# Patient Record
Sex: Female | Born: 1965 | Hispanic: Yes | Marital: Married | Smoking: Never smoker
Health system: Southern US, Community
[De-identification: ages and names within clinical notes are randomized; demographics above are authoritative.]

## PROBLEM LIST (undated history)

## (undated) DIAGNOSIS — R6882 Decreased libido: Secondary | ICD-10-CM

## (undated) DIAGNOSIS — D649 Anemia, unspecified: Secondary | ICD-10-CM

## (undated) DIAGNOSIS — Z5189 Encounter for other specified aftercare: Secondary | ICD-10-CM

## (undated) DIAGNOSIS — E785 Hyperlipidemia, unspecified: Secondary | ICD-10-CM

## (undated) DIAGNOSIS — R42 Dizziness and giddiness: Secondary | ICD-10-CM

## (undated) DIAGNOSIS — J309 Allergic rhinitis, unspecified: Secondary | ICD-10-CM

## (undated) DIAGNOSIS — G4700 Insomnia, unspecified: Secondary | ICD-10-CM

## (undated) DIAGNOSIS — E119 Type 2 diabetes mellitus without complications: Secondary | ICD-10-CM

## (undated) DIAGNOSIS — G43009 Migraine without aura, not intractable, without status migrainosus: Secondary | ICD-10-CM

## (undated) DIAGNOSIS — J45909 Unspecified asthma, uncomplicated: Secondary | ICD-10-CM

## (undated) DIAGNOSIS — K219 Gastro-esophageal reflux disease without esophagitis: Secondary | ICD-10-CM

## (undated) HISTORY — PX: CYST EXCISION: SHX5701

## (undated) HISTORY — DX: Dizziness and giddiness: R42

## (undated) HISTORY — DX: Insomnia, unspecified: G47.00

## (undated) HISTORY — DX: Encounter for other specified aftercare: Z51.89

## (undated) HISTORY — DX: Decreased libido: R68.82

## (undated) HISTORY — DX: Anemia, unspecified: D64.9

## (undated) HISTORY — DX: Migraine without aura, not intractable, without status migrainosus: G43.009

## (undated) HISTORY — DX: Allergic rhinitis, unspecified: J30.9

## (undated) HISTORY — DX: Hyperlipidemia, unspecified: E78.5

## (undated) HISTORY — DX: Gastro-esophageal reflux disease without esophagitis: K21.9

## (undated) HISTORY — DX: Unspecified asthma, uncomplicated: J45.909

## (undated) HISTORY — DX: Type 2 diabetes mellitus without complications: E11.9

---

## 2007-01-10 ENCOUNTER — Emergency Department (HOSPITAL_COMMUNITY): Admission: EM | Admit: 2007-01-10 | Discharge: 2007-01-10 | Payer: Self-pay | Admitting: Emergency Medicine

## 2007-03-24 ENCOUNTER — Emergency Department (HOSPITAL_COMMUNITY): Admission: EM | Admit: 2007-03-24 | Discharge: 2007-03-24 | Payer: Self-pay | Admitting: Emergency Medicine

## 2007-05-29 ENCOUNTER — Other Ambulatory Visit: Admission: RE | Admit: 2007-05-29 | Discharge: 2007-05-29 | Payer: Self-pay | Admitting: Gynecology

## 2007-05-31 LAB — CONVERTED CEMR LAB: Pap Smear: NORMAL

## 2007-06-19 ENCOUNTER — Encounter (INDEPENDENT_AMBULATORY_CARE_PROVIDER_SITE_OTHER): Payer: Self-pay | Admitting: *Deleted

## 2007-06-19 ENCOUNTER — Ambulatory Visit (HOSPITAL_COMMUNITY): Admission: RE | Admit: 2007-06-19 | Discharge: 2007-06-19 | Payer: Self-pay | Admitting: *Deleted

## 2007-06-21 ENCOUNTER — Encounter: Admission: RE | Admit: 2007-06-21 | Discharge: 2007-06-21 | Payer: Self-pay | Admitting: Gynecology

## 2007-07-27 ENCOUNTER — Ambulatory Visit: Payer: Self-pay | Admitting: Internal Medicine

## 2007-07-27 LAB — CONVERTED CEMR LAB
ALT: 18 units/L (ref 0–35)
AST: 21 units/L (ref 0–37)
Albumin: 4.1 g/dL (ref 3.5–5.2)
Alkaline Phosphatase: 51 units/L (ref 39–117)
BUN: 10 mg/dL (ref 6–23)
Basophils Absolute: 0 10*3/uL (ref 0.0–0.1)
Basophils Relative: 0 % (ref 0.0–1.0)
Bilirubin Urine: NEGATIVE
Bilirubin, Direct: 0.1 mg/dL (ref 0.0–0.3)
CO2: 29 meq/L (ref 19–32)
Calcium: 9.3 mg/dL (ref 8.4–10.5)
Chloride: 104 meq/L (ref 96–112)
Cholesterol: 208 mg/dL (ref 0–200)
Creatinine, Ser: 0.6 mg/dL (ref 0.4–1.2)
Crystals: NEGATIVE
Direct LDL: 138.6 mg/dL
Eosinophils Absolute: 0.1 10*3/uL (ref 0.0–0.6)
Eosinophils Relative: 1.7 % (ref 0.0–5.0)
GFR calc Af Amer: 140 mL/min
GFR calc non Af Amer: 116 mL/min
Glucose, Bld: 122 mg/dL — ABNORMAL HIGH (ref 70–99)
HCT: 35 % — ABNORMAL LOW (ref 36.0–46.0)
HDL: 45.5 mg/dL (ref >39.0)
Hemoglobin, Urine: NEGATIVE
Hemoglobin: 11.6 g/dL — ABNORMAL LOW (ref 12.0–15.0)
Ketones, ur: NEGATIVE mg/dL
Lymphocytes Relative: 54.3 % — ABNORMAL HIGH (ref 12.0–46.0)
MCHC: 33 g/dL (ref 30.0–36.0)
MCV: 80.7 fL (ref 78.0–100.0)
Monocytes Absolute: 0.7 10*3/uL (ref 0.2–0.7)
Monocytes Relative: 9.6 % (ref 3.0–11.0)
Mucus, UA: NEGATIVE
Neutro Abs: 2.5 10*3/uL (ref 1.4–7.7)
Neutrophils Relative %: 34.4 % — ABNORMAL LOW (ref 43.0–77.0)
Nitrite: NEGATIVE
Platelets: 363 10*3/uL (ref 150–400)
Potassium: 3.8 meq/L (ref 3.5–5.1)
RBC: 4.34 M/uL (ref 3.87–5.11)
RDW: 12.9 % (ref 11.5–14.6)
Sodium: 139 meq/L (ref 135–145)
Specific Gravity, Urine: 1.02 (ref 1.000–1.03)
TSH: 0.82 microintl units/mL (ref 0.35–5.50)
Total Bilirubin: 1 mg/dL (ref 0.3–1.2)
Total CHOL/HDL Ratio: 4.6
Total Protein, Urine: NEGATIVE mg/dL
Total Protein: 7.1 g/dL (ref 6.0–8.3)
Triglycerides: 95 mg/dL (ref 0–149)
Urine Glucose: NEGATIVE mg/dL
Urobilinogen, UA: 0.2 (ref 0.0–1.0)
VLDL: 19 mg/dL (ref 0–40)
WBC: 7.2 10*3/uL (ref 4.5–10.5)
pH: 6 (ref 5.0–8.0)

## 2007-08-02 ENCOUNTER — Ambulatory Visit: Payer: Self-pay | Admitting: Internal Medicine

## 2007-08-02 DIAGNOSIS — N390 Urinary tract infection, site not specified: Secondary | ICD-10-CM | POA: Insufficient documentation

## 2007-08-02 DIAGNOSIS — K219 Gastro-esophageal reflux disease without esophagitis: Secondary | ICD-10-CM

## 2007-08-02 DIAGNOSIS — Z794 Long term (current) use of insulin: Secondary | ICD-10-CM

## 2007-08-02 DIAGNOSIS — J45909 Unspecified asthma, uncomplicated: Secondary | ICD-10-CM

## 2007-08-02 DIAGNOSIS — E785 Hyperlipidemia, unspecified: Secondary | ICD-10-CM

## 2007-08-02 DIAGNOSIS — E119 Type 2 diabetes mellitus without complications: Secondary | ICD-10-CM

## 2007-08-02 DIAGNOSIS — E1165 Type 2 diabetes mellitus with hyperglycemia: Secondary | ICD-10-CM

## 2007-08-02 HISTORY — DX: Unspecified asthma, uncomplicated: J45.909

## 2007-08-02 HISTORY — DX: Type 2 diabetes mellitus with hyperglycemia: E11.65

## 2007-08-02 HISTORY — DX: Hyperlipidemia, unspecified: E78.5

## 2007-08-02 HISTORY — DX: Gastro-esophageal reflux disease without esophagitis: K21.9

## 2007-08-02 HISTORY — DX: Type 2 diabetes mellitus without complications: E11.9

## 2007-08-02 HISTORY — DX: Long term (current) use of insulin: Z79.4

## 2007-08-22 ENCOUNTER — Emergency Department (HOSPITAL_COMMUNITY): Admission: EM | Admit: 2007-08-22 | Discharge: 2007-08-22 | Payer: Self-pay | Admitting: Emergency Medicine

## 2007-09-29 ENCOUNTER — Ambulatory Visit: Payer: Self-pay | Admitting: Internal Medicine

## 2007-09-29 LAB — CONVERTED CEMR LAB
ALT: 20 units/L (ref 0–35)
AST: 20 units/L (ref 0–37)
Cholesterol: 191 mg/dL (ref 0–200)
Creatinine,U: 82.1 mg/dL
HDL: 43.3 mg/dL (ref >39.0)
Hgb A1c MFr Bld: 6.8 % — ABNORMAL HIGH (ref 4.6–6.0)
LDL Cholesterol: 129 mg/dL — ABNORMAL HIGH (ref 0–99)
Microalb Creat Ratio: 9.7 mg/g (ref 0.0–30.0)
Microalb, Ur: 0.8 mg/dL (ref 0.0–1.9)
Total CHOL/HDL Ratio: 4.4
Total CK: 52 units/L (ref 7–177)
Triglycerides: 94 mg/dL (ref 0–149)
VLDL: 19 mg/dL (ref 0–40)

## 2007-10-04 ENCOUNTER — Ambulatory Visit: Payer: Self-pay | Admitting: Internal Medicine

## 2007-10-04 DIAGNOSIS — J309 Allergic rhinitis, unspecified: Secondary | ICD-10-CM | POA: Insufficient documentation

## 2007-10-04 HISTORY — DX: Allergic rhinitis, unspecified: J30.9

## 2007-11-10 ENCOUNTER — Emergency Department (HOSPITAL_COMMUNITY): Admission: EM | Admit: 2007-11-10 | Discharge: 2007-11-10 | Payer: Self-pay | Admitting: Emergency Medicine

## 2008-01-02 ENCOUNTER — Ambulatory Visit: Payer: Self-pay | Admitting: Internal Medicine

## 2008-01-02 LAB — CONVERTED CEMR LAB
ALT: 15 units/L (ref 0–35)
AST: 16 units/L (ref 0–37)
Cholesterol: 144 mg/dL (ref 0–200)
HDL: 41.2 mg/dL (ref >39.0)
LDL Cholesterol: 84 mg/dL (ref 0–99)
Total CHOL/HDL Ratio: 3.5
Triglycerides: 95 mg/dL (ref 0–149)
VLDL: 19 mg/dL (ref 0–40)

## 2008-01-04 ENCOUNTER — Ambulatory Visit: Payer: Self-pay | Admitting: Internal Medicine

## 2008-01-04 DIAGNOSIS — G4700 Insomnia, unspecified: Secondary | ICD-10-CM | POA: Insufficient documentation

## 2008-01-04 HISTORY — DX: Insomnia, unspecified: G47.00

## 2008-01-08 LAB — CONVERTED CEMR LAB
BUN: 10 mg/dL (ref 6–23)
CO2: 30 meq/L (ref 19–32)
Calcium: 9.2 mg/dL (ref 8.4–10.5)
Chloride: 106 meq/L (ref 96–112)
Creatinine, Ser: 0.6 mg/dL (ref 0.4–1.2)
GFR calc Af Amer: 140 mL/min
GFR calc non Af Amer: 116 mL/min
Glucose, Bld: 117 mg/dL — ABNORMAL HIGH (ref 70–99)
Hgb A1c MFr Bld: 7.1 % — ABNORMAL HIGH (ref 4.6–6.0)
Potassium: 3.8 meq/L (ref 3.5–5.1)
Sodium: 140 meq/L (ref 135–145)

## 2008-06-10 ENCOUNTER — Other Ambulatory Visit: Admission: RE | Admit: 2008-06-10 | Discharge: 2008-06-10 | Payer: Self-pay | Admitting: Gynecology

## 2008-06-10 LAB — CONVERTED CEMR LAB: Pap Smear: NORMAL

## 2008-06-21 ENCOUNTER — Emergency Department (HOSPITAL_COMMUNITY): Admission: EM | Admit: 2008-06-21 | Discharge: 2008-06-21 | Payer: Self-pay | Admitting: Emergency Medicine

## 2008-06-21 ENCOUNTER — Encounter: Admission: RE | Admit: 2008-06-21 | Discharge: 2008-06-21 | Payer: Self-pay | Admitting: Internal Medicine

## 2008-07-05 ENCOUNTER — Ambulatory Visit: Payer: Self-pay | Admitting: Internal Medicine

## 2008-07-05 LAB — CONVERTED CEMR LAB
ALT: 15 units/L (ref 0–35)
AST: 17 units/L (ref 0–37)
Albumin: 4 g/dL (ref 3.5–5.2)
Alkaline Phosphatase: 61 units/L (ref 39–117)
BUN: 10 mg/dL (ref 6–23)
Basophils Absolute: 0 10*3/uL (ref 0.0–0.1)
Basophils Relative: 0 % (ref 0.0–3.0)
Bilirubin Urine: NEGATIVE
Bilirubin, Direct: 0.1 mg/dL (ref 0.0–0.3)
CO2: 29 meq/L (ref 19–32)
Calcium: 9.4 mg/dL (ref 8.4–10.5)
Chloride: 105 meq/L (ref 96–112)
Cholesterol: 182 mg/dL (ref 0–200)
Creatinine, Ser: 0.6 mg/dL (ref 0.4–1.2)
Creatinine,U: 109.3 mg/dL
Eosinophils Absolute: 0.1 10*3/uL (ref 0.0–0.7)
Eosinophils Relative: 1.7 % (ref 0.0–5.0)
GFR calc Af Amer: 140 mL/min
GFR calc non Af Amer: 115 mL/min
Glucose, Bld: 129 mg/dL — ABNORMAL HIGH (ref 70–99)
HCT: 35 % — ABNORMAL LOW (ref 36.0–46.0)
HDL: 44.2 mg/dL (ref >39.0)
Hemoglobin, Urine: NEGATIVE
Hemoglobin: 11.6 g/dL — ABNORMAL LOW (ref 12.0–15.0)
Hgb A1c MFr Bld: 7.3 % — ABNORMAL HIGH (ref 4.6–6.0)
Iron: 73 ug/dL (ref 42–145)
Ketones, ur: NEGATIVE mg/dL
LDL Cholesterol: 118 mg/dL — ABNORMAL HIGH (ref 0–99)
Leukocytes, UA: NEGATIVE
Lymphocytes Relative: 53.2 % — ABNORMAL HIGH (ref 12.0–46.0)
MCHC: 33.1 g/dL (ref 30.0–36.0)
MCV: 81.1 fL (ref 78.0–100.0)
Microalb Creat Ratio: 6.4 mg/g (ref 0.0–30.0)
Microalb, Ur: 0.7 mg/dL (ref 0.0–1.9)
Monocytes Absolute: 0.7 10*3/uL (ref 0.1–1.0)
Monocytes Relative: 8.2 % (ref 3.0–12.0)
Neutro Abs: 3 10*3/uL (ref 1.4–7.7)
Neutrophils Relative %: 36.9 % — ABNORMAL LOW (ref 43.0–77.0)
Nitrite: NEGATIVE
Platelets: 358 10*3/uL (ref 150–400)
Potassium: 4 meq/L (ref 3.5–5.1)
RBC: 4.31 M/uL (ref 3.87–5.11)
RDW: 13.3 % (ref 11.5–14.6)
Saturation Ratios: 19.1 % — ABNORMAL LOW (ref 20.0–50.0)
Sodium: 140 meq/L (ref 135–145)
Specific Gravity, Urine: 1.01 (ref 1.000–1.03)
TSH: 1.93 microintl units/mL (ref 0.35–5.50)
Total Bilirubin: 1 mg/dL (ref 0.3–1.2)
Total CHOL/HDL Ratio: 4.1
Total Protein, Urine: NEGATIVE mg/dL
Total Protein: 6.9 g/dL (ref 6.0–8.3)
Transferrin: 273.4 mg/dL (ref >=212.0)
Triglycerides: 98 mg/dL (ref 0–149)
Urine Glucose: NEGATIVE mg/dL
Urobilinogen, UA: 0.2 (ref 0.0–1.0)
VLDL: 20 mg/dL (ref 0–40)
WBC: 8 10*3/uL (ref 4.5–10.5)
pH: 6 (ref 5.0–8.0)

## 2008-07-08 ENCOUNTER — Ambulatory Visit: Payer: Self-pay | Admitting: Internal Medicine

## 2008-07-12 ENCOUNTER — Encounter: Payer: Self-pay | Admitting: Internal Medicine

## 2008-11-27 ENCOUNTER — Emergency Department (HOSPITAL_COMMUNITY): Admission: EM | Admit: 2008-11-27 | Discharge: 2008-11-27 | Payer: Self-pay | Admitting: Family Medicine

## 2009-01-02 ENCOUNTER — Ambulatory Visit: Payer: Self-pay | Admitting: Internal Medicine

## 2009-01-02 LAB — CONVERTED CEMR LAB
BUN: 12 mg/dL (ref 6–23)
CO2: 30 meq/L (ref 19–32)
Calcium: 9 mg/dL (ref 8.4–10.5)
Chloride: 106 meq/L (ref 96–112)
Cholesterol: 240 mg/dL — ABNORMAL HIGH (ref 0–200)
Creatinine, Ser: 0.6 mg/dL (ref 0.4–1.2)
Direct LDL: 175.2 mg/dL
GFR calc non Af Amer: 115.1 mL/min (ref >60)
Glucose, Bld: 132 mg/dL — ABNORMAL HIGH (ref 70–99)
HDL: 41.7 mg/dL (ref >39.00)
Hgb A1c MFr Bld: 7.5 % — ABNORMAL HIGH (ref 4.6–6.5)
Potassium: 3.9 meq/L (ref 3.5–5.1)
Sodium: 141 meq/L (ref 135–145)
Total CHOL/HDL Ratio: 6
Triglycerides: 149 mg/dL (ref 0.0–149.0)
VLDL: 29.8 mg/dL (ref 0.0–40.0)

## 2009-01-06 ENCOUNTER — Ambulatory Visit: Payer: Self-pay | Admitting: Internal Medicine

## 2009-01-06 LAB — CONVERTED CEMR LAB
Bilirubin Urine: NEGATIVE
Hemoglobin, Urine: NEGATIVE
Ketones, ur: NEGATIVE mg/dL
Leukocytes, UA: NEGATIVE
Nitrite: NEGATIVE
Specific Gravity, Urine: 1.025 (ref 1.000–1.030)
Total Protein, Urine: NEGATIVE mg/dL
Urine Glucose: NEGATIVE mg/dL
Urobilinogen, UA: 0.2 (ref 0.0–1.0)
pH: 6 (ref 5.0–8.0)

## 2009-03-04 ENCOUNTER — Emergency Department (HOSPITAL_COMMUNITY): Admission: EM | Admit: 2009-03-04 | Discharge: 2009-03-05 | Payer: Self-pay | Admitting: Emergency Medicine

## 2009-03-10 ENCOUNTER — Telehealth: Payer: Self-pay | Admitting: Internal Medicine

## 2009-05-14 ENCOUNTER — Emergency Department (HOSPITAL_COMMUNITY): Admission: EM | Admit: 2009-05-14 | Discharge: 2009-05-14 | Payer: Self-pay | Admitting: Family Medicine

## 2009-05-15 ENCOUNTER — Telehealth: Payer: Self-pay | Admitting: Internal Medicine

## 2009-05-15 ENCOUNTER — Ambulatory Visit: Payer: Self-pay | Admitting: Internal Medicine

## 2009-05-15 DIAGNOSIS — R42 Dizziness and giddiness: Secondary | ICD-10-CM

## 2009-05-15 DIAGNOSIS — G43009 Migraine without aura, not intractable, without status migrainosus: Secondary | ICD-10-CM

## 2009-05-15 DIAGNOSIS — G43709 Chronic migraine without aura, not intractable, without status migrainosus: Secondary | ICD-10-CM | POA: Insufficient documentation

## 2009-05-15 DIAGNOSIS — H6690 Otitis media, unspecified, unspecified ear: Secondary | ICD-10-CM | POA: Insufficient documentation

## 2009-05-15 HISTORY — DX: Migraine without aura, not intractable, without status migrainosus: G43.009

## 2009-05-15 HISTORY — DX: Dizziness and giddiness: R42

## 2009-06-23 ENCOUNTER — Encounter: Admission: RE | Admit: 2009-06-23 | Discharge: 2009-06-23 | Payer: Self-pay | Admitting: Internal Medicine

## 2009-06-23 LAB — HM MAMMOGRAPHY

## 2009-07-07 ENCOUNTER — Ambulatory Visit: Payer: Self-pay | Admitting: Internal Medicine

## 2009-07-07 LAB — CONVERTED CEMR LAB
Creatinine,U: 137.8 mg/dL
Hgb A1c MFr Bld: 7.7 % — ABNORMAL HIGH (ref 4.6–6.5)
Microalb Creat Ratio: 3.6 mg/g (ref 0.0–30.0)
Microalb, Ur: 0.5 mg/dL (ref 0.0–1.9)

## 2009-07-08 ENCOUNTER — Ambulatory Visit: Payer: Self-pay | Admitting: Internal Medicine

## 2009-07-09 LAB — CONVERTED CEMR LAB
ALT: 28 units/L (ref 0–35)
AST: 53 units/L — ABNORMAL HIGH (ref 0–37)
Albumin: 4.1 g/dL (ref 3.5–5.2)
Alkaline Phosphatase: 65 units/L (ref 39–117)
BUN: 8 mg/dL (ref 6–23)
Basophils Absolute: 0 10*3/uL (ref 0.0–0.1)
Basophils Relative: 0.5 % (ref 0.0–3.0)
Bilirubin Urine: NEGATIVE
Bilirubin, Direct: 0.1 mg/dL (ref 0.0–0.3)
CO2: 29 meq/L (ref 19–32)
Calcium: 9.8 mg/dL (ref 8.4–10.5)
Chloride: 106 meq/L (ref 96–112)
Cholesterol: 168 mg/dL (ref 0–200)
Creatinine, Ser: 0.7 mg/dL (ref 0.4–1.2)
Eosinophils Absolute: 0.1 10*3/uL (ref 0.0–0.7)
Eosinophils Relative: 1.4 % (ref 0.0–5.0)
GFR calc non Af Amer: 96.12 mL/min (ref >60)
Glucose, Bld: 140 mg/dL — ABNORMAL HIGH (ref 70–99)
HCT: 35.7 % — ABNORMAL LOW (ref 36.0–46.0)
HDL: 44.9 mg/dL (ref >39.00)
Hemoglobin, Urine: NEGATIVE
Hemoglobin: 11.4 g/dL — ABNORMAL LOW (ref 12.0–15.0)
Ketones, ur: NEGATIVE mg/dL
LDL Cholesterol: 97 mg/dL (ref 0–99)
Lymphocytes Relative: 52.5 % — ABNORMAL HIGH (ref 12.0–46.0)
Lymphs Abs: 3.9 10*3/uL (ref 0.7–4.0)
MCHC: 32.1 g/dL (ref 30.0–36.0)
MCV: 81.6 fL (ref 78.0–100.0)
Monocytes Absolute: 0.7 10*3/uL (ref 0.1–1.0)
Monocytes Relative: 9.7 % (ref 3.0–12.0)
Neutro Abs: 2.6 10*3/uL (ref 1.4–7.7)
Neutrophils Relative %: 35.9 % — ABNORMAL LOW (ref 43.0–77.0)
Nitrite: NEGATIVE
Platelets: 344 10*3/uL (ref 150.0–400.0)
Potassium: 4 meq/L (ref 3.5–5.1)
RBC: 4.37 M/uL (ref 3.87–5.11)
RDW: 13.5 % (ref 11.5–14.6)
Sodium: 141 meq/L (ref 135–145)
Specific Gravity, Urine: 1.01 (ref 1.000–1.030)
TSH: 1.08 microintl units/mL (ref 0.35–5.50)
Total Bilirubin: 0.9 mg/dL (ref 0.3–1.2)
Total CHOL/HDL Ratio: 4
Total Protein, Urine: NEGATIVE mg/dL
Total Protein: 7.5 g/dL (ref 6.0–8.3)
Triglycerides: 129 mg/dL (ref 0.0–149.0)
Urine Glucose: NEGATIVE mg/dL
Urobilinogen, UA: 0.2 (ref 0.0–1.0)
VLDL: 25.8 mg/dL (ref 0.0–40.0)
WBC: 7.3 10*3/uL (ref 4.5–10.5)
pH: 7 (ref 5.0–8.0)

## 2009-07-10 ENCOUNTER — Telehealth: Payer: Self-pay | Admitting: Internal Medicine

## 2009-07-10 LAB — CONVERTED CEMR LAB
Folate: 6.1 ng/mL
Iron: 75 ug/dL (ref 42–145)
Saturation Ratios: 19.2 % — ABNORMAL LOW (ref 20.0–50.0)
Transferrin: 279.2 mg/dL (ref 212.0–360.0)
Vitamin B-12: 366 pg/mL (ref 211–911)

## 2009-07-23 ENCOUNTER — Inpatient Hospital Stay (HOSPITAL_COMMUNITY): Admission: EM | Admit: 2009-07-23 | Discharge: 2009-07-24 | Payer: Self-pay | Admitting: Emergency Medicine

## 2009-07-23 ENCOUNTER — Telehealth: Payer: Self-pay | Admitting: Internal Medicine

## 2009-07-31 ENCOUNTER — Encounter: Payer: Self-pay | Admitting: Internal Medicine

## 2009-08-21 ENCOUNTER — Emergency Department (HOSPITAL_COMMUNITY): Admission: EM | Admit: 2009-08-21 | Discharge: 2009-08-21 | Payer: Self-pay | Admitting: Family Medicine

## 2009-09-08 ENCOUNTER — Encounter: Admission: RE | Admit: 2009-09-08 | Discharge: 2009-12-07 | Payer: Self-pay | Admitting: Occupational Medicine

## 2009-12-19 ENCOUNTER — Ambulatory Visit (HOSPITAL_COMMUNITY): Admission: RE | Admit: 2009-12-19 | Discharge: 2009-12-19 | Payer: Self-pay | Admitting: Orthopedic Surgery

## 2010-01-02 ENCOUNTER — Ambulatory Visit: Payer: Self-pay | Admitting: Internal Medicine

## 2010-01-02 LAB — CONVERTED CEMR LAB
BUN: 15 mg/dL (ref 6–23)
CO2: 24 meq/L (ref 19–32)
Calcium: 9.6 mg/dL (ref 8.4–10.5)
Chloride: 103 meq/L (ref 96–112)
Cholesterol: 248 mg/dL — ABNORMAL HIGH (ref 0–200)
Creatinine, Ser: 0.6 mg/dL (ref 0.4–1.2)
Direct LDL: 176 mg/dL
GFR calc non Af Amer: 106.36 mL/min (ref >60)
Glucose, Bld: 191 mg/dL — ABNORMAL HIGH (ref 70–99)
HDL: 57.1 mg/dL (ref >39.00)
Hgb A1c MFr Bld: 9.1 % — ABNORMAL HIGH (ref 4.6–6.5)
Potassium: 4.5 meq/L (ref 3.5–5.1)
Sodium: 137 meq/L (ref 135–145)
Total CHOL/HDL Ratio: 4
Triglycerides: 60 mg/dL (ref 0.0–149.0)
VLDL: 12 mg/dL (ref 0.0–40.0)

## 2010-01-09 ENCOUNTER — Ambulatory Visit: Payer: Self-pay | Admitting: Internal Medicine

## 2010-01-09 DIAGNOSIS — R6882 Decreased libido: Secondary | ICD-10-CM | POA: Insufficient documentation

## 2010-01-09 DIAGNOSIS — F329 Major depressive disorder, single episode, unspecified: Secondary | ICD-10-CM

## 2010-01-09 DIAGNOSIS — F32A Depression, unspecified: Secondary | ICD-10-CM

## 2010-01-09 HISTORY — DX: Decreased libido: R68.82

## 2010-01-09 HISTORY — DX: Depression, unspecified: F32.A

## 2010-03-11 ENCOUNTER — Ambulatory Visit: Payer: Self-pay | Admitting: Internal Medicine

## 2010-04-03 ENCOUNTER — Telehealth: Payer: Self-pay | Admitting: Internal Medicine

## 2010-04-21 ENCOUNTER — Emergency Department (HOSPITAL_COMMUNITY): Admission: EM | Admit: 2010-04-21 | Discharge: 2010-04-21 | Payer: Self-pay | Admitting: Family Medicine

## 2010-04-29 ENCOUNTER — Encounter: Payer: Self-pay | Admitting: Internal Medicine

## 2010-04-29 ENCOUNTER — Encounter
Admission: RE | Admit: 2010-04-29 | Discharge: 2010-07-14 | Payer: Self-pay | Source: Home / Self Care | Attending: Internal Medicine | Admitting: Internal Medicine

## 2010-06-24 ENCOUNTER — Encounter
Admission: RE | Admit: 2010-06-24 | Discharge: 2010-06-24 | Payer: Self-pay | Source: Home / Self Care | Attending: Obstetrics and Gynecology | Admitting: Obstetrics and Gynecology

## 2010-07-13 ENCOUNTER — Other Ambulatory Visit: Payer: Self-pay | Admitting: Internal Medicine

## 2010-07-13 ENCOUNTER — Ambulatory Visit
Admission: RE | Admit: 2010-07-13 | Discharge: 2010-07-13 | Payer: Self-pay | Source: Home / Self Care | Attending: Internal Medicine | Admitting: Internal Medicine

## 2010-07-13 LAB — BASIC METABOLIC PANEL
BUN: 10 mg/dL (ref 6–23)
CO2: 27 mEq/L (ref 19–32)
Calcium: 9.1 mg/dL (ref 8.4–10.5)
Chloride: 99 mEq/L (ref 96–112)
Creatinine, Ser: 0.5 mg/dL (ref 0.4–1.2)
GFR: 137.9 mL/min (ref >60.00)
Glucose, Bld: 164 mg/dL — ABNORMAL HIGH (ref 70–99)
Potassium: 4.3 mEq/L (ref 3.5–5.1)
Sodium: 135 mEq/L (ref 135–145)

## 2010-07-13 LAB — HEPATIC FUNCTION PANEL
ALT: 17 U/L (ref 0–35)
AST: 18 U/L (ref 0–37)
Albumin: 4 g/dL (ref 3.5–5.2)
Alkaline Phosphatase: 67 U/L (ref 39–117)
Bilirubin, Direct: 0.1 mg/dL (ref 0.0–0.3)
Total Bilirubin: 0.9 mg/dL (ref 0.3–1.2)
Total Protein: 7.2 g/dL (ref 6.0–8.3)

## 2010-07-13 LAB — CBC WITH DIFFERENTIAL/PLATELET
Basophils Absolute: 0.1 10*3/uL (ref 0.0–0.1)
Basophils Relative: 0.7 % (ref 0.0–3.0)
Eosinophils Absolute: 0.1 10*3/uL (ref 0.0–0.7)
Eosinophils Relative: 1.1 % (ref 0.0–5.0)
HCT: 34 % — ABNORMAL LOW (ref 36.0–46.0)
Hemoglobin: 11.4 g/dL — ABNORMAL LOW (ref 12.0–15.0)
Lymphocytes Relative: 50.5 % — ABNORMAL HIGH (ref 12.0–46.0)
Lymphs Abs: 4.2 10*3/uL — ABNORMAL HIGH (ref 0.7–4.0)
MCHC: 33.5 g/dL (ref 30.0–36.0)
MCV: 78.9 fl (ref 78.0–100.0)
Monocytes Absolute: 0.6 10*3/uL (ref 0.1–1.0)
Monocytes Relative: 7.3 % (ref 3.0–12.0)
Neutro Abs: 3.4 10*3/uL (ref 1.4–7.7)
Neutrophils Relative %: 40.4 % — ABNORMAL LOW (ref 43.0–77.0)
Platelets: 393 10*3/uL (ref 150.0–400.0)
RBC: 4.31 Mil/uL (ref 3.87–5.11)
RDW: 14.3 % (ref 11.5–14.6)
WBC: 8.4 10*3/uL (ref 4.5–10.5)

## 2010-07-13 LAB — TSH: TSH: 1.2 u[IU]/mL (ref 0.35–5.50)

## 2010-07-13 LAB — URINALYSIS
Bilirubin Urine: NEGATIVE
Hemoglobin, Urine: NEGATIVE
Ketones, ur: NEGATIVE
Leukocytes, UA: NEGATIVE
Nitrite: NEGATIVE
Specific Gravity, Urine: 1.015 (ref 1.000–1.030)
Total Protein, Urine: NEGATIVE
Urine Glucose: 100
Urobilinogen, UA: 0.2 (ref 0.0–1.0)
pH: 7.5 (ref 5.0–8.0)

## 2010-07-13 LAB — LIPID PANEL
Cholesterol: 205 mg/dL — ABNORMAL HIGH (ref 0–200)
HDL: 46.4 mg/dL (ref >39.00)
Total CHOL/HDL Ratio: 4
Triglycerides: 156 mg/dL — ABNORMAL HIGH (ref 0.0–149.0)
VLDL: 31.2 mg/dL (ref 0.0–40.0)

## 2010-07-13 LAB — LDL CHOLESTEROL, DIRECT: Direct LDL: 139.8 mg/dL

## 2010-07-13 LAB — HEMOGLOBIN A1C: Hgb A1c MFr Bld: 9.1 % — ABNORMAL HIGH (ref 4.6–6.5)

## 2010-07-13 LAB — MICROALBUMIN / CREATININE URINE RATIO
Creatinine,U: 64.1 mg/dL
Microalb Creat Ratio: 0.5 mg/g (ref 0.0–30.0)
Microalb, Ur: 0.3 mg/dL (ref 0.0–1.9)

## 2010-07-15 NOTE — Letter (Signed)
Summary: [facility]   Imported By: Lester Lemont 05/05/2010 08:29:15  _____________________________________________________________________  External Attachment:    Type:   Image     Comment:   External Document

## 2010-07-15 NOTE — Progress Notes (Signed)
Summary: Wellness Program  Phone Note Outgoing Call   Call placed by: Zella Ball Summary of Call: Patient has voluntarily enrolled in [facility] Program through [facility] for its employees. Patient will meet one on one with Dr. Gerilyn Pilgrim for nutritional counseling, but they need written order to ok this from pts. PCP. Also to remove any financial barriers for these pts. they are supplying them with their testing supplies. They need a prescription for True Result Glucometer, True Test Strips and lancets sent to [facility].  Initial call taken by: Robin Ewing CMA Duncan Dull),  April 03, 2010 11:42 AM  Follow-up for Phone Call        the rx need to be done by hand apparently since "true result" is not in the system to send escript - robin to handle  referral to dr Gerilyn Pilgrim done Follow-up by: Corwin Levins MD,  April 03, 2010 1:17 PM    New/Updated Medications: TRUERESULT BLOOD GLUCOSE W/DEVICE KIT (BLOOD GLUCOSE MONITORING SUPPL) use as directed TRUETEST TEST  STRP (GLUCOSE BLOOD) use as directed Prescriptions: TRUETEST TEST  STRP (GLUCOSE BLOOD) use as directed  #100 x 4   Entered by:   Scharlene Gloss CMA (AAMA)   Authorized by:   Corwin Levins MD   Signed by:   Scharlene Gloss CMA (AAMA) on 04/03/2010   Method used:   Faxed to ...       [facility]* (retail)       [address]       [address]       [place], [place]  [address]       Ph: [phone]       Fax: [phone]   RxID:   [id number] TRUERESULT BLOOD GLUCOSE W/DEVICE KIT (BLOOD GLUCOSE MONITORING SUPPL) use as directed  #1 x 0   Entered by:   Scharlene Gloss CMA (AAMA)   Authorized by:   Corwin Levins MD   Signed by:   Scharlene Gloss CMA (AAMA) on 04/03/2010   Method used:   Faxed to ...       [facility]* (retail)       [address]       [address]mail[address]       [place], [place]  [address]       Ph: [phone]       Fax: [phone]   RxID:    [id number]

## 2010-07-15 NOTE — Progress Notes (Signed)
Summary: Pharmacy?  Phone Note From Pharmacy   Caller: [facility]* Summary of Call: Pharmacy called to clarify Metformin dosage. Pt had previously been on ER by last OV was Rx'd regular. please advise Initial call taken by: Margaret Pyle, CMA,  July 10, 2009 11:19 AM  Follow-up for Phone Call        is there any difference in price for her?  if the plain metformin in cheaper, I would stay with that Follow-up by: Corwin Levins MD,  July 10, 2009 1:09 PM    New/Updated Medications: METFORMIN HCL 500 MG XR24H-TAB (METFORMIN HCL) 3 by mouth qam Prescriptions: METFORMIN HCL 500 MG XR24H-TAB (METFORMIN HCL) 3 by mouth qam  #270 x 3   Entered by:   Margaret Pyle, CMA   Authorized by:   Corwin Levins MD   Signed by:   Margaret Pyle, CMA on 07/10/2009   Method used:   Telephoned to ...       [facility]* (retail)       [address]       [address]       [place], [place]  [address]       Ph: [phone]       Fax: [phone]   RxID:   [id number] METFORMIN HCL 500 MG XR24H-TAB (METFORMIN HCL) 3 by mouth qam  #270 x 3   Entered by:   Margaret Pyle, CMA   Authorized by:   B LIM Triage Nurse   Signed by:   Margaret Pyle, CMA on 07/10/2009   Method used:   Telephoned to ...       [facility]* (retail)       [address]       [address]/mail[address]       [place], [place]  [address]       Ph: [phone]       Fax: [phone]   RxID:   [id number]

## 2010-07-15 NOTE — Assessment & Plan Note (Signed)
Summary: 2 MO ROV/ $50 /NWS   Vital Signs:  Patient Profile:   45 Years Old Female Height:     61 inches Weight:      143.38 pounds BMI:     27.19 Temp:     97.8 degrees F BP sitting:   94 / 72  (left arm)  Vitals Entered By: Glendell Docker (October 04, 2007 9:19 AM)                 Chief Complaint:  FOLLOW UP DISEASE MANAGEMENT and Type 2 diabetes mellitus follow-up.  History of Present Illness:  Type 2 Diabetes Mellitus Follow-Up      This is a 45 year old woman who presents for Type 2 diabetes mellitus follow-up.  The patient denies self managed hypoglycemia and hypoglycemia requiring help.  The patient denies the following symptoms: chest pain.  Since the last visit the patient reports good dietary compliance.  The patient has been measuring capillary blood glucose before breakfast.      Current Allergies (reviewed today): No known allergies   Past Medical History:    Reviewed history from 08/02/2007 and no changes required:       History of Asthma       Diabetes mellitus, type II       GERD       Hyperlipidemia     Review of Systems      See HPI   Physical Exam  General:     alert, well-developed, and well-nourished.   Head:     normocephalic and atraumatic.   Eyes:     vision grossly intact, pupils equal, and pupils round.   Ears:     R ear normal and L ear normal.   Nose:     nasal dischargemucosal pallor and mucosal edema.   Neck:     supple, no thyromegaly, and no carotid bruits.   Lungs:     normal respiratory effort and normal breath sounds.   Heart:     normal rate, regular rhythm, no murmur, and no gallop.   Extremities:     No lower extremity edema     Impression & Recommendations:  Problem # 1:  HYPERLIPIDEMIA (ICD-272.4) Pt tolerating simvastatin.  Increase to 40 mg.  Goal LDL <100. Her updated medication list for this problem includes:    Simvastatin 40 Mg Tabs (Simvastatin) ..... One by mouth qd  Labs Reviewed: Chol: 191  (09/29/2007)   HDL: 43.3 (09/29/2007)   LDL: 129 (09/29/2007)   TG: 94 (09/29/2007) SGOT: 20 (09/29/2007)   SGPT: 20 (09/29/2007)   Problem # 2:  DIABETES MELLITUS, TYPE II (ICD-250.00) A1c is acceptable.  Increase glucophage xr to 750 mg by mouth two times a day.  Her updated medication list for this problem includes:    Glucophage Xr 750 Mg Tb24 (Metformin hcl) ..... One by mouth two times a day  Labs Reviewed: HgBA1c: 6.8 (09/29/2007)   Creat: 0.6 (07/27/2007)      Problem # 3:  ALLERGIC RHINITIS (ICD-477.9) Pt reports poor response to claritin and zyrtec.  Her updated medication list for this problem includes:    Flonase 50 Mcg/act Susp (Fluticasone propionate) .Marland Kitchen... 2 sprays each nostril qd   Complete Medication List: 1)  Nexium 40 Mg Cpdr (Esomeprazole magnesium) .... Take 1 tablet by mouth once a day 2)  Glucophage Xr 750 Mg Tb24 (Metformin hcl) .... One by mouth two times a day 3)  Simvastatin 40 Mg Tabs (Simvastatin) .Marland KitchenMarland KitchenMarland Kitchen  One by mouth qd 4)  Cipro 250 Mg Tabs (Ciprofloxacin hcl) .... One by mouth bid 5)  Onetouch Ultra Test Strp (Glucose blood) .... For two times a day testing 6)  Flonase 50 Mcg/act Susp (Fluticasone propionate) .... 2 sprays each nostril qd   Patient Instructions: 1)  Please schedule a follow-up appointment in 3 months. 2)  Lipid Panel prior to visit, ICD-9:  272.4 3)  AST, ALT prior to visit, ICD-9:  272.4 4)  Please return for lab work one (1) week before your next appointment.  (Fasting)    Prescriptions: FLONASE 50 MCG/ACT  SUSP (FLUTICASONE PROPIONATE) 2 sprays each nostril qd  #1 bottle x 5   Entered and Authorized by:   D. Thomos Lemons DO   Signed by:   D. Thomos Lemons DO on 10/04/2007   Method used:   Electronically sent to ...       [facility]*       [address]       [address] Shipping/mailing       [place], [place]  [address]       Ph: [phone]       Fax: [phone]   RxID:   [id number] ONETOUCH  ULTRA TEST   STRP (GLUCOSE BLOOD) for two times a day testing  #100 x 5   Entered and Authorized by:   D. Thomos Lemons DO   Signed by:   D. Thomos Lemons DO on 10/04/2007   Method used:   Electronically sent to ...       [facility]*       [address]       [address] Shipping/mailing       [place], [place]  [address]       Ph: [phone]       Fax: [phone]   RxID:   [id number] SIMVASTATIN 40 MG  TABS (SIMVASTATIN) one by mouth qd  #30 x 5   Entered and Authorized by:   D. Thomos Lemons DO   Signed by:   D. Thomos Lemons DO on 10/04/2007   Method used:   Electronically sent to ...       [facility]*       [address]       [address] Shipping/mailing       [place], [place]  [address]       Ph: [phone]       Fax: [phone]   RxID:   [id number] GLUCOPHAGE XR 750 MG  TB24 (METFORMIN HCL) one by mouth two times a day  #60 x 5   Entered and Authorized by:   D. Thomos Lemons DO   Signed by:   D. Thomos Lemons DO on 10/04/2007   Method used:   Electronically sent to ...       [facility]*       [address]       [address] Shipping/mailing       [place], [place]  [address]       Ph: [phone]       Fax: [phone]   RxID:   [id number]  ] Current Allergies (reviewed today): No known allergies

## 2010-07-15 NOTE — Assessment & Plan Note (Signed)
Summary: Rachel [name]   Vital Signs:  Patient profile:   45 year old female Height:      61 inches Weight:      153.25 pounds BMI:     29.06 O2 Sat:      98 % on Room air Temp:     99 degrees F oral Pulse rate:   89 / minute BP sitting:   110 / 68  (left arm) Cuff size:   regular  Vitals Entered By: Zella Ball Ewing CMA (AAMA) (January 09, 2010 2:12 PM)  O2 Flow:  Room air CC: followup/RE   CC:  followup/RE.  History of Present Illness: Here to Rachel;  has stopped taking her medications to see if diet , activity and wt control could control her DM and other problems alone.  Pt denies CP, sob, doe, wheezing, orthopnea, pnd, worsening LE edema, palps, dizziness or syncope  Pt denies new neuro symptoms such as headache, facial or extremity weakness   Pt denies polydipsia, polyuria, or low sugar symptoms such as shakiness improved with eating.  Overall good compliance with meds, trying to follow low chol, DM diet, wt stable, little excercise however Admits to marked depressive symptoms in the past few months, without suicidal ideation or panic.  Has c/o low sex drive and does not want meds that might affect it.    Problems Prior to Update: 1)  Libido, Decreased  (ICD-799.81) 2)  Depression, Mild  (ICD-311) 3)  Common Migraine  (ICD-346.10) 4)  Intermittent Vertigo  (ICD-780.4) 5)  Otitis Media, Acute, Left  (ICD-382.9) 6)  Preventive Health Care  (ICD-V70.0) 7)  Insomnia-sleep Disorder-unspec  (ICD-780.52) 8)  Allergic Rhinitis  (ICD-477.9) 9)  Uti  (ICD-599.0) 10)  Hyperlipidemia  (ICD-272.4) 11)  Gerd  (ICD-530.81) 12)  Diabetes Mellitus, Type II  (ICD-250.00) 13)  Asthma  (ICD-493.90) 14)  Routine General Medical Exam@health  Care Facl  (ICD-V70.0)  Medications Prior to Update: 1)  Nexium 40 Mg  Cpdr (Esomeprazole Magnesium) .... Take 1 Tablet By Mouth Once A Day 2)  Simvastatin 40 Mg Tabs (Simvastatin) .Marland Kitchen.. 1 By Mouth Once Daily 3)  Onetouch Ultra Test   Strp (Glucose Blood) .... For  Two Times A Day Testing 4)  Adult Aspirin Ec Low Strength 81 Mg  Tbec (Aspirin) .Marland Kitchen.. 1 By Mouth Once Daily 5)  Symbicort 160-4.5 Mcg/act Aero (Budesonide-Formoterol Fumarate) .... 2 Puffs Two Times A Day 6)  Metformin Hcl 500 Mg Xr24h-Tab (Metformin Hcl) .... 3 By Mouth Qam  Current Medications (verified): 1)  Nexium 40 Mg  Cpdr (Esomeprazole Magnesium) .... Take 1 Tablet By Mouth Once A Day 2)  Simvastatin 40 Mg Tabs (Simvastatin) .Marland Kitchen.. 1 By Mouth Once Daily 3)  Onetouch Ultra Test   Strp (Glucose Blood) .... For Two Times A Day Testing 4)  Adult Aspirin Ec Low Strength 81 Mg  Tbec (Aspirin) .Marland Kitchen.. 1 By Mouth Once Daily 5)  Symbicort 160-4.5 Mcg/act Aero (Budesonide-Formoterol Fumarate) .... 2 Puffs Two Times A Day 6)  Metformin Hcl 500 Mg Xr24h-Tab (Metformin Hcl) .... 3 By Mouth Qam 7)  Bupropion Hcl 150 Mg Xr24h-Tab (Bupropion Hcl) .Marland Kitchen.. 1 By Mouth Once Daily  Allergies (verified): No Known Drug Allergies  Past History:  Past Medical History: Last updated: 07/08/2008 History of Asthma Diabetes mellitus, type II GERD Hyperlipidemia menopausal at [age] yo  Past Surgical History: Last updated: 01/04/2008 c-section x 3 s/p left knee surgury  Social History: Last updated: 01/04/2008 Separated She has 3 daughters  26, 67,  33.  17 y/o lives with her Never Smoked Alcohol use-no Drug use-no work - Psychologist, sport and exercise at [employer]  Risk Factors: Smoking Status: never (08/02/2007)  Review of Systems       all otherwise negative per pt -    Physical Exam  General:  alert and well-developed.   Head:  normocephalic and atraumatic.   Eyes:  vision grossly intact, pupils equal, and pupils round.   Ears:  R ear normal and L ear normal.   Nose:  no external deformity and no nasal discharge.   Mouth:  no gingival abnormalities and pharynx pink and moist.   Neck:  supple and no masses.   Lungs:  normal respiratory effort and normal breath sounds.   Heart:  normal rate and regular rhythm.     Extremities:  no edema, no erythema    Impression & Recommendations:  Problem # 1:  DIABETES MELLITUS, TYPE II (ICD-250.00)  Her updated medication list for this problem includes:    Adult Aspirin Ec Low Strength 81 Mg Tbec (Aspirin) .Marland Kitchen... 1 by mouth once daily    Metformin Hcl 500 Mg Xr24h-tab (Metformin hcl) .Marland KitchenMarland KitchenMarland KitchenMarland Kitchen 3 by mouth qam uncontrolled - to re-start meds, declines diet referral  Labs Reviewed: Creat: 0.6 (01/02/2010)    Reviewed HgBA1c results: 9.1 (01/02/2010)  7.7 (07/07/2009)  Problem # 2:  HYPERLIPIDEMIA (ICD-272.4)  Her updated medication list for this problem includes:    Simvastatin 40 Mg Tabs (Simvastatin) .Marland Kitchen... 1 by mouth once daily  Labs Reviewed: SGOT: 53 (07/08/2009)   SGPT: 28 (07/08/2009)   HDL:57.10 (01/02/2010), 44.90 (07/08/2009)  LDL:97 (07/08/2009), 118 (04/54/0981)  Chol:248 (01/02/2010), 168 (07/08/2009)  Trig:60.0 (01/02/2010), 129.0 (07/08/2009) fiar control - Continue all previous medications as before this visit   Problem # 3:  DEPRESSION, MILD (ICD-311)  Her updated medication list for this problem includes:    Bupropion Hcl 150 Mg Xr24h-tab (Bupropion hcl) .Marland Kitchen... 1 by mouth once daily to start med as above, Rachel next visit or sooner if needed, declines referral to counseling or psychiatry  Problem # 4:  LIBIDO, DECREASED (ICD-799.81) d/w pt - no specific treatment available but hopefully no worse with med above  Complete Medication List: 1)  Nexium 40 Mg Cpdr (Esomeprazole magnesium) .... Take 1 tablet by mouth once a day 2)  Simvastatin 40 Mg Tabs (Simvastatin) .Marland Kitchen.. 1 by mouth once daily 3)  Onetouch Ultra Test Strp (Glucose blood) .... For two times a day testing 4)  Adult Aspirin Ec Low Strength 81 Mg Tbec (Aspirin) .Marland Kitchen.. 1 by mouth once daily 5)  Symbicort 160-4.5 Mcg/act Aero (Budesonide-formoterol fumarate) .... 2 puffs two times a day 6)  Metformin Hcl 500 Mg Xr24h-tab (Metformin hcl) .... 3 by mouth qam 7)  Bupropion Hcl 150 Mg  Xr24h-tab (Bupropion hcl) .Marland Kitchen.. 1 by mouth once daily  Patient Instructions: 1)  Please take all new medications as prescribed 2)  Continue all previous medications as before this visit  3)  Please schedule a follow-up appointment in 6 months with CPX labs and: 4)  HbgA1C prior to visit, ICD-9: 250.02 5)  Urine Microalbumin prior to visit, ICD-9: Prescriptions: BUPROPION HCL 150 MG XR24H-TAB (BUPROPION HCL) 1 by mouth once daily  #90 x 3   Entered and Authorized by:   Corwin Levins MD   Signed by:   Corwin Levins MD on 01/09/2010   Method used:   Print then Give to Patient   RxID:   [id number] METFORMIN HCL 500 MG  XR24H-TAB (METFORMIN HCL) 3 by mouth qam  #270 x 3   Entered and Authorized by:   Corwin Levins MD   Signed by:   Corwin Levins MD on 01/09/2010   Method used:   Print then Give to Patient   RxID:   [id number] SYMBICORT 160-4.5 MCG/ACT AERO (BUDESONIDE-FORMOTEROL FUMARATE) 2 puffs two times a day  #3 x 3   Entered and Authorized by:   Corwin Levins MD   Signed by:   Corwin Levins MD on 01/09/2010   Method used:   Print then Give to Patient   RxID:   [id number] SIMVASTATIN 40 MG TABS (SIMVASTATIN) 1 by mouth once daily  #90 x 3   Entered and Authorized by:   Corwin Levins MD   Signed by:   Corwin Levins MD on 01/09/2010   Method used:   Print then Give to Patient   RxID:   [id number] NEXIUM 40 MG  CPDR (ESOMEPRAZOLE MAGNESIUM) Take 1 tablet by mouth once a day  #90 x 3   Entered and Authorized by:   Corwin Levins MD   Signed by:   Corwin Levins MD on 01/09/2010   Method used:   Print then Give to Patient   RxID:   [id number]

## 2010-07-15 NOTE — Letter (Signed)
Summary: EYE Exam/[facility]  EYE Exam/[facility]   Imported By: Lester Pocola 07/18/2008 09:58:53  _____________________________________________________________________  External Attachment:    Type:   Image     Comment:   External Document

## 2010-07-15 NOTE — Assessment & Plan Note (Signed)
Summary: NEW PT CPX/[place] [employer]/AWARE OF FEE/NML   Vital Signs:  Patient Profile:   45 Years Old Female Height:     61 inches Weight:      144 pounds BMI:     27.31 Temp:     99.2 degrees F oral Pulse rate:   88 / minute BP sitting:   118 / 79  (right arm)  Vitals Entered By: Glendell Docker (August 02, 2007 10:02 AM)                 Chief Complaint:  NEW PATIENT.  History of Present Illness: 45 year old hispanic femaile here to establish primary care.  She reports history of gestational diabetes.  She progressed to overt diabetes.  She recently moved from [place].  She ran out of all of her meds.  She does not check her blood sugar on a regular basis.  She reports she has been able to control her diabetes with diet and exercise.  She also reports history of asthma.  She reports infrequent flares.  She has been evaluated by allergist in the past.  She has multiple environmental sensitivities.  She does not take any maintenance medications.  She has hyperlipidemia.  She was started on lipitor but discontinued due to myalgias.  She was previously on Zetia.    We reviewed her recent blood work.    Current Allergies (reviewed today): No known allergies   Past Medical History:    History of Asthma    Diabetes mellitus, type II    GERD    Hyperlipidemia   Social History:    Separated    She has 3 daughters  43, 74, 23.  87 y/o lives with her    Never Smoked    Alcohol use-no    Drug use-no   Risk Factors:  Tobacco use:  never Drug use:  no Alcohol use:  no  Mammogram History:     Date of Last Mammogram:  07/01/2007    Results:  normal   PAP Smear History:     Date of Last PAP Smear:  05/31/2007    Results:  normal     Physical Exam  General:     alert, well-developed, well-nourished, and well-hydrated.   Head:     normocephalic and atraumatic.   Eyes:     vision grossly intact, pupils equal, pupils round, and pupils reactive to light.   Ears:  R ear normal and L ear normal.   Mouth:     Oral mucosa and oropharynx without lesions or exudates.  Teeth in good repair. Neck:     supple, no thyromegaly, and no carotid bruits.   Lungs:     normal respiratory effort and normal breath sounds.   Heart:     normal rate, regular rhythm, no murmur, and no gallop.   Abdomen:     soft, non-tender, no hepatomegaly, and no splenomegaly.   Pulses:     dorsalis pedis and posterior tibial pulses are full and equal bilaterally Extremities:     No lower extremity edema' Neurologic:     Normal sensation to monofilament, vibration and temperature. Skin:     Intact without suspicious lesions or rashes Psych:     normally interactive, good eye contact, not anxious appearing, and not depressed appearing.      Impression & Recommendations:  Problem # 1:  DIABETES MELLITUS, TYPE II (ICD-250.00) Patient hasn't taken glucophage for several months.  Restart metformin.  Goal  dose at least 1500mg .  Obtain A1c and microalb.  She reports recent normal eye exam by Dr. Nile Riggs.  Her updated medication list for this problem includes:    Glucophage Xr 500 Mg Tb24 (Metformin hcl) ..... One by mouth bid  Labs Reviewed: Creat: 0.6 (07/27/2007)      Problem # 2:  HYPERLIPIDEMIA (ICD-272.4) She had myalgias with lipitor in the past.  Trial of simvastatin.  Arrange f/u labs including CPK.    Her updated medication list for this problem includes:    Simvastatin 20 Mg Tabs (Simvastatin) ..... One by mouth q pm   Problem # 3:  GERD (ICD-530.81) She is having frequent GERD symptoms.  She ran out of Nexium.  Restart PPI.  Her updated medication list for this problem includes:    Nexium 40 Mg Cpdr (Esomeprazole magnesium) .Marland Kitchen... Take 1 tablet by mouth once a day   Problem # 4:  UTI (ICD-599.0) Recent U/A  positive for LE.  She reports dysuria and urinary frequency.    Her updated medication list for this problem includes:    Cipro 250 Mg Tabs  (Ciprofloxacin hcl) ..... One by mouth bid   Complete Medication List: 1)  Nexium 40 Mg Cpdr (Esomeprazole magnesium) .... Take 1 tablet by mouth once a day 2)  Glucophage Xr 500 Mg Tb24 (Metformin hcl) .... One by mouth bid 3)  Simvastatin 20 Mg Tabs (Simvastatin) .... One by mouth q pm 4)  Cipro 250 Mg Tabs (Ciprofloxacin hcl) .... One by mouth bid   Patient Instructions: 1)  Please schedule a follow-up appointment in 2 months. 2)  Lipid Panel prior to visit, ICD-9:  272.4 3)  AST, ALT, CPK:  272.4 4)  HbgA1C prior to visit, ICD-9:  250.00 5)  Urine Microalbumin prior to visit, ICD-9: 250.00 6)  Please return for lab work one (1) week before your next appointment.  (Fasting)    Prescriptions: CIPRO 250 MG  TABS (CIPROFLOXACIN HCL) one by mouth bid  #10 x 0   Entered and Authorized by:   D. Thomos Lemons DO   Signed by:   D. Thomos Lemons DO on 08/02/2007   Method used:   Print then Give to Patient   RxID:   [id number] SIMVASTATIN 20 MG  TABS (SIMVASTATIN) one by mouth q pm  #30 x 5   Entered and Authorized by:   D. Thomos Lemons DO   Signed by:   D. Thomos Lemons DO on 08/02/2007   Method used:   Print then Give to Patient   RxID:   [id number] GLUCOPHAGE XR 500 MG  TB24 (METFORMIN HCL) one by mouth bid  #60 x 5   Entered and Authorized by:   D. Thomos Lemons DO   Signed by:   D. Thomos Lemons DO on 08/02/2007   Method used:   Print then Give to Patient   RxID:   [id number] NEXIUM 40 MG  CPDR (ESOMEPRAZOLE MAGNESIUM) Take 1 tablet by mouth once a day  #30 x 5   Entered and Authorized by:   D. Thomos Lemons DO   Signed by:   D. Thomos Lemons DO on 08/02/2007   Method used:   Print then Give to Patient   RxID:   [id number] CIPRO 250 MG  TABS (CIPROFLOXACIN HCL) one by mouth bid  #10 x 0   Entered and Authorized by:   D. Thomos Lemons DO   Signed by:   D. Thomos Lemons DO on 08/02/2007  Method used:   Print then Give to Patient   RxID:   [id number] SIMVASTATIN 20 MG  TABS  (SIMVASTATIN) one by mouth q pm  #30 x 5   Entered and Authorized by:   D. Thomos Lemons DO   Signed by:   D. Thomos Lemons DO on 08/02/2007   Method used:   Print then Give to Patient   RxID:   [id number] NEXIUM 40 MG  CPDR (ESOMEPRAZOLE MAGNESIUM) Take 1 tablet by mouth once a day  #30 x 5   Entered and Authorized by:   D. Thomos Lemons DO   Signed by:   D. Thomos Lemons DO on 08/02/2007   Method used:   Print then Give to Patient   RxID:   [id number] GLUCOPHAGE XR 500 MG  TB24 (METFORMIN HCL) one by mouth bid  #60 x 5   Entered and Authorized by:   D. Thomos Lemons DO   Signed by:   D. Thomos Lemons DO on 08/02/2007   Method used:   Print then Give to Patient   RxID:   [id number]  ] Current Allergies (reviewed today): No known allergies    Preventive Care Screening  Mammogram:    Date:  07/01/2007    Results:  normal   Pap Smear:    Date:  05/31/2007    Results:  normal   Last Tetanus Booster:    Date:  10/03/2006    Results:  given

## 2010-07-15 NOTE — Progress Notes (Signed)
Summary: Chest pressure/dissiness  Phone Note Call from Patient Call back at Home Phone [phone]   Summary of Call: Patient left message on triage A that she has been dizzy, and c/o chest pressure. I called patient for more information and patient is getting worse. Per Ferdie Ping, patient was instructed to go to the ER.  Initial call taken by: Lucious Groves,  July 23, 2009 1:37 PM  Follow-up for Phone Call        noted Follow-up by: Corwin Levins MD,  July 23, 2009 1:55 PM

## 2010-07-15 NOTE — Letter (Signed)
Summary: Rachel [name]  [facility]   Imported By: Lester Shelby 08/04/2009 10:07:34  _____________________________________________________________________  External Attachment:    Type:   Image     Comment:   External Document

## 2010-07-15 NOTE — Assessment & Plan Note (Signed)
Summary: 6 mos rov/$50/cd   Vital Signs:  Patient profile:   45 year old female Height:      62 inches Weight:      152 pounds BMI:     27.90 O2 Sat:      97 % on Room air Temp:     97.9 degrees F oral Pulse rate:   81 / minute BP sitting:   100 / 70  (left arm) Cuff size:   regular  Vitals Entered ByZella Ball Ewing (July 08, 2009 10:03 AM)  O2 Flow:  Room air  CC: 6 Mo ROV/RE   CC:  6 Mo ROV/RE.  History of Present Illness: overall doing well,  Pt denies CP, sob, doe, wheezing, orthopnea, pnd, worsening LE edema, palps, dizziness or syncope   Pt denies new neuro symptoms such as headache, facial or extremity weakness   Pt denies polydipsia, polyuria, or low sugar symptoms such as shakiness improved with eating.  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 Take 1 Tablet By Mouth Once A Day 2)  Simvastatin 40 Mg Tabs (Simvastatin) .Marland Kitchen.. 1 By Mouth Once Daily 3)  Onetouch Ultra Test   Strp (Glucose Blood) .... For Two Times A Day Testing 4)  Adult Aspirin Ec Low Strength 81 Mg  Tbec (Aspirin) .Marland Kitchen.. 1 By Mouth Once Daily 5)  Symbicort 160-4.5 Mcg/act Aero (Budesonide-Formoterol Fumarate) .... 2 Puffs Two Times A Day 6)  Cephalexin 500 Mg Caps (Cephalexin) .Marland Kitchen.. 1po Three Times A Day  Current Medications (verified): 1)  Nexium 40 Mg  Cpdr (Esomeprazole  Magnesium) .... Take 1 Tablet By Mouth Once A Day 2)  Simvastatin 40 Mg Tabs (Simvastatin) .Marland Kitchen.. 1 By Mouth Once Daily 3)  Onetouch Ultra Test   Strp (Glucose Blood) .... For Two Times A Day Testing 4)  Adult Aspirin Ec Low Strength 81 Mg  Tbec (Aspirin) .Marland Kitchen.. 1 By Mouth Once Daily 5)  Symbicort 160-4.5 Mcg/act Aero (Budesonide-Formoterol Fumarate) .... 2 Puffs Two Times A Day 6)  Metformin Hcl 500 Mg Tabs (Metformin Hcl) .... 2po Qam and 1 By Mouth Qpm  Allergies (verified): No Known Drug Allergies  Past History:  Past Medical History: Last updated: 07/08/2008 History of Asthma Diabetes mellitus, type II GERD Hyperlipidemia menopausal at [age] yo  Past Surgical History: Last updated: 01/04/2008 c-section x 3 s/p left knee surgury  Family History: Last updated: 01/04/2008 mother with HTN father with HTN, asthma, DM 12 siblings - brotherwith brain tumor, others with DM aunt with cervical cancer  Social History: Last updated: 01/04/2008 Separated She has 3 daughters  17, 9, 20.  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         all otherwise negative per pt - except for headaches several times per wk  Physical Exam  General:  alert and well-developed.   Head:  normocephalic and atraumatic.   Eyes:  vision grossly intact, pupils equal, and pupils round.   Ears:  R ear normal and L ear normal.     Nose:  no external deformity and no nasal discharge.   Mouth:  no gingival abnormalities and pharynx pink and moist.   Neck:  supple and no masses.   Lungs:  normal respiratory effort and normal breath sounds.   Heart:  normal rate and regular rhythm.   Abdomen:  soft, non-tender, and normal bowel sounds.   Msk:  no joint tenderness and no joint swelling.   Extremities:  no edema, no erythema  Neurologic:  cranial nerves II-XII intact and strength normal in all extremities.     Impression & Recommendations:  Problem # 1:  Preventive Health Care (ICD-V70.0)  Overall doing well, age appropriate education and counseling updated and referral for appropriate preventive services done unless declined, immunizations up to date or declined, diet counseling done if overweight, urged to quit smoking if smokes , most recent labs reviewed and current ordered if appropriate, ecg reviewed or declined (interpretation per ECG scanned in the EMR if done); information regarding Medicare Prevention requirements given if appropriate  Orders: TLB-BMP (Basic Metabolic Panel-BMET) (80048-METABOL) TLB-CBC Platelet - w/Differential (85025-CBCD) TLB-Hepatic/Liver Function Pnl (80076-HEPATIC) TLB-Lipid Panel (80061-LIPID) TLB-TSH (Thyroid Stimulating Hormone) (84443-TSH) TLB-Udip ONLY (81003-UDIP)  Problem # 2:  DIABETES MELLITUS, TYPE II (ICD-250.00)  Her updated medication list for this problem includes:    Adult Aspirin Ec Low Strength 81 Mg Tbec (Aspirin) .Marland Kitchen... 1 by mouth once daily    Metformin Hcl 500 Mg Tabs (Metformin hcl) .Marland Kitchen... 2po qam and 1 by mouth qpm metformin increased with goal a1c less than 6.5, Pt to cont DM diet, excercise, wt loss efforts;   Labs Reviewed: Creat: 0.6 (01/02/2009)    Reviewed HgBA1c results: 7.7 (07/07/2009)  7.5 (01/02/2009)  Complete Medication List: 1)  Nexium 40 Mg Cpdr (Esomeprazole magnesium) .... Take 1 tablet by mouth once a day 2)  Simvastatin 40 Mg Tabs  (Simvastatin) .Marland Kitchen.. 1 by mouth once daily 3)  Onetouch Ultra Test Strp (Glucose blood) .... For two times a day testing 4)  Adult Aspirin Ec Low Strength 81 Mg Tbec (Aspirin) .Marland Kitchen.. 1 by mouth once daily 5)  Symbicort 160-4.5 Mcg/act Aero (Budesonide-formoterol fumarate) .... 2 puffs two times a day 6)  Metformin Hcl 500 Mg Tabs (Metformin hcl) .... 2po qam and 1 by mouth qpm  Patient Instructions: 1)  please call if you need to be referred to [facility] 2)  Continue all previous medications as before this visit , except increase the metformin to 2 in the AM, 1 in the PM 3)  Please go to the Lab in the basement for your blood and/or urine tests today 4)  Please schedule a follow-up appointment in 6 months with: 5)  BMP prior to visit, ICD-9: 250.02 6)  Lipid Panel prior to visit, ICD-9: 7)  HbgA1C prior to visit, ICD-9: Prescriptions: METFORMIN HCL 500 MG TABS (METFORMIN HCL) 2po qam and 1 by mouth qpm  #270 x  3   Entered and Authorized by:   Corwin Levins MD   Signed by:   Corwin Levins MD on 07/08/2009   Method used:   Electronically to        [facility]* (retail)       [address]       [address] Shipping/mailing       [place], [place]  [address]       Ph: [phone]       Fax: [phone]   RxID:   [id number]

## 2010-07-17 ENCOUNTER — Ambulatory Visit: Admit: 2010-07-17 | Payer: Self-pay | Admitting: Internal Medicine

## 2010-07-17 ENCOUNTER — Ambulatory Visit (INDEPENDENT_AMBULATORY_CARE_PROVIDER_SITE_OTHER): Payer: 59 | Admitting: Internal Medicine

## 2010-07-17 ENCOUNTER — Encounter: Payer: Self-pay | Admitting: Internal Medicine

## 2010-07-17 ENCOUNTER — Telehealth: Payer: Self-pay | Admitting: Internal Medicine

## 2010-07-17 DIAGNOSIS — Z0000 Encounter for general adult medical examination without abnormal findings: Secondary | ICD-10-CM

## 2010-07-22 NOTE — Progress Notes (Signed)
Summary: Clarification  Phone Note From Pharmacy   Caller: [facility]* Summary of Call: Pharmacy requesting clarification on prescription Januvia was written 1 by mouth two times a day , normally is written once daily? They are requesting clarification. Initial call taken by: Robin Ewing CMA Duncan Dull),  July 17, 2010 12:04 PM  Follow-up for Phone Call        sorry - once per day is correct Follow-up by: Corwin Levins MD,  July 17, 2010 12:06 PM  Additional Follow-up for Phone Call Additional follow up Details #1::        Notified pharm spoke with Romeo Apple gave md response Additional Follow-up by: Orlan Leavens RMA,  July 17, 2010 2:32 PM    New/Updated Medications: JANUVIA 100 MG TABS (SITAGLIPTIN PHOSPHATE) take 1 by mouth once daily

## 2010-07-22 NOTE — Assessment & Plan Note (Signed)
Summary: FU#? CD   Vital Signs:  Patient profile:   45 year old female Height:      61 inches Weight:      155.50 pounds BMI:     29.49 O2 Sat:      98 % on Room air Temp:     98.5 degrees F oral Pulse rate:   94 / minute BP sitting:   100 / 66  (left arm) Cuff size:   regular  Vitals Entered By: Zella Ball Ewing CMA (AAMA) (July 17, 2010 11:01 AM)  O2 Flow:  Room air  Preventive Care Screening  Last Flu Shot:    Date:  03/14/2010    Results:  given   CC: 6 month followup/RE   CC:  6 month followup/RE.  History of Present Illness: here for wellness and f/u; overall doing ok;  Pt denies CP, worsening sob, doe, wheezing, orthopnea, pnd, worsening LE edema, palps, dizziness or syncope  Pt denies new neuro symptoms such as headache, facial or extremity weakness  Pt denies polydipsia, polyuria, or low sugar symptoms such as shakiness improved with eating.  Overall good compliance with meds, trying to follow low chol, DM diet, wt stable, little excercise however  CBG's have been somwhat elevated about 200 at times .   Unfortunately gets diarrhea with 3 metformin, so often takes only 2.  Denies worsening depressive symptoms, suicidal ideation, or panic.   Overall good compliance with meds, and good tolerability.  No fever, wt loss, night sweats, loss of appetite or other constitutional symptoms  Pt states good ability with ADL's, low fall risk, home safety reviewed and adequate, no significant change in hearing or vision, trying to follow lower chol diet, and has been excerciseing daily 30 min at the [employer] related gym, and lost 7 lbs total so far.  Also has seen dietician through the [facility] program.  Needs med refills today  Preventive Screening-Counseling & Management      Drug Use:  no.    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 Take 1 Tablet By Mouth Once A Day 2)  Simvastatin 40 Mg Tabs (Simvastatin) .Marland Kitchen.. 1 By Mouth Once Daily 3)  Onetouch Ultra Test   Strp (Glucose Blood) .... For Two Times A Day Testing 4)  Adult Aspirin Ec Low Strength 81 Mg  Tbec (Aspirin) .Marland Kitchen.. 1 By Mouth Once Daily 5)  Symbicort 160-4.5 Mcg/act Aero (Budesonide-Formoterol Fumarate) .... 2 Puffs Two Times A Day 6)  Metformin Hcl 500 Mg Xr24h-Tab (Metformin Hcl) .... 3 By Mouth Qam 7)  Bupropion Hcl 150 Mg Xr24h-Tab (Bupropion Hcl) .Marland Kitchen.. 1 By Mouth Once Daily 8)  Trueresult Blood Glucose W/device Kit (Blood Glucose Monitoring Suppl) .... Use As Directed 9)  Truetest Test  Strp (Glucose Blood) .... Use As Directed  Current Medications (verified): 1)  Nexium 40 Mg  Cpdr (Esomeprazole Magnesium) .... Take 1 Tablet By  Mouth Once A Day 2)  Simvastatin 40 Mg Tabs (Simvastatin) .Marland Kitchen.. 1 By Mouth Once Daily 3)  Onetouch Ultra Test   Strp (Glucose Blood) .... For Two Times A Day Testing 4)  Adult Aspirin Ec Low Strength 81 Mg  Tbec (Aspirin) .Marland Kitchen.. 1 By Mouth Once Daily 5)  Symbicort 160-4.5 Mcg/act Aero (Budesonide-Formoterol Fumarate) .... 2 Puffs Two Times A Day 6)  Trueresult Blood Glucose W/device Kit (Blood Glucose Monitoring Suppl) .... Use As Directed 7)  Truetest Test  Strp (Glucose Blood) .... Use As Directed 8)  Glyburide-Metformin 2.5-500 Mg Tabs (Glyburide-Metformin) .Marland Kitchen.. 1po Two Times A Day 9)  Januvia 100 Mg Tabs (Sitagliptin Phosphate) .Marland Kitchen.. 1 By Mouth Two Times A Day  Allergies (verified): 1)   Lipitor  Past History:  Past Medical History: Last updated: 07/08/2008 History of Asthma Diabetes mellitus, type II GERD Hyperlipidemia menopausal at [age] yo  Past Surgical History: Last updated: 01/04/2008 c-section x 3 s/p left knee surgury  Family History: Last updated: 01/04/2008 mother with HTN father with HTN, asthma, DM 12 siblings - brotherwith brain tumor, others with DM aunt with cervical cancer  Social History: Last updated: 07/17/2010 Separated She has 3 daughters  54, 65, 97.  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         all otherwise negative per pt -    Physical Exam  General:  alert and overweight-appearing - mild for Ht Head:  normocephalic and atraumatic.   Eyes:  vision grossly intact, pupils equal, and pupils round.   Ears:  R ear normal and L ear normal.   Nose:  no external deformity and no nasal discharge.   Mouth:  no gingival abnormalities and pharynx pink and moist.   Neck:  supple and no masses.   Lungs:  normal respiratory effort and normal breath sounds.   Heart:  normal rate and regular rhythm.   Abdomen:  soft, non-tender, and normal bowel sounds.   Msk:  no joint tenderness and no joint swelling.   Extremities:  no edema, no  erythema  Neurologic:  cranial nerves II-XII intact and strength normal in all extremities.   Skin:  color normal and no rashes.   Psych:  not depressed appearing and slightly anxious.     Impression & Recommendations:  Problem # 1:  Preventive Health Care (ICD-V70.0) Overall doing well, age appropriate education and counseling updated, referral for preventive services and immunizations addressed, dietary counseling and smoking status adressed , most recent labs reviewed I have personally reviewed and have noted 1.The patient's medical and social history 2.Their use of alcohol, tobacco or illicit drugs 3.Their current medications and supplements 4. Functional ability including ADL's, fall risk, home safety risk, hearing & visual impairment  5.Diet and physical activities 6.Evidence for depression or mood disorders The patients weight, height, BMI  have been recorded in the chart I have made referrals, counseling and  provided education to the patient based review of the above   Problem # 2:  DIABETES MELLITUS, TYPE II (ICD-250.00)  The following medications were removed from the medication list:    Metformin Hcl 500 Mg Xr24h-tab (Metformin hcl) .Marland KitchenMarland KitchenMarland KitchenMarland Kitchen 3 by mouth qam Her updated medication list for this problem includes:    Adult Aspirin Ec Low Strength 81 Mg Tbec (Aspirin) .Marland Kitchen... 1 by mouth once daily    Glyburide-metformin 2.5-500 Mg Tabs (Glyburide-metformin) .Marland Kitchen... 1po two times a day    Januvia 100 Mg Tabs (Sitagliptin phosphate) .Marland Kitchen... 1 by mouth two times a day meds adjusted for better cointrol and less chance of diarrhea - to f/u 3 mo with labs  Labs Reviewed: Creat: 0.5 (07/13/2010)    Reviewed HgBA1c results: 9.1 (07/13/2010)  9.1 (01/02/2010)  Problem # 3:  HYPERLIPIDEMIA (ICD-272.4)  Her updated medication list for this problem includes:    Simvastatin 40 Mg Tabs (Simvastatin) .Marland Kitchen... 1 by mouth once daily  Labs Reviewed: SGOT: 18 (07/13/2010)   SGPT: 17 (07/13/2010)    HDL:46.40 (07/13/2010), 57.10 (01/02/2010)  LDL:97 (07/08/2009), 118 (16/03/9603)  Chol:205 (07/13/2010), 248 (01/02/2010)  Trig:156.0 (07/13/2010), 60.0 (01/02/2010) uncontrolled, but with other med changes, pt declines change to crestor at this time  Complete Medication List: 1)  Nexium 40 Mg Cpdr (Esomeprazole magnesium) .... Take 1 tablet by mouth once a day 2)  Simvastatin 40 Mg Tabs (Simvastatin) .Marland Kitchen.. 1 by mouth once daily 3)  Onetouch Ultra Test Strp (Glucose blood) .... For two times a day testing 4)  Adult Aspirin Ec Low Strength 81 Mg Tbec (Aspirin) .Marland Kitchen.. 1 by mouth once daily 5)  Symbicort 160-4.5 Mcg/act Aero (Budesonide-formoterol fumarate) .... 2 puffs two times a day 6)  Trueresult Blood Glucose W/device Kit (Blood glucose monitoring suppl) .... Use as directed 7)  Truetest Test Strp (Glucose blood) .... Use as directed 8)  Glyburide-metformin 2.5-500 Mg Tabs (Glyburide-metformin) .Marland Kitchen.. 1po two times a day 9)  Januvia 100 Mg Tabs (Sitagliptin phosphate) .Marland Kitchen.. 1 by mouth two times a day  Patient Instructions: 1)  stop the metformin 2)  start the januvia 100 mg per day 3)  start the glyburide-metformin two times a day 4)  Continue all previous medications as before this visit  5)  Please schedule a follow-up appointment in 3 months. with: 6)  BMP prior to visit, ICD-9: 250.02 7)  Lipid Panel prior to visit, ICD-9: 8)  HbgA1C prior to visit, ICD-9: Prescriptions: SYMBICORT 160-4.5 MCG/ACT AERO (BUDESONIDE-FORMOTEROL FUMARATE) 2 puffs two times a day  #3 x 3   Entered and Authorized by:   Corwin Levins MD   Signed by:   Corwin Levins MD on 07/17/2010   Method used:   Electronically to        [facility]* (retail)       [address]       [address] Shipping/mailing       [place], [place]  [address]       Ph: [phone]       Fax: [phone]   RxID:   [id number] SIMVASTATIN 40 MG TABS (SIMVASTATIN) 1 by mouth once daily  #90 x 3   Entered and  Authorized by:   Corwin Levins MD   Signed by:   Corwin Levins MD on 07/17/2010   Method used:   Electronically to        [facility]* (retail)  [address]       [address] Shipping/mailing       [place], [place]  [address]       Ph: [phone]       Fax: [phone]   RxID:   [id number] NEXIUM 40 MG  CPDR (ESOMEPRAZOLE MAGNESIUM) Take 1 tablet by mouth once a day  #90 x 3   Entered and Authorized by:   Corwin Levins MD   Signed by:   Corwin Levins MD on 07/17/2010   Method used:   Electronically to        [facility]* (retail)       [address]       [address] Shipping/mailing       [place], [place]  [address]       Ph: [phone]       Fax: [phone]   RxID:   [id number] JANUVIA 100 MG TABS (SITAGLIPTIN PHOSPHATE) 1 by mouth two times a day  #90 x 3   Entered and Authorized by:   Corwin Levins MD   Signed by:   Corwin Levins MD on 07/17/2010   Method used:   Electronically to        [facility]* (retail)       [address]       [address] Shipping/mailing       [place], [place]  [address]       Ph: [phone]       Fax: [phone]   RxID:   [id number] GLYBURIDE-METFORMIN 2.5-500 MG TABS (GLYBURIDE-METFORMIN) 1po two times a day  #180 x 3   Entered and Authorized by:   Corwin Levins MD   Signed by:   Corwin Levins MD on 07/17/2010   Method used:   Electronically to        [facility]* (retail)       [address]       [address] Shipping/mailing       [place], [place]  [address]       Ph: [phone]       Fax: [phone]   RxID:   [id number]    Orders Added: 1)  Est. Patient 40-64 years [phone]

## 2010-07-31 ENCOUNTER — Encounter: Payer: Self-pay | Admitting: Internal Medicine

## 2010-08-11 NOTE — Letter (Signed)
Summary: Rachel [name]  [facility]   Imported By: Sherian Rein 08/04/2010 10:17:10  _____________________________________________________________________  External Attachment:    Type:   Image     Comment:   External Document

## 2010-09-02 LAB — COMPREHENSIVE METABOLIC PANEL
ALT: 16 U/L (ref 0–35)
AST: 17 U/L (ref 0–37)
Albumin: 3.8 g/dL (ref 3.5–5.2)
Alkaline Phosphatase: 67 U/L (ref 39–117)
BUN: 10 mg/dL (ref 6–23)
CO2: 26 mEq/L (ref 19–32)
Calcium: 8.8 mg/dL (ref 8.4–10.5)
Chloride: 103 mEq/L (ref 96–112)
Creatinine, Ser: 0.68 mg/dL (ref 0.4–1.2)
GFR calc Af Amer: >60 mL/min (ref >60)
GFR calc non Af Amer: >60 mL/min (ref >60)
Glucose, Bld: 144 mg/dL — ABNORMAL HIGH (ref 70–99)
Potassium: 4.1 mEq/L (ref 3.5–5.1)
Sodium: 136 mEq/L (ref 135–145)
Total Bilirubin: 0.5 mg/dL (ref 0.3–1.2)
Total Protein: 6.8 g/dL (ref 6.0–8.3)

## 2010-09-02 LAB — DIFFERENTIAL
Basophils Absolute: 0.1 10*3/uL (ref 0.0–0.1)
Basophils Relative: 1 % (ref 0–1)
Eosinophils Absolute: 0.1 10*3/uL (ref 0.0–0.7)
Eosinophils Relative: 1 % (ref 0–5)
Lymphocytes Relative: 53 % — ABNORMAL HIGH (ref 12–46)
Lymphs Abs: 4.5 10*3/uL — ABNORMAL HIGH (ref 0.7–4.0)
Monocytes Absolute: 0.8 10*3/uL (ref 0.1–1.0)
Monocytes Relative: 9 % (ref 3–12)
Neutro Abs: 3.1 10*3/uL (ref 1.7–7.7)
Neutrophils Relative %: 37 % — ABNORMAL LOW (ref 43–77)

## 2010-09-02 LAB — CBC
HCT: 33.2 % — ABNORMAL LOW (ref 36.0–46.0)
HCT: 35 % — ABNORMAL LOW (ref 36.0–46.0)
Hemoglobin: 11 g/dL — ABNORMAL LOW (ref 12.0–15.0)
Hemoglobin: 11.9 g/dL — ABNORMAL LOW (ref 12.0–15.0)
MCHC: 33.1 g/dL (ref 30.0–36.0)
MCHC: 33.8 g/dL (ref 30.0–36.0)
MCV: 79.9 fL (ref 78.0–100.0)
MCV: 80.8 fL (ref 78.0–100.0)
Platelets: 345 10*3/uL (ref 150–400)
Platelets: 382 10*3/uL (ref 150–400)
RBC: 4.11 MIL/uL (ref 3.87–5.11)
RBC: 4.39 MIL/uL (ref 3.87–5.11)
RDW: 13.8 % (ref 11.5–15.5)
RDW: 14.1 % (ref 11.5–15.5)
WBC: 10.4 10*3/uL (ref 4.0–10.5)
WBC: 8.6 10*3/uL (ref 4.0–10.5)

## 2010-09-02 LAB — APTT: aPTT: 66 seconds — ABNORMAL HIGH (ref 24–37)

## 2010-09-02 LAB — CK TOTAL AND CKMB (NOT AT ARMC)
CK, MB: 0.7 ng/mL (ref 0.3–4.0)
Relative Index: INVALID (ref 0.0–2.5)
Total CK: 53 U/L (ref 7–177)

## 2010-09-02 LAB — LIPID PANEL
Cholesterol: 210 mg/dL — ABNORMAL HIGH (ref 0–200)
HDL: 40 mg/dL (ref >39)
LDL Cholesterol: 128 mg/dL — ABNORMAL HIGH (ref 0–99)
Total CHOL/HDL Ratio: 5.3 RATIO
Triglycerides: 210 mg/dL — ABNORMAL HIGH (ref <150)
VLDL: 42 mg/dL — ABNORMAL HIGH (ref 0–40)

## 2010-09-02 LAB — BASIC METABOLIC PANEL
BUN: 9 mg/dL (ref 6–23)
CO2: 28 mEq/L (ref 19–32)
Calcium: 9.6 mg/dL (ref 8.4–10.5)
Chloride: 99 mEq/L (ref 96–112)
Creatinine, Ser: 0.72 mg/dL (ref 0.4–1.2)
GFR calc Af Amer: >60 mL/min (ref >60)
GFR calc non Af Amer: >60 mL/min (ref >60)
Glucose, Bld: 111 mg/dL — ABNORMAL HIGH (ref 70–99)
Potassium: 3.8 mEq/L (ref 3.5–5.1)
Sodium: 138 mEq/L (ref 135–145)

## 2010-09-02 LAB — HEMOGLOBIN A1C
Hgb A1c MFr Bld: 8 % — ABNORMAL HIGH (ref 4.6–6.1)
Mean Plasma Glucose: 183 mg/dL

## 2010-09-02 LAB — CARDIAC PANEL(CRET KIN+CKTOT+MB+TROPI)
CK, MB: 0.5 ng/mL (ref 0.3–4.0)
CK, MB: 0.5 ng/mL (ref 0.3–4.0)
Relative Index: INVALID (ref 0.0–2.5)
Relative Index: INVALID (ref 0.0–2.5)
Total CK: 47 U/L (ref 7–177)
Total CK: 53 U/L (ref 7–177)
Troponin I: 0.01 ng/mL (ref 0.00–0.06)
Troponin I: <0.01 ng/mL (ref 0.00–0.06)

## 2010-09-02 LAB — GLUCOSE, CAPILLARY
Glucose-Capillary: 128 mg/dL — ABNORMAL HIGH (ref 70–99)
Glucose-Capillary: 137 mg/dL — ABNORMAL HIGH (ref 70–99)
Glucose-Capillary: 140 mg/dL — ABNORMAL HIGH (ref 70–99)
Glucose-Capillary: 146 mg/dL — ABNORMAL HIGH (ref 70–99)

## 2010-09-02 LAB — POCT CARDIAC MARKERS
CKMB, poc: <1 ng/mL — ABNORMAL LOW (ref 1.0–8.0)
Myoglobin, poc: 34.2 ng/mL (ref 12–200)
Troponin i, poc: <0.05 ng/mL (ref 0.00–0.09)

## 2010-09-02 LAB — TROPONIN I: Troponin I: <0.01 ng/mL (ref 0.00–0.06)

## 2010-09-02 LAB — PROTIME-INR
INR: 1.08 (ref 0.00–1.49)
Prothrombin Time: 13.9 seconds (ref 11.6–15.2)

## 2010-09-15 LAB — POCT I-STAT, CHEM 8
BUN: 11 mg/dL (ref 6–23)
Calcium, Ion: 1.15 mmol/L (ref 1.12–1.32)
Chloride: 100 mEq/L (ref 96–112)
Creatinine, Ser: 0.7 mg/dL (ref 0.4–1.2)
Glucose, Bld: 151 mg/dL — ABNORMAL HIGH (ref 70–99)
HCT: 37 % (ref 36.0–46.0)
Hemoglobin: 12.6 g/dL (ref 12.0–15.0)
Potassium: 3.6 mEq/L (ref 3.5–5.1)
Sodium: 138 mEq/L (ref 135–145)
TCO2: 27 mmol/L (ref 0–100)

## 2010-09-21 LAB — POCT URINALYSIS DIP (DEVICE)
Bilirubin Urine: NEGATIVE
Glucose, UA: NEGATIVE mg/dL
Hgb urine dipstick: NEGATIVE
Ketones, ur: NEGATIVE mg/dL
Nitrite: NEGATIVE
Protein, ur: NEGATIVE mg/dL
Specific Gravity, Urine: 1.015 (ref 1.005–1.030)
Urobilinogen, UA: 0.2 mg/dL (ref 0.0–1.0)
pH: 7.5 (ref 5.0–8.0)

## 2010-09-21 LAB — URINE CULTURE: Colony Count: >=100000

## 2010-09-28 LAB — GLUCOSE, CAPILLARY: Glucose-Capillary: 139 mg/dL — ABNORMAL HIGH (ref 70–99)

## 2010-10-09 ENCOUNTER — Other Ambulatory Visit: Payer: 59

## 2010-10-09 ENCOUNTER — Other Ambulatory Visit: Payer: Self-pay | Admitting: Internal Medicine

## 2010-10-09 DIAGNOSIS — IMO0001 Reserved for inherently not codable concepts without codable children: Secondary | ICD-10-CM

## 2010-10-11 ENCOUNTER — Encounter: Payer: Self-pay | Admitting: Internal Medicine

## 2010-10-11 DIAGNOSIS — Z0000 Encounter for general adult medical examination without abnormal findings: Secondary | ICD-10-CM | POA: Insufficient documentation

## 2010-10-13 ENCOUNTER — Other Ambulatory Visit (INDEPENDENT_AMBULATORY_CARE_PROVIDER_SITE_OTHER): Payer: 59

## 2010-10-13 DIAGNOSIS — IMO0001 Reserved for inherently not codable concepts without codable children: Secondary | ICD-10-CM

## 2010-10-13 DIAGNOSIS — E119 Type 2 diabetes mellitus without complications: Secondary | ICD-10-CM

## 2010-10-13 LAB — HEMOGLOBIN A1C: Hgb A1c MFr Bld: 10 % — ABNORMAL HIGH (ref 4.6–6.5)

## 2010-10-13 LAB — BASIC METABOLIC PANEL
BUN: 10 mg/dL (ref 6–23)
CO2: 29 mEq/L (ref 19–32)
Calcium: 9.4 mg/dL (ref 8.4–10.5)
Chloride: 102 mEq/L (ref 96–112)
Creatinine, Ser: 0.7 mg/dL (ref 0.4–1.2)
GFR: 97.18 mL/min (ref >60.00)
Glucose, Bld: 166 mg/dL — ABNORMAL HIGH (ref 70–99)
Potassium: 4.3 mEq/L (ref 3.5–5.1)
Sodium: 139 mEq/L (ref 135–145)

## 2010-10-13 LAB — LIPID PANEL
Cholesterol: 170 mg/dL (ref 0–200)
HDL: 45.1 mg/dL (ref >39.00)
LDL Cholesterol: 96 mg/dL (ref 0–99)
Total CHOL/HDL Ratio: 4
Triglycerides: 147 mg/dL (ref 0.0–149.0)
VLDL: 29.4 mg/dL (ref 0.0–40.0)

## 2010-10-16 ENCOUNTER — Ambulatory Visit (INDEPENDENT_AMBULATORY_CARE_PROVIDER_SITE_OTHER): Payer: 59 | Admitting: Internal Medicine

## 2010-10-16 ENCOUNTER — Encounter: Payer: Self-pay | Admitting: Internal Medicine

## 2010-10-16 VITALS — BP 102/78 | HR 88 | Temp 98.4°F | Ht 61.0 in | Wt 153.0 lb

## 2010-10-16 DIAGNOSIS — F329 Major depressive disorder, single episode, unspecified: Secondary | ICD-10-CM

## 2010-10-16 DIAGNOSIS — Z0000 Encounter for general adult medical examination without abnormal findings: Secondary | ICD-10-CM

## 2010-10-16 DIAGNOSIS — E785 Hyperlipidemia, unspecified: Secondary | ICD-10-CM

## 2010-10-16 DIAGNOSIS — E119 Type 2 diabetes mellitus without complications: Secondary | ICD-10-CM

## 2010-10-16 DIAGNOSIS — J069 Acute upper respiratory infection, unspecified: Secondary | ICD-10-CM

## 2010-10-16 MED ORDER — PIOGLITAZONE HCL 45 MG PO TABS: 45.0000 mg | ORAL_TABLET | Freq: Every day | ORAL | Status: DC

## 2010-10-16 MED ORDER — METFORMIN HCL 500 MG PO TABS: 1000.0000 mg | ORAL_TABLET | Freq: Two times a day (BID) | ORAL | Status: DC

## 2010-10-16 NOTE — Assessment & Plan Note (Signed)
Uncontrolled, with stead increase in the a1c in the past 2-3 yrs despite increased oral meds;  She does not take the glucovance sometimes when she feels jittery, though sugar is not low when she checks;  Will stop the glucovance;  Will need max metformin dosing, cont the januvia, add actos 45 mg;  If not controlled next visit will need insulin

## 2010-10-16 NOTE — Patient Instructions (Addendum)
Stop the glucovance (glyburide metformin) Start the metformin at the higher dosing (a total of 1000 mg twice per day) Start the new Actos 45 mg per day Continue all other medications as before, including the [place] and the other medications Please return in 3 mo with Lab testing done 3-5 days before

## 2010-10-16 NOTE — Assessment & Plan Note (Signed)
Mild, incidental, prob viral, seems to be improving; for tylenol prn

## 2010-10-17 ENCOUNTER — Encounter: Payer: Self-pay | Admitting: Internal Medicine

## 2010-10-17 NOTE — Progress Notes (Signed)
Subjective:    Patient ID: Rachel [name], female    DOB: [birth year], 45 y.o.   MRN: [id number]  HPI  Here to f/u; overall doing ok,  Pt denies chest pain, increased sob or doe, wheezing, orthopnea, PND, increased LE swelling, palpitations, dizziness or syncope.  Pt denies new neurological symptoms such as new headache, or facial or extremity weakness or numbness   Pt denies polydipsia, polyuria, or low sugar symptoms such as weakness or confusion improved with po intake.  Pt states overall good compliance with meds, trying to follow lower cholesterol, diabetic diet, wt overall stable but little exercise however.  Does have incidentally URI symptoms for the past 3 dayw with slight cough and ST, now better today. Overall good compliance with treatment, and good medicine tolerability.   Pt denies fever, wt loss, night sweats, loss of appetite, or other constitutional symptoms Denies worsening depressive symptoms, suicidal ideation, or panic. Past Medical History  Diagnosis Date  . DIABETES MELLITUS, TYPE II 08/02/2007  . HYPERLIPIDEMIA 08/02/2007  . DEPRESSION, MILD 01/09/2010  . COMMON MIGRAINE 05/15/2009  . ALLERGIC RHINITIS 10/04/2007  . ASTHMA 08/02/2007  . GERD 08/02/2007  . INTERMITTENT VERTIGO 05/15/2009  . INSOMNIA-SLEEP DISORDER-UNSPEC 01/04/2008  . LIBIDO, DECREASED 01/09/2010   Past Surgical History  Procedure Date  . Cesarean section   . S/p left knee surgury     reports that she has never smoked. She does not have any smokeless tobacco history on file. She reports that she does not drink alcohol or use illicit drugs. family history includes Asthma in her father; Cancer in her other; Diabetes in her father; and Hypertension in her father and mother. Allergies  Allergen Reactions  . Atorvastatin     REACTION: myalgias  . Nitroglycerin    Current Outpatient Prescriptions on File Prior to Visit  Medication Sig Dispense Refill  . aspirin 81 MG EC tablet Take 81 mg by mouth daily.        .  Blood Glucose Monitoring Suppl (TRUERESULT BLOOD GLUCOSE) W/DEVICE KIT Use as directed       . budesonide-formoterol (SYMBICORT) 160-4.5 MCG/ACT inhaler Inhale 2 puffs into the lungs 2 (two) times daily.        Marland Kitchen esomeprazole (NEXIUM) 40 MG capsule Take 40 mg by mouth daily.        Marland Kitchen glucose blood (ONE TOUCH ULTRA TEST) test strip 2 (two) times daily. Use as instructed       . simvastatin (ZOCOR) 40 MG tablet Take 40 mg by mouth daily.        . sitaGLIPtan (JANUVIA) 100 MG tablet Take 100 mg by mouth daily.        Marland Kitchen glucose blood (TRUETEST TEST) test strip Use as directed         Review of Systems All otherwise neg per pt     Objective:   Physical Exam BP 102/78  Pulse 88  Temp(Src) 98.4 F (36.9 C) (Oral)  Ht 5\' 1"  (1.549 m)  Wt 153 lb (69.4 kg)  BMI 28.91 kg/m2  SpO2 97% Physical Exam  VS noted Constitutional: Pt appears well-developed and well-nourished.  HENT: Head: Normocephalic.  Right Ear: External ear normal.  Left Ear: External ear normal.  Bilat tm's mild erythema.  Sinus nontender.  Pharynx mild erythema Eyes: Conjunctivae and EOM are normal. Pupils are equal, round, and reactive to light.  Neck: Normal range of motion. Neck supple.  Cardiovascular: Normal rate and regular rhythm.   Pulmonary/Chest: Effort normal  and breath sounds normal.  Abd:  Soft, NT, non-distended, + BS Neurological: Pt is alert. No cranial nerve deficit.  Skin: Skin is warm. No erythema.  Psychiatric: Pt behavior is normal. Thought content normal.  No depressed appearing, 1+ nervous        Assessment & Plan:

## 2010-10-17 NOTE — Assessment & Plan Note (Signed)
stable overall by hx and exam, most recent lab reviewed with pt, and pt to continue medical treatment as before  Lab Results  Component Value Date   LDLCALC 96 10/13/2010

## 2010-10-17 NOTE — Assessment & Plan Note (Addendum)
stable overall by hx and exam, most recent lab reviewed with pt, and pt to continue medical treatment as before; verified non suicidal,  Lab Results  Component Value Date   WBC 8.4 07/13/2010   HGB 11.4* 07/13/2010   HCT 34.0* 07/13/2010   PLT 393.0 07/13/2010   CHOL 170 10/13/2010   TRIG 147.0 10/13/2010   HDL 45.10 10/13/2010   LDLDIRECT 139.8 07/13/2010   ALT 17 07/13/2010   AST 18 07/13/2010   NA 139 10/13/2010   K 4.3 10/13/2010   CL 102 10/13/2010   CREATININE 0.7 10/13/2010   BUN 10 10/13/2010   CO2 29 10/13/2010   TSH 1.20 07/13/2010   INR 1.08 07/23/2009   HGBA1C 10.0* 10/13/2010   MICROALBUR 0.3 07/13/2010

## 2010-10-27 ENCOUNTER — Other Ambulatory Visit: Payer: Self-pay | Admitting: Internal Medicine

## 2010-10-27 MED ORDER — GLIMEPIRIDE 4 MG PO TABS: 4.0000 mg | ORAL_TABLET | Freq: Two times a day (BID) | ORAL | Status: DC

## 2010-10-27 NOTE — Op Note (Signed)
NAME:  Rachel [name], Rachel [name]             ACCOUNT NO.:  [id number]   MEDICAL RECORD NO.:  [id number]          [name] TYPE:  AMB   LOCATION:  SDC                           FACILITY:  [facility]   PHYSICIAN:  Almedia Balls. Fore, M.D.   DATE OF BIRTH:  30-Aug-1965   DATE OF PROCEDURE:  06/19/2007  DATE OF DISCHARGE:                               OPERATIVE REPORT   PREOPERATIVE DIAGNOSIS:  Abnormal uterine bleeding, slight uterine  enlargement.   POSTOPERATIVE DIAGNOSIS:  Abnormal uterine bleeding, slight uterine  enlargement, pending pathology.   OPERATION:  Diagnostic hysteroscopy, fractional D&C.   ANESTHESIA:  MAC with 10 mL 1% lidocaine paracervical block.   OPERATOR:  Almedia Balls. Rachel [name], M.D.   INDICATIONS FOR SURGERY:  The [name] is a 45 year old with the above-  noted problems who has been counseled as to the need for surgery to  treat these problems.  She has been fully counseled as to the nature of  the procedure and the risks involved including risks of anesthesia;  injury to uterus, tubes, ovaries, bowel, bladder, blood vessels,  ureters; postoperative hemorrhage; infection; and recuperation.  She  fully understands all of these considerations and has signed informed  consent to proceed on June 19, 2007.   OPERATIVE FINDINGS:  On bimanual exam, the uterus was mid posterior, top  normal size.  There were no palpable adnexal masses.  On hysteroscopy,  the endocervical canal was clean.  The uterus sounded to approximately 7  cm.  Endocervical canal was quite tortuous with several twists to it,  making it difficult to adequately visualize the endometrial cavity using  the straight hysteroscope.  Because of the difficulty in visualization,  dilation was carried out only minimally.  It was necessary to use a  Pipelle type of endometrial biopsy device to obtain any tissue from the  endometrial cavity.   PROCEDURE:  With the [name] under sedation, prepared and draped in  usual sterile  fashion, speculum was placed in the vagina.  A solution of  1% lidocaine was injected at the 2, 4, 8, 10 and 12 o'clock positions  for a total of 10 mL for paracervical block.  Single-tooth tenaculum was  placed at the 12 o'clock position, and a small sharp curette was used  for curettage of the endocervical canal.  An attempt to sound the uterus  was carried out with findings of very short sounding depth.  Accordingly, the hysteroscope was employed after some dilation of the  cervix to ascertain the direction of the endocervical canal which was as  noted above.  Because of the tortuous nature of the canal, full dilation  was not possible.  The Pipelle type of endometrial biopsy device was  utilized to negotiate the tortuous nature of the endocervical canal, and  a small amount of tissue was obtained from the endometrial cavity.  Hysteroscope was employed several times to ascertain whether there was  any problem with the canal itself.  This did not appear to be the case.  After final use of the hysteroscope to ensure that all hemostasis was  maintained and that sponge and  instrument counts were correct, the  procedure was terminated.  Estimated blood loss less than 25 mL.  The  [name] was taken to the recovery room in good condition.   FOLLOW-UP CARE:  She is to return to the office in 2 weeks for follow-up  and to call if heavy bleeding, pain or unexplained fevers should ensue.  She will take over-the-counter medications for pain.           ______________________________  Almedia Balls. Rachel [name], M.D.     SRF/MEDQ  D:  06/19/2007  T:  06/19/2007  Job:  [id number]   cc:   Leatha Gilding. Mezer, M.D.  Fax: [phone]

## 2011-01-15 ENCOUNTER — Ambulatory Visit: Payer: 59 | Admitting: Internal Medicine

## 2011-01-15 ENCOUNTER — Other Ambulatory Visit (INDEPENDENT_AMBULATORY_CARE_PROVIDER_SITE_OTHER): Payer: 59

## 2011-01-15 DIAGNOSIS — Z0000 Encounter for general adult medical examination without abnormal findings: Secondary | ICD-10-CM

## 2011-01-15 DIAGNOSIS — E119 Type 2 diabetes mellitus without complications: Secondary | ICD-10-CM

## 2011-01-15 DIAGNOSIS — D539 Nutritional anemia, unspecified: Secondary | ICD-10-CM

## 2011-01-15 DIAGNOSIS — Z862 Personal history of diseases of the blood and blood-forming organs and certain disorders involving the immune mechanism: Secondary | ICD-10-CM

## 2011-01-15 DIAGNOSIS — Z8639 Personal history of other endocrine, nutritional and metabolic disease: Secondary | ICD-10-CM

## 2011-01-15 LAB — IBC PANEL
Iron: 61 ug/dL (ref 42–145)
Saturation Ratios: 14.2 % — ABNORMAL LOW (ref 20.0–50.0)
Transferrin: 306.6 mg/dL (ref 212.0–360.0)

## 2011-01-15 LAB — BASIC METABOLIC PANEL
BUN: 10 mg/dL (ref 6–23)
CO2: 28 mEq/L (ref 19–32)
Calcium: 8.9 mg/dL (ref 8.4–10.5)
Chloride: 105 mEq/L (ref 96–112)
Creatinine, Ser: 0.6 mg/dL (ref 0.4–1.2)
GFR: 105.88 mL/min (ref >60.00)
Glucose, Bld: 182 mg/dL — ABNORMAL HIGH (ref 70–99)
Potassium: 4 mEq/L (ref 3.5–5.1)
Sodium: 140 mEq/L (ref 135–145)

## 2011-01-15 LAB — URINALYSIS, ROUTINE W REFLEX MICROSCOPIC
Bilirubin Urine: NEGATIVE
Hgb urine dipstick: NEGATIVE
Ketones, ur: NEGATIVE
Nitrite: NEGATIVE
Specific Gravity, Urine: 1.025 (ref 1.000–1.030)
Total Protein, Urine: NEGATIVE
Urine Glucose: 250
Urobilinogen, UA: 0.2 (ref 0.0–1.0)
pH: 6 (ref 5.0–8.0)

## 2011-01-15 LAB — MICROALBUMIN / CREATININE URINE RATIO
Creatinine,U: 121.3 mg/dL
Microalb Creat Ratio: 0.4 mg/g (ref 0.0–30.0)
Microalb, Ur: 0.5 mg/dL (ref 0.0–1.9)

## 2011-01-15 LAB — HEPATIC FUNCTION PANEL
ALT: 21 U/L (ref 0–35)
AST: 20 U/L (ref 0–37)
Albumin: 4 g/dL (ref 3.5–5.2)
Alkaline Phosphatase: 60 U/L (ref 39–117)
Bilirubin, Direct: 0.1 mg/dL (ref 0.0–0.3)
Total Bilirubin: 0.8 mg/dL (ref 0.3–1.2)
Total Protein: 7.2 g/dL (ref 6.0–8.3)

## 2011-01-15 LAB — CBC WITH DIFFERENTIAL/PLATELET
Basophils Absolute: 0 10*3/uL (ref 0.0–0.1)
Basophils Relative: 0.6 % (ref 0.0–3.0)
Eosinophils Absolute: 0.1 10*3/uL (ref 0.0–0.7)
Eosinophils Relative: 1.7 % (ref 0.0–5.0)
HCT: 33.6 % — ABNORMAL LOW (ref 36.0–46.0)
Hemoglobin: 10.9 g/dL — ABNORMAL LOW (ref 12.0–15.0)
Lymphocytes Relative: 48.7 % — ABNORMAL HIGH (ref 12.0–46.0)
Lymphs Abs: 3.9 10*3/uL (ref 0.7–4.0)
MCHC: 32.3 g/dL (ref 30.0–36.0)
MCV: 77.7 fl — ABNORMAL LOW (ref 78.0–100.0)
Monocytes Absolute: 0.7 10*3/uL (ref 0.1–1.0)
Monocytes Relative: 8.9 % (ref 3.0–12.0)
Neutro Abs: 3.2 10*3/uL (ref 1.4–7.7)
Neutrophils Relative %: 40.1 % — ABNORMAL LOW (ref 43.0–77.0)
Platelets: 388 10*3/uL (ref 150.0–400.0)
RBC: 4.32 Mil/uL (ref 3.87–5.11)
RDW: 14.6 % (ref 11.5–14.6)
WBC: 8 10*3/uL (ref 4.5–10.5)

## 2011-01-15 LAB — LIPID PANEL
Cholesterol: 178 mg/dL (ref 0–200)
HDL: 55 mg/dL (ref >39.00)
LDL Cholesterol: 104 mg/dL — ABNORMAL HIGH (ref 0–99)
Total CHOL/HDL Ratio: 3
Triglycerides: 97 mg/dL (ref 0.0–149.0)
VLDL: 19.4 mg/dL (ref 0.0–40.0)

## 2011-01-15 LAB — B12 AND FOLATE PANEL
Folate: 18.2 ng/mL (ref >5.9)
Vitamin B-12: 531 pg/mL (ref 211–911)

## 2011-01-15 LAB — HEMOGLOBIN A1C: Hgb A1c MFr Bld: 9.8 % — ABNORMAL HIGH (ref 4.6–6.5)

## 2011-01-15 LAB — TSH: TSH: 0.71 u[IU]/mL (ref 0.35–5.50)

## 2011-01-21 ENCOUNTER — Encounter: Payer: Self-pay | Admitting: Internal Medicine

## 2011-01-21 ENCOUNTER — Ambulatory Visit (INDEPENDENT_AMBULATORY_CARE_PROVIDER_SITE_OTHER): Payer: 59 | Admitting: Internal Medicine

## 2011-01-21 VITALS — BP 100/62 | HR 78 | Temp 99.1°F | Ht 61.0 in | Wt 157.2 lb

## 2011-01-21 DIAGNOSIS — R6882 Decreased libido: Secondary | ICD-10-CM

## 2011-01-21 DIAGNOSIS — R5381 Other malaise: Secondary | ICD-10-CM

## 2011-01-21 DIAGNOSIS — E119 Type 2 diabetes mellitus without complications: Secondary | ICD-10-CM

## 2011-01-21 DIAGNOSIS — D509 Iron deficiency anemia, unspecified: Secondary | ICD-10-CM | POA: Insufficient documentation

## 2011-01-21 DIAGNOSIS — R5383 Other fatigue: Secondary | ICD-10-CM

## 2011-01-21 DIAGNOSIS — R635 Abnormal weight gain: Secondary | ICD-10-CM

## 2011-01-21 DIAGNOSIS — M5412 Radiculopathy, cervical region: Secondary | ICD-10-CM | POA: Insufficient documentation

## 2011-01-21 DIAGNOSIS — Z0000 Encounter for general adult medical examination without abnormal findings: Secondary | ICD-10-CM

## 2011-01-21 DIAGNOSIS — D649 Anemia, unspecified: Secondary | ICD-10-CM

## 2011-01-21 HISTORY — DX: Anemia, unspecified: D64.9

## 2011-01-21 HISTORY — DX: Radiculopathy, cervical region: M54.12

## 2011-01-21 MED ORDER — PHENTERMINE HCL 30 MG PO CAPS: 30.0000 mg | ORAL_CAPSULE | ORAL | Status: AC

## 2011-01-21 NOTE — Assessment & Plan Note (Signed)

## 2011-01-21 NOTE — Assessment & Plan Note (Signed)
Mod uncontrolled - actos not covered by insurance, to cont meds, refer endo for difficult/labile sugars

## 2011-01-21 NOTE — Progress Notes (Signed)
Subjective:    Patient ID: Rachel [name], female    DOB: 03-27-1966, 45 y.o.   MRN: [id number]  HPI  Here for wellness and f/u;  Overall doing ok;  Pt denies CP, worsening SOB, DOE, wheezing, orthopnea, PND, worsening LE edema, palpitations, dizziness or syncope.  Pt denies neurological change such as new Headache, facial or extremity weakness.  Pt denies polydipsia, polyuria, or low sugar symptoms. Pt states overall good compliance with treatment and medications, good tolerability, and trying to follow lower cholesterol diet.  Pt denies worsening depressive symptoms, suicidal ideation or panic. No fever, wt loss, night sweats, loss of appetite, or other constitutional symptoms.  Pt states good ability with ADL's, low fall risk, home safety reviewed and adequate, no significant changes in hearing or vision, and occasionally active with exercise only.  Frustrated with recent wt gain, c/o ongoing fatigue, lower mood, decreased libido, and some tearful today.  No overt bleeding or bruising.  Has recently occasional left neck pain with radiation to the left shoulder, upper left chest (recent card cath neg) and more distal arm.  Has declned tx for depression in the past.  Husband is a Warden/ranger, and suggested counsling might be of benefit and she now agrees.  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 Past Medical History  Diagnosis Date  . DIABETES MELLITUS, TYPE II 08/02/2007  . HYPERLIPIDEMIA 08/02/2007  . DEPRESSION, MILD 01/09/2010  . COMMON MIGRAINE 05/15/2009  . ALLERGIC RHINITIS 10/04/2007  . ASTHMA 08/02/2007  . GERD 08/02/2007  . INTERMITTENT VERTIGO 05/15/2009  . INSOMNIA-SLEEP DISORDER-UNSPEC 01/04/2008  . LIBIDO, DECREASED 01/09/2010   Past Surgical History  Procedure Date  . Cesarean section   . S/p left knee surgury     reports that she has never smoked. She  does not have any smokeless tobacco history on file. She reports that she does not drink alcohol or use illicit drugs. family history includes Asthma in her father; Cancer in her other; Diabetes in her father; and Hypertension in her father and mother. Allergies  Allergen Reactions  . Atorvastatin     REACTION: myalgias  . Nitroglycerin    Current Outpatient Prescriptions on File Prior to Visit  Medication Sig Dispense Refill  . aspirin 81 MG EC tablet Take 81 mg by mouth daily.        . Blood Glucose Monitoring Suppl (TRUERESULT BLOOD GLUCOSE) W/DEVICE KIT Use as directed       . esomeprazole (NEXIUM) 40 MG capsule Take 40 mg by mouth daily.        Marland Kitchen glimepiride (AMARYL) 4 MG tablet Take 1 tablet (4 mg total) by mouth 2 (two) times daily.  180 tablet  3  . glucose blood (ONE TOUCH ULTRA TEST) test strip 2 (two) times daily. Use as instructed       . glucose blood (TRUETEST TEST) test strip Use as directed       . metFORMIN (GLUCOPHAGE) 500 MG tablet Take 2 tablets (1,000 mg total) by mouth 2 (two) times daily with a meal.  360 tablet  3  . simvastatin (ZOCOR) 40 MG tablet Take 40 mg by mouth daily.        . sitaGLIPtan (JANUVIA) 100 MG tablet Take 100 mg by mouth daily.        . budesonide-formoterol (SYMBICORT) 160-4.5 MCG/ACT inhaler Inhale  2 puffs into the lungs 2 (two) times daily.         Review of Systems Review of Systems  Constitutional: Negative for diaphoresis, activity change, appetite change and unexpected weight change.  HENT: Negative for hearing loss, ear pain, facial swelling, mouth sores and neck stiffness.   Eyes: Negative for pain, redness and visual disturbance.  Respiratory: Negative for shortness of breath and wheezing.   Cardiovascular: Negative for chest pain and palpitations.  Gastrointestinal: Negative for diarrhea, blood in stool, abdominal distention and rectal pain.  Genitourinary: Negative for hematuria, flank pain and decreased urine volume.    Musculoskeletal: Negative for myalgias and joint swelling.  Skin: Negative for color change and wound.  Neurological: Negative for syncope and numbness.  Hematological: Negative for adenopathy.  Psychiatric/Behavioral: Negative for hallucinations, self-injury, decreased concentration and agitation.      Objective:   Physical Exam BP 100/62  Pulse 78  Temp(Src) 99.1 F (37.3 C) (Oral)  Ht 5\' 1"  (1.549 m)  Wt 157 lb 4 oz (71.328 kg)  BMI 29.71 kg/m2  SpO2 98% Physical Exam  VS noted, gained wt  Constitutional: Pt is oriented to person, place, and time. Appears well-developed and well-nourished.  HENT:  Head: Normocephalic and atraumatic.  Right Ear: External ear normal.  Left Ear: External ear normal.  Nose: Nose normal.  Mouth/Throat: Oropharynx is clear and moist.  Eyes: Conjunctivae and EOM are normal. Pupils are equal, round, and reactive to light.  Neck: Normal range of motion. Neck supple. No JVD present. No tracheal deviation present.  Cardiovascular: Normal rate, regular rhythm, normal heart sounds and intact distal pulses.   Pulmonary/Chest: Effort normal and breath sounds normal.  Abdominal: Soft. Bowel sounds are normal. There is no tenderness.  Musculoskeletal: Normal range of motion. Exhibits no edema. cspine with mild tender midline at lowest c-spine Lymphadenopathy:  Has no cervical adenopathy.  Neurological: Pt is alert and oriented to person, place, and time. Pt has normal reflexes. No cranial nerve deficit.  Skin: Skin is warm and dry. No rash noted.  Psychiatric:  Has depressed mood and affect. Behavior is normal.        Assessment & Plan:

## 2011-01-21 NOTE — Assessment & Plan Note (Signed)
Labs normal, I suspect related to mild depression, with DM out of control will try limited phentermine asd

## 2011-01-21 NOTE — Assessment & Plan Note (Signed)
prob mild worsening, declines specific tx at this time

## 2011-01-21 NOTE — Assessment & Plan Note (Signed)
Mild persistent, cr/b12/iron normal - for heme referral

## 2011-01-21 NOTE — Patient Instructions (Addendum)
Please remember to followup with your GYN for the yearly pap smear and/or mammogram Take all new medications as prescribed - the phentermine for weight loss Continue all other medications as before You will be contacted regarding the referral for: Hematology for anemia, Endocrinology for Diabetes, Counseling,

## 2011-01-21 NOTE — Assessment & Plan Note (Signed)
I suspect related to her mild depression, but again declines ssri or similar tx, will accept counseling

## 2011-01-21 NOTE — Assessment & Plan Note (Signed)
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

## 2011-02-09 ENCOUNTER — Ambulatory Visit: Payer: 59 | Admitting: Endocrinology

## 2011-02-09 DIAGNOSIS — Z029 Encounter for administrative examinations, unspecified: Secondary | ICD-10-CM

## 2011-02-11 ENCOUNTER — Ambulatory Visit: Payer: 59 | Admitting: Psychology

## 2011-02-19 ENCOUNTER — Encounter: Payer: 59 | Admitting: Hematology and Oncology

## 2011-03-04 LAB — BASIC METABOLIC PANEL
BUN: 9
CO2: 28
Calcium: 9.4
Chloride: 103
Creatinine, Ser: 0.66
GFR calc Af Amer: >60
GFR calc non Af Amer: >60
Glucose, Bld: 137 — ABNORMAL HIGH
Potassium: 4
Sodium: 138

## 2011-03-04 LAB — CBC
HCT: 35.6 — ABNORMAL LOW
Hemoglobin: 11.9 — ABNORMAL LOW
MCHC: 33.5
MCV: 81.5
Platelets: 382
RBC: 4.37
RDW: 13.7
WBC: 7.4

## 2011-03-04 LAB — PREGNANCY, URINE: Preg Test, Ur: NEGATIVE

## 2011-03-19 ENCOUNTER — Telehealth: Payer: Self-pay

## 2011-03-19 NOTE — Telephone Encounter (Signed)
Patient called [employer] requesting status of FMLA forms.

## 2011-03-22 DIAGNOSIS — Z0279 Encounter for issue of other medical certificate: Secondary | ICD-10-CM

## 2011-03-25 LAB — POCT CARDIAC MARKERS
CKMB, poc: <1 — ABNORMAL LOW
Myoglobin, poc: 41.8
Operator id: 272551
Troponin i, poc: <0.05

## 2011-03-25 LAB — I-STAT 8, (EC8 V) (CONVERTED LAB)
Acid-Base Excess: 1
BUN: 11
Bicarbonate: 22.9
Chloride: 108
Glucose, Bld: 106 — ABNORMAL HIGH
HCT: 44
Hemoglobin: 15
Operator id: 272551
Potassium: 4.2
Sodium: 137
TCO2: 24
pCO2, Ven: 28 — ABNORMAL LOW
pH, Ven: 7.521 — ABNORMAL HIGH

## 2011-03-25 LAB — POCT I-STAT CREATININE
Creatinine, Ser: 0.8
Operator id: 272551

## 2011-03-29 ENCOUNTER — Encounter (HOSPITAL_BASED_OUTPATIENT_CLINIC_OR_DEPARTMENT_OTHER): Payer: 59 | Admitting: Hematology and Oncology

## 2011-03-29 ENCOUNTER — Other Ambulatory Visit: Payer: Self-pay | Admitting: Oncology

## 2011-03-29 DIAGNOSIS — D649 Anemia, unspecified: Secondary | ICD-10-CM

## 2011-03-29 LAB — COMPREHENSIVE METABOLIC PANEL
ALT: 42 U/L — ABNORMAL HIGH (ref 0–35)
AST: 43 U/L — ABNORMAL HIGH (ref 0–37)
Albumin: 4.6 g/dL (ref 3.5–5.2)
Alkaline Phosphatase: 76 U/L (ref 39–117)
BUN: 10 mg/dL (ref 6–23)
CO2: 23 mEq/L (ref 19–32)
Calcium: 10 mg/dL (ref 8.4–10.5)
Chloride: 97 mEq/L (ref 96–112)
Creatinine, Ser: 0.76 mg/dL (ref 0.50–1.10)
Glucose, Bld: 333 mg/dL — ABNORMAL HIGH (ref 70–99)
Potassium: 4.5 mEq/L (ref 3.5–5.3)
Sodium: 133 mEq/L — ABNORMAL LOW (ref 135–145)
Total Bilirubin: 0.5 mg/dL (ref 0.3–1.2)
Total Protein: 7.6 g/dL (ref 6.0–8.3)

## 2011-03-29 LAB — IRON AND TIBC
%SAT: 15 % — ABNORMAL LOW (ref 20–55)
Iron: 60 ug/dL (ref 42–145)
TIBC: 410 ug/dL (ref 250–470)
UIBC: 350 ug/dL (ref 125–400)

## 2011-03-29 LAB — CBC & DIFF AND RETIC
BASO%: 0.8 % (ref 0.0–2.0)
Basophils Absolute: 0.1 10*3/uL (ref 0.0–0.1)
EOS%: 3 % (ref 0.0–7.0)
Eosinophils Absolute: 0.2 10*3/uL (ref 0.0–0.5)
HCT: 36.6 % (ref 34.8–46.6)
HGB: 11.9 g/dL (ref 11.6–15.9)
Immature Retic Fract: 4.8 % (ref 1.60–10.00)
LYMPH%: 54.4 % — ABNORMAL HIGH (ref 14.0–49.7)
MCH: 24.5 pg — ABNORMAL LOW (ref 25.1–34.0)
MCHC: 32.5 g/dL (ref 31.5–36.0)
MCV: 75.5 fL — ABNORMAL LOW (ref 79.5–101.0)
MONO#: 0.7 10*3/uL (ref 0.1–0.9)
MONO%: 8.7 % (ref 0.0–14.0)
NEUT#: 2.5 10*3/uL (ref 1.5–6.5)
NEUT%: 33.1 % — ABNORMAL LOW (ref 38.4–76.8)
Platelets: 443 10*3/uL — ABNORMAL HIGH (ref 145–400)
RBC: 4.85 10*6/uL (ref 3.70–5.45)
RDW: 14.5 % (ref 11.2–14.5)
Retic %: 0.9 % (ref 0.70–2.10)
Retic Ct Abs: 43.65 10*3/uL (ref 33.70–90.70)
WBC: 7.6 10*3/uL (ref 3.9–10.3)
lymph#: 4.1 10*3/uL — ABNORMAL HIGH (ref 0.9–3.3)

## 2011-03-29 LAB — MORPHOLOGY: PLT EST: INCREASED

## 2011-03-29 LAB — LACTATE DEHYDROGENASE: LDH: 162 U/L (ref 94–250)

## 2011-03-29 LAB — CHCC SMEAR

## 2011-03-29 LAB — FERRITIN: Ferritin: 17 ng/mL (ref 10–291)

## 2011-04-02 ENCOUNTER — Other Ambulatory Visit: Payer: Self-pay | Admitting: Internal Medicine

## 2011-04-26 ENCOUNTER — Ambulatory Visit: Payer: 59 | Admitting: Gastroenterology

## 2011-05-22 ENCOUNTER — Emergency Department (HOSPITAL_COMMUNITY)
Admission: EM | Admit: 2011-05-22 | Discharge: 2011-05-22 | Disposition: A | Discharge status: Home / Self Care | Payer: 59 | Source: Home / Self Care | Attending: Emergency Medicine | Admitting: Emergency Medicine

## 2011-05-22 ENCOUNTER — Emergency Department (INDEPENDENT_AMBULATORY_CARE_PROVIDER_SITE_OTHER): Payer: 59

## 2011-05-22 ENCOUNTER — Encounter (HOSPITAL_COMMUNITY): Payer: Self-pay | Admitting: *Deleted

## 2011-05-22 DIAGNOSIS — S93409A Sprain of unspecified ligament of unspecified ankle, initial encounter: Secondary | ICD-10-CM

## 2011-05-22 MED ORDER — IBUPROFEN 800 MG PO TABS: 800.0000 mg | ORAL_TABLET | Freq: Three times a day (TID) | ORAL | Status: AC | PRN

## 2011-05-22 NOTE — ED Notes (Signed)
Pt injured right foot/ankle yesterday per pt twisted ankle causing her to fall - yesterday pain but able to ambulate this am increased pain

## 2011-05-22 NOTE — ED Provider Notes (Addendum)
History     CSN: [id number] Arrival date & time: 05/22/2011 11:24 AM   First MD Initiated Contact with Patient 05/22/11 1038      Chief Complaint  Patient presents with  . Ankle Pain  . Foot Pain    (Consider location/radiation/quality/duration/timing/severity/associated sxs/prior treatment) Patient is a 45 y.o. female presenting with ankle pain and lower extremity pain. The history is provided by the patient.  Ankle Pain  The incident occurred at home. The injury mechanism was torsion. The pain is present in the right foot. The pain is at a severity of 7/10. The pain has been constant since onset. Associated symptoms include inability to bear weight. Pertinent negatives include no numbness, no loss of motion, no muscle weakness, no loss of sensation and no tingling. The symptoms are aggravated by bearing weight and palpation. She has tried NSAIDs and ice for the symptoms. The treatment provided no relief.  Foot Pain    Past Medical History  Diagnosis Date  . DIABETES MELLITUS, TYPE II 08/02/2007  . HYPERLIPIDEMIA 08/02/2007  . DEPRESSION, MILD 01/09/2010  . COMMON MIGRAINE 05/15/2009  . ALLERGIC RHINITIS 10/04/2007  . ASTHMA 08/02/2007  . GERD 08/02/2007  . INTERMITTENT VERTIGO 05/15/2009  . INSOMNIA-SLEEP DISORDER-UNSPEC 01/04/2008  . LIBIDO, DECREASED 01/09/2010    Past Surgical History  Procedure Date  . Cesarean section   . S/p left knee surgury     Family History  Problem Relation Age of Onset  . Hypertension Mother   . Hypertension Father   . Diabetes Father   . Asthma Father   . Cancer Other     cervical cancer    History  Substance Use Topics  . Smoking status: Never Smoker   . Smokeless tobacco: Not on file  . Alcohol Use: No    OB History    Grav Para Term Preterm Abortions TAB SAB Ect Mult Living                  Review of Systems  Neurological: Negative for tingling and numbness.    Allergies  Atorvastatin and Nitroglycerin  Home Medications     Current Outpatient Rx  Name Route Sig Dispense Refill  . GLYBURIDE MICRONIZED 4.5 MG PO TABS Oral Take 4 mg by mouth 2 (two) times daily before a meal.      . ASPIRIN 81 MG PO TBEC Oral Take 81 mg by mouth daily.      Henreitta Leber BLOOD GLUCOSE W/DEVICE KIT  Use as directed     . BUDESONIDE-FORMOTEROL FUMARATE 160-4.5 MCG/ACT IN AERO Inhalation Inhale 2 puffs into the lungs 2 (two) times daily.      Marland Kitchen ESOMEPRAZOLE MAGNESIUM 40 MG PO CPDR Oral Take 40 mg by mouth daily.      Marland Kitchen GLIMEPIRIDE 4 MG PO TABS Oral Take 4 mg by mouth 2 (two) times daily.      Marland Kitchen GLUCOSE BLOOD VI STRP  2 (two) times daily. Use as instructed     . GLUCOSE BLOOD VI STRP  Use as directed     . IBUPROFEN 800 MG PO TABS Oral Take 1 tablet (800 mg total) by mouth every 8 (eight) hours as needed for pain. 30 tablet 0  . METFORMIN HCL 500 MG PO TABS Oral Take 2 tablets (1,000 mg total) by mouth 2 (two) times daily with a meal. 360 tablet 3  . SIMVASTATIN 40 MG PO TABS  TAKE 1 TABLET BY MOUTH DAILY 90 tablet 1  . SITAGLIPTIN  PHOSPHATE 100 MG PO TABS Oral Take 100 mg by mouth daily.        BP 119/81  Pulse 86  Temp(Src) 98.6 F (37 C) (Oral)  Resp 17  SpO2 99%  Physical Exam  Nursing note and vitals reviewed. Constitutional: She appears well-developed and well-nourished.  Musculoskeletal:       Right ankle: She exhibits decreased range of motion and swelling. She exhibits no ecchymosis and normal pulse. Achilles tendon exhibits pain. Achilles tendon exhibits no defect and normal Thompson's test results.       Feet:  Neurological: She is alert.  Skin: Skin is warm.    ED Course  Procedures (including critical care time)  Labs Reviewed - No data to display Dg Ankle Complete Right  05/22/2011  *RADIOLOGY REPORT*  Clinical Data:  Ankle injury.  Lateral ankle pain and swelling.  RIGHT ANKLE - COMPLETE 3+ VIEW  Comparison:  01/10/2007  Findings:  There is no evidence of fracture, dislocation, or joint effusion.  There  is no evidence of arthropathy or other focal bone abnormality.  Soft tissues are unremarkable.  IMPRESSION: Negative.  Original Report Authenticated By: Danae Orleans, M.D.     1. Ankle sprain and strain       MDM   R ankle eversion       Jimmie Molly, MD 05/22/11 6213  Jimmie Molly, MD 05/22/11 2309

## 2011-05-22 NOTE — ED Notes (Signed)
Applied ice pack to right ankle per RN

## 2011-06-18 ENCOUNTER — Encounter: Payer: Self-pay | Admitting: Internal Medicine

## 2011-06-18 ENCOUNTER — Other Ambulatory Visit (INDEPENDENT_AMBULATORY_CARE_PROVIDER_SITE_OTHER): Payer: 59

## 2011-06-18 ENCOUNTER — Other Ambulatory Visit: Payer: Self-pay | Admitting: Internal Medicine

## 2011-06-18 ENCOUNTER — Ambulatory Visit (INDEPENDENT_AMBULATORY_CARE_PROVIDER_SITE_OTHER): Payer: 59 | Admitting: Internal Medicine

## 2011-06-18 VITALS — BP 102/74 | HR 93 | Temp 99.3°F | Ht 61.0 in | Wt 153.2 lb

## 2011-06-18 DIAGNOSIS — R42 Dizziness and giddiness: Secondary | ICD-10-CM

## 2011-06-18 DIAGNOSIS — D649 Anemia, unspecified: Secondary | ICD-10-CM

## 2011-06-18 DIAGNOSIS — E119 Type 2 diabetes mellitus without complications: Secondary | ICD-10-CM

## 2011-06-18 DIAGNOSIS — R55 Syncope and collapse: Secondary | ICD-10-CM

## 2011-06-18 LAB — BASIC METABOLIC PANEL
BUN: 9 mg/dL (ref 6–23)
CO2: 29 mEq/L (ref 19–32)
Calcium: 8.7 mg/dL (ref 8.4–10.5)
Chloride: 103 mEq/L (ref 96–112)
Creatinine, Ser: 0.6 mg/dL (ref 0.4–1.2)
GFR: 120.8 mL/min (ref >60.00)
Glucose, Bld: 262 mg/dL — ABNORMAL HIGH (ref 70–99)
Potassium: 3.8 mEq/L (ref 3.5–5.1)
Sodium: 139 mEq/L (ref 135–145)

## 2011-06-18 LAB — CBC WITH DIFFERENTIAL/PLATELET
Basophils Absolute: 0 10*3/uL (ref 0.0–0.1)
Basophils Relative: 0.6 % (ref 0.0–3.0)
Eosinophils Absolute: 0.1 10*3/uL (ref 0.0–0.7)
Eosinophils Relative: 1 % (ref 0.0–5.0)
HCT: 32.7 % — ABNORMAL LOW (ref 36.0–46.0)
Hemoglobin: 10.7 g/dL — ABNORMAL LOW (ref 12.0–15.0)
Lymphocytes Relative: 48.5 % — ABNORMAL HIGH (ref 12.0–46.0)
Lymphs Abs: 3.6 10*3/uL (ref 0.7–4.0)
MCHC: 32.8 g/dL (ref 30.0–36.0)
MCV: 77.3 fl — ABNORMAL LOW (ref 78.0–100.0)
Monocytes Absolute: 0.6 10*3/uL (ref 0.1–1.0)
Monocytes Relative: 8.8 % (ref 3.0–12.0)
Neutro Abs: 3 10*3/uL (ref 1.4–7.7)
Neutrophils Relative %: 41.1 % — ABNORMAL LOW (ref 43.0–77.0)
Platelets: 437 10*3/uL — ABNORMAL HIGH (ref 150.0–400.0)
RBC: 4.23 Mil/uL (ref 3.87–5.11)
RDW: 15.5 % — ABNORMAL HIGH (ref 11.5–14.6)
WBC: 7.4 10*3/uL (ref 4.5–10.5)

## 2011-06-18 LAB — LIPID PANEL
Cholesterol: 209 mg/dL — ABNORMAL HIGH (ref 0–200)
HDL: 44.5 mg/dL (ref >39.00)
Total CHOL/HDL Ratio: 5
Triglycerides: 140 mg/dL (ref 0.0–149.0)
VLDL: 28 mg/dL (ref 0.0–40.0)

## 2011-06-18 LAB — HEMOGLOBIN A1C: Hgb A1c MFr Bld: 11 % — ABNORMAL HIGH (ref 4.6–6.5)

## 2011-06-18 NOTE — Patient Instructions (Addendum)
Continue all other medications as before Please go to LAB in the Basement for the blood and/or urine tests to be done today Please call the phone number [phone] (the [employer] System) for results of testing in 2-3 days;  When calling, simply dial the number, and when prompted enter the MRN number above (the Medical Record Number) and the # key, then the message should start. If the sugar remains hard to improve, we may need to refer to Dr Everardo All (endocrinology) You will be contacted regarding the referral for: Dr Morton [facility] Please keep your appointments with your specialists as you have planned - Dr Gaylyn Rong for the anemia Please return in 6 months, or sooner if needed

## 2011-06-18 NOTE — Assessment & Plan Note (Addendum)
High suspicion for sz disorder; normal card cath last yr; for neuro referral, ECG reviewed as per emr,Continue all other medications as before

## 2011-06-19 ENCOUNTER — Encounter: Payer: Self-pay | Admitting: Internal Medicine

## 2011-06-19 NOTE — Assessment & Plan Note (Addendum)
recetnly not well controlled , most recent data reviewed with pt, and pt to continue medical treatment as before, to check labs, consider endo referral  Lab Results  Component Value Date   HGBA1C 9.8* 01/15/2011

## 2011-06-19 NOTE — Assessment & Plan Note (Signed)
stable overall by hx and exam, most recent data reviewed with pt, and pt to continue medical treatment as before, for lab f/u Lab Results  Component Value Date   WBC 7.6 03/29/2011   HGB 11.9 03/29/2011   HCT 36.6 03/29/2011   MCV 75.5* 03/29/2011   PLT 443* 03/29/2011

## 2011-06-19 NOTE — Progress Notes (Signed)
Subjective:    Patient ID: Rachel [name], female    DOB: 02-May-1966, 46 y.o.   MRN: [id number]  HPI  Here to f/u with c/o dizziness, but on history she states she actually has no dizziness;  Instead she describes 3 falls in the past month syncopal in nature, sudden with LOC, one with bruise/contusion to left periorbital area, and since then has felt somewhat off balance with ambulation;  She has had actually numerous episodes of sudden falls usually while ambulating since her 16's TNTC without severe injury but always without prior assoc symptoms such as dizziness, fainteness and no memory of the actual fall. Out for several minutes at a time, no obvious shaking per pt to suggest siezure, or incontinence.  Is concerned now b/c 3 have happened so close together in time recently.  Has not been eval for this in the past, and states she thought she was just clumsy.   Pt denies chest pain, increased sob or doe, wheezing, orthopnea, PND, increased LE swelling, palpitations.  Pt denies new neurological symptoms such as new headache, or facial or extremity weakness or numbness.   Pt denies polydipsia, polyuria.   Pt denies fever, wt loss, night sweats, loss of appetite, or other constitutional symptoms   No recent change in meds.  Not taking the symbicort further.  Past Medical History  Diagnosis Date  . DIABETES MELLITUS, TYPE II 08/02/2007  . HYPERLIPIDEMIA 08/02/2007  . DEPRESSION, MILD 01/09/2010  . COMMON MIGRAINE 05/15/2009  . ALLERGIC RHINITIS 10/04/2007  . ASTHMA 08/02/2007  . GERD 08/02/2007  . INTERMITTENT VERTIGO 05/15/2009  . INSOMNIA-SLEEP DISORDER-UNSPEC 01/04/2008  . LIBIDO, DECREASED 01/09/2010   Past Surgical History  Procedure Date  . Cesarean section   . S/p left knee surgury     reports that she has never smoked. She does not have any smokeless tobacco history on file. She reports that she does not drink alcohol or use illicit drugs. family history includes Asthma in her father; Cancer in  her other; Diabetes in her father; and Hypertension in her father and mother. Allergies  Allergen Reactions  . Atorvastatin     REACTION: myalgias  . Nitroglycerin    Current Outpatient Prescriptions on File Prior to Visit  Medication Sig Dispense Refill  . aspirin 81 MG EC tablet Take 81 mg by mouth daily.        . Blood Glucose Monitoring Suppl (TRUERESULT BLOOD GLUCOSE) W/DEVICE KIT Use as directed       . esomeprazole (NEXIUM) 40 MG capsule Take 40 mg by mouth daily.        Marland Kitchen glucose blood (ONE TOUCH ULTRA TEST) test strip 2 (two) times daily. Use as instructed       . glucose blood (TRUETEST TEST) test strip Use as directed       . glyBURIDE micronized (GLYNASE) 4.5 MG tablet Take 4 mg by mouth 2 (two) times daily before a meal.        . metFORMIN (GLUCOPHAGE) 500 MG tablet Take 2 tablets (1,000 mg total) by mouth 2 (two) times daily with a meal.  360 tablet  3  . simvastatin (ZOCOR) 40 MG tablet TAKE 1 TABLET BY MOUTH DAILY  90 tablet  1  . sitaGLIPtan (JANUVIA) 100 MG tablet Take 100 mg by mouth daily.        . budesonide-formoterol (SYMBICORT) 160-4.5 MCG/ACT inhaler Inhale 2 puffs into the lungs 2 (two) times daily.        Marland Kitchen  glimepiride (AMARYL) 4 MG tablet Take 4 mg by mouth 2 (two) times daily.         Review of Systems Review of Systems  Constitutional: Negative for diaphoresis and unexpected weight change.  HENT: Negative for drooling and tinnitus.   Eyes: Negative for photophobia and visual disturbance.  Respiratory: Negative for choking and stridor.   Gastrointestinal: Negative for vomiting and blood in stool.  Genitourinary: Negative for hematuria and decreased urine volume.    Objective:   Physical Exam BP 102/74  Pulse 93  Temp(Src) 99.3 F (37.4 C) (Oral)  Ht 5\' 1"  (1.549 m)  Wt 153 lb 4 oz (69.514 kg)  BMI 28.96 kg/m2  SpO2 98% Physical Exam  VS noted Constitutional: Pt appears well-developed and well-nourished.  HENT: Head: Normocephalic.  Right Ear:  External ear normal.  Left Ear: External ear normal.  Eyes: Conjunctivae and EOM are normal. Pupils are equal, round, and reactive to light.  Neck: Normal range of motion. Neck supple.  Cardiovascular: Normal rate and regular rhythm.   Pulmonary/Chest: Effort normal and breath sounds normal.  Abd:  Soft, NT, non-distended, + BS Neurological: Pt is alert. No cranial nerve deficit.  Skin: Skin is warm. No erythema.  Psychiatric: Pt behavior is normal. Thought content normal.  Assessment & Plan:

## 2011-06-21 LAB — LDL CHOLESTEROL, DIRECT: Direct LDL: 142.8 mg/dL

## 2011-06-22 ENCOUNTER — Ambulatory Visit: Payer: 59 | Admitting: Internal Medicine

## 2011-06-28 ENCOUNTER — Other Ambulatory Visit: Payer: 59 | Admitting: Lab

## 2011-06-28 ENCOUNTER — Ambulatory Visit: Payer: 59 | Admitting: Neurology

## 2011-06-29 ENCOUNTER — Other Ambulatory Visit: Payer: Self-pay | Admitting: Obstetrics and Gynecology

## 2011-06-29 DIAGNOSIS — Z1231 Encounter for screening mammogram for malignant neoplasm of breast: Secondary | ICD-10-CM

## 2011-07-14 ENCOUNTER — Ambulatory Visit
Admission: RE | Admit: 2011-07-14 | Discharge: 2011-07-14 | Disposition: A | Discharge status: Home / Self Care | Payer: 59 | Source: Ambulatory Visit | Attending: Obstetrics and Gynecology | Admitting: Obstetrics and Gynecology

## 2011-07-14 DIAGNOSIS — Z1231 Encounter for screening mammogram for malignant neoplasm of breast: Secondary | ICD-10-CM

## 2011-07-27 ENCOUNTER — Ambulatory Visit: Payer: 59 | Admitting: Internal Medicine

## 2011-07-30 ENCOUNTER — Encounter: Payer: Self-pay | Admitting: Neurology

## 2011-07-30 ENCOUNTER — Ambulatory Visit (INDEPENDENT_AMBULATORY_CARE_PROVIDER_SITE_OTHER): Payer: 59 | Admitting: Neurology

## 2011-07-30 VITALS — BP 98/70 | HR 76 | Wt 149.7 lb

## 2011-07-30 DIAGNOSIS — R569 Unspecified convulsions: Secondary | ICD-10-CM

## 2011-07-30 NOTE — Progress Notes (Signed)
Dear Dr. Jonny Ruiz,  Thank you for having me see Rachel [name] in consultation today at [facility] for her problem with falls.  As you may recall, she is a 46 y.o. year old female with a history of diabetes, migraine who presents with a recent history of 3 falls, one occuring in November and two in December.  She feels like she is "crumpling to the ground".  She does not admit to LOC.  She is immediately normal after the fall.  She has had these events since she was about 46 years old(but probably before).  They are not provoked, and rarely preceded by any feeling.  With one she had a sensation of vertigo before hand but this is rare.  She has fractured her nose and recently hurt her ankle with these falls.   They are nor provoked by emotion.  She also endorses vertigo, that occurs unprovoked lasting about 1 minute.  It happens at least once per day.  Her migraines occur about 1-2 per month.  EKG and heart cath were normal.  Past Medical History  Diagnosis Date  . DIABETES MELLITUS, TYPE II 08/02/2007  . HYPERLIPIDEMIA 08/02/2007  . DEPRESSION, MILD 01/09/2010  . COMMON MIGRAINE 05/15/2009  . ALLERGIC RHINITIS 10/04/2007  . ASTHMA 08/02/2007  . GERD 08/02/2007  . INTERMITTENT VERTIGO 05/15/2009  . INSOMNIA-SLEEP DISORDER-UNSPEC 01/04/2008  . LIBIDO, DECREASED 01/09/2010    Past Surgical History  Procedure Date  . Cesarean section   . S/p left knee surgury     History   Social History  . Marital Status: Married    Spouse Name: N/A    Number of Children: 3  . Years of Education: N/A   Occupational History  . NURSE [employer]    Social History Main Topics  . Smoking status: Never Smoker   . Smokeless tobacco: Never Used  . Alcohol Use: No  . Drug Use: No  . Sexually Active: None   Other Topics Concern  . None   Social History Narrative   She has 3 daughters, 82, 59, 87 yo lives with her.    Family History  Problem Relation Age of Onset  . Hypertension Mother   . Hypertension  Father   . Diabetes Father   . Asthma Father   . Cancer Other     cervical cancer  - no history of seizures.  Current Outpatient Prescriptions on File Prior to Visit  Medication Sig Dispense Refill  . Blood Glucose Monitoring Suppl (TRUERESULT BLOOD GLUCOSE) W/DEVICE KIT Use as directed       . esomeprazole (NEXIUM) 40 MG capsule Take 40 mg by mouth daily.        Marland Kitchen glimepiride (AMARYL) 4 MG tablet Take 4 mg by mouth 2 (two) times daily.        Marland Kitchen glucose blood (ONE TOUCH ULTRA TEST) test strip 2 (two) times daily. Use as instructed       . glucose blood (TRUETEST TEST) test strip Use as directed       . glyBURIDE micronized (GLYNASE) 4.5 MG tablet Take 4 mg by mouth 2 (two) times daily before a meal.        . metFORMIN (GLUCOPHAGE) 500 MG tablet Take 2 tablets (1,000 mg total) by mouth 2 (two) times daily with a meal.  360 tablet  3  . simvastatin (ZOCOR) 40 MG tablet TAKE 1 TABLET BY MOUTH DAILY  90 tablet  1  . sitaGLIPtan (JANUVIA) 100 MG tablet Take  100 mg by mouth daily.        Marland Kitchen aspirin 81 MG EC tablet Take 81 mg by mouth daily.          Allergies  Allergen Reactions  . Atorvastatin     REACTION: myalgias  . Nitroglycerin       ROS:  13 systems were reviewed and are notable for left ear pain that is new, asthma, headaches, anemia.  All other review of systems are unremarkable.   Examination:  Filed Vitals:   07/30/11 0833  BP: 98/70  Pulse: 76  Weight: 149 lb 11.2 oz (67.903 kg)     In general, well appearing middle aged women.  H&N:  TM appear normal.  Cardiovascular: The patient has a regular rate and rhythm and no carotid bruits.  Fundoscopy:  Disks are flat. Vessel caliber within normal limits.  Mental status:   The patient is oriented to person, place and time. Recent and remote memory are intact. Attention span and concentration are normal. Language including repetition, naming, following commands are intact. Fund of knowledge of current and historical  events, as well as vocabulary are normal.  Cranial Nerves: Pupils are equally round and reactive to light. Visual fields full to confrontation. Extraocular movements are intact without nystagmus. Facial sensation and muscles of mastication are intact. Muscles of facial expression are symmetric. Hearing intact to bilateral finger rub. Tongue protrusion, uvula, palate midline.  Shoulder shrug intact  Motor:  The patient has normal bulk and tone, no pronator drift.  There are no adventitious movements.  5/5 muscle strength bilaterally.  Reflexes:   Biceps  Triceps Brachioradialis Knee Ankle  Right 2+  2+  2+   2+ 2+  Left  2+  2+  2+   2+ 1+  Toes down  Coordination:  Normal finger to nose.  No dysdiadokinesia.  Sensation is intact to vibration symmetrically.  Gait and Station are normal.  Tandem gait is intact.  Romberg is negative  Vest:  - Dix Hallpike; - head thrust  Impression/Recs: Falls that sound like onset of sudden decreased tone.  It is unusual for seizures to cause sudden falls like this, but there are instances where this can occur.  I am going to start with an EEG and MRI brain.  As for her vertigo, I am not sure this is related but it is possible this represents a seizure of some kind as well(temporal lobe seizures can cause vertigo.)     We will see the patient back in 6 weeks.  Thank you for having [place] see Rachel [name] in consultation.  Feel free to contact me with any questions.  Lupita Raider Modesto Charon, MD [facility], [place] [address] [place], [place] [address] Phone: [phone] Fax: [phone].

## 2011-07-30 NOTE — Patient Instructions (Addendum)
Your EEG is scheduled at [facility] on Tues., Feb. 19th at 12 noon. Please arrive at first floor admitting fifteen minutes prior to your appointment. [phone]   Your MRI is scheduled at [facility] located at [address] in [place] on 2-18 at 1:00 pm.  Please arrive 15 minutes prior to your scheduled appointment time.     [phone].

## 2011-08-02 ENCOUNTER — Ambulatory Visit
Admission: RE | Admit: 2011-08-02 | Discharge: 2011-08-02 | Disposition: A | Discharge status: Home / Self Care | Payer: 59 | Source: Ambulatory Visit | Attending: Neurology | Admitting: Neurology

## 2011-08-02 DIAGNOSIS — R569 Unspecified convulsions: Secondary | ICD-10-CM

## 2011-08-03 ENCOUNTER — Ambulatory Visit (HOSPITAL_COMMUNITY)
Admission: RE | Admit: 2011-08-03 | Discharge: 2011-08-03 | Disposition: A | Discharge status: Home / Self Care | Payer: 59 | Source: Ambulatory Visit | Attending: Neurology | Admitting: Neurology

## 2011-08-03 DIAGNOSIS — Z1389 Encounter for screening for other disorder: Secondary | ICD-10-CM | POA: Insufficient documentation

## 2011-08-03 DIAGNOSIS — R569 Unspecified convulsions: Secondary | ICD-10-CM | POA: Insufficient documentation

## 2011-08-05 ENCOUNTER — Other Ambulatory Visit: Payer: Self-pay | Admitting: Neurology

## 2011-08-05 ENCOUNTER — Telehealth: Payer: Self-pay | Admitting: Neurology

## 2011-08-05 DIAGNOSIS — R569 Unspecified convulsions: Secondary | ICD-10-CM

## 2011-08-05 DIAGNOSIS — R9389 Abnormal findings on diagnostic imaging of other specified body structures: Secondary | ICD-10-CM

## 2011-08-05 NOTE — Telephone Encounter (Signed)
Spoke to Rachel [name] about her MRI and the finding of the right parotid lesion on MRI.    Jan - can you setup an referral to ENT(whomever you think best), for ?right parotid lesion on MRI .  Also, if you can let Rachel [name] know her EEG was normal that would be great.  HOwever, I would like to setup a sleep deprived EEG too for her give her normal routine EEG.  Thanks,  [employer]

## 2011-08-05 NOTE — Procedures (Signed)
  This routine EEG was requested in this 46 year old woman who is having spells of falling.  She also has type 2 diabetes.  She is on no anticonvulsant medication.  The EEG was done with the patient awake.  During periods of maximal wakefulness, she had 11 cycle per second posterior dominant rhythm that attenuated with eye opening and was symmetric.  Background activities were characterized by low-amplitude alpha and beta activities that were symmetric.  Photic stimulation produced symmetric driving response. Hyperventilation is performed for 3 minutes with good effort did not markedly change the tracing.  The patient did not sleep.  CLINICAL INTERPRETATION:  This routine EEG done with the patient awake is normal.          ______________________________ Denton Meek, MD    XB:JYNW D:  08/05/2011 11:59:39  T:  08/05/2011 12:12:07  Job #:  [id number]

## 2011-08-05 NOTE — Telephone Encounter (Signed)
Called and spoke with the patient. Aware of her ENT appointment with Dr. Christia Reading at [facility] on Tuesday, February 26th at 3:00 pm to arrive at 2:45 pm. She was given the address and phone number of the office and that she needed to bring her insurance card, co pay and list of medications. All information faxed to their office by me to [phone]. Patient then informed that her routine EEG was normal and that Dr. Modesto Charon ordered a SD EEG for her. That is scheduled on Friday, March 1st at 0800 at [facility]; to arrive 0745 to the first floor admitting area. Instructed to stay up from midnight on until test time and to not consume caffeine. The patient states she understands.

## 2011-08-13 ENCOUNTER — Ambulatory Visit (HOSPITAL_COMMUNITY)
Admission: RE | Admit: 2011-08-13 | Discharge: 2011-08-13 | Disposition: A | Discharge status: Home / Self Care | Payer: 59 | Source: Ambulatory Visit | Attending: Neurology | Admitting: Neurology

## 2011-08-13 ENCOUNTER — Other Ambulatory Visit: Payer: Self-pay | Admitting: Internal Medicine

## 2011-08-13 DIAGNOSIS — R569 Unspecified convulsions: Secondary | ICD-10-CM

## 2011-08-13 DIAGNOSIS — R9389 Abnormal findings on diagnostic imaging of other specified body structures: Secondary | ICD-10-CM | POA: Insufficient documentation

## 2011-08-13 DIAGNOSIS — Z72820 Sleep deprivation: Secondary | ICD-10-CM | POA: Insufficient documentation

## 2011-08-13 DIAGNOSIS — Z9181 History of falling: Secondary | ICD-10-CM | POA: Insufficient documentation

## 2011-08-16 NOTE — Procedures (Signed)
This sleep-deprived EEG was requested in this 46 year old woman who is having spells of falling.  He is on no anticonvulsive medication.  The EEG was done with the patient awake, drowsy, and asleep.  During periods of maximal wakefulness, she had 10 cycle per second posterior dominant rhythm that attenuated with eye opening and was symmetric. Background activities composed of low amplitude beta activities that were symmetric.  Photic stimulation was not performed.  Hyperventilation did not markedly change the tracing.  The patient spent much of the tracing asleep.  This was characterized by attenuation of the alpha rhythm and muscle activity.  There were some bursts of slower delta and theta activities that were symmetric.  Vertex sharp waves and sleep spindles as well as K complexes were seen that were symmetric.  CLINICAL INTERPRETATION:  This sleep-deprived EEG done with the patient awake, drowsy, and asleep is normal.          ______________________________ Denton Meek, MD    HQ:IONG D:  08/15/2011 22:23:09  T:  08/16/2011 03:58:31  Job #:  [id number]

## 2011-09-14 ENCOUNTER — Ambulatory Visit: Payer: 59 | Admitting: Neurology

## 2011-09-24 ENCOUNTER — Encounter: Payer: Self-pay | Admitting: Oncology

## 2011-09-27 ENCOUNTER — Other Ambulatory Visit: Payer: 59 | Admitting: Lab

## 2011-09-27 ENCOUNTER — Ambulatory Visit: Payer: 59 | Admitting: Oncology

## 2011-10-01 ENCOUNTER — Other Ambulatory Visit (HOSPITAL_COMMUNITY): Payer: Self-pay | Admitting: Otolaryngology

## 2011-10-05 ENCOUNTER — Encounter (HOSPITAL_COMMUNITY): Payer: Self-pay | Admitting: Pharmacy Technician

## 2011-10-11 ENCOUNTER — Encounter (HOSPITAL_COMMUNITY): Payer: Self-pay

## 2011-10-11 ENCOUNTER — Encounter (HOSPITAL_COMMUNITY)
Admission: RE | Admit: 2011-10-11 | Discharge: 2011-10-11 | Disposition: A | Discharge status: Home / Self Care | Payer: 59 | Source: Ambulatory Visit | Attending: Anesthesiology | Admitting: Anesthesiology

## 2011-10-11 ENCOUNTER — Encounter (HOSPITAL_COMMUNITY)
Admission: RE | Admit: 2011-10-11 | Discharge: 2011-10-11 | Disposition: A | Discharge status: Home / Self Care | Payer: 59 | Source: Ambulatory Visit | Attending: Otolaryngology | Admitting: Otolaryngology

## 2011-10-11 LAB — CBC
HCT: 34.7 % — ABNORMAL LOW (ref 36.0–46.0)
Hemoglobin: 11.1 g/dL — ABNORMAL LOW (ref 12.0–15.0)
MCH: 25.2 pg — ABNORMAL LOW (ref 26.0–34.0)
MCHC: 32 g/dL (ref 30.0–36.0)
MCV: 78.7 fL (ref 78.0–100.0)
Platelets: 481 10*3/uL — ABNORMAL HIGH (ref 150–400)
RBC: 4.41 MIL/uL (ref 3.87–5.11)
RDW: 14.9 % (ref 11.5–15.5)
WBC: 7.9 10*3/uL (ref 4.0–10.5)

## 2011-10-11 LAB — BASIC METABOLIC PANEL
BUN: 9 mg/dL (ref 6–23)
CO2: 27 mEq/L (ref 19–32)
Calcium: 9.6 mg/dL (ref 8.4–10.5)
Chloride: 98 mEq/L (ref 96–112)
Creatinine, Ser: 0.56 mg/dL (ref 0.50–1.10)
GFR calc Af Amer: >90 mL/min (ref >90)
GFR calc non Af Amer: >90 mL/min (ref >90)
Glucose, Bld: 257 mg/dL — ABNORMAL HIGH (ref 70–99)
Potassium: 4 mEq/L (ref 3.5–5.1)
Sodium: 136 mEq/L (ref 135–145)

## 2011-10-11 LAB — SURGICAL PCR SCREEN
MRSA, PCR: NEGATIVE
Staphylococcus aureus: NEGATIVE

## 2011-10-11 LAB — NO BLOOD PRODUCTS

## 2011-10-12 NOTE — Consult Note (Signed)
Anesthesia Chart Review:  Patient is a 46 year old female scheduled for a left parotidectomy on 10/21/11 for a left parotid lesion.  History includes DM2, HLD, non-smoker, migraines, GERD, insomnia, anemia, vertigo, asthma.    Her PCP is Dr. Oliver Barre.  She was recently referred to Neurologist Dr. Denton Meek due to three recent falls.  According to his note, she did not admit to LOC.  She underwent and EEG (08/03/11) and sleep deprived EEG (08/13/11) which were both normal.  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    EKG on 06/18/11 showed SR, low voltage QRS, nonspecific T wave abnormality (primarily anterior leads).  These T wave changes have been present since at least 07/24/09.  She had a cardiac cath on 07/24/09 (Dr. Sharyn Lull) that showed EF 55-60% and minimal CAD (LAD 5-10%, LCX large and patent, OM2 moderate and patent, RCA patent, PDA, PLV, Diag branches, OM1, OM3, OM4 were all small and patent).  CXR on 10/11/11 showed no active lung disease.  Labs reviewed.  Cr 0.56.  Non-fasting glucose 257.  Her med list includes Amaryl, glyburide, metformin, and Januvia.  She will get a CBG on arrival and treated as appropriate. H/H 11.1/34.7.  PLT 481.  Of note, patient has signed that no blood products be administered to her.    Shonna Chock, PA-C

## 2011-10-21 ENCOUNTER — Encounter (HOSPITAL_COMMUNITY): Payer: Self-pay | Admitting: Vascular Surgery

## 2011-10-21 ENCOUNTER — Telehealth: Payer: Self-pay

## 2011-10-21 ENCOUNTER — Ambulatory Visit (HOSPITAL_COMMUNITY): Payer: 59 | Admitting: Vascular Surgery

## 2011-10-21 ENCOUNTER — Observation Stay (HOSPITAL_COMMUNITY)
Admission: RE | Admit: 2011-10-21 | Discharge: 2011-10-22 | Disposition: A | Discharge status: Home / Self Care | Payer: 59 | Source: Ambulatory Visit | Attending: Otolaryngology | Admitting: Otolaryngology

## 2011-10-21 ENCOUNTER — Encounter (HOSPITAL_COMMUNITY)
Admission: RE | Disposition: A | Discharge status: Home / Self Care | Payer: Self-pay | Source: Ambulatory Visit | Attending: Otolaryngology

## 2011-10-21 ENCOUNTER — Encounter (HOSPITAL_COMMUNITY): Payer: Self-pay

## 2011-10-21 DIAGNOSIS — K219 Gastro-esophageal reflux disease without esophagitis: Secondary | ICD-10-CM | POA: Insufficient documentation

## 2011-10-21 DIAGNOSIS — E119 Type 2 diabetes mellitus without complications: Secondary | ICD-10-CM | POA: Insufficient documentation

## 2011-10-21 DIAGNOSIS — E785 Hyperlipidemia, unspecified: Secondary | ICD-10-CM | POA: Insufficient documentation

## 2011-10-21 DIAGNOSIS — Z01812 Encounter for preprocedural laboratory examination: Secondary | ICD-10-CM | POA: Insufficient documentation

## 2011-10-21 DIAGNOSIS — D119 Benign neoplasm of major salivary gland, unspecified: Principal | ICD-10-CM | POA: Insufficient documentation

## 2011-10-21 DIAGNOSIS — Z01818 Encounter for other preprocedural examination: Secondary | ICD-10-CM | POA: Insufficient documentation

## 2011-10-21 HISTORY — PX: PAROTIDECTOMY: SHX2163

## 2011-10-21 LAB — GLUCOSE, CAPILLARY
Glucose-Capillary: 272 mg/dL — ABNORMAL HIGH (ref 70–99)
Glucose-Capillary: 172 mg/dL — ABNORMAL HIGH (ref 70–99)
Glucose-Capillary: 134 mg/dL — ABNORMAL HIGH (ref 70–99)

## 2011-10-21 SURGERY — EXCISION, PAROTID GLAND
Anesthesia: General | Site: Neck | Laterality: Left | Wound class: Clean

## 2011-10-21 MED ORDER — LINAGLIPTIN 5 MG PO TABS
5.0000 mg | ORAL_TABLET | Freq: Every day | ORAL | Status: DC
  Administered 2011-10-21 – 2011-10-22 (×2): 5 mg via ORAL
  Filled 2011-10-21 (×2): qty 1

## 2011-10-21 MED ORDER — HYDROMORPHONE HCL PF 1 MG/ML IJ SOLN
INTRAMUSCULAR | Status: AC
  Filled 2011-10-21: qty 1

## 2011-10-21 MED ORDER — POTASSIUM CHLORIDE IN NACL 20-0.45 MEQ/L-% IV SOLN
INTRAVENOUS | Status: DC
  Administered 2011-10-21 – 2011-10-22 (×2): via INTRAVENOUS
  Filled 2011-10-21 (×3): qty 1000

## 2011-10-21 MED ORDER — BACITRACIN ZINC 500 UNIT/GM EX OINT
1.0000 "application " | TOPICAL_OINTMENT | Freq: Three times a day (TID) | CUTANEOUS | Status: DC
  Administered 2011-10-21 – 2011-10-22 (×2): 1 via TOPICAL
  Filled 2011-10-21: qty 15

## 2011-10-21 MED ORDER — IPRATROPIUM BROMIDE HFA 17 MCG/ACT IN AERS
INHALATION_SPRAY | RESPIRATORY_TRACT | Status: DC | PRN
  Administered 2011-10-21: 2 via RESPIRATORY_TRACT

## 2011-10-21 MED ORDER — ONDANSETRON HCL 4 MG/2ML IJ SOLN
INTRAMUSCULAR | Status: DC | PRN
  Administered 2011-10-21 (×2): 4 mg via INTRAVENOUS

## 2011-10-21 MED ORDER — SCOPOLAMINE 1 MG/3DAYS TD PT72
MEDICATED_PATCH | TRANSDERMAL | Status: DC | PRN
  Administered 2011-10-21: 1 via TRANSDERMAL

## 2011-10-21 MED ORDER — MORPHINE SULFATE 2 MG/ML IJ SOLN
2.0000 mg | INTRAMUSCULAR | Status: DC | PRN
  Administered 2011-10-21: 2 mg via INTRAVENOUS
  Filled 2011-10-21: qty 1

## 2011-10-21 MED ORDER — PROPOFOL 10 MG/ML IV EMUL
INTRAVENOUS | Status: DC | PRN
  Administered 2011-10-21: 20 mg via INTRAVENOUS
  Administered 2011-10-21: 180 mg via INTRAVENOUS

## 2011-10-21 MED ORDER — CEFAZOLIN SODIUM 1-5 GM-% IV SOLN
INTRAVENOUS | Status: DC | PRN
  Administered 2011-10-21: 1 g via INTRAVENOUS

## 2011-10-21 MED ORDER — PROMETHAZINE HCL 25 MG PO TABS: 12.5000 mg | ORAL_TABLET | Freq: Four times a day (QID) | ORAL | Status: DC | PRN

## 2011-10-21 MED ORDER — HYDROCODONE-ACETAMINOPHEN 5-325 MG PO TABS
1.0000 | ORAL_TABLET | ORAL | Status: DC | PRN
  Administered 2011-10-21 – 2011-10-22 (×4): 2 via ORAL
  Filled 2011-10-21 (×4): qty 2

## 2011-10-21 MED ORDER — DIPHENHYDRAMINE HCL 25 MG PO CAPS
25.0000 mg | ORAL_CAPSULE | Freq: Four times a day (QID) | ORAL | Status: DC | PRN
  Administered 2011-10-21: 25 mg via ORAL
  Filled 2011-10-21: qty 1

## 2011-10-21 MED ORDER — MIDAZOLAM HCL 5 MG/5ML IJ SOLN
INTRAMUSCULAR | Status: DC | PRN
  Administered 2011-10-21: 2 mg via INTRAVENOUS

## 2011-10-21 MED ORDER — SUCCINYLCHOLINE CHLORIDE 20 MG/ML IJ SOLN
INTRAMUSCULAR | Status: DC | PRN
  Administered 2011-10-21: 100 mg via INTRAVENOUS

## 2011-10-21 MED ORDER — LORAZEPAM 2 MG/ML IJ SOLN: 1.0000 mg | Freq: Once | INTRAMUSCULAR | Status: DC | PRN

## 2011-10-21 MED ORDER — PROMETHAZINE HCL 25 MG/ML IJ SOLN
INTRAMUSCULAR | Status: AC
  Filled 2011-10-21: qty 1

## 2011-10-21 MED ORDER — CEFAZOLIN SODIUM 1-5 GM-% IV SOLN
INTRAVENOUS | Status: AC
  Filled 2011-10-21: qty 50

## 2011-10-21 MED ORDER — PROMETHAZINE HCL 25 MG/ML IJ SOLN
12.5000 mg | Freq: Four times a day (QID) | INTRAMUSCULAR | Status: DC | PRN
  Administered 2011-10-21: 12.5 mg via INTRAVENOUS

## 2011-10-21 MED ORDER — GLIMEPIRIDE 4 MG PO TABS
4.0000 mg | ORAL_TABLET | Freq: Two times a day (BID) | ORAL | Status: DC
  Administered 2011-10-22: 4 mg via ORAL
  Filled 2011-10-21 (×3): qty 1

## 2011-10-21 MED ORDER — METFORMIN HCL 500 MG PO TABS
1000.0000 mg | ORAL_TABLET | Freq: Two times a day (BID) | ORAL | Status: DC
  Administered 2011-10-21 – 2011-10-22 (×2): 1000 mg via ORAL
  Filled 2011-10-21 (×4): qty 2

## 2011-10-21 MED ORDER — METOCLOPRAMIDE HCL 5 MG/ML IJ SOLN
INTRAMUSCULAR | Status: DC | PRN
  Administered 2011-10-21: 10 mg via INTRAVENOUS

## 2011-10-21 MED ORDER — SIMVASTATIN 40 MG PO TABS
40.0000 mg | ORAL_TABLET | Freq: Every evening | ORAL | Status: DC
  Administered 2011-10-21: 40 mg via ORAL
  Filled 2011-10-21 (×2): qty 1

## 2011-10-21 MED ORDER — CEFAZOLIN SODIUM 1-5 GM-% IV SOLN
1.0000 g | Freq: Three times a day (TID) | INTRAVENOUS | Status: DC
  Administered 2011-10-21 – 2011-10-22 (×3): 1 g via INTRAVENOUS
  Filled 2011-10-21 (×4): qty 50

## 2011-10-21 MED ORDER — INSULIN ASPART 100 UNIT/ML ~~LOC~~ SOLN
SUBCUTANEOUS | Status: DC | PRN
  Administered 2011-10-21: 6 [IU] via SUBCUTANEOUS

## 2011-10-21 MED ORDER — SCOPOLAMINE 1 MG/3DAYS TD PT72
1.0000 | MEDICATED_PATCH | Freq: Once | TRANSDERMAL | Status: DC
  Filled 2011-10-21: qty 1

## 2011-10-21 MED ORDER — GLYBURIDE MICRONIZED 3 MG PO TABS: 4.0000 mg | ORAL_TABLET | Freq: Two times a day (BID) | ORAL | Status: DC

## 2011-10-21 MED ORDER — PROMETHAZINE HCL 25 MG/ML IJ SOLN
6.2500 mg | INTRAMUSCULAR | Status: DC | PRN
  Administered 2011-10-21: 12.5 mg via INTRAVENOUS

## 2011-10-21 MED ORDER — PHENYLEPHRINE HCL 10 MG/ML IJ SOLN
INTRAMUSCULAR | Status: DC | PRN
  Administered 2011-10-21 (×2): 40 ug via INTRAVENOUS

## 2011-10-21 MED ORDER — 0.9 % SODIUM CHLORIDE (POUR BTL) OPTIME
TOPICAL | Status: DC | PRN
  Administered 2011-10-21: 1000 mL

## 2011-10-21 MED ORDER — ESMOLOL HCL 10 MG/ML IV SOLN
INTRAVENOUS | Status: DC | PRN
  Administered 2011-10-21 (×2): 10 mg via INTRAVENOUS

## 2011-10-21 MED ORDER — FENTANYL CITRATE 0.05 MG/ML IJ SOLN: 50.0000 ug | INTRAMUSCULAR | Status: DC | PRN

## 2011-10-21 MED ORDER — ASPIRIN 81 MG PO TBEC: 81.0000 mg | DELAYED_RELEASE_TABLET | Freq: Every day | ORAL | Status: DC

## 2011-10-21 MED ORDER — MIDAZOLAM HCL 2 MG/2ML IJ SOLN: 1.0000 mg | INTRAMUSCULAR | Status: DC | PRN

## 2011-10-21 MED ORDER — PANTOPRAZOLE SODIUM 40 MG PO TBEC
80.0000 mg | DELAYED_RELEASE_TABLET | Freq: Every day | ORAL | Status: DC
  Administered 2011-10-22: 80 mg via ORAL
  Filled 2011-10-21: qty 2

## 2011-10-21 MED ORDER — HYDROMORPHONE HCL PF 1 MG/ML IJ SOLN
0.2500 mg | INTRAMUSCULAR | Status: DC | PRN
  Administered 2011-10-21 (×4): 0.5 mg via INTRAVENOUS

## 2011-10-21 MED ORDER — PROPOFOL 10 MG/ML IV EMUL
INTRAVENOUS | Status: DC | PRN
  Administered 2011-10-21: 50 ug/kg/min via INTRAVENOUS

## 2011-10-21 MED ORDER — LACTATED RINGERS IV SOLN
INTRAVENOUS | Status: DC | PRN
  Administered 2011-10-21 (×2): via INTRAVENOUS

## 2011-10-21 MED ORDER — LIDOCAINE HCL (CARDIAC) 20 MG/ML IV SOLN
INTRAVENOUS | Status: DC | PRN
  Administered 2011-10-21: 50 mg via INTRAVENOUS

## 2011-10-21 MED ORDER — LIDOCAINE-EPINEPHRINE 1 %-1:100000 IJ SOLN
INTRAMUSCULAR | Status: DC | PRN
  Administered 2011-10-21: 4 mL

## 2011-10-21 MED ORDER — ASPIRIN EC 81 MG PO TBEC
81.0000 mg | DELAYED_RELEASE_TABLET | Freq: Every day | ORAL | Status: DC
  Administered 2011-10-21 – 2011-10-22 (×2): 81 mg via ORAL
  Filled 2011-10-21 (×2): qty 1

## 2011-10-21 MED ORDER — FENTANYL CITRATE 0.05 MG/ML IJ SOLN
INTRAMUSCULAR | Status: DC | PRN
  Administered 2011-10-21: 100 ug via INTRAVENOUS
  Administered 2011-10-21 (×2): 50 ug via INTRAVENOUS
  Administered 2011-10-21: 100 ug via INTRAVENOUS

## 2011-10-21 SURGICAL SUPPLY — 48 items
ATTRACTOMAT 16X20 MAGNETIC DRP (DRAPES) IMPLANT
BENZOIN TINCTURE PRP APPL 2/3 (GAUZE/BANDAGES/DRESSINGS) ×2 IMPLANT
BLADE SURG ROTATE 9660 (MISCELLANEOUS) IMPLANT
CANISTER SUCTION 2500CC (MISCELLANEOUS) ×2 IMPLANT
CLEANER TIP ELECTROSURG 2X2 (MISCELLANEOUS) ×2 IMPLANT
CLOTH BEACON ORANGE TIMEOUT ST (SAFETY) ×2 IMPLANT
CONT SPEC 4OZ CLIKSEAL STRL BL (MISCELLANEOUS) ×2 IMPLANT
CORDS BIPOLAR (ELECTRODE) ×2 IMPLANT
COVER SURGICAL LIGHT HANDLE (MISCELLANEOUS) ×2 IMPLANT
CRADLE DONUT ADULT HEAD (MISCELLANEOUS) IMPLANT
DRAIN JACKSON RD 7FR 3/32 (WOUND CARE) ×2 IMPLANT
DRAPE INCISE 13X13 STRL (DRAPES) ×2 IMPLANT
ELECT COATED BLADE 2.86 ST (ELECTRODE) ×2 IMPLANT
ELECT PAIRED SUBDERMAL (MISCELLANEOUS) ×2
ELECT REM PT RETURN 9FT ADLT (ELECTROSURGICAL) ×2
ELECTRODE PAIRED SUBDERMAL (MISCELLANEOUS) ×1 IMPLANT
ELECTRODE REM PT RTRN 9FT ADLT (ELECTROSURGICAL) ×1 IMPLANT
EVACUATOR SILICONE 100CC (DRAIN) ×2 IMPLANT
GAUZE SPONGE 4X4 16PLY XRAY LF (GAUZE/BANDAGES/DRESSINGS) IMPLANT
GLOVE BIO SURGEON STRL SZ7.5 (GLOVE) ×4 IMPLANT
GLOVE BIOGEL PI IND STRL 7.5 (GLOVE) ×1 IMPLANT
GLOVE BIOGEL PI INDICATOR 7.5 (GLOVE) ×1
GLOVE SURG SS PI 7.0 STRL IVOR (GLOVE) ×2 IMPLANT
GOWN STRL NON-REIN LRG LVL3 (GOWN DISPOSABLE) ×4 IMPLANT
KIT BASIN OR (CUSTOM PROCEDURE TRAY) ×2 IMPLANT
KIT ROOM TURNOVER OR (KITS) ×2 IMPLANT
MARKER SKIN DUAL TIP RULER LAB (MISCELLANEOUS) ×2 IMPLANT
NS IRRIG 1000ML POUR BTL (IV SOLUTION) ×4 IMPLANT
PAD ARMBOARD 7.5X6 YLW CONV (MISCELLANEOUS) ×4 IMPLANT
PENCIL BUTTON HOLSTER BLD 10FT (ELECTRODE) ×2 IMPLANT
PROBE NERVBE PRASS .33 (MISCELLANEOUS) IMPLANT
SPECIMEN JAR MEDIUM (MISCELLANEOUS) ×2 IMPLANT
STAPLER VISISTAT 35W (STAPLE) ×2 IMPLANT
SUT ETHILON 2 0 FS 18 (SUTURE) ×4 IMPLANT
SUT ETHILON 5 0 P 3 18 (SUTURE) ×1
SUT NYLON ETHILON 5-0 P-3 1X18 (SUTURE) ×1 IMPLANT
SUT SILK 2 0 FS (SUTURE) ×2 IMPLANT
SUT SILK 3 0 REEL (SUTURE) ×2 IMPLANT
SUT SILK 4 0 REEL (SUTURE) ×2 IMPLANT
SUT VIC AB 3-0 SH 27 (SUTURE) ×1
SUT VIC AB 3-0 SH 27X BRD (SUTURE) ×1 IMPLANT
TOWEL OR 17X24 6PK STRL BLUE (TOWEL DISPOSABLE) ×2 IMPLANT
TOWEL OR 17X26 10 PK STRL BLUE (TOWEL DISPOSABLE) ×2 IMPLANT
TOWEL OR NON WOVEN STRL DISP B (DISPOSABLE) ×4 IMPLANT
TRAY ENT MC OR (CUSTOM PROCEDURE TRAY) ×2 IMPLANT
TRAY FOLEY CATH 14FRSI W/METER (CATHETERS) IMPLANT
UNDERPAD 30X30 INCONTINENT (UNDERPADS AND DIAPERS) IMPLANT
WATER STERILE IRR 1000ML POUR (IV SOLUTION) ×4 IMPLANT

## 2011-10-21 NOTE — Preoperative (Signed)
Beta Blockers   Reason not to administer Beta Blockers:Not Applicable 

## 2011-10-21 NOTE — Anesthesia Procedure Notes (Signed)
Procedure Name: Intubation Date/Time: 10/21/2011 7:57 AM Performed by: Elon Alas Pre-anesthesia Checklist: Patient identified, Timeout performed, Emergency Drugs available, Suction available and Patient being monitored Patient Re-evaluated:Patient Re-evaluated prior to inductionOxygen Delivery Method: Circle system utilized Preoxygenation: Pre-oxygenation with 100% oxygen Intubation Type: IV induction Ventilation: Mask ventilation without difficulty Grade View: Grade II Tube type: Oral Number of attempts: 1 Airway Equipment and Method: Stylet and Video-laryngoscopy Placement Confirmation: positive ETCO2,  ETT inserted through vocal cords under direct vision and breath sounds checked- equal and bilateral Secured at: 21 cm Tube secured with: Tape Dental Injury: Teeth and Oropharynx as per pre-operative assessment

## 2011-10-21 NOTE — Brief Op Note (Signed)
10/21/2011  9:31 AM  PATIENT:  Rachel [name]  46 y.o. 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]  POST-OPERATIVE DIAGNOSIS:  Parotid Neoplasm  PROCEDURE:  Procedure(s) (LRB): LATERAL PAROTIDECTOMY WITH NERVE DISSECTION (Left)  SURGEON:  Surgeon(s) and Role:    * Christia Reading, MD - Primary  PHYSICIAN ASSISTANT:   ASSISTANTS: Aquilla Hacker, PA   ANESTHESIA:   general  EBL:  Total I/O In: 1300 [I.V.:1300] Out: 25 [Blood:25]  BLOOD ADMINISTERED:none  DRAINS: (7 Fr) Jackson-Pratt drain(s) with closed bulb suction in the left parotid   LOCAL MEDICATIONS USED:  LIDOCAINE   SPECIMEN:  Source of Specimen:  Left lateral parotid gland  DISPOSITION OF SPECIMEN:  PATHOLOGY  COUNTS:  YES  TOURNIQUET:  * No tourniquets in log *  DICTATION: .Other Dictation: Dictation Number [id number]  PLAN OF CARE: Admit for overnight observation  PATIENT DISPOSITION:  PACU - hemodynamically stable.   Delay start of Pharmacological VTE agent (>24hrs) due to surgical blood loss or risk of bleeding: no

## 2011-10-21 NOTE — OR Nursing (Signed)
Contact lens removed per patient, placed in two separate, labeled (right and left)containers and taken to waiting room. Given to patient's husband per pt. Request.

## 2011-10-21 NOTE — Anesthesia Postprocedure Evaluation (Signed)
  Anesthesia Post-op Note  Patient: Rachel [name]  Procedure(s) Performed: Procedure(s) (LRB): PAROTIDECTOMY (Left)  Patient Location: PACU  Anesthesia Type: General  Level of Consciousness: awake  Airway and Oxygen Therapy: Patient Spontanous Breathing  Post-op Pain: mild  Post-op Assessment: Post-op Vital signs reviewed, Patient's Cardiovascular Status Stable, Respiratory Function Stable, Patent Airway, No signs of Nausea or vomiting and Pain level controlled  Post-op Vital Signs: stable  Complications: No apparent anesthesia complications

## 2011-10-21 NOTE — Op Note (Signed)
Rachel [name], Rachel [name] NO.:  [id number]  MEDICAL RECORD NO.:  [id number]  LOCATION:  5158                         FACILITY:  [facility]  PHYSICIAN:  Antony Contras, MD     DATE OF BIRTH:  [birth year]  DATE OF PROCEDURE:  10/21/2011 DATE OF DISCHARGE:                              OPERATIVE REPORT   PREOPERATIVE DIAGNOSIS:  Left parotid neoplasm.  POSTOPERATIVE DIAGNOSIS:  Left parotid neoplasm.  PROCEDURE:  Left lateral parotidectomy with nerve dissection.  SURGEON:  Antony Contras, MD.  ANESTHESIA:  General endotracheal anesthesia.  ASSISTANT:  Aquilla Hacker, [facility]  COMPLICATIONS:  None.  INDICATION:  The patient is a 46 year old female, who had a left parotid neoplasm identified on an MRI incidentally.  After discussing options, she has elected to proceed with excisional biopsy.  FINDINGS:  There was a well-circumscribed firm mass in the lateral parotid gland superiorly.  This was sent for pathology.  DESCRIPTION OF PROCEDURE:  The patient was identified in the holding room and informed consent having been obtained including discussion of risks, benefits, alternatives, the patient was brought to the operative suite and put on the table in supine position.  Anesthesia was induced, and the patient was intubated by anesthesia team without difficulty. This was performed with a GlideScope.  The patient was given intravenous antibiotics during the case and the eyes taped closed.  The nerve integrity monitor was attached to the left face in a standard fashion and turned on during the procedure.  The preauricular incision was marked with a marking pen and injected with 1% lidocaine with 1:100,000 epinephrine.  The left neck was then prepped and draped in sterile fashion including the left face.  The incision was then made using a 15 blade scalpel and extended through subcutaneous layer using Bovie electrocautery.  A preparotid flap was then elevated using  Bovie electrocautery.  The flap and the earlobe were then sutured back for retraction.  Dissection was then performed along the tragal cartilage down between the parotid gland and the cartilage as well as elevating the gland away from the underlying sternocleidomastoid muscle inferiorly.  Further dissection was then performed down to the stylomastoid foramen region, and the main trunk of the nerve was identified.  It was then traced in an antegrade fashion to the division of the nerve.  The superior division was then traced toward the mass dividing the gland along the way using both bipolar electrocautery and scissors.  The inferior division was then further traced once again dividing the gland.  The gland was then elevated in a superior direction up to the superior division region under the tumor.  The superior division was then further traced superiorly dividing the gland and keeping the nerve safe.  Further dissection was then performed under the tumor and also anterior to the tumor keeping nerve branches intact.  The tumor was removed in this fashion.  The tumor specimen was passed to nursing for pathology.  Bleeding was controlled with bipolar electrocautery.  The wound was copiously irrigated with saline.  The nerve stimulator 0.5 milliamps was then used to stimulate each of the branches, and there was stimulation.  At this point, a 7-French drain was placed in the wound and secured to the inferior extent of the incision using a 2-0 nylon suture in a standard running stitch.  The subcutaneous layer was then closed with 3-0 Vicryl suture in a simple running fashion.  The skin was closed with 5-0 nylon in a simple running fashion.  Bacitracin ointment was added to the incision.  The drain was hooked to suction during closure and was then attached to the left shoulder.  The patient returned to anesthesia for wake-up and was extubated, moved to recovery room in stable  condition.     Antony Contras, MD     DDB/MEDQ  D:  10/21/2011  T:  10/21/2011  Job:  [id number]

## 2011-10-21 NOTE — Telephone Encounter (Signed)
I did not see pt today  Please ask pharmacy to refer question to ordering MD - Dr Christia Reading

## 2011-10-21 NOTE — Transfer of Care (Signed)
Immediate Anesthesia Transfer of Care Note  Patient: Rachel [name]  Procedure(s) Performed: Procedure(s) (LRB): PAROTIDECTOMY (Left)  Patient Location: PACU  Anesthesia Type: General  Level of Consciousness: awake, alert  and oriented  Airway & Oxygen Therapy: Patient Spontanous Breathing and Patient connected to nasal cannula oxygen  Post-op Assessment: Report given to PACU RN, Post -op Vital signs reviewed and stable and Patient moving all extremities X 4  Post vital signs: Reviewed and stable  Complications: No apparent anesthesia complications

## 2011-10-21 NOTE — Progress Notes (Signed)
Subjective: Doing fairly well except for some dizziness and nausea.  Complains of incisional pain.  Objective: AFVSS Left parotid incision clean and intact.  No fluid collection.  Drain functioning.  Left CN VII function normal.  Assessment:  S/P left parotidectomy.  Plan:  Doing well.  Supportive care and drain management.  Likely drain removal and discharge tomorrow.

## 2011-10-21 NOTE — Progress Notes (Signed)
MEDICATION RELATED CONSULT NOTE - INITIAL   Patient has orders for Amaryl (glimepiride) 4mg  bid AND Glynase (glyburide) 3.75mg  bid. Both medications are sulfonylureas for DM.  Per [facility] outpatient pharmacy, the last time she had  glyburide/merformin 2.5/500 combo filled was Feb 2012.  She last picked up metformin 500mg  tab (now not taking due to GI Side effects), glimepiride 4mg  tab (4mg  BID), and januvia 100mg  daily on Oct 2012. I called Dr. Fayrene Fearing John's office to clarify, they will call back to clarify what DM meds she's taking. For now will only activate Amaryl and d/c glimepiride until futher clarification from primary care Dr. Raphael Gibney office.   Makael Stein S. Merilynn Finland, PharmD, BCPS Clinical Staff Pharmacist Pager [phone]

## 2011-10-21 NOTE — Anesthesia Preprocedure Evaluation (Addendum)
Anesthesia Evaluation  Patient identified by MRN, date of birth, ID band Patient awake    Reviewed: Allergy & Precautions, H&P , NPO status , Patient's Chart, lab work & pertinent test results  Airway Mallampati: I TM Distance: >3 FB Neck ROM: Full    Dental  (+) Dental Advisory Given and Teeth Intact   Pulmonary asthma ,    Pulmonary exam normal       Cardiovascular     Neuro/Psych  Headaches, Depression    GI/Hepatic GERD-  ,  Endo/Other  Diabetes mellitus-  Renal/GU      Musculoskeletal  (+) Fibromyalgia -  Abdominal   Peds  Hematology  (+) REFUSES BLOOD PRODUCTS,   Anesthesia Other Findings   Reproductive/Obstetrics                         Anesthesia Physical Anesthesia Plan  ASA: III  Anesthesia Plan: General   Post-op Pain Management:    Induction: Intravenous  Airway Management Planned: Oral ETT  Additional Equipment:   Intra-op Plan:   Post-operative Plan: Extubation in OR  Informed Consent: I have reviewed the patients History and Physical, chart, labs and discussed the procedure including the risks, benefits and alternatives for the proposed anesthesia with the patient or authorized representative who has indicated his/her understanding and acceptance.     Plan Discussed with: CRNA and Surgeon  Anesthesia Plan Comments:         Anesthesia Quick Evaluation

## 2011-10-21 NOTE — H&P (Signed)
Rachel [name] is an 46 y.o. female.   Chief Complaint: left parotid mass HPI: 46 year old female underwent an MRI that incidentally identified a mass in the left parotid gland.  Mass is palpable.  After discussing options, she wishes to proceed with excision.  Past Medical History  Diagnosis Date  . DIABETES MELLITUS, TYPE II 08/02/2007  . HYPERLIPIDEMIA 08/02/2007  . COMMON MIGRAINE 05/15/2009  . ALLERGIC RHINITIS 10/04/2007  . GERD 08/02/2007  . INTERMITTENT VERTIGO 05/15/2009  . INSOMNIA-SLEEP DISORDER-UNSPEC 01/04/2008  . LIBIDO, DECREASED 01/09/2010  . Anemia 01/21/2011  . ASTHMA 08/02/2007    INHALERS ONLY IN [place]    Past Surgical History  Procedure Date  . Cesarean section   . S/p left knee surgury     Family History  Problem Relation Age of Onset  . Hypertension Mother   . Hypertension Father   . Diabetes Father   . Asthma Father   . Cancer Other     cervical cancer  . Anesthesia problems Neg Hx    Social History:  reports that she has never smoked. She has never used smokeless tobacco. She reports that she does not drink alcohol or use illicit drugs.  Allergies:  Allergies  Allergen Reactions  . Atorvastatin     REACTION: myalgias  . Nitroglycerin     Medications Prior to Admission  Medication Sig Dispense Refill  . aspirin 81 MG EC tablet Take 81 mg by mouth daily.        Marland Kitchen esomeprazole (NEXIUM) 40 MG capsule Take 40 mg by mouth daily before breakfast.      . glimepiride (AMARYL) 4 MG tablet Take 4 mg by mouth 2 (two) times daily.        Marland Kitchen glyBURIDE micronized (GLYNASE) 4.5 MG tablet Take 4 mg by mouth 2 (two) times daily before a meal.        . metFORMIN (GLUCOPHAGE) 500 MG tablet Take 2 tablets (1,000 mg total) by mouth 2 (two) times daily with a meal.  360 tablet  3  . simvastatin (ZOCOR) 40 MG tablet Take 40 mg by mouth every evening.      . sitaGLIPtan (JANUVIA) 100 MG tablet Take 100 mg by mouth daily.        . Blood Glucose Monitoring Suppl (TRUERESULT  BLOOD GLUCOSE) W/DEVICE KIT Use as directed       . glucose blood (ONE TOUCH ULTRA TEST) test strip 2 (two) times daily. Use as instructed       . glucose blood (TRUETEST TEST) test strip Use as directed         No results found for this or any previous visit (from the past 48 hour(s)). No results found.  Review of Systems  All other systems reviewed and are negative.    Blood pressure 116/82, pulse 88, temperature 98 F (36.7 C), temperature source Oral, resp. rate 20, SpO2 95.00%. Physical Exam  Constitutional: She is oriented to person, place, and time. She appears well-developed and well-nourished.  HENT:  Head: Normocephalic and atraumatic.  Right Ear: External ear normal.  Left Ear: External ear normal.  Nose: Nose normal.  Mouth/Throat: Oropharynx is clear and moist.  Eyes: Conjunctivae and EOM are normal. Pupils are equal, round, and reactive to light.  Neck: Normal range of motion. Neck supple.       2 cm round, firm, mobile mass anterior to the left earlobe.  Cardiovascular: Normal rate.   Respiratory: Effort normal.  GI:  Did not examine.  Genitourinary:       Did not examine.  Musculoskeletal: Normal range of motion.  Neurological: She is alert and oriented to person, place, and time. No cranial nerve deficit.  Skin: Skin is warm and dry.  Psychiatric: She has a normal mood and affect. Her behavior is normal. Judgment and thought content normal.     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  Plan observation overnight with drain in place.  Ulices Maack 10/21/2011, 7:37 AM

## 2011-10-21 NOTE — Telephone Encounter (Signed)
[facility] Pharmacist called and  Stated the patient has some confusion on what diabetes meds. To take.  The pharmacist stated the patient does not take the metformin as causes GI problems.  She has Glyburide 4 mg BID and Glimepiride 4 mg BID.  Please advise on what she is to be taking?

## 2011-10-22 MED ORDER — HYDROCODONE-ACETAMINOPHEN 5-325 MG PO TABS: 1.0000 | ORAL_TABLET | ORAL | Status: AC | PRN

## 2011-10-22 NOTE — Discharge Summary (Signed)
Physician Discharge Summary  Patient ID: Rachel [name] MRN: [id number] DOB/AGE: Apr 23, 1966 46 y.o.  Admit date: 10/21/2011 Discharge date: 10/22/2011  Admission Diagnoses:  Left parotid neoplasm  Discharge Diagnoses: Same Active Problems:  * No active hospital problems. *    Discharged Condition: good  Hospital Course: 46 year old female underwent left parotidectomy yesterday.  Observed overnight with drain in place.  Did well except for some nausea and itching.  Feels good this morning.  Good po intake.  Drain output fairly light.  Drain removed.  Felt stable for discharge.  Consults: None  Significant Diagnostic Studies: None.  Treatments: surgery: Left parotidectomy  Discharge Exam: Blood pressure 94/64, pulse 93, temperature 98.1 F (36.7 C), temperature source Oral, resp. rate 20, height 5\' 1"  (1.549 m), weight 68 kg (149 lb 14.6 oz), SpO2 100.00%. General appearance: alert, cooperative and no distress Neck: Left preauricular incision clean and intact.  No fluid collection.  Drain removed.  Left facial movement normal.  Disposition: 01-Home or Self Care  Discharge Orders    Future Appointments: Provider: Department: Dept Phone: Center:   11/04/2011 9:30 AM Milas Gain, MD [facility] [phone] None     Future Orders Please Complete By Expires   Diet - low sodium heart healthy      Increase activity slowly      Discharge instructions      Comments:   Apply antibiotic ointment to incision twice daily.  OK to let incision get wet starting tomorrow.  Keep head elevated.  Avoid strenuous activity.  Keep diet bland.     Medication List  As of 10/22/2011 10:03 AM   TAKE these medications         aspirin 81 MG EC tablet   Take 81 mg by mouth daily.      esomeprazole 40 MG capsule   Commonly known as: NEXIUM   Take 40 mg by mouth daily before breakfast.      glimepiride 4 MG tablet   Commonly known as: AMARYL   Take 4 mg by mouth 2 (two) times daily.     glyBURIDE micronized 4.5 MG tablet   Commonly known as: GLYNASE   Take 4 mg by mouth 2 (two) times daily before a meal.      HYDROcodone-acetaminophen 5-325 MG per tablet   Commonly known as: NORCO   Take 1-2 tablets by mouth every 4 (four) hours as needed for pain.      metFORMIN 500 MG tablet   Commonly known as: GLUCOPHAGE   Take 2 tablets (1,000 mg total) by mouth 2 (two) times daily with a meal.      ONE TOUCH ULTRA TEST test strip   Generic drug: glucose blood   2 (two) times daily. Use as instructed      TRUETEST TEST test strip   Generic drug: glucose blood   Use as directed      simvastatin 40 MG tablet   Commonly known as: ZOCOR   Take 40 mg by mouth every evening.      sitaGLIPtin 100 MG tablet   Commonly known as: JANUVIA   Take 100 mg by mouth daily.      TRUEresult Blood Glucose W/DEVICE Kit   Use as directed           Follow-up Information    Follow up with Owen Pagnotta, MD. Schedule an appointment as soon as possible for a visit in 1 week.   Contact information:   [facility] [address] [place] [address] [phone]          Signed: Christia Reading 10/22/2011, 10:03 AM

## 2011-10-22 NOTE — Telephone Encounter (Signed)
Spoke with Morrie Sheldon at [facility] to refer questions to Dr Jenne Pane. sue

## 2011-10-22 NOTE — Progress Notes (Signed)
Discharge instructions/Med Rec Sheet reviewed w/ pt. Pt expressed understanding and copies given w/ prescriptions. Pt d/c'd in stable condition via w/c, accompanied by discharge volunteers 

## 2011-10-25 ENCOUNTER — Encounter (HOSPITAL_COMMUNITY): Payer: Self-pay | Admitting: Otolaryngology

## 2011-11-04 ENCOUNTER — Ambulatory Visit (INDEPENDENT_AMBULATORY_CARE_PROVIDER_SITE_OTHER): Payer: 59 | Admitting: Neurology

## 2011-11-04 ENCOUNTER — Encounter: Payer: Self-pay | Admitting: Neurology

## 2011-11-04 VITALS — BP 106/76 | HR 76 | Wt 150.0 lb

## 2011-11-04 DIAGNOSIS — R42 Dizziness and giddiness: Secondary | ICD-10-CM

## 2011-11-04 NOTE — Progress Notes (Signed)
Dear Dr. Jonny Ruiz,  I saw  Rachel [name] back in [facility] for her problem with sudden falls as well as dizziness.  As you may recall, she is a 46 y.o. year old female with a history of diabetes who has had a long history of sudden falls without loss of consciousness.  At her last visit I ordered both a routine and then sleep deprived EEG which were normal.  An MRI of her brian was unremarkable as to a cause of her falls, but she was noted to have a parotid lesion on her left parotid gland.  She had this lesion recently removed.  I could not find the report to see whether it was benign or not.    She has also had a long history of what sounds like vertigo.  At her last visit I was wondering whether this was related to her spells of falling, but she rarely connected the to together.  The vertigo is described as positional, but does not seem to happen lying down.  It lasts less than a minute.  Since her parotidectomy it has gotten worse.  No changes in her hearing.  She has been given valium but it has not helped her.  Medical history, social history, and family history were reviewed and have not changed since the last clinic visit.  Current Outpatient Prescriptions on File Prior to Visit  Medication Sig Dispense Refill  . aspirin 81 MG EC tablet Take 81 mg by mouth daily.        . Blood Glucose Monitoring Suppl (TRUERESULT BLOOD GLUCOSE) W/DEVICE KIT Use as directed       . esomeprazole (NEXIUM) 40 MG capsule Take 40 mg by mouth daily before breakfast.      . glucose blood (ONE TOUCH ULTRA TEST) test strip 2 (two) times daily. Use as instructed       . glucose blood (TRUETEST TEST) test strip Use as directed       . glyBURIDE micronized (GLYNASE) 4.5 MG tablet Take 4 mg by mouth 2 (two) times daily before a meal.        . simvastatin (ZOCOR) 40 MG tablet Take 40 mg by mouth every evening.      . sitaGLIPtan (JANUVIA) 100 MG tablet Take 100 mg by mouth daily.        Marland Kitchen glimepiride (AMARYL) 4  MG tablet Take 4 mg by mouth 2 (two) times daily.        . metFORMIN (GLUCOPHAGE) 500 MG tablet Take 2 tablets (1,000 mg total) by mouth 2 (two) times daily with a meal.  360 tablet  3    Allergies  Allergen Reactions  . Atorvastatin     REACTION: myalgias  . Nitroglycerin     ROS:  13 systems were reviewed and  are unremarkable.  Exam: . Filed Vitals:   11/04/11 0915  BP: 106/76  Pulse: 76  Weight: 150 lb (68.04 kg)    In general, well appearing women.  H&N:  healing scar, left posterior aspect of jaw.  TM's appear normal.  Vest:  could not induce nystagmus with D-H, although made her vertiginous bilaterally.  - head thrust, - Fukuda  Coordination:  Normal finger to nose  Gait:  Normal gait and station.  Romberg negative.  Impression/Recommendations:  1.  Spells of falling - I am more convinced that these may be related to her vertigo and not seizures or syncope.  I am going to try to treat  her vertigo with vestibular rehab to see if her spells of falling improve. 2.  Vertigo - sounds like BPPV, but I would appreciate the opinion and treatment of my colleagues in vestibular rehab.  We will see the patient back in 3 months.  Lupita Raider Modesto Charon, MD [facility], [place]

## 2011-11-04 NOTE — Patient Instructions (Signed)
Physical therapy will call you directly to schedule your appointments.

## 2011-11-12 ENCOUNTER — Encounter: Payer: Self-pay | Admitting: Internal Medicine

## 2011-11-12 ENCOUNTER — Ambulatory Visit (INDEPENDENT_AMBULATORY_CARE_PROVIDER_SITE_OTHER): Payer: 59 | Admitting: Internal Medicine

## 2011-11-12 ENCOUNTER — Other Ambulatory Visit: Payer: Self-pay

## 2011-11-12 VITALS — BP 100/72 | HR 91 | Temp 97.5°F | Ht 61.0 in | Wt 151.4 lb

## 2011-11-12 DIAGNOSIS — E119 Type 2 diabetes mellitus without complications: Secondary | ICD-10-CM

## 2011-11-12 DIAGNOSIS — E785 Hyperlipidemia, unspecified: Secondary | ICD-10-CM

## 2011-11-12 DIAGNOSIS — R42 Dizziness and giddiness: Secondary | ICD-10-CM

## 2011-11-12 LAB — GLUCOSE, POCT (MANUAL RESULT ENTRY): POC Glucose: 386 mg/dl — AB (ref 70–99)

## 2011-11-12 MED ORDER — METFORMIN HCL 500 MG PO TABS: 1000.0000 mg | ORAL_TABLET | Freq: Two times a day (BID) | ORAL | Status: DC

## 2011-11-12 MED ORDER — LANCETS MISC: 1.0000 "application " | Freq: Every day | Status: DC

## 2011-11-12 MED ORDER — GLUCOSE BLOOD VI STRP: ORAL_STRIP | Status: DC

## 2011-11-12 MED ORDER — INSULIN GLARGINE 100 UNIT/ML ~~LOC~~ SOLN: 15.0000 [IU] | Freq: Every day | SUBCUTANEOUS | Status: DC

## 2011-11-12 MED ORDER — INSULIN PEN NEEDLE 32G X 4 MM MISC: 1.0000 "application " | Freq: Every day | Status: DC

## 2011-11-12 NOTE — Patient Instructions (Addendum)
Your blood sugar today is 386 in the office OK to stop the [place] as you have You are given 10 units lantus today Please start the Lantus at 15 units per day (either in the AM or the PM, just so it is about the same time each day) You are given the prescription for the lantus and needles, and strips and lancets today Please check your sugars three times per day before each meal when starting the lantus, and call with your results please on Tues or Wed next wk (or call sooner if your sugars for some reason are too low such less than 100) If you sugar overall are more than 175 or so, we can safely increase the lantus at that time Continue all other medications as before, including the glipizide and metformin for now Please keep your appointments with your specialists as you have planned - Physical Therapy Take all new medications as prescribed  - the meclizine to try for vertigo Please keep your appointments with your specialists as you have planned  - Dr Modesto Charon and Dr Westley Hummer Please return in 3 mo with Lab testing done 3-5 days before

## 2011-11-12 NOTE — Progress Notes (Signed)
Subjective:    Patient ID: Rachel [name], female    DOB: [birth year], 46 y.o.   MRN: [id number]  HPI  Here now s/p ENT surgury per [facility], with f/u later today there as well.  Had some high sugars up to 300 on occasion about that time.  Pt denies chest pain, increased sob or doe, wheezing, orthopnea, PND, increased LE swelling, palpitations.  Pt denies new neurological symptoms such as new headache, or facial or extremity weakness or numbness   Pt denies polydipsia, polyuria, or low sugar symptoms such as weakness or confusion improved with po intake.  Pt states overall good compliance with meds, trying to follow lower cholesterol, diabetic diet, wt overall down several lbs around the surgury - now 151, down from 158 about jan 2013.  Stopped the [place] 2 wks ago after seeing something on TV about risk of cancer.   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  Has not yet started PTdue to the recent surgury.  Recent diazepam made her fatigued and sleepy, not really sure if helped with the dizziness.  CBG's in the 200's often for her, and I wonder about relative volume depletion due to this as well.  Did see optho recently with neg exam for BDR per pt Past Medical History  Diagnosis Date  . DIABETES MELLITUS, TYPE II 08/02/2007  . HYPERLIPIDEMIA 08/02/2007  . COMMON MIGRAINE 05/15/2009  . ALLERGIC RHINITIS 10/04/2007  . GERD 08/02/2007  . INTERMITTENT VERTIGO 05/15/2009  . INSOMNIA-SLEEP DISORDER-UNSPEC 01/04/2008  . LIBIDO, DECREASED 01/09/2010  . Anemia 01/21/2011  . ASTHMA 08/02/2007    INHALERS ONLY IN [place]   Past Surgical History  Procedure Date  . Cesarean section   . S/p left knee surgury   . Parotidectomy 10/21/2011  . Parotidectomy 10/21/2011    Procedure: PAROTIDECTOMY;  Surgeon: Christia Reading, MD;  Location: [facility] OR;  Service: ENT;  Laterality: Left;    reports that she has never smoked. She has never  used smokeless tobacco. She reports that she does not drink alcohol or use illicit drugs. family history includes Asthma in her father; Cancer in her other; Diabetes in her father; and Hypertension in her father and mother.  There is no history of Anesthesia problems. Allergies  Allergen Reactions  . Atorvastatin     REACTION: myalgias  . Nitroglycerin    Current Outpatient Prescriptions on File Prior to Visit  Medication Sig Dispense Refill  . aspirin 81 MG EC tablet Take 81 mg by mouth daily.        . Blood Glucose Monitoring Suppl (TRUERESULT BLOOD GLUCOSE) W/DEVICE KIT Use as directed       . esomeprazole (NEXIUM) 40 MG capsule Take 40 mg by mouth daily before breakfast.      . glyBURIDE micronized (GLYNASE) 4.5 MG tablet Take 4 mg by mouth 2 (two) times daily before a meal.        . simvastatin (ZOCOR) 40 MG tablet Take 40 mg by mouth every evening.      Marland Kitchen glimepiride (AMARYL) 4 MG tablet Take 4 mg by mouth 2 (two) times daily.        . insulin glargine (LANTUS SOLOSTAR) 100 UNIT/ML injection Inject 15 Units into the skin daily.  15 mL  12  . metFORMIN (GLUCOPHAGE) 500 MG tablet Take 2 tablets (1,000 mg total) by mouth 2 (two) times daily with a meal.  360 tablet  3   Review of Systems Review of Systems  Constitutional: Negative for diaphoresis and unexpected weight change.  HENT: Negative for  tinnitus.   Eyes: Negative for photophobia and visual disturbance.  Respiratory: Negative for choking and stridor.   Gastrointestinal: Negative for vomiting and blood in stool.  Genitourinary: Negative for hematuria and decreased urine volume.  Musculoskeletal: Negative for gait problem.  Skin: Negative for color change and wound.  Neurological: Negative for tremors and numbness.  Psychiatric/Behavioral: Negative for decreased concentration. The patient is not hyperactive.      Objective:   Physical Exam BP 100/72  Pulse 91  Temp(Src) 97.5 F (36.4 C) (Oral)  Ht 5\' 1"  (1.549 m)  Wt  151 lb 6 oz (68.663 kg)  BMI 28.60 kg/m2  SpO2 98% Physical Exam  VS noted Constitutional: Pt appears well-developed and well-nourished.  HENT: Head: Normocephalic.  Right Ear: External ear normal.  Left Ear: External ear normal.  Eyes: Conjunctivae and EOM are normal. Pupils are equal, round, and reactive to light.  Neck: Normal range of motion. Neck supple.  Cardiovascular: Normal rate and regular rhythm.   Pulmonary/Chest: Effort normal and breath sounds normal.  Neurological: Pt is alert. No cranial nerve deficit.  Skin: Skin is warm. No erythema.  Psychiatric: Pt behavior is normal. Thought content normal.     Assessment & Plan:

## 2011-11-13 ENCOUNTER — Encounter: Payer: Self-pay | Admitting: Internal Medicine

## 2011-11-13 NOTE — Assessment & Plan Note (Signed)
Uncontrolled, cbg in the office 386 today, stopped the [place] due to concern about side effect, given lantus 10 units today, to start lantus 15 units daily and call with cbg's in 5 days, likely will need to titrate up, Continue all other medications as before, rov 3 mo

## 2011-11-13 NOTE — Assessment & Plan Note (Addendum)
stable overall by hx and exam, most recent data reviewed with pt, and pt to continue medical treatment as before, goal ldl < 70, to re-assess when sugar better controlled Lab Results  Component Value Date   LDLCALC 104* 01/15/2011

## 2011-11-13 NOTE — Assessment & Plan Note (Signed)
For meclizine prn, to f/u any worsening symptoms or concerns, I think also an element of dizziness could be relative dehydration to chronically persistent elev BS?

## 2011-11-16 ENCOUNTER — Other Ambulatory Visit: Payer: Self-pay | Admitting: *Deleted

## 2011-11-16 DIAGNOSIS — E119 Type 2 diabetes mellitus without complications: Secondary | ICD-10-CM

## 2011-11-16 MED ORDER — METFORMIN HCL 500 MG PO TABS: 1000.0000 mg | ORAL_TABLET | Freq: Two times a day (BID) | ORAL | Status: DC

## 2011-11-16 MED ORDER — GLIMEPIRIDE 4 MG PO TABS: 4.0000 mg | ORAL_TABLET | Freq: Two times a day (BID) | ORAL | Status: DC

## 2012-01-25 ENCOUNTER — Other Ambulatory Visit (INDEPENDENT_AMBULATORY_CARE_PROVIDER_SITE_OTHER): Payer: 59

## 2012-01-25 DIAGNOSIS — E119 Type 2 diabetes mellitus without complications: Secondary | ICD-10-CM

## 2012-01-25 DIAGNOSIS — E785 Hyperlipidemia, unspecified: Secondary | ICD-10-CM

## 2012-01-25 LAB — BASIC METABOLIC PANEL
BUN: 9 mg/dL (ref 6–23)
CO2: 24 mEq/L (ref 19–32)
Calcium: 9.1 mg/dL (ref 8.4–10.5)
Chloride: 100 mEq/L (ref 96–112)
Creatinine, Ser: 0.6 mg/dL (ref 0.4–1.2)
GFR: 109.34 mL/min (ref >60.00)
Glucose, Bld: 154 mg/dL — ABNORMAL HIGH (ref 70–99)
Potassium: 3.9 mEq/L (ref 3.5–5.1)
Sodium: 133 mEq/L — ABNORMAL LOW (ref 135–145)

## 2012-01-25 LAB — LIPID PANEL
Cholesterol: 248 mg/dL — ABNORMAL HIGH (ref 0–200)
HDL: 49.7 mg/dL (ref >39.00)
Total CHOL/HDL Ratio: 5
Triglycerides: 168 mg/dL — ABNORMAL HIGH (ref 0.0–149.0)
VLDL: 33.6 mg/dL (ref 0.0–40.0)

## 2012-01-25 LAB — HEMOGLOBIN A1C: Hgb A1c MFr Bld: 9.9 % — ABNORMAL HIGH (ref 4.6–6.5)

## 2012-01-26 LAB — LDL CHOLESTEROL, DIRECT: Direct LDL: 171.8 mg/dL

## 2012-02-04 ENCOUNTER — Ambulatory Visit: Payer: 59 | Admitting: Neurology

## 2012-02-11 ENCOUNTER — Ambulatory Visit: Payer: 59 | Admitting: Internal Medicine

## 2012-02-20 NOTE — Progress Notes (Signed)
No show.  Follow up prn.  

## 2012-02-25 ENCOUNTER — Ambulatory Visit: Payer: 59 | Admitting: Internal Medicine

## 2012-03-06 ENCOUNTER — Ambulatory Visit (INDEPENDENT_AMBULATORY_CARE_PROVIDER_SITE_OTHER): Payer: 59 | Admitting: Internal Medicine

## 2012-03-06 ENCOUNTER — Encounter: Payer: Self-pay | Admitting: Internal Medicine

## 2012-03-06 ENCOUNTER — Other Ambulatory Visit: Payer: 59

## 2012-03-06 VITALS — BP 112/72 | HR 100 | Temp 99.4°F | Ht 61.0 in | Wt 151.0 lb

## 2012-03-06 DIAGNOSIS — M545 Low back pain, unspecified: Secondary | ICD-10-CM

## 2012-03-06 DIAGNOSIS — E785 Hyperlipidemia, unspecified: Secondary | ICD-10-CM

## 2012-03-06 DIAGNOSIS — Z0000 Encounter for general adult medical examination without abnormal findings: Secondary | ICD-10-CM

## 2012-03-06 DIAGNOSIS — N390 Urinary tract infection, site not specified: Secondary | ICD-10-CM

## 2012-03-06 DIAGNOSIS — M549 Dorsalgia, unspecified: Secondary | ICD-10-CM | POA: Insufficient documentation

## 2012-03-06 DIAGNOSIS — E119 Type 2 diabetes mellitus without complications: Secondary | ICD-10-CM

## 2012-03-06 LAB — POCT URINALYSIS DIPSTICK
Bilirubin, UA: NEGATIVE
Glucose, UA: 2000
Ketones, UA: NEGATIVE
Nitrite, UA: NEGATIVE
Protein, UA: NEGATIVE
Spec Grav, UA: 1.015
Urobilinogen, UA: NEGATIVE
pH, UA: 5

## 2012-03-06 MED ORDER — INSULIN GLARGINE 100 UNIT/ML ~~LOC~~ SOLN: 30.0000 [IU] | Freq: Every day | SUBCUTANEOUS | Status: DC

## 2012-03-06 MED ORDER — CEPHALEXIN 500 MG PO CAPS: 500.0000 mg | ORAL_CAPSULE | Freq: Four times a day (QID) | ORAL | Status: DC

## 2012-03-06 MED ORDER — SIMVASTATIN 40 MG PO TABS: 40.0000 mg | ORAL_TABLET | Freq: Every evening | ORAL | Status: DC

## 2012-03-06 NOTE — Patient Instructions (Addendum)
Take all new medications as prescribed Continue all other medications as before OK to increase the lantus to 30 units per day Please take all of your medications every day Please return in 3 mo with Lab testing done 3-5 days before

## 2012-03-06 NOTE — Assessment & Plan Note (Signed)
Mild to mod, for antibx course,  to f/u any worsening symptoms or concerns, for urine cx 

## 2012-03-06 NOTE — Assessment & Plan Note (Signed)
Uncontrolled, keeps forgetting med, encouraged compliacne, for refill, cont diet, f/u next visit

## 2012-03-06 NOTE — Progress Notes (Signed)
Subjective:    Patient ID: Rachel [name], female    DOB: 10/20/1965, 46 y.o.   MRN: [id number]  HPI  Here with 3-4 days onset dysuria, frequency, with different odor, but no urgency,or hematuria, flank pain, n/v, f/c.  Pt denies chest pain, increased sob or doe, wheezing, orthopnea, PND, increased LE swelling, palpitations, dizziness or syncope.  Pt denies new neurological symptoms such as new headache, or facial or extremity weakness or numbness   Pt denies polydipsia, polyuria, or low sugar symptoms such as weakness or confusion improved with po intake.  Pt states overall good compliance with meds, trying to follow lower cholesterol, diabetic diet, wt overall stable but little exercise however.  CBG's in 200's.  Pain does radiate somewhat to the back, but may have lower lumbar spasm as well. Past Medical History  Diagnosis Date  . DIABETES MELLITUS, TYPE II 08/02/2007  . HYPERLIPIDEMIA 08/02/2007  . COMMON MIGRAINE 05/15/2009  . ALLERGIC RHINITIS 10/04/2007  . GERD 08/02/2007  . INTERMITTENT VERTIGO 05/15/2009  . INSOMNIA-SLEEP DISORDER-UNSPEC 01/04/2008  . LIBIDO, DECREASED 01/09/2010  . Anemia 01/21/2011  . ASTHMA 08/02/2007    INHALERS ONLY IN [place]   Past Surgical History  Procedure Date  . Cesarean section   . S/p left knee surgury   . Parotidectomy 10/21/2011  . Parotidectomy 10/21/2011    Procedure: PAROTIDECTOMY;  Surgeon: Christia Reading, MD;  Location: [facility] OR;  Service: ENT;  Laterality: Left;    reports that she has never smoked. She has never used smokeless tobacco. She reports that she does not drink alcohol or use illicit drugs. family history includes Asthma in her father; Cancer in her other; Diabetes in her father; and Hypertension in her father and mother.  There is no history of Anesthesia problems. Allergies  Allergen Reactions  . Atorvastatin     REACTION: myalgias  . Nitroglycerin    Current Outpatient Prescriptions on File Prior to Visit  Medication Sig Dispense Refill  .  aspirin 81 MG EC tablet Take 81 mg by mouth daily.        . Blood Glucose Monitoring Suppl (TRUERESULT BLOOD GLUCOSE) W/DEVICE KIT Use as directed       . esomeprazole (NEXIUM) 40 MG capsule Take 40 mg by mouth daily before breakfast.      . glimepiride (AMARYL) 4 MG tablet Take 1 tablet (4 mg total) by mouth 2 (two) times daily.  180 tablet  1  . glucose blood (TRUETEST TEST) test strip Use as directed  100 each  12  . glyBURIDE micronized (GLYNASE) 4.5 MG tablet Take 4 mg by mouth 2 (two) times daily before a meal.        . Insulin Pen Needle (BD PEN NEEDLE NANO U/F) 32G X 4 MM MISC 1 application by Does not apply route daily.  100 each  12  . Lancets MISC 1 application by Does not apply route daily.  100 each  12  . metFORMIN (GLUCOPHAGE) 500 MG tablet Take 2 tablets (1,000 mg total) by mouth 2 (two) times daily with a meal.  360 tablet  1  . DISCONTD: insulin glargine (LANTUS SOLOSTAR) 100 UNIT/ML injection Inject 15 Units into the skin daily.  15 mL  12  . DISCONTD: simvastatin (ZOCOR) 40 MG tablet Take 40 mg by mouth every evening.       Review of Systems  Constitutional: Negative for diaphoresis and unexpected weight change.  HENT: Negative for tinnitus.   Eyes: Negative for photophobia and  visual disturbance.  Respiratory: Negative for choking and stridor.   Gastrointestinal: Negative for vomiting and blood in stool.  Genitourinary: Negative for hematuria and decreased urine volume.  Musculoskeletal: Negative for gait problem.  Skin: Negative for color change and wound.  Neurological: Negative for tremors and numbness.  Psychiatric/Behavioral: Negative for decreased concentration. The patient is not hyperactive.       Objective:   Physical Exam BP 112/72  Pulse 100  Temp 99.4 F (37.4 C) (Oral)  Ht 5\' 1"  (1.549 m)  Wt 151 lb (68.493 kg)  BMI 28.53 kg/m2  SpO2 98% Physical Exam  VS noted, mild ill Constitutional: Pt appears well-developed and well-nourished.  HENT: Head:  Normocephalic.  Right Ear: External ear normal.  Left Ear: External ear normal.  Eyes: Conjunctivae and EOM are normal. Pupils are equal, round, and reactive to light.  Neck: Normal range of motion. Neck supple.  Cardiovascular: Normal rate and regular rhythm.   Pulmonary/Chest: Effort normal and breath sounds normal.  No flank tender Abd:  Soft, NT, non-distended, + BS except for mild low mid abd tender, without guarding or rebound Neurological: Pt is alert. Not confused  Skin: Skin is warm. No erythema.  Psychiatric: Pt behavior is normal. Thought content normal.     Assessment & Plan:

## 2012-03-06 NOTE — Assessment & Plan Note (Signed)
Ok to increase the lantus to 30 units per day, Continue all other medications as before, cont diet, encourage med compliance Lab Results  Component Value Date   HGBA1C 9.9* 01/25/2012

## 2012-03-06 NOTE — Assessment & Plan Note (Signed)
C/w msk or referred from bladder i suspect, mild, tylenol prn,  to f/u any worsening symptoms or concerns j

## 2012-03-07 ENCOUNTER — Other Ambulatory Visit: Payer: Self-pay | Admitting: Internal Medicine

## 2012-03-07 DIAGNOSIS — N390 Urinary tract infection, site not specified: Secondary | ICD-10-CM

## 2012-03-08 LAB — URINE CULTURE: Colony Count: >=100000

## 2012-03-09 ENCOUNTER — Encounter: Payer: Self-pay | Admitting: Internal Medicine

## 2012-03-09 DIAGNOSIS — Z0279 Encounter for issue of other medical certificate: Secondary | ICD-10-CM

## 2012-03-16 ENCOUNTER — Other Ambulatory Visit (HOSPITAL_COMMUNITY)
Admission: RE | Admit: 2012-03-16 | Discharge: 2012-03-16 | Disposition: A | Discharge status: Home / Self Care | Payer: 59 | Source: Ambulatory Visit | Attending: Obstetrics and Gynecology | Admitting: Obstetrics and Gynecology

## 2012-03-16 ENCOUNTER — Other Ambulatory Visit: Payer: Self-pay | Admitting: Nurse Practitioner

## 2012-03-16 DIAGNOSIS — Z113 Encounter for screening for infections with a predominantly sexual mode of transmission: Secondary | ICD-10-CM | POA: Insufficient documentation

## 2012-03-16 DIAGNOSIS — Z01419 Encounter for gynecological examination (general) (routine) without abnormal findings: Secondary | ICD-10-CM | POA: Insufficient documentation

## 2012-03-16 DIAGNOSIS — N760 Acute vaginitis: Secondary | ICD-10-CM | POA: Insufficient documentation

## 2012-03-16 DIAGNOSIS — Z1151 Encounter for screening for human papillomavirus (HPV): Secondary | ICD-10-CM | POA: Insufficient documentation

## 2012-06-05 ENCOUNTER — Ambulatory Visit: Payer: 59 | Admitting: Internal Medicine

## 2012-06-13 ENCOUNTER — Other Ambulatory Visit (INDEPENDENT_AMBULATORY_CARE_PROVIDER_SITE_OTHER): Payer: 59

## 2012-06-13 DIAGNOSIS — Z0000 Encounter for general adult medical examination without abnormal findings: Secondary | ICD-10-CM

## 2012-06-13 DIAGNOSIS — E119 Type 2 diabetes mellitus without complications: Secondary | ICD-10-CM

## 2012-06-13 LAB — LIPID PANEL
Cholesterol: 241 mg/dL — ABNORMAL HIGH (ref 0–200)
HDL: 44.3 mg/dL (ref >39.00)
Total CHOL/HDL Ratio: 5
Triglycerides: 168 mg/dL — ABNORMAL HIGH (ref 0.0–149.0)
VLDL: 33.6 mg/dL (ref 0.0–40.0)

## 2012-06-13 LAB — BASIC METABOLIC PANEL
BUN: 9 mg/dL (ref 6–23)
CO2: 28 mEq/L (ref 19–32)
Calcium: 9.2 mg/dL (ref 8.4–10.5)
Chloride: 102 mEq/L (ref 96–112)
Creatinine, Ser: 0.6 mg/dL (ref 0.4–1.2)
GFR: 107.17 mL/min (ref >60.00)
Glucose, Bld: 139 mg/dL — ABNORMAL HIGH (ref 70–99)
Potassium: 4.2 mEq/L (ref 3.5–5.1)
Sodium: 137 mEq/L (ref 135–145)

## 2012-06-13 LAB — CBC WITH DIFFERENTIAL/PLATELET
Basophils Absolute: 0.1 10*3/uL (ref 0.0–0.1)
Basophils Relative: 0.7 % (ref 0.0–3.0)
Eosinophils Absolute: 0.1 10*3/uL (ref 0.0–0.7)
Eosinophils Relative: 1 % (ref 0.0–5.0)
HCT: 34.7 % — ABNORMAL LOW (ref 36.0–46.0)
Hemoglobin: 11.1 g/dL — ABNORMAL LOW (ref 12.0–15.0)
Lymphocytes Relative: 52.7 % — ABNORMAL HIGH (ref 12.0–46.0)
Lymphs Abs: 4.3 10*3/uL — ABNORMAL HIGH (ref 0.7–4.0)
MCHC: 32.1 g/dL (ref 30.0–36.0)
MCV: 77.1 fl — ABNORMAL LOW (ref 78.0–100.0)
Monocytes Absolute: 0.7 10*3/uL (ref 0.1–1.0)
Monocytes Relative: 8.1 % (ref 3.0–12.0)
Neutro Abs: 3.1 10*3/uL (ref 1.4–7.7)
Neutrophils Relative %: 37.5 % — ABNORMAL LOW (ref 43.0–77.0)
Platelets: 418 10*3/uL — ABNORMAL HIGH (ref 150.0–400.0)
RBC: 4.5 Mil/uL (ref 3.87–5.11)
RDW: 14.8 % — ABNORMAL HIGH (ref 11.5–14.6)
WBC: 8.2 10*3/uL (ref 4.5–10.5)

## 2012-06-13 LAB — URINALYSIS, ROUTINE W REFLEX MICROSCOPIC
Bilirubin Urine: NEGATIVE
Hgb urine dipstick: NEGATIVE
Ketones, ur: NEGATIVE
Nitrite: NEGATIVE
Specific Gravity, Urine: 1.01 (ref 1.000–1.030)
Total Protein, Urine: NEGATIVE
Urine Glucose: NEGATIVE
Urobilinogen, UA: 0.2 (ref 0.0–1.0)
pH: 8 (ref 5.0–8.0)

## 2012-06-13 LAB — LDL CHOLESTEROL, DIRECT: Direct LDL: 178.8 mg/dL

## 2012-06-13 LAB — TSH: TSH: 0.9 u[IU]/mL (ref 0.35–5.50)

## 2012-06-13 LAB — HEPATIC FUNCTION PANEL
ALT: 15 U/L (ref 0–35)
AST: 16 U/L (ref 0–37)
Albumin: 4 g/dL (ref 3.5–5.2)
Alkaline Phosphatase: 63 U/L (ref 39–117)
Bilirubin, Direct: 0 mg/dL (ref 0.0–0.3)
Total Bilirubin: 0.8 mg/dL (ref 0.3–1.2)
Total Protein: 7.6 g/dL (ref 6.0–8.3)

## 2012-06-13 LAB — HEMOGLOBIN A1C: Hgb A1c MFr Bld: 10.1 % — ABNORMAL HIGH (ref 4.6–6.5)

## 2012-06-13 LAB — MICROALBUMIN / CREATININE URINE RATIO
Creatinine,U: 74.3 mg/dL
Microalb Creat Ratio: 0.3 mg/g (ref 0.0–30.0)
Microalb, Ur: 0.2 mg/dL (ref 0.0–1.9)

## 2012-06-16 ENCOUNTER — Encounter: Payer: Self-pay | Admitting: Internal Medicine

## 2012-06-16 ENCOUNTER — Telehealth: Payer: Self-pay | Admitting: Internal Medicine

## 2012-06-16 ENCOUNTER — Ambulatory Visit (INDEPENDENT_AMBULATORY_CARE_PROVIDER_SITE_OTHER): Payer: 59 | Admitting: Internal Medicine

## 2012-06-16 ENCOUNTER — Ambulatory Visit (INDEPENDENT_AMBULATORY_CARE_PROVIDER_SITE_OTHER)
Admission: RE | Admit: 2012-06-16 | Discharge: 2012-06-16 | Disposition: A | Discharge status: Home / Self Care | Payer: 59 | Source: Ambulatory Visit | Attending: Internal Medicine | Admitting: Internal Medicine

## 2012-06-16 VITALS — BP 110/80 | HR 79 | Temp 98.4°F | Ht 61.0 in | Wt 160.1 lb

## 2012-06-16 DIAGNOSIS — Z0000 Encounter for general adult medical examination without abnormal findings: Secondary | ICD-10-CM

## 2012-06-16 DIAGNOSIS — R079 Chest pain, unspecified: Secondary | ICD-10-CM

## 2012-06-16 DIAGNOSIS — E785 Hyperlipidemia, unspecified: Secondary | ICD-10-CM

## 2012-06-16 DIAGNOSIS — E119 Type 2 diabetes mellitus without complications: Secondary | ICD-10-CM

## 2012-06-16 DIAGNOSIS — D509 Iron deficiency anemia, unspecified: Secondary | ICD-10-CM

## 2012-06-16 DIAGNOSIS — R296 Repeated falls: Secondary | ICD-10-CM

## 2012-06-16 DIAGNOSIS — Z9181 History of falling: Secondary | ICD-10-CM

## 2012-06-16 MED ORDER — GLIPIZIDE ER 5 MG PO TB24: 5.0000 mg | ORAL_TABLET | Freq: Every day | ORAL | Status: DC

## 2012-06-16 MED ORDER — PANTOPRAZOLE SODIUM 40 MG PO TBEC: 40.0000 mg | DELAYED_RELEASE_TABLET | Freq: Every day | ORAL | Status: DC

## 2012-06-16 MED ORDER — METFORMIN HCL 500 MG PO TABS: 1000.0000 mg | ORAL_TABLET | Freq: Two times a day (BID) | ORAL | Status: DC

## 2012-06-16 MED ORDER — GLUCOSE BLOOD VI STRP: ORAL_STRIP | Status: DC

## 2012-06-16 MED ORDER — LANCETS MISC: 1.0000 "application " | Freq: Every day | Status: DC

## 2012-06-16 MED ORDER — INSULIN GLARGINE 100 UNIT/ML ~~LOC~~ SOLN: 30.0000 [IU] | Freq: Every day | SUBCUTANEOUS | Status: DC

## 2012-06-16 MED ORDER — GLYBURIDE MICRONIZED 4.5 MG PO TABS: 4.0000 mg | ORAL_TABLET | Freq: Two times a day (BID) | ORAL | Status: DC

## 2012-06-16 MED ORDER — SIMVASTATIN 40 MG PO TABS: 40.0000 mg | ORAL_TABLET | Freq: Every evening | ORAL | Status: DC

## 2012-06-16 MED ORDER — INSULIN PEN NEEDLE 32G X 4 MM MISC: 1.0000 "application " | Freq: Every day | Status: DC

## 2012-06-16 NOTE — Telephone Encounter (Signed)
Med changed to glipizide 5 mg ER - done erx  Fluctuating blood sugars (250.02)

## 2012-06-16 NOTE — Assessment & Plan Note (Addendum)
Mild chronic persistent stable for several yrs, Mult iron levels normal in past 3 yrs, has no menses; ? thallasemia or hemoglobinopathy  - for hgb electrophoresis with next draw

## 2012-06-16 NOTE — Telephone Encounter (Signed)
Ben pharmacist @ [facility] out patient pharmacy is calling with questions about the glyburide and also how many times a day the patient checks his blood sugar

## 2012-06-16 NOTE — Progress Notes (Signed)
Subjective:    Patient ID: Rachel [name], female    DOB: 04/01/1966, 47 y.o.   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   Pt denies neurological change such as new Headache, facial or extremity weakness.  Pt denies polydipsia, polyuria, or low sugar symptoms. Pt states overall good compliance with treatment and medications, good tolerability, and trying to follow lower cholesterol diet.  Pt denies worsening depressive symptoms, suicidal ideation or panic. No fever, wt loss, night sweats, loss of appetite, or other constitutional symptoms.  Pt states good ability with ADL's, low fall risk, home safety reviewed and adequate, no significant changes in hearing or vision, and occasionally active with exercise.  Larey Seat 3 times in dec 2013 with unexplained weakness recurring;   Due for mamogram next mo. Past Medical History  Diagnosis Date  . DIABETES MELLITUS, TYPE II 08/02/2007  . HYPERLIPIDEMIA 08/02/2007  . COMMON MIGRAINE 05/15/2009  . ALLERGIC RHINITIS 10/04/2007  . GERD 08/02/2007  . INTERMITTENT VERTIGO 05/15/2009  . INSOMNIA-SLEEP DISORDER-UNSPEC 01/04/2008  . LIBIDO, DECREASED 01/09/2010  . Anemia 01/21/2011  . ASTHMA 08/02/2007    INHALERS ONLY IN [place]   Past Surgical History  Procedure Date  . Cesarean section   . S/p left knee surgury   . Parotidectomy 10/21/2011  . Parotidectomy 10/21/2011    Procedure: PAROTIDECTOMY;  Surgeon: Christia Reading, MD;  Location: [facility] OR;  Service: ENT;  Laterality: Left;     reports that she has never smoked. She has never used smokeless tobacco. She reports that she does not drink alcohol or use illicit drugs. family history includes Asthma in her father; Cancer in her other; Diabetes in her father; and Hypertension in her father and mother.  There is no history of Anesthesia problems. Allergies  Allergen Reactions  . Atorvastatin     REACTION: myalgias  . Nitroglycerin    Current Outpatient Prescriptions on File Prior to Visit  Medication Sig Dispense Refill  . aspirin 81 MG EC tablet Take 81 mg by mouth daily.        . insulin glargine (LANTUS SOLOSTAR) 100 UNIT/ML injection Inject 30 Units into the skin daily.  15 mL  12  . metFORMIN (GLUCOPHAGE) 500 MG tablet Take 2 tablets (1,000 mg total) by mouth 2 (two) times daily with a meal.  360 tablet  1  . simvastatin (ZOCOR) 40 MG tablet Take 1 tablet (40 mg total) by mouth every evening.  90 tablet  3  . glipiZIDE (GLUCOTROL XL) 5 MG 24 hr tablet Take 1 tablet (5 mg total) by mouth daily.  90 tablet  3  . pantoprazole (PROTONIX) 40 MG tablet Take 1 tablet (40 mg total) by mouth daily.  90 tablet  3   Review of Systems Review of Systems  Constitutional: Negative for diaphoresis, activity change, appetite change and unexpected weight change.  HENT: Negative for hearing loss, ear pain, facial swelling, mouth sores and neck stiffness.   Eyes: Negative for pain, redness and visual disturbance.  Respiratory: Negative  for shortness of breath and wheezing.   Cardiovascular: Negative for chest pain and palpitations.  Gastrointestinal: Negative for diarrhea, blood in stool, abdominal distention and rectal pain.  Genitourinary: Negative for hematuria, flank pain and decreased urine volume.  Musculoskeletal: Negative for myalgias and joint swelling.  Skin: Negative for color change and wound.  Neurological: Negative for syncope and numbness.  Hematological: Negative for adenopathy.  Psychiatric/Behavioral:  Negative for hallucinations, self-injury, decreased concentration and agitation.      Objective:   Physical Exam BP 110/80  Pulse 79  Temp 98.4 F (36.9 C) (Oral)  Ht 5\' 1"  (1.549 m)  Wt 160 lb 2 oz (72.632 kg)  BMI 30.26 kg/m2  SpO2 98% Physical Exam  VS noted Constitutional: Pt is oriented to person, place, and time. Appears well-developed and well-nourished.  HENT:  Head: Normocephalic and atraumatic.  Right Ear: External ear normal.  Left Ear: External ear normal.  Nose: Nose normal.  Mouth/Throat: Oropharynx is clear and moist.  Eyes: Conjunctivae and EOM are normal. Pupils are equal, round, and reactive to light.  Neck: Normal range of motion. Neck supple. No JVD present. No tracheal deviation present.  Cardiovascular: Normal rate, regular rhythm, normal heart sounds and intact distal pulses.   Pulmonary/Chest: Effort normal and breath sounds normal.  Abdominal: Soft. Bowel sounds are normal. There is no tenderness.  Musculoskeletal: Normal range of motion. Exhibits no edema.  Lymphadenopathy:  Has no cervical adenopathy.  Neurological: Pt is alert and oriented to person, place, and time. Pt has normal reflexes. No cranial nerve deficit. Motor intact Skin: Skin is warm and dry. No rash noted.  Psychiatric:  Has  normal mood and affect. Behavior is normal.     Assessment & Plan:

## 2012-06-16 NOTE — Patient Instructions (Addendum)
Your EKG was OK today You will be contacted regarding the referral for: stress test Please go to XRAY in the Basement for the x-ray test You will be contacted regarding the referral for: endocrinology for the sugar, and neurology for the falls Please re-start all of your medications, your nexium was changed to protonix due to cost Your refills were all sent to the pharmacy today Please continue your efforts at being more active, low cholesterol diet, and weight control. You are otherwise up to date with prevention Thank you for enrolling in MyChart. Please follow the instructions below to securely access your online medical record. MyChart allows you to send messages to your doctor, view your test results, renew your prescriptions, schedule appointments, and more. Please return in 6 months, or sooner if needed

## 2012-06-16 NOTE — Assessment & Plan Note (Signed)

## 2012-06-16 NOTE — Telephone Encounter (Signed)
Called the pharmacy at [facility] to clarify question.  The glyburide does not come in 4.5 and they pills cannot be cut to equal out to 4.5, please advise.  ALSO they need for insurance reasons frequency of testing for the strips.  Please advise on both.

## 2012-06-17 ENCOUNTER — Encounter: Payer: Self-pay | Admitting: Internal Medicine

## 2012-06-17 NOTE — Assessment & Plan Note (Signed)
Severe chronic persistent uncontrolled, ? Compliance issue vs other- for endo referral

## 2012-06-17 NOTE — Assessment & Plan Note (Signed)
Pt states noncompliant with statin recently, to re-start zocor Lab Results  Component Value Date   LDLCALC 104* 01/15/2011

## 2012-06-17 NOTE — Assessment & Plan Note (Addendum)
Recurrent, unexplained, has seen Dr Catalina Lunger in past, I wonder if ? Recurrent orthostasis possibly related to lower volume with chronic elev BS? Though not much evidence, for referral to Dr Unice Bailey - ? Other neuromusc d/o

## 2012-06-17 NOTE — Assessment & Plan Note (Addendum)
Atypical, has mult CRF's including the uncontrolled DM, for cxr, ECG reviewed as per emr, for stress test given hx and mult CRF's

## 2012-06-21 ENCOUNTER — Ambulatory Visit: Payer: 59 | Admitting: Internal Medicine

## 2012-06-27 ENCOUNTER — Ambulatory Visit (HOSPITAL_COMMUNITY): Payer: 59 | Attending: Cardiology | Admitting: Radiology

## 2012-06-27 ENCOUNTER — Other Ambulatory Visit: Payer: Self-pay

## 2012-06-27 VITALS — BP 94/73 | Ht 60.0 in | Wt 157.0 lb

## 2012-06-27 DIAGNOSIS — R002 Palpitations: Secondary | ICD-10-CM | POA: Insufficient documentation

## 2012-06-27 DIAGNOSIS — E119 Type 2 diabetes mellitus without complications: Secondary | ICD-10-CM | POA: Insufficient documentation

## 2012-06-27 DIAGNOSIS — R0989 Other specified symptoms and signs involving the circulatory and respiratory systems: Secondary | ICD-10-CM | POA: Insufficient documentation

## 2012-06-27 DIAGNOSIS — J45909 Unspecified asthma, uncomplicated: Secondary | ICD-10-CM | POA: Insufficient documentation

## 2012-06-27 DIAGNOSIS — R0602 Shortness of breath: Secondary | ICD-10-CM

## 2012-06-27 DIAGNOSIS — R079 Chest pain, unspecified: Secondary | ICD-10-CM | POA: Insufficient documentation

## 2012-06-27 DIAGNOSIS — R0609 Other forms of dyspnea: Secondary | ICD-10-CM | POA: Insufficient documentation

## 2012-06-27 MED ORDER — TECHNETIUM TC 99M SESTAMIBI GENERIC - CARDIOLITE
33.0000 | Freq: Once | INTRAVENOUS | Status: AC | PRN
  Administered 2012-06-27: 33 via INTRAVENOUS

## 2012-06-27 MED ORDER — TECHNETIUM TC 99M SESTAMIBI GENERIC - CARDIOLITE
11.0000 | Freq: Once | INTRAVENOUS | Status: AC | PRN
  Administered 2012-06-27: 11 via INTRAVENOUS

## 2012-06-27 NOTE — Telephone Encounter (Signed)
A user error has taken place: encounter opened in error, closed for administrative reasons.

## 2012-06-27 NOTE — Progress Notes (Signed)
[facility] SITE 3 NUCLEAR MED [address] [place], [place] [address] [phone]    Cardiology Nuclear Med Study  Rachel [name] Maryelizabeth Kaufmann is a 47 y.o. female     MRN : [id number]     DOB: [birth year]  Procedure Date: 06/27/2012  Nuclear Med Background Indication for Stress Test:  Evaluation for Ischemia History:  Asthma;70yrs ago Heart Catheterization "ok per pt" Cardiac Risk Factors: IDDM Type 2 and Lipids  Symptoms:  Chest Pain >left arm/shoulder @ present 5/10 , DOE and palpitations   Nuclear Pre-Procedure Caffeine/Decaff Intake:  None > 12 hrs NPO After: 8:00pm   Lungs:  clear O2 Sat: 98% on room air. IV 0.9% NS with Angio Cath:  20g  IV Site: L Antecubital x 1, tolerated well IV Started by:  Irean Hong, RN  Chest Size (in):  34 Cup Size: C  Height: 5' (1.524 m)  Weight:  157 lb (71.215 kg)  BMI:  Body mass index is 30.66 kg/(m^2). Tech Comments:  No insulin last night or this am; no glipizide, or metformin this am.    Nuclear Med Study 1 or 2 day study: 1 day  Stress Test Type:  Stress  Reading MD: Olga Millers, MD  Order Authorizing Provider:  Oliver Barre, MD  Resting Radionuclide: Technetium 46m Sestamibi  Resting Radionuclide Dose: 10.9 mCi   Stress Radionuclide:  Technetium 89m Sestamibi  Stress Radionuclide Dose: 33.0 mCi           Stress Protocol Rest HR: 66 Stress HR: 150  Rest BP: 98/69 Stress BP: 150/72  Exercise Time (min): 6:01 METS: 7.00   Predicted Max HR: 172 bpm % Max HR: 87.21 bpm Rate Pressure Product: 46962    Dose of Adenosine (mg):  n/a Dose of Lexiscan: n/a mg  Dose of Atropine (mg): n/a Dose of Dobutamine: n/a mcg/kg/min (at max HR)  Stress Test Technologist: Frederick Peers, EMT-P  Nuclear Technologist:  Domenic Polite, CNMT     Rest Procedure:  Myocardial perfusion imaging was performed at rest 45 minutes following the intravenous administration of Technetium 67m Sestamibi. Rest ECG: NSR, Nonspecific Twave changes.  Stress  Procedure:  The patient exercised on the treadmill utilizing the Bruce Protocol for 6:01 minutes. The patient stopped due to SOB and complained of chest pain throughout .  Technetium 73m Sestamibi was injected at peak exercise and myocardial perfusion imaging was performed after a brief delay. Stress ECG: Insignificant upsloping ST segment depression.  QPS Raw Data Images:  Acquisition technically good; normal left ventricular size. Stress Images:  There is decreased uptake in the apex. Rest Images:  There is decreased uptake in the apex. Subtraction (SDS):  No evidence of ischemia. Transient Ischemic Dilatation (Normal <1.22):  1.03 Lung/Heart Ratio (Normal <0.45):  0.33  Quantitative Gated Spect Images QGS EDV:  60 ml QGS ESV:  18 ml  Impression Exercise Capacity:  Fair exercise capacity. BP Response:  Normal blood pressure response. Clinical Symptoms:  Chest pain prior to, during and after exercise. ECG Impression:  Insignificant upsloping ST segment depression. Comparison with Prior Nuclear Study: No images to compare  Overall Impression:  Normal stress nuclear study with a small, mild, fixed apical defect consistent with thinning; no ischemia.  LV Ejection Fraction: 70%.  LV Wall Motion:  NL LV Function; NL Wall Motion  Olga Millers

## 2012-08-08 ENCOUNTER — Telehealth: Payer: Self-pay | Admitting: Neurology

## 2012-08-10 ENCOUNTER — Ambulatory Visit: Payer: 59 | Admitting: Internal Medicine

## 2012-08-11 ENCOUNTER — Other Ambulatory Visit: Payer: Self-pay | Admitting: Internal Medicine

## 2012-08-24 ENCOUNTER — Ambulatory Visit (INDEPENDENT_AMBULATORY_CARE_PROVIDER_SITE_OTHER): Payer: 59 | Admitting: Internal Medicine

## 2012-08-24 ENCOUNTER — Encounter: Payer: Self-pay | Admitting: Internal Medicine

## 2012-08-24 VITALS — BP 110/64 | HR 118 | Temp 98.6°F | Ht 61.0 in | Wt 160.6 lb

## 2012-08-24 DIAGNOSIS — E119 Type 2 diabetes mellitus without complications: Secondary | ICD-10-CM

## 2012-08-24 MED ORDER — METFORMIN HCL ER 500 MG PO TB24: 1000.0000 mg | ORAL_TABLET | Freq: Two times a day (BID) | ORAL | Status: DC

## 2012-08-24 MED ORDER — EXENATIDE ER 2 MG ~~LOC~~ SUSR: 2.0000 mg | SUBCUTANEOUS | Status: DC

## 2012-08-24 NOTE — Patient Instructions (Addendum)
Please return in 1 month with your sugar log. Please start Metformin extended release: initially replace the dinnertime regular Metfomin with 1000 mg Metformin XR, then, if you can tolerate this, replace the breakfast regular Metformin with Metformin XR 1000 mg, and continue only on this medication (1000 mg twice a day). Start Bydureon 2 mg under skin injection once a week.   Patient instructions for Diabetes mellitus type 2:  DIET AND EXERCISE Diet and exercise is an important part of diabetic treatment.  We recommended aerobic exercise in the form of brisk walking (working between 40-60% of maximal aerobic capacity, similar to brisk walking) for 150 minutes per week (such as 30 minutes five days per week) along with 3 times per week performing 'resistance' training (using various gauge rubber tubes with handles) 5-10 exercises involving the major muscle groups (upper body, lower body and core) performing 10-15 repetitions (or near fatigue) each exercise. Start at half the above goal but build slowly to reach the above goals. If limited by weight, joint pain, or disability, we recommend daily walking in a swimming pool with water up to waist to reduce pressure from joints while allow for adequate exercise.    BLOOD GLUCOSES Monitoring your blood glucoses is important for continued management of your diabetes. Please check your blood glucoses 2-4 times a day: fasting, before meals and at bedtime (you can rotate these measurements - e.g. one day check before the 3 meals, the next day check before 2 of the meals and before bedtime, etc.   HYPOGLYCEMIA (low blood sugar) Hypoglycemia is usually a reaction to not eating, exercising, or taking too much insulin/ other diabetes drugs.  Symptoms include tremors, sweating, hunger, confusion, headache, etc. Treat IMMEDIATELY with 15 grams of Carbs:   4 glucose tablets    cup regular juice/soda   2 tablespoons raisins   4 teaspoons sugar   1 tablespoon  honey Recheck blood glucose in 15 mins and repeat above if still symptomatic/blood glucose <100. Please contact our office at [phone] if you have questions about how to next handle your insulin.  RECOMMENDATIONS TO REDUCE YOUR RISK OF DIABETIC COMPLICATIONS: * Take your prescribed MEDICATION(S). * Follow a DIABETIC diet: Complex carbs, fiber rich foods, heart healthy fish twice weekly, (monounsaturated and polyunsaturated) fats * AVOID saturated/trans fats, high fat foods, >2,300 mg salt per day. * EXERCISE at least 5 times a week for 30 minutes or preferably daily.  * DO NOT SMOKE OR DRINK more than 1 drink a day. * Check your FEET every day. Do not wear tightfitting shoes. Contact us if you develop an ulcer * See your EYE doctor once a year or more if needed * Get a FLU shot once a year * Get a PNEUMONIA vaccine every 5 years and once after age 22 years  GOALS:  * Your Hemoglobin A1c of 7%  * Your Systolic BP should be 130 or lower  * Your Diastolic BP should be 80 or lower  * Your HDL (Good Cholesterol) should be 40 or higher  * Your LDL (Bad Cholesterol) should be 100 or lower  * Your Triglycerides should be 150 or lower  * Your Urine microalbumin (kidney function) of <30 * Your Body Mass Index should be 25 or lower  We will be glad to help you achieve these goals. Our telephone number is: [phone].

## 2012-08-24 NOTE — Progress Notes (Signed)
Subjective:     Patient ID: Rachel [name], female   DOB: Feb 05, 1966, 47 y.o.   MRN: [id number]  HPI Rachel [name] is a pleasant 47 year old woman, referred by her PCP, Dr. Jonny Ruiz, for management of DM2, uncontrolled, insulin-dependent, with probable complications (? peripheral neuropathy).  She was dx with DM2 in 1987, when she was pregnant with her daughter. Lantus insulin was added last year, initially at 16 units, and then increase to 30 units at night. She gained 50 lbs in 1 year. She did have a history of low blood sugars.  She had a HbA1C in 05/2012: Lab Results  Component Value Date   HGBA1C 10.1* 06/13/2012  Before this, in 03/2012: 15.2%, after which insulin was added.  She is on: - Lantus: 30 units in HS - Metformin 500 mg bid (could not tolerate the high dose >> diarrhea) - Januvia (not prescribed recently, but she was having some more at home, of which is about to run out) - was supposed to take Amaryl, but not taking it is because she read about the side effects  She checks her sugars ~ once a day - sugars in the am are dependent on her dinner - e.g. 2 days ago: 3 slices of pizza the night before and sugar next am 229. This was the highest CBG that she saw. Her regular range 123-260. She had more lows before she stopped glimepiride. She has hypoglycemia awareness in the 60s. Lowest sugar 58 at night - woken up sweating, hot. She keeps her Lantus insulin in the fridge, even if in use.  She does not exercise regularly as she works 7-7 shift 3 times a week. She is a diet composed of: - Breakfast: Grits, eggs - Lunch: Rice, meat, beans - Dinner: Leftovers - Snacks: popcorn, etc, She mentions that she fairly frequently skips meals due to her busy schedule. She has seen nutrition on 07/10/2012, and she was advised about including proteins vegetables and sometimes carbs in her diet and avoiding pork, starches, sweets. She was also advised to use 30-45 g of carbs with each meal and 15 g of  carbs per snack.   Last eye appt: last year >> reportedly no DR. No CKD. Has neuropathy R leg.   She has FH of DM in father and aunt.  Review of Systems Constitutional: + weight gain, + fatigue, + hot flashes, poor sleep, nocturia more than once a night Eyes: plus blurry vision, no xerophthalmia ENT: no sore throat, no nodules palpated in throat, no dysphagia/odynophagia, no hoarseness Cardiovascular: no CP/SOB/palpitations/leg swelling Respiratory: no cough/SOB Gastrointestinal: no N/V/D/C, last heartburn Musculoskeletal: no muscle/joint aches Skin: no rashes Neurological: no tremors/numbness/tingling/dizziness Psychiatric: no depression/anxiety Low libido Past Medical History  Diagnosis Date  . DIABETES MELLITUS, TYPE II 08/02/2007  . HYPERLIPIDEMIA 08/02/2007  . COMMON MIGRAINE 05/15/2009  . ALLERGIC RHINITIS 10/04/2007  . GERD 08/02/2007  . INTERMITTENT VERTIGO 05/15/2009  . INSOMNIA-SLEEP DISORDER-UNSPEC 01/04/2008  . LIBIDO, DECREASED 01/09/2010  . Anemia 01/21/2011  . ASTHMA 08/02/2007    INHALERS ONLY IN [place]    Past Surgical History  Procedure Laterality Date  . Cesarean section    . S/p left knee surgury    . Parotidectomy  10/21/2011  . Parotidectomy  10/21/2011    Procedure: PAROTIDECTOMY;  Surgeon: Christia Reading, MD;  Location: [facility] OR;  Service: ENT;  Laterality: Left;   History   Social History  . Marital Status: Married    Spouse Name: N/A    Number  of Children: 3  . Years of Education: N/A   Occupational History  . NURSE [employer]    Social History Main Topics  . Smoking status: Never Smoker   . Smokeless tobacco: Never Used  . Alcohol Use: No  . Drug Use: No  . Sexually Active: Yes   Other Topics Concern  . Not on file   Social History Narrative   She has 3 daughters, 52, 33, 72 yo lives with her.   Current Outpatient Prescriptions on File Prior to Visit  Medication Sig Dispense Refill  . aspirin 81 MG EC tablet Take 81 mg by mouth daily.        Marland Kitchen  glucose blood (TRUETEST TEST) test strip Use as directed once daily.  Diagnosis code 250.02  100 each  12  . insulin glargine (LANTUS SOLOSTAR) 100 UNIT/ML injection Inject 30 Units into the skin daily.  15 mL  12  . Insulin Pen Needle (BD PEN NEEDLE NANO U/F) 32G X 4 MM MISC 1 application by Does not apply route daily.  100 each  12  . Lancets MISC 1 application by Does not apply route daily.  100 each  12  . pantoprazole (PROTONIX) 40 MG tablet Take 1 tablet (40 mg total) by mouth daily.  90 tablet  3  . simvastatin (ZOCOR) 40 MG tablet Take 1 tablet (40 mg total) by mouth every evening.  90 tablet  3   No current facility-administered medications on file prior to visit.   Allergies  Allergen Reactions  . Atorvastatin     REACTION: myalgias  . Nitroglycerin    Family History  Problem Relation Age of Onset  . Hypertension Mother   . Hypertension Father   . Diabetes Father   . Asthma Father   . Cancer Other     cervical cancer  . Anesthesia problems Neg Hx    Objective:   Physical Exam BP 110/64  Pulse 118  Temp(Src) 98.6 F (37 C) (Oral)  Ht 5\' 1"  (1.549 m)  Wt 160 lb 9 oz (72.831 kg)  BMI 30.35 kg/m2  SpO2 98% Wt Readings from Last 3 Encounters:  08/24/12 160 lb 9 oz (72.831 kg)  06/27/12 157 lb (71.215 kg)  06/16/12 160 lb 2 oz (72.632 kg)  Constitutional: overweight, in NAD Eyes: PERRLA, EOMI, no exophthalmos ENT: moist mucous membranes, no thyromegaly, no cervical lymphadenopathy Cardiovascular: RRR, No MRG Respiratory: CTA B Gastrointestinal: abdomen soft, NT, ND, BS+ Musculoskeletal: no deformities, strength intact in all 4 Skin: moist, warm, no rashes, no nodules or bruises at the insulin injection sites Neurological: no tremor with outstretched hands, DTR normal in all 4  Assessment:     1. DM2, uncontrolled, insulin-dependent, with probable complications - ? peripheral neuropathy  Plan:     Patient with uncontrolled diabetes, however improved after  addition of insulin at the end of last year. She did have episodes of hypoglycemia, most likely due to the presence of Amaryl at 4 mg daily and her not being able to eat all the meals.  - She is not at goal on her dose of metformin, due to the presence of diarrhea whenever she tries to increase the dose to higher than 500 mg twice a day; and even this dose she initially could not tolerate. I suggested to her that we switch to metformin extended release 1000 mg twice a day, and if she can tolerate this we can group it to 2000 mg daily. She agrees to try. -  she did the right thing stopping Amaryl, we will not continue it - she asks me about a diabetes medication that you can take once a month that she heard about, I believe she is referring to [facility], which is however taken once a week. She would be very interested in this, especially since she heard that it can help with weight loss. I agree with her, and will send to her pharmacy, hopefully it will be covered. - I advised her to keep the insulin in use out of the fridge - I advised her to check her sugars at least twice a day before meals and at bedtime, rotating check times - given sugar log and advised how to fill it and to bring it at next appt - given foot care handout and explained the principles - given instructions for hypoglycemia management "15-15 rule" - I will see her back in a month with her sugar log

## 2012-09-06 ENCOUNTER — Ambulatory Visit (INDEPENDENT_AMBULATORY_CARE_PROVIDER_SITE_OTHER): Payer: Self-pay | Admitting: Family Medicine

## 2012-09-06 VITALS — BP 108/80 | Wt 157.0 lb

## 2012-09-06 DIAGNOSIS — E119 Type 2 diabetes mellitus without complications: Secondary | ICD-10-CM

## 2012-09-06 NOTE — Progress Notes (Signed)
Patient presents today for yearly pharmacy consult as part of the employer-sponsored [facility] program. This patient is normally managed by Cranford Mon, RN care manager. After this visit, she will resume visits with Marylu Lund. Current DM regimen includes Bydureon, Lantus, Glipizide, and Metformin. Patient also continues on daily ASA and statin. Diabetes provider is Dr. Lafe Garin, endocrinologist. She will follow-up in April.   Diabetes Assessment: Type of Diabetes: Type 2; MD managing Diabetes Carlus Pavlov, endo; checks feet daily; checks blood glucose once daily; hypoglycemia frequency Rare; Lowest CBG 90; Highest CBG 140; takes an aspirin a day; A1c 10.1. Other Diabetes History: Current DM regimen includes Bydureon 2 mg weekly, Lantus 30 units daily, glipizide ER 5 mg daily, and Metformin ER 500 mg - 2 tablets twice daily. Bydureon was added 2 weeks ago. Patient is tolerating this well, but does report bruising at the injection site. The bruising is mild but noticeable and remains after two weeks. Patient is somewhat bothered by this but is willing to continue therapy if it will help her gain control of DM. She generally uses a nano sized pen needle for lantus injection, so moving to the lower gauge bydureon needle has been a bit uncomfortable.  Pt did not bring meter today, but reports testing once daily on most day, fasting. Per patient report, most readings are 90-120 on average. No hypoglycemia in previous month. Patient demonstrates an understanding of how to properly correct hypoglycemia. She denies numbness, tingling, burning or signs of infection in feet or toes. 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 Patient is due for an eye exam and is looking for a new ophthalmologist.   Lifestyle Factors: Exercise - No routine exercise at this time. 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 Patient states her main  barrier to exercise is lack of time. However, she has at least one day off each week and reports she will attemt to use this day for exercise. She has set a goal to walk 15 minutes one day per week.   Diet - Patient reports making significant changes in the past, but feels her diet still has room for improvement. Patients culture includes a lot of rice, beans, and corn, all of which she enjoys. However, she states that she has limited these a great deal. In place of starches, patient has increased vegetable intake through cabbage, beets, salads, etc. Despite these positive changes patient feels she has gained weight. She is somewhat discouraged by this. At this time she is not ready to set any dietary goals, but would rather focus on exercise and maintaining her current dietary changes. Also, patient participates in fasting with her church and will have fewer calories on certain days. I have asked her to use caution on these days and to be aware of the symptoms of hypoglycemia. She is very cognizant of this.  Assessment: This is a pleasant patient with somewhat poor control of her diabetes. A1c remains elevated at 10.1. Patient seems to be well educated but may need continued coaching in diet and exercise. She has set some small goals for lifestyle changes and will work on these over the coming months. Patient will follow-up with Marylu Lund, RN for continued [facility] care managment. .  Plan: 1) Continue making healthy dietary choices 2) Attempt to walk for 15 minutes at least one day per week 3) Continue testing regularly and  being compliant with medication 4) Follow-up with Marylu Lund - call to make an appt.

## 2012-09-12 ENCOUNTER — Other Ambulatory Visit: Payer: Self-pay | Admitting: Occupational Medicine

## 2012-09-12 ENCOUNTER — Ambulatory Visit: Payer: Self-pay

## 2012-09-12 DIAGNOSIS — R7612 Nonspecific reaction to cell mediated immunity measurement of gamma interferon antigen response without active tuberculosis: Secondary | ICD-10-CM

## 2012-09-27 NOTE — Progress Notes (Signed)
Patient ID: Rachel [name], female   DOB: Mar 13, 1966, 48 y.o.   MRN: [id number] ATTENDING PHYSICIAN NOTE: I have reviewed the chart and agree with the plan as detailed above. Denny Levy MD Pager [phone]

## 2012-10-02 ENCOUNTER — Encounter: Payer: Self-pay | Admitting: Internal Medicine

## 2012-10-02 ENCOUNTER — Ambulatory Visit: Payer: 59 | Admitting: Neurology

## 2012-10-02 ENCOUNTER — Ambulatory Visit (INDEPENDENT_AMBULATORY_CARE_PROVIDER_SITE_OTHER): Payer: 59 | Admitting: Internal Medicine

## 2012-10-02 VITALS — BP 102/62 | HR 88 | Temp 99.2°F | Resp 10 | Wt 154.0 lb

## 2012-10-02 DIAGNOSIS — E119 Type 2 diabetes mellitus without complications: Secondary | ICD-10-CM

## 2012-10-02 LAB — HEMOGLOBIN A1C: Hgb A1c MFr Bld: 8.6 % — ABNORMAL HIGH (ref 4.6–6.5)

## 2012-10-02 MED ORDER — EXENATIDE ER 2 MG ~~LOC~~ SUSR: 2.0000 mg | SUBCUTANEOUS | Status: DC

## 2012-10-02 NOTE — Patient Instructions (Signed)
Please continue the current management. Please check sugars at least 2 x a day before meals and at bedtime. Please return in 3 months.

## 2012-10-02 NOTE — Progress Notes (Signed)
Subjective:     Patient ID: Rachel [name], female   DOB: 1965-11-11, 47 y.o.   MRN: [id number]  HPI Rachel [name] is a pleasant 47 year old woman, referred by her PCP, Dr. Jonny Ruiz, for management of DM2, dx 1987, uncontrolled, insulin-dependent, with probable complications (? peripheral neuropathy). Last visit was on 08/24/2012.  Last HbA1C in 05/2012: Lab Results  Component Value Date   HGBA1C 10.1* 06/13/2012  Before this, in 03/2012: 15.2%, after which insulin was added.  She is on: - Lantus: 30 units in HS - Metformin XR 500 mg bid - not since last week since she had oral surgery b/c not eating well and 70s - felt this, had some juice - Bydureon 2 milligrams weekly  She checks her sugars ~ once a day. Her CBG range last time was 123-260, now she reports the following sugars: - am: 90's - sugars after meals in the 100's, e.g. 2h after pizza: 136 She has hypoglycemia awareness in the 60s.  She does not exercise regularly as she works 7-7 shift 3 times a week.   She mentions that she fairly frequently skips meals due to her busy schedule. She has seen nutrition on 07/10/2012, and she was advised about including proteins vegetables and sometimes carbs in her diet and avoiding pork, starches, sweets. She was also advised to use 30-45 g of carbs with each meal and 15 g of carbs per snack.   Last eye appt: last year >> reportedly no DR. No CKD. Has neuropathy R leg.   Review of Systems Constitutional: + weight loss and decreased appetite especially with her recent oral surgery, + fatigue, + hot flashes Eyes: + blurry vision, no xerophthalmia ENT: no sore throat, no nodules palpated in throat, no dysphagia/odynophagia, no hoarseness Cardiovascular: no CP/SOB/palpitations/leg swelling Respiratory: no cough/SOB Gastrointestinal: no N/V/D/C, last heartburn Musculoskeletal: no muscle/joint aches Skin: no rashes Neurological: no tremors/numbness/tingling/dizziness Psychiatric: no  depression/anxiety  I reviewed pt's medications, allergies, PMH, social hx, family hx and no changes required.  Objective:   Physical Exam BP 102/62  Pulse 88  Temp(Src) 99.2 F (37.3 C) (Oral)  Resp 10  Wt 154 lb (69.854 kg)  BMI 29.11 kg/m2  SpO2 97% Wt Readings from Last 3 Encounters:  10/02/12 154 lb (69.854 kg)  09/06/12 157 lb (71.215 kg)  08/24/12 160 lb 9 oz (72.831 kg)  Constitutional: overweight, in NAD Eyes: PERRLA, EOMI, no exophthalmos ENT: moist mucous membranes, no thyromegaly, no cervical lymphadenopathy Cardiovascular: RRR, No MRG Respiratory: CTA B Gastrointestinal: abdomen soft, NT, ND, BS+ Musculoskeletal: no deformities, strength intact in all 4 Skin: moist, warm, no rashes, no nodules or bruises at the insulin injection sites Neurological: no tremor with outstretched hands, DTR normal in all 4  Assessment:     1. DM2, uncontrolled, insulin-dependent, with probable complications - ? peripheral neuropathy  Plan:     Patient with previously uncontrolled diabetes, however improved after addition of insulin at the end of last year. She did have episodes of hypoglycemia, most likely due to the presence of Amaryl at 4 mg daily and her not being able to eat all the meals. We stopped Amaryl at last visit, and started Bydureon once a week, which he tolerates fine. Will continue Bydureon. - we could not advance metformin to goal dose because of diarrhea, but we switched to extended-release metformin which he can tolerate fine, but up to a dose of 500 mg twice a day. She did not take this in the last 3 days  because she was not able to eat well due to oral surgery. She will resume metformin XR 500 mg twice a day when she can eat better. - also continue Lantus 30 units daily - I gave her new sugar logs and I advised her to start writing her sugars down, and she does not do this now; I advised her to check her sugars at least twice a day before meals and at bedtime,  rotating check times - Will check a hemoglobin A1c today - I will see her back in 3 months with her sugar log  Office Visit on 10/02/2012  Component Date Value Range Status  . Hemoglobin A1C 10/02/2012 8.6* 4.6 - 6.5 % Final   Glycemic Control Guidelines for People with Diabetes:Non Diabetic:  <6%Goal of Therapy: <7%Additional Action Suggested:  >8%    HbA1C decreased from 10.1%. Sent message through [employer].

## 2012-10-03 ENCOUNTER — Telehealth: Payer: Self-pay | Admitting: *Deleted

## 2012-10-03 NOTE — Telephone Encounter (Signed)
Carollee Herter, does she have any Q's? I did not call her, but sent her results through MyChart yesterday.Marland KitchenMarland Kitchen

## 2012-10-03 NOTE — Telephone Encounter (Signed)
Called pt and let her know that Dr Elvera Lennox did not call her. Pt understood.

## 2012-10-03 NOTE — Telephone Encounter (Signed)
Pt returning call to Dr Elvera Lennox. [phone]. Please advise.

## 2012-10-03 NOTE — Telephone Encounter (Signed)
No, I did not call.

## 2012-10-03 NOTE — Telephone Encounter (Signed)
She stated that someone from our office called her and she assumed it was you and was returning the call. Please advise.

## 2012-11-07 LAB — HM DIABETES EYE EXAM

## 2012-11-20 ENCOUNTER — Ambulatory Visit (INDEPENDENT_AMBULATORY_CARE_PROVIDER_SITE_OTHER): Payer: 59 | Admitting: Internal Medicine

## 2012-11-20 ENCOUNTER — Encounter: Payer: Self-pay | Admitting: Internal Medicine

## 2012-11-20 VITALS — BP 110/62 | HR 117 | Temp 98.8°F | Resp 10 | Wt 155.0 lb

## 2012-11-20 DIAGNOSIS — E119 Type 2 diabetes mellitus without complications: Secondary | ICD-10-CM

## 2012-11-20 NOTE — Patient Instructions (Addendum)
Please decrease Lantus to 15 units. Restart Metformin XR at 500 mg 2x a day with meals. Continue Bydureon. If suars in am <120 in a week, can stop Lantus.  Please let me know your sugars through MYChart. Return in another 1.5 months.

## 2012-11-20 NOTE — Progress Notes (Signed)
Subjective:     Patient ID: Rachel [name], female   DOB: 08/15/1965, 47 y.o.   MRN: [id number]  HPI Rachel [name] is a pleasant 47 year old woman, returning for f/u for DM2, dx 1987, uncontrolled, insulin-dependent, with probable complications (? peripheral neuropathy). Last visit was 1.5 mo ago.  Last HbA1C: Lab Results  Component Value Date   HGBA1C 8.6* 10/02/2012  Prev. 10.1%.  She is on: - Lantus: 30 units in HS - Bydureon 2 mg weekly Not taking Metformin XR 500 mg bid since she got lows while on it - stopped it 2 weeks ago.   She checks her sugars ~ 1-2 a day. We reviewed her log together. Her CBGs are almost all at target: - am: 70-120 - later: 100-130 - in the 120s since stopping Metformin She has hypoglycemia awareness in the 60s. Lowest sugar - last month: 69. Not since stopping Metformin.  She does not exercise regularly as she works 7-7 shift 3 times a week.  She was mentioning that she fairly frequently skips meals due to her busy schedule. She has seen nutrition on 07/10/2012, and she was advised about including proteins vegetables and sometimes carbs in her diet and avoiding pork, starches, sweets. She was also advised to use 30-45 g of carbs with each meal and 15 g of carbs per snack.   Last eye appt: 2 weeks ago >> reportedly no DR. No CKD. Has neuropathy R leg.   Review of Systems Constitutional: + decreased appetite, no weight changes, no fatigue, no hot flashes Eyes: + blurry vision, no xerophthalmia ENT: no sore throat, no nodules palpated in throat, no dysphagia/odynophagia, no hoarseness Cardiovascular: no CP/SOB/palpitations/leg swelling Respiratory: + cough/no SOB Gastrointestinal: + N - once a week/no V/D/C, no heartburn Musculoskeletal: no muscle/joint aches Skin: no rashes Neurological: no tremors/numbness/tingling/dizziness Psychiatric: no depression/anxiety Low libido  I reviewed pt's medications, allergies, PMH, social hx, family hx and no changes  required.  Objective:   Physical Exam BP 110/62  Pulse 117  Temp(Src) 98.8 F (37.1 C) (Oral)  Resp 10  Wt 155 lb (70.308 kg)  BMI 29.3 kg/m2  SpO2 98% Wt Readings from Last 3 Encounters:  11/20/12 155 lb (70.308 kg)  10/02/12 154 lb (69.854 kg)  09/06/12 157 lb (71.215 kg)  Constitutional: overweight, in NAD Eyes: PERRLA, EOMI, no exophthalmos, colored contacts ENT: moist mucous membranes, no thyromegaly, no cervical lymphadenopathy Cardiovascular: tachycardia, RR, No MRG Respiratory: CTA B Gastrointestinal: abdomen soft, NT, ND, BS+ Musculoskeletal: no deformities, strength intact in all 4 Skin: moist, warm, no rashes Neurological: no tremor with outstretched hands, DTR normal in all 4  Assessment:     1. DM2, uncontrolled, insulin-dependent, with probable complications - ? peripheral neuropathy  Plan:     Patient with previously uncontrolled diabetes, however improved after addition of insulin at the end of last year. She did have episodes of hypoglycemia with Amaryl in the past. She tells me she is not taking the metformin XR since she had some low sugars in the 40s and 50s while taking the medicine. I explained that these were more slightly do to her Lantus 30 units, and I encouraged her to cut down on the Lantus to 15 units, and restart metformin XR at 500 mg twice a day. My plan is to stop Lantus in the near future, if possible. If her sugars start to go up or she cannot tolerate the metformin XR (she had diarrhea with regular metformin), then will continue with the current  regimen. - Will continue Bydureon. She has some nausea, once a week, unclear if this is from [place]. - decrease Lantus to 15 units daily - continue to check sugars 1-2x a day - I will see her back in 1.5 months with her sugar log

## 2012-12-18 ENCOUNTER — Encounter: Payer: Self-pay | Admitting: Internal Medicine

## 2012-12-18 ENCOUNTER — Ambulatory Visit (INDEPENDENT_AMBULATORY_CARE_PROVIDER_SITE_OTHER): Payer: 59 | Admitting: Internal Medicine

## 2012-12-18 VITALS — BP 112/68 | HR 113 | Temp 99.0°F | Wt 158.0 lb

## 2012-12-18 DIAGNOSIS — Z0000 Encounter for general adult medical examination without abnormal findings: Secondary | ICD-10-CM

## 2012-12-18 DIAGNOSIS — F3289 Other specified depressive episodes: Secondary | ICD-10-CM

## 2012-12-18 DIAGNOSIS — E785 Hyperlipidemia, unspecified: Secondary | ICD-10-CM

## 2012-12-18 DIAGNOSIS — R519 Headache, unspecified: Secondary | ICD-10-CM | POA: Insufficient documentation

## 2012-12-18 DIAGNOSIS — R51 Headache: Secondary | ICD-10-CM

## 2012-12-18 DIAGNOSIS — F329 Major depressive disorder, single episode, unspecified: Secondary | ICD-10-CM

## 2012-12-18 MED ORDER — TOPIRAMATE 50 MG PO TABS: 50.0000 mg | ORAL_TABLET | Freq: Two times a day (BID) | ORAL | Status: DC

## 2012-12-18 MED ORDER — ESOMEPRAZOLE MAGNESIUM 40 MG PO CPDR: 40.0000 mg | DELAYED_RELEASE_CAPSULE | Freq: Every day | ORAL | Status: DC

## 2012-12-18 NOTE — Assessment & Plan Note (Signed)
stable overall by history and exam, recent data reviewed with pt, and pt to continue medical treatment as before,  to f/u any worsening symptoms or concerns Lab Results  Component Value Date   WBC 8.2 06/13/2012   HGB 11.1* 06/13/2012   HCT 34.7* 06/13/2012   PLT 418.0* 06/13/2012   GLUCOSE 139* 06/13/2012   CHOL 241* 06/13/2012   TRIG 168.0* 06/13/2012   HDL 44.30 06/13/2012   LDLDIRECT 178.8 06/13/2012   LDLCALC 104* 01/15/2011   ALT 15 06/13/2012   AST 16 06/13/2012   NA 137 06/13/2012   K 4.2 06/13/2012   CL 102 06/13/2012   CREATININE 0.6 06/13/2012   BUN 9 06/13/2012   CO2 28 06/13/2012   TSH 0.90 06/13/2012   INR 1.08 07/23/2009   HGBA1C 8.6* 10/02/2012   MICROALBUR 0.2 06/13/2012

## 2012-12-18 NOTE — Assessment & Plan Note (Signed)
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

## 2012-12-18 NOTE — Progress Notes (Signed)
Subjective:    Patient ID: Rachel [name], female    DOB: 1965/07/16, 47 y.o.   MRN: [id number]  HPI  Here with 2 mo constant HA, present go to sleep, as well as in the AM, primarily occipital with traveling to bilat head to temples, occas severe, throbbing, nausea occasional, + photophobia, not really worse with movement or exercise.  No fever except 2 wks ago and today has low grade temp, just dosnt feel good.  Allergy/asthma not really worse last few months. Stress of work not really making worse, currently working 3 days per wk, still has HA on off days. Coincidently protonix even twide per day not working, asking to go back to nexium. 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 DIABETES MELLITUS, TYPE II 08/02/2007  . HYPERLIPIDEMIA 08/02/2007  . COMMON MIGRAINE 05/15/2009  . ALLERGIC RHINITIS 10/04/2007  . GERD 08/02/2007  . INTERMITTENT VERTIGO 05/15/2009  . INSOMNIA-SLEEP DISORDER-UNSPEC 01/04/2008  . LIBIDO, DECREASED 01/09/2010  . Anemia 01/21/2011  . ASTHMA 08/02/2007    INHALERS ONLY IN [place]   Past Surgical History  Procedure Laterality Date  . Cesarean section    . S/p left knee surgury    . Parotidectomy  10/21/2011  . Parotidectomy  10/21/2011    Procedure: PAROTIDECTOMY;  Surgeon: Christia Reading, MD;  Location: [facility] OR;  Service: ENT;  Laterality: Left;    reports that she has never smoked. She has never used smokeless tobacco. She reports that she does not drink alcohol or use illicit drugs. family history includes Asthma in her father; Cancer in her other; Diabetes in her father; and Hypertension in her father and mother.  There is no history of Anesthesia problems. Allergies  Allergen Reactions  . Atorvastatin     REACTION: myalgias  . Nitroglycerin    Current Outpatient Prescriptions on File Prior to Visit  Medication Sig Dispense Refill  . aspirin 81 MG EC  tablet Take 81 mg by mouth daily.        . Exenatide (BYDUREON) 2 MG SUSR Inject 2 mg into the skin once a week.  12 each  3  . glucose blood (TRUETEST TEST) test strip Use as directed once daily.  Diagnosis code 250.02  100 each  12  . insulin glargine (LANTUS SOLOSTAR) 100 UNIT/ML injection Inject 30 Units into the skin daily.  15 mL  12  . Insulin Pen Needle (BD PEN NEEDLE NANO U/F) 32G X 4 MM MISC 1 application by Does not apply route daily.  100 each  12  . Lancets MISC 1 application by Does not apply route daily.  100 each  12  . metFORMIN (GLUCOPHAGE XR) 500 MG 24 hr tablet Take 2 tablets (1,000 mg total) by mouth 2 (two) times daily with a meal.  120 tablet  3  . pantoprazole (PROTONIX) 40 MG tablet Take 1 tablet (40 mg total) by mouth daily.  90 tablet  3  . simvastatin (ZOCOR) 40 MG tablet Take 1 tablet (40 mg total) by mouth every evening.  90 tablet  3   No current facility-administered medications on file prior to visit.   Review of Systems  Constitutional: Negative for unexpected weight change, or unusual diaphoresis  HENT: Negative for tinnitus.   Eyes: Negative for photophobia and visual disturbance.  Respiratory: Negative for choking and stridor.   Gastrointestinal: Negative for vomiting and blood  in stool.  Genitourinary: Negative for hematuria and decreased urine volume.  Musculoskeletal: Negative for acute joint swelling Skin: Negative for color change and wound.  Neurological: Negative for tremors and numbness other than noted  Psychiatric/Behavioral: Negative for decreased concentration or  hyperactivity.       Objective:   Physical Exam BP 112/68  Pulse 113  Temp(Src) 99 F (37.2 C) (Oral)  Wt 158 lb (71.668 kg)  BMI 29.87 kg/m2  SpO2 97% VS noted,  Constitutional: Pt appears well-developed and well-nourished.  HENT: Head: NCAT.  Right Ear: External ear normal.  Left Ear: External ear normal.  Eyes: Conjunctivae and EOM are normal. Pupils are equal,  round, and reactive to light.  Neck: Normal range of motion. Neck supple.  Cardiovascular: Normal rate and regular rhythm.   Pulmonary/Chest: Effort normal and breath sounds normal.  Abd:  Soft, NT, non-distended, + BS Neurological: Pt is alert. Not confused , motor 5/5 Skin: Skin is warm. No erythema.  Psychiatric: Pt behavior is normal. Thought content normal. Not depressed affect, mild nervous only     Assessment & Plan:

## 2012-12-18 NOTE — Assessment & Plan Note (Signed)
Lab Results  Component Value Date   LDLCALC 104* 01/15/2011   D/w pt - stable overall by history and exam, recent data reviewed with pt, and pt to continue medical treatment as before,  to f/u any worsening symptoms or concerns For cont'd diet, better DM control, fu next visit

## 2012-12-18 NOTE — Patient Instructions (Addendum)
Please take all new medication as prescribed - but the topamax should be started HALF pill per night for 3 nights, then HALF pill twice per day for 3 days, then HALF pill in the AM/Whole pill at night for 3 days, then whole pill twice per day after that  OK to stop the protonix  Please take all new medication as prescribed- the nexium 40 mg per day  You will be contacted regarding the referral for: [facility]  Please continue all other medications as before, and refills have been done if requested.  Please keep your appointments with your specialists as you have planned - for sugar  Please return in 6 months, or sooner if needed, with Lab testing done 3-5 days before

## 2012-12-19 ENCOUNTER — Ambulatory Visit: Payer: 59 | Admitting: Internal Medicine

## 2012-12-20 ENCOUNTER — Telehealth: Payer: Self-pay | Admitting: Internal Medicine

## 2012-12-20 NOTE — Telephone Encounter (Signed)
Per [facility] pt declined  appt at this time  due to cost

## 2012-12-28 ENCOUNTER — Telehealth: Payer: Self-pay

## 2012-12-28 DIAGNOSIS — G43709 Chronic migraine without aura, not intractable, without status migrainosus: Secondary | ICD-10-CM

## 2012-12-28 DIAGNOSIS — IMO0002 Reserved for concepts with insufficient information to code with codable children: Secondary | ICD-10-CM

## 2012-12-28 MED ORDER — BUTALBITAL-APAP-CAFFEINE 50-325-40 MG PO TABS: 1.0000 | ORAL_TABLET | Freq: Four times a day (QID) | ORAL | Status: DC | PRN

## 2012-12-28 NOTE — Telephone Encounter (Signed)
Informed of referral.  The patient would like an alternative medication for her headache

## 2012-12-28 NOTE — Telephone Encounter (Signed)
Done hardcopy to robin  

## 2012-12-28 NOTE — Telephone Encounter (Signed)
The patient was unable to go to [facility] clinic due to cost for her being around $300.  She is still having headaches as described at her last OV.  Topamax is not working.  Please advise what to do.  She works for the [employer] and would like to know if there is someone in the system she could see that would be less expensive?

## 2012-12-28 NOTE — Telephone Encounter (Signed)
Ok to see [facility] - done per emr

## 2012-12-29 NOTE — Telephone Encounter (Signed)
Pt call back, inform pt of the massage below.

## 2012-12-29 NOTE — Telephone Encounter (Signed)
Faxed hardcopy to pharmacy. 

## 2013-01-04 ENCOUNTER — Ambulatory Visit: Payer: Self-pay | Admitting: Internal Medicine

## 2013-01-11 ENCOUNTER — Ambulatory Visit (INDEPENDENT_AMBULATORY_CARE_PROVIDER_SITE_OTHER): Payer: 59 | Admitting: Internal Medicine

## 2013-01-11 ENCOUNTER — Encounter: Payer: Self-pay | Admitting: Internal Medicine

## 2013-01-11 VITALS — BP 102/58 | HR 94 | Temp 98.7°F | Resp 12 | Wt 153.0 lb

## 2013-01-11 DIAGNOSIS — E119 Type 2 diabetes mellitus without complications: Secondary | ICD-10-CM

## 2013-01-11 LAB — HEMOGLOBIN A1C: Hgb A1c MFr Bld: 8.4 % — ABNORMAL HIGH (ref 4.6–6.5)

## 2013-01-11 MED ORDER — SITAGLIPTIN PHOSPHATE 100 MG PO TABS: 100.0000 mg | ORAL_TABLET | Freq: Every day | ORAL | Status: DC

## 2013-01-11 NOTE — Progress Notes (Signed)
Subjective:     Patient ID: Rachel [name], female   DOB: September 12, 1965, 47 y.o.   MRN: [id number]  HPI [name] is a pleasant 47 year old woman, returning for f/u for DM2, dx 1987, uncontrolled, insulin-dependent, with probable complications (? peripheral neuropathy). Last visit was 1.5 mo ago.  Last HbA1C: Lab Results  Component Value Date   HGBA1C 8.6* 10/02/2012  Prev. 10.1%.  She is on: - Lantus: 15 units, decreased from 30 units in HS  - she stopped Bydureon 2 mg weekly b/c nausea - started Metformin XR 500 mg bid >> some diarrhea, but tolerable She had episodes of hypoglycemia with Amaryl in the past.  She checks her sugars ~ 1-2 a day. Does not bring log. Her CBGs increased after stopping Bydureon. - am: 70-120 >> now 140-150s - later: 100-130 >> not checking She has hypoglycemia awareness in the 60s. Lowest sugar - last month: 69. Not since stopping Metformin.  Pt has had HAs (starting in shoulders) and has seen dr Rachel [name] and was referred to the HA clinic, but she could not afford the fee. The HAs started about 2 months before. She has an appt with neurology on 01/25/2013.   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  She was mentioning that she fairly frequently skips meals due to her busy schedule, but not anymore.  - She has seen nutrition on 07/10/2012, and she was advised about including proteins vegetables and sometimes carbs in her diet and avoiding pork, starches, sweets. She was also advised to use 30-45 g of carbs with each meal and 15 g of carbs per snack.  - Last eye appt: 2 mo ago >> reportedly no DR - No CKD. Last ACR 0.3 in 05/2012. - Has neuropathy R leg.   This month, she had a positive PPD and started INH >> had to stop to see if HAs improve.   Review of Systems Constitutional: + decreased appetite, no weight changes, no fatigue, no hot flashes Eyes: + blurry vision, no xerophthalmia ENT: no sore throat, no  nodules palpated in throat, no dysphagia/odynophagia, no hoarseness Cardiovascular: no CP/SOB/palpitations/leg swelling Respiratory: + cough/no SOB Gastrointestinal: + N - once a week/no V/D/C, no heartburn Musculoskeletal: no muscle/joint aches Skin: no rashes Neurological: no tremors/numbness/tingling/dizziness Psychiatric: no depression/anxiety Low libido  I reviewed pt's medications, allergies, PMH, social hx, family hx and no changes required except as above, + addition of nightly Topamax and prn Fioricet.   Objective:   Physical Exam BP 102/58  Pulse 94  Temp(Src) 98.7 F (37.1 C) (Oral)  Resp 12  Wt 153 lb (69.4 kg)  BMI 28.92 kg/m2  SpO2 98% Wt Readings from Last 3 Encounters:  01/11/13 153 lb (69.4 kg)  12/18/12 158 lb (71.668 kg)  11/20/12 155 lb (70.308 kg)  Constitutional: overweight, in NAD Eyes: PERRLA, EOMI, no exophthalmos, colored contacts ENT: moist mucous membranes, no thyromegaly, no cervical lymphadenopathy Cardiovascular: tachycardia, RR, No MRG Respiratory: CTA B Gastrointestinal: abdomen soft, NT, ND, BS+ Musculoskeletal: no deformities, strength intact in all 4 Skin: moist, warm, no rashes  Assessment:     1. DM2, uncontrolled, insulin-dependent, with probable complications - ? peripheral neuropathy  Plan:     Patient with previously uncontrolled diabetes, however improved after addition of insulin at the end of last year.   - since she stopped Bydureon, her sugars have increased in a.m., and most likely also in the  afternoon, although she does not check sugars later in the day - She had nausea with the med, and I suspect that her headaches were also exacerbated by this, so I advised her to stay off it - She might be able to come off her headache medicines in the near future. If headaches resolve, she might be able to get back to take the INH needed for her positive PPD - for now, I advised her to increase Lantus to 18 units - continue metformin  XR at 500 mg twice a day.  - restart Januvia for postprandial coverage at 100 mg in a.m. - sent her prescription to her pharmacy for Januvia - continue to check sugars 1-2x a day - rotate checks, I advised her to try to check sugars later in the day too - would check a hemoglobin A1c today - I will see her back in 3 months with her sugar log  Office Visit on 01/11/2013  Component Date Value Range Status  . Hemoglobin A1C 01/11/2013 8.4* 4.6 - 6.5 % Final   Glycemic Control Guidelines for People with Diabetes:Non Diabetic:  <6%Goal of Therapy: <7%Additional Action Suggested:  >8%

## 2013-01-11 NOTE — Patient Instructions (Addendum)
Please stay off the Bydureon. See if Headaches improve on it. Increase Lantus at 18 units. Start back on Januvia 100 mg daily. Please stop at the lab for a hbA1c.

## 2013-01-25 ENCOUNTER — Encounter: Payer: 59 | Admitting: Neurology

## 2013-01-25 NOTE — Progress Notes (Deleted)
[facility]  Provider:  Dr Hosie Poisson Referring Provider: Corwin Levins, MD Primary Care Physician:  Oliver Barre, MD  CC:  headache  HPI:  Rachel [name] is a 47 y.o. female here as a referral from Dr. Jonny Ruiz for evaluation of headache First Headache:  Current Headache:  Aura/Prodrome:  Triggers:  Current frequency:  Current meds for Headache:  Meds in past for Headache:  Mood:  Sleep:  Family Hx HA:  No. days last 3 months miss work or school due to HA:  No. days last 3 months productivity at work/school reduced by half due to HA:  No. days last 3 months you did not do household work because of HA:  No. days last 3 months productivity in household work reduced by half due to HA:  No. days in last 3 months missed social activities because of HA:  Review of Systems: Out of a complete 14 system review, the patient complains of only the following symptoms, and all other reviewed systems are negative. ***  History   Social History  . Marital Status: Married    Spouse Name: N/A    Number of Children: 3  . Years of Education: N/A   Occupational History  . NURSE [employer]    Social History Main Topics  . Smoking status: Never Smoker   . Smokeless tobacco: Never Used  . Alcohol Use: No  . Drug Use: No  . Sexual Activity: Yes   Other Topics Concern  . Not on file   Social History Narrative   She has 3 daughters, 16, 70, 57 yo lives with her.    Family History  Problem Relation Age of Onset  . Hypertension Mother   . Hypertension Father   . Diabetes Father   . Asthma Father   . Cancer Other     cervical cancer  . Anesthesia problems Neg Hx     Past Medical History  Diagnosis Date  . DIABETES MELLITUS, TYPE II 08/02/2007  . HYPERLIPIDEMIA 08/02/2007  . COMMON MIGRAINE 05/15/2009  . ALLERGIC RHINITIS 10/04/2007  . GERD 08/02/2007  . INTERMITTENT VERTIGO 05/15/2009  . INSOMNIA-SLEEP DISORDER-UNSPEC 01/04/2008  . LIBIDO, DECREASED 01/09/2010  .  Anemia 01/21/2011  . ASTHMA 08/02/2007    INHALERS ONLY IN [place]    Past Surgical History  Procedure Laterality Date  . Cesarean section    . S/p left knee surgury    . Parotidectomy  10/21/2011  . Parotidectomy  10/21/2011    Procedure: PAROTIDECTOMY;  Surgeon: Christia Reading, MD;  Location: [facility] OR;  Service: ENT;  Laterality: Left;    Current Outpatient Prescriptions  Medication Sig Dispense Refill  . aspirin 81 MG EC tablet Take 81 mg by mouth daily.        . butalbital-acetaminophen-caffeine (FIORICET) 50-325-40 MG per tablet Take 1 tablet by mouth every 6 (six) hours as needed for headache. With limit 1- 2 per day  30 tablet  0  . esomeprazole (NEXIUM) 40 MG capsule Take 1 capsule (40 mg total) by mouth daily.  90 capsule  3  . glucose blood (TRUETEST TEST) test strip Use as directed once daily.  Diagnosis code 250.02  100 each  12  . insulin glargine (LANTUS SOLOSTAR) 100 UNIT/ML injection Inject 30 Units into the skin daily.  15 mL  12  . Insulin Pen Needle (BD PEN NEEDLE NANO U/F) 32G X 4 MM MISC 1 application by Does not apply route daily.  100 each  12  .  Lancets MISC 1 application by Does not apply route daily.  100 each  12  . metFORMIN (GLUCOPHAGE XR) 500 MG 24 hr tablet Take 2 tablets (1,000 mg total) by mouth 2 (two) times daily with a meal.  120 tablet  3  . simvastatin (ZOCOR) 40 MG tablet Take 1 tablet (40 mg total) by mouth every evening.  90 tablet  3  . sitaGLIPtin (JANUVIA) 100 MG tablet Take 1 tablet (100 mg total) by mouth daily.  90 tablet  3  . topiramate (TOPAMAX) 50 MG tablet Take 1 tablet (50 mg total) by mouth 2 (two) times daily.  60 tablet  5   No current facility-administered medications for this visit.    Allergies as of 01/25/2013 - Review Complete 01/11/2013  Allergen Reaction Noted  . Atorvastatin  07/17/2010  . Nitroglycerin  10/16/2010    Vitals: There were no vitals taken for this visit. Last Weight:  Wt Readings from Last 1 Encounters:   01/11/13 153 lb (69.4 kg)   Last Height:   Ht Readings from Last 1 Encounters:  08/24/12 5\' 1"  (1.549 m)     Physical exam: Exam: Gen: NAD, conversant Eyes: anicteric sclerae, moist conjunctivae HENT: [place]raumati Neck: Trachea midline; supple,  Lungs: CTA, no wheezing, rales, rhonic                          CV: RRR, no MRG Abdomen: Soft, non-tender;  Extremities: No peripheral edema  Skin: Normal temperature, no rash,  Psych: Appropriate affect, pleasant  Neuro: MS: AA&Ox3, appropriately interactive, normal affect   Attention: WORLD backwards  Speech: fluent w/o paraphasic error  Memory: good recent and remote recall  CN: PERRL, EOMI no nystagmus, no ptosis, sensation intact to LT V1-V3 bilat, face symmetric, no weakness, hearing grossly intact, palate elevates symmetrically, shoulder shrug 5/5 bilat,  tongue protrudes midline, no fasiculations noted.  Motor: normal bulk and tone Strength: 5/5  In all extremities  Coord: rapid alternating and point-to-point (FNF, HTS) movements intact.  Reflexes: symmetrical, bilat downgoing toes  Sens: LT intact in all extremities  Gait: posture, stance, stride and arm-swing normal. Tandem gait intact. Able to walk on heels and toes. Romberg absent.   Assessment:  After physical and neurologic examination, review of laboratory studies, imaging, neurophysiology testing and pre-existing records, assessment will be reviewed on the problem list.  Plan:  Treatment plan and additional workup will be reviewed under Problem List.

## 2013-01-26 NOTE — Progress Notes (Signed)
This encounter was created in error - please disregard.

## 2013-02-13 ENCOUNTER — Ambulatory Visit (INDEPENDENT_AMBULATORY_CARE_PROVIDER_SITE_OTHER): Payer: 59 | Admitting: Neurology

## 2013-02-13 ENCOUNTER — Encounter: Payer: Self-pay | Admitting: Neurology

## 2013-02-13 ENCOUNTER — Telehealth: Payer: Self-pay | Admitting: Neurology

## 2013-02-13 VITALS — BP 108/75 | HR 99 | Ht 62.0 in | Wt 149.0 lb

## 2013-02-13 DIAGNOSIS — R51 Headache: Secondary | ICD-10-CM

## 2013-02-13 MED ORDER — DICLOFENAC POTASSIUM(MIGRAINE) 50 MG PO PACK: 50.0000 mg | PACK | Freq: Once | ORAL | Status: DC | PRN

## 2013-02-13 NOTE — Telephone Encounter (Signed)
Rx updated and re-sent.

## 2013-02-13 NOTE — Telephone Encounter (Signed)
Lets just change it to the 9 packets for now since it is to be used prn. Thanks.

## 2013-02-13 NOTE — Progress Notes (Signed)
[facility]  Provider:  Dr Hosie Poisson Referring Provider: Corwin Levins, MD Primary Care Physician:  Oliver Barre, MD  CC:  headache  HPI:  Rachel [name] is a 47 y.o. female here as a referral from Dr. Jonny Ruiz for headache  Initially developed headaches years ago, now increasing in frequency. Reports for the past few months she has had a chronic daily headache, with around 1-2 exacerbations of severe headache. There chronic daily headache is described as an occipital radiating anterior squeezing L. pain. Denies any nausea or vomiting, no photo phonophobia associated with these headaches. These occur on a near daily basis. Also has around one to month exacerbations of severe headache. These again are noted to the occipital radiating anterior, but described as pulsating pounding pain, can be up to 8/10. With these note some nausea, photo and phonophobia. Also get dizzy and lightheaded. Blurry vision noted with the headache. No visual aura, notes worsening stress pain in her shoulder region prior to migraine onset. Feels they're triggered by fatigue. He wakes up frequently over night, snoring in her sleep, unclear if any apnea events. She wakes up feeling fatigue and fatigue throughout the day. Denies any anxiety or depression. Daughter has migraines, otherwise no family history of migraines.   Current meds for Headache: fioricet and topamax. Started fioricet last month, takes 3 to 4 tablets per week, notes some benefit with this medication. Taking topamax for 3 months, currently only taking 1 tablet (50mg  at bedtime). Was instructed to take this medication twice a day, has not tried this for unclear reasons. Denies any adverse effects and Topamax tolerating well. Tried OTC medications in the past but had medication overuse headache so stopped.   Review of Systems: Out of a complete 14 system review, the patient complains of only the following symptoms, and all other reviewed systems are  negative. + for Blurred vision anemia easy bruising lymph nodes headache dizziness feeling hot snoring none of sleep decreased energy  History   Social History  . Marital Status: Married    Spouse Name: N/A    Number of Children: 3  . Years of Education: N/A   Occupational History  . NURSE [employer]    Social History Main Topics  . Smoking status: Never Smoker   . Smokeless tobacco: Never Used  . Alcohol Use: No  . Drug Use: No  . Sexual Activity: Yes   Other Topics Concern  . Not on file   Social History Narrative   She has 3 daughters, 86, 28, 61 yo lives with her.    Family History  Problem Relation Age of Onset  . Hypertension Mother   . Hypertension Father   . Diabetes Father   . Asthma Father   . Cancer Other     cervical cancer  . Anesthesia problems Neg Hx     Past Medical History  Diagnosis Date  . DIABETES MELLITUS, TYPE II 08/02/2007  . HYPERLIPIDEMIA 08/02/2007  . COMMON MIGRAINE 05/15/2009  . ALLERGIC RHINITIS 10/04/2007  . GERD 08/02/2007  . INTERMITTENT VERTIGO 05/15/2009  . INSOMNIA-SLEEP DISORDER-UNSPEC 01/04/2008  . LIBIDO, DECREASED 01/09/2010  . Anemia 01/21/2011  . ASTHMA 08/02/2007    INHALERS ONLY IN [place]    Past Surgical History  Procedure Laterality Date  . Cesarean section    . S/p left knee surgury    . Parotidectomy  10/21/2011  . Parotidectomy  10/21/2011    Procedure: PAROTIDECTOMY;  Surgeon: Christia Reading, MD;  Location: [facility] OR;  Service: ENT;  Laterality: Left;    Current Outpatient Prescriptions  Medication Sig Dispense Refill  . aspirin 81 MG EC tablet Take 81 mg by mouth daily.        . butalbital-acetaminophen-caffeine (FIORICET) 50-325-40 MG per tablet Take 1 tablet by mouth every 6 (six) hours as needed for headache. With limit 1- 2 per day  30 tablet  0  . esomeprazole (NEXIUM) 40 MG capsule Take 1 capsule (40 mg total) by mouth daily.  90 capsule  3  . glucose blood (TRUETEST TEST) test strip Use as directed once daily.   Diagnosis code 250.02  100 each  12  . insulin glargine (LANTUS SOLOSTAR) 100 UNIT/ML injection Inject 30 Units into the skin daily.  15 mL  12  . Insulin Pen Needle (BD PEN NEEDLE NANO U/F) 32G X 4 MM MISC 1 application by Does not apply route daily.  100 each  12  . Lancets MISC 1 application by Does not apply route daily.  100 each  12  . metFORMIN (GLUCOPHAGE XR) 500 MG 24 hr tablet Take 2 tablets (1,000 mg total) by mouth 2 (two) times daily with a meal.  120 tablet  3  . simvastatin (ZOCOR) 40 MG tablet Take 1 tablet (40 mg total) by mouth every evening.  90 tablet  3  . sitaGLIPtin (JANUVIA) 100 MG tablet Take 1 tablet (100 mg total) by mouth daily.  90 tablet  3  . topiramate (TOPAMAX) 50 MG tablet Take 1 tablet (50 mg total) by mouth 2 (two) times daily.  60 tablet  5   No current facility-administered medications for this visit.    Allergies as of 02/13/2013 - Review Complete 01/11/2013  Allergen Reaction Noted  . Atorvastatin  07/17/2010  . Nitroglycerin  10/16/2010    Vitals: There were no vitals taken for this visit. Last Weight:  Wt Readings from Last 1 Encounters:  01/11/13 153 lb (69.4 kg)   Last Height:   Ht Readings from Last 1 Encounters:  08/24/12 5\' 1"  (1.549 m)     Physical exam: Exam: Gen: NAD, conversant Eyes: anicteric sclerae, moist conjunctivae HENT: Atraumatic Neck: Trachea midline; supple,  Lungs: CTA, no wheezing, rales, rhonic                          CV: RRR, no MRG Abdomen: Soft, non-tender;  Extremities: No peripheral edema  Skin: Normal temperature, no rash,  Psych: Appropriate affect, pleasant  Neuro: MS: AA&Ox3, appropriately interactive, normal affect   Speech: fluent w/o paraphasic error  Memory: good recent and remote recall  CN: PERRL, EOMI no nystagmus, visual fields full to finger count bilaterally, funduscopic exam unremarkable, no ptosis, sensation intact to LT V1-V3 bilat, face symmetric, no weakness, hearing grossly  intact, palate elevates symmetrically, shoulder shrug 5/5 bilat,  tongue protrudes midline, no fasiculations noted.  Motor: normal bulk and tone Strength: 5/5  In all extremities  Coord: rapid alternating and point-to-point (FNF, HTS) movements intact.  Reflexes: symmetrical, bilat downgoing toes  Sens: LT intact in all extremities  Gait: posture, stance, stride and arm-swing normal. Tandem gait intact. Able to walk on heels and toes. Romberg absent.   Assessment:  After physical and neurologic examination, review of laboratory studies, imaging, neurophysiology testing and pre-existing records, assessment will be reviewed on the problem list.  Plan:  Treatment plan and additional workup will be reviewed under Problem List.  Ms Rachel [name] is a pleasant 47 year old  woman who presents for initial evaluation of headache. Recently she has noted a near daily chronic headache with multiple exacerbations per month of migrainous type headache. Physical exam is unremarkable. She is currently taking Fioricet as needed and Topamax 50 mg daily for her headaches. Based on clinical history she appears to have a chronic daily headache, which appears to be tension type in nature, and a intermittent migraine exacerbation. Consultation on different therapeutic options for her headache management. Discussed importance of increasing the dose of Topamax as tolerated to get full there appeared benefit. Will increase Topamax to 50 mg twice a day as was instructed. Consultation that continued use of Fioricet connected trigger worsening headache, will discontinue Fioricet and switch to [facility] when necessary. Consultation on importance of using abortive medications on an infrequent basis only when having severe exacerbations. In the future if no improvement with headache would consider possible obstructive sleep apnea as triggering factor of headaches, as patient notes snoring and daytime somnolence.  1)headache: appears to  have chronic daily tension type headache and migraine exacerbations 2)Fatigue: consider OSA  -increase topamax to 50mg  bid. Would titrate up to therapeutic dose before consider it a medication failure -discontinue fioricet -start Cambia prn -consider sleep study in the future if no headache improvement to r/o OSA -follow up in 3 to 4 months

## 2013-02-13 NOTE — Telephone Encounter (Signed)
Insurance does not allow Cambia Rx for daily use (30 packs for 30 days).  The max allowed is #9 per month, if they approve out prior auth request.  Otherwise, they cover Diclofenac tabs for daily use.  Dr Hosie Poisson, would you like to change the Rx to #9, (I can send the ins a PA request to try and get this amount approved) or change Rx to tablets instead?  Please advise.  Thank you.

## 2013-02-13 NOTE — Patient Instructions (Addendum)
Overall you are doing fairly well but I do want to suggest a few things today:   Remember to drink plenty of fluid, eat healthy meals and do not skip any meals. Try to eat protein with a every meal and eat a healthy snack such as fruit or nuts in between meals. Try to keep a regular sleep-wake schedule and try to exercise daily, particularly in the form of walking, 20-30 minutes a day, if you can.   As far as your medications are concerned, I would like to suggest 3 changes: 1)Please increase the topamax to 50mg  twice a day 2)Discontinue the Fioricet 3)Start Cambia, one packet as needed for severe headaches. Only use this for bad breakthrough headaches  I would like to see you back in 4 months, sooner if we need to. Please call us with any interim questions, concerns, problems, updates or refill requests.   Please also call us for any test results so we can go over those with you on the phone.  My clinical assistant and will answer any of your questions and relay your messages to me and also relay most of my messages to you.   Our phone number is [phone]. We also have an after hours call service for urgent matters and there is a physician on-call for urgent questions. For any emergencies you know to call 911 or go to the nearest emergency room

## 2013-03-29 ENCOUNTER — Other Ambulatory Visit: Payer: Self-pay | Admitting: Internal Medicine

## 2013-04-03 ENCOUNTER — Other Ambulatory Visit: Payer: Self-pay

## 2013-04-03 DIAGNOSIS — Z1231 Encounter for screening mammogram for malignant neoplasm of breast: Secondary | ICD-10-CM

## 2013-04-09 ENCOUNTER — Ambulatory Visit: Payer: PRIVATE HEALTH INSURANCE | Attending: Family Medicine | Admitting: Physical Therapy

## 2013-04-09 DIAGNOSIS — M545 Low back pain, unspecified: Secondary | ICD-10-CM | POA: Insufficient documentation

## 2013-04-09 DIAGNOSIS — IMO0001 Reserved for inherently not codable concepts without codable children: Secondary | ICD-10-CM | POA: Insufficient documentation

## 2013-04-09 DIAGNOSIS — R293 Abnormal posture: Secondary | ICD-10-CM | POA: Insufficient documentation

## 2013-04-09 DIAGNOSIS — M542 Cervicalgia: Secondary | ICD-10-CM | POA: Insufficient documentation

## 2013-04-11 ENCOUNTER — Ambulatory Visit: Payer: PRIVATE HEALTH INSURANCE | Admitting: Physical Therapy

## 2013-04-17 ENCOUNTER — Ambulatory Visit: Payer: Self-pay | Admitting: Internal Medicine

## 2013-04-17 DIAGNOSIS — Z0289 Encounter for other administrative examinations: Secondary | ICD-10-CM

## 2013-04-19 ENCOUNTER — Other Ambulatory Visit: Payer: Self-pay

## 2013-04-19 ENCOUNTER — Ambulatory Visit: Payer: PRIVATE HEALTH INSURANCE | Attending: Internal Medicine | Admitting: Physical Therapy

## 2013-04-19 DIAGNOSIS — M545 Low back pain, unspecified: Secondary | ICD-10-CM | POA: Insufficient documentation

## 2013-04-19 DIAGNOSIS — R293 Abnormal posture: Secondary | ICD-10-CM | POA: Insufficient documentation

## 2013-04-19 DIAGNOSIS — M542 Cervicalgia: Secondary | ICD-10-CM | POA: Insufficient documentation

## 2013-04-19 DIAGNOSIS — IMO0001 Reserved for inherently not codable concepts without codable children: Secondary | ICD-10-CM | POA: Insufficient documentation

## 2013-04-26 ENCOUNTER — Ambulatory Visit: Payer: 59 | Attending: Internal Medicine | Admitting: Rehabilitation

## 2013-04-26 DIAGNOSIS — M545 Low back pain, unspecified: Secondary | ICD-10-CM | POA: Insufficient documentation

## 2013-04-26 DIAGNOSIS — IMO0001 Reserved for inherently not codable concepts without codable children: Secondary | ICD-10-CM | POA: Insufficient documentation

## 2013-04-26 DIAGNOSIS — M542 Cervicalgia: Secondary | ICD-10-CM | POA: Insufficient documentation

## 2013-04-27 ENCOUNTER — Ambulatory Visit: Payer: 59 | Admitting: Physical Therapy

## 2013-04-30 ENCOUNTER — Ambulatory Visit: Payer: 59 | Admitting: Rehabilitation

## 2013-04-30 ENCOUNTER — Ambulatory Visit
Admission: RE | Admit: 2013-04-30 | Discharge: 2013-04-30 | Disposition: A | Discharge status: Home / Self Care | Payer: 59 | Source: Ambulatory Visit

## 2013-04-30 DIAGNOSIS — Z1231 Encounter for screening mammogram for malignant neoplasm of breast: Secondary | ICD-10-CM

## 2013-05-01 ENCOUNTER — Ambulatory Visit: Payer: 59 | Admitting: Physical Therapy

## 2013-05-02 ENCOUNTER — Other Ambulatory Visit: Payer: Self-pay | Admitting: Internal Medicine

## 2013-05-02 DIAGNOSIS — N63 Unspecified lump in unspecified breast: Secondary | ICD-10-CM

## 2013-05-03 DIAGNOSIS — Z0279 Encounter for issue of other medical certificate: Secondary | ICD-10-CM

## 2013-05-07 ENCOUNTER — Ambulatory Visit: Payer: 59 | Admitting: Rehabilitation

## 2013-05-09 ENCOUNTER — Ambulatory Visit: Payer: 59 | Admitting: Physical Therapy

## 2013-05-14 ENCOUNTER — Ambulatory Visit: Payer: PRIVATE HEALTH INSURANCE | Attending: Internal Medicine | Admitting: Rehabilitation

## 2013-05-14 DIAGNOSIS — M545 Low back pain, unspecified: Secondary | ICD-10-CM | POA: Insufficient documentation

## 2013-05-14 DIAGNOSIS — M542 Cervicalgia: Secondary | ICD-10-CM | POA: Insufficient documentation

## 2013-05-14 DIAGNOSIS — IMO0001 Reserved for inherently not codable concepts without codable children: Secondary | ICD-10-CM | POA: Diagnosis present

## 2013-05-15 ENCOUNTER — Encounter: Payer: Self-pay | Admitting: Physical Therapy

## 2013-05-21 ENCOUNTER — Encounter: Payer: Self-pay | Admitting: Physical Therapy

## 2013-05-22 ENCOUNTER — Encounter: Payer: Self-pay | Admitting: Physical Therapy

## 2013-05-23 ENCOUNTER — Ambulatory Visit
Admission: RE | Admit: 2013-05-23 | Discharge: 2013-05-23 | Disposition: A | Discharge status: Home / Self Care | Payer: 59 | Source: Ambulatory Visit | Attending: Internal Medicine | Admitting: Internal Medicine

## 2013-05-23 ENCOUNTER — Other Ambulatory Visit: Payer: Self-pay

## 2013-05-23 DIAGNOSIS — N63 Unspecified lump in unspecified breast: Secondary | ICD-10-CM

## 2013-05-23 LAB — HM MAMMOGRAPHY

## 2013-06-19 ENCOUNTER — Ambulatory Visit: Payer: 59 | Admitting: Neurology

## 2013-06-21 ENCOUNTER — Ambulatory Visit: Payer: 59 | Admitting: Internal Medicine

## 2013-06-21 DIAGNOSIS — Z0289 Encounter for other administrative examinations: Secondary | ICD-10-CM

## 2013-06-25 ENCOUNTER — Other Ambulatory Visit: Payer: Self-pay | Admitting: Internal Medicine

## 2013-06-25 NOTE — Telephone Encounter (Signed)
Refill done.  

## 2013-06-26 NOTE — Telephone Encounter (Signed)
Faxed hardcopy of Fioricet to [facility]

## 2013-09-24 ENCOUNTER — Other Ambulatory Visit: Payer: Self-pay | Admitting: Internal Medicine

## 2013-09-26 ENCOUNTER — Ambulatory Visit: Payer: Self-pay | Admitting: Internal Medicine

## 2013-10-04 ENCOUNTER — Other Ambulatory Visit (INDEPENDENT_AMBULATORY_CARE_PROVIDER_SITE_OTHER): Payer: 59

## 2013-10-04 ENCOUNTER — Encounter: Payer: Self-pay | Admitting: Internal Medicine

## 2013-10-04 ENCOUNTER — Ambulatory Visit (INDEPENDENT_AMBULATORY_CARE_PROVIDER_SITE_OTHER): Payer: 59 | Admitting: Internal Medicine

## 2013-10-04 VITALS — BP 112/80 | HR 97 | Temp 98.4°F | Wt 139.2 lb

## 2013-10-04 DIAGNOSIS — E119 Type 2 diabetes mellitus without complications: Secondary | ICD-10-CM

## 2013-10-04 DIAGNOSIS — R51 Headache: Secondary | ICD-10-CM

## 2013-10-04 DIAGNOSIS — Z0000 Encounter for general adult medical examination without abnormal findings: Secondary | ICD-10-CM

## 2013-10-04 LAB — HEPATIC FUNCTION PANEL
ALT: 21 U/L (ref 0–35)
AST: 16 U/L (ref 0–37)
Albumin: 4.4 g/dL (ref 3.5–5.2)
Alkaline Phosphatase: 71 U/L (ref 39–117)
Bilirubin, Direct: 0.1 mg/dL (ref 0.0–0.3)
Total Bilirubin: 1.1 mg/dL (ref 0.3–1.2)
Total Protein: 7.4 g/dL (ref 6.0–8.3)

## 2013-10-04 LAB — CBC WITH DIFFERENTIAL/PLATELET
Basophils Absolute: 0.2 10*3/uL — ABNORMAL HIGH (ref 0.0–0.1)
Basophils Relative: 2.1 % (ref 0.0–3.0)
Eosinophils Absolute: 0.1 10*3/uL (ref 0.0–0.7)
Eosinophils Relative: 1.4 % (ref 0.0–5.0)
HCT: 40.2 % (ref 36.0–46.0)
Hemoglobin: 13.1 g/dL (ref 12.0–15.0)
Lymphocytes Relative: 41 % (ref 12.0–46.0)
Lymphs Abs: 4.2 10*3/uL — ABNORMAL HIGH (ref 0.7–4.0)
MCHC: 32.5 g/dL (ref 30.0–36.0)
MCV: 78.7 fl (ref 78.0–100.0)
Monocytes Absolute: 0.5 10*3/uL (ref 0.1–1.0)
Monocytes Relative: 4.4 % (ref 3.0–12.0)
Neutro Abs: 5.2 10*3/uL (ref 1.4–7.7)
Neutrophils Relative %: 51.1 % (ref 43.0–77.0)
Platelets: 363 10*3/uL (ref 150.0–400.0)
RBC: 5.11 Mil/uL (ref 3.87–5.11)
RDW: 14.8 % — ABNORMAL HIGH (ref 11.5–14.6)
WBC: 10.3 10*3/uL (ref 4.5–10.5)

## 2013-10-04 LAB — BASIC METABOLIC PANEL
BUN: 16 mg/dL (ref 6–23)
CO2: 26 mEq/L (ref 19–32)
Calcium: 9.7 mg/dL (ref 8.4–10.5)
Chloride: 96 mEq/L (ref 96–112)
Creatinine, Ser: 0.7 mg/dL (ref 0.4–1.2)
GFR: 97.59 mL/min (ref >60.00)
Glucose, Bld: 488 mg/dL — ABNORMAL HIGH (ref 70–99)
Potassium: 4 mEq/L (ref 3.5–5.1)
Sodium: 131 mEq/L — ABNORMAL LOW (ref 135–145)

## 2013-10-04 LAB — URINALYSIS, ROUTINE W REFLEX MICROSCOPIC
Bilirubin Urine: NEGATIVE
Hgb urine dipstick: NEGATIVE
Leukocytes, UA: NEGATIVE
Nitrite: NEGATIVE
Specific Gravity, Urine: <=1.005 — AB (ref 1.000–1.030)
Total Protein, Urine: NEGATIVE
Urine Glucose: >=1000 — AB
Urobilinogen, UA: 0.2 (ref 0.0–1.0)
pH: 6 (ref 5.0–8.0)

## 2013-10-04 LAB — LIPID PANEL
Cholesterol: 285 mg/dL — ABNORMAL HIGH (ref 0–200)
HDL: 47.7 mg/dL (ref >39.00)
LDL Cholesterol: 183 mg/dL — ABNORMAL HIGH (ref 0–99)
Total CHOL/HDL Ratio: 6
Triglycerides: 270 mg/dL — ABNORMAL HIGH (ref 0.0–149.0)
VLDL: 54 mg/dL — ABNORMAL HIGH (ref 0.0–40.0)

## 2013-10-04 LAB — TSH: TSH: 0.52 u[IU]/mL (ref 0.35–5.50)

## 2013-10-04 LAB — MICROALBUMIN / CREATININE URINE RATIO
Creatinine,U: 22.7 mg/dL
Microalb Creat Ratio: 4.9 mg/g (ref 0.0–30.0)
Microalb, Ur: 1.1 mg/dL (ref 0.0–1.9)

## 2013-10-04 LAB — HEMOGLOBIN A1C: Hgb A1c MFr Bld: 15.1 % — ABNORMAL HIGH (ref 4.6–6.5)

## 2013-10-04 MED ORDER — PANTOPRAZOLE SODIUM 40 MG PO TBEC: DELAYED_RELEASE_TABLET | ORAL | Status: DC

## 2013-10-04 MED ORDER — TOPIRAMATE 50 MG PO TABS: 50.0000 mg | ORAL_TABLET | Freq: Two times a day (BID) | ORAL | Status: DC

## 2013-10-04 MED ORDER — DICLOFENAC POTASSIUM(MIGRAINE) 50 MG PO PACK: 50.0000 mg | PACK | Freq: Once | ORAL | Status: DC | PRN

## 2013-10-04 MED ORDER — ESOMEPRAZOLE MAGNESIUM 40 MG PO CPDR: 40.0000 mg | DELAYED_RELEASE_CAPSULE | Freq: Every day | ORAL | Status: DC

## 2013-10-04 MED ORDER — INSULIN GLARGINE 100 UNIT/ML ~~LOC~~ SOLN: 30.0000 [IU] | Freq: Every day | SUBCUTANEOUS | Status: DC

## 2013-10-04 MED ORDER — SITAGLIPTIN PHOSPHATE 100 MG PO TABS: 100.0000 mg | ORAL_TABLET | Freq: Every day | ORAL | Status: DC

## 2013-10-04 MED ORDER — METFORMIN HCL ER 500 MG PO TB24: 1000.0000 mg | ORAL_TABLET | Freq: Two times a day (BID) | ORAL | Status: DC

## 2013-10-04 MED ORDER — SIMVASTATIN 40 MG PO TABS: ORAL_TABLET | ORAL | Status: DC

## 2013-10-04 MED ORDER — BUTALBITAL-APAP-CAFFEINE 50-325-40 MG PO TABS: ORAL_TABLET | ORAL | Status: DC

## 2013-10-04 NOTE — Progress Notes (Signed)
Pre visit review using our clinic review tool, if applicable. No additional management support is needed unless otherwise documented below in the visit note. 

## 2013-10-04 NOTE — Assessment & Plan Note (Signed)
With wt loss and dry mouth, suspect uncontrolled DM, for a1c

## 2013-10-04 NOTE — Progress Notes (Signed)
Subjective:    Patient ID: Rachel [name], female    DOB: 03-Dec-1965, 48 y.o.   MRN: [id number]  HPI  Here for wellness and f/u;  Overall doing ok;  Pt denies CP, worsening SOB, DOE, wheezing, orthopnea, PND, worsening LE edema, palpitations, dizziness or syncope.  Pt denies neurological change such as new headache, facial or extremity weakness.  Pt denies polydipsia, polyuria, or low sugar symptoms. Pt states overall good compliance with treatment and medications, good tolerability, and has been trying to follow lower cholesterol diet.  Pt denies worsening depressive symptoms, suicidal ideation or panic. No fever, night sweats, wt loss, loss of appetite, or other constitutional symptoms.  Pt states good ability with ADL's, has low fall risk, home safety reviewed and adequate, no other significant changes in hearing or vision, and only occasionally active with exercise.  Has lost wt i? ntentionally from 157 to current 139 - lost 27 lbs since last visit, she states intentionally, though also with marked dry mouth Past Medical History  Diagnosis Date  . DIABETES MELLITUS, TYPE II 08/02/2007  . HYPERLIPIDEMIA 08/02/2007  . COMMON MIGRAINE 05/15/2009  . ALLERGIC RHINITIS 10/04/2007  . GERD 08/02/2007  . INTERMITTENT VERTIGO 05/15/2009  . INSOMNIA-SLEEP DISORDER-UNSPEC 01/04/2008  . LIBIDO, DECREASED 01/09/2010  . Anemia 01/21/2011  . ASTHMA 08/02/2007    INHALERS ONLY IN [place]   Past Surgical History  Procedure Laterality Date  . Cesarean section    . S/p left knee surgury    . Parotidectomy  10/21/2011  . Parotidectomy  10/21/2011    Procedure: PAROTIDECTOMY;  Surgeon: Melida Quitter, MD;  Location: [place];  Service: ENT;  Laterality: Left;    reports that she has never smoked. She has never used smokeless tobacco. She reports that she does not drink alcohol or use illicit drugs. family history includes Asthma in her father; Cancer in her other; Diabetes in her father; Hypertension in her father and mother.  There is no history of Anesthesia problems. Allergies  Allergen Reactions  . Atorvastatin     REACTION: myalgias  . Nitroglycerin    Current Outpatient Prescriptions on File Prior to Visit  Medication Sig Dispense Refill  . aspirin 81 MG EC tablet Take 81 mg by mouth daily.        Marland Kitchen glucose blood (TRUETEST TEST) test strip Use as directed once daily.  Diagnosis code 250.02  100 each  12  . Insulin Pen Needle (BD PEN NEEDLE NANO U/F) 32G X 4 MM MISC 1 application by Does not apply route daily.  100 each  12  . SM LANCETS 33G MISC USE AS DIRECTED  100 each  3   No current facility-administered medications on file prior to visit.    Review of Systems Constitutional: Negative for increased diaphoresis, other activity, appetite or other siginficant weight change  HENT: Negative for worsening hearing loss, ear pain, facial swelling, mouth sores and neck stiffness.   Eyes: Negative for other worsening pain, redness or visual disturbance.  Respiratory: Negative for shortness of breath and wheezing.   Cardiovascular: Negative for chest pain and palpitations.  Gastrointestinal: Negative for diarrhea, blood in stool, abdominal distention or other pain Genitourinary: Negative for hematuria, flank pain or change in urine volume.  Musculoskeletal: Negative for myalgias or other joint complaints.  Skin: Negative for color change and wound.  Neurological: Negative for syncope and numbness. other than noted Hematological: Negative for adenopathy. or other swelling Psychiatric/Behavioral: Negative for hallucinations, self-injury, decreased concentration or  other worsening agitation.      Objective:   Physical Exam BP 112/80  Pulse 97  Temp(Src) 98.4 F (36.9 C) (Oral)  Wt 139 lb 4 oz (63.163 kg)  SpO2 98% VS noted,  Constitutional: Pt is oriented to person, place, and time. Appears well-developed and well-nourished.  Head: Normocephalic and atraumatic.  Right Ear: External ear normal.  Left  Ear: External ear normal.  Nose: Nose normal.  Mouth/Throat: Oropharynx is clear and moist.  Eyes: Conjunctivae and EOM are normal. Pupils are equal, round, and reactive to light.  Neck: Normal range of motion. Neck supple. No JVD present. No tracheal deviation present.  Cardiovascular: Normal rate, regular rhythm, normal heart sounds and intact distal pulses.   Pulmonary/Chest: Effort normal and breath sounds without rales or wheezing  Abdominal: Soft. Bowel sounds are normal. NT. No HSM  Musculoskeletal: Normal range of motion. Exhibits no edema.  Lymphadenopathy:  Has no cervical adenopathy.  Neurological: Pt is alert and oriented to person, place, and time. Pt has normal reflexes. No cranial nerve deficit. Motor grossly intact Skin: Skin is warm and dry. No rash noted.  Psychiatric:  Has normal mood and affect. Behavior is normal.     Assessment & Plan:

## 2013-10-04 NOTE — Assessment & Plan Note (Signed)

## 2013-10-04 NOTE — Patient Instructions (Signed)
Please continue all other medications as before, and refills have been done if requested. Please have the pharmacy call with any other refills you may need.  Please continue your efforts at being more active, low cholesterol diet, and weight control.  You are otherwise up to date with prevention measures today.  Please go to the LAB in the Basement (turn left off the elevator) for the tests to be done today You will be contacted by phone if any changes need to be made immediately.  Otherwise, you will receive a letter about your results with an explanation, but please check with MyChart first.  Please remember to sign up for MyChart if you have not done so, as this will be important to you in the future with finding out test results, communicating by private email, and scheduling acute appointments online when needed.  Please return in 6 months, or sooner if needed, with Lab testing done 3-5 days before  

## 2013-10-09 ENCOUNTER — Other Ambulatory Visit: Payer: Self-pay

## 2013-10-09 MED ORDER — INSULIN PEN NEEDLE 32G X 4 MM MISC: Status: DC

## 2013-10-10 ENCOUNTER — Encounter: Payer: Self-pay | Admitting: Internal Medicine

## 2013-10-12 ENCOUNTER — Telehealth: Payer: Self-pay

## 2013-10-12 NOTE — Telephone Encounter (Signed)
Relevant patient education assigned to patient using Emmi. ° °

## 2013-12-07 ENCOUNTER — Telehealth: Payer: Self-pay | Admitting: Internal Medicine

## 2013-12-07 NOTE — Telephone Encounter (Signed)
Called the home number and has been disconnected.  Left message on cell number to call our office.

## 2013-12-07 NOTE — Telephone Encounter (Signed)
[facility] nurse reporting Rachel [name] POC A1C was 12.4 on 11-29-2013.  She is scheduled for major dental surgery.  Nurse states that there are cultural issues and that Darshay will only take Metformin and Lantis.  She did not like Dr. Cruzita Lederer.  Nurse states that this information was sent to Dr. Jenny Reichmann via a faxed note.  She just wanted to follow-up with a phone call.

## 2013-12-07 NOTE — Telephone Encounter (Signed)
I can try to refer to endo outside of [place] if she likes

## 2013-12-17 ENCOUNTER — Telehealth: Payer: Self-pay | Admitting: *Deleted

## 2013-12-17 NOTE — Telephone Encounter (Signed)
Message sent to mychart. Patient should schedule an appointment to follow up diabetes.

## 2013-12-20 ENCOUNTER — Telehealth: Payer: Self-pay

## 2013-12-20 MED ORDER — BUTALBITAL-APAP-CAFFEINE 50-325-40 MG PO TABS: ORAL_TABLET | ORAL | Status: DC

## 2013-12-20 NOTE — Telephone Encounter (Signed)
Done hardcopy to robin  

## 2013-12-21 MED ORDER — BUTALBITAL-APAP-CAFFEINE 50-325-40 MG PO TABS: ORAL_TABLET | ORAL | Status: DC

## 2013-12-21 NOTE — Telephone Encounter (Signed)
Faxed hardcopy to [facility]

## 2013-12-21 NOTE — Telephone Encounter (Signed)
Did not received hardcopy 

## 2014-01-01 ENCOUNTER — Other Ambulatory Visit: Payer: Self-pay | Admitting: Internal Medicine

## 2014-02-15 ENCOUNTER — Other Ambulatory Visit: Payer: Self-pay | Admitting: Internal Medicine

## 2014-02-15 NOTE — Telephone Encounter (Signed)
Faxed hardcopy of Fioricet to [facility]

## 2014-02-28 DIAGNOSIS — Z0279 Encounter for issue of other medical certificate: Secondary | ICD-10-CM

## 2014-03-05 ENCOUNTER — Telehealth: Payer: Self-pay | Admitting: Internal Medicine

## 2014-03-05 NOTE — Telephone Encounter (Signed)
Patient is calling in on status of FMLA.  States she needs today.

## 2014-04-02 ENCOUNTER — Ambulatory Visit: Payer: 59 | Admitting: Internal Medicine

## 2014-04-24 ENCOUNTER — Telehealth: Payer: Self-pay | Admitting: Internal Medicine

## 2014-04-24 NOTE — Telephone Encounter (Signed)
Patient would like to know if you received her FLMA papers? Please advise

## 2014-04-24 NOTE — Telephone Encounter (Signed)
I personally am not aware of papers, though I have received and forwarded to staff for help to fill out.

## 2014-04-25 NOTE — Telephone Encounter (Signed)
Did try to locate the forms but was not successful in doing so. Did try to call the patient to inform but unable to leave a message and no answer.

## 2014-04-26 NOTE — Telephone Encounter (Signed)
Called the patient unable to contact and unable to locate forms as well.

## 2014-04-30 DIAGNOSIS — Z0279 Encounter for issue of other medical certificate: Secondary | ICD-10-CM

## 2014-05-01 NOTE — Telephone Encounter (Signed)
Received FMLA from [employer].  PCP completed and faxed form to [phone]. As printed on form.  Called the patient informed that form has been received and faxed.  Also copied for patient as well to pickup at her convenience to have her own copy.  Put original to scan. Also documented in [place].

## 2014-05-01 NOTE — Telephone Encounter (Signed)
Patients most recent FMLA PCP gave her only 4 hours per month for migraines and previous ones have given her 2 to 3 days per month for migraines.  The patient will fax over another FMLA to complete.

## 2014-05-01 NOTE — Telephone Encounter (Signed)
Patient returned your phone call. [facility] CB# is [phone]

## 2014-05-16 ENCOUNTER — Other Ambulatory Visit: Payer: Self-pay

## 2014-05-16 DIAGNOSIS — Z1231 Encounter for screening mammogram for malignant neoplasm of breast: Secondary | ICD-10-CM

## 2014-05-31 ENCOUNTER — Ambulatory Visit
Admission: RE | Admit: 2014-05-31 | Discharge: 2014-05-31 | Disposition: A | Discharge status: Home / Self Care | Payer: 59 | Source: Ambulatory Visit

## 2014-05-31 DIAGNOSIS — Z1231 Encounter for screening mammogram for malignant neoplasm of breast: Secondary | ICD-10-CM

## 2014-06-12 ENCOUNTER — Encounter: Payer: Self-pay | Admitting: Internal Medicine

## 2014-06-12 ENCOUNTER — Ambulatory Visit (INDEPENDENT_AMBULATORY_CARE_PROVIDER_SITE_OTHER): Payer: 59 | Admitting: Internal Medicine

## 2014-06-12 VITALS — BP 122/82 | HR 95 | Temp 98.3°F | Ht 61.0 in | Wt 145.1 lb

## 2014-06-12 DIAGNOSIS — E119 Type 2 diabetes mellitus without complications: Secondary | ICD-10-CM

## 2014-06-12 DIAGNOSIS — R1314 Dysphagia, pharyngoesophageal phase: Secondary | ICD-10-CM

## 2014-06-12 DIAGNOSIS — Z0189 Encounter for other specified special examinations: Secondary | ICD-10-CM

## 2014-06-12 DIAGNOSIS — R634 Abnormal weight loss: Secondary | ICD-10-CM

## 2014-06-12 DIAGNOSIS — Z0000 Encounter for general adult medical examination without abnormal findings: Secondary | ICD-10-CM

## 2014-06-12 HISTORY — DX: Dysphagia, pharyngoesophageal phase: R13.14

## 2014-06-12 MED ORDER — METFORMIN HCL ER 500 MG PO TB24: 1000.0000 mg | ORAL_TABLET | Freq: Two times a day (BID) | ORAL | Status: DC

## 2014-06-12 MED ORDER — SIMVASTATIN 40 MG PO TABS: ORAL_TABLET | ORAL | Status: DC

## 2014-06-12 MED ORDER — BUTALBITAL-APAP-CAFFEINE 50-325-40 MG PO TABS: ORAL_TABLET | ORAL | Status: DC

## 2014-06-12 MED ORDER — SUMATRIPTAN SUCCINATE 100 MG PO TABS: 100.0000 mg | ORAL_TABLET | ORAL | Status: DC | PRN

## 2014-06-12 MED ORDER — TOPIRAMATE 50 MG PO TABS: 50.0000 mg | ORAL_TABLET | Freq: Two times a day (BID) | ORAL | Status: DC

## 2014-06-12 NOTE — Patient Instructions (Addendum)
Please take all new medication as prescribed - the imitrex for HA if needed  Please continue all other medications as before, and refills have been done if requested.  Please have the pharmacy call with any other refills you may need.  Please continue your efforts at being more active, low cholesterol diet, and weight control.  Please keep your appointments with your specialists as you may have planned  You will be contacted regarding the referral for: Gastroenterology for the swallowing issue  Please go to the LAB in the Basement (turn left off the elevator) for the tests to be done at your convenience  You will be contacted by phone if any changes need to be made immediately.  Otherwise, you will receive a letter about your results with an explanation, but please check with MyChart first.  Please remember to sign up for MyChart if you have not done so, as this will be important to you in the future with finding out test results, communicating by private email, and scheduling acute appointments online when needed.  Please return in 6 months, or sooner if needed, with Lab testing done 3-5 days before

## 2014-06-12 NOTE — Progress Notes (Signed)
Pre visit review using our clinic review tool, if applicable. No additional management support is needed unless otherwise documented below in the visit note. 

## 2014-06-12 NOTE — Progress Notes (Signed)
Subjective:    Patient ID: Rachel [name], female    DOB: 1966-02-25, 48 y.o.   MRN: [id number]  HPI  Here to f/u, has had significant wt loss,  Pt denies polydipsia, polyuria, or low sugar symptoms such as weakness or confusion improved with po intake.  Pt states overall good compliance with meds, trying to follow lower cholesterol, diabetic diet.  Also with worsening dysphagia to solids recent, but no vomit, abd pain and Denies worsening reflux, n/v, bowel change or blood.  Has recurrent migraine overall stable freq and severity.  Denies worsening depressive symptoms, suicidal ideation, or panic Past Medical History  Diagnosis Date  . DIABETES MELLITUS, TYPE II 08/02/2007  . HYPERLIPIDEMIA 08/02/2007  . COMMON MIGRAINE 05/15/2009  . ALLERGIC RHINITIS 10/04/2007  . GERD 08/02/2007  . INTERMITTENT VERTIGO 05/15/2009  . INSOMNIA-SLEEP DISORDER-UNSPEC 01/04/2008  . LIBIDO, DECREASED 01/09/2010  . Anemia 01/21/2011  . ASTHMA 08/02/2007    INHALERS ONLY IN [place]   Past Surgical History  Procedure Laterality Date  . Cesarean section    . S/p left knee surgury    . Parotidectomy  10/21/2011  . Parotidectomy  10/21/2011    Procedure: PAROTIDECTOMY;  Surgeon: Melida Quitter, MD;  Location: [place];  Service: ENT;  Laterality: Left;    reports that she has never smoked. She has never used smokeless tobacco. She reports that she does not drink alcohol or use illicit drugs. family history includes Asthma in her father; Cancer in her other; Diabetes in her father; Hypertension in her father and mother. There is no history of Anesthesia problems. Allergies  Allergen Reactions  . Atorvastatin     REACTION: myalgias  . Nitroglycerin    Current Outpatient Prescriptions on File Prior to Visit  Medication Sig Dispense Refill  . aspirin 81 MG EC tablet Take 81 mg by mouth daily.      . Diclofenac Potassium 50 MG PACK Take 50 mg by mouth once as needed. Take once daily as needed with headache onset. Please take  with food 9 each 3  . esomeprazole (NEXIUM) 40 MG capsule Take 1 capsule (40 mg total) by mouth daily. 90 capsule 3  . insulin glargine (LANTUS) 100 UNIT/ML injection Inject 0.3 mLs (30 Units total) into the skin daily. 15 mL 12  . Insulin Pen Needle (BD PEN NEEDLE NANO U/F) 32G X 4 MM MISC To be used with lantus 100 each 12  . pantoprazole (PROTONIX) 40 MG tablet TAKE 1 TABLET BY MOUTH DAILY. 90 tablet 3  . SM LANCETS 33G MISC USE AS DIRECTED 100 each 3  . TRUETEST TEST test strip USE AS DIRECTED ONCE DAILY. DIAGNOSIS CODE 250.02 100 each 12   No current facility-administered medications on file prior to visit.   Review of Systems  Constitutional: Negative for unusual diaphoresis or other sweats  HENT: Negative for ringing in ear Eyes: Negative for double vision or worsening visual disturbance.  Respiratory: Negative for choking and stridor.   Gastrointestinal: Negative for vomiting or other signifcant bowel change Genitourinary: Negative for hematuria or decreased urine volume.  Musculoskeletal: Negative for other MSK pain or swelling Skin: Negative for color change and worsening wound.  Neurological: Negative for tremors and numbness other than noted  Psychiatric/Behavioral: Negative for decreased concentration or agitation other than above       Objective:   Physical Exam BP 122/82 mmHg  Pulse 95  Temp(Src) 98.3 F (36.8 C) (Oral)  Ht 5\' 1"  (1.549 m)  Wt  145 lb 2 oz (65.828 kg)  BMI 27.44 kg/m2  SpO2 97% VS noted,  Constitutional: Pt appears normal wt, but has lost recent HENT: Head: NCAT.  Right Ear: External ear normal.  Left Ear: External ear normal.  Eyes: . Pupils are equal, round, and reactive to light. Conjunctivae and EOM are normal Neck: Normal range of motion. Neck supple.  Cardiovascular: Normal rate and regular rhythm.   Pulmonary/Chest: Effort normal and breath sounds without rales or wheezing.  Abd:  Soft, NT, ND, + BS Neurological: Pt is alert. Not  confused , motor grossly intact Skin: Skin is warm. No rash Psychiatric: Pt behavior is normal. No agitation.     Assessment & Plan:

## 2014-06-21 NOTE — Assessment & Plan Note (Signed)
Will need GI referral, ? Need endoscopy,  to f/u any worsening symptoms or concerns

## 2014-06-21 NOTE — Assessment & Plan Note (Signed)
?   Control, cont same tx, check a1c,  to f/u any worsening symptoms or concerns

## 2014-06-21 NOTE — Assessment & Plan Note (Signed)
Etiology unclear - ? DM uncontrolled seems likely given hx, cant r/o GI related as well, cont to monior

## 2014-06-24 ENCOUNTER — Other Ambulatory Visit (INDEPENDENT_AMBULATORY_CARE_PROVIDER_SITE_OTHER): Payer: 59

## 2014-06-24 ENCOUNTER — Encounter: Payer: Self-pay | Admitting: Nurse Practitioner

## 2014-06-24 ENCOUNTER — Ambulatory Visit (INDEPENDENT_AMBULATORY_CARE_PROVIDER_SITE_OTHER): Payer: 59 | Admitting: Nurse Practitioner

## 2014-06-24 VITALS — BP 98/70 | HR 95 | Temp 98.2°F | Ht 61.0 in | Wt 146.4 lb

## 2014-06-24 DIAGNOSIS — G44209 Tension-type headache, unspecified, not intractable: Secondary | ICD-10-CM

## 2014-06-24 DIAGNOSIS — Z0000 Encounter for general adult medical examination without abnormal findings: Secondary | ICD-10-CM

## 2014-06-24 DIAGNOSIS — Z0189 Encounter for other specified special examinations: Secondary | ICD-10-CM

## 2014-06-24 DIAGNOSIS — E119 Type 2 diabetes mellitus without complications: Secondary | ICD-10-CM

## 2014-06-24 LAB — BASIC METABOLIC PANEL
BUN: 11 mg/dL (ref 6–23)
CO2: 26 mEq/L (ref 19–32)
Calcium: 9.4 mg/dL (ref 8.4–10.5)
Chloride: 103 mEq/L (ref 96–112)
Creatinine, Ser: 0.5 mg/dL (ref 0.4–1.2)
GFR: 126.96 mL/min (ref >60.00)
Glucose, Bld: 106 mg/dL — ABNORMAL HIGH (ref 70–99)
Potassium: 4.3 mEq/L (ref 3.5–5.1)
Sodium: 138 mEq/L (ref 135–145)

## 2014-06-24 LAB — LIPID PANEL
Cholesterol: 232 mg/dL — ABNORMAL HIGH (ref 0–200)
HDL: 53.6 mg/dL (ref >39.00)
LDL Cholesterol: 147 mg/dL — ABNORMAL HIGH (ref 0–99)
NonHDL: 178.4
Total CHOL/HDL Ratio: 4
Triglycerides: 156 mg/dL — ABNORMAL HIGH (ref 0.0–149.0)
VLDL: 31.2 mg/dL (ref 0.0–40.0)

## 2014-06-24 LAB — URINALYSIS, ROUTINE W REFLEX MICROSCOPIC
Bilirubin Urine: NEGATIVE
Hgb urine dipstick: NEGATIVE
Ketones, ur: NEGATIVE
Leukocytes, UA: NEGATIVE
Nitrite: NEGATIVE
RBC / HPF: NONE SEEN (ref 0–?)
Specific Gravity, Urine: 1.01 (ref 1.000–1.030)
Total Protein, Urine: NEGATIVE
Urine Glucose: NEGATIVE
Urobilinogen, UA: 0.2 (ref 0.0–1.0)
pH: 8 (ref 5.0–8.0)

## 2014-06-24 LAB — CBC WITH DIFFERENTIAL/PLATELET
Basophils Absolute: 0.1 10*3/uL (ref 0.0–0.1)
Basophils Relative: 0.5 % (ref 0.0–3.0)
Eosinophils Absolute: 0.1 10*3/uL (ref 0.0–0.7)
Eosinophils Relative: 0.6 % (ref 0.0–5.0)
HCT: 41.5 % (ref 36.0–46.0)
Hemoglobin: 13.3 g/dL (ref 12.0–15.0)
Lymphocytes Relative: 52 % — ABNORMAL HIGH (ref 12.0–46.0)
Lymphs Abs: 4.9 10*3/uL — ABNORMAL HIGH (ref 0.7–4.0)
MCHC: 32.1 g/dL (ref 30.0–36.0)
MCV: 79.8 fl (ref 78.0–100.0)
Monocytes Absolute: 0.7 10*3/uL (ref 0.1–1.0)
Monocytes Relative: 7.7 % (ref 3.0–12.0)
Neutro Abs: 3.7 10*3/uL (ref 1.4–7.7)
Neutrophils Relative %: 39.2 % — ABNORMAL LOW (ref 43.0–77.0)
Platelets: 384 10*3/uL (ref 150.0–400.0)
RBC: 5.2 Mil/uL — ABNORMAL HIGH (ref 3.87–5.11)
RDW: 14 % (ref 11.5–15.5)
WBC: 9.4 10*3/uL (ref 4.0–10.5)

## 2014-06-24 LAB — HEPATIC FUNCTION PANEL
ALT: 15 U/L (ref 0–35)
AST: 19 U/L (ref 0–37)
Albumin: 4.4 g/dL (ref 3.5–5.2)
Alkaline Phosphatase: 69 U/L (ref 39–117)
Bilirubin, Direct: 0.1 mg/dL (ref 0.0–0.3)
Total Bilirubin: 0.7 mg/dL (ref 0.2–1.2)
Total Protein: 7.8 g/dL (ref 6.0–8.3)

## 2014-06-24 LAB — TSH: TSH: 1.45 u[IU]/mL (ref 0.35–4.50)

## 2014-06-24 LAB — HEMOGLOBIN A1C: Hgb A1c MFr Bld: 12.7 % — ABNORMAL HIGH (ref 4.6–6.5)

## 2014-06-24 MED ORDER — ONDANSETRON 8 MG PO TBDP: 8.0000 mg | ORAL_TABLET | Freq: Two times a day (BID) | ORAL | Status: DC | PRN

## 2014-06-24 MED ORDER — METHOCARBAMOL 500 MG PO TABS: 500.0000 mg | ORAL_TABLET | Freq: Two times a day (BID) | ORAL | Status: DC | PRN

## 2014-06-24 NOTE — Progress Notes (Signed)
Pre visit review using our clinic review tool, if applicable. No additional management support is needed unless otherwise documented below in the visit note. 

## 2014-06-24 NOTE — Progress Notes (Signed)
Subjective:     Rachel [name] is a 49 y.o. female who presents for evaluation of dizziness. The symptoms started 3 days ago and are ongoing. She feels dizzy constantly. Positions that worsen symptoms: rolling in bed to the left and standing up. Previous workup/treatments: none. Associated ear symptoms: none. Associated CNS symptoms: headaches-she has had MHA for 3 days. Pain is relieved w/imitrex, but comes back within a few hours. Pain is located at back of neck & head. No change in vision. She has light & noise sensitivity & pounding sensation in head w/walking. She states this HA is no different from past HA. Dizziness is new. Recent infections: none. Head trauma: denied. Drug ingestion: none. Noise exposure: no occupational exposure.   The following portions of the patient's history were reviewed and updated as appropriate: allergies, current medications, past medical history, past social history, past surgical history and problem list.  Review of Systems Constitutional: negative for fatigue and fevers Ears, nose, mouth, throat, and face: negative for earaches, hoarseness and nasal congestion Respiratory: negative for cough Cardiovascular: negative for lower extremity edema and palpitations Gastrointestinal: positive for nausea, negative for change in bowel habits and diarrhea Genitourinary:negative for dysuria Musculoskeletal:positive for muscle tightness across shoulders & posterior neck Neurological: negative for gait problems, paresthesia and weakness    Objective:    BP 98/70 mmHg  Pulse 95  Temp(Src) 98.2 F (36.8 C) (Oral)  Ht 5\' 1"  (1.549 m)  Wt 146 lb 6.4 oz (66.407 kg)  BMI 27.68 kg/m2  SpO2 96% General appearance: alert, cooperative, appears stated age, mild distress and increased nausea during exam when sat from supine position, squinting due to photosensitivity Head: Normocephalic, without obvious abnormality, atraumatic Eyes: negative findings: lids and lashes normal,  conjunctivae and sclerae normal and pupils equal, round, reactive to light and accomodation, positive findings: R eye nystagmus w/Dix-Halpike Maneuver. Wearing blue contacts-pt has brown eyes. Ears: normal TM's and external ear canals both ears Throat: abnormal findings: R tonsil larger than L, no exudate Neck: no adenopathy, no carotid bruit, supple, symmetrical, trachea midline and thyroid not enlarged, symmetric, no tenderness/mass/nodules Lungs: clear to auscultation bilaterally Heart: regular rate and rhythm, S1, S2 normal, no murmur, click, rub or gallop Extremities: extremities normal, atraumatic, no cyanosis or edema Neurologic: Alert and oriented X 3, normal strength and tone. Normal symmetric reflexes. Normal coordination and gait      Assessment:Plan    1. Tension headache Use heat, muscle relaxer, stretches. OK to use imitrex 2 doses in 24 hrs. zofran PRN - ondansetron (ZOFRAN-ODT) 8 MG disintegrating tablet; Take 1 tablet (8 mg total) by mouth every 12 (twelve) hours as needed for nausea or vomiting.  Dispense: 20 tablet; Refill: 0 - methocarbamol (ROBAXIN) 500 MG tablet; Take 1 tablet (500 mg total) by mouth 2 (two) times daily as needed for muscle spasms.  Dispense: 30 tablet; Refill: 0  2 BPPV Nystagmus on dix-halpike maneuver Epleys maneuver, PRN  Complete labs that Dr Jenny Reichmann ordered 2 weeks ago. F/u PRN no improvement

## 2014-06-24 NOTE — Patient Instructions (Addendum)
Use moist heat several times daily on neck & shoulders (sock w/rice). Perform neck & shoulder exercises several times daily.  Visit you tube for these exercise regimens: "physio Neck Exercises: Stretch & Relieve routine" by Mauri Brooklyn; "Pilates Seated Stretch" by [employer]; "Physio med Neck & Upper body stretch Exercises-Occupational Physiotherapy". Perform epley's maneuver. Repeat as needed. Use muscle relaxer at night.  Use zofran for nausea. You ma repeat imitrex 2 hours after 1st dose if no relief or headache returns (only 2 doses in 24 hours). Please complete labs Dr Jenny Reichmann ordered. Please see Dr Jenny Reichmann if no relief.   Shoulder Pain The shoulder is the joint that connects your arms to your body. The bones that form the shoulder joint include the upper arm bone (humerus), the shoulder blade (scapula), and the collarbone (clavicle). The top of the humerus is shaped like a ball and fits into a rather flat socket on the scapula (glenoid cavity). A combination of muscles and strong, fibrous tissues that connect muscles to bones (tendons) support your shoulder joint and hold the ball in the socket. Small, fluid-filled sacs (bursae) are located in different areas of the joint. They act as cushions between the bones and the overlying soft tissues and help reduce friction between the gliding tendons and the bone as you move your arm. Your shoulder joint allows a wide range of motion in your arm. This range of motion allows you to do things like scratch your back or throw a ball. However, this range of motion also makes your shoulder more prone to pain from overuse and injury. Causes of shoulder pain can originate from both injury and overuse and usually can be grouped in the following four categories:  Redness, swelling, and pain (inflammation) of the tendon (tendinitis) or the bursae (bursitis).  Instability, such as a dislocation of the joint.  Inflammation of the joint (arthritis).  Broken  bone (fracture). HOME CARE INSTRUCTIONS   Apply ice to the sore area.  Put ice in a plastic bag.  Place a towel between your skin and the bag.  Leave the ice on for 15-20 minutes, 03-04 times per day for the first 2 days.  Stop using cold packs if they do not help with the pain.  If you have a shoulder sling or immobilizer, wear it as long as your caregiver instructs. Only remove it to shower or bathe. Move your arm as little as possible, but keep your hand moving to prevent swelling.  Squeeze a soft ball or foam pad as much as possible to help prevent swelling.  Only take over-the-counter or prescription medicines for pain, discomfort, or fever as directed by your caregiver. SEEK MEDICAL CARE IF:   Your shoulder pain increases, or new pain develops in your arm, hand, or fingers.  Your hand or fingers become cold and numb.  Your pain is not relieved with medicines. SEEK IMMEDIATE MEDICAL CARE IF:   Your arm, hand, or fingers are numb or tingling.  Your arm, hand, or fingers are significantly swollen or turn white or blue. MAKE SURE YOU:   Understand these instructions.  Will watch your condition.  Will get help right away if you are not doing well or get worse. Document Released: 03/10/2005 Document Revised: 02/23/2012 Document Reviewed: 05/15/2011 [facility] Patient Information 2014 [place]. Maniobra de Engineer, petroleum, cuidado personal Immunologist Self-Care) QU ES LA MANIOBRA DE EPLEY? La Yahoo de Epley es un ejercicio que puede Optometrist para Public house manager los sntomas del vrtigo posicional paroxstico  benigno (VPPB). Esta afeccin a menudo se conoce como vrtigo. El movimiento de unos pequeos cristales (canalculos) dentro del odo interno ocasiona el VPPB. La acumulacin y el movimiento de los canalculos en el odo interno ocasionan una repentina sensacin de aceleracin (vrtigo) cuando se mueve la cabeza en ciertas posiciones. El vrtigo por lo general dura unos  30das. El VPPB normalmente ocurre solo en un odo. Si siente vrtigo cuando se recuesta sobre el lado izquierdo, probablemente tenga VPPB en el odo izquierdo. El mdico le puede decir qu odo est involucrado.  Una lesin en la cabeza puede ocasionar el VPPB. Muchas personas de ms de 50aos tienen VPPB por motivos desconocidos. Si se le diagnostic VPPB, el mdico puede ensearle a Runner, broadcasting/film/video. El VPPB no es potencialmente mortal (benigno) y normalmente se pasa con el tiempo.  [place]? Puede realizar [employer] en su casa cuando tenga los sntomas de vrtigo. Puede realizar Neurosurgeon de Epley hasta 3veces en un da hasta que los sntomas de vrtigo desaparezcan. [place] DE EPLEY?  Sintese en el borde de una cama o una mesa con la espalda recta. Las piernas deben estar extendidas o colgando sobre el borde de la cama o la mesa.  Gire la cabeza a medias hacia el lado del odo afectado.  Recustese hacia atrs con la cabeza girada hasta que se encuentre recostado sobre la espalda. Quizs [place] colocar una almohada debajo de los hombros.  Mantenga esta posicin durante 30segundos. Es posible que experimente un ataque de vrtigo. Esto es normal. Mantenga esta posicin hasta que el vrtigo desaparezca.  Luego gire la cabeza en direccin opuesta hasta que el odo no afectado est orientado al suelo.  Mantenga esta posicin durante 30segundos. Es posible que experimente un ataque de vrtigo. Esto es normal. Mantenga esta posicin hasta que el vrtigo desaparezca.  Ahora gire todo el cuerpo hacia el mismo lado que la cabeza. Mantenga esta posicin durante otros 30segundos.  Luego, vuelva a sentarse. ESTA MANIOBRA PRESENTA RIESGOS? En algunos casos, puede tener otros sntomas (como cambios en la visin, debilidad o entumecimiento). Si tiene estos sntomas, deje de Statistician y llame al mdico. Memory Dance si  realizar esta maniobra lo [place] del vrtigo, es posible que sienta mareos. El mareo es la sensacin de desvanecimiento pero sin la sensacin de [place]. Aunque la Yahoo de Engineer, petroleum lo [place] del vrtigo, es posible que los sntomas vuelvan durante los siguientes 5aos. QU DEBO HACER DESPUS DE ESTA [place]? Puede retomar sus actividades normales despus de Optometrist la [place] de Engineer, petroleum. Pregntele al mdico si debe hacer algo en su casa para prevenir el vrtigo. Esta puede incluir:  Dormir con dos o ms almohadas para Theatre manager la cabeza elevada.  No dormir sobre el lado del odo afectado.  Levantarse lentamente de la cama.  Evitar los movimientos repentinos Agricultural consultant.  Evitar los movimientos de cabeza intensos, como mirar hacia arriba o Office manager.  Utilizar un collar cervical para evitar los movimientos de cabeza repentinos. [place] SI LOS SNTOMAS EMPEORAN? Llame al mdico si el vrtigo empeora. Llame al mdico inmediatamente si tiene otros sntomas, incluidos:   Nuseas.  Vmitos.  Dolor de [place].  Debilidad.  Entumecimiento.  Cambios en la visin. Document Released: 06/05/2013 [facility] Patient Information 2015 [place]. This information is not intended to replace advice given to you by your health care provider. Make sure you discuss any questions you have with your health  care provider.   Vrtigo postural benigno (Benign Positional Vertigo)  Vrtigo es la sensacin de que el entorno se mueve estando quieto. Es la forma ms frecuente de vrtigo. Benigno significa que la causa del trastorno no es grave. Es ms frecuente en adultos mayores. CAUSAS  Es el resultado de un trastorno en el sistema laberntico. Es una zona en el odo medio que ayuda a controlar el equilibrio. La causa puede ser una infeccin viral, una lesin en la cabeza o un movimiento repetitivo. Sin embargo, a menudo no se Research scientist (life sciences).  SNTOMAS  Los sntomas de vrtigo posicional  benigno se producen al mover la cabeza o los ojos en diferentes direcciones. Algunos de los sntomas pueden ser:   Prdida de equilibrio y cadas.  Vmitos.  Visin borrosa.  Mareos.  Nuseas.  Movimientos oculares involuntarios (nistagmus). DIAGNSTICO  El vrtigo postural benigno se diagnostica mediante un examen fsico. Si la causa especfica de su vrtigo posicional benigno es desconocido, su mdico puede indicar diagnsticos por imgenes, como la Health visitor (RM) o la tomografa computada (TC).  TRATAMIENTO  El [employer] podr recomendar movimientos o procedimientos para corregir el vrtigo posicional benigno. Para tratar los sntomas pueden indicarse medicamentos como meclizina, benzodiazepinas y medicamentos para las nuseas. En casos raros, si los sntomas son causados   por ciertos trastornos que afectan el odo interno, es posible que necesite [place].  [place] indicaciones del mdico.  Muvase lentamente. No haga movimientos bruscos con la cabeza ni el cuerpo.  Evite conducir vehculos.  Evite operar maquinarias pesadas.  Evite realizar tareas que seran peligrosas para usted u otras personas durante un episodio de vrtigo.  Debe ingerir gran cantidad de lquido para mantener la orina de tono claro o color amarillo plido. SOLICITE ATENCIN [place] DE INMEDIATO SI:   Tiene dificultad para hablar, caminar, siente debilidad o tiene problemas para usar los brazos, las [place] piernas.  Tiene dificultad para respirar.  Sufre un dolor de cabeza intenso.  Las nuseas o los vmitos persisten o [place].  Tiene cambios en la visin.  Sus familiares o amigos notan cambios en su conducta.  El dolor [place].  Tiene fiebre.  Comienza a sentir rigidez en el cuello o sensibilidad a la luz. ASEGRESE DE QUE:   Comprende estas instrucciones.  Controlar su enfermedad.  Solicitar ayuda de inmediato si no mejora  o si empeora. Document Released: 09/16/2008 Document Revised: 08/23/2011 [facility] Patient Information 2015 [place]. This information is not intended to replace advice given to you by your health care provider. Make sure you discuss any questions you have with your health care provider.

## 2014-06-25 ENCOUNTER — Other Ambulatory Visit: Payer: Self-pay | Admitting: Internal Medicine

## 2014-06-25 DIAGNOSIS — E1140 Type 2 diabetes mellitus with diabetic neuropathy, unspecified: Secondary | ICD-10-CM

## 2014-06-25 LAB — MICROALBUMIN / CREATININE URINE RATIO
Creatinine,U: 44.1 mg/dL
Microalb Creat Ratio: 3.4 mg/g (ref 0.0–30.0)
Microalb, Ur: 1.5 mg/dL (ref 0.0–1.9)

## 2014-06-25 MED ORDER — ROSUVASTATIN CALCIUM 20 MG PO TABS: 20.0000 mg | ORAL_TABLET | Freq: Every day | ORAL | Status: DC

## 2014-07-01 ENCOUNTER — Observation Stay (HOSPITAL_COMMUNITY): Payer: 59

## 2014-07-01 ENCOUNTER — Emergency Department (HOSPITAL_COMMUNITY): Payer: 59

## 2014-07-01 ENCOUNTER — Observation Stay (HOSPITAL_COMMUNITY)
Admission: EM | Admit: 2014-07-01 | Discharge: 2014-07-01 | Disposition: A | Discharge status: Home / Self Care | Payer: 59 | Attending: Internal Medicine | Admitting: Internal Medicine

## 2014-07-01 ENCOUNTER — Encounter (HOSPITAL_COMMUNITY): Payer: Self-pay | Admitting: Emergency Medicine

## 2014-07-01 DIAGNOSIS — Z7982 Long term (current) use of aspirin: Secondary | ICD-10-CM | POA: Diagnosis not present

## 2014-07-01 DIAGNOSIS — R079 Chest pain, unspecified: Principal | ICD-10-CM

## 2014-07-01 DIAGNOSIS — K219 Gastro-esophageal reflux disease without esophagitis: Secondary | ICD-10-CM | POA: Insufficient documentation

## 2014-07-01 DIAGNOSIS — E119 Type 2 diabetes mellitus without complications: Secondary | ICD-10-CM | POA: Diagnosis not present

## 2014-07-01 DIAGNOSIS — J45909 Unspecified asthma, uncomplicated: Secondary | ICD-10-CM | POA: Insufficient documentation

## 2014-07-01 DIAGNOSIS — Z79899 Other long term (current) drug therapy: Secondary | ICD-10-CM | POA: Diagnosis not present

## 2014-07-01 DIAGNOSIS — R072 Precordial pain: Secondary | ICD-10-CM

## 2014-07-01 DIAGNOSIS — E785 Hyperlipidemia, unspecified: Secondary | ICD-10-CM | POA: Diagnosis not present

## 2014-07-01 DIAGNOSIS — Z794 Long term (current) use of insulin: Secondary | ICD-10-CM | POA: Insufficient documentation

## 2014-07-01 LAB — CBC WITH DIFFERENTIAL/PLATELET
Basophils Absolute: 0 10*3/uL (ref 0.0–0.1)
Basophils Relative: 0 % (ref 0–1)
Eosinophils Absolute: 0.1 10*3/uL (ref 0.0–0.7)
Eosinophils Relative: 1 % (ref 0–5)
HCT: 35.9 % — ABNORMAL LOW (ref 36.0–46.0)
Hemoglobin: 11.7 g/dL — ABNORMAL LOW (ref 12.0–15.0)
Lymphocytes Relative: 46 % (ref 12–46)
Lymphs Abs: 3.9 10*3/uL (ref 0.7–4.0)
MCH: 25.8 pg — ABNORMAL LOW (ref 26.0–34.0)
MCHC: 32.6 g/dL (ref 30.0–36.0)
MCV: 79.2 fL (ref 78.0–100.0)
Monocytes Absolute: 0.6 10*3/uL (ref 0.1–1.0)
Monocytes Relative: 8 % (ref 3–12)
Neutro Abs: 3.7 10*3/uL (ref 1.7–7.7)
Neutrophils Relative %: 45 % (ref 43–77)
Platelets: 381 10*3/uL (ref 150–400)
RBC: 4.53 MIL/uL (ref 3.87–5.11)
RDW: 13.6 % (ref 11.5–15.5)
WBC: 8.4 10*3/uL (ref 4.0–10.5)

## 2014-07-01 LAB — BASIC METABOLIC PANEL
Anion gap: 7 (ref 5–15)
BUN: 6 mg/dL (ref 6–23)
CO2: 26 mmol/L (ref 19–32)
Calcium: 9.2 mg/dL (ref 8.4–10.5)
Chloride: 101 mEq/L (ref 96–112)
Creatinine, Ser: 0.64 mg/dL (ref 0.50–1.10)
GFR calc Af Amer: >90 mL/min (ref >90)
GFR calc non Af Amer: >90 mL/min (ref >90)
Glucose, Bld: 197 mg/dL — ABNORMAL HIGH (ref 70–99)
Potassium: 3.7 mmol/L (ref 3.5–5.1)
Sodium: 134 mmol/L — ABNORMAL LOW (ref 135–145)

## 2014-07-01 LAB — TROPONIN I
Troponin I: <0.03 ng/mL (ref <0.031)
Troponin I: <0.03 ng/mL (ref <0.031)

## 2014-07-01 MED ORDER — METOPROLOL TARTRATE 1 MG/ML IV SOLN
5.0000 mg | Freq: Once | INTRAVENOUS | Status: AC
  Filled 2014-07-01: qty 5

## 2014-07-01 MED ORDER — METOPROLOL TARTRATE 25 MG PO TABS
25.0000 mg | ORAL_TABLET | Freq: Once | ORAL | Status: AC
  Administered 2014-07-01: 25 mg via ORAL
  Filled 2014-07-01: qty 1

## 2014-07-01 MED ORDER — IOHEXOL 350 MG/ML SOLN
80.0000 mL | Freq: Once | INTRAVENOUS | Status: AC | PRN
  Administered 2014-07-01: 80 mL via INTRAVENOUS

## 2014-07-01 MED ORDER — SODIUM CHLORIDE 0.9 % IV BOLUS (SEPSIS)
250.0000 mL | Freq: Once | INTRAVENOUS | Status: AC
  Administered 2014-07-01: 250 mL via INTRAVENOUS

## 2014-07-01 MED ORDER — ASPIRIN EC 325 MG PO TBEC
325.0000 mg | DELAYED_RELEASE_TABLET | Freq: Once | ORAL | Status: AC
  Administered 2014-07-01: 325 mg via ORAL
  Filled 2014-07-01: qty 1

## 2014-07-01 MED ORDER — MORPHINE SULFATE 4 MG/ML IJ SOLN
4.0000 mg | Freq: Once | INTRAMUSCULAR | Status: AC
Start: 2014-07-01 — End: 2014-07-01
  Administered 2014-07-01: 4 mg via INTRAVENOUS
  Filled 2014-07-01: qty 1

## 2014-07-01 NOTE — ED Notes (Signed)
Dr Nishan at bedside.  

## 2014-07-01 NOTE — Discharge Instructions (Signed)

## 2014-07-01 NOTE — ED Notes (Addendum)
Pt states last night she started having left sided chest pain and felt nauseous. This morning pt went work and left arm started feeling "heavy" and came to ED. Last night pt took 4 81 mg aspirin and pain subsided until this am when it returned.

## 2014-07-01 NOTE — Consult Note (Signed)
CARDIOLOGY CONSULT NOTE       Patient ID: Rachel [name] MRN: [id number] DOB/AGE: Oct 05, 1965 49 y.o.  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  Starting having SSCP last night  Intermitant sharp pains with radiation to left arm  Some rest pain and some exertional No pleuritic or positional component  Her husband indicates some recent stress at home as two of his children have started living at house with them  No fever cough dyspnea.  No palpitations or syncope. No recent URI or trauma.  CXR with no active disease and troponin negative   ROS All other systems reviewed and negative except as noted above  Past Medical History  Diagnosis Date  . DIABETES MELLITUS, TYPE II 08/02/2007  . HYPERLIPIDEMIA 08/02/2007  . COMMON MIGRAINE 05/15/2009  . ALLERGIC RHINITIS 10/04/2007  . GERD 08/02/2007  . INTERMITTENT VERTIGO 05/15/2009  . INSOMNIA-SLEEP DISORDER-UNSPEC 01/04/2008  . LIBIDO, DECREASED 01/09/2010  . Anemia 01/21/2011  . ASTHMA 08/02/2007    INHALERS ONLY IN [place]    Family History  Problem Relation Age of Onset  . Hypertension Mother   . Hypertension Father   . Diabetes Father   . Asthma Father   . Cancer Other     cervical cancer  . Anesthesia problems Neg Hx     History   Social History  . Marital Status: Married    Spouse Name: [name]    Number of Children: 3  . Years of Education: Degree   Occupational History  . NURSE TECH   . [employer]   Social History Main Topics  . Smoking status: Never Smoker   . Smokeless tobacco: Never Used  . Alcohol Use: No  . Drug Use: No  . Sexual Activity: Yes   Other Topics Concern  . Not on file   Social History Narrative   She has 3 daughters.   Patient has a 4 year degree.    Patient working  at [employer].    Patient is married to [place].    Past Surgical History  Procedure Laterality Date  . Cesarean section    . S/p left knee surgury    . Parotidectomy  10/21/2011  . Parotidectomy  10/21/2011    Procedure: PAROTIDECTOMY;  Surgeon: Melida Quitter, MD;  Location: [facility];  Service: ENT;  Laterality: Left;     . metoprolol  5 mg Intravenous Once  . metoprolol tartrate  25 mg Oral Once      Physical Exam: Blood pressure 114/78, pulse 80, temperature 98.1 F (36.7 C), temperature source Oral, resp. rate 20, height 5\' 3"  (1.6 m), weight 66.225 kg (146 lb), SpO2 100 %.    Affect appropriate Healthy:  appears stated age 49: normal Neck supple with no adenopathy JVP normal no bruits no thyromegaly Lungs clear with no wheezing and good diaphragmatic motion Heart:  S1/S2 no murmur, no rub, gallop or click PMI normal Abdomen: benighn, BS positve, no tenderness, no AAA no bruit.  No HSM or HJR Distal pulses intact with no bruits No edema Neuro non-focal Skin warm and dry No muscular weakness   Labs:   Lab Results  Component Value Date   WBC 8.4 07/01/2014   HGB 11.7* 07/01/2014   HCT  35.9* 07/01/2014   MCV 79.2 07/01/2014   PLT 381 07/01/2014    Recent Labs Lab 07/01/14 0814  NA 134*  K 3.7  CL 101  CO2 26  BUN 6  CREATININE 0.64  CALCIUM 9.2  GLUCOSE 197*   Lab Results  Component Value Date   CKTOTAL 53 07/24/2009   CKMB 0.5 07/24/2009   TROPONINI <0.03 07/01/2014    Lab Results  Component Value Date   CHOL 232* 06/24/2014   CHOL 285* 10/04/2013   CHOL 241* 06/13/2012   Lab Results  Component Value Date   HDL 53.60 06/24/2014   HDL 47.70 10/04/2013   HDL 44.30 06/13/2012   Lab Results  Component Value Date   LDLCALC 147* 06/24/2014   LDLCALC 183* 10/04/2013   LDLCALC 104* 01/15/2011   Lab Results  Component Value Date   TRIG 156.0* 06/24/2014   TRIG 270.0* 10/04/2013   TRIG 168.0* 06/13/2012   Lab Results  Component Value Date     CHOLHDL 4 06/24/2014   CHOLHDL 6 10/04/2013   CHOLHDL 5 06/13/2012   Lab Results  Component Value Date   LDLDIRECT 178.8 06/13/2012   LDLDIRECT 171.8 01/25/2012   LDLDIRECT 142.8 06/18/2011      Radiology: Dg Chest 2 View  07/01/2014   CLINICAL DATA:  Left-sided chest pain  EXAM: CHEST  2 VIEW  COMPARISON:  September 12, 2012  FINDINGS: There is no edema or consolidation. The heart size and pulmonary vascularity are normal. No adenopathy. No bone lesions. No pneumothorax.  IMPRESSION: No edema or consolidation.   Electronically Signed   By: Lowella Grip M.D.   On: 07/01/2014 08:26    EKG:  SR low voltage nonspecific ST/T wave changes    ASSESSMENT AND PLAN:  Chest Pain: Atypical features but multiple CRF;s  Previous cath negative 2011 Appears to have poorly controlled DM  Favor cardiac CT rather than functional study Will try to arrange this afternoon DM:  Lantus and SS insulin A1c pending needs dietary consult Chol:  Continue crestor 20 mg   Signed: Jenkins Rouge 07/01/2014, 12:49 PM

## 2014-07-01 NOTE — ED Provider Notes (Signed)
CSN: [id number]     Arrival date & time 07/01/14  0750 History   First MD Initiated Contact with Patient 07/01/14 [phone]     Chief Complaint  Patient presents with  . Chest Pain     (Consider location/radiation/quality/duration/timing/severity/associated sxs/prior Treatment) Patient is a 49 y.o. female presenting with chest pain. The history is provided by the patient. No language interpreter was used.  Chest Pain Pain location:  L chest Pain quality: pressure   Pain radiates to:  L arm Pain radiates to the back: no   Pain severity:  Severe Onset quality:  Gradual Duration:  1 day Timing:  Intermittent Progression:  Waxing and waning Chronicity:  New Context: not breathing, not lifting and no movement   Relieved by:  Aspirin Worsened by:  Exertion Ineffective treatments:  Rest Associated symptoms: no abdominal pain, no anxiety, no back pain, no cough, no dizziness, no fatigue, no nausea, no numbness, no shortness of breath, not vomiting and no weakness   Risk factors: diabetes mellitus and high cholesterol   Risk factors: no aortic disease, no birth control, no hypertension, not female, not obese and no smoking     Past Medical History  Diagnosis Date  . DIABETES MELLITUS, TYPE II 08/02/2007  . HYPERLIPIDEMIA 08/02/2007  . COMMON MIGRAINE 05/15/2009  . ALLERGIC RHINITIS 10/04/2007  . GERD 08/02/2007  . INTERMITTENT VERTIGO 05/15/2009  . INSOMNIA-SLEEP DISORDER-UNSPEC 01/04/2008  . LIBIDO, DECREASED 01/09/2010  . Anemia 01/21/2011  . ASTHMA 08/02/2007    INHALERS ONLY IN [place]   Past Surgical History  Procedure Laterality Date  . Cesarean section    . S/p left knee surgury    . Parotidectomy  10/21/2011  . Parotidectomy  10/21/2011    Procedure: PAROTIDECTOMY;  Surgeon: Melida Quitter, MD;  Location: [facility] OR;  Service: ENT;  Laterality: Left;   Family History  Problem Relation Age of Onset  . Hypertension Mother   . Hypertension Father   . Diabetes Father   . Asthma Father   .  Cancer Other     cervical cancer  . Anesthesia problems Neg Hx    History  Substance Use Topics  . Smoking status: Never Smoker   . Smokeless tobacco: Never Used  . Alcohol Use: No   OB History    No data available     Review of Systems  Constitutional: Negative for chills and fatigue.  Respiratory: Negative for cough and shortness of breath.   Cardiovascular: Positive for chest pain.  Gastrointestinal: Negative for nausea, vomiting and abdominal pain.  Genitourinary: Negative for dysuria, urgency and frequency.  Musculoskeletal: Negative for back pain.  Neurological: Negative for dizziness, weakness and numbness.  All other systems reviewed and are negative.     Allergies  Atorvastatin and Nitroglycerin  Home Medications   Prior to Admission medications   Medication Sig Start Date End Date Taking? Authorizing Provider  aspirin 81 MG EC tablet Take 81 mg by mouth daily.     Yes Historical Provider, MD  butalbital-acetaminophen-caffeine (FIORICET, ESGIC) 50-325-40 MG per tablet TAKE 1 TABLET BY MOUTH EVERY 6 HOURS AS NEEDED FOR HEADACHE (MAX OF 1-2 TABLETS PER DAY) 06/12/14  Yes Biagio Borg, MD  Diclofenac Potassium 50 MG PACK Take 50 mg by mouth once as needed. Take once daily as needed with headache onset. Please take with food 10/04/13  Yes Biagio Borg, MD  insulin glargine (LANTUS) 100 UNIT/ML injection Inject 0.3 mLs (30 Units total) into the skin daily. Patient  taking differently: Inject 30 Units into the skin at bedtime.  10/04/13 10/04/14 Yes Biagio Borg, MD  metFORMIN (GLUCOPHAGE XR) 500 MG 24 hr tablet Take 2 tablets (1,000 mg total) by mouth 2 (two) times daily with a meal. 06/12/14  Yes Biagio Borg, MD  methocarbamol (ROBAXIN) 500 MG tablet Take 1 tablet (500 mg total) by mouth 2 (two) times daily as needed for muscle spasms. 06/24/14  Yes Irene Pap, NP  ondansetron (ZOFRAN-ODT) 8 MG disintegrating tablet Take 1 tablet (8 mg total) by mouth every 12 (twelve)  hours as needed for nausea or vomiting. 06/24/14  Yes Irene Pap, NP  pantoprazole (PROTONIX) 40 MG tablet TAKE 1 TABLET BY MOUTH DAILY. 10/04/13  Yes Biagio Borg, MD  simvastatin (ZOCOR) 40 MG tablet TAKE 1 TABLET BY MOUTH EVERY EVENING. 06/12/14  Yes Biagio Borg, MD  SUMAtriptan (IMITREX) 100 MG tablet Take 1 tablet (100 mg total) by mouth every 2 (two) hours as needed for migraine or headache. May repeat in 2 hours if headache persists or recurs. 06/12/14  Yes Biagio Borg, MD  topiramate (TOPAMAX) 50 MG tablet Take 1 tablet (50 mg total) by mouth 2 (two) times daily. Patient taking differently: Take 50 mg by mouth at bedtime.  06/12/14  Yes Biagio Borg, MD  esomeprazole (NEXIUM) 40 MG capsule Take 1 capsule (40 mg total) by mouth daily. Patient not taking: Reported on 07/01/2014 10/04/13   Biagio Borg, MD  Insulin Pen Needle (BD PEN NEEDLE NANO U/F) 32G X 4 MM MISC To be used with lantus 10/09/13   Biagio Borg, MD  rosuvastatin (CRESTOR) 20 MG tablet Take 1 tablet (20 mg total) by mouth daily. Patient not taking: Reported on 07/01/2014 06/25/14   Biagio Borg, MD  SM LANCETS 33G MISC USE AS DIRECTED 03/29/13   Biagio Borg, MD  TRUETEST TEST test strip USE AS DIRECTED ONCE DAILY. DIAGNOSIS CODE 250.02 01/01/14   Biagio Borg, MD   BP 91/72 mmHg  Pulse 68  Temp(Src) 98.5 F (36.9 C) (Oral)  Resp 18  Ht 5\' 3"  (1.6 m)  Wt 146 lb (66.225 kg)  BMI 25.87 kg/m2  SpO2 98% Physical Exam  Constitutional: She is oriented to person, place, and time. She appears well-developed and well-nourished. No distress.  HENT:  Head: Normocephalic and atraumatic.  Eyes: Pupils are equal, round, and reactive to light.  Neck: Normal range of motion.  Cardiovascular: Normal rate, regular rhythm, normal heart sounds and intact distal pulses.   Pulses:      Radial pulses are 2+ on the right side, and 2+ on the left side.  Pulmonary/Chest: Effort normal. No respiratory distress. She has no wheezes. She  exhibits no tenderness.  Abdominal: Soft. Bowel sounds are normal. She exhibits no distension. There is no tenderness. There is no rebound and no guarding.  Neurological: She is alert and oriented to person, place, and time. She has normal strength. No cranial nerve deficit or sensory deficit. She exhibits normal muscle tone. Coordination and gait normal.  Skin: Skin is warm and dry.  Nursing note and vitals reviewed.   ED Course  Procedures (including critical care time) Labs Review Labs Reviewed  CBC WITH DIFFERENTIAL - Abnormal; Notable for the following:    Hemoglobin 11.7 (*)    HCT 35.9 (*)    MCH 25.8 (*)    All other components within normal limits  BASIC METABOLIC PANEL - Abnormal; Notable  for the following:    Sodium 134 (*)    Glucose, Bld 197 (*)    All other components within normal limits  TROPONIN I  TROPONIN I    Imaging Review Dg Chest 2 View  07/01/2014   CLINICAL DATA:  Left-sided chest pain  EXAM: CHEST  2 VIEW  COMPARISON:  September 12, 2012  FINDINGS: There is no edema or consolidation. The heart size and pulmonary vascularity are normal. No adenopathy. No bone lesions. No pneumothorax.  IMPRESSION: No edema or consolidation.   Electronically Signed   By: Lowella Grip M.D.   On: 07/01/2014 08:26   Ct Coronary Morp W/cta Cor W/score W/ca W/cm &/or Wo/cm  07/01/2014   ADDENDUM REPORT: 07/01/2014 15:23  CLINICAL DATA:  Chest pain  EXAM: Cardiac CTA  MEDICATIONS: Sub lingual nitro. 4mg  and lopressor 2.5mg  25 mg oral lopressor  TECHNIQUE: The patient was scanned on a Philips 973 slice scanner. Gantry rotation speed was 270 msecs. Collimation was .34mm. A 100 kV prospective scan was triggered in the descending thoracic aorta at 111 HU's with 5% padding centered around 78% of the R-R interval. Average HR during the scan was 64 bpm. The 3D data set was interpreted on a dedicated work station using MPR, MIP and VRT modes. A total of 80cc of contrast was used.  FINDINGS:  Non-cardiac: See separate report from [facility]. No significant findings on limited lung and soft tissue windows.  Calcium Score: 10 small foci of calcium in proximal and mid LAD Aortic root normal 3.0 cm  Coronary Arteries: Right dominant with no anomalies  LM: Normal  LAD:  Less than 30% calcific disease in proximal and mid vessel  D1: Normal  D2: Normal  Circumflex: Normal  OM1: Normal  OM2: Normal  OM3:  Low lying normal  RCA:  Less than 30% soft plaque in proximal vessel Dominant Normal  PDA: Normal  PLA:  Normal  IMPRESSION: 1) Coronary calcium score 10 small foci in proximal and mid LAD 88th percentile for age and sex matched controls  2) No obstructive CAD. Mild calcific plaque in proximal and mid LAD and mild soft plaque in proximal RCA Right dominant coronary arteries  3) Normal ascending aortic root 3.0 cm  4) No significant non cardiac findings  Jenkins Rouge   Electronically Signed   By: Jenkins Rouge M.D.   On: 07/01/2014 15:23   07/01/2014   EXAM: OVER-READ INTERPRETATION  CT CHEST  The following report is an over-read performed by radiologist Dr. Alvino Blood [facility] on 07/01/2014. This over-read does not include interpretation of cardiac or coronary anatomy or pathology. The coronary coronary CTA interpretation by the cardiologist is attached.  COMPARISON:  Radiograph 07/01/2014  FINDINGS: Limited view of the lung fields are unremarkable. Limited view of the chest wall and skeleton unremarkable. Limited view of the upper abdomen is unremarkable.  IMPRESSION: No significant extra cardiac findings.  Electronically Signed: By: Suzy Bouchard M.D. On: 07/01/2014 14:44     EKG Interpretation   Date/Time:  Monday July 01 2014 07:57:05 EST Ventricular Rate:  85 PR Interval:  148 QRS Duration: 80 QT Interval:  354 QTC Calculation: 421 R Axis:   32 Text Interpretation:  Sinus rhythm Low voltage, extremity leads Similar to  previous Confirmed by ZAVITZ  MD,  JOSHUA ([id number]) on 07/01/2014 8:17:45 AM      MDM   Final diagnoses:  Chest pain  Type 2 diabetes mellitus without complication  Patient is a 49 year old Hispanic female with a pertinent past medical history of diabetes and hyperlipidemia comes to the emergency department today with left-sided chest pressure radiating to her left arm worse with exertion for the past 2 days. Physical exam as above. Initial differential is concerning for MI, ACS, pneumonia, pneumothorax, Boerhaave's, and a PE. Initial workup included a CBC, BMP, troponin, chest x-ray, and an EKG. Patient does not have a history of PE, no lower extremity edema, and no chest pain with deep breathing as result I feel a PE is unlikely and that does not have to be evaluated for further at this time. CBC was unremarkable. BMP was unremarkable. Initial troponin was negative. EKG is detailed above. EKG was unchanged however there are some findings concerning for ACS on patient's old EKGs as well as today's. Chest x-ray demonstrated no consolidations, doubt pneumonia. There was no evidence of pneumothorax. Patient refused nitroglycerin as she states that it causes her to develop hypotension.  She was treated with 325 mg aspirin upon arrival. Patient's presentation and history are concerning for ACS. She has a HEAR score of 4 as result I feel she requires admission to the hospital for an ACS rule out. Patient's care was discussed with the hospitalist who agreed with this plan. The requested that the cardiology be consulted in the emergency department. Cardiology evaluated the patient in the ED.  They recommended that the patient receive a cardiac CT.  Cardiac CT demonstrated no concerning findings per cardiology.  They did not recommend further cardiac evaluation at this time.  As a result I feel the patient is stable for discharge.  She was instructed to follow-up with her PCP in a week to ensure that she improves and to return to the ED with worsening  pain, weakness, numbness, shortness of breath, or any other concerns.  The patient expressed understanding.  Care was discussed with my attending Dr. Reather Converse.   Katheren Shams, MD 07/01/14 1606  Mariea Clonts, MD 07/02/14 [phone]

## 2014-07-04 ENCOUNTER — Encounter: Payer: Self-pay | Admitting: Internal Medicine

## 2014-07-04 ENCOUNTER — Ambulatory Visit (INDEPENDENT_AMBULATORY_CARE_PROVIDER_SITE_OTHER): Payer: 59 | Admitting: Internal Medicine

## 2014-07-04 VITALS — BP 98/68 | HR 89 | Temp 98.1°F | Ht 61.0 in | Wt 148.1 lb

## 2014-07-04 DIAGNOSIS — R072 Precordial pain: Secondary | ICD-10-CM

## 2014-07-04 DIAGNOSIS — R42 Dizziness and giddiness: Secondary | ICD-10-CM

## 2014-07-04 DIAGNOSIS — E119 Type 2 diabetes mellitus without complications: Secondary | ICD-10-CM

## 2014-07-04 DIAGNOSIS — J4520 Mild intermittent asthma, uncomplicated: Secondary | ICD-10-CM

## 2014-07-04 MED ORDER — MECLIZINE HCL 12.5 MG PO TABS: 12.5000 mg | ORAL_TABLET | Freq: Three times a day (TID) | ORAL | Status: DC | PRN

## 2014-07-04 NOTE — Progress Notes (Signed)
Subjective:    Patient ID: Rachel [name], female    DOB: 09-28-1965, 49 y.o.   MRN: [id number]  HPI  Here to f/u CP and dizziness. Seen in Er recently, cardiac CT neg for obstructive CAD, Has dull CP intermittent ongoing, cxr neg jan 18.  BP mild lower today but dizziness is not lightheaded, is more positional vertiginous, with nausea occas.  Zofran helps only somewhat. Has not tried meclizine.  No HA, fever, sinus congestion, ST or cough.  CBG better in the past wk, CBG was low at 60 last PM, has good compliance with meds in the past wk, where compliance has been a significant issue. So now that she is taking her meds reguarly recently, cut back to 16 units lantus to avoid further lower sugars. Has not yet f/u with endo.  Pt denies other chest pain, increased sob or doe, wheezing, orthopnea, PND, increased LE swelling, palpitations, dizziness or syncope. Past Medical History  Diagnosis Date  . DIABETES MELLITUS, TYPE II 08/02/2007  . HYPERLIPIDEMIA 08/02/2007  . COMMON MIGRAINE 05/15/2009  . ALLERGIC RHINITIS 10/04/2007  . GERD 08/02/2007  . INTERMITTENT VERTIGO 05/15/2009  . INSOMNIA-SLEEP DISORDER-UNSPEC 01/04/2008  . LIBIDO, DECREASED 01/09/2010  . Anemia 01/21/2011  . ASTHMA 08/02/2007    INHALERS ONLY IN [place]   Past Surgical History  Procedure Laterality Date  . Cesarean section    . S/p left knee surgury    . Parotidectomy  10/21/2011  . Parotidectomy  10/21/2011    Procedure: PAROTIDECTOMY;  Surgeon: Melida Quitter, MD;  Location: [place];  Service: ENT;  Laterality: Left;    reports that she has never smoked. She has never used smokeless tobacco. She reports that she does not drink alcohol or use illicit drugs. family history includes Asthma in her father; Cancer in her other; Diabetes in her father; Hypertension in her father and mother. There is no history of Anesthesia problems. Allergies  Allergen Reactions  . Atorvastatin     REACTION: myalgias  . Nitroglycerin    Current Outpatient  Prescriptions on File Prior to Visit  Medication Sig Dispense Refill  . aspirin 81 MG EC tablet Take 81 mg by mouth daily.      . butalbital-acetaminophen-caffeine (FIORICET, ESGIC) 50-325-40 MG per tablet TAKE 1 TABLET BY MOUTH EVERY 6 HOURS AS NEEDED FOR HEADACHE (MAX OF 1-2 TABLETS PER DAY) 30 tablet 1  . Diclofenac Potassium 50 MG PACK Take 50 mg by mouth once as needed. Take once daily as needed with headache onset. Please take with food 9 each 3  . esomeprazole (NEXIUM) 40 MG capsule Take 1 capsule (40 mg total) by mouth daily. 90 capsule 3  . insulin glargine (LANTUS) 100 UNIT/ML injection Inject 0.3 mLs (30 Units total) into the skin daily. (Patient taking differently: Inject 30 Units into the skin at bedtime. ) 15 mL 12  . Insulin Pen Needle (BD PEN NEEDLE NANO U/F) 32G X 4 MM MISC To be used with lantus 100 each 12  . metFORMIN (GLUCOPHAGE XR) 500 MG 24 hr tablet Take 2 tablets (1,000 mg total) by mouth 2 (two) times daily with a meal. 120 tablet 11  . methocarbamol (ROBAXIN) 500 MG tablet Take 1 tablet (500 mg total) by mouth 2 (two) times daily as needed for muscle spasms. 30 tablet 0  . ondansetron (ZOFRAN-ODT) 8 MG disintegrating tablet Take 1 tablet (8 mg total) by mouth every 12 (twelve) hours as needed for nausea or vomiting. 20 tablet 0  .  pantoprazole (PROTONIX) 40 MG tablet TAKE 1 TABLET BY MOUTH DAILY. 90 tablet 3  . rosuvastatin (CRESTOR) 20 MG tablet Take 1 tablet (20 mg total) by mouth daily. 90 tablet 3  . simvastatin (ZOCOR) 40 MG tablet TAKE 1 TABLET BY MOUTH EVERY EVENING. 90 tablet 3  . SM LANCETS 33G MISC USE AS DIRECTED 100 each 3  . SUMAtriptan (IMITREX) 100 MG tablet Take 1 tablet (100 mg total) by mouth every 2 (two) hours as needed for migraine or headache. May repeat in 2 hours if headache persists or recurs. 10 tablet 5  . topiramate (TOPAMAX) 50 MG tablet Take 1 tablet (50 mg total) by mouth 2 (two) times daily. (Patient taking differently: Take 50 mg by mouth at  bedtime. ) 60 tablet 5  . TRUETEST TEST test strip USE AS DIRECTED ONCE DAILY. DIAGNOSIS CODE 250.02 100 each 12   No current facility-administered medications on file prior to visit.    Review of Systems  Constitutional: Negative for unusual diaphoresis or other sweats  HENT: Negative for ringing in ear Eyes: Negative for double vision or worsening visual disturbance.  Respiratory: Negative for choking and stridor.   Gastrointestinal: Negative for vomiting or other signifcant bowel change Genitourinary: Negative for hematuria or decreased urine volume.  Musculoskeletal: Negative for other MSK pain or swelling Skin: Negative for color change and worsening wound.  Neurological: Negative for tremors and numbness other than noted  Psychiatric/Behavioral: Negative for decreased concentration or agitation other than above       Objective:   Physical Exam BP 98/68 mmHg  Pulse 89  Temp(Src) 98.1 F (36.7 C) (Oral)  Ht 5\' 1"  (1.549 m)  Wt 148 lb 2 oz (67.189 kg)  BMI 28.00 kg/m2  SpO2 97% VS noted,  Constitutional: Pt appears well-developed, well-nourished.  HENT: Head: NCAT.  Right Ear: External ear normal.  Left Ear: External ear normal.  Bilat tm's with trace erythema.  Max sinus areas mild tender.  Pharynx with trace erythema, no exudate Eyes: . Pupils are equal, round, and reactive to light. Conjunctivae and EOM are normal Neck: Normal range of motion. Neck supple.  Cardiovascular: Normal rate and regular rhythm.   Pulmonary/Chest: Effort normal and breath sounds without rales or wheezing.  Abd:  Soft, NT, ND, + BS Neurological: Pt is alert. Not confused , motor grossly intact Skin: Skin is warm. No rash Psychiatric: Pt behavior is normal. No agitation.         Assessment & Plan:

## 2014-07-04 NOTE — Progress Notes (Signed)
Pre visit review using our clinic review tool, if applicable. No additional management support is needed unless otherwise documented below in the visit note. 

## 2014-07-04 NOTE — Assessment & Plan Note (Signed)
Ok for meclizine prn,  to f/u any worsening symptoms or concerns 

## 2014-07-04 NOTE — Assessment & Plan Note (Signed)
Improved by hx with better med compliance, encouraged to continue, f/u with endo as referred

## 2014-07-04 NOTE — Assessment & Plan Note (Signed)
Recent atypical CP eval c/w non cardiac, suspect MSK, for tylenol prn, educated, reassured,  to f/u any worsening symptoms or concerns

## 2014-07-04 NOTE — Patient Instructions (Signed)
Please take all new medication as prescribed - the meclizine as needed for dizziness  Please continue all other medications as before  Please have the pharmacy call with any other refills you may need.  Please keep your appointments with your specialists as you may have planned  You have been referred to Endocrinology for further follow up of the Diabetes  Please return in 6 months, or sooner if needed

## 2014-07-04 NOTE — Assessment & Plan Note (Signed)
stable overall by history and exam, recent data reviewed with pt, and pt to continue medical treatment as before,  to f/u any worsening symptoms or concerns SpO2 Readings from Last 3 Encounters:  07/04/14 97%  07/01/14 98%  06/24/14 96%

## 2014-07-10 ENCOUNTER — Telehealth: Payer: Self-pay | Admitting: Internal Medicine

## 2014-07-10 DIAGNOSIS — N644 Mastodynia: Secondary | ICD-10-CM

## 2014-07-10 NOTE — Telephone Encounter (Signed)
Done per emr 

## 2014-07-10 NOTE — Telephone Encounter (Signed)
Pt called in said that she is still having pain in her breast and would like Dr Jenny Reichmann to put referral in to see a surgeon.

## 2014-07-23 ENCOUNTER — Other Ambulatory Visit: Payer: Self-pay | Admitting: Internal Medicine

## 2014-07-23 DIAGNOSIS — N644 Mastodynia: Secondary | ICD-10-CM

## 2014-07-25 ENCOUNTER — Encounter: Payer: Self-pay | Admitting: Gastroenterology

## 2014-07-26 ENCOUNTER — Ambulatory Visit: Payer: Self-pay | Admitting: Gastroenterology

## 2014-07-30 ENCOUNTER — Other Ambulatory Visit: Payer: Self-pay | Admitting: Internal Medicine

## 2014-07-30 DIAGNOSIS — N644 Mastodynia: Secondary | ICD-10-CM

## 2014-08-05 ENCOUNTER — Ambulatory Visit
Admission: RE | Admit: 2014-08-05 | Discharge: 2014-08-05 | Disposition: A | Discharge status: Home / Self Care | Payer: 59 | Source: Ambulatory Visit | Attending: Internal Medicine | Admitting: Internal Medicine

## 2014-08-05 ENCOUNTER — Other Ambulatory Visit: Payer: Self-pay | Admitting: Internal Medicine

## 2014-08-05 DIAGNOSIS — N644 Mastodynia: Secondary | ICD-10-CM

## 2014-08-07 ENCOUNTER — Encounter: Payer: Self-pay | Admitting: Internal Medicine

## 2014-08-07 ENCOUNTER — Ambulatory Visit (INDEPENDENT_AMBULATORY_CARE_PROVIDER_SITE_OTHER): Payer: 59 | Admitting: Internal Medicine

## 2014-08-07 VITALS — BP 105/72 | HR 94 | Temp 98.4°F | Wt 152.2 lb

## 2014-08-07 DIAGNOSIS — E119 Type 2 diabetes mellitus without complications: Secondary | ICD-10-CM

## 2014-08-07 DIAGNOSIS — E11649 Type 2 diabetes mellitus with hypoglycemia without coma: Secondary | ICD-10-CM

## 2014-08-07 MED ORDER — INSULIN GLARGINE 100 UNIT/ML ~~LOC~~ SOLN: 20.0000 [IU] | Freq: Every day | SUBCUTANEOUS | Status: DC

## 2014-08-07 NOTE — Progress Notes (Signed)
Subjective:     Patient ID: Rachel [name], female   DOB: Nov 01, 1965, 49 y.o.   MRN: [id number]  HPI Rachel [name] is a pleasant 49 y.o. woman, returning for f/u for DM2, dx 1987, uncontrolled, insulin-dependent, with probable complications (? peripheral neuropathy).   Last visit was 1.5 years ago!  Last HbA1C: Lab Results  Component Value Date   HGBA1C 12.7* 06/24/2014   HGBA1C 15.1* 10/04/2013   HGBA1C 8.4* 01/11/2013  Prev. 10.1%.  She describes coming off all DM medicines for 6 mo before the HbA1c in 09/2013! She had increased thirst and urination and lost weight during this period: from a size 12 to 8!  She restarted her meds in 06/2014.  She is on: - Lantus 30 units in HS (occasionally lower b/c low sugars) - Januvia 100 mg in am - Glipizide XL 5 mg in am - Metformin XR 500 mg bid >> some diarrhea, but tolerable She had episodes of hypoglycemia with Amaryl in the past. She was on Bydureon 2 mg weekly (had nausea).  She checks her sugars ~1-2 a day. Does not bring a log.  - am: 70-120 >> 140-150s >> 50-125 - before lunch (checks if does not feel good): 80-90s - bedtime: 100-130 >> n/c >> 100-130 She has hypoglycemia awareness in the 90s now.   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   - She has seen nutrition  - Last eye appt: 06/2014 >> reportedly no DR - last lipids: Lab Results  Component Value Date   CHOL 232* 06/24/2014   HDL 53.60 06/24/2014   LDLCALC 147* 06/24/2014   LDLDIRECT 178.8 06/13/2012   TRIG 156.0* 06/24/2014   CHOLHDL 4 06/24/2014  On Simvastatin. - No CKD: Lab Results  Component Value Date   BUN 6 07/01/2014   Lab Results  Component Value Date   CREATININE 0.64 07/01/2014   - Has neuropathy R leg.   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  Review of Systems Constitutional: + decreased appetite, + weight changes, + fatigue, + hot flashes, + excessive urination Eyes: + blurry  vision, no xerophthalmia ENT: no sore throat, no nodules palpated in throat, + dysphagia/no odynophagia, no hoarseness Cardiovascular: + CP/no SOB/palpitations/leg swelling Respiratory: no cough/no SOB Gastrointestinal: no N/V/D/C, + heartburn Musculoskeletal: no muscle/joint aches Skin: no rashes Neurological: no tremors/numbness/tingling/dizziness, + HA + low libido  I reviewed pt's medications, allergies, PMH, social hx, family hx, and changes were documented in the history of present illness. Otherwise, unchanged from my initial visit note.  Objective:   Physical Exam BP 105/72 mmHg  Pulse 94  Temp(Src) 98.4 F (36.9 C) (Oral)  Wt 152 lb 3.2 oz (69.037 kg) Body mass index is 28.77 kg/(m^2). Wt Readings from Last 3 Encounters:  08/07/14 152 lb 3.2 oz (69.037 kg)  07/04/14 148 lb 2 oz (67.189 kg)  07/01/14 146 lb (66.225 kg)  Constitutional: overweight, in NAD Eyes: PERRLA, EOMI, no exophthalmos, colored contacts ENT: moist mucous membranes, no thyromegaly, no cervical lymphadenopathy Cardiovascular: tachycardia, RR, No MRG Respiratory: CTA B Gastrointestinal: abdomen soft, NT, ND, BS+ Musculoskeletal: no deformities, strength intact in all 4 Skin: moist, warm, no rashes  Assessment:     1. DM2, uncontrolled, insulin-dependent, with probable complications - ? peripheral neuropathy  Plan:     Patient with uncontrolled diabetes, after stopping all her DM meds after our last visit in 12/2012! Now  back on track with her meds, but with hypoglycemia.  - Will decrease Lantus and stop Glipizide XL - I advised her to:  Patient Instructions  Please continue: - Januvia 100 mg in am - Metformin XR 500 mg 2x a day  Please stop: - Glipizide XL   Please decrease: - Lantus to 20 units at bedtime.  Please return in 1.5 month with your sugar log.   - continue to check sugars 2x a day - rotate checks - UTD with eye exams - I will see her back in 1.5 month with her sugar  log

## 2014-08-07 NOTE — Patient Instructions (Signed)
Please continue: - Januvia 100 mg in am - Metformin XR 500 mg 2x a day  Please stop: - Glipizide XL   Please decrease: - Lantus to 20 units at bedtime.  Please return in 1.5 month with your sugar log.

## 2014-09-06 ENCOUNTER — Ambulatory Visit: Payer: 59 | Admitting: Internal Medicine

## 2014-09-16 ENCOUNTER — Ambulatory Visit: Payer: Self-pay | Admitting: Gastroenterology

## 2014-10-02 ENCOUNTER — Other Ambulatory Visit: Payer: Self-pay | Admitting: Internal Medicine

## 2014-10-02 DIAGNOSIS — N63 Unspecified lump in unspecified breast: Secondary | ICD-10-CM

## 2014-10-30 ENCOUNTER — Other Ambulatory Visit: Payer: Self-pay | Admitting: Internal Medicine

## 2014-11-04 ENCOUNTER — Inpatient Hospital Stay: Admission: RE | Admit: 2014-11-04 | Payer: 59 | Source: Ambulatory Visit

## 2014-11-27 ENCOUNTER — Encounter: Payer: Self-pay | Admitting: Internal Medicine

## 2014-11-27 ENCOUNTER — Ambulatory Visit (INDEPENDENT_AMBULATORY_CARE_PROVIDER_SITE_OTHER)
Admission: RE | Admit: 2014-11-27 | Discharge: 2014-11-27 | Disposition: A | Discharge status: Home / Self Care | Payer: 59 | Source: Ambulatory Visit | Attending: Internal Medicine | Admitting: Internal Medicine

## 2014-11-27 ENCOUNTER — Ambulatory Visit (INDEPENDENT_AMBULATORY_CARE_PROVIDER_SITE_OTHER): Payer: 59 | Admitting: Internal Medicine

## 2014-11-27 VITALS — BP 102/68 | HR 104 | Temp 98.7°F | Ht 60.0 in | Wt 146.0 lb

## 2014-11-27 DIAGNOSIS — J019 Acute sinusitis, unspecified: Secondary | ICD-10-CM | POA: Diagnosis not present

## 2014-11-27 DIAGNOSIS — E119 Type 2 diabetes mellitus without complications: Secondary | ICD-10-CM

## 2014-11-27 DIAGNOSIS — M25521 Pain in right elbow: Secondary | ICD-10-CM

## 2014-11-27 MED ORDER — LEVOFLOXACIN 500 MG PO TABS: 500.0000 mg | ORAL_TABLET | Freq: Every day | ORAL | Status: DC

## 2014-11-27 NOTE — Assessment & Plan Note (Signed)
Mild to mod, for antibx course,  to f/u any worsening symptoms or concerns 

## 2014-11-27 NOTE — Patient Instructions (Signed)
Please take all new medication as prescribed  - the antibiotic  Please continue all other medications as before, and refills have been done if requested.  Please have the pharmacy call with any other refills you may need.  Please keep your appointments with your specialists as you may have planned  Please go to the XRAY Department in the Basement (go straight as you get off the elevator) for the x-ray testing  You will be contacted by phone if any changes need to be made immediately.  Otherwise, you will receive a letter about your results with an explanation, but please check with MyChart first.  Please remember to sign up for MyChart if you have not done so, as this will be important to you in the future with finding out test results, communicating by private email, and scheduling acute appointments online when needed.    

## 2014-11-27 NOTE — Progress Notes (Signed)
Subjective:    Patient ID: Rachel [name], female    DOB: 1965/11/16, 49 y.o.   MRN: [id number]  HPI    Here with 2-3 days acute onset fever, facial pain, pressure, headache, general weakness and malaise, and greenish d/c, with mild ST and cough, but pt denies chest pain, wheezing, increased sob or doe, orthopnea, PND, increased LE swelling, palpitations, dizziness or syncope.  Pt denies new neurological symptoms such as new headache, or facial or extremity weakness or numbness  Also with pain and swelling x 2 days to right elbow after heavy object fell primarily to the lateral epicondylar area, with swelling today   Pt denies polydipsia, polyuri  Pt states overall good compliance with meds, trying to follow lower cholesterol, diabetic diet, wt overall stable   Cont;s to see heme regarding anemia, and endo for DM  Past Medical History  Diagnosis Date  . DIABETES MELLITUS, TYPE II 08/02/2007  . HYPERLIPIDEMIA 08/02/2007  . COMMON MIGRAINE 05/15/2009  . ALLERGIC RHINITIS 10/04/2007  . GERD 08/02/2007  . INTERMITTENT VERTIGO 05/15/2009  . INSOMNIA-SLEEP DISORDER-UNSPEC 01/04/2008  . LIBIDO, DECREASED 01/09/2010  . Anemia 01/21/2011  . ASTHMA 08/02/2007    INHALERS ONLY IN [place]   Past Surgical History  Procedure Laterality Date  . Cesarean section    . S/p left knee surgury    . Parotidectomy  10/21/2011  . Parotidectomy  10/21/2011    Procedure: PAROTIDECTOMY;  Surgeon: Melida Quitter, MD;  Location: [facility];  Service: ENT;  Laterality: Left;    reports that she has never smoked. She has never used smokeless tobacco. She reports that she does not drink alcohol or use illicit drugs. family history includes Asthma in her father; Cancer in her other; Diabetes in her father; Hypertension in her father and mother. There is no history of Anesthesia problems. Allergies  Allergen Reactions  . Atorvastatin     REACTION: myalgias  . Nitroglycerin    Current Outpatient Prescriptions on File Prior to Visit    Medication Sig Dispense Refill  . aspirin 81 MG EC tablet Take 81 mg by mouth daily.      . butalbital-acetaminophen-caffeine (FIORICET, ESGIC) 50-325-40 MG per tablet TAKE 1 TABLET BY MOUTH EVERY 6 HOURS AS NEEDED FOR HEADACHE (MAX OF 1-2 TABLETS PER DAY) 30 tablet 1  . Diclofenac Potassium 50 MG PACK Take 50 mg by mouth once as needed. Take once daily as needed with headache onset. Please take with food 9 each 3  . esomeprazole (NEXIUM) 40 MG capsule TAKE 1 CAPSULE BY MOUTH DAILY. 90 capsule 3  . insulin glargine (LANTUS) 100 UNIT/ML injection Inject 0.2 mLs (20 Units total) into the skin daily. 15 mL 1  . Insulin Pen Needle (BD PEN NEEDLE NANO U/F) 32G X 4 MM MISC To be used with lantus 100 each 12  . JANUVIA 100 MG tablet TAKE 1 TABLET BY MOUTH DAILY. 90 tablet 3  . meclizine (ANTIVERT) 12.5 MG tablet Take 1 tablet (12.5 mg total) by mouth 3 (three) times daily as needed for dizziness. 30 tablet 1  . metFORMIN (GLUCOPHAGE XR) 500 MG 24 hr tablet Take 2 tablets (1,000 mg total) by mouth 2 (two) times daily with a meal. 120 tablet 11  . methocarbamol (ROBAXIN) 500 MG tablet Take 1 tablet (500 mg total) by mouth 2 (two) times daily as needed for muscle spasms. 30 tablet 0  . ondansetron (ZOFRAN-ODT) 8 MG disintegrating tablet Take 1 tablet (8 mg total) by mouth  every 12 (twelve) hours as needed for nausea or vomiting. 20 tablet 0  . pantoprazole (PROTONIX) 40 MG tablet TAKE 1 TABLET BY MOUTH DAILY. 90 tablet 3  . pantoprazole (PROTONIX) 40 MG tablet TAKE 1 TABLET BY MOUTH DAILY. 90 tablet 3  . simvastatin (ZOCOR) 40 MG tablet TAKE 1 TABLET BY MOUTH EVERY EVENING. 90 tablet 3  . SM LANCETS 33G MISC USE AS DIRECTED 100 each 3  . SUMAtriptan (IMITREX) 100 MG tablet Take 1 tablet (100 mg total) by mouth every 2 (two) hours as needed for migraine or headache. May repeat in 2 hours if headache persists or recurs. 10 tablet 5  . topiramate (TOPAMAX) 50 MG tablet Take 1 tablet (50 mg total) by mouth 2  (two) times daily. (Patient taking differently: Take 50 mg by mouth at bedtime. ) 60 tablet 5  . TRUETEST TEST test strip USE AS DIRECTED ONCE DAILY. DIAGNOSIS CODE 250.02 100 each 12  . esomeprazole (NEXIUM) 40 MG capsule Take 1 capsule (40 mg total) by mouth daily. (Patient not taking: Reported on 11/27/2014) 90 capsule 3  . rosuvastatin (CRESTOR) 20 MG tablet Take 1 tablet (20 mg total) by mouth daily. (Patient not taking: Reported on 08/07/2014) 90 tablet 3   No current facility-administered medications on file prior to visit.      Review of Systems  Constitutional: Negative for unusual diaphoresis or night sweats HENT: Negative for ringing in ear or discharge Eyes: Negative for double vision or worsening visual disturbance.  Respiratory: Negative for choking and stridor.   Gastrointestinal: Negative for vomiting or other signifcant bowel change Genitourinary: Negative for hematuria or change in urine volume.  Musculoskeletal: Negative for other MSK pain or swelling Skin: Negative for color change and worsening wound.  Neurological: Negative for tremors and numbness other than noted  Psychiatric/Behavioral: Negative for decreased concentration or agitation other than above       Objective:   Physical Exam BP 102/68 mmHg  Pulse 104  Temp(Src) 98.7 F (37.1 C) (Oral)  Ht 5' (1.524 m)  Wt 146 lb (66.225 kg)  BMI 28.51 kg/m2  SpO2 97% VS noted,  Constitutional: Pt appears in no significant distress HENT: Head: NCAT.  Right Ear: External ear normal.  Left Ear: External ear normal.  Eyes: . Pupils are equal, round, and reactive to light. Conjunctivae and EOM are normal Neck: Normal range of motion. Neck supple.  Cardiovascular: Normal rate and regular rhythm.   Pulmonary/Chest: Effort normal and breath sounds without rales or wheezing.  Abd:  Soft, NT, ND, + BS Neurological: Pt is alert. Not confused , motor grossly intact Skin: Skin is warm. No rash, no LE edema Psychiatric:  Pt behavior is normal. No agitation.  Right elbow with full flexion/extension, but with tender, bruising, swelling to right lateral latera; epicondylar area    Assessment & Plan:

## 2014-11-27 NOTE — Assessment & Plan Note (Signed)
stable overall by history and exam, recent data reviewed with pt, and pt to continue medical treatment as before,  to f/u any worsening symptoms or concerns  

## 2014-11-27 NOTE — Progress Notes (Signed)
Pre visit review using our clinic review tool, if applicable. No additional management support is needed unless otherwise documented below in the visit note. 

## 2014-12-03 ENCOUNTER — Telehealth: Payer: Self-pay | Admitting: *Deleted

## 2014-12-03 NOTE — Telephone Encounter (Signed)
Unable to reach patient to schedule a1c

## 2014-12-06 ENCOUNTER — Other Ambulatory Visit: Payer: Self-pay | Admitting: *Deleted

## 2014-12-06 NOTE — Patient Outreach (Signed)
Left message on Kamyia's cell phone requesting she call [facility], care management assistant, at [phone] and schedule a Link To Wellness appointment as her last appointment was on 07/22/14 so she is overdue for quarterly follow up. Barrington Ellison RN,CCM,CDE [place] Management Coordinator Link To Wellness Office Phone [phone] Office Fax [phone]

## 2014-12-25 ENCOUNTER — Other Ambulatory Visit: Payer: Self-pay | Admitting: Internal Medicine

## 2014-12-31 ENCOUNTER — Telehealth: Payer: Self-pay | Admitting: Internal Medicine

## 2014-12-31 NOTE — Telephone Encounter (Signed)
[facility] did not receive butalbital acetaminophen.  Please resend.

## 2014-12-31 NOTE — Telephone Encounter (Signed)
Rx called into the pharmacy. 

## 2015-01-09 ENCOUNTER — Other Ambulatory Visit (INDEPENDENT_AMBULATORY_CARE_PROVIDER_SITE_OTHER): Payer: 59

## 2015-01-09 ENCOUNTER — Other Ambulatory Visit: Payer: Self-pay | Admitting: Internal Medicine

## 2015-01-09 ENCOUNTER — Encounter: Payer: Self-pay | Admitting: Internal Medicine

## 2015-01-09 ENCOUNTER — Telehealth: Payer: Self-pay | Admitting: Internal Medicine

## 2015-01-09 ENCOUNTER — Ambulatory Visit (INDEPENDENT_AMBULATORY_CARE_PROVIDER_SITE_OTHER): Payer: 59 | Admitting: Internal Medicine

## 2015-01-09 VITALS — BP 108/66 | HR 68 | Temp 98.7°F | Ht 60.0 in | Wt 145.0 lb

## 2015-01-09 DIAGNOSIS — Z0189 Encounter for other specified special examinations: Secondary | ICD-10-CM

## 2015-01-09 DIAGNOSIS — Z0000 Encounter for general adult medical examination without abnormal findings: Secondary | ICD-10-CM | POA: Diagnosis not present

## 2015-01-09 DIAGNOSIS — G43009 Migraine without aura, not intractable, without status migrainosus: Secondary | ICD-10-CM

## 2015-01-09 DIAGNOSIS — E119 Type 2 diabetes mellitus without complications: Secondary | ICD-10-CM

## 2015-01-09 LAB — CBC WITH DIFFERENTIAL/PLATELET
Basophils Absolute: 0 10*3/uL (ref 0.0–0.1)
Basophils Relative: 0.5 % (ref 0.0–3.0)
Eosinophils Absolute: 0 10*3/uL (ref 0.0–0.7)
Eosinophils Relative: 0.7 % (ref 0.0–5.0)
HCT: 38.6 % (ref 36.0–46.0)
Hemoglobin: 12.8 g/dL (ref 12.0–15.0)
Lymphocytes Relative: 52.9 % — ABNORMAL HIGH (ref 12.0–46.0)
Lymphs Abs: 3.8 10*3/uL (ref 0.7–4.0)
MCHC: 33.1 g/dL (ref 30.0–36.0)
MCV: 78.2 fl (ref 78.0–100.0)
Monocytes Absolute: 0.7 10*3/uL (ref 0.1–1.0)
Monocytes Relative: 10 % (ref 3.0–12.0)
Neutro Abs: 2.5 10*3/uL (ref 1.4–7.7)
Neutrophils Relative %: 35.9 % — ABNORMAL LOW (ref 43.0–77.0)
Platelets: 414 10*3/uL — ABNORMAL HIGH (ref 150.0–400.0)
RBC: 4.93 Mil/uL (ref 3.87–5.11)
RDW: 14.9 % (ref 11.5–15.5)
WBC: 7.1 10*3/uL (ref 4.0–10.5)

## 2015-01-09 LAB — LIPID PANEL
Cholesterol: 290 mg/dL — ABNORMAL HIGH (ref 0–200)
HDL: 60.6 mg/dL
LDL Cholesterol: 198 mg/dL — ABNORMAL HIGH (ref 0–99)
NonHDL: 229.62
Total CHOL/HDL Ratio: 5
Triglycerides: 159 mg/dL — ABNORMAL HIGH (ref 0.0–149.0)
VLDL: 31.8 mg/dL (ref 0.0–40.0)

## 2015-01-09 LAB — URINALYSIS, ROUTINE W REFLEX MICROSCOPIC
Bilirubin Urine: NEGATIVE
Hgb urine dipstick: NEGATIVE
Ketones, ur: NEGATIVE
Leukocytes, UA: NEGATIVE
Nitrite: NEGATIVE
RBC / HPF: NONE SEEN
Specific Gravity, Urine: 1.015
Total Protein, Urine: NEGATIVE
Urine Glucose: >=1000 — AB
Urobilinogen, UA: 0.2
pH: 7 (ref 5.0–8.0)

## 2015-01-09 LAB — MICROALBUMIN / CREATININE URINE RATIO
Creatinine,U: 109.2 mg/dL
Microalb Creat Ratio: 0.6 mg/g (ref 0.0–30.0)
Microalb, Ur: <0.7 mg/dL (ref 0.0–1.9)

## 2015-01-09 LAB — HEPATIC FUNCTION PANEL
ALT: 11 U/L (ref 0–35)
AST: 12 U/L (ref 0–37)
Albumin: 4.3 g/dL (ref 3.5–5.2)
Alkaline Phosphatase: 74 U/L (ref 39–117)
Bilirubin, Direct: 0.1 mg/dL (ref 0.0–0.3)
Total Bilirubin: 0.4 mg/dL (ref 0.2–1.2)
Total Protein: 7.6 g/dL (ref 6.0–8.3)

## 2015-01-09 LAB — TSH: TSH: 0.66 u[IU]/mL (ref 0.35–4.50)

## 2015-01-09 LAB — BASIC METABOLIC PANEL WITH GFR
BUN: 13 mg/dL (ref 6–23)
CO2: 30 meq/L (ref 19–32)
Calcium: 9.6 mg/dL (ref 8.4–10.5)
Chloride: 99 meq/L (ref 96–112)
Creatinine, Ser: 0.62 mg/dL (ref 0.40–1.20)
GFR: 108.02 mL/min
Glucose, Bld: 215 mg/dL — ABNORMAL HIGH (ref 70–99)
Potassium: 4 meq/L (ref 3.5–5.1)
Sodium: 135 meq/L (ref 135–145)

## 2015-01-09 LAB — HEMOGLOBIN A1C: Hgb A1c MFr Bld: 10.8 % — ABNORMAL HIGH (ref 4.6–6.5)

## 2015-01-09 MED ORDER — PANTOPRAZOLE SODIUM 40 MG PO TBEC: 40.0000 mg | DELAYED_RELEASE_TABLET | Freq: Every day | ORAL | Status: DC

## 2015-01-09 MED ORDER — SITAGLIPTIN PHOSPHATE 100 MG PO TABS: 100.0000 mg | ORAL_TABLET | Freq: Every day | ORAL | Status: DC

## 2015-01-09 MED ORDER — ESOMEPRAZOLE MAGNESIUM 40 MG PO CPDR: 40.0000 mg | DELAYED_RELEASE_CAPSULE | Freq: Every day | ORAL | Status: DC

## 2015-01-09 MED ORDER — TOPIRAMATE 50 MG PO TABS: 50.0000 mg | ORAL_TABLET | Freq: Two times a day (BID) | ORAL | Status: DC

## 2015-01-09 MED ORDER — SIMVASTATIN 40 MG PO TABS: ORAL_TABLET | ORAL | Status: DC

## 2015-01-09 MED ORDER — ROSUVASTATIN CALCIUM 20 MG PO TABS: 20.0000 mg | ORAL_TABLET | Freq: Every day | ORAL | Status: DC

## 2015-01-09 MED ORDER — METFORMIN HCL ER 500 MG PO TB24: 1000.0000 mg | ORAL_TABLET | Freq: Two times a day (BID) | ORAL | Status: DC

## 2015-01-09 NOTE — Progress Notes (Signed)
Subjective:    Patient ID: Rachel [name], female    DOB: 21-Mar-1966, 49 y.o.   MRN: [id number]  HPI   Here for wellness and f/u;  Overall doing ok;  Pt denies Chest pain, worsening SOB, DOE, wheezing, orthopnea, PND, worsening LE edema, palpitations, dizziness or syncope.  Pt denies neurological change such as new headache, facial or extremity weakness  Still with now worsening 2 wks daily migraine, better somewhat with fioricet.  Pt denies polydipsia, polyuria, or low sugar symptoms. Pt states overall good compliance with treatment and medications, good tolerability, and has been trying to follow appropriate diet.  Pt denies worsening depressive symptoms, suicidal ideation or panic. No fever, night sweats, wt loss, loss of appetite, or other constitutional symptoms.  Pt states good ability with ADL's, has low fall risk, home safety reviewed and adequate, no other significant changes in hearing or vision, and only occasionally active with exercise.  Past Medical History  Diagnosis Date  . DIABETES MELLITUS, TYPE II 08/02/2007  . HYPERLIPIDEMIA 08/02/2007  . COMMON MIGRAINE 05/15/2009  . ALLERGIC RHINITIS 10/04/2007  . GERD 08/02/2007  . INTERMITTENT VERTIGO 05/15/2009  . INSOMNIA-SLEEP DISORDER-UNSPEC 01/04/2008  . LIBIDO, DECREASED 01/09/2010  . Anemia 01/21/2011  . ASTHMA 08/02/2007    INHALERS ONLY IN [place]   Past Surgical History  Procedure Laterality Date  . Cesarean section    . S/p left knee surgury    . Parotidectomy  10/21/2011  . Parotidectomy  10/21/2011    Procedure: PAROTIDECTOMY;  Surgeon: Melida Quitter, MD;  Location: [facility];  Service: ENT;  Laterality: Left;    reports that she has never smoked. She has never used smokeless tobacco. She reports that she does not drink alcohol or use illicit drugs. family history includes Asthma in her father; Cancer in her other; Diabetes in her father; Hypertension in her father and mother. There is no history of Anesthesia problems. Allergies    Allergen Reactions  . Atorvastatin     REACTION: myalgias  . Nitroglycerin    Current Outpatient Prescriptions on File Prior to Visit  Medication Sig Dispense Refill  . aspirin 81 MG EC tablet Take 81 mg by mouth daily.      . butalbital-acetaminophen-caffeine (FIORICET, ESGIC) 50-325-40 MG per tablet TAKE 1 TABLET BY MOUTH EVERY 6 HOURS AS NEEDED FOR HEADACHE (MAX 1-2 TABS PER DAY) 30 tablet 0  . Diclofenac Potassium 50 MG PACK Take 50 mg by mouth once as needed. Take once daily as needed with headache onset. Please take with food 9 each 3  . insulin glargine (LANTUS) 100 UNIT/ML injection Inject 0.2 mLs (20 Units total) into the skin daily. 15 mL 1  . Insulin Pen Needle (BD PEN NEEDLE NANO U/F) 32G X 4 MM MISC To be used with lantus 100 each 12  . meclizine (ANTIVERT) 12.5 MG tablet Take 1 tablet (12.5 mg total) by mouth 3 (three) times daily as needed for dizziness. 30 tablet 1  . methocarbamol (ROBAXIN) 500 MG tablet Take 1 tablet (500 mg total) by mouth 2 (two) times daily as needed for muscle spasms. 30 tablet 0  . ondansetron (ZOFRAN-ODT) 8 MG disintegrating tablet Take 1 tablet (8 mg total) by mouth every 12 (twelve) hours as needed for nausea or vomiting. 20 tablet 0  . SM LANCETS 33G MISC USE AS DIRECTED 100 each 3  . SUMAtriptan (IMITREX) 100 MG tablet Take 1 tablet (100 mg total) by mouth every 2 (two) hours as needed for  migraine or headache. May repeat in 2 hours if headache persists or recurs. 10 tablet 5  . TRUETEST TEST test strip USE AS DIRECTED ONCE DAILY. DIAGNOSIS CODE 250.02 100 each 12  . levofloxacin (LEVAQUIN) 500 MG tablet Take 1 tablet (500 mg total) by mouth daily. (Patient not taking: Reported on 01/09/2015) 10 tablet 0  . rosuvastatin (CRESTOR) 20 MG tablet Take 1 tablet (20 mg total) by mouth daily. (Patient not taking: Reported on 01/09/2015) 90 tablet 3   No current facility-administered medications on file prior to visit.   Review of Systems Constitutional:  Negative for increased diaphoresis, other activity, appetite or siginficant weight change other than noted HENT: Negative for worsening hearing loss, ear pain, facial swelling, mouth sores and neck stiffness.   Eyes: Negative for other worsening pain, redness or visual disturbance.  Respiratory: Negative for shortness of breath and wheezing  Cardiovascular: Negative for chest pain and palpitations.  Gastrointestinal: Negative for diarrhea, blood in stool, abdominal distention or other pain Genitourinary: Negative for hematuria, flank pain or change in urine volume.  Musculoskeletal: Negative for myalgias or other joint complaints.  Skin: Negative for color change and wound or drainage.  Neurological: Negative for syncope and numbness. other than noted Hematological: Negative for adenopathy. or other swelling Psychiatric/Behavioral: Negative for hallucinations, SI, self-injury, decreased concentration or other worsening agitation.      Objective:   Physical Exam BP 108/66 mmHg  Pulse 68  Temp(Src) 98.7 F (37.1 C) (Oral)  Ht 5' (1.524 m)  Wt 145 lb (65.772 kg)  BMI 28.32 kg/m2  SpO2 97% VS noted,  Constitutional: Pt is oriented to person, place, and time. Appears well-developed and well-nourished, in no significant distress Head: Normocephalic and atraumatic.  Right Ear: External ear normal.  Left Ear: External ear normal.  Nose: Nose normal.  Mouth/Throat: Oropharynx is clear and moist.  Eyes: Conjunctivae and EOM are normal. Pupils are equal, round, and reactive to light.  Neck: Normal range of motion. Neck supple. No JVD present. No tracheal deviation present or significant neck LA or mass Cardiovascular: Normal rate, regular rhythm, normal heart sounds and intact distal pulses.   Pulmonary/Chest: Effort normal and breath sounds without rales or wheezing  Abdominal: Soft. Bowel sounds are normal. NT. No HSM  Musculoskeletal: Normal range of motion. Exhibits no edema.    Lymphadenopathy:  Has no cervical adenopathy.  Neurological: Pt is alert and oriented to person, place, and time. Pt has normal reflexes. No cranial nerve deficit. Motor grossly intact Skin: Skin is warm and dry. No rash noted.  Psychiatric:  Has normal mood and affect. Behavior is normal.     Assessment & Plan:

## 2015-01-09 NOTE — Assessment & Plan Note (Signed)
Hx of poor control, cont same meds, f/u a1c and endo f/u - pt states will call

## 2015-01-09 NOTE — Progress Notes (Signed)
Pre visit review using our clinic review tool, if applicable. No additional management support is needed unless otherwise documented below in the visit note. 

## 2015-01-09 NOTE — Assessment & Plan Note (Signed)
For med restart, now daily migraine - for referral [facility]

## 2015-01-09 NOTE — Patient Instructions (Addendum)
Please continue all other medications as before, and refills have been done if requested.  Please have the pharmacy call with any other refills you may need.  Please continue your efforts at being more active, low cholesterol diet, and weight control.  You are otherwise up to date with prevention measures today.  Please keep your appointments with your specialists as you may have planned  You will be contacted regarding the referral for: colonoscopy  Please go to the LAB in the Basement (turn left off the elevator) for the tests to be done today  You will be contacted by phone if any changes need to be made immediately.  Otherwise, you will receive a letter about your results with an explanation, but please check with MyChart first.  Please remember to sign up for MyChart if you have not done so, as this will be important to you in the future with finding out test results, communicating by private email, and scheduling acute appointments online when needed.  Please return in 1 year for your yearly visit, or sooner if needed 

## 2015-01-09 NOTE — Telephone Encounter (Signed)
Wrong patient.  Encounter was opened in error

## 2015-01-09 NOTE — Assessment & Plan Note (Signed)

## 2015-01-10 ENCOUNTER — Other Ambulatory Visit: Payer: Self-pay | Admitting: Internal Medicine

## 2015-01-10 NOTE — Telephone Encounter (Signed)
Needs appt for further refills.

## 2015-01-29 ENCOUNTER — Other Ambulatory Visit: Payer: Self-pay | Admitting: *Deleted

## 2015-01-29 NOTE — Patient Outreach (Signed)
Another message left on [facility]' mobile number requesting she make a Link To Wellness follow up appointment as she is overdue. She saw Dr. Jenny Reichmann on 7/28 for her annual wellness appointment and her A1C was 10.8% and she was encouraged to see Dr. Cruzita Lederer for DM management assistance,  also Crestor 20 mg was added as her lipid panel showed elevated total cholesterol, triglycerides and LDL. Barrington Ellison RN,CCM,CDE [place] [facility] Management Coordinator Link To Wellness Office Phone [phone] Office Fax [phone]

## 2015-02-18 ENCOUNTER — Telehealth: Payer: Self-pay | Admitting: *Deleted

## 2015-02-18 ENCOUNTER — Other Ambulatory Visit: Payer: Self-pay | Admitting: Internal Medicine

## 2015-02-18 NOTE — Telephone Encounter (Signed)
Left msg on triage requesting refill on her headache medicine Fioricet...Johny Chess

## 2015-02-19 NOTE — Telephone Encounter (Signed)
Pls advise on msg below.../lmb 

## 2015-02-19 NOTE — Telephone Encounter (Signed)
Notified pt md ok rx verified which pharmacy. Faxed script to [facility]...Rachel [name]

## 2015-02-19 NOTE — Telephone Encounter (Signed)
Already Done hardcopy to [facility]

## 2015-02-19 NOTE — Telephone Encounter (Signed)
Done Done hardcopy to Dahlia  

## 2015-03-03 ENCOUNTER — Encounter: Payer: Self-pay | Admitting: *Deleted

## 2015-03-03 ENCOUNTER — Other Ambulatory Visit: Payer: Self-pay | Admitting: *Deleted

## 2015-03-04 NOTE — Patient Outreach (Signed)
[facility]) Care Management   03/02/14  CLETUS MEHLHOFF Sep 17, 1965 [id number]  LASHEENA FRIEZE is an 49 y.o. female who presents to the [facility] office for routine Link To Wellness follow up for self management assistance with Type II DM and hyperlipidemia. Subjective:  Berneda says she continues to be bothered with chronic migraines and misses a significant amount of work as the headaches cause symptoms of nausea, photophobia, in addition to severe pain that starts in her shoulder then moves to her neck and head. She says the headaches feel like there is a tight band on her head. She says she will see a another neurologist on 9/23 and is asking help in where he is located and the time of her appointment. She says she has tried to make a follow up appointment with Dr Cruzita Lederer but has not been able to work out a time. She says she saw Dr. Jenny Reichmann on July 28th and her A1C was high (10.8%) and he encouraged her to make an appointment with Dr Cruzita Lederer ASAP.  She says her cholesterol panel was also abnormal so she was changed to Crestor. She says she is adherent with her medications most of the time. She says she gains weight with the insulin.  She identifies her hypoglycemic threshold at 90.  Her husband's two sons, ages 75 an 13 years, recently moved in with them after moving from the [place] and it is working out well.  Objective:   Review of Systems  Constitutional: Negative.     Physical Exam  Constitutional: She is oriented to person, place, and time. She appears well-developed and well-nourished.  Neurological: She is alert and oriented to person, place, and time.  Skin: Skin is warm and dry.     Psychiatric: She has a normal mood and affect. Her behavior is normal. Judgment and thought content normal.   Filed Weights   03/03/15 1041  Weight: 147 lb 14.4 oz (67.087 kg)   Filed Vitals:   03/03/15 1041  BP: 102/72   Current Medications:   Current Outpatient  Prescriptions  Medication Sig Dispense Refill  . aspirin 81 MG EC tablet Take 81 mg by mouth daily.      . Diclofenac Potassium 50 MG PACK Take 50 mg by mouth once as needed. Take once daily as needed with headache onset. Please take with food 9 each 3  . esomeprazole (NEXIUM) 40 MG capsule Take 1 capsule (40 mg total) by mouth daily. 90 capsule 1  . Insulin Glargine (LANTUS SOLOSTAR) 100 UNIT/ML Solostar Pen INJECT 26 UNITS INTO THE SKIN DAILY. 15 mL 1  . metFORMIN (GLUCOPHAGE XR) 500 MG 24 hr tablet Take 2 tablets (1,000 mg total) by mouth 2 (two) times daily with a meal. 120 tablet 5  . pantoprazole (PROTONIX) 40 MG tablet Take 1 tablet (40 mg total) by mouth daily. 90 tablet 1  . rosuvastatin (CRESTOR) 20 MG tablet Take 1 tablet (20 mg total) by mouth daily. 90 tablet 3  . sitaGLIPtin (JANUVIA) 100 MG tablet Take 1 tablet (100 mg total) by mouth daily. 90 tablet 1  . SUMAtriptan (IMITREX) 100 MG tablet Take 1 tablet (100 mg total) by mouth every 2 (two) hours as needed for migraine or headache. May repeat in 2 hours if headache persists or recurs. 10 tablet 5  . topiramate (TOPAMAX) 50 MG tablet Take 1 tablet (50 mg total) by mouth 2 (two) times daily. 60 tablet 5  . TRUEPLUS  LANCETS 30G MISC USE AS DIRECTED 100 each PRN  . TRUETEST TEST strip USE AS DIRECTED ONCE DAILY. 100 each PRN  . UNIFINE PENTIPS 32G X 4 MM MISC USE WITH LANTUS 100 each PRN  . butalbital-acetaminophen-caffeine (FIORICET, ESGIC) 50-325-40 MG per tablet TAKE 1 TABLET BY MOUTH EVERY 6 HOURS AS NEEDED FOR HEADACHE *MAX 1-2 TABLETS PER DAY* 30 tablet 0  . meclizine (ANTIVERT) 12.5 MG tablet Take 1 tablet (12.5 mg total) by mouth 3 (three) times daily as needed for dizziness. (Patient not taking: Reported on 03/03/2015) 30 tablet 1  . methocarbamol (ROBAXIN) 500 MG tablet Take 1 tablet (500 mg total) by mouth 2 (two) times daily as needed for muscle spasms. (Patient not taking: Reported on 03/03/2015) 30 tablet 0  . ondansetron  (ZOFRAN-ODT) 8 MG disintegrating tablet Take 1 tablet (8 mg total) by mouth every 12 (twelve) hours as needed for nausea or vomiting. (Patient not taking: Reported on 03/03/2015) 20 tablet 0   No current facility-administered medications for this visit.    Functional Status:   In your present state of health, do you have any difficulty performing the following activities: 03/03/2015  Hearing? N  Vision? N  Difficulty concentrating or making decisions? N  Walking or climbing stairs? N  Dressing or bathing? N  Doing errands, shopping? N    Fall/Depression Screening:    PHQ 2/9 Scores 03/03/2015 01/09/2015 10/04/2013  PHQ - 2 Score 0 0 0    Assessment:   [employer] employee and Link To Wellness member with Type II DM with chronically elevated blood sugar, most recent  A1C= 10.8% on 01/09/15  Plan:   [facility] CM Care Plan Problem One        Most Recent Value   Care Plan Problem One  Type II DM with chronically elevated blood sugar and A1C with most recent A1C= 10.8% on 01/09/15   Role Documenting the Problem One  Care Management [place] for Problem One  Active   [facility] Long Term Goal (31-90 days)  Improved glycemic control as evidenced by improved A1C at next assessment   [facility] Long Term Goal Start Date  03/03/15   Interventions for Problem One Long Term Goal  reviewed basic pathophysiology of Type II DM and the need for a combination of DM drugs to correct the core deficits and to prevent or slow beta cell failure, identified the location of Dr. Georgie Chard office and since  he is in the same office as Dr. Cruzita Lederer, encouraged Ashlan to make an appointment with Dr Cruzita Lederer while she is there, discussed strategies to treat migraine pain,reviewed A1C results of 7/28, the correlation of A1C to average blood sugar,  reviewed the risks and complications of prolonged elevated glucose levels  on body systems, reviewed upcoming appointments with Dr, Tomi Likens on 9/23, colonoscopy prep pick up on 9/27, and  colonoscopy on 10/11, will arrange for Link To Wellness follow up once [place] has seen Dr. Cruzita Lederer     RNCM to fax today's office visit note to Dr. Jenny Reichmann and Dr. Cruzita Lederer. RNCM will meet quarterly and as needed with patient to assist with Type II DM self-management and assess patient's progress toward mutually set goals.  Barrington Ellison RN,CCM,CDE [place] Management Coordinator Link To Wellness Office Phone [phone] Office Fax [phone]

## 2015-03-07 ENCOUNTER — Ambulatory Visit: Payer: 59 | Admitting: Neurology

## 2015-03-11 ENCOUNTER — Ambulatory Visit (AMBULATORY_SURGERY_CENTER): Payer: Self-pay | Admitting: *Deleted

## 2015-03-11 VITALS — Ht 61.0 in | Wt 150.0 lb

## 2015-03-11 DIAGNOSIS — Z1211 Encounter for screening for malignant neoplasm of colon: Secondary | ICD-10-CM

## 2015-03-11 NOTE — Progress Notes (Signed)
No egg or soy allergy. No anesthesia problems.  No home O2.  No diet meds.  

## 2015-03-24 ENCOUNTER — Telehealth: Payer: Self-pay | Admitting: Internal Medicine

## 2015-03-24 NOTE — Telephone Encounter (Signed)
Returned patient phone call. Patient cancelled procedure for tomorrow due to severe migraine. Stated the only way for her migraine to go away was to eat food. Spoke with Dr. Carlean Purl and he stated that it was okay for her to cancelled.

## 2015-03-25 ENCOUNTER — Encounter: Payer: 59 | Admitting: Internal Medicine

## 2015-03-28 ENCOUNTER — Other Ambulatory Visit: Payer: Self-pay | Admitting: Internal Medicine

## 2015-03-28 NOTE — Telephone Encounter (Signed)
Done hardcopy to Dahlia  

## 2015-04-16 ENCOUNTER — Ambulatory Visit: Payer: 59 | Admitting: Neurology

## 2015-04-29 ENCOUNTER — Ambulatory Visit (INDEPENDENT_AMBULATORY_CARE_PROVIDER_SITE_OTHER): Payer: 59 | Admitting: Internal Medicine

## 2015-04-29 ENCOUNTER — Other Ambulatory Visit (INDEPENDENT_AMBULATORY_CARE_PROVIDER_SITE_OTHER): Payer: 59 | Admitting: *Deleted

## 2015-04-29 ENCOUNTER — Encounter: Payer: Self-pay | Admitting: Internal Medicine

## 2015-04-29 VITALS — BP 106/62 | HR 97 | Temp 97.9°F | Resp 12 | Wt 151.6 lb

## 2015-04-29 DIAGNOSIS — E119 Type 2 diabetes mellitus without complications: Secondary | ICD-10-CM | POA: Diagnosis not present

## 2015-04-29 LAB — POCT GLYCOSYLATED HEMOGLOBIN (HGB A1C): Hemoglobin A1C: 10.9

## 2015-04-29 MED ORDER — GLIPIZIDE 5 MG PO TABS: 5.0000 mg | ORAL_TABLET | Freq: Two times a day (BID) | ORAL | Status: DC

## 2015-04-29 MED ORDER — INSULIN GLARGINE 100 UNIT/ML SOLOSTAR PEN: PEN_INJECTOR | SUBCUTANEOUS | Status: DC

## 2015-04-29 NOTE — Patient Instructions (Addendum)
Please move Metformin ER to 1000 mg with b'fast and 1000 mg with dinner.  Move Lantus in am and continue 36 units every morning.  Continue Januvia 100 mg in am, before b'fast.  Start Glipizide 5 mg 2x a day before b'fast and before dinner.  Check sugars 3x a day, rotating check times.  Please return in 1 month with your sugar log.   Please consider the following ways to cut down carbs and fat and increase fiber and micronutrients in your diet:  - substitute whole grain for white bread or pasta - substitute brown rice for white rice - substitute 90-calorie flatbread pieces for slices of bread when possible - substitute sweet potatoes or yams for white potatoes - substitute humus for margarine - substitute tofu for cheese when possible - substitute almond or rice milk for regular milk - substitute dark chocolate for other sweets when possible - substitute water - can add lemon/orange/lime/kiwi slices for taste - for diet sodas (artificial sweeteners will trick your body that you can eat sweets without getting calories and will lead you to overeating and weight gain in the long run) - do not skip breakfast or other meals (this will slow down the metabolism and will result in more weight gain over time)  - can try smoothies made from fruit and almond/rice milk in am instead of regular breakfast - can also try old-fashioned (not instant) oatmeal made with almond/rice milk in am - order the dressing on the side when eating salad at a restaurant (pour less than half of the dressing on the salad) - eat as little meat as possible  - can try juicing, but should not forget that juicing will get rid of the fiber, so would alternate with eating raw veg./fruits or drinking smoothies - use as little oil as possible, even when using olive oil - can dress a salad with a mix of balsamic vinegar and lemon juice, for e.g. - use agave nectar, stevia sugar, or regular sugar rather than artificial  sweateners - steam or broil/roast veggies  - snack on veggies/fruit/nuts (unsalted, preferably) when possible, rather than processed foods - reduce or eliminate aspartame in diet (it is in diet sodas, chewing gum, etc) Read the labels!  Try to read Dr. Janene Harvey book: "Program for Reversing Diabetes" for the vegan concept and other ideas for healthy eating.

## 2015-04-29 NOTE — Progress Notes (Signed)
Subjective:     Patient ID: Rachel [name], female   DOB: 10/30/1965, 49 y.o.   MRN: [id number]  HPI [name] is a pleasant 49 y.o. woman, returning for f/u for DM2, dx 1987, uncontrolled, insulin-dependent, with probable complications (? peripheral neuropathy). Last visit 9 mo ago.   She is not compliant with appts.   Last HbA1C: Lab Results  Component Value Date   HGBA1C 10.8* 01/09/2015   HGBA1C 12.7* 06/24/2014   HGBA1C 15.1* 10/04/2013  Prev. 10.1%.  She describes coming off all DM medicines for 6 mo before the HbA1c in 09/2013! She had increased thirst and urination and lost weight during this period: from a size 12 to 8! She restarted her meds in 06/2014.  She is on: - Lantus 30 >> 20 units >> 36 in HS (increased this by herself)  - only takes 16 units if she has a salad for dinner.  - Januvia 100 mg in am - Metformin XR 1000 mg 2x a day with b'fast and lunch We stopped Glipizide XL 5 mg in 07/2014. She had episodes of hypoglycemia with Amaryl in the past. She was on Bydureon 2 mg weekly (had nausea).  She checks her sugars ~1-2 a day. Does not bring a log/meter.  - am: 70-120 >> 140-150s >> 50-125 >> 179-259 - before lunch (checks if does not feel good): 80-90s >> 300s (only checks when thirsty) - bedtime: 100-130 >> n/c >> 100-130 >> n/c >> 300-500 She has hypoglycemia awareness in the 90s now. She can have lows at night >> 50. Highest 600.  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   - She has seen nutrition  - Last eye appt: 06/2014 >> reportedly no DR - last lipids: Lab Results  Component Value Date   CHOL 290* 01/09/2015   HDL 60.60 01/09/2015   LDLCALC 198* 01/09/2015   LDLDIRECT 178.8 06/13/2012   TRIG 159.0* 01/09/2015   CHOLHDL 5 01/09/2015  On Simvastatin. - No CKD: Lab Results  Component Value Date   BUN 13 01/09/2015   Lab Results  Component Value Date   CREATININE 0.62 01/09/2015   - Has  neuropathy R leg.   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  Review of Systems Constitutional: + weight gain, + fatigue, no heat or cold intolerance, no excessive urination Eyes: no blurry vision, no xerophthalmia ENT: no sore throat, no nodules palpated in throat, no dysphagia/no odynophagia, no hoarseness Cardiovascular: no CP/no SOB/palpitations/leg swelling Respiratory: no cough/no SOB Gastrointestinal: no N/V/D/C, + heartburn Musculoskeletal: no muscle/joint aches Skin: no rashes Neurological: no tremors/numbness/tingling/dizziness, + HA  I reviewed pt's medications, allergies, PMH, social hx, family hx, and changes were documented in the history of present illness. Otherwise, unchanged from my initial visit note.  Objective:   Physical Exam BP 106/62 mmHg  Pulse 97  Temp(Src) 97.9 F (36.6 C) (Oral)  Resp 12  Wt 151 lb 9.6 oz (68.765 kg)  SpO2 98% Body mass index is 28.66 kg/(m^2). Wt Readings from Last 3 Encounters:  04/29/15 151 lb 9.6 oz (68.765 kg)  03/11/15 150 lb (68.04 kg)  03/03/15 147 lb 14.4 oz (67.087 kg)  Constitutional: overweight, in NAD Eyes: PERRLA, EOMI, no exophthalmos, colored contacts ENT: moist mucous membranes, no thyromegaly, no cervical lymphadenopathy Cardiovascular: tachycardia, RR, No MRG Respiratory: CTA B Gastrointestinal: abdomen soft, NT, ND, BS+ Musculoskeletal: no deformities, strength intact in  all 4 Skin: moist, warm, no rashes  Assessment:     1. DM2, uncontrolled, insulin-dependent, with probable complications - ? peripheral neuropathy  Plan:     Patient with uncontrolled diabetes, noncompliant with sugar checks and visits and I suspect also with medications. Sugars are abysmal. HbA1c today >> 10.9% (still high). We had a long discussion about the absolute need to get in control of her DM, to start coming to the appts and check her sugars. We also reviewed her cholesterol levels >> terrible >> we discussed about improving her  diet and I suggested a plant-based diet and given her references. She refuses a new referral to nutrition, but she does see the DM educator with [facility].  - Will move Lantus in am as she has low CBGs at night, will add Glipizide and also move Metformin at bedtime, from lunchtime, to help with am sugars.  - I advised her to: Patient Instructions  Please move Metformin ER to 1000 mg with b'fast and 1000 mg with dinner.  Move Lantus in am and continue 36 units every morning.  Continue Januvia 100 mg in am, before b'fast.  Start Glipizide 5 mg 2x a day before b'fast and before dinner.  Check sugars 3x a day, rotating check times.  Please return in 1 month with your sugar log.   - continue to check sugars 3x a day - rotate checks - UTD with eye exams - I will see her back in 1 month with her sugar log

## 2015-05-01 ENCOUNTER — Other Ambulatory Visit: Payer: Self-pay | Admitting: Internal Medicine

## 2015-05-02 ENCOUNTER — Other Ambulatory Visit: Payer: Self-pay | Admitting: Internal Medicine

## 2015-05-02 DIAGNOSIS — N6320 Unspecified lump in the left breast, unspecified quadrant: Secondary | ICD-10-CM

## 2015-05-02 NOTE — Telephone Encounter (Signed)
Done hardcopy to Dahlia  

## 2015-05-02 NOTE — Telephone Encounter (Signed)
Rx faxed to pharmacy  

## 2015-05-07 ENCOUNTER — Ambulatory Visit: Payer: Self-pay

## 2015-05-07 ENCOUNTER — Other Ambulatory Visit: Payer: Self-pay | Admitting: Occupational Medicine

## 2015-05-07 DIAGNOSIS — R52 Pain, unspecified: Secondary | ICD-10-CM

## 2015-05-29 ENCOUNTER — Ambulatory Visit (INDEPENDENT_AMBULATORY_CARE_PROVIDER_SITE_OTHER): Payer: 59 | Admitting: Internal Medicine

## 2015-05-29 ENCOUNTER — Encounter: Payer: Self-pay | Admitting: Internal Medicine

## 2015-05-29 VITALS — BP 110/62 | HR 90 | Temp 98.2°F | Resp 12 | Wt 151.0 lb

## 2015-05-29 DIAGNOSIS — E119 Type 2 diabetes mellitus without complications: Secondary | ICD-10-CM | POA: Diagnosis not present

## 2015-05-29 NOTE — Patient Instructions (Signed)
Please move Metformin ER to 1000 mg with b'fast and 1000 mg with dinner. Continue:  - Lantus 36 units every night - Januvia 100 mg in am, before b'fast. - Glipizide 5 mg 2x a day before b'fast and before dinner.  Check sugars 3x a day, rotating check times.  Please return in 2 months with your sugar log.

## 2015-05-29 NOTE — Progress Notes (Signed)
Subjective:     Patient ID: Rachel [name], female   DOB: Mar 29, 1966, 49 y.o.   MRN: [id number]  HPI [name] is a pleasant 49 y.o. woman, returning for f/u for DM2, dx 1987, uncontrolled, insulin-dependent, with probable complications (? peripheral neuropathy). Last visit 1 mo ago.  She was not compliant with appts. In the past.  Last HbA1C: Lab Results  Component Value Date   HGBA1C 10.9 04/29/2015   HGBA1C 10.8* 01/09/2015   HGBA1C 12.7* 06/24/2014  Prev. 10.1%.  She describes coming off all DM medicines for 6 mo before the HbA1c in 09/2013! She had increased thirst and urination and lost weight during this period: from a size 12 to 8! She restarted her meds in 06/2014.  She is on: - Lantus 30 >> 20 units >> 36 at bedtime - Januvia 100 mg in am - Metformin XR 1000 mg 2x a day with b'fast and lunch - Glipizide 5 mg bid - added 04/2015 We stopped Glipizide XL 5 mg in 07/2014. She had episodes of hypoglycemia with Amaryl in the past. She was on Bydureon 2 mg weekly (had nausea).  She checks her sugars ~1-2 a day. She brings her meter: - am: 70-120 >> 140-150s >> 50-125 >> 179-259 >> 49x1, 122-130s, 166 - before lunch (checks if does not feel good): 80-90s >> 300s (only checks when thirsty) >> n/c - bedtime: 100-130 >> n/c >> 100-130 >> n/c >> 300-500 >> 123-150s, 190 (rice) She has hypoglycemia awareness in the 90s now. She can have lows at night >> 50. Highest 600 >> 190.  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   - She has seen nutrition  - Last eye appt: 06/2014 >> reportedly no DR - last lipids: Lab Results  Component Value Date   CHOL 290* 01/09/2015   HDL 60.60 01/09/2015   LDLCALC 198* 01/09/2015   LDLDIRECT 178.8 06/13/2012   TRIG 159.0* 01/09/2015   CHOLHDL 5 01/09/2015  On Simvastatin. - No CKD: Lab Results  Component Value Date   BUN 13 01/09/2015   Lab Results  Component Value Date   CREATININE 0.62  01/09/2015   - Has neuropathy R leg.   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  Review of Systems Constitutional: no weight gain, no fatigue, no heat or cold intolerance, no excessive urination Eyes: no blurry vision, no xerophthalmia ENT: no sore throat, no nodules palpated in throat, no dysphagia/no odynophagia, no hoarseness Cardiovascular: no CP/no SOB/palpitations/leg swelling Respiratory: no cough/no SOB Gastrointestinal: no N/V/D/C/heartburn Musculoskeletal: no muscle/joint aches Skin: no rashes Neurological: no tremors/numbness/tingling/dizziness, + HA  I reviewed pt's medications, allergies, PMH, social hx, family hx, and changes were documented in the history of present illness. Otherwise, unchanged from my initial visit note.  Objective:   Physical Exam BP 110/62 mmHg  Pulse 90  Temp(Src) 98.2 F (36.8 C) (Oral)  Resp 12  Wt 151 lb (68.493 kg)  SpO2 99% Body mass index is 28.55 kg/(m^2). Wt Readings from Last 3 Encounters:  05/29/15 151 lb (68.493 kg)  04/29/15 151 lb 9.6 oz (68.765 kg)  03/11/15 150 lb (68.04 kg)  Constitutional: overweight, in NAD Eyes: PERRLA, EOMI, no exophthalmos, colored contacts ENT: moist mucous membranes, no thyromegaly, no cervical lymphadenopathy Cardiovascular: tachycardia, RR, No MRG Respiratory: CTA B Gastrointestinal: abdomen soft, NT, ND, BS+ Musculoskeletal: no deformities, strength intact in all 4 Skin: moist, warm,  no rashes  Assessment:     1. DM2, uncontrolled, insulin-dependent, with probable complications - ? peripheral neuropathy  Plan:     Patient with uncontrolled diabetes, noncompliant with sugar checks and visits and I suspect also with medications. HbA1c at last visit was very high, 10.9% and we had a long discussion about the absolute need to get in control of her DM, to start coming to the appts and check her sugars. At this visit, sugars are radically better! Sugars in am are a little higher than target >>  will move lunchtime Metformin at dinner as she did not do this after last visit. - I advised her to: Patient Instructions  Please move Metformin ER to 1000 mg with b'fast and 1000 mg with dinner. Continue:  - Lantus 36 units every night - Januvia 100 mg in am, before b'fast. - Glipizide 5 mg 2x a day before b'fast and before dinner.  Check sugars 3x a day, rotating check times.  Please return in 2 months with your sugar log.   - continue to check sugars 3x a day - rotate checks - UTD with eye exams - I will see her back in 2 months with her sugar log

## 2015-06-03 ENCOUNTER — Ambulatory Visit
Admission: RE | Admit: 2015-06-03 | Discharge: 2015-06-03 | Disposition: A | Discharge status: Home / Self Care | Payer: 59 | Source: Ambulatory Visit | Attending: Internal Medicine | Admitting: Internal Medicine

## 2015-06-03 DIAGNOSIS — N63 Unspecified lump in unspecified breast: Secondary | ICD-10-CM

## 2015-06-03 DIAGNOSIS — N6320 Unspecified lump in the left breast, unspecified quadrant: Secondary | ICD-10-CM

## 2015-06-10 ENCOUNTER — Ambulatory Visit: Payer: Self-pay

## 2015-06-10 ENCOUNTER — Other Ambulatory Visit: Payer: Self-pay | Admitting: Occupational Medicine

## 2015-06-10 DIAGNOSIS — M549 Dorsalgia, unspecified: Secondary | ICD-10-CM

## 2015-06-11 ENCOUNTER — Other Ambulatory Visit: Payer: Self-pay | Admitting: Internal Medicine

## 2015-06-11 NOTE — Telephone Encounter (Signed)
.  one Done hardcopy to Dahlia  

## 2015-06-11 NOTE — Telephone Encounter (Signed)
Rx faxed to pharmacy  

## 2015-06-19 ENCOUNTER — Other Ambulatory Visit: Payer: Self-pay | Admitting: Internal Medicine

## 2015-07-01 ENCOUNTER — Other Ambulatory Visit: Payer: Self-pay | Admitting: Nurse Practitioner

## 2015-07-01 ENCOUNTER — Other Ambulatory Visit: Payer: Self-pay | Admitting: Internal Medicine

## 2015-07-01 DIAGNOSIS — Z7689 Persons encountering health services in other specified circumstances: Secondary | ICD-10-CM

## 2015-07-01 DIAGNOSIS — G44209 Tension-type headache, unspecified, not intractable: Secondary | ICD-10-CM

## 2015-07-01 MED ORDER — BUTALBITAL-APAP-CAFFEINE 50-325-40 MG PO TABS: 1.0000 | ORAL_TABLET | Freq: Four times a day (QID) | ORAL | Status: DC | PRN

## 2015-07-01 NOTE — Addendum Note (Signed)
Addended by: Biagio Borg on: 07/01/2015 06:50 PM   Modules accepted: Orders

## 2015-07-01 NOTE — Telephone Encounter (Addendum)
Done hardcopy to [employer]

## 2015-07-01 NOTE — Telephone Encounter (Signed)
Done hardcopy to [employer]

## 2015-07-02 NOTE — Telephone Encounter (Signed)
Rx faxed to pharmacy  

## 2015-07-09 ENCOUNTER — Other Ambulatory Visit (INDEPENDENT_AMBULATORY_CARE_PROVIDER_SITE_OTHER): Payer: 59

## 2015-07-09 ENCOUNTER — Telehealth: Payer: Self-pay

## 2015-07-09 ENCOUNTER — Ambulatory Visit (INDEPENDENT_AMBULATORY_CARE_PROVIDER_SITE_OTHER): Payer: 59 | Admitting: Internal Medicine

## 2015-07-09 ENCOUNTER — Encounter: Payer: Self-pay | Admitting: Internal Medicine

## 2015-07-09 VITALS — BP 112/76 | HR 104 | Temp 98.3°F | Resp 20 | Ht 61.0 in | Wt 153.2 lb

## 2015-07-09 DIAGNOSIS — R1314 Dysphagia, pharyngoesophageal phase: Secondary | ICD-10-CM | POA: Diagnosis not present

## 2015-07-09 DIAGNOSIS — R7989 Other specified abnormal findings of blood chemistry: Secondary | ICD-10-CM

## 2015-07-09 DIAGNOSIS — M545 Low back pain, unspecified: Secondary | ICD-10-CM

## 2015-07-09 DIAGNOSIS — G43009 Migraine without aura, not intractable, without status migrainosus: Secondary | ICD-10-CM | POA: Diagnosis not present

## 2015-07-09 DIAGNOSIS — Z0000 Encounter for general adult medical examination without abnormal findings: Secondary | ICD-10-CM

## 2015-07-09 DIAGNOSIS — Z23 Encounter for immunization: Secondary | ICD-10-CM | POA: Diagnosis not present

## 2015-07-09 LAB — HEPATIC FUNCTION PANEL
ALT: 13 U/L (ref 0–35)
AST: 13 U/L (ref 0–37)
Albumin: 4.3 g/dL (ref 3.5–5.2)
Alkaline Phosphatase: 71 U/L (ref 39–117)
Bilirubin, Direct: 0 mg/dL (ref 0.0–0.3)
Total Bilirubin: 0.4 mg/dL (ref 0.2–1.2)
Total Protein: 7.3 g/dL (ref 6.0–8.3)

## 2015-07-09 LAB — CBC WITH DIFFERENTIAL/PLATELET
Basophils Absolute: 0 10*3/uL (ref 0.0–0.1)
Basophils Relative: 0.6 % (ref 0.0–3.0)
Eosinophils Absolute: 0 10*3/uL (ref 0.0–0.7)
Eosinophils Relative: 0.4 % (ref 0.0–5.0)
HCT: 36.6 % (ref 36.0–46.0)
Hemoglobin: 12 g/dL (ref 12.0–15.0)
Lymphocytes Relative: 45.8 % (ref 12.0–46.0)
Lymphs Abs: 4 10*3/uL (ref 0.7–4.0)
MCHC: 32.8 g/dL (ref 30.0–36.0)
MCV: 80.4 fl (ref 78.0–100.0)
Monocytes Absolute: 0.7 10*3/uL (ref 0.1–1.0)
Monocytes Relative: 8 % (ref 3.0–12.0)
Neutro Abs: 4 10*3/uL (ref 1.4–7.7)
Neutrophils Relative %: 45.2 % (ref 43.0–77.0)
Platelets: 406 10*3/uL — ABNORMAL HIGH (ref 150.0–400.0)
RBC: 4.55 Mil/uL (ref 3.87–5.11)
RDW: 13.7 % (ref 11.5–15.5)
WBC: 8.8 10*3/uL (ref 4.0–10.5)

## 2015-07-09 LAB — TSH: TSH: 0.76 u[IU]/mL (ref 0.35–4.50)

## 2015-07-09 LAB — LIPID PANEL
Cholesterol: 236 mg/dL — ABNORMAL HIGH (ref 0–200)
HDL: 55 mg/dL (ref >39.00)
NonHDL: 180.99
Total CHOL/HDL Ratio: 4
Triglycerides: 332 mg/dL — ABNORMAL HIGH (ref 0.0–149.0)
VLDL: 66.4 mg/dL — ABNORMAL HIGH (ref 0.0–40.0)

## 2015-07-09 LAB — BASIC METABOLIC PANEL
BUN: 11 mg/dL (ref 6–23)
CO2: 26 mEq/L (ref 19–32)
Calcium: 9.4 mg/dL (ref 8.4–10.5)
Chloride: 99 mEq/L (ref 96–112)
Creatinine, Ser: 0.75 mg/dL (ref 0.40–1.20)
GFR: 86.54 mL/min (ref >60.00)
Glucose, Bld: 302 mg/dL — ABNORMAL HIGH (ref 70–99)
Potassium: 3.9 mEq/L (ref 3.5–5.1)
Sodium: 135 mEq/L (ref 135–145)

## 2015-07-09 LAB — URINALYSIS, ROUTINE W REFLEX MICROSCOPIC
Bilirubin Urine: NEGATIVE
Hgb urine dipstick: NEGATIVE
Ketones, ur: NEGATIVE
Nitrite: NEGATIVE
RBC / HPF: NONE SEEN (ref 0–?)
Specific Gravity, Urine: 1.01 (ref 1.000–1.030)
Total Protein, Urine: NEGATIVE
Urine Glucose: >=1000 — AB
Urobilinogen, UA: 0.2 (ref 0.0–1.0)
pH: 6.5 (ref 5.0–8.0)

## 2015-07-09 LAB — LDL CHOLESTEROL, DIRECT: Direct LDL: 149 mg/dL

## 2015-07-09 MED ORDER — TOPIRAMATE 50 MG PO TABS: 50.0000 mg | ORAL_TABLET | Freq: Two times a day (BID) | ORAL | Status: DC

## 2015-07-09 MED ORDER — MELOXICAM 15 MG PO TABS: 15.0000 mg | ORAL_TABLET | Freq: Every day | ORAL | Status: DC | PRN

## 2015-07-09 NOTE — Progress Notes (Signed)
Pre visit review using our clinic review tool, if applicable. No additional management support is needed unless otherwise documented below in the visit note. 

## 2015-07-09 NOTE — Progress Notes (Signed)
Subjective:    Patient ID: Rachel [name], female    DOB: [birth year], 50 y.o.   MRN: [id number]  HPI    Here for wellness and f/u;  Overall doing ok;  Pt denies Chest pain, worsening SOB, DOE, wheezing, orthopnea, PND, worsening LE edema, palpitations, dizziness or syncope.  Pt denies neurological change such as new headache, facial or extremity weakness.  Pt denies polydipsia, polyuria, or low sugar symptoms. Pt states overall good compliance with treatment and medications, good tolerability, and has been trying to follow appropriate diet.  Pt denies worsening depressive symptoms, suicidal ideation or panic. No fever, night sweats, wt loss, loss of appetite, or other constitutional symptoms.  Pt states good ability with ADL's, has low fall risk, home safety reviewed and adequate, no other significant changes in hearing or vision, and only occasionally active with exercise.  Doing better with sugars it seems per endo with next a1c due feb 2017.  [place] backwards at work, but returned to work, still having pain but not considered workmans comp as she was able to go back to work. Muscle relaxer and heating pad not helpind   Missed Gi appt, still with dysphagia as before., needs repeat referral.  Stil with fairly freq migraines requiring meds tx Past Medical History  Diagnosis Date  . DIABETES MELLITUS, TYPE II 08/02/2007  . HYPERLIPIDEMIA 08/02/2007  . COMMON MIGRAINE 05/15/2009  . ALLERGIC RHINITIS 10/04/2007  . GERD 08/02/2007  . INTERMITTENT VERTIGO 05/15/2009  . INSOMNIA-SLEEP DISORDER-UNSPEC 01/04/2008  . LIBIDO, DECREASED 01/09/2010  . Anemia 01/21/2011  . ASTHMA 08/02/2007    INHALERS ONLY IN [place]  . Blood transfusion without reported diagnosis     with first child    Past Surgical History  Procedure Laterality Date  . Cesarean section      x2  . S/p left knee surgury    . Parotidectomy  10/21/2011    tumor removed behind hear drum  . Parotidectomy  10/21/2011    Procedure: PAROTIDECTOMY;   Surgeon: Melida Quitter, MD;  Location: [place];  Service: ENT;  Laterality: Left;    reports that she has never smoked. She has never used smokeless tobacco. She reports that she does not drink alcohol or use illicit drugs. family history includes Asthma in her father; Cancer in her other; Diabetes in her father; Hypertension in her father and mother. There is no history of Anesthesia problems or Colon cancer. Allergies  Allergen Reactions  . Atorvastatin     REACTION: myalgias  . Nitroglycerin    Current Outpatient Prescriptions on File Prior to Visit  Medication Sig Dispense Refill  . aspirin 81 MG EC tablet Take 81 mg by mouth daily.      . butalbital-acetaminophen-caffeine (FIORICET, ESGIC) 50-325-40 MG tablet Take 1 tablet by mouth every 6 (six) hours as needed for headache. 30 tablet 0  . Diclofenac Potassium 50 MG PACK Take 50 mg by mouth once as needed. Take once daily as needed with headache onset. Please take with food 9 each 3  . esomeprazole (NEXIUM) 40 MG capsule Take 1 capsule (40 mg total) by mouth daily. 90 capsule 1  . glipiZIDE (GLUCOTROL) 5 MG tablet Take 1 tablet (5 mg total) by mouth 2 (two) times daily before a meal. 60 tablet 3  . Insulin Glargine (LANTUS SOLOSTAR) 100 UNIT/ML Solostar Pen Inject 36 Units into the skin daily at 10 pm. 15 mL 2  . metFORMIN (GLUCOPHAGE XR) 500 MG 24 hr tablet Take 2  tablets (1,000 mg total) by mouth 2 (two) times daily with a meal. 120 tablet 5  . methocarbamol (ROBAXIN) 500 MG tablet Take 1 tablet (500 mg total) by mouth 2 (two) times daily as needed for muscle spasms. 30 tablet 0  . ondansetron (ZOFRAN-ODT) 8 MG disintegrating tablet Take 1 tablet (8 mg total) by mouth every 12 (twelve) hours as needed for nausea or vomiting. 20 tablet 0  . pantoprazole (PROTONIX) 40 MG tablet Take 40 mg by mouth daily.    . rosuvastatin (CRESTOR) 20 MG tablet Take 1 tablet (20 mg total) by mouth daily. 90 tablet 3  . sitaGLIPtin (JANUVIA) 100 MG tablet  Take 1 tablet (100 mg total) by mouth daily. 90 tablet 1  . SUMAtriptan (IMITREX) 100 MG tablet Take 1 tablet (100 mg total) by mouth every 2 (two) hours as needed for migraine or headache. May repeat in 2 hours if headache persists or recurs. 10 tablet 5  . TRUEPLUS LANCETS 30G MISC USE AS DIRECTED 100 each PRN  . TRUETEST TEST test strip USE AS DIRECTED ONCE DAILY. 100 each PRN  . UNIFINE PENTIPS 32G X 4 MM MISC USE WITH LANTUS 100 each PRN   No current facility-administered medications on file prior to visit.     Review of Systems Constitutional: Negative for increased diaphoresis, other activity, appetite or siginficant weight change other than noted HENT: Negative for worsening hearing loss, ear pain, facial swelling, mouth sores and neck stiffness.   Eyes: Negative for other worsening pain, redness or visual disturbance.  Respiratory: Negative for shortness of breath and wheezing  Cardiovascular: Negative for chest pain and palpitations.  Gastrointestinal: Negative for diarrhea, blood in stool, abdominal distention or other pain Genitourinary: Negative for hematuria, flank pain or change in urine volume.  Musculoskeletal: Negative for myalgias or other joint complaints.  Skin: Negative for color change and wound or drainage.  Neurological: Negative for syncope and numbness. other than noted Hematological: Negative for adenopathy. or other swelling Psychiatric/Behavioral: Negative for hallucinations, SI, self-injury, decreased concentration or other worsening agitation.      Objective:   Physical Exam BP 112/76 mmHg  Pulse 104  Temp(Src) 98.3 F (36.8 C) (Oral)  Resp 20  Ht 5\' 1"  (1.549 m)  Wt 153 lb 4 oz (69.514 kg)  BMI 28.97 kg/m2  SpO2 98% VS noted,  Constitutional: Pt is oriented to person, place, and time. Appears well-developed and well-nourished, in no significant distress Head: Normocephalic and atraumatic.  Right Ear: External ear normal.  Left Ear: External ear  normal.  Nose: Nose normal.  Mouth/Throat: Oropharynx is clear and moist.  Eyes: Conjunctivae and EOM are normal. Pupils are equal, round, and reactive to light.  Neck: Normal range of motion. Neck supple. No JVD present. No tracheal deviation present or significant neck LA or mass Cardiovascular: Normal rate, regular rhythm, normal heart sounds and intact distal pulses.   Pulmonary/Chest: Effort normal and breath sounds without rales or wheezing  Abdominal: Soft. Bowel sounds are normal. NT. No HSM  Musculoskeletal: Normal range of motion. Exhibits no edema.  Lymphadenopathy:  Has no cervical adenopathy.  Neurological: Pt is alert and oriented to person, place, and time. Pt has normal reflexes. No cranial nerve deficit. Motor grossly intact Skin: Skin is warm and dry. No rash noted.  Psychiatric:  Has normal mood and affect. Behavior is normal.  Spine diffuse tender with bilat tender muscular areas bilat paravertebral    Assessment & Plan:

## 2015-07-09 NOTE — Assessment & Plan Note (Signed)
Pt reqeusts referral to neurology for better control, cont same tx for now

## 2015-07-09 NOTE — Telephone Encounter (Signed)
Patients pharmacy sent over fax stating that she requested a refill of pantoprazole 40mg . She is also taking Nexium. Do you want her on both of these. Please advise.

## 2015-07-09 NOTE — Telephone Encounter (Addendum)
Protonix was discontinued per GI 03/11/2015, pharmacy advised of same

## 2015-07-09 NOTE — Patient Instructions (Addendum)
You had the Prevnar pneumonia shot today  Please take all new medication as prescribed - the anti-inflammatory for pain  Please continue all other medications as before, and refills have been done if requested.  Please have the pharmacy call with any other refills you may need.  Please continue your efforts at being more active, low cholesterol diet, and weight control.  You are otherwise up to date with prevention measures today.  Please keep your appointments with your specialists as you may have planned  You will be contacted regarding the referral for: Gastroenterology, neurology and Sports medicine  Please go to the LAB in the Basement (turn left off the elevator) for the tests to be done today  You will be contacted by phone if any changes need to be made immediately.  Otherwise, you will receive a letter about your results with an explanation, but please check with MyChart first.  Please remember to sign up for MyChart if you have not done so, as this will be important to you in the future with finding out test results, communicating by private email, and scheduling acute appointments online when needed.  Please return in 6 months, or sooner if needed

## 2015-07-09 NOTE — Assessment & Plan Note (Signed)
With persistent symptoms, ok for referral GI,  to f/u any worsening symptoms or concerns

## 2015-07-09 NOTE — Assessment & Plan Note (Signed)

## 2015-07-09 NOTE — Assessment & Plan Note (Signed)
Diffuse, ? FMS, pt reqests sport med referral, ok for nsaid prn,  to f/u any worsening symptoms or concerns

## 2015-07-09 NOTE — Addendum Note (Signed)
Addended by: Della Goo C on: 07/09/2015 02:25 PM   Modules accepted: Orders

## 2015-07-10 ENCOUNTER — Other Ambulatory Visit: Payer: Self-pay | Admitting: Internal Medicine

## 2015-07-10 MED ORDER — ROSUVASTATIN CALCIUM 40 MG PO TABS: 40.0000 mg | ORAL_TABLET | Freq: Every day | ORAL | Status: DC

## 2015-07-16 ENCOUNTER — Other Ambulatory Visit: Payer: Self-pay | Admitting: *Deleted

## 2015-07-18 NOTE — Patient Outreach (Addendum)
[facility]) Care Management   07/16/15  Rachel [name] March 20, 1966 [id number]  Rachel [name] is an 50 y.o. female who presents to the [place] [facility] office for routine Link To Wellness follow up for self management assistance with Type II DM  and hyperlipidemia.  Subjective:  Rachel [name] says she is happy that she has lost weight and has been successful in keeping it off. She also says she has been dealing with chronic back pain (reports pain starts at nape of neck and extends to lower back) since she fell at work on 05/07/15 and injured her back. Says she was denied workman's compensation because she came back to work too early. She said she had to return to work because she used all of her paid annual leave (PAL) time. She says she will see a sports medicine doctor this month to address her back pain.She says he will see a gastroenterologist for new symptoms of difficulty swallowing. She also has an appointment with Dr. Cruzita Lederer this month.  She was not able to have her colonoscopy in October because she had a migraine on the day it was scheduled. She says her daughter, who lives locally,  is expecting a baby on 11/14/15.  Objective:   Review of Systems  Constitutional: Negative.     Physical Exam  Constitutional: She is oriented to person, place, and time. She appears well-developed and well-nourished.  Respiratory: Effort normal.  Neurological: She is alert and oriented to person, place, and time.  Skin: Skin is warm and dry.  Psychiatric: She has a normal mood and affect. Her behavior is normal. Judgment and thought content normal.   Filed Vitals:   07/16/15 1000  BP: 102/72   Filed Weights   07/16/15 1000  Weight: 152 lb 6.4 oz (69.128 kg)    Current Medications:   Current Outpatient Prescriptions  Medication Sig Dispense Refill  . aspirin 81 MG EC tablet Take 81 mg by mouth daily.      Marland Kitchen esomeprazole (NEXIUM) 40 MG capsule  Take 1 capsule (40 mg total) by mouth daily. 90 capsule 1  . glipiZIDE (GLUCOTROL) 5 MG tablet Take 1 tablet (5 mg total) by mouth 2 (two) times daily before a meal. 60 tablet 3  . Insulin Glargine (LANTUS SOLOSTAR) 100 UNIT/ML Solostar Pen Inject 36 Units into the skin daily at 10 pm. 15 mL 2  . metFORMIN (GLUCOPHAGE XR) 500 MG 24 hr tablet Take 2 tablets (1,000 mg total) by mouth 2 (two) times daily with a meal. 120 tablet 5  . methocarbamol (ROBAXIN) 500 MG tablet Take 1 tablet (500 mg total) by mouth 2 (two) times daily as needed for muscle spasms. 30 tablet 0  . ondansetron (ZOFRAN-ODT) 8 MG disintegrating tablet Take 1 tablet (8 mg total) by mouth every 12 (twelve) hours as needed for nausea or vomiting. 20 tablet 0  . rosuvastatin (CRESTOR) 40 MG tablet Take 1 tablet (40 mg total) by mouth daily. 90 tablet 3  . sitaGLIPtin (JANUVIA) 100 MG tablet Take 1 tablet (100 mg total) by mouth daily. 90 tablet 1  . SUMAtriptan (IMITREX) 100 MG tablet Take 1 tablet (100 mg total) by mouth every 2 (two) hours as needed for migraine or headache. May repeat in 2 hours if headache persists or recurs. 10 tablet 5  . topiramate (TOPAMAX) 50 MG tablet Take 1 tablet (50 mg total) by mouth 2 (two) times daily. 60 tablet 5  . TRUEPLUS  LANCETS 30G MISC USE AS DIRECTED 100 each PRN  . TRUETEST TEST test strip USE AS DIRECTED ONCE DAILY. 100 each PRN  . UNIFINE PENTIPS 32G X 4 MM MISC USE WITH LANTUS 100 each PRN  . butalbital-acetaminophen-caffeine (FIORICET, ESGIC) 50-325-40 MG tablet Take 1 tablet by mouth every 6 (six) hours as needed for headache. (Patient not taking: Reported on 07/16/2015) 30 tablet 0  . Diclofenac Potassium 50 MG PACK Take 50 mg by mouth once as needed. Take once daily as needed with headache onset. Please take with food (Patient not taking: Reported on 07/16/2015) 9 each 3  . meloxicam (MOBIC) 15 MG tablet Take 1 tablet (15 mg total) by mouth daily as needed for pain. (Patient not taking:  Reported on 07/16/2015) 90 tablet 1  . pantoprazole (PROTONIX) 40 MG tablet Take 40 mg by mouth daily. Reported on 07/16/2015     No current facility-administered medications for this visit.    Functional Status:   In your present state of health, do you have any difficulty performing the following activities: 07/16/2015 03/03/2015  Hearing? N N  Vision? N N  Difficulty concentrating or making decisions? N N  Walking or climbing stairs? N N  Dressing or bathing? N N  Doing errands, shopping? N N    Fall/Depression Screening:    PHQ 2/9 Scores 07/16/2015 03/03/2015 01/09/2015 10/04/2013  PHQ - 2 Score 0 0 0 0    Assessment:   [employer] employee and Link To Wellness member with Type II DM and hyperlipidemia not meeting treatment targets as evidenced by Hgb A1C= 10.9% on 04/29/15 and elevated total cholesterol and triglycerides on 07/09/15.  Plan:  [facility] CM Care Plan Problem One        Most Recent Value   Care Plan Problem One  Type II DM and hyperlipidemia with chronically elevated blood sugar and Hgb A1C with most recent Hgb A1C= 10.9% (Hgb A1C was 10.8% on 01/09/15), lipid profile done 07/09/15 showed elevated triglycerides and total cholesterol    Role Documenting the Problem One  Care Management Coordinator   Care Plan for Problem One  Active   [facility] Long Term Goal (31-90 days)  Improved glycemic control as evidenced by improved Hgb A1C , at least <10%, and improved lipid profile at next assessment   [facility] Long Term Goal Start Date  07/16/15   Interventions for Problem One Long Term Goal  updated health history to reflect acute back injury that occurred at work on 05/07/15, discussed trial of over the counter TENS unit to treat back pain since she does not want to take pain medicine, reviewed current medications and adherence, reviewed lab results of 07/09/15 including lipid profile, CBC, liver functions and changes to her medication regimen as a result of the labs including the increase in dose of  statin, also reviewed strategies to reduce triglycerides and total cholesterol,  reviewed Hgb A1C results of 04/29/15 and reviewed the risks and complications of prolonged elevated glucose levels on body systems, reviewed upcoming appointments with Dr, [name] (sports medicine for treatment of ongoing back pain related to back injury), Dr.Gherghe (endocrinologist), Dr. Tomi Likens for ongoing migraine issues, and Dr. Darden Dates for dysphagia issues, will arrange for Link To Wellness follow up in 3-4 months        RNCM to fax today's office visit note to Dr. Jenny Reichmann. RNCM will meet quarterly and as needed with patient per Link To Wellness program guidelines to assist with Type II DM  and hyperlipidemia  self-management and assess patient's progress toward mutually set goals.  Barrington Ellison RN,CCM,CDE [place] Management Coordinator Link To Wellness Office Phone [phone] Office Fax [phone]

## 2015-07-29 ENCOUNTER — Encounter: Payer: Self-pay | Admitting: Family Medicine

## 2015-07-29 ENCOUNTER — Ambulatory Visit (INDEPENDENT_AMBULATORY_CARE_PROVIDER_SITE_OTHER): Payer: 59 | Admitting: Family Medicine

## 2015-07-29 VITALS — BP 114/76 | HR 91 | Wt 149.0 lb

## 2015-07-29 DIAGNOSIS — M9903 Segmental and somatic dysfunction of lumbar region: Secondary | ICD-10-CM

## 2015-07-29 DIAGNOSIS — M545 Low back pain, unspecified: Secondary | ICD-10-CM

## 2015-07-29 DIAGNOSIS — M549 Dorsalgia, unspecified: Secondary | ICD-10-CM | POA: Diagnosis not present

## 2015-07-29 DIAGNOSIS — M9904 Segmental and somatic dysfunction of sacral region: Secondary | ICD-10-CM | POA: Diagnosis not present

## 2015-07-29 DIAGNOSIS — M5412 Radiculopathy, cervical region: Secondary | ICD-10-CM

## 2015-07-29 DIAGNOSIS — M9902 Segmental and somatic dysfunction of thoracic region: Secondary | ICD-10-CM

## 2015-07-29 DIAGNOSIS — M999 Biomechanical lesion, unspecified: Secondary | ICD-10-CM | POA: Insufficient documentation

## 2015-07-29 HISTORY — DX: Biomechanical lesion, unspecified: M99.9

## 2015-07-29 MED ORDER — KETOROLAC TROMETHAMINE 60 MG/2ML IM SOLN
60.0000 mg | Freq: Once | INTRAMUSCULAR | Status: AC
  Administered 2015-07-29: 60 mg via INTRAMUSCULAR

## 2015-07-29 MED ORDER — METHYLPREDNISOLONE ACETATE 80 MG/ML IJ SUSP
80.0000 mg | Freq: Once | INTRAMUSCULAR | Status: AC
  Administered 2015-07-29: 80 mg via INTRAMUSCULAR

## 2015-07-29 NOTE — Patient Instructions (Signed)
Good to see you.  2 injections today  Ice 20 minutes 2 times daily. Usually after activity and before bed. Duexis 3 times daiy for next 6 days.  Stop the meloxicam for now.  On wall with heels, butt shoulder and head touching for a goal of 5 minutes daily  2 tennis  Ball in tube sock and place them under the head where it meets the neck and lay on them for 5 minutes a night Exercises 3 times a week.  Vitamin D 2000 IU daily Turmeric 500mg  twice daily  See me again in 3 weeks

## 2015-07-29 NOTE — Progress Notes (Addendum)
[name] [address], [place] [id number] Phone: [phone] Subjective:    I'm seeing this patient by the request  of:  Cathlean Cower, MD   CC: Neck and lower back pain  [id number] Rachel [name] is a 50 y.o. female coming in with complaint of neck and lower back pain. Patient states back on November 23 she did fall at work. Had significant amount of pain where she hit her head against the wall. Did not lose any consciousness. Was seen initially. Patient did have x-rays at that time that were independently visualized by me showing only some mild degenerative disc disease at C5-C6. Patient states since then she's had increasing stiffness of her neck as well as back. Finds it difficult to get through a full day. Has tried such things as anti-inflammatories as well as muscle relaxers with minimal improvement. States that even at night it can be uncomfortable. No radiation to the legs or arms. No weakness. Rates the severity pain 8 out of 10 and possibly worsening overall. Patient states that this can sometimes give her headaches. Another Provider Recently.Patient did get x-rays that were independently visualized by me. X-rays taken 06/10/2015 show no significant bony abnormality.     Past Medical History  Diagnosis Date  . DIABETES MELLITUS, TYPE II 08/02/2007  . HYPERLIPIDEMIA 08/02/2007  . COMMON MIGRAINE 05/15/2009  . ALLERGIC RHINITIS 10/04/2007  . GERD 08/02/2007  . INTERMITTENT VERTIGO 05/15/2009  . INSOMNIA-SLEEP DISORDER-UNSPEC 01/04/2008  . LIBIDO, DECREASED 01/09/2010  . Anemia 01/21/2011  . ASTHMA 08/02/2007    INHALERS ONLY IN [place]  . Blood transfusion without reported diagnosis     with first child    Past Surgical History  Procedure Laterality Date  . Cesarean section      x2  . S/p left knee surgury    . Parotidectomy  10/21/2011    tumor removed behind hear drum  . Parotidectomy  10/21/2011    Procedure: PAROTIDECTOMY;  Surgeon: Melida Quitter, MD;  Location: [facility] OR;  Service: ENT;  Laterality: Left;   Social History   Social History  . Marital Status: Married    Spouse Name: [name]  . Number of Children: 3  . Years of Education: Degree   Occupational History  . NURSE TECH   . [employer]   Social History Main Topics  . Smoking status: Never Smoker   . Smokeless tobacco: Never Used  . Alcohol Use: No  . Drug Use: No  . Sexual Activity: Yes   Other Topics Concern  . None   Social History Narrative   She has 3 daughters.   Patient has a 4 year degree.    Patient working at [employer].    Patient is married to [place].   Allergies  Allergen Reactions  . Atorvastatin     REACTION: myalgias  . Nitroglycerin    Family History  Problem Relation Age of Onset  . Hypertension Mother   . Hypertension Father   . Diabetes Father   . Asthma Father   . Cancer Other     cervical cancer  . Anesthesia problems Neg Hx   . Colon cancer Neg Hx     Past medical history, social, surgical and family history all reviewed in electronic medical record.  No pertanent information unless stated regarding to the chief complaint.   Review of Systems: No headache, visual changes, nausea, vomiting, diarrhea, constipation, dizziness, abdominal pain, skin rash,  fevers, chills, night sweats, weight loss, swollen lymph nodes, body aches, joint swelling, muscle aches, chest pain, shortness of breath, mood changes.   Objective Blood pressure 114/76, pulse 91, weight 149 lb (67.586 kg), SpO2 99 %.  General: No apparent distress alert and oriented x3 mood and affect normal, dressed appropriately.  HEENT: Pupils equal, extraocular movements intact  Respiratory: Patient's speak in full sentences and does not appear short of breath  Cardiovascular: No lower extremity edema, non tender, no erythema  Skin: Warm dry intact with no signs of infection or rash on extremities or on axial skeleton.  Abdomen: Soft nontender  Neuro:  Cranial nerves II through XII are intact, neurovascularly intact in all extremities with 2+ DTRs and 2+ pulses.  Lymph: No lymphadenopathy of posterior or anterior cervical chain or axillae bilaterally.  Gait normal with good balance and coordination.  MSK:  Non tender with full range of motion and good stability and symmetric strength and tone of shoulders, elbows, wrist, hip, knee and ankles bilaterally.  Neck: Inspection unremarkable. No palpable stepoffs. Negative Spurling's maneuver. Full neck range of motion Grip strength and sensation normal in bilateral hands Strength good C4 to T1 distribution No sensory change to C4 to T1 Negative Hoffman sign bilaterally Reflexes normal  Back Exam:  Inspection: Unremarkable  Motion: Flexion 25 deg, Extension 15 deg, Side Bending to 25 deg bilaterally,  Rotation to 25 deg bilaterally significant stiffness overall SLR laying: Negative  XSLR laying: Negative  Palpable tenderness:  Diffuse tenderness of the paraspinal musculature of the upper thoracic [place] to the lower back. Severe pain over the right SI joint FABER: Right sided positive Sensory change: Gross sensation intact to all lumbar and sacral dermatomes.  Reflexes: 2+ at both patellar tendons, 2+ at achilles tendons, Babinski's downgoing.  Strength at foot  Plantar-flexion: 5/5 Dorsi-flexion: 5/5 Eversion: 5/5 Inversion: 5/5  Leg strength  Quad: 5/5 Hamstring: 5/5 Hip flexor: 5/5 Hip abductors: 5/5  Gait unremarkable.   Osteopathic findings T1 extended rotated and side bent right T3 extended rotated and side bent left T7 extended rotated and side bent right L3 flexed rotated and side bent right L5 flexed rotated and side bent left Sacrum left on left  Procedure note [id number]; 15 minutes spent for Therapeutic exercises as stated in above notes.  This included exercises focusing on stretching, strengthening, with significant focus on eccentric aspects. Pelvic tilt/bracing  instruction to focus on control of the pelvic girdle and lower abdominal muscles  Glute strengthening exercises, focusing on proper firing of the glutes without engaging the low back muscles Proper stretching techniques for maximum relief for the hamstrings, hip flexors, low back and some rotation where tolerated  Proper technique shown and discussed handout in great detail with ATC.  All questions were discussed and answered.      Impression and Recommendations:     This case required medical decision making of moderate complexity.      Note: This dictation was prepared with Dragon dictation along with smaller phrase technology. Any transcriptional errors that result from this process are unintentional.

## 2015-07-29 NOTE — Assessment & Plan Note (Signed)
Patient has had back pain since her fall back on November 23. States that it goes from the base of first call the way down to her lower back. States that it is more of a tightness than anything else. Muscle relaxer has not helped. Hasn't been trying anti-inflammatories. Have even tried some icing protocol with very minimal benefit. Patient was found to have significant muscle tightness but did respond fairly well to osteopathic manipulation. Do not feel that further imaging is necessary at this time. Given 2 injections today to see if this will help with some of the discomfort and inflammation that can be contribute in. Discussed over-the-counter medications a could be helpful. Do not think that formal physical therapy is necessary at this time the patient was given home exercises and went over them with athletic trainer. Patient and will come back and see me again in 3 weeks.

## 2015-07-29 NOTE — Assessment & Plan Note (Signed)
Decision today to treat with OMT was based on Physical Exam  After verbal consent patient was treated with HVLA, ME, FPR techniques in  thoracic, lumbar and sacral areas  Patient tolerated the procedure well with improvement in symptoms  Patient given exercises, stretches and lifestyle modifications  See medications in patient instructions if given  Patient will follow up in 3-4 weeks 
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
## 2015-07-30 ENCOUNTER — Ambulatory Visit: Payer: Self-pay | Admitting: Internal Medicine

## 2015-08-08 ENCOUNTER — Ambulatory Visit (INDEPENDENT_AMBULATORY_CARE_PROVIDER_SITE_OTHER): Payer: 59 | Admitting: Neurology

## 2015-08-08 ENCOUNTER — Encounter: Payer: Self-pay | Admitting: Neurology

## 2015-08-08 VITALS — BP 114/62 | HR 96 | Ht 61.0 in | Wt 150.0 lb

## 2015-08-08 DIAGNOSIS — M542 Cervicalgia: Secondary | ICD-10-CM

## 2015-08-08 DIAGNOSIS — G43709 Chronic migraine without aura, not intractable, without status migrainosus: Secondary | ICD-10-CM | POA: Diagnosis not present

## 2015-08-08 MED ORDER — RIZATRIPTAN BENZOATE 10 MG PO TBDP: ORAL_TABLET | ORAL | Status: DC

## 2015-08-08 NOTE — Patient Instructions (Signed)
Migraine Recommendations: 1.  Increase topiramate to two tablets at bedtime.  Call in 4 weeks with update and we can adjust dose or change medication 2.  Take rizatriptan (Maxalt) 10mg  at earliest onset of headache.  May repeat dose once in 2 hours if needed.  Do not exceed two tablets in 24 hours. 3.  STOP BUTALBITAL.  Limit use of pain relievers to no more than 2 days out of the week.  This will help reduce risk of rebound headaches. 4.  Be aware of common food triggers such as processed sweets, processed foods with nitrites (such as deli meat, hot dogs, sausages), foods with MSG, alcohol (such as wine), chocolate, certain cheeses, certain fruits (dried fruits, some citrus fruit), vinegar, diet soda. 4.  Avoid caffeine 5.  Routine exercise 6.  Proper sleep hygiene 7.  Stay adequately hydrated with water 8.  Keep a headache diary. 9.  Maintain proper stress management. 10.  Do not skip meals. 11.  Consider supplements:  Magnesium oxide 400mg  to 600mg  daily, riboflavin 400mg , Coenzyme Q 10 100mg  three times daily 12.  Follow up

## 2015-08-08 NOTE — Progress Notes (Signed)
NEUROLOGY CONSULTATION NOTE  Rachel [name] MRN: [id number] DOB: 1966/01/01  Referring provider: Dr. Jenny Reichmann Primary care provider: Dr. Jenny Reichmann  Reason for consult:  migraine  HISTORY OF PRESENT ILLNESS: Rachel [name] is a 50 year old right-handed female with type 2 diabetes, hyperlipidemia, GERD, asthma, intermittent vertigo, and migraine who presents for migraine.  History obtained by patient, her husband, prior neurologist's note (Dr. Jacelyn Grip), and PCP note.  Images of brain MRI from 2013 and labs reviewed.  Onset:  50.  She does have remote history of headaches when she was younger. Location:  Back of head and radiates to the front Quality:  Shooting up from back of head, pounding on top and front of head Intensity:  8/10 Aura:  no Prodrome:  no Associated symptoms:  Nausea, photophobia, phonophobia, blurred vision (she gets annual eye exams due to diabetes) Duration:  All day Frequency:  Almost daily (cannot function 6 days per month) Triggers/exacerbating factors:  unknown Relieving factors:  No Activity:  Cannot work about 6 days per month (she works as an Advertising account planner)  Past abortive medication:  Sumatriptan 100mg  (ineffective), Cambia (made headache worse), Excedrin, naproxen, Tylenol Past preventative medication:  none Other past therapy:  none  Current abortive medication:  Fioricet (daily) Current antiemetic:  Zofran ODT 8mg  Antihypertensive medications:  none Antidepressant medications:  none Anticonvulsant medications:  topiramate 50mg  at bedtime Vitamins/Herbal/Supplements:  no Other medication:  Robaxin (for neck pain)  She has longstanding history of dizziness with sudden falls, without loss of consciousness.  She was evaluated by neurology.  MRI of the brain with seizure protocol was performed on 08/02/11, which was unremarkable.  Sleep deprived EEG was normal.  Spells were believed to be related to positional vertigo and she was send for vestibular rehab.      Caffeine:  Coffee daily Alcohol:  rarely Smoker:  no Diet:  Good.  Keeps hydrated Exercise:  no Depression/stress:  no Sleep hygiene:  poor Family history of headache:  no  PAST MEDICAL HISTORY: Past Medical History  Diagnosis Date  . DIABETES MELLITUS, TYPE II 08/02/2007  . HYPERLIPIDEMIA 08/02/2007  . COMMON MIGRAINE 05/15/2009  . ALLERGIC RHINITIS 10/04/2007  . GERD 08/02/2007  . INTERMITTENT VERTIGO 05/15/2009  . INSOMNIA-SLEEP DISORDER-UNSPEC 01/04/2008  . LIBIDO, DECREASED 01/09/2010  . Anemia 01/21/2011  . ASTHMA 08/02/2007    INHALERS ONLY IN [place]  . Blood transfusion without reported diagnosis     with first child     PAST SURGICAL HISTORY: Past Surgical History  Procedure Laterality Date  . Cesarean section      x2  . S/p left knee surgury    . Parotidectomy  10/21/2011    tumor removed behind hear drum  . Parotidectomy  10/21/2011    Procedure: PAROTIDECTOMY;  Surgeon: Melida Quitter, MD;  Location: [facility] OR;  Service: ENT;  Laterality: Left;    MEDICATIONS: Current Outpatient Prescriptions on File Prior to Visit  Medication Sig Dispense Refill  . aspirin 81 MG EC tablet Take 81 mg by mouth daily.      . butalbital-acetaminophen-caffeine (FIORICET, ESGIC) 50-325-40 MG tablet Take 1 tablet by mouth every 6 (six) hours as needed for headache. 30 tablet 0  . Diclofenac Potassium 50 MG PACK Take 50 mg by mouth once as needed. Take once daily as needed with headache onset. Please take with food 9 each 3  . esomeprazole (NEXIUM) 40 MG capsule Take 1 capsule (40 mg total) by mouth daily. 90 capsule 1  .  glipiZIDE (GLUCOTROL) 5 MG tablet Take 1 tablet (5 mg total) by mouth 2 (two) times daily before a meal. 60 tablet 3  . Insulin Glargine (LANTUS SOLOSTAR) 100 UNIT/ML Solostar Pen Inject 36 Units into the skin daily at 10 pm. 15 mL 2  . meloxicam (MOBIC) 15 MG tablet Take 1 tablet (15 mg total) by mouth daily as needed for pain. 90 tablet 1  . metFORMIN (GLUCOPHAGE XR) 500 MG  24 hr tablet Take 2 tablets (1,000 mg total) by mouth 2 (two) times daily with a meal. 120 tablet 5  . methocarbamol (ROBAXIN) 500 MG tablet Take 1 tablet (500 mg total) by mouth 2 (two) times daily as needed for muscle spasms. 30 tablet 0  . ondansetron (ZOFRAN-ODT) 8 MG disintegrating tablet Take 1 tablet (8 mg total) by mouth every 12 (twelve) hours as needed for nausea or vomiting. 20 tablet 0  . pantoprazole (PROTONIX) 40 MG tablet Take 40 mg by mouth daily. Reported on 07/16/2015    . rosuvastatin (CRESTOR) 40 MG tablet Take 1 tablet (40 mg total) by mouth daily. 90 tablet 3  . sitaGLIPtin (JANUVIA) 100 MG tablet Take 1 tablet (100 mg total) by mouth daily. 90 tablet 1  . topiramate (TOPAMAX) 50 MG tablet Take 1 tablet (50 mg total) by mouth 2 (two) times daily. 60 tablet 5  . TRUEPLUS LANCETS 30G MISC USE AS DIRECTED 100 each PRN  . TRUETEST TEST test strip USE AS DIRECTED ONCE DAILY. 100 each PRN  . UNIFINE PENTIPS 32G X 4 MM MISC USE WITH LANTUS 100 each PRN   No current facility-administered medications on file prior to visit.    ALLERGIES: Allergies  Allergen Reactions  . Atorvastatin     REACTION: myalgias  . Nitroglycerin     FAMILY HISTORY: Family History  Problem Relation Age of Onset  . Hypertension Mother   . Hypertension Father   . Diabetes Father   . Asthma Father   . Cancer Other     cervical cancer  . Anesthesia problems Neg Hx   . Colon cancer Neg Hx     SOCIAL HISTORY: Social History   Social History  . Marital Status: Married    Spouse Name: [name]  . Number of Children: 3  . Years of Education: Degree   Occupational History  . NURSE TECH   . [employer]   Social History Main Topics  . Smoking status: Never Smoker   . Smokeless tobacco: Never Used  . Alcohol Use: No  . Drug Use: No  . Sexual Activity: Yes   Other Topics Concern  . Not on file   Social History Narrative   She has 3 daughters.   Patient has a 4 year degree.     Patient working at [employer].    Patient is married to [place].    REVIEW OF SYSTEMS: Constitutional: No fevers, chills, or sweats, no generalized fatigue, change in appetite Eyes: No visual changes, double vision, eye pain Ear, nose and throat: No hearing loss, ear pain, nasal congestion, sore throat Cardiovascular: No chest pain, palpitations Respiratory:  No shortness of breath at rest or with exertion, wheezes GastrointestinaI: No nausea, vomiting, diarrhea, abdominal pain, fecal incontinence Genitourinary:  No dysuria, urinary retention or frequency Musculoskeletal:  No neck pain, back pain Integumentary: No rash, pruritus, skin lesions Neurological: as above Psychiatric: No depression, insomnia, anxiety Endocrine: No palpitations, fatigue, diaphoresis, mood swings, change in appetite, change in weight, increased thirst  Hematologic/Lymphatic:  No anemia, purpura, petechiae. Allergic/Immunologic: no itchy/runny eyes, nasal congestion, recent allergic reactions, rashes  PHYSICAL EXAM: Filed Vitals:   08/08/15 1002  BP: 114/62  Pulse: 96   General: No acute distress.  Patient appears well-groomed.  Head:  Normocephalic/atraumatic Eyes:  fundi unremarkable, without vessel changes, exudates, hemorrhages or papilledema. Neck: supple, no paraspinal tenderness, full range of motion Back: No paraspinal tenderness Heart: regular rate and rhythm Lungs: Clear to auscultation bilaterally. Vascular: No carotid bruits. Neurological Exam: Mental status: alert and oriented to person, place, and time, recent and remote memory intact, fund of knowledge intact, attention and concentration intact, speech fluent and not dysarthric, language intact. Cranial nerves: CN I: not tested CN II: pupils equal, round and reactive to light, visual fields intact, fundi unremarkable, without vessel changes, exudates, hemorrhages or papilledema. CN III, IV, VI:  full range of motion, no nystagmus, no  ptosis CN V: facial sensation intact CN VII: upper and lower face symmetric CN VIII: hearing intact CN IX, X: gag intact, uvula midline CN XI: sternocleidomastoid and trapezius muscles intact CN XII: tongue midline Bulk & Tone: normal, no fasciculations. Motor:  5/5 throughout  Sensation:  Pinprick and vibration sensation intact. Deep Tendon Reflexes:  2+ throughout, toes downgoing.  Finger to nose testing:  Without dysmetria.  Heel to shin:  Without dysmetria.  Gait:  Normal station and stride.  Able to turn and tandem walk. Romberg negative.  IMPRESSION: Chronic migraine  PLAN: 1.  Increase topiramate to 100mg  at bedtime.  She will call in 4 weeks.  If headaches not improved, will likely change preventative to an antidepressant such as venlafaxine ER. 2.  Maxalt 10mg  for abortive therapy 3.  Caffeine cessation 4.  Routine exercise 5.  Sleep hygiene 6.  Headache diary 7.  Follow up in 3 months.  Thank you for allowing me to take part in the care of this patient.  Metta Clines, DO  CC:  Cathlean Cower, MD

## 2015-08-14 DIAGNOSIS — D239 Other benign neoplasm of skin, unspecified: Secondary | ICD-10-CM | POA: Diagnosis not present

## 2015-08-14 DIAGNOSIS — L732 Hidradenitis suppurativa: Secondary | ICD-10-CM | POA: Diagnosis not present

## 2015-08-14 DIAGNOSIS — L709 Acne, unspecified: Secondary | ICD-10-CM | POA: Diagnosis not present

## 2015-08-19 ENCOUNTER — Ambulatory Visit: Payer: 59 | Admitting: Family Medicine

## 2015-08-28 ENCOUNTER — Encounter: Payer: Self-pay | Admitting: Gastroenterology

## 2015-08-28 ENCOUNTER — Ambulatory Visit (INDEPENDENT_AMBULATORY_CARE_PROVIDER_SITE_OTHER): Payer: 59 | Admitting: Gastroenterology

## 2015-08-28 VITALS — BP 96/70 | HR 80 | Ht 60.75 in | Wt 149.2 lb

## 2015-08-28 DIAGNOSIS — R198 Other specified symptoms and signs involving the digestive system and abdomen: Secondary | ICD-10-CM

## 2015-08-28 DIAGNOSIS — R1314 Dysphagia, pharyngoesophageal phase: Secondary | ICD-10-CM

## 2015-08-28 DIAGNOSIS — F458 Other somatoform disorders: Secondary | ICD-10-CM

## 2015-08-28 DIAGNOSIS — R0989 Other specified symptoms and signs involving the circulatory and respiratory systems: Secondary | ICD-10-CM

## 2015-08-28 DIAGNOSIS — K219 Gastro-esophageal reflux disease without esophagitis: Secondary | ICD-10-CM | POA: Diagnosis not present

## 2015-08-28 NOTE — Progress Notes (Signed)
Rachel [name] Gastroenterology Consult Note:  History: Rachel [name] 08/28/2015  Referring physician: Cathlean Cower, MD  Reason for consult/chief complaint: Dysphagia and Cough   Subjective HPI:  This is the initial office consult for a 50 year old woman referred for chronic GERD as well as more recent onset dysphagia and throat discomfort. She reports about 15 years of frequent GERD symptoms with pyrosis and regurgitation. This has required daily Nexium use. For about the last year, she has a fairly constant sensation of fullness and something in the throat.  It seems worse when eating, and she has episodes of coughing and choking when eating solid food or liquids. She wakes at night with coughing or choking as well. She denies early satiety, nausea, vomiting, or weight loss. She is concerned because she thinks her tonsils may be too big. Also, several family members have needed tonsils removed for recurrent infection. She denies sinus congestion or drainage or postnasal drip Rachel [name] was to have a screening colonoscopy with Dr. Carlean Purl a few months ago, but says that the liquid diet the day before and the bowel prep gave her a migraine, and she had to cancel. Bowel habits are regular, she denies rectal bleeding, but she tends toward abdominal bloating if she sits too long.   ROS:  Review of Systems  Constitutional: Negative for appetite change and unexpected weight change.  HENT: Negative for mouth sores and voice change.   Eyes: Negative for pain and redness.  Respiratory: Negative for cough and shortness of breath.   Cardiovascular: Negative for chest pain and palpitations.  Genitourinary: Negative for dysuria and hematuria.  Musculoskeletal: Negative for myalgias and arthralgias.  Skin: Negative for pallor and rash.  Neurological: Negative for weakness and headaches.  Hematological: Negative for adenopathy.     Past Medical History: Past Medical History  Diagnosis Date  . DIABETES  MELLITUS, TYPE II 08/02/2007  . HYPERLIPIDEMIA 08/02/2007  . COMMON MIGRAINE 05/15/2009  . ALLERGIC RHINITIS 10/04/2007  . GERD 08/02/2007  . INTERMITTENT VERTIGO 05/15/2009  . INSOMNIA-SLEEP DISORDER-UNSPEC 01/04/2008  . LIBIDO, DECREASED 01/09/2010  . Anemia 01/21/2011  . ASTHMA 08/02/2007    INHALERS ONLY IN [place]  . Blood transfusion without reported diagnosis     with first child    She is concerned b/c multiple family members have needed tonsils out for infections.   Past Surgical History: Past Surgical History  Procedure Laterality Date  . Cesarean section      x 3  . Cyst excision      left knee  . Parotidectomy  10/21/2011    Procedure: PAROTIDECTOMY;  Surgeon: Melida Quitter, MD;  Location: [facility] OR;  Service: ENT;  Laterality: Left;     Family History: Family History  Problem Relation Age of Onset  . Hypertension Father   . Diabetes Father   . Asthma Father   . Cervical cancer Maternal Aunt   . Anesthesia problems Neg Hx   . Colon cancer Neg Hx   . Heart disease Maternal Aunt     Social History: Social History   Social History  . Marital Status: Married    Spouse Name: Rachel [name]  . Number of Children: 3  . Years of Education: Degree   Occupational History  . NURSE TECH   . [employer]   Social History Main Topics  . Smoking status: Never Smoker   . Smokeless tobacco: Never Used  . Alcohol Use: No  . Drug Use: No  . Sexual Activity: Yes  Other Topics Concern  . None   Social History Narrative   She has 3 daughters.   Patient has a 4 year degree.    Patient working at [employer].    Patient is married to Rachel [name].    Allergies: Allergies  Allergen Reactions  . Lipitor [Atorvastatin]     REACTION: myalgias  . Nitroglycerin     Outpatient Meds: Current Outpatient Prescriptions  Medication Sig Dispense Refill  . aspirin 81 MG EC tablet Take 81 mg by mouth daily.      . butalbital-acetaminophen-caffeine (FIORICET, ESGIC) 50-325-40 MG tablet  Take 1 tablet by mouth every 6 (six) hours as needed for headache. 30 tablet 0  . Diclofenac Potassium 50 MG PACK Take 50 mg by mouth once as needed. Take once daily as needed with headache onset. Please take with food 9 each 3  . esomeprazole (NEXIUM) 40 MG capsule Take 1 capsule (40 mg total) by mouth daily. 90 capsule 1  . glipiZIDE (GLUCOTROL) 5 MG tablet Take 1 tablet (5 mg total) by mouth 2 (two) times daily before a meal. 60 tablet 3  . Insulin Glargine (LANTUS SOLOSTAR) 100 UNIT/ML Solostar Pen Inject 36 Units into the skin daily at 10 pm. 15 mL 2  . meloxicam (MOBIC) 15 MG tablet Take 1 tablet (15 mg total) by mouth daily as needed for pain. 90 tablet 1  . metFORMIN (GLUCOPHAGE XR) 500 MG 24 hr tablet Take 2 tablets (1,000 mg total) by mouth 2 (two) times daily with a meal. 120 tablet 5  . methocarbamol (ROBAXIN) 500 MG tablet Take 1 tablet (500 mg total) by mouth 2 (two) times daily as needed for muscle spasms. 30 tablet 0  . ondansetron (ZOFRAN-ODT) 8 MG disintegrating tablet Take 1 tablet (8 mg total) by mouth every 12 (twelve) hours as needed for nausea or vomiting. 20 tablet 0  . rizatriptan (MAXALT-MLT) 10 MG disintegrating tablet Take 1tablet at earliest onset of headache.  May repeat once in 2 hours if needed.  Do note exceed 2 tablets in 24 hrs. 9 tablet 11  . rosuvastatin (CRESTOR) 40 MG tablet Take 1 tablet (40 mg total) by mouth daily. 90 tablet 3  . sitaGLIPtin (JANUVIA) 100 MG tablet Take 1 tablet (100 mg total) by mouth daily. 90 tablet 1  . topiramate (TOPAMAX) 50 MG tablet Take 1 tablet (50 mg total) by mouth 2 (two) times daily. 60 tablet 5  . TRUEPLUS LANCETS 30G MISC USE AS DIRECTED 100 each PRN  . TRUETEST TEST test strip USE AS DIRECTED ONCE DAILY. 100 each PRN  . UNIFINE PENTIPS 32G X 4 MM MISC USE WITH LANTUS 100 each PRN   No current facility-administered medications for this visit.       ___________________________________________________________________ Objective  Exam:  BP 96/70 mmHg  Pulse 80  Ht 5' 0.75" (1.543 m)  Wt 149 lb 4 oz (67.699 kg)  BMI 28.43 kg/m2   General: this is a well-appearing middle-aged [place] woman with good muscle mass   Eyes: sclera anicteric, no redness  ENT: oral mucosa moist without lesions, no cervical or supraclavicular lymphadenopathy, good dentition. No adenopathy fullness or tenderness in the neck. Her tonsils are large but did not look inflamed.  CV: RRR without murmur, S1/S2, no JVD, no peripheral edema  Resp: clear to auscultation bilaterally, normal RR and effort noted  GI: soft, no tenderness, with active bowel sounds. No guarding or palpable organomegaly noted.  Skin; warm and dry, no rash or  jaundice noted  Neuro: awake, alert and oriented x 3. Normal gross motor function and fluent speech  No lab or radiologic testing for review   Assessment: Encounter Diagnoses  Name Primary?  Marland Kitchen Dysphagia, pharyngoesophageal phase Yes  . Globus sensation   . Gastroesophageal reflux disease, esophagitis presence not specified     Difficult to tell if the globus sensation is reflux related. Seems doubtful that her enlarged tonsils are the source of these symptoms.  Plan:  EGD tomorrow.  The benefits and risks of the planned procedure were described in detail with the patient or (when appropriate) their health care proxy.  Risks were outlined as including, but not limited to, bleeding, infection, perforation, adverse medication reaction leading to cardiac or pulmonary decompensation, or pancreatitis (if ERCP).  The limitation of incomplete mucosal visualization was also discussed.  No guarantees or warranties were given.   If unremarkable, consider referral to ENT  She declines another attempt at preparation for screening colonoscopy.  Thank you for the courtesy of this consult.  Please call me with any questions  or concerns.  Nelida Meuse III

## 2015-08-28 NOTE — Patient Instructions (Signed)

## 2015-08-29 ENCOUNTER — Encounter: Payer: Self-pay | Admitting: Gastroenterology

## 2015-08-29 ENCOUNTER — Ambulatory Visit (AMBULATORY_SURGERY_CENTER): Payer: 59 | Admitting: Gastroenterology

## 2015-08-29 VITALS — BP 112/79 | HR 72 | Temp 99.3°F | Resp 14 | Ht 60.0 in | Wt 149.0 lb

## 2015-08-29 DIAGNOSIS — R1314 Dysphagia, pharyngoesophageal phase: Secondary | ICD-10-CM | POA: Diagnosis not present

## 2015-08-29 DIAGNOSIS — E119 Type 2 diabetes mellitus without complications: Secondary | ICD-10-CM | POA: Diagnosis not present

## 2015-08-29 DIAGNOSIS — K219 Gastro-esophageal reflux disease without esophagitis: Secondary | ICD-10-CM | POA: Diagnosis not present

## 2015-08-29 DIAGNOSIS — R1310 Dysphagia, unspecified: Secondary | ICD-10-CM

## 2015-08-29 DIAGNOSIS — K2990 Gastroduodenitis, unspecified, without bleeding: Secondary | ICD-10-CM | POA: Diagnosis not present

## 2015-08-29 DIAGNOSIS — E669 Obesity, unspecified: Secondary | ICD-10-CM | POA: Diagnosis not present

## 2015-08-29 DIAGNOSIS — K2970 Gastritis, unspecified, without bleeding: Secondary | ICD-10-CM | POA: Diagnosis not present

## 2015-08-29 DIAGNOSIS — R1319 Other dysphagia: Secondary | ICD-10-CM

## 2015-08-29 LAB — GLUCOSE, CAPILLARY
Glucose-Capillary: 103 mg/dL — ABNORMAL HIGH (ref 65–99)
Glucose-Capillary: 123 mg/dL — ABNORMAL HIGH (ref 65–99)

## 2015-08-29 MED ORDER — SODIUM CHLORIDE 0.9 % IV SOLN: 500.0000 mL | INTRAVENOUS | Status: DC

## 2015-08-29 NOTE — Patient Instructions (Addendum)
YOU HAD AN ENDOSCOPIC PROCEDURE TODAY AT [facility]:   Refer to the procedure report that was given to you for any specific questions about what was found during the examination.  If the procedure report does not answer your questions, please call your gastroenterologist to clarify.  If you requested that your care partner not be given the details of your procedure findings, then the procedure report has been included in a sealed envelope for you to review at your convenience later.  YOU SHOULD EXPECT: Some feelings of bloating in the abdomen. Passage of more gas than usual.  Walking can help get rid of the air that was put into your GI tract during the procedure and reduce the bloating. If you had a lower endoscopy (such as a colonoscopy or flexible sigmoidoscopy) you may notice spotting of blood in your stool or on the toilet paper. If you underwent a bowel prep for your procedure, you may not have a normal bowel movement for a few days.  Please Note:  You might notice some irritation and congestion in your nose or some drainage.  This is from the oxygen used during your procedure.  There is no need for concern and it should clear up in a day or so.  SYMPTOMS TO REPORT IMMEDIATELY:    Following upper endoscopy (EGD)  Vomiting of blood or coffee ground material  New chest pain or pain under the shoulder blades  Painful or persistently difficult swallowing  New shortness of breath  Fever of 100F or higher  Black, tarry-looking stools  For urgent or emergent issues, a gastroenterologist can be reached at any hour by calling [phone].   DIET: Your first meal following the procedure should be a small meal and then it is ok to progress to your normal diet. Heavy or fried foods are harder to digest and may make you feel nauseous or bloated.  Likewise, meals heavy in dairy and vegetables can increase bloating.  Drink plenty of fluids but you should avoid alcoholic beverages  for 24 hours.  ACTIVITY:  You should plan to take it easy for the rest of today and you should NOT DRIVE or use heavy machinery until tomorrow (because of the sedation medicines used during the test).    FOLLOW UP: Our staff will call the number listed on your records the next business day following your procedure to check on you and address any questions or concerns that you may have regarding the information given to you following your procedure. If we do not reach you, we will leave a message.  However, if you are feeling well and you are not experiencing any problems, there is no need to return our call.  We will assume that you have returned to your regular daily activities without incident.  If any biopsies were taken you will be contacted by phone or by letter within the next 1-3 weeks.  Please call us at [phone] if you have not heard about the biopsies in 3 weeks.    SIGNATURES/CONFIDENTIALITY: You and/or your care partner have signed paperwork which will be entered into your electronic medical record.  These signatures attest to the fact that that the information above on your After Visit Summary has been reviewed and is understood.  Full responsibility of the confidentiality of this discharge information lies with you and/or your care-partner.    Handouts were given to your care partner on GERD and a hiatal hernia. Continue taking nexium 40  mg once daily at breakfast, but add omeprazole 20 mg OTC once daily at supper. You may resume your other current medications today. Dr. Corena Pilgrim office will call you with a follow up appointment in the office for 4-5 weeks to reassess your symptoms and consider pH/manomerty testing. Await biopsy results. Your blood sugar was 103 in the recovery room. Please call if any questions or concerns.

## 2015-08-29 NOTE — Progress Notes (Signed)
Called to room to assist during endoscopic procedure.  Patient ID and intended procedure confirmed with present staff. Received instructions for my participation in the procedure from the performing physician.  

## 2015-08-29 NOTE — Progress Notes (Signed)
No problems noted in the recovery room. maw 

## 2015-08-29 NOTE — Op Note (Signed)
[facility] Patient Name: Rachel [name] Procedure Date: 08/29/2015 3:01 PM MRN: [id number] Endoscopist: Mallie Mussel L. Loletha Carrow , MD Age: 50 Referring MD:  Date of Birth: 28-Oct-1965 Gender: Female Procedure:                Upper GI endoscopy Indications:              Dysphagia, Esophageal reflux symptoms that persist                            despite appropriate therapy, Globus sensation Medicines:                Monitored Anesthesia Care Procedure:                Pre-Anesthesia Assessment:                           - Prior to the procedure, a History and Physical                            was performed, and patient medications and                            allergies were reviewed. The patient's tolerance of                            previous anesthesia was also reviewed. The risks                            and benefits of the procedure and the sedation                            options and risks were discussed with the patient.                            All questions were answered, and informed consent                            was obtained. Prior Anticoagulants: The patient has                            taken no previous anticoagulant or antiplatelet                            agents. ASA Grade Assessment: II - A patient with                            mild systemic disease. After reviewing the risks                            and benefits, the patient was deemed in                            satisfactory condition to undergo the procedure.  After obtaining informed consent, the endoscope was                            passed under direct vision. Throughout the                            procedure, the patient's blood pressure, pulse, and                            oxygen saturations were monitored continuously. The                            Model GIF-HQ190 [id number]) scope was introduced                            through the mouth, and advanced  to the second part                            of duodenum. The upper GI endoscopy was                            accomplished without difficulty. The patient                            tolerated the procedure well. Scope In: Scope Out: Findings:      A small hiatal hernia was present (GEJ at 33cm from teeth, hiatus at       36cm).      A few erosions were found in the gastric antrum. Biopsies were taken       with a cold forceps for histology.      The examined duodenum was normal.      The cardia and gastric fundus were normal on retroflexion.      The vocal cords and visualized supraglottic area were normal. Complications:            No immediate complications. Estimated Blood Loss:     Estimated blood loss: none. Impression:               - Small hiatal hernia.                           - Erosive gastropathy. Biopsied.                           - Normal examined duodenum. Recommendation:           - Patient has a contact number available for                            emergencies. The signs and symptoms of potential                            delayed complications were discussed with the                            patient. Return to normal activities tomorrow.  Written discharge instructions were provided to the                            patient.                           - Resume previous diet.                           - Continue present medications.                           - Await pathology results.                           - No repeat upper endoscopy.                           - Continue to take nexium 40 mg once daily at                            breakfast, but add omeprazole 20 mg (OTC) once                            daily at supper.                           GERD diet and lifestyle recommendations given.                           Follow up at clinic in 4-5 weeks to reassess                            symptoms and consider pH/manometry  testing. Procedure Code(s):        --- Professional ---                           [phone], Esophagogastroduodenoscopy, flexible,                            transoral; with biopsy, single or multiple CPT copyright 2016 American Medical Association. All rights reserved. Rayne Cowdrey L. Loletha Carrow, MD Estill Cotta Loletha Carrow, MD 08/29/2015 3:33:07 PM Number of Addenda: 0

## 2015-08-29 NOTE — Progress Notes (Signed)
Report to PACU, RN, vss, BBS= Clear.  

## 2015-09-01 ENCOUNTER — Telehealth: Payer: Self-pay

## 2015-09-01 NOTE — Telephone Encounter (Signed)
  Follow up Call-  Call back number 08/29/2015  Post procedure Call Back phone  # [phone]  Permission to leave phone message Yes     Patient questions:  Do you have a fever, pain , or abdominal swelling? No. Pain Score  0 *  Have you tolerated food without any problems? Yes.    Have you been able to return to your normal activities? Yes.    Do you have any questions about your discharge instructions: Diet   No. Medications  No. Follow up visit  No.  Do you have questions or concerns about your Care? No.  Actions: * If pain score is 4 or above: No action needed, pain <4.  No problems per the pt. maw

## 2015-09-06 ENCOUNTER — Encounter: Payer: Self-pay | Admitting: Gastroenterology

## 2015-09-17 ENCOUNTER — Ambulatory Visit: Payer: Self-pay | Admitting: Family Medicine

## 2015-09-17 DIAGNOSIS — Z0289 Encounter for other administrative examinations: Secondary | ICD-10-CM

## 2015-09-24 ENCOUNTER — Other Ambulatory Visit: Payer: Self-pay | Admitting: Internal Medicine

## 2015-09-29 ENCOUNTER — Telehealth: Payer: Self-pay | Admitting: Internal Medicine

## 2015-09-29 NOTE — Telephone Encounter (Signed)
Pt called in about her refill on her headache meds.  Has this been sent to pharmacy?

## 2015-09-29 NOTE — Telephone Encounter (Signed)
Called to speak to patient, medication has already been sent to pharmacy

## 2015-10-02 NOTE — Telephone Encounter (Signed)
Received call from pt she states pharmacy has not received rx for Firocet for her headaches. Inform pt per chart MD approved on 4/12, and was faxed to [facility]. Will contact them to give them verbal. Called pharmacy had to leave msg on pharmacist vm left md approval for generic Fioricet...Johny Chess

## 2015-10-13 ENCOUNTER — Other Ambulatory Visit: Payer: Self-pay | Admitting: *Deleted

## 2015-10-13 NOTE — Patient Outreach (Signed)
Larra is due for Link To Wellness follow up in May. Called her mobile number and left message requesting she call and schedule appointment. Barrington Ellison RN,CCM,CDE [place] Management Coordinator Link To Wellness Office Phone [phone] Office Fax [phone]

## 2015-10-16 ENCOUNTER — Other Ambulatory Visit: Payer: Self-pay | Admitting: *Deleted

## 2015-10-16 NOTE — Patient Outreach (Signed)
[facility]) Care Management   10/16/2015  Rachel [name] [birth year] [id number]  Rachel [name] is an 50 y.o. female who presents to the [facility] office with her 20 year old granddaughter Rachel [name] and her husband Rachel [name] for routine Link To Wellness follow up for self management assistance with Type II DM and hyperlipidemia.  Subjective:  Rachel [name] is voicing concern that she may not be approved for a loan to buy her house because she has a large amount of debt due to medical bills. She says she cannot afford to keep going to specialist as it is a $40 copay each time.  Rachel [name] says she is checking her blood sugar 2-3 times weekly and it "is always high".  She says she has a very erratic eating pattern and often eats very little and she is fearful of night time low blood sugars. She says she had an endo on 3/17 as she was having trouble swallowing and it showed a small hiatal hernia and erosions in the gastric antrum. Omeprazole was added to her medication regimen in addition to her Nexium for treatment of reflux.  Objective:   Review of Systems  Constitutional: Negative.     Physical Exam  Constitutional: She is oriented to person, place, and time. She appears well-developed and well-nourished.  Cardiovascular: Normal rate and regular rhythm.   Respiratory: Effort normal.  Neurological: She is alert and oriented to person, place, and time.  Skin: Skin is warm and dry.  Psychiatric: She has a normal mood and affect. Her behavior is normal. Judgment and thought content normal.   Filed Weights   10/16/15 1347  Weight: 141 lb 6.4 oz (64.139 kg)   Filed Vitals:   10/16/15 1347  BP: 128/88  Pulse: 87    Encounter Medications:   Outpatient Encounter Prescriptions as of 10/16/2015  Medication Sig  . aspirin 81 MG EC tablet Take 81 mg by mouth daily.    . butalbital-acetaminophen-caffeine (FIORICET, ESGIC) 50-325-40 MG tablet TAKE 1 TABLET  BY MOUTH EVERY 6 HOURS AS NEEDED FOR HEADACHE  . Diclofenac Potassium 50 MG PACK Take 50 mg by mouth once as needed. Take once daily as needed with headache onset. Please take with food  . esomeprazole (NEXIUM) 40 MG capsule Take 1 capsule (40 mg total) by mouth daily.  Marland Kitchen glipiZIDE (GLUCOTROL) 5 MG tablet Take 1 tablet (5 mg total) by mouth 2 (two) times daily before a meal.  . Insulin Glargine (LANTUS SOLOSTAR) 100 UNIT/ML Solostar Pen Inject 36 Units into the skin daily at 10 pm.  . metFORMIN (GLUCOPHAGE XR) 500 MG 24 hr tablet Take 2 tablets (1,000 mg total) by mouth 2 (two) times daily with a meal.  . ondansetron (ZOFRAN-ODT) 8 MG disintegrating tablet Take 1 tablet (8 mg total) by mouth every 12 (twelve) hours as needed for nausea or vomiting.  . rizatriptan (MAXALT-MLT) 10 MG disintegrating tablet Take 1tablet at earliest onset of headache.  May repeat once in 2 hours if needed.  Do note exceed 2 tablets in 24 hrs.  . rosuvastatin (CRESTOR) 40 MG tablet Take 1 tablet (40 mg total) by mouth daily.  . sitaGLIPtin (JANUVIA) 100 MG tablet Take 1 tablet (100 mg total) by mouth daily.  Marland Kitchen topiramate (TOPAMAX) 50 MG tablet Take 1 tablet (50 mg total) by mouth 2 (two) times daily.  . TRUEPLUS LANCETS 30G MISC USE AS DIRECTED  . TRUETEST TEST test strip USE AS DIRECTED ONCE  DAILY.  . UNIFINE PENTIPS 32G X 4 MM MISC USE WITH LANTUS  . meloxicam (MOBIC) 15 MG tablet Take 1 tablet (15 mg total) by mouth daily as needed for pain. (Patient not taking: Reported on 08/29/2015)  . methocarbamol (ROBAXIN) 500 MG tablet Take 1 tablet (500 mg total) by mouth 2 (two) times daily as needed for muscle spasms. (Patient not taking: Reported on 10/16/2015)   No facility-administered encounter medications on file as of 10/16/2015.    Functional Status:   In your present state of health, do you have any difficulty performing the following activities: 10/16/2015 07/16/2015  Hearing? N N  Vision? N N  Difficulty concentrating  or making decisions? N N  Walking or climbing stairs? N N  Dressing or bathing? N N  Doing errands, shopping? N N    Fall/Depression Screening:    PHQ 2/9 Scores 07/16/2015 03/03/2015 01/09/2015 10/04/2013  PHQ - 2 Score 0 0 0 0    Assessment:   THN CM Care Plan Problem One        Most Recent Value   Care Plan Problem One  Type II DM and hyperlipidemia with chronically elevated blood sugar and Hgb A1C with most recent Hgb A1C= 10.9% (Hgb A1C was 10.8% on 01/09/15), lipid profile done 07/09/15 showed elevated triglycerides and total cholesterol    Role Documenting the Problem One  Care Management Coordinator   Care Plan for Problem One  Active   THN Long Term Goal (31-90 days)  Improved glycemic control as evidenced by improved Hgb A1C , at least <10%, and improved lipid profile at next assessment   THN Long Term Goal Start Date  10/16/15   Interventions for Problem One Long Term Goal  updated health history to reflect upper endoscopy done 08/29/15 and  assisted with notifying [facility] that she was directed to take both Nexium and omeprazole to treat erosions in gastric antrum, reviewed current medications and adherence, reviewed onset , peak and duration of Lantus, and reviewed mechanism of action of Glipizide and the importance of eating within 30 minutes of taking the medication to prevent low blood sugar, reviewed 24 hour dietary intake to assess for excessive CHO consumption, discussed barriers to improved DM management including financial strain due to outstanding hospital bills, inability to pay specialty copay to see endocrinologist and fear of nocturnal hypoglycemia due to irregular eating patterns, discussed possibility of receiving financial assistance from [place]- advised her to call [facility] for application, discussed medication option of adding SGLT2 inhibitor since her GFR is normal and she has no history of genital yeast infections or  bladder cancer and stopping Glipizide to decrease incidences of hypoglycemia- encouraged her to discuss with Dr. Jenny Reichmann since she cannot afford to see Dr. Cruzita Lederer at this time,  reviewed upcoming appointments with Dr. Tomi Likens  (neuro) for treatment of chronic migraines, and Dr Jenny Reichmann, will arrange for Link To Wellness follow up in 3-4 months       Plan:  RNCM to fax today's office visit note to Dr. Jenny Reichmann. RNCM will meet quarterly and as needed with patient per Link To Wellness program guidelines to assist with Type II DM and hyperlipidemia self-management and assess patient's progress toward mutually set goals.  Barrington Ellison RN,CCM,CDE [place] Management Coordinator Link To Wellness Office Phone [phone] Office Fax [phone]

## 2015-12-02 ENCOUNTER — Other Ambulatory Visit: Payer: Self-pay | Admitting: Nurse Practitioner

## 2015-12-02 DIAGNOSIS — G44209 Tension-type headache, unspecified, not intractable: Secondary | ICD-10-CM

## 2015-12-03 MED ORDER — ONDANSETRON 8 MG PO TBDP: 8.0000 mg | ORAL_TABLET | Freq: Two times a day (BID) | ORAL | Status: DC | PRN

## 2015-12-03 MED ORDER — METHOCARBAMOL 500 MG PO TABS: 500.0000 mg | ORAL_TABLET | Freq: Two times a day (BID) | ORAL | Status: DC | PRN

## 2015-12-03 NOTE — Addendum Note (Signed)
Addended by: Biagio Borg on: 12/03/2015 12:10 PM   Modules accepted: Orders

## 2015-12-04 ENCOUNTER — Other Ambulatory Visit: Payer: Self-pay | Admitting: Internal Medicine

## 2015-12-04 ENCOUNTER — Ambulatory Visit: Payer: Self-pay | Admitting: Neurology

## 2015-12-04 NOTE — Telephone Encounter (Signed)
Please advise 

## 2015-12-04 NOTE — Telephone Encounter (Signed)
Done hardcopy to Corinne  

## 2015-12-04 NOTE — Telephone Encounter (Signed)
Medication faxed to pharmacy 

## 2015-12-09 ENCOUNTER — Encounter (HOSPITAL_COMMUNITY): Payer: Self-pay | Admitting: Emergency Medicine

## 2015-12-09 ENCOUNTER — Ambulatory Visit (HOSPITAL_COMMUNITY): Payer: PRIVATE HEALTH INSURANCE

## 2015-12-09 ENCOUNTER — Ambulatory Visit (HOSPITAL_COMMUNITY)
Admission: EM | Admit: 2015-12-09 | Discharge: 2015-12-09 | Disposition: A | Discharge status: Home / Self Care | Payer: PRIVATE HEALTH INSURANCE | Attending: Family Medicine | Admitting: Family Medicine

## 2015-12-09 DIAGNOSIS — Z9889 Other specified postprocedural states: Secondary | ICD-10-CM | POA: Diagnosis not present

## 2015-12-09 DIAGNOSIS — Z7982 Long term (current) use of aspirin: Secondary | ICD-10-CM | POA: Insufficient documentation

## 2015-12-09 DIAGNOSIS — Z888 Allergy status to other drugs, medicaments and biological substances status: Secondary | ICD-10-CM | POA: Diagnosis not present

## 2015-12-09 DIAGNOSIS — K219 Gastro-esophageal reflux disease without esophagitis: Secondary | ICD-10-CM | POA: Insufficient documentation

## 2015-12-09 DIAGNOSIS — Y990 Civilian activity done for income or pay: Secondary | ICD-10-CM | POA: Diagnosis not present

## 2015-12-09 DIAGNOSIS — Z794 Long term (current) use of insulin: Secondary | ICD-10-CM | POA: Insufficient documentation

## 2015-12-09 DIAGNOSIS — J45909 Unspecified asthma, uncomplicated: Secondary | ICD-10-CM | POA: Insufficient documentation

## 2015-12-09 DIAGNOSIS — S8992XA Unspecified injury of left lower leg, initial encounter: Secondary | ICD-10-CM | POA: Insufficient documentation

## 2015-12-09 DIAGNOSIS — S4992XA Unspecified injury of left shoulder and upper arm, initial encounter: Secondary | ICD-10-CM | POA: Diagnosis not present

## 2015-12-09 DIAGNOSIS — E785 Hyperlipidemia, unspecified: Secondary | ICD-10-CM | POA: Insufficient documentation

## 2015-12-09 DIAGNOSIS — Z79899 Other long term (current) drug therapy: Secondary | ICD-10-CM | POA: Diagnosis not present

## 2015-12-09 DIAGNOSIS — E119 Type 2 diabetes mellitus without complications: Secondary | ICD-10-CM | POA: Diagnosis not present

## 2015-12-09 DIAGNOSIS — W19XXXA Unspecified fall, initial encounter: Secondary | ICD-10-CM | POA: Insufficient documentation

## 2015-12-09 DIAGNOSIS — S025XXA Fracture of tooth (traumatic), initial encounter for closed fracture: Secondary | ICD-10-CM | POA: Diagnosis not present

## 2015-12-09 NOTE — ED Provider Notes (Signed)
CSN: [id number]     Arrival date & time 12/09/15  1926 History   First MD Initiated Contact with Patient 12/09/15 1943     Chief Complaint  Patient presents with  . Fall   (Consider location/radiation/quality/duration/timing/severity/associated sxs/prior Treatment) HPI History obtained from patient: Location:  Left shoulder, knee Context/Duration:  Fell at work injury to left knee, shoulder and mouth Severity: 6  Quality:ache, sore Timing:    constant        Home Treatment: none, came straight from work Associated symptoms:  Broken tooth Family History:    Past Medical History  Diagnosis Date  . DIABETES MELLITUS, TYPE II 08/02/2007  . HYPERLIPIDEMIA 08/02/2007  . COMMON MIGRAINE 05/15/2009  . ALLERGIC RHINITIS 10/04/2007  . GERD 08/02/2007  . INTERMITTENT VERTIGO 05/15/2009  . INSOMNIA-SLEEP DISORDER-UNSPEC 01/04/2008  . LIBIDO, DECREASED 01/09/2010  . Anemia 01/21/2011  . ASTHMA 08/02/2007    INHALERS ONLY IN [place]  . Blood transfusion without reported diagnosis     with first child    Past Surgical History  Procedure Laterality Date  . Cesarean section      x 3  . Cyst excision      left knee  . Parotidectomy  10/21/2011    Procedure: PAROTIDECTOMY;  Surgeon: Melida Quitter, MD;  Location: [facility] OR;  Service: ENT;  Laterality: Left;   Family History  Problem Relation Age of Onset  . Hypertension Father   . Diabetes Father   . Asthma Father   . Cervical cancer Maternal Aunt   . Anesthesia problems Neg Hx   . Colon cancer Neg Hx   . Heart disease Maternal Aunt    Social History  Substance Use Topics  . Smoking status: Never Smoker   . Smokeless tobacco: Never Used  . Alcohol Use: No   OB History    No data available     Review of Systems  Denies: HEADACHE, NAUSEA, ABDOMINAL PAIN, CHEST PAIN, CONGESTION, DYSURIA, SHORTNESS OF BREATH  Allergies  Lipitor and Nitroglycerin  Home Medications   Prior to Admission medications   Medication Sig Start Date End Date  Taking? Authorizing Provider  aspirin 81 MG EC tablet Take 81 mg by mouth daily.      Historical Provider, MD  butalbital-acetaminophen-caffeine (FIORICET, ESGIC) 50-325-40 MG tablet TAKE 1 TABLET BY MOUTH EVERY 6 HOURS AS NEEDED FOR HEADACHE (MAX 1-2 TABLETS PER DAY) 12/04/15   Biagio Borg, MD  Diclofenac Potassium 50 MG PACK Take 50 mg by mouth once as needed. Take once daily as needed with headache onset. Please take with food 10/04/13   Biagio Borg, MD  esomeprazole (NEXIUM) 40 MG capsule Take 1 capsule (40 mg total) by mouth daily. 01/09/15   Biagio Borg, MD  glipiZIDE (GLUCOTROL) 5 MG tablet Take 1 tablet (5 mg total) by mouth 2 (two) times daily before a meal. 04/29/15   Philemon Kingdom, MD  Insulin Glargine (LANTUS SOLOSTAR) 100 UNIT/ML Solostar Pen Inject 36 Units into the skin daily at 10 pm. 06/19/15   Philemon Kingdom, MD  meloxicam (MOBIC) 15 MG tablet Take 1 tablet (15 mg total) by mouth daily as needed for pain. Patient not taking: Reported on 08/29/2015 07/09/15   Biagio Borg, MD  metFORMIN (GLUCOPHAGE XR) 500 MG 24 hr tablet Take 2 tablets (1,000 mg total) by mouth 2 (two) times daily with a meal. 01/09/15   Biagio Borg, MD  methocarbamol (ROBAXIN) 500 MG tablet Take 1 tablet (500 mg total)  by mouth 2 (two) times daily as needed for muscle spasms. 12/03/15   Biagio Borg, MD  ondansetron (ZOFRAN-ODT) 8 MG disintegrating tablet Take 1 tablet (8 mg total) by mouth every 12 (twelve) hours as needed for nausea or vomiting. 12/03/15   Biagio Borg, MD  rizatriptan (MAXALT-MLT) 10 MG disintegrating tablet Take 1tablet at earliest onset of headache.  May repeat once in 2 hours if needed.  Do note exceed 2 tablets in 24 hrs. 08/08/15   Pieter Partridge, DO  rosuvastatin (CRESTOR) 40 MG tablet Take 1 tablet (40 mg total) by mouth daily. 07/10/15   Biagio Borg, MD  sitaGLIPtin (JANUVIA) 100 MG tablet Take 1 tablet (100 mg total) by mouth daily. 01/09/15   Biagio Borg, MD  topiramate (TOPAMAX) 50 MG  tablet Take 1 tablet (50 mg total) by mouth 2 (two) times daily. 07/09/15   Biagio Borg, MD  TRUEPLUS LANCETS 30G MISC USE AS DIRECTED 01/10/15   Biagio Borg, MD  TRUETEST TEST test strip USE AS DIRECTED ONCE DAILY. 01/10/15   Biagio Borg, MD  UNIFINE PENTIPS 32G X 4 MM MISC USE WITH LANTUS 01/10/15   Biagio Borg, MD   Meds Ordered and Administered this Visit  Medications - No data to display  BP 126/75 mmHg  Pulse 89  Temp(Src) 98.4 F (36.9 C) (Oral)  Resp 16  SpO2 99% No data found.   Physical Exam NURSES NOTES AND VITAL SIGNS REVIEWED. CONSTITUTIONAL: Well developed, well nourished, no acute distress HEENT: normocephalic, atraumatic, broken left central incisor EYES: Conjunctiva normal NECK:normal ROM, supple, no adenopathy PULMONARY:No respiratory distress, normal effort ABDOMINAL: Soft, ND, NT BS+, No CVAT MUSCULOSKELETAL: Normal ROM of all extremities, left knee: no visible deformity, diffusely tender, joint is stable without effusion. Left shoulder: tender to palpation, anterior region.  SKIN: warm and dry without rash PSYCHIATRIC: Mood and affect, behavior are normal  ED Course  Procedures (including critical care time)  Labs Review Labs Reviewed - No data to display  Imaging Review Dg Shoulder Left  12/09/2015  CLINICAL DATA:  Recent fall at work with left shoulder pain, initial encounter EXAM: LEFT SHOULDER - 2+ VIEW COMPARISON:  None. FINDINGS: No acute fracture or dislocation is noted. No gross soft tissue abnormality is seen. The underlying bony thorax is within normal limits. IMPRESSION: No acute abnormality noted. Electronically Signed   By: Inez Catalina M.D.   On: 12/09/2015 20:50   Dg Knee Complete 4 Views Left  12/09/2015  CLINICAL DATA:  Fall at work with left knee pain, initial encounter EXAM: LEFT KNEE - COMPLETE 4+ VIEW COMPARISON:  None. FINDINGS: No evidence of fracture, dislocation, or joint effusion. No evidence of arthropathy or other focal bone  abnormality. Soft tissues are unremarkable. IMPRESSION: No acute abnormality noted. Electronically Signed   By: Inez Catalina M.D.   On: 12/09/2015 20:50    Review of films and report do not reveal fractures. Used as part of MDM. Visual Acuity Review  Right Eye Distance:   Left Eye Distance:   Bilateral Distance:    Right Eye Near:   Left Eye Near:    Bilateral Near:       Return to work note>   MDM   1. Shoulder injury, left, initial encounter   2. Left knee injury, initial encounter   3. Broken tooth, closed, initial encounter     Patient is reassured that there are no issues that require transfer to  higher level of care at this time or additional tests. Patient is advised to continue home symptomatic treatment. Patient is advised that if there are new or worsening symptoms to attend the emergency department, contact primary care provider, or return to UC. Instructions of care provided discharged home in stable condition.    THIS NOTE WAS GENERATED USING A VOICE RECOGNITION SOFTWARE PROGRAM. ALL REASONABLE EFFORTS  WERE MADE TO PROOFREAD THIS DOCUMENT FOR ACCURACY.  I have verbally reviewed the discharge instructions with the patient. A printed AVS was given to the patient.  All questions were answered prior to discharge.      Konrad Felix, PA 12/09/15 2106

## 2015-12-09 NOTE — ED Notes (Signed)
Patient fell while walking.  Reports walking out of room and fell. Left knee, left shoulder, left hip and head hurts.  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

## 2015-12-09 NOTE — Discharge Instructions (Signed)
Shoulder Pain The shoulder is the joint that connects your arm to your body. Muscles and band-like tissues that connect bones to muscles (tendons) hold the joint together. Shoulder pain is felt if an injury or medical problem affects one or more parts of the shoulder. HOME CARE   Put ice on the sore area.  Put ice in a plastic bag.  Place a towel between your skin and the bag.  Leave the ice on for 15-20 minutes, 03-04 times a day for the first 2 days.  Stop using cold packs if they do not help with the pain.  If you were given something to keep your shoulder from moving (sling; shoulder immobilizer), wear it as told. Only take it off to shower or bathe.  Move your arm as little as possible, but keep your hand moving to prevent puffiness (swelling).  Squeeze a soft ball or foam pad as much as possible to help prevent swelling.  Take medicine as told by your doctor. GET HELP IF:  You have progressing new pain in your arm, hand, or fingers.  Your hand or fingers get cold.  Your medicine does not help lessen your pain. GET HELP RIGHT AWAY IF:   Your arm, hand, or fingers are numb or tingling.  Your arm, hand, or fingers are puffy (swollen), painful, or turn white or blue. MAKE SURE YOU:   Understand these instructions.  Will watch your condition.  Will get help right away if you are not doing well or get worse.   This information is not intended to replace advice given to you by your health care provider. Make sure you discuss any questions you have with your health care provider.   Document Released: 11/17/2007 Document Revised: 06/21/2014 Document Reviewed: 09/23/2014 Elsevier Interactive Patient Education 2016 Elsevier Inc. Knee Pain Knee pain is a common problem. It can have many causes. The pain often goes away by following your doctor's home care instructions. Treatment for ongoing pain will depend on the cause of your pain. If your knee pain continues, more tests  may be needed to diagnose your condition. Tests may include X-rays or other imaging studies of your knee. HOME CARE  Take medicines only as told by your doctor.  Rest your knee and keep it raised (elevated) while you are resting.  Do not do things that cause pain or make your pain worse.  Avoid activities where both feet leave the ground at the same time, such as running, jumping rope, or doing jumping jacks.  Apply ice to the knee area:  Put ice in a plastic bag.  Place a towel between your skin and the bag.  Leave the ice on for 20 minutes, 2-3 times a day.  Ask your doctor if you should wear an elastic knee support.  Sleep with a pillow under your knee.  Lose weight if you are overweight. Being overweight can make your knee hurt more.  Do not use any tobacco products, including cigarettes, chewing tobacco, or electronic cigarettes. If you need help quitting, ask your doctor. Smoking may slow the healing of any bone and joint problems that you may have. GET HELP IF:  Your knee pain does not stop, it changes, or it gets worse.  You have a fever along with knee pain.  Your knee gives out or locks up.  Your knee becomes more swollen. GET HELP RIGHT AWAY IF:   Your knee feels hot to the touch.  You have chest pain or trouble  breathing.   This information is not intended to replace advice given to you by your health care provider. Make sure you discuss any questions you have with your health care provider.   Document Released: 08/27/2008 Document Revised: 06/21/2014 Document Reviewed: 08/01/2013 Elsevier Interactive Patient Education 2016 [place] A contusion is a deep bruise. Contusions are the result of a blunt injury to tissues and muscle fibers under the skin. The injury causes bleeding under the skin. The skin overlying the contusion may turn blue, purple, or yellow. Minor injuries will give you a painless contusion, but more severe contusions may stay  painful and swollen for a few weeks.  CAUSES  This condition is usually caused by a blow, trauma, or direct force to an area of the body. SYMPTOMS  Symptoms of this condition include:  Swelling of the injured area.  Pain and tenderness in the injured area.  Discoloration. The area may have redness and then turn blue, purple, or yellow. DIAGNOSIS  This condition is diagnosed based on a physical exam and medical history. An X-ray, CT scan, or MRI may be needed to determine if there are any associated injuries, such as broken bones (fractures). TREATMENT  Specific treatment for this condition depends on what area of the body was injured. In general, the best treatment for a contusion is resting, icing, applying pressure to (compression), and elevating the injured area. This is often called the RICE strategy. Over-the-counter anti-inflammatory medicines may also be recommended for pain control.  HOME CARE INSTRUCTIONS   Rest the injured area.  If directed, apply ice to the injured area:  Put ice in a plastic bag.  Place a towel between your skin and the bag.  Leave the ice on for 20 minutes, 2-3 times per day.  If directed, apply light compression to the injured area using an elastic bandage. Make sure the bandage is not wrapped too tightly. Remove and reapply the bandage as directed by your health care provider.  If possible, raise (elevate) the injured area above the level of your heart while you are sitting or lying down.  Take over-the-counter and prescription medicines only as told by your health care provider. SEEK MEDICAL CARE IF:  Your symptoms do not improve after several days of treatment.  Your symptoms get worse.  You have difficulty moving the injured area. SEEK IMMEDIATE MEDICAL CARE IF:   You have severe pain.  You have numbness in a hand or foot.  Your hand or foot turns pale or cold.   This information is not intended to replace advice given to you by your  health care provider. Make sure you discuss any questions you have with your health care provider.   Document Released: 03/10/2005 Document Revised: 02/19/2015 Document Reviewed: 10/16/2014 Elsevier Interactive Patient Education [employer].

## 2015-12-19 ENCOUNTER — Ambulatory Visit: Payer: Self-pay | Admitting: Neurology

## 2015-12-19 DIAGNOSIS — Z029 Encounter for administrative examinations, unspecified: Secondary | ICD-10-CM

## 2015-12-23 ENCOUNTER — Ambulatory Visit (INDEPENDENT_AMBULATORY_CARE_PROVIDER_SITE_OTHER): Payer: 59 | Admitting: Internal Medicine

## 2015-12-23 ENCOUNTER — Encounter: Payer: Self-pay | Admitting: Internal Medicine

## 2015-12-23 VITALS — BP 118/62 | HR 101 | Resp 20 | Wt 139.0 lb

## 2015-12-23 DIAGNOSIS — M25512 Pain in left shoulder: Secondary | ICD-10-CM

## 2015-12-23 DIAGNOSIS — M545 Low back pain, unspecified: Secondary | ICD-10-CM

## 2015-12-23 DIAGNOSIS — F32A Depression, unspecified: Secondary | ICD-10-CM

## 2015-12-23 DIAGNOSIS — F329 Major depressive disorder, single episode, unspecified: Secondary | ICD-10-CM | POA: Diagnosis not present

## 2015-12-23 MED ORDER — TRAMADOL HCL 50 MG PO TABS: 50.0000 mg | ORAL_TABLET | Freq: Three times a day (TID) | ORAL | Status: DC | PRN

## 2015-12-23 NOTE — Patient Instructions (Signed)
Please take all new medication as prescribed - the pain medication  Please continue all other medications as before, and refills have been done if requested.  Please have the pharmacy call with any other refills you may need.  Please keep your appointments with your specialists as you may have planned  OK for time off work June 29 to return to work July 17, after which I cannot authorize more time.    You will be contacted regarding the referral for: Dr Smith/sports medicine

## 2015-12-23 NOTE — Progress Notes (Signed)
Pre visit review using our clinic review tool, if applicable. No additional management support is needed unless otherwise documented below in the visit note. 

## 2015-12-23 NOTE — Progress Notes (Signed)
Subjective:    Patient ID: Rachel [name], female    DOB: 19-Feb-1966, 50 y.o.   MRN: [id number]  HPI  Here with c/o fall x 2 recently, difficult hx due to persistent tearfulness and crying after starting to discuss, c/o severe pain to upper back, lower back and left shoulder for > than 1 wk, sharp at times and dull, constant, worse to any movement including moving about with sitting, standing up, walking, use of LUE, not better with current meds.  Nothing else makes better or worse. Did have last fall involve striking of left knee, and still has some achinesss mild but at least that has improved.  Pt denies bowel or bladder change, fever, wt loss, worsening UE or LE pain/numbness/weakness, or further falls.  Has been seen at [facility], [facility], and employee health. c/o persistent diffuse back pain (primarily upper and lower backs), tearful regarding her back pain, "it's horrible" "all my back is broken all over", states has been denied workmans comp, and presents a document from [employer] stating ok to return to work with limitation of LUE use.  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 She herseft does not think able to go back to work, just cannot face this due to pain level, also has history of fibromyalgia.Marland Kitchen "I need form filled out for ST disability so I can get paid",  Pt has not seen ortho or sport medicine.  Recent knee and shoulder films June 27 without acute.  Lumbar spine films dec 2016 within normal limits and no acute. C-spine films Nov 2016 with mild degenerative change at C5-6 Past Medical History  Diagnosis Date  . DIABETES MELLITUS, TYPE II 08/02/2007  . HYPERLIPIDEMIA 08/02/2007  . COMMON MIGRAINE 05/15/2009  . ALLERGIC RHINITIS 10/04/2007  . GERD 08/02/2007  . INTERMITTENT VERTIGO 05/15/2009  . INSOMNIA-SLEEP DISORDER-UNSPEC  01/04/2008  . LIBIDO, DECREASED 01/09/2010  . Anemia 01/21/2011  . ASTHMA 08/02/2007    INHALERS ONLY IN [place]  . Blood transfusion without reported diagnosis     with first child    Past Surgical History  Procedure Laterality Date  . Cesarean section      x 3  . Cyst excision      left knee  . Parotidectomy  10/21/2011    Procedure: PAROTIDECTOMY;  Surgeon: Melida Quitter, MD;  Location: [place];  Service: ENT;  Laterality: Left;    reports that she has never smoked. She has never used smokeless tobacco. She reports that she does not drink alcohol or use illicit drugs. family history includes Asthma in her father; Cervical cancer in her maternal aunt; Diabetes in her father; Heart disease in her maternal aunt; Hypertension in her father. There is no history of Anesthesia problems or Colon cancer. Allergies  Allergen Reactions  . Lipitor [Atorvastatin]     REACTION: myalgias  . Nitroglycerin    Current Outpatient Prescriptions on File Prior to Visit  Medication Sig Dispense Refill  . aspirin 81 MG EC tablet Take 81 mg by mouth daily.      . butalbital-acetaminophen-caffeine (FIORICET, ESGIC) 50-325-40 MG tablet TAKE 1 TABLET BY MOUTH EVERY 6 HOURS AS NEEDED FOR HEADACHE (MAX 1-2 TABLETS PER DAY) 30 tablet 1  . Diclofenac Potassium 50 MG PACK Take 50 mg by mouth once as needed. Take once daily  as needed with headache onset. Please take with food 9 each 3  . esomeprazole (NEXIUM) 40 MG capsule Take 1 capsule (40 mg total) by mouth daily. 90 capsule 1  . glipiZIDE (GLUCOTROL) 5 MG tablet Take 1 tablet (5 mg total) by mouth 2 (two) times daily before a meal. 60 tablet 3  . Insulin Glargine (LANTUS SOLOSTAR) 100 UNIT/ML Solostar Pen Inject 36 Units into the skin daily at 10 pm. 15 mL 2  . meloxicam (MOBIC) 15 MG tablet Take 1 tablet (15 mg total) by mouth daily as needed for pain. (Patient not taking: Reported on 08/29/2015) 90 tablet 1  . metFORMIN (GLUCOPHAGE XR) 500 MG 24 hr tablet Take 2  tablets (1,000 mg total) by mouth 2 (two) times daily with a meal. 120 tablet 5  . methocarbamol (ROBAXIN) 500 MG tablet Take 1 tablet (500 mg total) by mouth 2 (two) times daily as needed for muscle spasms. 30 tablet 2  . ondansetron (ZOFRAN-ODT) 8 MG disintegrating tablet Take 1 tablet (8 mg total) by mouth every 12 (twelve) hours as needed for nausea or vomiting. 20 tablet 5  . rizatriptan (MAXALT-MLT) 10 MG disintegrating tablet Take 1tablet at earliest onset of headache.  May repeat once in 2 hours if needed.  Do note exceed 2 tablets in 24 hrs. 9 tablet 11  . rosuvastatin (CRESTOR) 40 MG tablet Take 1 tablet (40 mg total) by mouth daily. 90 tablet 3  . sitaGLIPtin (JANUVIA) 100 MG tablet Take 1 tablet (100 mg total) by mouth daily. 90 tablet 1  . topiramate (TOPAMAX) 50 MG tablet Take 1 tablet (50 mg total) by mouth 2 (two) times daily. 60 tablet 5  . TRUEPLUS LANCETS 30G MISC USE AS DIRECTED 100 each PRN  . TRUETEST TEST test strip USE AS DIRECTED ONCE DAILY. 100 each PRN  . UNIFINE PENTIPS 32G X 4 MM MISC USE WITH LANTUS 100 each PRN   No current facility-administered medications on file prior to visit.   Review of Systems  Constitutional: Negative for unusual diaphoresis or night sweats HENT: Negative for ear swelling or discharge Eyes: Negative for worsening visual haziness  Respiratory: Negative for choking and stridor.   Gastrointestinal: Negative for distension or worsening eructation Genitourinary: Negative for retention or change in urine volume.  Musculoskeletal: Negative joint swelling Skin: Negative for color change and worsening wound Neurological: Negative for tremors and numbness other than noted  Psychiatric/Behavioral: Negative for decreased concentration or agitation other than above       Objective:   Physical Exam  BP 118/62 mmHg  Pulse 101  Resp 20  Wt 139 lb (63.05 kg)  SpO2 98% VS noted, tearful, weepy, occasionally grimaces and cries out during direct  exam only of back and LUE Constitutional: Pt appears in otherwise no apparent distress HENT: Head: NCAT. EOMI, sclera clear Right Ear: External ear normal. Bilat TM's clear Left Ear: External ear normal.  Eyes: . Pupils are equal, round, and reactive to light. Conjunctivae and EOM are normal Neck: Normal range of motion. Neck supple.  Cardiovascular: Normal rate and regular rhythm.   Pulmonary/Chest: Effort normal and breath sounds without rales or wheezing.  Abd:  Soft, NT, ND, + BS Neurological: Pt is alert. Not confused , cn 2-12 intact, motor 5/5 intact, sens intact to LT, dtr symmetric throughout, gait intact  Skin: Skin is warm. No rash, no LE edema Psychiatric: Pt behavior is normal. No agitation. 2-3+ nervous, somewhat tremulous Spine: nontender throughout in the midline  except lowest lumbar  Post neck with apparent mild to mod diffuse bilateral paravertebral tenderness, worse at the lower cervical levels, without swelling, skin change or rash; With extension/flexion/horizontal movement seems to "catch" several times with pain Has bilat thoracic paravertebral tender mild noted without swelling, skin change or rash Lumbar spine with bilateral mild tender paravertebral musculature as well , extension and flexion full but seems to "catch" several times with pain Left shoulder with mild reduced ROM to complete abduction and forward elevation each to approx 100 degrees, again with "catch"" during exam and grimacing, Has diffuse apparent mild tenderness, but no swelling  DG Knee Complete 4 Views Left   Status: Final result       PACS Images     Show images for DG Knee Complete 4 Views Left     Study Result     CLINICAL DATA: Fall at work with left knee pain, initial encounter  EXAM: LEFT KNEE - COMPLETE 4+ VIEW  COMPARISON: None.  FINDINGS: No evidence of fracture, dislocation, or joint effusion. No evidence of arthropathy or other focal bone abnormality. Soft  tissues are unremarkable.  IMPRESSION: No acute abnormality noted.   Electronically Signed  By: Inez Catalina M.D.  On: 12/09/2015 20:50      Assessment & Plan:

## 2016-01-01 NOTE — Assessment & Plan Note (Signed)
Exam c/w diffuse pain mostly cervical and lumbar, seems mild by exam but subjectively mod to severe with tearfulness and crying out, not controlled by current meds, exam otherwise negative for acute, suspect some element of fibromyalgia which she vehemently denies even the previous diagnosis, wonders how that got on her record; Pt does have more significant pain on exam to lumbar spine and left shoulder - for sports med referral for f/u but may not need other specific tx such as cortisone injections or PT.  Cont other current nsaid tx.

## 2016-01-01 NOTE — Assessment & Plan Note (Signed)
Suspect may be an element of co morbidity today, but she denies this, does not want further tx , counseling or referral; primarily seems to need the ST disability form filled out.

## 2016-01-01 NOTE — Assessment & Plan Note (Signed)
Nonspecific by my exam today, for nsaid tx, sport med referral; am unable to verify her statement today that she is not able to return to work with any restriction, will fill out ST disability form with a return to work date of July 17 without restriction as should be much improved soft tissue injury by then.  To f/u with me or sport med if this is not the case

## 2016-01-02 ENCOUNTER — Ambulatory Visit: Payer: 59 | Admitting: Family Medicine

## 2016-01-02 ENCOUNTER — Telehealth: Payer: Self-pay | Admitting: Internal Medicine

## 2016-01-02 NOTE — Telephone Encounter (Signed)
Patient called to follow up on the ST disability paperwork. I advised that i see the notes from the 12/23/2015 visit where is is going to give her ST disability to 12/29/2015, but she needed to see sports med first. She no-showed her appointment today. I did reschedule it, but she states that her ST disability insurance has not gotten the paoperwork. Please call patient and advise on status.

## 2016-01-02 NOTE — Telephone Encounter (Signed)
Spoke to patient, she advised that she would be in next week for her appointment to discuss paperwork

## 2016-01-06 ENCOUNTER — Ambulatory Visit: Payer: 59 | Admitting: Internal Medicine

## 2016-01-06 ENCOUNTER — Ambulatory Visit: Payer: Self-pay | Admitting: Neurology

## 2016-01-07 NOTE — Progress Notes (Signed)
[name] [address] [place], [place] [address] Phone: [phone] Subjective:    I'm seeing this patient by the request  of:  Cathlean Cower, MD   CC: Neck and lower back pain follow-up  [id number]  Rachel [name] is a 50 y.o. female coming in with complaint of neck and lower back pain. Patient has had this pain intermittently but did not follow up with me initially. Patient states recently started having worsening symptoms. Patient was to see me and no showed. Patient reschedule and is here. States that she is having severe pain in the upper and lower back as well as having some pain in the left shoulder. Patient states the pain has been so bad that she has not been able to work. Had been out of work and did have short-term disability. It appears the patient had more of a soft tissue injury of her shoulder and was to return to work. Patient states she continues to have pain. Seems to be in her legs, buttocks, low back, upper back, even her head and sometimes her left shoulder. Patient states when she wakes in the morning seems to be worse. When she gets moving she seems to do relatively better. Patient is taking meloxicam, tramadol, as well as a pain medication but she cannot remember the name that she had from the surgery. Patient states that it is really way that she is able to function throughout the day. States that she feels she needs different pain medications.  I have seen patient previously in November for a different fall. We attempted osteopathic manipulation which patient states did not make significant improvement.   Imaging:all imaging that were independently visualized by me. 12/09/2015 patient did have x-rays of the left shoulder that were unremarkable. Left X-rays taken on the same date no specific findings. Patient had x-rays on 06/10/2015 of the lumbar spine were unremarkable for any bony abnormalities including arthritis. Patient also had  cervical neck x-rays taken 05/07/2015 and only very mild degenerative disc disease at C5-6  Past Medical History:  Diagnosis Date  . ALLERGIC RHINITIS 10/04/2007  . Anemia 01/21/2011  . ASTHMA 08/02/2007   INHALERS ONLY IN [place]  . Blood transfusion without reported diagnosis    with first child   . COMMON MIGRAINE 05/15/2009  . DIABETES MELLITUS, TYPE II 08/02/2007  . GERD 08/02/2007  . HYPERLIPIDEMIA 08/02/2007  . INSOMNIA-SLEEP DISORDER-UNSPEC 01/04/2008  . INTERMITTENT VERTIGO 05/15/2009  . LIBIDO, DECREASED 01/09/2010   Past Surgical History:  Procedure Laterality Date  . CESAREAN SECTION     x 3  . CYST EXCISION     left knee  . PAROTIDECTOMY  10/21/2011   Procedure: PAROTIDECTOMY;  Surgeon: Melida Quitter, MD;  Location: [facility] OR;  Service: ENT;  Laterality: Left;   Social History   Social History  . Marital status: Married    Spouse name: [name]  . Number of children: 3  . Years of education: Degree   Occupational History  . [employer]  . [employer]   Social History Main Topics  . Smoking status: Never Smoker  . Smokeless tobacco: Never Used  . Alcohol use No  . Drug use: No  . Sexual activity: Yes   Other Topics Concern  . None   Social History Narrative   She has 3 daughters.   Patient has a 4 year degree.    Patient working at [employer].  Patient is married to [place].   Allergies  Allergen Reactions  . Lipitor [Atorvastatin]     REACTION: myalgias  . Nitroglycerin    Family History  Problem Relation Age of Onset  . Hypertension Father   . Diabetes Father   . Asthma Father   . Cervical cancer Maternal Aunt   . Anesthesia problems Neg Hx   . Colon cancer Neg Hx   . Heart disease Maternal Aunt     Past medical history, social, surgical and family history all reviewed in electronic medical record.  No pertanent information unless stated regarding to the chief complaint.   Review of Systems: No headache, visual  changes, nausea, vomiting, diarrhea, constipation, dizziness, abdominal pain, skin rash, fevers, chills, night sweats, weight loss, swollen lymph nodes, body aches, joint swelling, muscle aches, chest pain, shortness of breath, mood changes.   Objective  Blood pressure 100/70, pulse 85, height 5' (1.524 m), weight 142 lb (64.4 kg), SpO2 98 %.  General: No apparent distress alert and oriented x3 mood and affect normal, dressed appropriately.  HEENT: Pupils equal, extraocular movements intact  Respiratory: Patient's speak in full sentences and does not appear short of breath  Cardiovascular: No lower extremity edema, non tender, no erythema  Skin: Warm dry intact with no signs of infection or rash on extremities or on axial skeleton.  Abdomen: Soft nontender  Neuro: Cranial nerves II through XII are intact, neurovascularly intact in all extremities with 2+ DTRs and 2+ pulses.  Lymph: No lymphadenopathy of posterior or anterior cervical chain or axillae bilaterally.  Gait normal with good balance and coordination.  MSK:  Non tender with full range of motion and good stability and symmetric strength and tone of shoulders, elbows, wrist, hip, knee and ankles bilaterally.  Neck: Inspection unremarkable. No palpable stepoffs. Negative Spurling's maneuver. Full neck range of motion Grip strength and sensation normal in bilateral hands Strength good C4 to T1 distribution No sensory change to C4 to T1 Negative Hoffman sign bilaterally Reflexes normal Patient significantly tender even to light palpation that seems to be out of proportion in the paraspinal musculature  Back Exam:  Inspection: Unremarkable  Motion: Flexion 35 deg, Extension 25 deg, Side Bending to 25 deg bilaterally,  Rotation to 35 deg bilaterally  SLR laying: Negative  XSLR laying: Negative  Palpable tenderness:  Tenderness that is diffuse over the lumbar and sacral areas bilaterally. Seems to be over the muscles and not as much  over the joints. 18 of 20 trigger points that are consistent with fibromyalgia and noted positive today FABER:  Positive bilateral Sensory change: Gross sensation intact to all lumbar and sacral dermatomes.  Reflexes: 2+ at both patellar tendons, 2+ at achilles tendons, Babinski's downgoing.  Strength at foot  Plantar-flexion: 5/5 Dorsi-flexion: 5/5 Eversion: 5/5 Inversion: 5/5  Leg strength  Quad: 5/5 Hamstring: 5/5 Hip flexor: 5/5 Hip abductors: 5/5  Gait unremarkable.      Impression and Recommendations:     This case required medical decision making of moderate complexity.      Note: This dictation was prepared with Dragon dictation along with smaller phrase technology. Any transcriptional errors that result from this process are unintentional.

## 2016-01-08 ENCOUNTER — Other Ambulatory Visit (INDEPENDENT_AMBULATORY_CARE_PROVIDER_SITE_OTHER): Payer: 59

## 2016-01-08 ENCOUNTER — Encounter: Payer: Self-pay | Admitting: Family Medicine

## 2016-01-08 ENCOUNTER — Ambulatory Visit (INDEPENDENT_AMBULATORY_CARE_PROVIDER_SITE_OTHER): Payer: 59 | Admitting: Family Medicine

## 2016-01-08 ENCOUNTER — Telehealth: Payer: Self-pay

## 2016-01-08 VITALS — BP 100/70 | HR 85 | Ht 60.0 in | Wt 142.0 lb

## 2016-01-08 DIAGNOSIS — M791 Myalgia: Secondary | ICD-10-CM

## 2016-01-08 DIAGNOSIS — M609 Myositis, unspecified: Secondary | ICD-10-CM

## 2016-01-08 DIAGNOSIS — IMO0001 Reserved for inherently not codable concepts without codable children: Secondary | ICD-10-CM | POA: Insufficient documentation

## 2016-01-08 LAB — IBC PANEL
Iron: 76 ug/dL (ref 42–145)
Saturation Ratios: 18.6 % — ABNORMAL LOW (ref 20.0–50.0)
Transferrin: 292 mg/dL (ref 212.0–360.0)

## 2016-01-08 LAB — C-REACTIVE PROTEIN: CRP: 0.2 mg/dL — ABNORMAL LOW (ref 0.5–20.0)

## 2016-01-08 LAB — VITAMIN D 25 HYDROXY (VIT D DEFICIENCY, FRACTURES): VITD: 28.48 ng/mL — ABNORMAL LOW (ref 30.00–100.00)

## 2016-01-08 LAB — TSH: TSH: 0.44 u[IU]/mL (ref 0.35–4.50)

## 2016-01-08 LAB — RHEUMATOID FACTOR: Rhuematoid fact SerPl-aCnc: 27 IU/mL — ABNORMAL HIGH (ref <=14)

## 2016-01-08 LAB — SEDIMENTATION RATE: Sed Rate: 21 mm/hr (ref 0–30)

## 2016-01-08 MED ORDER — VITAMIN D (ERGOCALCIFEROL) 1.25 MG (50000 UNIT) PO CAPS: 50000.0000 [IU] | ORAL_CAPSULE | ORAL | 0 refills | Status: DC

## 2016-01-08 MED ORDER — NAPROXEN 500 MG PO TABS: 500.0000 mg | ORAL_TABLET | Freq: Two times a day (BID) | ORAL | 3 refills | Status: DC

## 2016-01-08 MED ORDER — GABAPENTIN 100 MG PO CAPS: 200.0000 mg | ORAL_CAPSULE | Freq: Every day | ORAL | 3 refills | Status: DC

## 2016-01-08 NOTE — Assessment & Plan Note (Signed)
Patient does have what appears to be more of a myalgia. Patient did have a fall but I do not see any type of bony abnormality and patient's joints all have full range of motion. Patient is able to jump out of a chair very regularly and does have no trouble with any range of motion including her back. Patient's tenderness is only to direct palpation. Patient states that she does have the chronic pain at all times. We discussed with patient at this time we do not have any true diagnosis other then this muscle pain. We will get labs to rule out such things as a myositis, as well as autoimmune diseases or vitamin deficiencies. We discussed with patient to continue to stay active would be better than not. Patient will continue to work with no restrictions. We discussed proper shoes. Patient will come back and see me again otherwise in 3-4 weeks. We did change medications where she will do gabapentin at night as well as discontinue her meloxicam and start naproxen. Return to clinic again in 4 weeks.

## 2016-01-08 NOTE — Patient Instructions (Addendum)
Nice to see you  So far I do not have an answer for why you hurt.  It seems to be more your muscles Ice 20 minutes 2 times daily. Usually after activity and before bed. Gabapentin 200mg  at night Stop the meloxicam  Start naproxen 2 times daily  Turmeric 500mg  twice daily  Go downstairs and we will get labs and see what is going on.  Good shoes at work can help with some of the pain .  See me again in 4 weeks but I will contact you in my chart if we need to change anything.  If not a lot better we may need to consider other medicines or MRI.

## 2016-01-08 NOTE — Telephone Encounter (Signed)
[employer] Dis[employer] paperwork received completed and signed.  Forms faxed, original placed in cabinet for pt pick up.  Pt advised of same via personal VM

## 2016-01-08 NOTE — Progress Notes (Signed)
Pre visit review using our clinic review tool, if applicable. No additional management support is needed unless otherwise documented below in the visit note. 

## 2016-01-09 LAB — PAN-ANCA
ANCA Screen: NEGATIVE
Myeloperoxidase Abs: <1
Serine Protease 3: <1

## 2016-01-09 LAB — CYCLIC CITRUL PEPTIDE ANTIBODY, IGG: Cyclic Citrullin Peptide Ab: <16 Units

## 2016-01-09 LAB — ANA: Anti Nuclear Antibody(ANA): NEGATIVE

## 2016-01-09 LAB — ANTI-DNA ANTIBODY, DOUBLE-STRANDED: ds DNA Ab: <1 IU/mL

## 2016-01-15 ENCOUNTER — Other Ambulatory Visit: Payer: Self-pay

## 2016-01-15 ENCOUNTER — Telehealth: Payer: Self-pay | Admitting: Internal Medicine

## 2016-01-15 MED ORDER — INSULIN GLARGINE 100 UNIT/ML SOLOSTAR PEN: 36.0000 [IU] | PEN_INJECTOR | Freq: Every day | SUBCUTANEOUS | 1 refills | Status: DC

## 2016-01-15 MED ORDER — ESOMEPRAZOLE MAGNESIUM 40 MG PO CPDR: 40.0000 mg | DELAYED_RELEASE_CAPSULE | Freq: Every day | ORAL | 1 refills | Status: DC

## 2016-01-15 NOTE — Telephone Encounter (Signed)
Patient is requesting lantus and nexium to be sent to [facility].

## 2016-01-15 NOTE — Telephone Encounter (Signed)
Sent to pharm ....

## 2016-01-20 ENCOUNTER — Telehealth: Payer: 59 | Admitting: Nurse Practitioner

## 2016-01-20 DIAGNOSIS — R112 Nausea with vomiting, unspecified: Secondary | ICD-10-CM | POA: Diagnosis not present

## 2016-01-20 MED ORDER — ONDANSETRON HCL 4 MG PO TABS: 4.0000 mg | ORAL_TABLET | Freq: Three times a day (TID) | ORAL | 0 refills | Status: DC | PRN

## 2016-01-20 NOTE — Progress Notes (Signed)
We are sorry that you are not feeling well. Here is how we plan to help!  Based on what you have shared with me it looks like you have a Virus that is irritating your GI tract.  Vomiting is the forceful emptying of a portion of the stomach's content through the mouth.  Although nausea and vomiting can make you feel miserable, it's important to remember that these are not diseases, but rather symptoms of an underlying illness.  When we treat short term symptoms, we always caution that any symptoms that persist should be fully evaluated in a medical office.  I have prescribed a medication that will help alleviate your symptoms and allow you to stay hydrated:  Zofran 4 mg 1 tablet every 8 hours as needed for nausea and vomiting  HOME CARE:  Drink clear liquids.  This is very important! Dehydration (the lack of fluid) can lead to a serious complication.  Start off with 1 tablespoon every 5 minutes for 8 hours.  You may begin eating bland foods after 8 hours without vomiting.  Start with saltine crackers, white bread, rice, mashed potatoes, applesauce.  After 48 hours on a bland diet, you may resume a normal diet.  Try to go to sleep.  Sleep often empties the stomach and relieves the need to vomit.  GET HELP RIGHT AWAY IF:   Your symptoms do not improve or worsen within 2 days after treatment.  You have a fever for over 3 days.  You cannot keep down fluids after trying the medication.  MAKE SURE YOU:   Understand these instructions.  Will watch your condition.  Will get help right away if you are not doing well or get worse.   Thank you for choosing an e-visit. Your e-visit answers were reviewed by a board certified advanced clinical practitioner to complete your personal care plan. Depending upon the condition, your plan could have included both over the counter or prescription medications. Please review your pharmacy choice. Be sure that the pharmacy you have chosen is open so  that you can pick up your prescription now.  If there is a problem you may message your provider in MyChart to have the prescription routed to another pharmacy. Your safety is important to us. If you have drug allergies check your prescription carefully.  For the next 24 hours, you can use MyChart to ask questions about today's visit, request a non-urgent call back, or ask for a work or school excuse from your e-visit provider. You will get an e-mail in the next two days asking about your experience. I hope that your e-visit has been valuable and will speed your recovery.    

## 2016-01-30 ENCOUNTER — Encounter: Payer: Self-pay | Admitting: Neurology

## 2016-01-30 ENCOUNTER — Ambulatory Visit (INDEPENDENT_AMBULATORY_CARE_PROVIDER_SITE_OTHER): Payer: 59 | Admitting: Neurology

## 2016-01-30 VITALS — BP 118/64 | HR 91 | Wt 141.0 lb

## 2016-01-30 DIAGNOSIS — R296 Repeated falls: Secondary | ICD-10-CM | POA: Diagnosis not present

## 2016-01-30 DIAGNOSIS — G43009 Migraine without aura, not intractable, without status migrainosus: Secondary | ICD-10-CM

## 2016-01-30 MED ORDER — TOPIRAMATE 50 MG PO TABS: 150.0000 mg | ORAL_TABLET | Freq: Every day | ORAL | 2 refills | Status: DC

## 2016-01-30 NOTE — Patient Instructions (Addendum)
1.  Increase topiramate to 3 tablets (150mg  total) at bedtime.  Contact me in 4 weeks with update and we can increase dose if needed. 2.  Use the sumatriptan 6mg  injection.  Inject once at earliest onset of headache and may repeat once in one hour if needed (do not exceed two injections in 24 hours.  Please provide sample 3.  Will get MRI of brain with and without contrast to assess for falls. 4.  Follow up in 3 months.  Further recommendations pending results of MRI.

## 2016-01-30 NOTE — Progress Notes (Addendum)
NEUROLOGY FOLLOW UP OFFICE NOTE  Rachel [name] [id number]  HISTORY OF PRESENT ILLNESS: Rachel [name] is a 50 year old right-handed female with type 2 diabetes, hyperlipidemia, GERD, asthma, intermittent vertigo, and migraine who follows up for migraine as well as a new issue, frequent falls.  UPDATE: Intensity:  8/10 Duration:  All day Frequency:  6-7 days a month Current NSAIDS:  Naproxen (for back pain) Current analgesics:  Tramadol (for back pain) Current triptans:  Maxalt 10mg  (dizziness, ineffective) Current anti-emetic:  Zofran ODT 8mg  Current muscle relaxants: Robaxin   Current anti-anxiolytic:  no Current sleep aide:  no Current Antihypertensive medications:  no Current Antidepressant medications:  no Current Anticonvulsant medications:  Topamax 100mg , gabapentin 200mg  Current Vitamins/Herbal/Supplements:  no Current Antihistamines/Decongestants:  no Other therapy:  No  For 2 or 3 years, she has had falls, but they have become more frequent over the past year.  Previously, she was diagnosed with vertigo.  However, she reports that she doesn't note spinning or lightheadedness.  In November, she fell when she sat down and missed the seat, hitting the back of her head.  She fell again while walking in December.  She does not know why.  She says she "just falls".  In June, she fell three more times and last fell a few weeks ago.  She denies numbness in feet or weakness in legs.  On one occasion, she was pushing the seat forward while sitting and just fell over.  Otherwise, it always occurs while walking or on her feet.  She denies double vision or focal numbness or weakness.  She has had PT, which was ineffective.  She does report that she sometimes trips while walking but doesn't fall.  For her back pain, she is taking tramadol, gabapentin and naproxen.   Also, she takes turmeric.  She previously took meloxicam.   Cervical plain films from 05/07/15 were personally reviewed and showed  mild degenerative disc disease at C5-6, but otherwise unremarkable.  Lumbar films from 06/10/15 were personally reviewed and were negative.  HISTORY: Onset:  2011.  She does have remote history of headaches when she was younger. Location:  Back of head and radiates to the front Quality:  Shooting up from back of head, pounding on top and front of head Intensity:  8/10 Aura:  no Prodrome:  no Associated symptoms:  Nausea, photophobia, phonophobia, blurred vision (she gets annual eye exams due to diabetes) Duration:  All day Frequency:  Almost daily (cannot function 6 days per month) Triggers/exacerbating factors:  unknown Relieving factors:  No Activity:  Cannot work about 6 days per month (she works as an Advertising account planner)  Past abortive medication:  Sumatriptan 100mg  (ineffective), Cambia (made headache worse), Excedrin, naproxen, Tylenol, Fioricet, Cambia Past preventative medication:  none Other past therapy:  none  She has longstanding history of dizziness with sudden falls, without loss of consciousness.  She was evaluated by neurology.  MRI of the brain with seizure protocol was performed on 08/02/11, which was unremarkable.  Sleep deprived EEG was normal.  Spells were believed to be related to positional vertigo and she was send for vestibular rehab.    PAST MEDICAL HISTORY: Past Medical History:  Diagnosis Date  . ALLERGIC RHINITIS 10/04/2007  . Anemia 01/21/2011  . ASTHMA 08/02/2007   INHALERS ONLY IN [place]  . Blood transfusion without reported diagnosis    with first child   . COMMON MIGRAINE 05/15/2009  . DIABETES MELLITUS, TYPE II 08/02/2007  .  GERD 08/02/2007  . HYPERLIPIDEMIA 08/02/2007  . INSOMNIA-SLEEP DISORDER-UNSPEC 01/04/2008  . INTERMITTENT VERTIGO 05/15/2009  . LIBIDO, DECREASED 01/09/2010    MEDICATIONS: Current Outpatient Prescriptions on File Prior to Visit  Medication Sig Dispense Refill  . aspirin 81 MG EC tablet Take 81 mg by mouth daily.      .  butalbital-acetaminophen-caffeine (FIORICET, ESGIC) 50-325-40 MG tablet TAKE 1 TABLET BY MOUTH EVERY 6 HOURS AS NEEDED FOR HEADACHE (MAX 1-2 TABLETS PER DAY) 30 tablet 1  . Diclofenac Potassium 50 MG PACK Take 50 mg by mouth once as needed. Take once daily as needed with headache onset. Please take with food 9 each 3  . esomeprazole (NEXIUM) 40 MG capsule Take 1 capsule (40 mg total) by mouth daily. 90 capsule 1  . gabapentin (NEURONTIN) 100 MG capsule Take 2 capsules (200 mg total) by mouth at bedtime. 60 capsule 3  . glipiZIDE (GLUCOTROL) 5 MG tablet Take 1 tablet (5 mg total) by mouth 2 (two) times daily before a meal. 60 tablet 3  . Insulin Glargine (LANTUS SOLOSTAR) 100 UNIT/ML Solostar Pen Inject 36 Units into the skin daily at 10 pm. 15 mL 1  . metFORMIN (GLUCOPHAGE XR) 500 MG 24 hr tablet Take 2 tablets (1,000 mg total) by mouth 2 (two) times daily with a meal. 120 tablet 5  . methocarbamol (ROBAXIN) 500 MG tablet Take 1 tablet (500 mg total) by mouth 2 (two) times daily as needed for muscle spasms. 30 tablet 2  . naproxen (NAPROSYN) 500 MG tablet Take 1 tablet (500 mg total) by mouth 2 (two) times daily with a meal. 60 tablet 3  . ondansetron (ZOFRAN) 4 MG tablet Take 1 tablet (4 mg total) by mouth every 8 (eight) hours as needed for nausea or vomiting. 20 tablet 0  . ondansetron (ZOFRAN-ODT) 8 MG disintegrating tablet Take 1 tablet (8 mg total) by mouth every 12 (twelve) hours as needed for nausea or vomiting. 20 tablet 5  . rizatriptan (MAXALT-MLT) 10 MG disintegrating tablet Take 1tablet at earliest onset of headache.  May repeat once in 2 hours if needed.  Do note exceed 2 tablets in 24 hrs. 9 tablet 11  . rosuvastatin (CRESTOR) 40 MG tablet Take 1 tablet (40 mg total) by mouth daily. 90 tablet 3  . sitaGLIPtin (JANUVIA) 100 MG tablet Take 1 tablet (100 mg total) by mouth daily. 90 tablet 1  . traMADol (ULTRAM) 50 MG tablet Take 1 tablet (50 mg total) by mouth every 8 (eight) hours as  needed. 60 tablet 1  . TRUEPLUS LANCETS 30G MISC USE AS DIRECTED 100 each PRN  . TRUETEST TEST test strip USE AS DIRECTED ONCE DAILY. 100 each PRN  . UNIFINE PENTIPS 32G X 4 MM MISC USE WITH LANTUS 100 each PRN   No current facility-administered medications on file prior to visit.     ALLERGIES: Allergies  Allergen Reactions  . Lipitor [Atorvastatin]     REACTION: myalgias  . Nitroglycerin     FAMILY HISTORY: Family History  Problem Relation Age of Onset  . Hypertension Father   . Diabetes Father   . Asthma Father   . Cervical cancer Maternal Aunt   . Heart disease Maternal Aunt   . Anesthesia problems Neg Hx   . Colon cancer Neg Hx     SOCIAL HISTORY: Social History   Social History  . Marital status: Married    Spouse name: [name]  . Number of children: 3  . Years of  education: Degree   Occupational History  . [place]  . [place]   Social History Main Topics  . Smoking status: Never Smoker  . Smokeless tobacco: Never Used  . Alcohol use No  . Drug use: No  . Sexual activity: Yes   Other Topics Concern  . Not on file   Social History Narrative   She has 3 daughters.   Patient has a 4 year degree.    Patient working at [employer].    Patient is married to [place].    REVIEW OF SYSTEMS: Constitutional: No fevers, chills, or sweats, no generalized fatigue, change in appetite Eyes: No visual changes, double vision, eye pain Ear, nose and throat: No hearing loss, ear pain, nasal congestion, sore throat Cardiovascular: No chest pain, palpitations Respiratory:  No shortness of breath at rest or with exertion, wheezes GastrointestinaI: No nausea, vomiting, diarrhea, abdominal pain, fecal incontinence Genitourinary:  No dysuria, urinary retention or frequency Musculoskeletal:  Neck pain, back pain, no myalgias to extremities Integumentary: No rash, pruritus, skin lesions Neurological: as above Psychiatric: No  depression, insomnia, anxiety Endocrine: No palpitations, fatigue, diaphoresis, mood swings, change in appetite, change in weight, increased thirst Hematologic/Lymphatic:  No purpura, petechiae. Allergic/Immunologic: no itchy/runny eyes, nasal congestion, recent allergic reactions, rashes  PHYSICAL EXAM: Vitals:   01/30/16 0739  BP: 118/64  Pulse: 91   General: No acute distress.  Patient appears well-groomed.  normal body habitus. Head:  Normocephalic/atraumatic Eyes:  Fundi examined but not visualized Neck: supple, bilateral paraspinal tenderness, full range of motion Heart:  Regular rate and rhythm Lungs:  Clear to auscultation bilaterally Back:bilateral paraspinal tenderness No tenderness to extremities Neurological Exam: alert and oriented to person, place, and time. Attention span and concentration intact, recent and remote memory intact, fund of knowledge intact.  Speech fluent and not dysarthric, language intact.  CN II-XII intact. Bulk and tone normal, muscle strength 5/5 throughout.  Sensation to light touch, temperature and vibration intact.  Deep tendon reflexes 2+ throughout, toes downgoing.  Finger to nose and heel to shin testing intact.  Gait normal, Romberg negative.  IMPRESSION: Migraine without aura, improved frequency  Frequent falls.  She has previous history of falls attributed to vertigo.  However, she does not report dizziness or vestibular symptoms.  She does not exhibit focal abnormalities or signs of myelopathy.  I question if it could be side effect of topamax, however her symptoms are not related to ataxia or fogginess.  She has high-arched feet but does not exhibit signs or symptoms of peripheral neuropathy.  PLAN: 1.  Will increase topiramate to 150mg  at bedtime to see if we can reduce frequency a little more. 2.  Sumatriptan 6mg  [place] for abortive therapy 3.  Will get MRI of brain with and without contrast to evaluate falls.  If unremarkable, would consider  discontinuing topamax and starting an antidepressant such as nortriptyline or venlafaxine instead. 4.  Follow up in 3 months.  28 minutes spent face to face with patient, over 50% spent counseling.  Metta Clines, DO  CC:  Cathlean Cower, MD

## 2016-02-04 ENCOUNTER — Other Ambulatory Visit: Payer: Self-pay | Admitting: Nurse Practitioner

## 2016-02-05 MED ORDER — ONDANSETRON HCL 4 MG PO TABS: 4.0000 mg | ORAL_TABLET | Freq: Three times a day (TID) | ORAL | 0 refills | Status: DC | PRN

## 2016-02-09 NOTE — Progress Notes (Signed)
[name] Rachel [name], [place] [address] Phone: [phone] Subjective:    I'm seeing this patient by the request  of:  Cathlean Cower, MD   CC: Neck and lower back pain follow-up  [id number]  Rachel [name] is a 50 y.o. female coming in with complaint of neck and lower back pain. Patient has had this pain intermittently but did not follow up with me initially. Patient states recently started having worsening symptoms. Patient was seen and was having more of a polyarthralgia. Does not seem eccentric symptoms are consistent with patient's fall. Patient was sent for laboratory workup. Was found to have low iron, vitamin D as well as a positive rheumatoid factor. Patient continues to complain of significant pain. Seems worse with activity. Concern that she would not be able to do her job on a regular basis.    Imaging:all imaging that were independently visualized by me. 12/09/2015 patient did have x-rays of the left shoulder that were unremarkable. Left X-rays taken on the same date no specific findings. Patient had x-rays on 06/10/2015 of the lumbar spine were unremarkable for any bony abnormalities including arthritis. Patient also had cervical neck x-rays taken 05/07/2015 and only very mild degenerative disc disease at C5-6 Laboratory workup shows the patient does have a positive rheumatoid factor  Past Medical History:  Diagnosis Date  . ALLERGIC RHINITIS 10/04/2007  . Anemia 01/21/2011  . ASTHMA 08/02/2007   INHALERS ONLY IN [place]  . Blood transfusion without reported diagnosis    with first child   . COMMON MIGRAINE 05/15/2009  . DIABETES MELLITUS, TYPE II 08/02/2007  . GERD 08/02/2007  . HYPERLIPIDEMIA 08/02/2007  . INSOMNIA-SLEEP DISORDER-UNSPEC 01/04/2008  . INTERMITTENT VERTIGO 05/15/2009  . LIBIDO, DECREASED 01/09/2010   Past Surgical History:  Procedure Laterality Date  . CESAREAN SECTION     x 3  . CYST EXCISION     left knee  .  PAROTIDECTOMY  10/21/2011   Procedure: PAROTIDECTOMY;  Surgeon: Melida Quitter, MD;  [facility];  Service: ENT;  Laterality: Left;   Social History   Social History  . Marital status: Married    Spouse name: [name]  . Number of children: 3  . Years of education: Degree   Occupational History  . [place]  . [place]   Social History Main Topics  . Smoking status: Never Smoker  . Smokeless tobacco: Never Used  . Alcohol use No  . Drug use: No  . Sexual activity: Yes   Other Topics Concern  . None   Social History Narrative   She has 3 daughters.   Patient has a 4 year degree.    Patient working at [employer].    Patient is married to [place].   Allergies  Allergen Reactions  . Lipitor [Atorvastatin]     REACTION: myalgias  . Nitroglycerin    Family History  Problem Relation Age of Onset  . Hypertension Father   . Diabetes Father   . Asthma Father   . Cervical cancer Maternal Aunt   . Heart disease Maternal Aunt   . Anesthesia problems Neg Hx   . Colon cancer Neg Hx     Past medical history, social, surgical and family history all reviewed in electronic medical record.  No pertanent information unless stated regarding to the chief complaint.   Review of Systems: No headache, visual changes, nausea, vomiting, diarrhea, constipation, dizziness, abdominal pain, skin  rash, fevers, chills, night sweats, weight loss, swollen lymph nodes, body aches, joint swelling, muscle aches, chest pain, shortness of breath, mood changes.   Objective  Blood pressure 104/76, pulse 83, weight 140 lb (63.5 kg), SpO2 98 %.  General: No apparent distress alert and oriented x3 mood and affect normal, dressed appropriately.  HEENT: Pupils equal, extraocular movements intact  Respiratory: Patient's speak in full sentences and does not appear short of breath  Cardiovascular: No lower extremity edema, non tender, no erythema  Skin: Warm dry intact with  no signs of infection or rash on extremities or on axial skeleton.  Abdomen: Soft nontender  Neuro: Cranial nerves II through XII are intact, neurovascularly intact in all extremities with 2+ DTRs and 2+ pulses.  Lymph: No lymphadenopathy of posterior or anterior cervical chain or axillae bilaterally.  Gait normal with good balance and coordination.  MSK:  Non tender with full range of motion and good stability and symmetric strength and tone of shoulders, elbows, wrist, hip, knee and ankles bilaterally.  Neck: Inspection unremarkable. No palpable stepoffs. Negative Spurling's maneuver. Full neck range of motion Grip strength and sensation normal in bilateral hands Strength good C4 to T1 distribution No sensory change to C4 to T1 Negative Hoffman sign bilaterally Reflexes normal Continued tenderness to palpation even to light sensation.  Back Exam:  Inspection: Unremarkable  Motion: Flexion 35 deg, Extension 25 deg, Side Bending to 25 deg bilaterally,  Rotation to 35 deg bilaterally no change in range of motion. SLR laying: Negative  XSLR laying: Negative  Palpable tenderness: Continued tenderness to palpation in the paraspinal musculature.. 18 of 20 trigger points that are consistent with fibromyalgia and noted positive today FABER: Positive bilateral Sensory change: Gross sensation intact to all lumbar and sacral dermatomes.  Reflexes: 2+ at both patellar tendons, 2+ at achilles tendons, Babinski's downgoing.  Strength at foot  Plantar-flexion: 5/5 Dorsi-flexion: 5/5 Eversion: 5/5 Inversion: 5/5  Leg strength  Quad: 5/5 Hamstring: 5/5 Hip flexor: 5/5 Hip abductors: 5/5  Gait unremarkable.      Impression and Recommendations:     This case required medical decision making of moderate complexity.      Note: This dictation was prepared with Dragon dictation along with smaller phrase technology. Any transcriptional errors that result from this process are unintentional.

## 2016-02-10 ENCOUNTER — Encounter: Payer: Self-pay | Admitting: Family Medicine

## 2016-02-10 ENCOUNTER — Ambulatory Visit (INDEPENDENT_AMBULATORY_CARE_PROVIDER_SITE_OTHER): Payer: 59 | Admitting: Family Medicine

## 2016-02-10 ENCOUNTER — Ambulatory Visit
Admission: RE | Admit: 2016-02-10 | Discharge: 2016-02-10 | Disposition: A | Discharge status: Home / Self Care | Payer: 59 | Source: Ambulatory Visit | Attending: Neurology | Admitting: Neurology

## 2016-02-10 ENCOUNTER — Encounter: Payer: Self-pay | Admitting: *Deleted

## 2016-02-10 DIAGNOSIS — F329 Major depressive disorder, single episode, unspecified: Secondary | ICD-10-CM

## 2016-02-10 DIAGNOSIS — R296 Repeated falls: Secondary | ICD-10-CM | POA: Diagnosis not present

## 2016-02-10 DIAGNOSIS — F32A Depression, unspecified: Secondary | ICD-10-CM

## 2016-02-10 DIAGNOSIS — R768 Other specified abnormal immunological findings in serum: Secondary | ICD-10-CM | POA: Diagnosis not present

## 2016-02-10 DIAGNOSIS — D509 Iron deficiency anemia, unspecified: Secondary | ICD-10-CM | POA: Diagnosis not present

## 2016-02-10 HISTORY — DX: Other specified abnormal immunological findings in serum: R76.8

## 2016-02-10 MED ORDER — MELOXICAM 15 MG PO TABS: 15.0000 mg | ORAL_TABLET | Freq: Every day | ORAL | 0 refills | Status: DC

## 2016-02-10 NOTE — Assessment & Plan Note (Signed)
Patient will discuss with primary care provider. Does have some underlying depression and is on Topamax. Possible change in medications could be beneficial.

## 2016-02-10 NOTE — Assessment & Plan Note (Signed)
Encourage iron supplementation.

## 2016-02-10 NOTE — Patient Instructions (Addendum)
God to see you  Start vitamin D 2000 IU daily over the counter Ice is your friend.  Meloxicam daily for 10 days then if you like it can continue daily  Rheumatology will be calling you  Stay active.  See me again in 6 weeks.

## 2016-02-10 NOTE — Assessment & Plan Note (Signed)
Chronic polymyalgia with no significant signs that are consistent with this time I do think that further evaluation by rheumatologist will be beneficial in patient was referred today. We discussed icing regimen and home exercises. We discussed which activities to do a which was to avoid. Patient will continue to try to stay active. Patient is seen other providers for other comorbidities but I do not think any of these seems to be associated. Differential includes fibromyalgia, appreciate rheumatology input. Discussed with patient that we can see her otherwise but I do not think that I have anything else. For her.

## 2016-02-17 ENCOUNTER — Telehealth: Payer: Self-pay

## 2016-02-17 NOTE — Telephone Encounter (Signed)
-----   Message from Pieter Partridge, DO sent at 02/16/2016 11:03 AM EDT ----- Corey Skains, the MRI is unremarkable.  To see if topamax is causing the falls, we can switch it to another headache medication.    If she wishes to make this change, then I would start venlafaxine ER 37.5mg  daily for 1 week, then increase to 75mg  daily.   At the same time, we will decrease topamax back to 100mg  daily for 3 days, then 50mg  daily for 4 days, then stop.

## 2016-02-17 NOTE — Telephone Encounter (Signed)
Message sent via my chart

## 2016-02-18 ENCOUNTER — Ambulatory Visit: Payer: Self-pay | Admitting: Neurology

## 2016-02-21 ENCOUNTER — Other Ambulatory Visit: Payer: Self-pay | Admitting: Family Medicine

## 2016-02-21 ENCOUNTER — Other Ambulatory Visit: Payer: Self-pay | Admitting: Nurse Practitioner

## 2016-02-23 MED ORDER — VENLAFAXINE HCL ER 37.5 MG PO CP24: ORAL_CAPSULE | ORAL | 1 refills | Status: DC

## 2016-02-23 MED ORDER — MELOXICAM 15 MG PO TABS: 15.0000 mg | ORAL_TABLET | Freq: Every day | ORAL | 0 refills | Status: DC

## 2016-02-23 NOTE — Telephone Encounter (Signed)
RX sent to pharmacy. Discontinued Topamax on med list. Message was sent back to pt making her aware. And reiterating how to come off Topamax.

## 2016-03-10 ENCOUNTER — Telehealth: Payer: Self-pay

## 2016-03-10 NOTE — Telephone Encounter (Signed)
FMLA completed, signed and faxed. Original placed in cabinet, copy sent to scan. Pt advised of same

## 2016-03-25 ENCOUNTER — Other Ambulatory Visit: Payer: Self-pay | Admitting: Nurse Practitioner

## 2016-03-25 ENCOUNTER — Other Ambulatory Visit: Payer: Self-pay | Admitting: Family Medicine

## 2016-03-25 MED ORDER — MELOXICAM 15 MG PO TABS: 15.0000 mg | ORAL_TABLET | Freq: Every day | ORAL | 0 refills | Status: DC

## 2016-03-25 NOTE — Telephone Encounter (Signed)
Refill done.  

## 2016-05-07 ENCOUNTER — Other Ambulatory Visit: Payer: Self-pay | Admitting: Internal Medicine

## 2016-05-07 DIAGNOSIS — E119 Type 2 diabetes mellitus without complications: Secondary | ICD-10-CM

## 2016-05-11 ENCOUNTER — Other Ambulatory Visit: Payer: Self-pay | Admitting: Internal Medicine

## 2016-05-11 NOTE — Telephone Encounter (Signed)
Done hardcopy to Corinne  

## 2016-05-12 ENCOUNTER — Ambulatory Visit (INDEPENDENT_AMBULATORY_CARE_PROVIDER_SITE_OTHER): Payer: 59 | Admitting: Neurology

## 2016-05-12 ENCOUNTER — Encounter: Payer: Self-pay | Admitting: Neurology

## 2016-05-12 VITALS — BP 112/62 | HR 68 | Ht 60.0 in | Wt 146.0 lb

## 2016-05-12 DIAGNOSIS — G43709 Chronic migraine without aura, not intractable, without status migrainosus: Secondary | ICD-10-CM | POA: Diagnosis not present

## 2016-05-12 DIAGNOSIS — R296 Repeated falls: Secondary | ICD-10-CM | POA: Diagnosis not present

## 2016-05-12 MED ORDER — VENLAFAXINE HCL ER 75 MG PO CP24: ORAL_CAPSULE | ORAL | 0 refills | Status: DC

## 2016-05-12 MED ORDER — SUMATRIPTAN SUCCINATE 3 MG/0.5ML ~~LOC~~ SOAJ: 1.0000 | Freq: Once | SUBCUTANEOUS | 0 refills | Status: DC

## 2016-05-12 NOTE — Patient Instructions (Addendum)
1.  We will restart venlafaxine.  Take 75mg  daily for 1 week and then increase to 150mg  daily. 2.  We will set you up for Botox.   3.  Stop butalbital-acetaminophen.   4.  At earliest onset of migraine headache, inject the [facility] SymTouch.  May repeat dose once after 1 hour if headache persists or returns (do not exceed two injections in 24 hours). Sample given. RX sent to the pharmacy.  5.  Try to limit pain medication to no more than 2 days out of the week 6.  Drink plenty of water.  Do not drink soda. 7.  We will get nerve studies of the feet to assess for neuropathy (or nerve damage), which may be a cause of falls. We will call you with this appointment.

## 2016-05-12 NOTE — Progress Notes (Signed)
NEUROLOGY FOLLOW UP OFFICE NOTE  Rachel [name] OY:[id number]  HISTORY OF PRESENT ILLNESS: Rachel [name] is a 50 year old right-handed female with type 2 diabetes, hyperlipidemia, GERD, asthma, intermittent vertigo, and migraine who follows up for migraine and frequent falls.   UPDATE:  To further evaluate more frequent falls, she underwent MRI of brain with and without contrast on 02/10/16, which was personally reviewed and was stable compared to prior MRI from 2013.  She took venlafaxine 75mg  daily for 2 months and then stopped because it was ineffective.  She has been having daly headaches.  She takes Fioricet daily.  She also takes naproxen and tramadol for back pain.  She is being seen by Dr. [name] for neck pain.  She has a positive RF and is supposed to see rheumatology.  Intensity:  8/10 Duration:  All day Frequency:  6-7 days a month Current NSAIDS:  Naproxen (for back pain) Current analgesics:  Tramadol (for back pain), Fioricet Current triptans: no Current anti-emetic:  Zofran ODT 8mg  Current muscle relaxants: Robaxin   Current anti-anxiolytic:  no Current sleep aide:  no Current Antihypertensive medications:  no Current Antidepressant medications:  no Current Anticonvulsant medications:  gabapentin 200mg  Current Vitamins/Herbal/Supplements:  turmeric Current Antihistamines/Decongestants:  no Other therapy:  No  HISTORY: Migraines: Onset:  2011.  She does have remote history of headaches when she was younger. Location:  Back of head and radiates to the front Quality:  Shooting up from back of head, pounding on top and front of head Intensity:  8/10 Aura:  no Prodrome:  no Associated symptoms:  Nausea, photophobia, phonophobia, blurred vision (she gets annual eye exams due to diabetes) Duration:  All day Frequency:  Almost daily (cannot function 6 days per month) Triggers/exacerbating factors:  unknown Relieving factors:  No Activity:  Cannot work about 6 days per month  (she works as an Advertising account planner)   Past abortive medication:  Sumatriptan 100mg  (ineffective), Maxalt (dizziness, ineffective), Cambia (made headache worse), Excedrin, naproxen, Tylenol, Fioricet, Cambia Past preventative medication:  topiramate Other past therapy:  none   Frequent Falls: For 2 or 3 years, she has had falls, but they have become more frequent over the past year.  Previously, she was diagnosed with vertigo.  However, she reports that she doesn't note spinning or lightheadedness.  She says she "just falls".  On one occasion, she was pushing the seat forward while sitting and just fell over.  Otherwise, it always occurs while walking or on her feet.  She denies double vision or focal numbness or weakness.  MRI of the brain with seizure protocol was performed on 08/02/11, which was unremarkable.  Sleep deprived EEG was normal.  She has had PT, which was ineffective.  She does report that she sometimes trips while walking but doesn't fall.   Cervical plain films from 05/07/15 showed mild degenerative disc disease at C5-6, but otherwise unremarkable.  Lumbar films from 06/10/15 were personally reviewed and were negative.  PAST MEDICAL HISTORY: Past Medical History:  Diagnosis Date  . ALLERGIC RHINITIS 10/04/2007  . Anemia 01/21/2011  . ASTHMA 08/02/2007   INHALERS ONLY IN [place]  . Blood transfusion without reported diagnosis    with first child   . COMMON MIGRAINE 05/15/2009  . DIABETES MELLITUS, TYPE II 08/02/2007  . GERD 08/02/2007  . HYPERLIPIDEMIA 08/02/2007  . INSOMNIA-SLEEP DISORDER-UNSPEC 01/04/2008  . INTERMITTENT VERTIGO 05/15/2009  . LIBIDO, DECREASED 01/09/2010    MEDICATIONS: Current Outpatient Prescriptions on File Prior to  Visit  Medication Sig Dispense Refill  . aspirin 81 MG EC tablet Take 81 mg by mouth daily.      . butalbital-acetaminophen-caffeine (FIORICET, ESGIC) 50-325-40 MG tablet TAKE 1 TABLET BY MOUTH EVERY 6 HOURS AS NEEDED FOR HEADACHE **MAX 1 OR 2  TABLETS PER DAY 30 tablet 1  . esomeprazole (NEXIUM) 40 MG capsule Take 1 capsule (40 mg total) by mouth daily. 90 capsule 1  . gabapentin (NEURONTIN) 100 MG capsule Take 2 capsules (200 mg total) by mouth at bedtime. 60 capsule 3  . glipiZIDE (GLUCOTROL) 5 MG tablet Take 1 tablet (5 mg total) by mouth 2 (two) times daily before a meal. 60 tablet 3  . Insulin Glargine (LANTUS SOLOSTAR) 100 UNIT/ML Solostar Pen Inject 36 Units into the skin daily at 10 pm. 15 mL 1  . meloxicam (MOBIC) 15 MG tablet Take 1 tablet (15 mg total) by mouth daily. 30 tablet 0  . metFORMIN (GLUCOPHAGE-XR) 500 MG 24 hr tablet TAKE 2 TABLETS BY MOUTH 2 TIMES DAILY WITH A MEAL. 120 tablet 5  . methocarbamol (ROBAXIN) 500 MG tablet Take 1 tablet (500 mg total) by mouth 2 (two) times daily as needed for muscle spasms. 30 tablet 2  . ondansetron (ZOFRAN) 4 MG tablet Take 1 tablet (4 mg total) by mouth every 8 (eight) hours as needed for nausea or vomiting. 20 tablet 0  . rizatriptan (MAXALT-MLT) 10 MG disintegrating tablet Take 1tablet at earliest onset of headache.  May repeat once in 2 hours if needed.  Do note exceed 2 tablets in 24 hrs. 9 tablet 11  . rosuvastatin (CRESTOR) 40 MG tablet Take 1 tablet (40 mg total) by mouth daily. 90 tablet 3  . sitaGLIPtin (JANUVIA) 100 MG tablet Take 1 tablet (100 mg total) by mouth daily. 90 tablet 1  . traMADol (ULTRAM) 50 MG tablet Take 1 tablet (50 mg total) by mouth every 8 (eight) hours as needed. 60 tablet 1  . TRUE METRIX BLOOD GLUCOSE TEST test strip USE AS DIRECTED ONCE DAILY 100 each PRN  . TRUEPLUS LANCETS 30G MISC USE AS DIRECTED 100 each PRN  . UNIFINE PENTIPS 32G X 4 MM MISC USE WITH LANTUS 100 each PRN   No current facility-administered medications on file prior to visit.     ALLERGIES: Allergies  Allergen Reactions  . Lipitor [Atorvastatin]     REACTION: myalgias  . Nitroglycerin     FAMILY HISTORY: Family History  Problem Relation Age of Onset  . Hypertension  Father   . Diabetes Father   . Asthma Father   . Cervical cancer Maternal Aunt   . Heart disease Maternal Aunt   . Anesthesia problems Neg Hx   . Colon cancer Neg Hx     SOCIAL HISTORY: Social History   Social History  . Marital status: Married    Spouse name: [name]  . Number of children: 3  . Years of education: Degree   Occupational History  . [place]  . [place]   Social History Main Topics  . Smoking status: Never Smoker  . Smokeless tobacco: Never Used  . Alcohol use No  . Drug use: No  . Sexual activity: Yes   Other Topics Concern  . Not on file   Social History Narrative   She has 3 daughters.   Patient has a 4 year degree.    Patient working at [employer].    Patient is married to  Juan.    REVIEW OF SYSTEMS: Constitutional: No fevers, chills, or sweats, no generalized fatigue, change in appetite Eyes: No visual changes, double vision, eye pain Ear, nose and throat: No hearing loss, ear pain, nasal congestion, sore throat Cardiovascular: No chest pain, palpitations Respiratory:  No shortness of breath at rest or with exertion, wheezes GastrointestinaI: No nausea, vomiting, diarrhea, abdominal pain, fecal incontinence Genitourinary:  No dysuria, urinary retention or frequency Musculoskeletal:  Neck pain, back pain Integumentary: No rash, pruritus, skin lesions Neurological: as above Psychiatric: No depression, insomnia, anxiety Endocrine: No palpitations, fatigue, diaphoresis, mood swings, change in appetite, change in weight, increased thirst Hematologic/Lymphatic:  No purpura, petechiae. Allergic/Immunologic: no itchy/runny eyes, nasal congestion, recent allergic reactions, rashes  PHYSICAL EXAM: Vitals:   05/12/16 1100  BP: 112/62  Pulse: 68   General: No acute distress.  Patient appears well-groomed.  normal body habitus. Head:  Normocephalic/atraumatic Eyes:  Fundi examined but not visualized Neck:  supple,  paraspinal tenderness, full range of motion Heart:  Regular rate and rhythm Lungs:  Clear to auscultation bilaterally Back: paraspinal tenderness Neurological Exam: alert and oriented to person, place, and time. Attention span and concentration intact, recent and remote memory intact, fund of knowledge intact.  Speech fluent and not dysarthric, language intact.  CN II-XII intact. Bulk and tone normal, muscle strength 5/5 throughout.  Sensation to pinprick reduced in toes and bottom of feet, vibration intact.  Deep tendon reflexes 2+ throughout, toes downgoing.  Finger to nose and heel to shin testing intact.  Gait normal, Romberg negative.  IMPRESSION: Migraine without aura, improved frequency   Frequent falls.  She has previous history of falls attributed to vertigo.  However, she does not report dizziness or vestibular symptoms.  She does not exhibit focal abnormalities or signs of myelopathy.  I question if it could be side effect of topamax, however her symptoms are not related to ataxia or fogginess.  She has high-arched feet but does not exhibit signs or symptoms of peripheral neuropathy.  PLAN: 1.  Informed her that she didn't optimize therapy with venlafaxine and that there was room to increase dose.  Will restart, titrating to 150mg  daily. 2.  She is a candidate for Botox.  She has had over 3 months of almost daily headaches and has failed several preventatives (topiramate, gabapentin, venlafaxine). 3.  Stop Fioricet due to rebound effect.  She will try sumatriptan injection (Zembrace SymTouch) for abortive therapy. 4.  I have no explanation for falls.  MRI of brain is unremarkable and cervical plain films are not suggestive of significant stenosis that would be causing myelopathy (she doesn't exhibit myelopathic signs or symptoms anyway).  She has high-arched feet.  Although I don't appreciate any proprioception deficits, I think it warrants NCV-EMG to evaluate for any underlying  neuropathy that may be a cause for falls   25 minutes spent face to face with patient, over 50% spent counseling.  Metta Clines, DO  CC:  Cathlean Cower, MD

## 2016-05-12 NOTE — Telephone Encounter (Signed)
Faxed script back to [facility] pharmacy..../lmb  

## 2016-05-13 ENCOUNTER — Ambulatory Visit (INDEPENDENT_AMBULATORY_CARE_PROVIDER_SITE_OTHER): Payer: 59 | Admitting: Neurology

## 2016-05-13 ENCOUNTER — Other Ambulatory Visit: Payer: Self-pay | Admitting: Nurse Practitioner

## 2016-05-13 DIAGNOSIS — R296 Repeated falls: Secondary | ICD-10-CM | POA: Diagnosis not present

## 2016-05-13 DIAGNOSIS — R2681 Unsteadiness on feet: Secondary | ICD-10-CM

## 2016-05-13 NOTE — Progress Notes (Signed)
Ok to contact pt - nerve test to the legs was normal; no further treatment needed

## 2016-05-13 NOTE — Procedures (Signed)
[facility]  [address], [address]  [place], [place] [address] Tel: [phone] Fax:  [phone] Test Date:  05/13/2016  Patient: Rachel [name] DOB: 09/26/1965 Physician: Narda Amber, DO  Sex: Female Height: 5\' 0"  Ref Phys: Metta Clines, D.O.  ID#: [id number] Temp: 32.2C Technician: Jerilynn Mages. Dean   Patient Complaints: This is a 50 year old female referred for evaluation of gait instability and falls.  NCV & EMG Findings: Extensive electrodiagnostic testing of the right lower extremity and additional studies of the left shows:  1. Bilateral sural and superficial peroneal sensory responses are within normal limits.  2. Bilateral peroneal and tibial motor responses are within normal limits.  3. Bilateral tibial H reflex studies are within normal limits.  4. There is no evidence of active or chronic motor axon loss changes affecting any of the tested muscles. Motor unit configuration and recruitment pattern is within normal limits.   Impression: This is a normal study. In particular, there is no evidence of a large fiber sensorimotor polyneuropathy or lumbosacral radiculopathy.   ___________________________ Narda Amber, DO    Nerve Conduction Studies Anti Sensory Summary Table   Stim Site NR Peak (ms) Norm Peak (ms) P-T Amp (V) Norm P-T Amp  Left Sup Peroneal Anti Sensory (Ant Lat Mall)  32.2C  12 cm    3.1 <4.6 22.3 >4  Right Sup Peroneal Anti Sensory (Ant Lat Mall)  32.2C  12 cm    3.0 <4.6 13.5 >4  Left Sural Anti Sensory (Lat Mall)  32.2C  Calf    3.1 <4.6 37.6 >4  Right Sural Anti Sensory (Lat Mall)  32.2C  Calf    2.9 <4.6 38.9 >4   Motor Summary Table   Stim Site NR Onset (ms) Norm Onset (ms) O-P Amp (mV) Norm O-P Amp Site1 Site2 Delta-0 (ms) Dist (cm) Vel (m/s) Norm Vel (m/s)  Left Peroneal Motor (Ext Dig Brev)  32.2C  Ankle    3.3 <6.0 5.5 >2.5 B Fib Ankle 6.2 30.0 48 >40  B Fib    9.5  5.2  Poplt B Fib 1.8 10.0 56 >40  Poplt    11.3  4.9           Right Peroneal Motor (Ext Dig Brev)  32.2C  Ankle    3.4 <6.0 3.9 >2.5 B Fib Ankle 6.4 29.0 45 >40  B Fib    9.8  3.5  Poplt B Fib 1.7 10.0 59 >40  Poplt    11.5  3.3         Left Tibial Motor (Abd Hall Brev)  32.2C  Ankle    3.3 <6.0 6.9 >4 Knee Ankle 7.3 37.0 51 >40  Knee    10.6  5.4         Right Tibial Motor (Abd Hall Brev)  32.2C  Ankle    3.0 <6.0 9.8 >4 Knee Ankle 7.6 37.0 49 >40  Knee    10.6  6.9          H Reflex Studies   NR H-Lat (ms) Lat Norm (ms) L-R H-Lat (ms) M-Lat (ms) HLat-MLat (ms)  Left Tibial (Gastroc)  32.2C     31.56 <35 0.68 4.49 27.07  Right Tibial (Gastroc)  32.2C     30.88 <35 0.68 4.49 26.39   EMG   Side Muscle Ins Act Fibs Psw Fasc Number Recrt Dur Dur. Amp Amp. Poly Poly. Comment  Right AntTibialis Nml Nml Nml Nml Nml Nml Nml Nml Nml Nml Nml  Nml N/A  Right Gastroc Nml Nml Nml Nml Nml Nml Nml Nml Nml Nml Nml Nml N/A  Right Flex Dig Long Nml Nml Nml Nml Nml Nml Nml Nml Nml Nml Nml Nml N/A  Right RectFemoris Nml Nml Nml Nml Nml Nml Nml Nml Nml Nml Nml Nml N/A  Right BicepsFemS Nml Nml Nml Nml Nml Nml Nml Nml Nml Nml Nml Nml N/A      Waveforms:

## 2016-05-14 ENCOUNTER — Telehealth: Payer: Self-pay

## 2016-05-14 NOTE — Progress Notes (Signed)
Patient aware.

## 2016-05-14 NOTE — Telephone Encounter (Signed)
Released via mychart

## 2016-05-14 NOTE — Telephone Encounter (Signed)
-----   Message from Pieter Partridge, DO sent at 05/14/2016 11:01 AM EST ----- Nerve test is normal.  No explanation for falls.

## 2016-05-20 ENCOUNTER — Telehealth: Payer: Self-pay | Admitting: Neurology

## 2016-05-20 NOTE — Telephone Encounter (Signed)
Rachel [name] 10/15/1965. The Zembrace injection has caused her to be sick throwing up and blood pressure dropped and heart rate went up while taking Zembrace injections.  Her # [phone] [id number]. Thank you.

## 2016-05-21 MED ORDER — ELETRIPTAN HYDROBROMIDE 40 MG PO TABS: 40.0000 mg | ORAL_TABLET | ORAL | 2 refills | Status: DC | PRN

## 2016-05-21 NOTE — Telephone Encounter (Signed)
Please see message from pt. Per last OV pt has also tried, "Sumatriptan 100mg  (ineffective), Maxalt (dizziness, ineffective), Cambia (made headache worse), Excedrin, naproxen, Tylenol, Fioricet, Cambia" Please advise.

## 2016-05-21 NOTE — Telephone Encounter (Signed)
Instead, she can try Relpax 40mg  (may repeat dose once after 2 hours if needed, not to exceed 2 tablets in 24 hours)

## 2016-05-21 NOTE — Telephone Encounter (Signed)
Message relayed to patient. Verbalized understanding and denied questions.   

## 2016-05-27 ENCOUNTER — Other Ambulatory Visit: Payer: Self-pay | Admitting: Internal Medicine

## 2016-05-27 DIAGNOSIS — Z1231 Encounter for screening mammogram for malignant neoplasm of breast: Secondary | ICD-10-CM

## 2016-05-31 ENCOUNTER — Other Ambulatory Visit: Payer: Self-pay | Admitting: Internal Medicine

## 2016-05-31 NOTE — Telephone Encounter (Signed)
Routing to PCP, please advise

## 2016-06-01 NOTE — Telephone Encounter (Signed)
Done hardcopy to Corinne  

## 2016-06-01 NOTE — Telephone Encounter (Signed)
This has been faxed.

## 2016-06-02 ENCOUNTER — Other Ambulatory Visit: Payer: Self-pay | Admitting: *Deleted

## 2016-06-02 NOTE — Patient Outreach (Signed)
Returned call to [facility] after she left a voice message today at 1:25 pm on this RNCM's business phone.  Requested she return call tomorrow between 10 am and 1:30 pm as this will be between patient appointments. Barrington Ellison RN,CCM,CDE [place] Management Coordinator [facility] To [facility] Office Phone [phone] Office Fax [phone]

## 2016-06-10 ENCOUNTER — Other Ambulatory Visit: Payer: Self-pay | Admitting: *Deleted

## 2016-06-10 NOTE — Patient Outreach (Signed)
Spoke with [name] via phone to advise her that she will transition to the [employer] from the [employer] program for ongoing diabetes disease self management. Securely e-mailed information needed to build her personal care plan in [place] to her personal e-mail address and asked her to return the information as soon as possible. Barrington Ellison RN,CCM,CDE [place] Management Coordinator [facility] To Well[facility] Office Phone [phone] Office Fax [phone]

## 2016-06-16 ENCOUNTER — Other Ambulatory Visit: Payer: Self-pay | Admitting: *Deleted

## 2016-06-16 MED ORDER — SITAGLIPTIN PHOSPHATE 100 MG PO TABS: 100.0000 mg | ORAL_TABLET | Freq: Every day | ORAL | 1 refills | Status: DC

## 2016-06-17 DIAGNOSIS — H5213 Myopia, bilateral: Secondary | ICD-10-CM | POA: Diagnosis not present

## 2016-06-17 DIAGNOSIS — H524 Presbyopia: Secondary | ICD-10-CM | POA: Diagnosis not present

## 2016-06-21 ENCOUNTER — Other Ambulatory Visit: Payer: Self-pay | Admitting: Internal Medicine

## 2016-06-22 ENCOUNTER — Other Ambulatory Visit: Payer: Self-pay | Admitting: *Deleted

## 2016-06-22 MED ORDER — ONDANSETRON HCL 4 MG PO TABS: 4.0000 mg | ORAL_TABLET | Freq: Three times a day (TID) | ORAL | 0 refills | Status: DC | PRN

## 2016-06-22 MED ORDER — INSULIN GLARGINE 100 UNIT/ML SOLOSTAR PEN: 36.0000 [IU] | PEN_INJECTOR | Freq: Every day | SUBCUTANEOUS | 0 refills | Status: DC

## 2016-06-22 NOTE — Telephone Encounter (Signed)
Case manager left smg on triage stating that the pt call her this morning very anxious. She states her BS has been running high. She has been trying to get refills on her Lantus. She is requesting refill on lantus, and to give pt call back @ [phone]. Called pt verified msg from case manager. Pt states she had requested a refill on the lantus but pharmacy never received back. She has not had any insulin in 2 weeks. She has been taking the metformin & januvia. Inform pt she need to f/u w/MD concerning her diabetes, but will go ahead and send refill on the lantus. Made appt for this Friday @ 2:30pm.../lmb

## 2016-06-22 NOTE — Patient Outreach (Signed)
Notified Lynzy via phone that this RNCM received an in basket message from Dr. Jenny Reichmann that Lantus refills have been called to [facility] pharmacy. Barrington Ellison RN,CCM,CDE [place] Management Coordinator [facility] To Wellness Office Phone [phone] Office Fax [phone]

## 2016-06-22 NOTE — Patient Outreach (Signed)
Rachel [name] called to say she is out of insulin and has requested refills from both Dr. Jenny Reichmann and Dr. Cruzita Lederer without results and is asking this RNCM for assistance in obtaining insulin for her. Called and left message for Dr. Jenny Reichmann on the triage line at his office and also sent message via Dr. Gwynn Burly EPIC in basket requesting refill. Barrington Ellison RN,CCM,CDE [place] Management Coordinator [facility] To Well[facility] Office Phone [phone] Office Fax [phone]

## 2016-06-22 NOTE — Addendum Note (Signed)
Addended by: Earnstine Regal on: 06/22/2016 11:23 AM   Modules accepted: Orders

## 2016-06-25 ENCOUNTER — Ambulatory Visit (INDEPENDENT_AMBULATORY_CARE_PROVIDER_SITE_OTHER): Payer: 59 | Admitting: Internal Medicine

## 2016-06-25 ENCOUNTER — Other Ambulatory Visit (INDEPENDENT_AMBULATORY_CARE_PROVIDER_SITE_OTHER): Payer: 59

## 2016-06-25 ENCOUNTER — Telehealth: Payer: Self-pay

## 2016-06-25 ENCOUNTER — Encounter: Payer: Self-pay | Admitting: Internal Medicine

## 2016-06-25 VITALS — BP 128/72 | HR 96 | Resp 20 | Wt 146.0 lb

## 2016-06-25 DIAGNOSIS — Z0001 Encounter for general adult medical examination with abnormal findings: Secondary | ICD-10-CM

## 2016-06-25 DIAGNOSIS — R7989 Other specified abnormal findings of blood chemistry: Secondary | ICD-10-CM

## 2016-06-25 DIAGNOSIS — K219 Gastro-esophageal reflux disease without esophagitis: Secondary | ICD-10-CM

## 2016-06-25 DIAGNOSIS — Z23 Encounter for immunization: Secondary | ICD-10-CM | POA: Diagnosis not present

## 2016-06-25 DIAGNOSIS — Z0000 Encounter for general adult medical examination without abnormal findings: Secondary | ICD-10-CM | POA: Diagnosis not present

## 2016-06-25 DIAGNOSIS — E119 Type 2 diabetes mellitus without complications: Secondary | ICD-10-CM

## 2016-06-25 LAB — CBC WITH DIFFERENTIAL/PLATELET
Basophils Absolute: 0.1 10*3/uL (ref 0.0–0.1)
Basophils Relative: 0.6 % (ref 0.0–3.0)
Eosinophils Absolute: 0 10*3/uL (ref 0.0–0.7)
Eosinophils Relative: 0.4 % (ref 0.0–5.0)
HCT: 36.6 % (ref 36.0–46.0)
Hemoglobin: 12.3 g/dL (ref 12.0–15.0)
Lymphocytes Relative: 46.8 % — ABNORMAL HIGH (ref 12.0–46.0)
Lymphs Abs: 4.5 10*3/uL — ABNORMAL HIGH (ref 0.7–4.0)
MCHC: 33.6 g/dL (ref 30.0–36.0)
MCV: 80.5 fl (ref 78.0–100.0)
Monocytes Absolute: 0.6 10*3/uL (ref 0.1–1.0)
Monocytes Relative: 6.3 % (ref 3.0–12.0)
Neutro Abs: 4.4 10*3/uL (ref 1.4–7.7)
Neutrophils Relative %: 45.9 % (ref 43.0–77.0)
Platelets: 364 10*3/uL (ref 150.0–400.0)
RBC: 4.55 Mil/uL (ref 3.87–5.11)
RDW: 13.5 % (ref 11.5–15.5)
WBC: 9.5 10*3/uL (ref 4.0–10.5)

## 2016-06-25 LAB — BASIC METABOLIC PANEL
BUN: 10 mg/dL (ref 6–23)
CO2: 27 mEq/L (ref 19–32)
Calcium: 9.5 mg/dL (ref 8.4–10.5)
Chloride: 101 mEq/L (ref 96–112)
Creatinine, Ser: 0.68 mg/dL (ref 0.40–1.20)
GFR: 96.54 mL/min (ref >60.00)
Glucose, Bld: 260 mg/dL — ABNORMAL HIGH (ref 70–99)
Potassium: 3.9 mEq/L (ref 3.5–5.1)
Sodium: 135 mEq/L (ref 135–145)

## 2016-06-25 LAB — LIPID PANEL
Cholesterol: 215 mg/dL — ABNORMAL HIGH (ref 0–200)
HDL: 58.5 mg/dL (ref >39.00)
NonHDL: 156.06
Total CHOL/HDL Ratio: 4
Triglycerides: 296 mg/dL — ABNORMAL HIGH (ref 0.0–149.0)
VLDL: 59.2 mg/dL — ABNORMAL HIGH (ref 0.0–40.0)

## 2016-06-25 LAB — URINALYSIS, ROUTINE W REFLEX MICROSCOPIC
Bilirubin Urine: NEGATIVE
Hgb urine dipstick: NEGATIVE
Ketones, ur: NEGATIVE
Leukocytes, UA: NEGATIVE
Nitrite: NEGATIVE
RBC / HPF: NONE SEEN (ref 0–?)
Specific Gravity, Urine: 1.01 (ref 1.000–1.030)
Total Protein, Urine: NEGATIVE
Urine Glucose: >=1000 — AB
Urobilinogen, UA: 0.2 (ref 0.0–1.0)
WBC, UA: NONE SEEN (ref 0–?)
pH: 6 (ref 5.0–8.0)

## 2016-06-25 LAB — HEPATIC FUNCTION PANEL
ALT: 14 U/L (ref 0–35)
AST: 13 U/L (ref 0–37)
Albumin: 4.3 g/dL (ref 3.5–5.2)
Alkaline Phosphatase: 68 U/L (ref 39–117)
Bilirubin, Direct: 0.1 mg/dL (ref 0.0–0.3)
Total Bilirubin: 0.3 mg/dL (ref 0.2–1.2)
Total Protein: 7.2 g/dL (ref 6.0–8.3)

## 2016-06-25 LAB — MICROALBUMIN / CREATININE URINE RATIO
Creatinine,U: 50.8 mg/dL
Microalb Creat Ratio: 1.4 mg/g (ref 0.0–30.0)
Microalb, Ur: <0.7 mg/dL (ref 0.0–1.9)

## 2016-06-25 LAB — LDL CHOLESTEROL, DIRECT: Direct LDL: 122 mg/dL

## 2016-06-25 LAB — TSH: TSH: 0.9 u[IU]/mL (ref 0.35–4.50)

## 2016-06-25 LAB — HEMOGLOBIN A1C: Hgb A1c MFr Bld: 11.5 % — ABNORMAL HIGH (ref 4.6–6.5)

## 2016-06-25 MED ORDER — ESOMEPRAZOLE MAGNESIUM 40 MG PO CPDR: 40.0000 mg | DELAYED_RELEASE_CAPSULE | Freq: Two times a day (BID) | ORAL | 3 refills | Status: DC

## 2016-06-25 MED ORDER — MELOXICAM 15 MG PO TABS: 15.0000 mg | ORAL_TABLET | Freq: Every day | ORAL | 0 refills | Status: DC

## 2016-06-25 MED ORDER — SITAGLIPTIN PHOSPHATE 100 MG PO TABS: 100.0000 mg | ORAL_TABLET | Freq: Every day | ORAL | 1 refills | Status: DC

## 2016-06-25 MED ORDER — ROSUVASTATIN CALCIUM 40 MG PO TABS: 40.0000 mg | ORAL_TABLET | Freq: Every day | ORAL | 3 refills | Status: DC

## 2016-06-25 MED ORDER — ONDANSETRON HCL 4 MG PO TABS: 4.0000 mg | ORAL_TABLET | Freq: Three times a day (TID) | ORAL | 0 refills | Status: DC | PRN

## 2016-06-25 MED ORDER — GLIPIZIDE 5 MG PO TABS
5.0000 mg | ORAL_TABLET | Freq: Two times a day (BID) | ORAL | 3 refills | Status: DC
Start: 2016-06-25 — End: 2016-07-08

## 2016-06-25 MED ORDER — INSULIN GLARGINE 100 UNIT/ML SOLOSTAR PEN: 36.0000 [IU] | PEN_INJECTOR | Freq: Every day | SUBCUTANEOUS | 11 refills | Status: DC

## 2016-06-25 MED ORDER — GABAPENTIN 100 MG PO CAPS: 200.0000 mg | ORAL_CAPSULE | Freq: Every day | ORAL | 3 refills | Status: DC

## 2016-06-25 MED ORDER — METFORMIN HCL ER 500 MG PO TB24: ORAL_TABLET | ORAL | 5 refills | Status: DC

## 2016-06-25 NOTE — Assessment & Plan Note (Signed)
Mild to mod, for bid nexium,  to f/u any worsening symptoms or concerns

## 2016-06-25 NOTE — Telephone Encounter (Signed)
Refill sent to the pharmacy 

## 2016-06-25 NOTE — Assessment & Plan Note (Signed)

## 2016-06-25 NOTE — Patient Instructions (Addendum)
You had the Tetanus (Tdap) and Pneumovax pneumonia shot today  Ok to increase the nexium to twice per day  Please continue all other medications as before, and refills have been done if requested.  Please have the pharmacy call with any other refills you may need.  Please continue your efforts at being more active, low cholesterol diet, and weight control.  You are otherwise up to date with prevention measures today.  Please keep your appointments with your specialists as you may have planned  You will be contacted regarding the referral for: Dr Cruzita Lederer  Please go to the LAB in the Basement (turn left off the elevator) for the tests to be done today  You will be contacted by phone if any changes need to be made immediately.  Otherwise, you will receive a letter about your results with an explanation, but please check with MyChart first.  Please remember to sign up for MyChart if you have not done so, as this will be important to you in the future with finding out test results, communicating by private email, and scheduling acute appointments online when needed.  Please return in 6 months, or sooner if needed, with Lab testing done 3-5 days before

## 2016-06-25 NOTE — Assessment & Plan Note (Signed)
Uncontrolled chronically with recent non compliance with tx, for f/u lab today and f/u endo as planned

## 2016-06-25 NOTE — Progress Notes (Signed)
Pre visit review using our clinic review tool, if applicable. No additional management support is needed unless otherwise documented below in the visit note. 

## 2016-06-25 NOTE — Progress Notes (Signed)
Subjective:    Patient ID: Rachel [name], female    DOB: 06/25/1965, 51 y.o.   MRN: [id number]  HPI  Here for wellness and f/u;  Overall doing ok;  Pt denies Chest pain, worsening SOB, DOE, wheezing, orthopnea, PND, worsening LE edema, palpitations, dizziness or syncope.  Pt denies neurological change such as new headache, facial or extremity weakness.  Pt denies polydipsia, polyuria, or low sugar symptoms. Pt states overall good compliance with treatment and medications, good tolerability, and has been trying to follow appropriate diet.  Pt denies worsening depressive symptoms, suicidal ideation or panic. No fever, night sweats, wt loss, loss of appetite, or other constitutional symptoms.  Pt states good ability with ADL's, has low fall risk, home safety reviewed and adequate, no other significant changes in hearing or vision, and only occasionally active with exercise. Has appt next wk for mammogram.  Has chronically high sugars, dfificult to control; yesterday 296 but occas high readings, pt admits has been out of insulin for 3 wks recently. Has not followed up with endo, states unable to make appt easily.  Asks for bid nexium due to uncontrolled symptoms but Denies other abd pain, dysphagia, n/v, bowel change or blood Past Medical History:  Diagnosis Date  . ALLERGIC RHINITIS 10/04/2007  . Anemia 01/21/2011  . ASTHMA 08/02/2007   INHALERS ONLY IN [place]  . Blood transfusion without reported diagnosis    with first child   . COMMON MIGRAINE 05/15/2009  . DIABETES MELLITUS, TYPE II 08/02/2007  . GERD 08/02/2007  . HYPERLIPIDEMIA 08/02/2007  . INSOMNIA-SLEEP DISORDER-UNSPEC 01/04/2008  . INTERMITTENT VERTIGO 05/15/2009  . LIBIDO, DECREASED 01/09/2010   Past Surgical History:  Procedure Laterality Date  . CESAREAN SECTION     x 3  . CYST EXCISION     left knee  . PAROTIDECTOMY  10/21/2011   Procedure: PAROTIDECTOMY;  Surgeon: Melida Quitter, MD;  Location: [facility];  Service: ENT;  Laterality: Left;    reports that she has never smoked. She has never used smokeless tobacco. She reports that she does not drink alcohol or use drugs. family history includes Asthma in her father; Cervical cancer in her maternal aunt; Diabetes in her father; Heart disease in her maternal aunt; Hypertension in her father. Allergies  Allergen Reactions  . Lipitor [Atorvastatin]     REACTION: myalgias  . Nitroglycerin    Current Outpatient Prescriptions on File Prior to Visit  Medication Sig Dispense Refill  . aspirin 81 MG EC tablet Take 81 mg by mouth daily.      . butalbital-acetaminophen-caffeine (FIORICET, ESGIC) 50-325-40 MG tablet TAKE 1 TABLET BY MOUTH EVERY 6 HOURS AS NEEDED FOR HEADACHE **MAX 1 OR 2 TABLETS PER DAY 30 tablet 1  . eletriptan (RELPAX) 40 MG tablet Take 1 tablet (40 mg total) by mouth as needed for migraine or headache. May repeat in 2 hours if headache persists or recurs. 10 tablet 2  . methocarbamol (ROBAXIN) 500 MG tablet Take 1 tablet (500 mg total) by mouth 2 (two) times daily as needed for muscle spasms. 30 tablet 2  . rizatriptan (MAXALT-MLT) 10 MG disintegrating tablet Take 1tablet at earliest onset of headache.  May repeat once in 2 hours if needed.  Do note exceed 2 tablets in 24 hrs. 9 tablet 11  . traMADol (ULTRAM) 50 MG tablet TAKE 1 TABLET BY MOUTH EVERY 8 HOURS AS NEEDED 60 tablet 2  . TRUE METRIX BLOOD GLUCOSE TEST test strip USE AS DIRECTED ONCE DAILY 100  each PRN  . TRUEPLUS LANCETS 30G MISC USE AS DIRECTED 100 each PRN  . UNIFINE PENTIPS 32G X 4 MM MISC USE WITH LANTUS 100 each PRN  . venlafaxine XR (EFFEXOR-XR) 75 MG 24 hr capsule Take 1 pill daily with breakfast for 7 days, then increase to 2 pills daily with breakfast.  Contact us for refill. 60 capsule 0  . SUMAtriptan Succinate (ZEMBRACE SYMTOUCH) 3 MG/0.5ML SOAJ Inject 1 Device into the skin once. 1 pen 0  . SUMAtriptan Succinate (ZEMBRACE SYMTOUCH) 3 MG/0.5ML SOAJ Inject 1 Device into the skin once. May repeat dose  once after 1 hour if headache persists or returns (do not exceed two injections in 24 hours) 9 pen 0   No current facility-administered medications on file prior to visit.    Review of Systems Constitutional: Negative for increased diaphoresis, or other activity, appetite or siginficant weight change other than noted HENT: Negative for worsening hearing loss, ear pain, facial swelling, mouth sores and neck stiffness.   Eyes: Negative for other worsening pain, redness or visual disturbance.  Respiratory: Negative for choking or stridor Cardiovascular: Negative for other chest pain and palpitations.  Gastrointestinal: Negative for worsening diarrhea, blood in stool, or abdominal distention Genitourinary: Negative for hematuria, flank pain or change in urine volume.  Musculoskeletal: Negative for myalgias or other joint complaints.  Skin: Negative for other color change and wound or drainage.  Neurological: Negative for syncope and numbness. other than noted Hematological: Negative for adenopathy. or other swelling Psychiatric/Behavioral: Negative for hallucinations, SI, self-injury, decreased concentration or other worsening agitation.  All other system neg per pt    Objective:   Physical Exam BP 128/72   Pulse 96   Resp 20   Wt 146 lb (66.2 kg)   SpO2 98%   BMI 28.51 kg/m  VS noted,  Constitutional: Pt is oriented to person, place, and time. Appears well-developed and well-nourished, in no significant distress Head: Normocephalic and atraumatic  Eyes: Conjunctivae and EOM are normal. Pupils are equal, round, and reactive to light Right Ear: External ear normal.  Left Ear: External ear normal Nose: Nose normal.  Mouth/Throat: Oropharynx is clear and moist  Neck: Normal range of motion. Neck supple. No JVD present. No tracheal deviation present or significant neck LA or mass Cardiovascular: Normal rate, regular rhythm, normal heart sounds and intact distal pulses.     Pulmonary/Chest: Effort normal and breath sounds without rales or wheezing  Abdominal: Soft. Bowel sounds are normal. NT. No HSM  Musculoskeletal: Normal range of motion. Exhibits no edema Lymphadenopathy: Has no cervical adenopathy.  Neurological: Pt is alert and oriented to person, place, and time. Pt has normal reflexes. No cranial nerve deficit. Motor grossly intact Skin: Skin is warm and dry. No rash noted or new ulcers Psychiatric:  Has normal mood and affect. Behavior is normal.  No other new exam findings    Assessment & Plan:

## 2016-06-28 ENCOUNTER — Telehealth: Payer: Self-pay | Admitting: Neurology

## 2016-06-28 NOTE — Telephone Encounter (Signed)
Patient needs to check the status of the approval of the botox [phone]

## 2016-06-30 ENCOUNTER — Ambulatory Visit: Payer: 59

## 2016-07-08 ENCOUNTER — Ambulatory Visit
Admission: RE | Admit: 2016-07-08 | Discharge: 2016-07-08 | Disposition: A | Discharge status: Home / Self Care | Payer: 59 | Source: Ambulatory Visit | Attending: Internal Medicine | Admitting: Internal Medicine

## 2016-07-08 ENCOUNTER — Encounter: Payer: Self-pay | Admitting: Internal Medicine

## 2016-07-08 ENCOUNTER — Ambulatory Visit (INDEPENDENT_AMBULATORY_CARE_PROVIDER_SITE_OTHER): Payer: 59 | Admitting: Internal Medicine

## 2016-07-08 VITALS — BP 108/72 | HR 77 | Wt 148.0 lb

## 2016-07-08 DIAGNOSIS — E1165 Type 2 diabetes mellitus with hyperglycemia: Secondary | ICD-10-CM | POA: Diagnosis not present

## 2016-07-08 DIAGNOSIS — Z1231 Encounter for screening mammogram for malignant neoplasm of breast: Secondary | ICD-10-CM | POA: Diagnosis not present

## 2016-07-08 DIAGNOSIS — Z794 Long term (current) use of insulin: Secondary | ICD-10-CM

## 2016-07-08 NOTE — Telephone Encounter (Signed)
Spoke to patient.  States has not heard anything about Botox recommend in November. Apologized to patient. Advised filling in for CMA no longer here.  Patient verbalized understanding. Advised would resubmit Botox info. Patient agreed.

## 2016-07-08 NOTE — Progress Notes (Signed)
Subjective:     Patient ID: Rachel [name], female   DOB: 09-30-1965, 51 y.o.   MRN: [id number]  HPI [name] is a pleasant 51 y.o. woman, returning for f/u for DM2, dx 1987, uncontrolled, insulin-dependent, with probable complications (? peripheral neuropathy). Last visit 1 year ago! She was not compliant with appts. In the past.  Last HbA1C: Lab Results  Component Value Date   HGBA1C 11.5 (H) 06/25/2016   HGBA1C 10.9 04/29/2015   HGBA1C 10.8 (H) 01/09/2015  Prev. 10.1%.  She describes coming off all DM medicines for 6 mo before the HbA1c in 09/2013! She had increased thirst and urination and lost weight during this period: from a size 12 to 8! She restarted her meds in 06/2014.  She again ran out of all meds in 04/2016. She was w/o meds for 3 weeks. She restarted 2 weeks ago. Her sugars started to improve.  She is now back on: - Lantus 36 units at bedtime - Januvia 100 mg in am - Metformin XR 1000 mg 2x a day with b'fast and dinner She was on Glipizide 5 mg bid - added 04/2015 >> was not taking it b/c low CBGs. We stopped Glipizide XL 5 mg in 07/2014. She had episodes of hypoglycemia with Amaryl in the past. She was on Bydureon 2 mg weekly (had nausea).  She checks her sugars ~1-2 a day. She brings her meter: - am: 70-120 >> 140-150s >> 50-125 >> 179-259 >> 49x1, 122-130s, 166  >> 126 - after coffee >> 170 - before lunch (checks if does not feel good): 80-90s >> 300s (only checks when thirsty) >> n/c - before dinner: 276 - bedtime: 100-130 >> n/c >> 100-130 >> n/c >> 300-500 >> 123-150s, 190 (rice) >> n/c She has hypoglycemia awareness in the 90s now. She can have lows at night >> 50. Highest 600 >> 190 >> HI but 300s recently.  - She has seen nutrition   - last lipids: Lab Results  Component Value Date   CHOL 215 (H) 06/25/2016   HDL 58.50 06/25/2016   LDLCALC 198 (H) 01/09/2015   LDLDIRECT 122.0 06/25/2016   TRIG 296.0 (H) 06/25/2016   CHOLHDL 4 06/25/2016  On  Simvastatin. - No CKD: Lab Results  Component Value Date   BUN 10 06/25/2016   Lab Results  Component Value Date   CREATININE 0.68 06/25/2016  - Has neuropathy R leg.  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  Review of Systems Constitutional: + weight loss, + fatigue, no heat or cold intolerance, no excessive urination Eyes: no blurry vision, no xerophthalmia ENT: no sore throat, no nodules palpated in throat, no dysphagia/no odynophagia, no hoarseness Cardiovascular: no CP/no SOB/palpitations/leg swelling Respiratory: no cough/SOB/+ wheezing Gastrointestinal: no N/V/D/C/+ heartburn Musculoskeletal: no muscle/joint aches Skin: no rashes Neurological: no tremors/numbness/tingling/dizziness, + HA + Low libido I reviewed pt's medications, allergies, PMH, social hx, family hx, and changes were documented in the history of present illness. Otherwise, unchanged from my initial visit note.  Objective:   Physical Exam BP 108/72 (BP Location: Left Arm, Patient Position: Sitting)   Pulse 77   Wt 148 lb (67.1 kg)   SpO2 98%   BMI 28.90 kg/m  Body mass index is 28.9 kg/m. Wt Readings from Last 3 Encounters:  07/08/16 148 lb (67.1 kg)  06/25/16 146 lb (66.2 kg)  05/12/16 146 lb (66.2 kg)  Constitutional: overweight, in NAD Eyes: PERRLA,  EOMI, no exophthalmos, colored contacts ENT: moist mucous membranes, no thyromegaly, no cervical lymphadenopathy Cardiovascular: RRR, No MRG Respiratory: CTA B Gastrointestinal: abdomen soft, NT, ND, BS+ Musculoskeletal: no deformities, strength intact in all 4 Skin: moist, warm, no rashes  Assessment:     1. DM2, uncontrolled, insulin-dependent, with probable complications - ? peripheral neuropathy  Plan:     Patient with uncontrolled diabetes, noncompliant with sugar checks, visits and medications. She again returns after a long period of time, after a period off her medicines. She has good  insurance, and we had a long discussion about the need to get her diabetes under control and not run out of medicines anymore. She also needs to keep the appointments, since I cannot refill her medicines without seeing her for long periods of time. - We reviewed together her last HbA1c, which is abysmal, at 11.5% earlier this month - She does not bring a log or a meter but notices that her sugars are better. Therefore, will continue the current regimen and see her back in a month and a half with her sugar log. - I advised her to: Patient Instructions  Please continue: - Lantus 36 units every night - Metformin ER 1000 mg with b'fast and 1000 mg with dinner - Januvia 100 mg in am, before b'fast.  Check sugars 3x a day, rotating check times.  Please return in 1.5 months with your sugar log.   - continue to check sugars 2-3x a day - rotate checks - UTD with eye exams - I will see her back in 1.5 months with her sugar log  Philemon Kingdom, MD PhD [facility] Endocrinology

## 2016-07-08 NOTE — Patient Instructions (Addendum)
Please continue: - Lantus 36 units every night - Metformin ER 1000 mg with b'fast and 1000 mg with dinner - Januvia 100 mg in am, before b'fast.  Check sugars 3x a day, rotating check times.  Please return in 1.5 months with your sugar log.

## 2016-07-22 ENCOUNTER — Telehealth: Payer: Self-pay | Admitting: Neurology

## 2016-07-22 NOTE — Telephone Encounter (Signed)
Rachel [name] 01/01/1966. Her #[phone]. She stopped by to check on the Botox. She also said that what she was given has not helped her. She didn't know if there was anything else until the Botox is approved? Thank you

## 2016-07-23 NOTE — Telephone Encounter (Signed)
Left message on machine for patient to call back.

## 2016-07-23 NOTE — Telephone Encounter (Signed)
Patient did not return my call. Please follow up Monday.

## 2016-07-23 NOTE — Telephone Encounter (Signed)
If the venlafaxine hasn't helped at all, then we can titrate off of it.  Take 75mg  daily for 1 week, then 37.5mg  daily for 1 week, then stop.  Instead, we will start Depakote ER 500mg  at bedtime.  It may affect platelet count and liver enzymes, so we will need to monitor it.  The labs from last month look good.  We can repeat CBC and hepatic panel in 3 months.

## 2016-07-26 MED ORDER — DIVALPROEX SODIUM ER 500 MG PO TB24: 500.0000 mg | ORAL_TABLET | Freq: Every day | ORAL | 0 refills | Status: DC

## 2016-07-26 NOTE — Telephone Encounter (Signed)
Spoke to patient. Gave med instructions. Patient verbalized understanding.  

## 2016-08-02 ENCOUNTER — Encounter: Payer: Self-pay | Admitting: Internal Medicine

## 2016-08-03 ENCOUNTER — Other Ambulatory Visit: Payer: Self-pay

## 2016-08-03 MED ORDER — INSULIN GLARGINE 100 UNIT/ML SOLOSTAR PEN: 36.0000 [IU] | PEN_INJECTOR | Freq: Every day | SUBCUTANEOUS | 11 refills | Status: DC

## 2016-08-04 ENCOUNTER — Other Ambulatory Visit: Payer: Self-pay | Admitting: Internal Medicine

## 2016-08-04 ENCOUNTER — Other Ambulatory Visit: Payer: Self-pay

## 2016-08-04 MED ORDER — TRUEPLUS LANCETS 30G MISC: 11 refills | Status: DC

## 2016-08-04 MED ORDER — GLUCOSE BLOOD VI STRP: ORAL_STRIP | 11 refills | Status: DC

## 2016-08-11 ENCOUNTER — Telehealth: Payer: Self-pay | Admitting: Neurology

## 2016-08-11 ENCOUNTER — Telehealth: Payer: Self-pay | Admitting: *Deleted

## 2016-08-11 MED ORDER — GLUCOSE BLOOD VI STRP: ORAL_STRIP | 3 refills | Status: DC

## 2016-08-11 MED ORDER — ACCU-CHEK FASTCLIX LANCETS MISC: 3 refills | Status: DC

## 2016-08-11 MED ORDER — ACCU-CHEK GUIDE W/DEVICE KIT: 1.0000 | PACK | Freq: Two times a day (BID) | 0 refills | Status: DC

## 2016-08-11 NOTE — Telephone Encounter (Signed)
Rec'd fax stating insurance no longer covers Trueplus lancets. Pt is need rx sent for Accu-chek Fastclick. Updated chart sent supplies...Johny Chess

## 2016-08-11 NOTE — Telephone Encounter (Signed)
I verified patient's insurance and will have the PA request resubmitted.   Patient also said that the depakote is giving her a stomach ache.  Is there anything else to try?

## 2016-08-11 NOTE — Telephone Encounter (Signed)
Propranolol ER 60mg  daily.  It's a blood pressure medication, so she should monitor for lightheadedness.

## 2016-08-11 NOTE — Telephone Encounter (Signed)
Rachel [name] May 25, 2066 [phone] She would like you to please call her. She has been waiting to get approval on her Botox since around January. She is a patient of Dr. Georgie Chard.  She still has not heard anything regarding it. She also has been on a new medication and she sad it is making her sick to her stomach. Thank you

## 2016-08-12 NOTE — Telephone Encounter (Signed)
Patient said that she does not want to try the propranolol because she already has low BP.

## 2016-08-13 ENCOUNTER — Other Ambulatory Visit: Payer: Self-pay | Admitting: *Deleted

## 2016-08-13 MED ORDER — NORTRIPTYLINE HCL 10 MG PO CAPS: 10.0000 mg | ORAL_CAPSULE | Freq: Every day | ORAL | 1 refills | Status: DC

## 2016-08-13 NOTE — Telephone Encounter (Signed)
Left message informing her that we will try nortriptyline.  Rx sent to pharmacy.

## 2016-08-13 NOTE — Progress Notes (Unsigned)
nor

## 2016-08-13 NOTE — Telephone Encounter (Signed)
She can try nortriptyline 10mg  at bedtime.  It is an antidepressant used to treat headaches.  Dizziness and sleepiness are possible side effects, but that is why it is usually taken at night.

## 2016-08-20 ENCOUNTER — Ambulatory Visit (INDEPENDENT_AMBULATORY_CARE_PROVIDER_SITE_OTHER): Payer: 59 | Admitting: Neurology

## 2016-08-20 DIAGNOSIS — G43709 Chronic migraine without aura, not intractable, without status migrainosus: Secondary | ICD-10-CM

## 2016-08-20 MED ORDER — ONABOTULINUMTOXINA 100 UNITS IJ SOLR
155.0000 [IU] | Freq: Once | INTRAMUSCULAR | Status: AC
  Administered 2016-08-20: 155 [IU] via INTRAMUSCULAR

## 2016-08-20 NOTE — Progress Notes (Signed)
[facility]   Procedure Note Botox  Attending: Dr. Jaffe  Preoperative Diagnosis(es): Chronic migraine  Consent obtained from: The patient Benefits discussed included, but were not limited to decreased muscle tightness, increased joint range of motion, and decreased pain.  Risk discussed included, but were not limited pain and discomfort, bleeding, bruising, excessive weakness, venous thrombosis, muscle atrophy and dysphagia.  Anticipated outcomes of the procedure as well as he risks and benefits of the alternatives to the procedure, and the roles and tasks of the personnel to be involved, were discussed with the patient, and the patient consents to the procedure and agrees to proceed. A copy of the patient medication guide was given to the patient which explains the blackbox warning.  Patients identity and treatment sites confirmed Yes.  .  Details of Procedure: Skin was cleaned with alcohol. Prior to injection, the needle plunger was aspirated to make sure the needle was not within a blood vessel.  There was no blood retrieved on aspiration.    Following is a summary of the muscles injected  And the amount of Botulinum toxin used:  Dilution 200 units of Botox was reconstituted with 4 ml of preservative free normal saline. Time of reconstitution: At the time of the office visit (<30 minutes prior to injection)   Injections  155 total units of Botox was injected with a 30 gauge needle.  Injection Sites: L occipitalis: 15 units- 3 sites  R occiptalis: 15 units- 3 sites  L upper trapezius: 15 units- 3 sites R upper trapezius: 15 units- 3 sits          L paraspinal: 10 units- 2 sites R paraspinal: 10 units- 2 sites  Face L frontalis(2 injection sites):10 units   R frontalis(2 injection sites):10 units         L corrugator: 5 units   R corrugator: 5 units           Procerus: 5 units   L temporalis: 20 units R temporalis: 20 units   Agent:  200 units of botulinum Type A  (Onobotulinum Toxin type A) was reconstituted with 4 ml of preservative free normal saline.  Time of reconstitution: At the time of the office visit (<30 minutes prior to injection)     Total injected (Units): 155  Total wasted (Units): none wasted  Patient tolerated procedure well without complications.   Reinjection is anticipated in 3 months.  Adam R. Jaffe, DO   

## 2016-09-01 ENCOUNTER — Encounter: Payer: Self-pay | Admitting: Internal Medicine

## 2016-09-01 ENCOUNTER — Telehealth: Payer: Self-pay | Admitting: *Deleted

## 2016-09-01 ENCOUNTER — Ambulatory Visit (INDEPENDENT_AMBULATORY_CARE_PROVIDER_SITE_OTHER): Payer: 59 | Admitting: Internal Medicine

## 2016-09-01 VITALS — BP 112/80 | HR 86 | Wt 150.0 lb

## 2016-09-01 DIAGNOSIS — E1165 Type 2 diabetes mellitus with hyperglycemia: Secondary | ICD-10-CM | POA: Diagnosis not present

## 2016-09-01 DIAGNOSIS — Z794 Long term (current) use of insulin: Secondary | ICD-10-CM

## 2016-09-01 MED ORDER — BUTALBITAL-APAP-CAFFEINE 50-325-40 MG PO TABS: ORAL_TABLET | ORAL | 1 refills | Status: DC

## 2016-09-01 MED ORDER — INSULIN PEN NEEDLE 32G X 4 MM MISC
99 refills | Status: DC
Start: 2016-09-01 — End: 2017-12-20

## 2016-09-01 MED ORDER — EMPAGLIFLOZIN 25 MG PO TABS: 25.0000 mg | ORAL_TABLET | Freq: Every day | ORAL | 4 refills | Status: DC

## 2016-09-01 NOTE — Telephone Encounter (Signed)
Done hardcopy to [name]  

## 2016-09-01 NOTE — Patient Instructions (Addendum)
Please continue: - Lantus 36 units every night - Metformin ER 1000 mg with b'fast and 1000 mg with dinner - Januvia 100 mg in am, before b'fast.  Please start Jardiance 25 mg in am, before b'fast.  Check sugars 2x a day, rotating check times.  Please return in 1.5 months with your sugar log.

## 2016-09-01 NOTE — Telephone Encounter (Signed)
Rec'd fax pt requesting refill on Butalbital/APAP/Caffeine tabs # 30. Last filled 07/20/16...Rachel [name]

## 2016-09-01 NOTE — Progress Notes (Signed)
Subjective:     Patient ID: Rachel [name], female   DOB: [birth year], 51 y.o.   MRN: [id number]  HPI Rachel [name] is a pleasant 51 y.o. woman, returning for f/u for DM2, dx 1987, uncontrolled, insulin-dependent, with probable complications (? peripheral neuropathy). Last visit 2 months ago. She was not compliant with appts. In the past.  Last HbA1C: Lab Results  Component Value Date   HGBA1C 11.5 (H) 06/25/2016   HGBA1C 10.9 04/29/2015   HGBA1C 10.8 (H) 01/09/2015  Prev. 10.1%.  She came off all DM medicines for 6 mo before the HbA1c in 09/2013! She had increased thirst and urination and lost weight during this period: from a size 12 to 8! She restarted her meds in 06/2014. She again ran out of all meds in 04/2016. She was w/o meds for 3 weeks. She restarted 2 weeks Before last visit.   She is on: - Lantus 36 units at bedtime - Januvia 100 mg in am - Metformin XR 1000 mg 2x a day with b'fast and dinner She was on Glipizide 5 mg bid - added 04/2015 >> was not taking it b/c low CBGs. We stopped Glipizide XL 5 mg in 07/2014. She had episodes of hypoglycemia with Amaryl in the past. She was on Bydureon 2 mg weekly (had nausea).  She checks her sugars ~1-2 a day. She brings her log: - am:  50-125 >> 179-259 >> 49x1, 122-130s, 166  >> 126 >> 72-178, most 106-145 - after coffee >> 170 >> after b'fast: 158-251 - before lunch: 80-90s >> 300s (only checks when thirsty) >> n/c >> 137 - before dinner: 276 >> 191, 308 - after dinner: 166-245 - bedtime: 100-130 >> n/c >> 300-500 >> 123-150s, 190 (rice) >> n/c >> 130, 145-245, 282 She has hypoglycemia awareness in the 90s now. She can have lows at night >> 50 >> 81.  Highest 600 >> 190 >> HI but 300s recently >> 308.  - She has seen nutrition   - last lipids: Lab Results  Component Value Date   CHOL 215 (H) 06/25/2016   HDL 58.50 06/25/2016   LDLCALC 198 (H) 01/09/2015   LDLDIRECT 122.0 06/25/2016   TRIG 296.0 (H) 06/25/2016   CHOLHDL 4  06/25/2016  On Simvastatin. - No CKD: Lab Results  Component Value Date   BUN 10 06/25/2016   Lab Results  Component Value Date   CREATININE 0.68 06/25/2016  - Has neuropathy R leg.  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  Review of Systems Constitutional: no weight loss/gain, no fatigue, + heat intolerance, no excessive urination Eyes: no blurry vision, no xerophthalmia ENT: no sore throat, no nodules palpated in throat, no dysphagia/no odynophagia, no hoarseness Cardiovascular: no CP/no SOB/palpitations/leg swelling Respiratory: no cough/SOB/wheezing Gastrointestinal: no N/+ V/no D/C/heartburn Musculoskeletal: no muscle/joint aches Skin: no rashes Neurological: no tremors/numbness/tingling/dizziness, + HA  I reviewed pt's medications, allergies, PMH, social hx, family hx, and changes were documented in the history of present illness. Otherwise, unchanged from my initial visit note.  Objective:   Physical Exam BP 112/80 (BP Location: Left Arm, Patient Position: Sitting)   Pulse 86   Wt 150 lb (68 kg)   SpO2 98%   BMI 29.29 kg/m  Body mass index is 29.29 kg/m. Wt Readings from Last 3 Encounters:  09/01/16 150 lb (68 kg)  07/08/16 148 lb (67.1 kg)  06/25/16 146 lb (66.2 kg)  Constitutional: overweight, in  NAD Eyes: PERRLA, EOMI, no exophthalmos, colored contacts ENT: moist mucous membranes, no thyromegaly, no cervical lymphadenopathy Cardiovascular: RRR, No MRG Respiratory: CTA B Gastrointestinal: abdomen soft, NT, ND, BS+ Musculoskeletal: no deformities, strength intact in all 4 Skin: moist, warm, no rashes  Assessment:     1. DM2, uncontrolled, insulin-dependent, with probable complications - ? peripheral neuropathy  Plan:     Patient with uncontrolled diabetes, prev. noncompliant with sugar checks, visits and medications. She did a better job with these since last visit, taking her medicines as advised and  bringing a good sugar log. Her sugars are fluctuating, with frequent spikes in blood sugars especially after meals. I suggested to add Jardiance to help with this spikes. We cannot use a GLP-1 receptor agonist, since she has nausea and occasional vomiting more recently and we cannot use glipizide since this caused her hypoglycemia in the past. - We reviewed together her last HbA1c, which was high, at 11.5% at last visit. Will repeat at next visit. - I advised her to: Patient Instructions  Please continue: - Lantus 36 units every night - Metformin ER 1000 mg with b'fast and 1000 mg with dinner - Januvia 100 mg in am, before b'fast.  Please start Jardiance 25 mg in am, before b'fast.  Check sugars 2x a day, rotating check times.  Please return in 1.5 months with your sugar log.   - continue to check sugars 2-3x a day - rotate checks - UTD with eye exams - I will see her back in 1.5 months with her sugar log. We'll need to check a BMP and her HbA1c at next visit  Philemon Kingdom, MD PhD [facility] Endocrinology

## 2016-09-02 NOTE — Telephone Encounter (Signed)
[name] faxed back to pharmacy yesterday...Johny Chess

## 2016-09-09 ENCOUNTER — Other Ambulatory Visit: Payer: Self-pay | Admitting: Nurse Practitioner

## 2016-09-09 ENCOUNTER — Other Ambulatory Visit (HOSPITAL_COMMUNITY)
Admission: RE | Admit: 2016-09-09 | Discharge: 2016-09-09 | Disposition: A | Discharge status: Home / Self Care | Payer: 59 | Source: Ambulatory Visit | Attending: Nurse Practitioner | Admitting: Nurse Practitioner

## 2016-09-09 DIAGNOSIS — Z1151 Encounter for screening for human papillomavirus (HPV): Secondary | ICD-10-CM | POA: Insufficient documentation

## 2016-09-09 DIAGNOSIS — Z124 Encounter for screening for malignant neoplasm of cervix: Secondary | ICD-10-CM | POA: Insufficient documentation

## 2016-09-09 DIAGNOSIS — Z01419 Encounter for gynecological examination (general) (routine) without abnormal findings: Secondary | ICD-10-CM | POA: Diagnosis not present

## 2016-09-09 DIAGNOSIS — N952 Postmenopausal atrophic vaginitis: Secondary | ICD-10-CM | POA: Diagnosis not present

## 2016-09-10 LAB — CYTOLOGY - PAP
Diagnosis: NEGATIVE
HPV: NOT DETECTED

## 2016-09-13 ENCOUNTER — Telehealth: Payer: Self-pay | Admitting: Internal Medicine

## 2016-09-13 NOTE — Telephone Encounter (Signed)
Notified patient.  Please follow up with patient once sent back to [employer].

## 2016-09-13 NOTE — Telephone Encounter (Signed)
Please be on look out for FMLA forms faxed from employer.

## 2016-09-13 NOTE — Telephone Encounter (Signed)
I just got this off the fax. I will bring it to you.

## 2016-09-13 NOTE — Telephone Encounter (Signed)
Forms have been filled out and are waiting for signature from PCP.

## 2016-09-13 NOTE — Telephone Encounter (Signed)
Will do!

## 2016-09-15 DIAGNOSIS — Z0289 Encounter for other administrative examinations: Secondary | ICD-10-CM

## 2016-09-15 NOTE — Telephone Encounter (Signed)
It has been faxed to [employer].

## 2016-09-15 NOTE — Telephone Encounter (Signed)
Notified patient.

## 2016-10-11 DIAGNOSIS — Z13820 Encounter for screening for osteoporosis: Secondary | ICD-10-CM | POA: Diagnosis not present

## 2016-10-11 DIAGNOSIS — Z780 Asymptomatic menopausal state: Secondary | ICD-10-CM | POA: Diagnosis not present

## 2016-10-20 ENCOUNTER — Other Ambulatory Visit: Payer: Self-pay | Admitting: Internal Medicine

## 2016-11-09 ENCOUNTER — Telehealth: Payer: 59 | Admitting: Family

## 2016-11-09 DIAGNOSIS — A499 Bacterial infection, unspecified: Secondary | ICD-10-CM

## 2016-11-09 DIAGNOSIS — N390 Urinary tract infection, site not specified: Secondary | ICD-10-CM | POA: Diagnosis not present

## 2016-11-09 MED ORDER — NITROFURANTOIN MONOHYD MACRO 100 MG PO CAPS: 100.0000 mg | ORAL_CAPSULE | Freq: Two times a day (BID) | ORAL | 0 refills | Status: DC

## 2016-11-09 NOTE — Progress Notes (Signed)

## 2016-11-11 ENCOUNTER — Telehealth: Payer: Self-pay | Admitting: Endocrinology

## 2016-11-11 ENCOUNTER — Ambulatory Visit (INDEPENDENT_AMBULATORY_CARE_PROVIDER_SITE_OTHER): Payer: 59 | Admitting: Internal Medicine

## 2016-11-11 ENCOUNTER — Encounter: Payer: Self-pay | Admitting: Internal Medicine

## 2016-11-11 ENCOUNTER — Telehealth: Payer: Self-pay | Admitting: Internal Medicine

## 2016-11-11 ENCOUNTER — Telehealth: Payer: Self-pay

## 2016-11-11 VITALS — BP 120/74 | HR 92 | Ht 61.5 in | Wt 147.0 lb

## 2016-11-11 DIAGNOSIS — E119 Type 2 diabetes mellitus without complications: Secondary | ICD-10-CM

## 2016-11-11 LAB — POCT GLYCOSYLATED HEMOGLOBIN (HGB A1C): Hemoglobin A1C: 9.2

## 2016-11-11 MED ORDER — DULAGLUTIDE 0.75 MG/0.5ML ~~LOC~~ SOAJ: SUBCUTANEOUS | 1 refills | Status: DC

## 2016-11-11 MED ORDER — DULAGLUTIDE 1.5 MG/0.5ML ~~LOC~~ SOAJ: SUBCUTANEOUS | 11 refills | Status: DC

## 2016-11-11 NOTE — Telephone Encounter (Signed)
Made in error

## 2016-11-11 NOTE — Telephone Encounter (Signed)
Patient is calling b/c she needs verification of medication changes/reasons so that  She can get her scripts.  She said her insurance is telling her to try other meds, but she has tried them already.  She needs documentation of that. Please call to discuss.  Thank you,  -LL

## 2016-11-11 NOTE — Patient Instructions (Addendum)
Please continue: - Lantus 36 units every night - Metformin ER 1000 mg with b'fast and 1000 mg with dinner - Januvia 100 mg in am, before b'fast.  Please start Trulicity 9.76 mg weekly. After 1 month, try the higher dose (1.5 mg).  Check sugars 2x a day, rotating check times.  Please return in 3 months with your sugar log.

## 2016-11-11 NOTE — Telephone Encounter (Signed)
Called patient and advised if the pharmacy and insurance were needing a PA request, that they normally submit a fax to [place] so we can decide how to proceed further. Advised patient to call back if any questions. Marland Kitchen

## 2016-11-11 NOTE — Progress Notes (Signed)
Subjective:     Patient ID: Rachel [name], female   DOB: 05-Jul-1965, 51 y.o.   MRN: [id number]  HPI [name] is a pleasant 51 y.o. woman, returning for f/u for DM2, dx 1987, uncontrolled, insulin-dependent, with probable complications (? peripheral neuropathy). Last visit 2 mo ago.  Last HbA1C: Lab Results  Component Value Date   HGBA1C 11.5 (H) 06/25/2016   HGBA1C 10.9 04/29/2015   HGBA1C 10.8 (H) 01/09/2015  Prev. 10.1%.  Reviewed hx: She came off all DM medicines for 6 mo before the HbA1c in 09/2013. She again ran out of all meds in 04/2016.  She is now  on: - Lantus 36 units at bedtime - Januvia 100 mg in am - Metformin XR 1000 mg 2x a day with b'fast and dinner We started Jardiance 25 mg daily >> started 08/2016 >> but stopped recently 2/2 recurrent UTIs She was on Glipizide 5 mg bid - added 04/2015 >> was not taking it b/c low CBGs. We stopped Glipizide XL 5 mg in 07/2014. She had episodes of hypoglycemia with Amaryl in the past. She was on Bydureon 2 mg weekly (had nausea).  She checks her sugars 1-2x a day >> better except in last few days after stopping the Jardiance: - am:  49x1, 122-130s, 166  >> 126 >> 72-178, most 106-145 >> 102-110, 260 this am - after coffee >> 170 >> after b'fast: 158-251 >> n/c - before lunch: 80-90s >> 300s (only checks when thirsty) >> n/c >> 137 >> n/c - before dinner: 276 >> 191, 308 >> n/c - after dinner: 166-245 >> n/c - bedtime: 300-500 >> 123-150s, 190 (rice) >> n/c >> 130, 145-245, 282 >> 140-150 She has hypoglycemia awareness in the 90s.  Highest 600 >> 190 >> HI but 300s recently >> 308 >> 260s.  - She has seen nutrition   - + HL. Last lipids: Lab Results  Component Value Date   CHOL 215 (H) 06/25/2016   HDL 58.50 06/25/2016   LDLCALC 198 (H) 01/09/2015   LDLDIRECT 122.0 06/25/2016   TRIG 296.0 (H) 06/25/2016   CHOLHDL 4 06/25/2016  On Simvastatin. 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  Review of Systems Constitutional: + weight gain, no fatigue, + hot flushes, + burning with urination Eyes: no blurry vision, no xerophthalmia ENT: no sore throat, no nodules palpated in throat, no dysphagia, no odynophagia, no hoarseness Cardiovascular: no CP/no SOB/no palpitations/no leg swelling Respiratory: no cough/no SOB/no wheezing Gastrointestinal: no N/no V/+ D/no C/+ acid reflux Musculoskeletal: no muscle aches/no joint aches Skin: no rashes, no hair loss Neurological: no tremors/no numbness/no tingling/no dizziness  I reviewed pt's medications, allergies, PMH, social hx, family hx, and changes were documented in the history of present illness. Otherwise, unchanged from my initial visit note.  Objective:   Physical Exam BP 120/74 (BP Location: Left Arm, Patient Position: Sitting)   Pulse 92   Ht 5' 1.5" (1.562 m)   Wt 147 lb (66.7 kg)   SpO2 98%   BMI 27.33 kg/m  Body mass index is 27.33 kg/m. Wt Readings from Last 3 Encounters:  11/11/16 147 lb (66.7 kg)  09/01/16 150 lb (68 kg)  07/08/16 148 lb (67.1 kg)  Constitutional:  overweight, in NAD Eyes: PERRLA, EOMI, no exophthalmos ENT: moist mucous membranes, no thyromegaly, no cervical lymphadenopathy Cardiovascular: RRR, No MRG Respiratory: CTA B Gastrointestinal: abdomen soft, NT, ND, BS+ Musculoskeletal: no deformities, strength intact in all 4 Skin: moist, warm, no rashes Neurological: no tremor with outstretched hands, DTR normal in all 4  Assessment:     1. DM2, uncontrolled, insulin-dependent, with probable complications - ? peripheral neuropathy  Plan:     Patient with uncontrolled diabetes, Previously noncompliant with sugar checks, visits, and medications. She is now doing better, coming for her appointments consistently. At last  visit, we added Jardiance to her medication regimen, as her HbA1c was very high, at 11.5%, and her sugars were also very high. She did well with the medication, however, she developed recurrent UTIs, and had to stop few days ago. - She inquires about a GLP-1 receptor agonist, and we can try this, since her nausea is better. We will start with a low dose and increase as tolerated. She does not have a history of pancreatitis or family history of medullary thyroid cancer.  - We reviewed together her last HbA1c, which was high, at 11.5% at last visit. Will repeat at next visit. - I advised her to: Patient Instructions  Please continue: - Lantus 36 units every night - Metformin ER 1000 mg with b'fast and 1000 mg with dinner - Januvia 100 mg in am, before b'fast.  Please start Trulicity 4.33 mg weekly. After 1 month, try the higher dose (1.5 mg).  Check sugars 2x a day, rotating check times.  Please return in 3 months with your sugar log.   - today, HbA1c is 9.2% (improved!) - continue checking sugars at different times of the day - check 1-2x a day, rotating checks - advised for yearly eye exams >> she is UTD - Return to clinic in 3 mo with sugar log   Philemon Kingdom, MD PhD [facility]

## 2016-11-12 ENCOUNTER — Other Ambulatory Visit: Payer: Self-pay | Admitting: Internal Medicine

## 2016-11-12 NOTE — Telephone Encounter (Signed)
Done hardcopy to [name]  

## 2016-11-12 NOTE — Telephone Encounter (Signed)
Faxed

## 2016-11-16 NOTE — Telephone Encounter (Signed)
Received paperwork from Pharmacy will work on [place] .

## 2016-11-16 NOTE — Telephone Encounter (Signed)
Patient is asking for Dulaglutide (TRULICITY) 5.79 UX/8.3FX SOPN however, the insurance is asking for prior authorization before approving the medication . Please advise.   [place], [place] - [address]. [phone] (Phone) [phone] (Fax)    Please contact patient once this has been done.

## 2016-11-17 ENCOUNTER — Telehealth: Payer: Self-pay

## 2016-11-17 NOTE — Telephone Encounter (Signed)
Called to notify patient that PA for Trulicity was submitted today to insurance. Patient had understanding, and no questions.

## 2016-11-18 ENCOUNTER — Other Ambulatory Visit: Payer: Self-pay

## 2016-11-18 DIAGNOSIS — M8588 Other specified disorders of bone density and structure, other site: Secondary | ICD-10-CM | POA: Diagnosis not present

## 2016-11-18 MED ORDER — DULAGLUTIDE 0.75 MG/0.5ML ~~LOC~~ SOAJ: SUBCUTANEOUS | 1 refills | Status: DC

## 2016-11-25 DIAGNOSIS — E559 Vitamin D deficiency, unspecified: Secondary | ICD-10-CM | POA: Diagnosis not present

## 2016-11-25 DIAGNOSIS — N760 Acute vaginitis: Secondary | ICD-10-CM | POA: Diagnosis not present

## 2016-11-25 DIAGNOSIS — M8580 Other specified disorders of bone density and structure, unspecified site: Secondary | ICD-10-CM | POA: Diagnosis not present

## 2016-11-25 DIAGNOSIS — R8290 Unspecified abnormal findings in urine: Secondary | ICD-10-CM | POA: Diagnosis not present

## 2016-11-26 ENCOUNTER — Ambulatory Visit (INDEPENDENT_AMBULATORY_CARE_PROVIDER_SITE_OTHER): Payer: 59 | Admitting: Neurology

## 2016-11-26 DIAGNOSIS — G43709 Chronic migraine without aura, not intractable, without status migrainosus: Secondary | ICD-10-CM

## 2016-11-26 MED ORDER — ONABOTULINUMTOXINA 100 UNITS IJ SOLR
155.0000 [IU] | Freq: Once | INTRAMUSCULAR | Status: AC
  Administered 2016-11-26: 155 [IU] via INTRAMUSCULAR

## 2016-11-26 NOTE — Progress Notes (Signed)
[facility]   Procedure Note Botox  Attending: Dr. Tomi Likens  Preoperative Diagnosis(es): Chronic migraine  Consent obtained from: The patient Benefits discussed included, but were not limited to decreased muscle tightness, increased joint range of motion, and decreased pain.  Risk discussed included, but were not limited pain and discomfort, bleeding, bruising, excessive weakness, venous thrombosis, muscle atrophy and dysphagia.  Anticipated outcomes of the procedure as well as he risks and benefits of the alternatives to the procedure, and the roles and tasks of the personnel to be involved, were discussed with the patient, and the patient consents to the procedure and agrees to proceed. A copy of the patient medication guide was given to the patient which explains the blackbox warning.  Patients identity and treatment sites confirmed Yes.  .  Details of Procedure: Skin was cleaned with alcohol. Prior to injection, the needle plunger was aspirated to make sure the needle was not within a blood vessel.  There was no blood retrieved on aspiration.    Following is a summary of the muscles injected  And the amount of Botulinum toxin used:  Dilution 200 units of Botox was reconstituted with 4 ml of preservative free normal saline. Time of reconstitution: At the time of the office visit (<30 minutes prior to injection)   Injections  155 total units of Botox was injected with a 30 gauge needle.  Injection Sites: L occipitalis: 15 units- 3 sites  R occiptalis: 15 units- 3 sites  L upper trapezius: 15 units- 3 sites R upper trapezius: 15 units- 3 sits          L paraspinal: 10 units- 2 sites R paraspinal: 10 units- 2 sites  Face L frontalis(2 injection sites):10 units   R frontalis(2 injection sites):10 units         L corrugator: 5 units   R corrugator: 5 units           Procerus: 5 units   L temporalis: 20 units R temporalis: 20 units   Agent:  200 units of botulinum Type A  (Onobotulinum Toxin type A) was reconstituted with 4 ml of preservative free normal saline.  Time of reconstitution: At the time of the office visit (<30 minutes prior to injection)     Total injected (Units): 155  Total wasted (Units): 0  Patient tolerated procedure well without complications.   Reinjection is anticipated in 3 months. Return to clinic next month.  Metta Clines, DO

## 2016-11-30 ENCOUNTER — Telehealth: Payer: 59 | Admitting: Nurse Practitioner

## 2016-11-30 DIAGNOSIS — J0100 Acute maxillary sinusitis, unspecified: Secondary | ICD-10-CM

## 2016-11-30 MED ORDER — AMOXICILLIN-POT CLAVULANATE 875-125 MG PO TABS: 1.0000 | ORAL_TABLET | Freq: Two times a day (BID) | ORAL | 0 refills | Status: DC

## 2016-11-30 NOTE — Progress Notes (Signed)

## 2016-12-28 ENCOUNTER — Ambulatory Visit (INDEPENDENT_AMBULATORY_CARE_PROVIDER_SITE_OTHER): Payer: 59 | Admitting: Neurology

## 2016-12-28 ENCOUNTER — Encounter: Payer: Self-pay | Admitting: Neurology

## 2016-12-28 ENCOUNTER — Other Ambulatory Visit: Payer: Self-pay | Admitting: Internal Medicine

## 2016-12-28 VITALS — BP 100/78 | HR 102 | Ht 61.5 in | Wt 148.6 lb

## 2016-12-28 DIAGNOSIS — G43009 Migraine without aura, not intractable, without status migrainosus: Secondary | ICD-10-CM | POA: Diagnosis not present

## 2016-12-28 DIAGNOSIS — M542 Cervicalgia: Secondary | ICD-10-CM

## 2016-12-28 MED ORDER — ZOLMITRIPTAN 5 MG NA SOLN: 1.0000 | NASAL | 0 refills | Status: DC | PRN

## 2016-12-28 NOTE — Progress Notes (Signed)
NEUROLOGY FOLLOW UP OFFICE NOTE  Rachel [name] [id number]  HISTORY OF PRESENT ILLNESS: Rachel [name] is a 51 year old right-handed female with type 2 diabetes, hyperlipidemia, GERD, asthma, intermittent vertigo, and migraine who follows up for migraine.   UPDATE: She still reports neck pain but never went to PT. Intensity:  6/10 (improved from 8/10) Duration:  All day (does not take anything because "nothing helps") Frequency:  8-10 headache days per month (over 50% reduction in frequency since prior to Botox) Current NSAIDS:  Mobic (for back pain, ineffective for headache) Current analgesics:  Tramadol (for back pain, ineffective for headache) Current triptans: no Current anti-emetic:  Zofran ODT 68m Current muscle relaxants: Robaxin   Current anti-anxiolytic:  no Current sleep aide:  no Current Antihypertensive medications:  no Current Antidepressant medications:  No.  She was prescribed nortriptyline but never started it due to potential side effects of an antidepressant. Current Anticonvulsant medications:  gabapentin 2032mCurrent Vitamins/Herbal/Supplements:  turmeric Current Antihistamines/Decongestants:  no Other therapy:  Botox (Status post 3 rounds)   HISTORY: Migraines: Onset:  2011.  She does have remote history of headaches when she was younger. Location:  Back of head and radiates to the front Quality:  Shooting up from back of head, pounding on top and front of head Intensity:  8/10 Aura:  no Prodrome:  no Associated symptoms:  Nausea, photophobia, phonophobia, blurred vision (she gets annual eye exams due to diabetes) Duration:  All day Frequency:  Almost daily (cannot function 6 days per month) Triggers/exacerbating factors:  unknown Relieving factors:  No Activity:  Cannot work about 6 days per month (she works as an on[employer]vertising account planner  Past abortive medication:  Sumatriptan 10020mineffective), Maxalt (dizziness, ineffective), Cambia (made headache  worse), Excedrin, naproxen, Tylenol, Fioricet, Cambia Past preventative medication:  topiramate, Depakote (stomach ache), venlafaxine XR 150m72mabapentin Other past therapy:  none   Frequent Falls: For the past few years, she has had falls, but they have become more frequent over the past year.  Previously, she was diagnosed with vertigo.  However, she reports that she doesn't note spinning or lightheadedness.  She says she "just falls".  On one occasion, she was pushing the seat forward while sitting and just fell over.  Otherwise, it always occurs while walking or on her feet.  She denies double vision or focal numbness or weakness.  MRI of the brain with seizure protocol was performed on 08/02/11, which was unremarkable.  Sleep deprived EEG was normal.  She has had PT, which was ineffective.  She does report that she sometimes trips while walking but doesn't fall.   Cervical plain films from 05/07/15 showed mild degenerative disc disease at C5-6, but otherwise unremarkable.  Lumbar films from 06/10/15 were personally reviewed and were negative.  She underwent anothner MRI of brain with and without contrast on 02/10/16, and was stable compared to prior MRI from 2013.  NCV-EMG from 05/13/16 was negative for neuropathy or lumbosacral radiculopathy.  PAST MEDICAL HISTORY: Past Medical History:  Diagnosis Date  . ALLERGIC RHINITIS 10/04/2007  . Anemia 01/21/2011  . ASTHMA 08/02/2007   INHALERS ONLY IN SPRISan FernandoBlood transfusion without reported diagnosis    with first child   . COMMON MIGRAINE 05/15/2009  . DIABETES MELLITUS, TYPE II 08/02/2007  . GERD 08/02/2007  . HYPERLIPIDEMIA 08/02/2007  . INSOMNIA-SLEEP DISORDER-UNSPEC 01/04/2008  . INTERMITTENT VERTIGO 05/15/2009  . LIBIDO, DECREASED 01/09/2010    MEDICATIONS: Current Outpatient Prescriptions on File Prior  to Visit  Medication Sig Dispense Refill  . ACCU-CHEK FASTCLIX LANCETS MISC Use to check blood sugars twice times a day. Dx E11.9 102 each 3   . aspirin 81 MG EC tablet Take 81 mg by mouth daily.      . Blood Glucose Monitoring Suppl (ACCU-CHEK GUIDE) w/Device KIT 1 Device by Does not apply route 2 (two) times daily. Use to check blood sugars twice a day Dx. E11.9 1 kit 0  . DENTA 5000 PLUS 1.1 % CREA dental cream   3  . divalproex (DEPAKOTE ER) 500 MG 24 hr tablet Take 1 tablet (500 mg total) by mouth at bedtime. 30 tablet 0  . Dulaglutide (TRULICITY) 7.67 HA/1.9FX SOPN Inject 0.75 mg under skin weekly in am 4 pen 1  . Dulaglutide (TRULICITY) 1.5 TK/2.4OX SOPN Inject 1.5 mg under skin weekly in am 4 pen 11  . eletriptan (RELPAX) 40 MG tablet Take 1 tablet (40 mg total) by mouth as needed for migraine or headache. May repeat in 2 hours if headache persists or recurs. 10 tablet 2  . esomeprazole (NEXIUM) 40 MG capsule Take 1 capsule (40 mg total) by mouth 2 (two) times daily before a meal. 180 capsule 3  . glucose blood (ACCU-CHEK GUIDE) test strip Use to check blood sugars twice a day Dx E11.9 100 each 3  . Insulin Glargine (LANTUS SOLOSTAR) 100 UNIT/ML Solostar Pen Inject 36 Units into the skin daily at 10 pm. 20 pen 11  . Insulin Pen Needle (UNIFINE PENTIPS) 32G X 4 MM MISC USE 1x a day 100 each PRN  . meloxicam (MOBIC) 15 MG tablet Take 1 tablet (15 mg total) by mouth daily. 30 tablet 0  . metFORMIN (GLUCOPHAGE-XR) 500 MG 24 hr tablet TAKE 2 TABLETS BY MOUTH 2 TIMES DAILY WITH A MEAL. 120 tablet 5  . methocarbamol (ROBAXIN) 500 MG tablet Take 1 tablet (500 mg total) by mouth 2 (two) times daily as needed for muscle spasms. 30 tablet 2  . nitrofurantoin, macrocrystal-monohydrate, (MACROBID) 100 MG capsule Take 1 capsule (100 mg total) by mouth 2 (two) times daily. 10 capsule 0  . nortriptyline (PAMELOR) 10 MG capsule Take 1 capsule (10 mg total) by mouth at bedtime. 30 capsule 1  . ondansetron (ZOFRAN) 4 MG tablet Take 1 tablet (4 mg total) by mouth every 8 (eight) hours as needed for nausea or vomiting. 20 tablet 0  . rosuvastatin  (CRESTOR) 40 MG tablet Take 1 tablet (40 mg total) by mouth daily. 90 tablet 3  . sitaGLIPtin (JANUVIA) 100 MG tablet Take 1 tablet (100 mg total) by mouth daily. 90 tablet 1  . traMADol (ULTRAM) 50 MG tablet TAKE 1 TABLET BY MOUTH EVERY 8 HOURS AS NEEDED 60 tablet 2  . butalbital-acetaminophen-caffeine (FIORICET, ESGIC) 50-325-40 MG tablet TAKE 1 TABLET BY MOUTH EVERY 6 HOURS AS NEEDED FOR HEADACHE **MAX 1 OR 2 TABLETS PER DAY (Patient not taking: Reported on 12/28/2016) 30 tablet 1  . rizatriptan (MAXALT-MLT) 10 MG disintegrating tablet Take 1tablet at earliest onset of headache.  May repeat once in 2 hours if needed.  Do note exceed 2 tablets in 24 hrs. (Patient not taking: Reported on 12/28/2016) 9 tablet 11  . SUMAtriptan Succinate (ZEMBRACE SYMTOUCH) 3 MG/0.5ML SOAJ Inject 1 Device into the skin once. 1 pen 0  . SUMAtriptan Succinate (ZEMBRACE SYMTOUCH) 3 MG/0.5ML SOAJ Inject 1 Device into the skin once. May repeat dose once after 1 hour if headache persists or returns (do not exceed  two injections in 24 hours) 9 pen 0   No current facility-administered medications on file prior to visit.     ALLERGIES: Allergies  Allergen Reactions  . Lipitor [Atorvastatin]     REACTION: myalgias  . Nitroglycerin     FAMILY HISTORY: Family History  Problem Relation Age of Onset  . Hypertension Father   . Diabetes Father   . Asthma Father   . Cervical cancer Maternal Aunt   . Heart disease Maternal Aunt   . Anesthesia problems Neg Hx   . Colon cancer Neg Hx     SOCIAL HISTORY: Social History   Social History  . Marital status: Married    Spouse name: [name]  . Number of children: 3  . Years of education: Degree   Occupational History  . [employer]  . [employer]   Social History Main Topics  . Smoking status: Never Smoker  . Smokeless tobacco: Never Used  . Alcohol use No  . Drug use: No  . Sexual activity: Yes   Other Topics Concern  . Not on  file   Social History Narrative   She has 3 daughters.   Patient has a 4 year degree.    Patient working at [employer].    Patient is married to [place].    REVIEW OF SYSTEMS: Constitutional: No fevers, chills, or sweats, no generalized fatigue, change in appetite Eyes: No visual changes, double vision, eye pain Ear, nose and throat: No hearing loss, ear pain, nasal congestion, sore throat Cardiovascular: No chest pain, palpitations Respiratory:  No shortness of breath at rest or with exertion, wheezes GastrointestinaI: No nausea, vomiting, diarrhea, abdominal pain, fecal incontinence Genitourinary:  No dysuria, urinary retention or frequency Musculoskeletal:  No neck pain, back pain Integumentary: No rash, pruritus, skin lesions Neurological: as above Psychiatric: No depression, insomnia, anxiety Endocrine: No palpitations, fatigue, diaphoresis, mood swings, change in appetite, change in weight, increased thirst Hematologic/Lymphatic:  No purpura, petechiae. Allergic/Immunologic: no itchy/runny eyes, nasal congestion, recent allergic reactions, rashes  PHYSICAL EXAM: Vitals:   12/28/16 0831  BP: 100/78  Pulse: (!) 102   General: No acute distress.  Patient appears well-groomed.  normal body habitus. Head:  Normocephalic/atraumatic Eyes:  Fundi examined but not visualized Neck: supple, no paraspinal tenderness, full range of motion Heart:  Regular rate and rhythm Lungs:  Clear to auscultation bilaterally Back: No paraspinal tenderness Neurological Exam: alert and oriented to person, place, and time. Attention span and concentration intact, recent and remote memory intact, fund of knowledge intact.  Speech fluent and not dysarthric, language intact.  CN II-XII intact. Bulk and tone normal, muscle strength 5/5 throughout.  Sensation to light touch, temperature and vibration intact.  Deep tendon reflexes 2+ throughout, toes downgoing.  Finger to nose and heel to shin testing intact.   Gait normal, Romberg negative.  IMPRESSION: Migraine without aura, improved with over 50% reduction in headache frequency and reduced headache intensity since starting Botox Neck pain, also likely contributing to headache.  PLAN: 1.  I asked her to contact Dr. Thompson Caul office (whom she previously saw for back pain) to schedule appointment to treat the neck pain. 2.  She will try Zomig 73m NS for abortive therapy 3.  Follow up for Botox in September.    AMetta Clines DO  CC:  JCathlean Cower MD

## 2016-12-28 NOTE — Telephone Encounter (Signed)
Done hardcopy to [name]  

## 2016-12-28 NOTE — Telephone Encounter (Signed)
Faxed

## 2016-12-28 NOTE — Patient Instructions (Signed)
1.  Contact Dr. Thompson Caul office to schedule appointment to treat the neck pain. 2.  I will provide you samples of Zomig 5mg  nasal spray.  Spray once in nostril at earliest onset of migraine.  You may repeat once after two hours if needed.  If this is effective, contact me and I will send in a prescription and give you a copay card 3.  Follow up for Botox in September.

## 2017-02-17 ENCOUNTER — Telehealth: Payer: Self-pay | Admitting: Neurology

## 2017-02-17 ENCOUNTER — Ambulatory Visit (INDEPENDENT_AMBULATORY_CARE_PROVIDER_SITE_OTHER): Payer: 59 | Admitting: Internal Medicine

## 2017-02-17 ENCOUNTER — Encounter: Payer: Self-pay | Admitting: Internal Medicine

## 2017-02-17 VITALS — BP 110/72 | HR 79 | Ht 61.5 in | Wt 149.0 lb

## 2017-02-17 DIAGNOSIS — E119 Type 2 diabetes mellitus without complications: Secondary | ICD-10-CM

## 2017-02-17 DIAGNOSIS — E784 Other hyperlipidemia: Secondary | ICD-10-CM | POA: Diagnosis not present

## 2017-02-17 DIAGNOSIS — Z794 Long term (current) use of insulin: Secondary | ICD-10-CM

## 2017-02-17 DIAGNOSIS — E1165 Type 2 diabetes mellitus with hyperglycemia: Secondary | ICD-10-CM | POA: Diagnosis not present

## 2017-02-17 DIAGNOSIS — E7849 Other hyperlipidemia: Secondary | ICD-10-CM

## 2017-02-17 LAB — POCT GLYCOSYLATED HEMOGLOBIN (HGB A1C): Hemoglobin A1C: 9.2

## 2017-02-17 MED ORDER — REPAGLINIDE 1 MG PO TABS: 1.0000 mg | ORAL_TABLET | Freq: Three times a day (TID) | ORAL | 5 refills | Status: DC

## 2017-02-17 MED ORDER — INSULIN GLARGINE 300 UNIT/ML ~~LOC~~ SOPN: 70.0000 [IU] | PEN_INJECTOR | Freq: Every day | SUBCUTANEOUS | 5 refills | Status: DC

## 2017-02-17 NOTE — Progress Notes (Signed)
Subjective:     Patient ID: Rachel [name], female   DOB: [birth year], 51 y.o.   MRN: [id number]  HPI   Review of Systems   Objective:   Physical Exam   Assessment:       Plan:

## 2017-02-17 NOTE — Patient Instructions (Addendum)
Please continue: - Lantus 36 units in am - Metformin XR 1000 mg 2x a day with b'fast and dinner - Januvia 100 mg in am   Please stop at the lab.  Depending on the new HbA1c, we may need to start Prandin 3x a day before meals.  Please return in 3 months with your sugar log.

## 2017-02-17 NOTE — Progress Notes (Addendum)
Subjective:     Patient ID: Rachel [name], female   DOB: Apr 30, 1966, 51 y.o.   MRN: [id number]  HPI Rachel [name] is a pleasant 51 y.o. woman, returning for f/u for DM2, dx 1987, uncontrolled, insulin-dependent, with probable complications (? peripheral neuropathy). Last visit 3 mo ago.  Last HbA1C: Lab Results  Component Value Date   HGBA1C 9.2 11/11/2016   HGBA1C 11.5 (H) 06/25/2016   HGBA1C 10.9 04/29/2015  Prev. 10.1%.  Reviewed hx: She came off all DM medicines for 6 mo before the HbA1c in 09/2013. She again ran out of all meds in 04/2016.  She is now  on: - Lantus 36 units at bedtime >> moved to am as she was forgetting it at night - Metformin XR 1000 mg 2x a day with b'fast and dinner - Januvia 100 mg in am  We triedTrulicity 1.5 mg weekly >> worked great but had to stop b/c GERD >> stopped 2 weeks ago. We tried Jardiance 25 mg daily >> started 08/2016 >> but stopped recently 2/2 recurrent UTIs She was on Glipizide 5 mg bid - added 04/2015 >> was not taking it b/c low CBGs. We stopped Glipizide XL 5 mg in 07/2014. She had episodes of hypoglycemia with Amaryl in the past. She was on Bydureon 2 mg weekly (had nausea).  She checks her sugars 1-2x a day: - am:   72-178, most 106-145 >> 102-110, 260 this am >> 130s - after coffee >> 170 >> after b'fast: 158-251 >> n/c - before lunch: 300s (only checks when thirsty) >> n/c >> 137 >> n/c - after lunch: n/c >> checks seldom, only if feeling poorly >> up to 200s - before dinner: 276 >> 191, 308 >> n/c - after dinner: 166-245 >> n/c - bedtime:n/c >> 130, 145-245, 282 >> 140-150 >> 140-150s She has hypoglycemia awareness in the 90s. Lowest: 38 (while on Trulicity). Highest 600 >> 190 >> HI but 300s recently >> 308 >> 260s.  - She has seen nutrition   - she has HL. Last lipids: Lab Results  Component Value Date   CHOL 215 (H) 06/25/2016   HDL 58.50 06/25/2016   LDLCALC 198 (H) 01/09/2015   LDLDIRECT 122.0 06/25/2016   TRIG 296.0  (H) 06/25/2016   CHOLHDL 4 06/25/2016  On Crestor 40 now. 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  Review of Systems Constitutional: no weight gain/no weight loss, no fatigue, + Hot flashes, no subjective hypothermia Eyes: no blurry vision, no xerophthalmia ENT: no sore throat, no nodules palpated in throat, no dysphagia, no odynophagia, no hoarseness Cardiovascular: no CP/no SOB/no palpitations/no leg swelling Respiratory: no cough/no SOB/no wheezing Gastrointestinal: no N/no V/no D/no C/+ acid reflux Musculoskeletal: no muscle aches/no joint aches Skin: no rashes, no hair loss Neurological: no tremors/no numbness/no tingling/no dizziness  I reviewed pt's medications, allergies, PMH, social hx, family hx, and changes were documented in the history of present illness. Otherwise, unchanged from my initial visit note.   Objective:   Physical Exam BP 110/72   Pulse 79   Ht 5' 1.5" (1.562 m)   Wt 149 lb (67.6 kg)   SpO2 98%   BMI 27.70 kg/m  Body mass index is 27.7 kg/m.  Wt Readings from Last 3 Encounters:  02/17/17 149 lb (67.6 kg)  12/28/16 148 lb 9 oz (67.4 kg)  11/11/16 147 lb (66.7 kg)   Constitutional: overweight, in NAD Eyes: PERRLA, EOMI, no exophthalmos ENT: moist mucous membranes, no thyromegaly, no cervical lymphadenopathy Cardiovascular: RRR, No MRG Respiratory: CTA B Gastrointestinal: abdomen soft, NT, ND, BS+ Musculoskeletal: no deformities, strength intact in all 4 Skin: moist, warm, no rashes Neurological: no tremor with outstretched hands, DTR normal in all 4  Assessment:     1. DM2, uncontrolled, insulin-dependent, with probable complications - ? peripheral neuropathy  2. HL  Plan:     1. Patient with uncontrolled diabetes, With stable  control since last visit, despite coming off Trulicity due to severe GERD. She stopped the medication 2 weeks ago and she did not seem a significant increase in blood sugars since. She is currently on Lantus, which she moved to a.m. as she was forgetting to take the dose at night, and also Januvia and metformin ER. She has occasional sugars in the 200s during the day, which are symptomatic. We would need another medicine to cover her meals, and we discussed about different options. Since she could not tolerate GLP-1 receptor agonist, a sulfonylurea, and an SGLT2 inhibitor, we will try to use Prandin. - However, since her sugars checked at home are not as high as indicated by her HbA1c today, I would like to check a fructosamine first and only add Prandin if the HbA1c calculated from the fructosamine is still high. - I advised her to: Patient Instructions  Please continue: - Lantus 36 units in am - Metformin XR 1000 mg 2x a day with b'fast and dinner - Januvia 100 mg in am   Please stop at the lab.  Depending on the new HbA1c, we may need to start Prandin 3x a day before meals.  Please return in 3 months with your sugar log.   - today, HbA1c is 9.2% (stable) - continue checking sugars at different times of the day - check 1x a day, rotating checks - advised for yearly eye exams >> she is UTD - Return to clinic in 3 mo with sugar log   2. HL - Reviewed together her previous lipid panels which showed a high LDL, improved at last check  - Her statin was changed to Crestor 40 mg daily  - She may be a candidate for PCSK9 inhibitors if the next lipid panel is not at goal   Office Visit on 02/17/2017  Component Date Value Ref Range Status  . Hemoglobin A1C 02/17/2017 9.2   Final  . Fructosamine 02/17/2017 396* 190 - 270 umol/L Final   HbA1c calculated from fructosamine is better, at 8.34%, but still high. Will try to add Prandin.  Philemon Kingdom, MD PhD [facility]

## 2017-02-17 NOTE — Telephone Encounter (Signed)
Patient is due to come in to have Botox on 03/04/17 and she will be out of town. She will be leaving on 9/19 and coming back a few days later. Is there another day that she can come in for her Botox. Please Advise. Thanks

## 2017-02-17 NOTE — Addendum Note (Signed)
Addended by: Kaylyn Lim I on: 02/17/2017 11:29 AM   Modules accepted: Orders

## 2017-02-17 NOTE — Telephone Encounter (Signed)
Patient needs to reschedule botox Appt. She will be out of town. Please Call. Thanks

## 2017-02-18 ENCOUNTER — Telehealth: Payer: Self-pay | Admitting: Internal Medicine

## 2017-02-18 NOTE — Telephone Encounter (Signed)
Routing to you °

## 2017-02-18 NOTE — Telephone Encounter (Signed)
Patient doe snot want to take the [facility] but she wants the Lantus Solostar so that there is no copay. Call pharmacy to advise as soon as possible.

## 2017-02-18 NOTE — Telephone Encounter (Signed)
Just put her in a follow up slot.

## 2017-02-18 NOTE — Telephone Encounter (Signed)
Dr Tomi Likens, is there another date prior to your next Botox date you would want to see her?

## 2017-02-18 NOTE — Telephone Encounter (Signed)
Patient does not want to switch to Tujeo. It's not covered by her insurance and she can not afford 85 a month for it.  Please advise,  Ty,  -LL

## 2017-02-21 ENCOUNTER — Other Ambulatory Visit: Payer: Self-pay

## 2017-02-21 LAB — FRUCTOSAMINE: Fructosamine: 396 umol/L — ABNORMAL HIGH (ref 190–270)

## 2017-02-21 MED ORDER — INSULIN GLARGINE 100 UNIT/ML SOLOSTAR PEN: PEN_INJECTOR | SUBCUTANEOUS | 1 refills | Status: DC

## 2017-02-21 NOTE — Telephone Encounter (Signed)
Submitted Lantus to pharmacy.

## 2017-02-21 NOTE — Telephone Encounter (Signed)
OK, no pb! 

## 2017-02-21 NOTE — Telephone Encounter (Signed)
Please advise. Patient is wanting Lantus. Thanks!

## 2017-02-22 ENCOUNTER — Ambulatory Visit (INDEPENDENT_AMBULATORY_CARE_PROVIDER_SITE_OTHER): Payer: 59 | Admitting: Neurology

## 2017-02-22 DIAGNOSIS — G43709 Chronic migraine without aura, not intractable, without status migrainosus: Secondary | ICD-10-CM | POA: Diagnosis not present

## 2017-02-22 MED ORDER — REPAGLINIDE 1 MG PO TABS: 1.0000 mg | ORAL_TABLET | Freq: Three times a day (TID) | ORAL | 5 refills | Status: DC

## 2017-02-22 MED ORDER — ONABOTULINUMTOXINA 100 UNITS IJ SOLR
200.0000 [IU] | Freq: Once | INTRAMUSCULAR | Status: AC
  Administered 2017-02-22: 200 [IU] via INTRAMUSCULAR

## 2017-02-22 MED ORDER — ONABOTULINUMTOXINA 100 UNITS IJ SOLR
200.0000 [IU] | Freq: Once | INTRAMUSCULAR | Status: AC
Start: 2017-02-22 — End: 2017-02-22
  Administered 2017-02-22: 200 [IU] via INTRAMUSCULAR

## 2017-02-22 NOTE — Addendum Note (Signed)
Addended by: Philemon Kingdom on: 02/22/2017 12:39 PM   Modules accepted: Orders

## 2017-02-22 NOTE — Progress Notes (Signed)
[facility]   Procedure Note Botox  Attending: Dr. Tomi Likens  Preoperative Diagnosis(es): Chronic migraine  Consent obtained from: The patient Benefits discussed included, but were not limited to decreased muscle tightness, increased joint range of motion, and decreased pain.  Risk discussed included, but were not limited pain and discomfort, bleeding, bruising, excessive weakness, venous thrombosis, muscle atrophy and dysphagia.  Anticipated outcomes of the procedure as well as he risks and benefits of the alternatives to the procedure, and the roles and tasks of the personnel to be involved, were discussed with the patient, and the patient consents to the procedure and agrees to proceed. A copy of the patient medication guide was given to the patient which explains the blackbox warning.  Patients identity and treatment sites confirmed Yes.  .  Details of Procedure: Skin was cleaned with alcohol. Prior to injection, the needle plunger was aspirated to make sure the needle was not within a blood vessel.  There was no blood retrieved on aspiration.    Following is a summary of the muscles injected  And the amount of Botulinum toxin used:  Dilution 200 units of Botox was reconstituted with 4 ml of preservative free normal saline. Time of reconstitution: At the time of the office visit (<30 minutes prior to injection)   Injections  155 total units of Botox was injected with a 30 gauge needle.  Injection Sites: L occipitalis: 15 units- 3 sites  R occiptalis: 15 units- 3 sites  L upper trapezius: 15 units- 3 sites R upper trapezius: 15 units- 3 sits          L paraspinal: 10 units- 2 sites R paraspinal: 10 units- 2 sites  Face L frontalis(2 injection sites):10 units   R frontalis(2 injection sites):10 units         L corrugator: 5 units   R corrugator: 5 units           Procerus: 5 units   L temporalis: 20 units R temporalis: 20 units   Agent:  200 units of botulinum Type A  (Onobotulinum Toxin type A) was reconstituted with 4 ml of preservative free normal saline.  Time of reconstitution: At the time of the office visit (<30 minutes prior to injection)     Total injected (Units): 155  Total wasted (Units): 45  Patient tolerated procedure well without complications.   Reinjection is anticipated in 3 months. Return to clinic in 4.5 months.  Rachel Partridge, DO

## 2017-03-04 ENCOUNTER — Ambulatory Visit: Payer: 59 | Admitting: Neurology

## 2017-03-07 NOTE — Progress Notes (Signed)
Appt cancelled This encounter was created in error - please disregard. 

## 2017-03-08 DIAGNOSIS — Z0279 Encounter for issue of other medical certificate: Secondary | ICD-10-CM

## 2017-03-17 NOTE — Addendum Note (Signed)
Addended by: Tomi Likens, Selestino Nila R on: 03/17/2017 08:54 AM   Modules accepted: Level of Service

## 2017-03-22 NOTE — Progress Notes (Signed)
This encounter was created in error - please disregard.

## 2017-03-22 NOTE — Addendum Note (Signed)
Addended byTomi Likens, Sybil Shrader R on: 03/22/2017 03:17 PM   Modules accepted: Level of Service, SmartSet

## 2017-03-31 ENCOUNTER — Other Ambulatory Visit: Payer: Self-pay | Admitting: Internal Medicine

## 2017-03-31 MED ORDER — ONDANSETRON HCL 4 MG PO TABS: 4.0000 mg | ORAL_TABLET | Freq: Three times a day (TID) | ORAL | 1 refills | Status: DC | PRN

## 2017-03-31 NOTE — Telephone Encounter (Signed)
Faxed

## 2017-03-31 NOTE — Telephone Encounter (Signed)
Done hardcopy to [name]  

## 2017-04-27 ENCOUNTER — Encounter: Payer: Self-pay | Admitting: Family Medicine

## 2017-04-27 ENCOUNTER — Encounter: Payer: Self-pay | Admitting: Internal Medicine

## 2017-04-27 ENCOUNTER — Ambulatory Visit (INDEPENDENT_AMBULATORY_CARE_PROVIDER_SITE_OTHER): Payer: 59 | Admitting: Family Medicine

## 2017-04-27 VITALS — BP 102/78 | HR 95 | Ht 61.0 in | Wt 147.0 lb

## 2017-04-27 DIAGNOSIS — R51 Headache: Secondary | ICD-10-CM

## 2017-04-27 DIAGNOSIS — M999 Biomechanical lesion, unspecified: Secondary | ICD-10-CM | POA: Insufficient documentation

## 2017-04-27 DIAGNOSIS — G4486 Cervicogenic headache: Secondary | ICD-10-CM | POA: Insufficient documentation

## 2017-04-27 HISTORY — DX: Cervicogenic headache: G44.86

## 2017-04-27 NOTE — Progress Notes (Signed)
[name] [name], [name] [address] Phone: [phone] Subjective:    I'm seeing this patient by the request  of:  Jaffe DO  CC: Headaches  TIR:[id number]  Rachel [name] is a 51 y.o. female coming in for cervical spine pain. She wakes up with headaches for months. She said that the pain starts in in both upper traps and transitions to her head. She has tried to different pillows. She has tried NSAIDs and they have not touched the pain. She has tried botox injections as well but her pain continues to come back.  Patient is even undergoing Botox injections at this time to try to help with the daily headaches that she is having.  Seems to originate from the neck.  No radiation down the arm but seems to go down the middle of her back.  Sometimes can be incapacitating.  States sometimes this can be associated with some mild dizziness.  Patient has had fairly poor control of her blood sugars recently with last hemoglobin A1c at 9.2.         Past Medical History:  Diagnosis Date  . ALLERGIC RHINITIS 10/04/2007  . Anemia 01/21/2011  . ASTHMA 08/02/2007   INHALERS ONLY IN [place]  . Blood transfusion without reported diagnosis    with first child   . COMMON MIGRAINE 05/15/2009  . DIABETES MELLITUS, TYPE II 08/02/2007  . GERD 08/02/2007  . HYPERLIPIDEMIA 08/02/2007  . INSOMNIA-SLEEP DISORDER-UNSPEC 01/04/2008  . INTERMITTENT VERTIGO 05/15/2009  . LIBIDO, DECREASED 01/09/2010   Past Surgical History:  Procedure Laterality Date  . CESAREAN SECTION     x 3  . CYST EXCISION     left knee   Social History   Socioeconomic History  . Marital status: Married    Spouse name: [name]  . Number of children: 3  . Years of education: Degree  . Highest education level: None  Social Needs  . Financial resource strain: None  . Food insecurity - worry: None  . Food insecurity - inability: None  . Transportation needs - medical: None  . Transportation needs -  non-medical: None  Occupational History  . Occupation: Research scientist (life sciences): [employer]  . Occupation: Chartered certified accountant    Employer: [employer]  Tobacco Use  . Smoking status: Never Smoker  . Smokeless tobacco: Never Used  Substance and Sexual Activity  . Alcohol use: No    Alcohol/week: 0.0 oz  . Drug use: No  . Sexual activity: Yes  Other Topics Concern  . None  Social History Narrative   She has 3 daughters.   Patient has a 4 year degree.    Patient working at [employer].    Patient is married to Howe.   Allergies  Allergen Reactions  . Lipitor [Atorvastatin]     REACTION: myalgias  . Nitroglycerin    Family History  Problem Relation Age of Onset  . Hypertension Father   . Diabetes Father   . Asthma Father   . Cervical cancer Maternal Aunt   . Heart disease Maternal Aunt   . Anesthesia problems Neg Hx   . Colon cancer Neg Hx      Past medical history, social, surgical and family history all reviewed in electronic medical record.  No pertanent information unless stated regarding to the chief complaint.   Review of Systems:Review of systems updated and as accurate as of 04/27/17  No headache, visual changes,  nausea, vomiting, diarrhea, constipation, dizziness, abdominal pain, skin rash, fevers, chills, night sweats, weight loss, swollen lymph nodes, body aches, joint swelling, muscle aches, chest pain, shortness of breath, mood changes.   Objective  Blood pressure 102/78, pulse 95, height 5\' 1"  (1.549 m), weight 147 lb (66.7 kg), SpO2 95 %. Systems examined below as of 04/27/17   General: No apparent distress alert and oriented x3 mood and affect normal, dressed appropriately.  HEENT: Pupils equal, extraocular movements intact  Respiratory: Patient's speak in full sentences and does not appear short of breath  Cardiovascular: No lower extremity edema, non tender, no erythema  Skin: Warm dry intact with no signs of infection or rash on extremities or  on axial skeleton.  Abdomen: Soft nontender  Neuro: Cranial nerves II through XII are intact, neurovascularly intact in all extremities with 2+ DTRs and 2+ pulses.  Lymph: No lymphadenopathy of posterior or anterior cervical chain or axillae bilaterally.  Gait normal with good balance and coordination.  MSK:  Non tender with full range of motion and good stability and symmetric strength and tone of shoulders, elbows, wrist, hip, knee and ankles bilaterally.   Neck: Inspection loss of lordosis. No palpable stepoffs. Negative Spurling's maneuver. Mild limitation with side bending bilaterally. Grip strength and sensation normal in bilateral hands Strength good C4 to T1 distribution No sensory change to C4 to T1 Negative Hoffman sign bilaterally Reflexes normal  Osteopathic findings C2 flexed rotated and side bent right C4 flexed rotated and side bent left C7 flexed rotated and side bent left T3 extended rotated and side bent right inhaled third rib T6 extended rotated and side bent left L3 flexed rotated and side bent right Sacrum right on right  Procedure 97110; 15 additional minutes spent for Therapeutic exercises as stated in above notes.  This included exercises focusing on stretching, strengthening, with significant focus on eccentric aspects.   Long term goals include an improvement in range of motion, strength, endurance as well as avoiding reinjury. Patient's frequency would include in 1-2 times a day, 3-5 times a week for a duration of 6-12 weeks.  Exercises that included:  Basic scapular stabilization to include adduction and depression of scapula Scaption, focusing on proper movement and good control Internal and External rotation utilizing a theraband, with elbow tucked at side entire time Rows with theraband  Proper technique shown and discussed handout in great detail with ATC.  All questions were discussed and answered.     Impression and Recommendations:     This  case required medical decision making of moderate complexity.      Note: This dictation was prepared with Dragon dictation along with smaller phrase technology. Any transcriptional errors that result from this process are unintentional.

## 2017-04-27 NOTE — Patient Instructions (Addendum)
Good to see you  Keep monitor at eye level  Tennis ball between shoulder blades with sitting Exercises 3 times a week.  2 tennis ball in a tube sock and lay on them where head meets neck nightly for 5 minutes Start iron 65mg  daily with 500mg  of vitamin C See me again in 3-4 weeks

## 2017-04-27 NOTE — Assessment & Plan Note (Signed)
Decision today to treat with OMT was based on Physical Exam  After verbal consent patient was treated with HVLA, ME, FPR techniques in cervical, thoracic, lumbar and sacral areas  Patient tolerated the procedure well with improvement in symptoms  Patient given exercises, stretches and lifestyle modifications  See medications in patient instructions if given  Patient will follow up in 3-4 weeks  

## 2017-04-27 NOTE — Assessment & Plan Note (Signed)
Cervicogenic headache.  Patient has been having headache for quite some time.  Patient is going to be doing home exercise, icing regimen, working on the scapula.  Patient did respond fairly well to osteopathic manipulation.  Hopefully this will continue to help.  We discussed over-the-counter medications.  Do not feel that we need to make any other significant changes at this time.  Follow-up again in 3-4 weeks.

## 2017-04-30 ENCOUNTER — Ambulatory Visit: Payer: 59 | Admitting: Family Medicine

## 2017-05-11 ENCOUNTER — Ambulatory Visit (INDEPENDENT_AMBULATORY_CARE_PROVIDER_SITE_OTHER): Payer: 59 | Admitting: Internal Medicine

## 2017-05-11 ENCOUNTER — Encounter: Payer: Self-pay | Admitting: Internal Medicine

## 2017-05-11 VITALS — HR 90 | Temp 98.6°F | Ht 61.0 in | Wt 148.0 lb

## 2017-05-11 DIAGNOSIS — G43009 Migraine without aura, not intractable, without status migrainosus: Secondary | ICD-10-CM | POA: Diagnosis not present

## 2017-05-11 DIAGNOSIS — Z794 Long term (current) use of insulin: Secondary | ICD-10-CM

## 2017-05-11 DIAGNOSIS — J309 Allergic rhinitis, unspecified: Secondary | ICD-10-CM

## 2017-05-11 DIAGNOSIS — I951 Orthostatic hypotension: Secondary | ICD-10-CM | POA: Insufficient documentation

## 2017-05-11 DIAGNOSIS — J019 Acute sinusitis, unspecified: Secondary | ICD-10-CM | POA: Diagnosis not present

## 2017-05-11 DIAGNOSIS — E1165 Type 2 diabetes mellitus with hyperglycemia: Secondary | ICD-10-CM | POA: Diagnosis not present

## 2017-05-11 MED ORDER — BUTALBITAL-APAP-CAFFEINE 50-325-40 MG PO TABS: ORAL_TABLET | ORAL | 0 refills | Status: DC

## 2017-05-11 MED ORDER — LEVOFLOXACIN 500 MG PO TABS: 500.0000 mg | ORAL_TABLET | Freq: Every day | ORAL | 0 refills | Status: AC

## 2017-05-11 NOTE — Patient Instructions (Addendum)
Please take all new medication as prescribed- the antibiotic  You should also take OTC Allegra and Nasacort for better allergy control  Please drink more fluids in the next few days14  Please check your sugar occasionally during the day (before lunch, dinner and bedtime) as this can show how your sugar is doing during the day when you are eating  Please continue all other medications as before (including the metformin as prescribed), and refills have been done if requested - the migraine medication  Please have the pharmacy call with any other refills you may need.  Please continue your efforts at being more active, low cholesterol diet, and weight control.  Please keep your appointments with your specialists as you may have planned - Endo in December  Please return in 3 months, or sooner if needed

## 2017-05-11 NOTE — Progress Notes (Signed)
Subjective:    Patient ID: Rachel [name], female    DOB: 07/09/1965, 51 y.o.   MRN: [id number]  HPI    Here with dizziness mild on standing for several days,  Also with 2-3 days acute onset fever, facial pain, pressure, headache, general weakness and malaise, and greenish d/c, with mild ST and cough and reduced po intake, but pt denies chest pain, wheezing, increased sob or doe, orthopnea, PND, increased LE swelling, palpitations, dizziness or syncope.  Mouth dry, ST and less saliva than usual.  CBG 117 this am, so does not believe related to DM, has not taken metformin in the past wk on her own.  Does have several wks ongoing nasal allergy symptoms with clearish congestion, itch and sneezing.   Past Medical History:  Diagnosis Date  . ALLERGIC RHINITIS 10/04/2007  . Anemia 01/21/2011  . ASTHMA 08/02/2007   INHALERS ONLY IN [place]  . Blood transfusion without reported diagnosis    with first child   . COMMON MIGRAINE 05/15/2009  . DIABETES MELLITUS, TYPE II 08/02/2007  . GERD 08/02/2007  . HYPERLIPIDEMIA 08/02/2007  . INSOMNIA-SLEEP DISORDER-UNSPEC 01/04/2008  . INTERMITTENT VERTIGO 05/15/2009  . LIBIDO, DECREASED 01/09/2010   Past Surgical History:  Procedure Laterality Date  . CESAREAN SECTION     x 3  . CYST EXCISION     left knee  . PAROTIDECTOMY  10/21/2011   Procedure: PAROTIDECTOMY;  Surgeon: Melida Quitter, MD;  Location: [facility];  Service: ENT;  Laterality: Left;    reports that  has never smoked. she has never used smokeless tobacco. She reports that she does not drink alcohol or use drugs. family history includes Asthma in her father; Cervical cancer in her maternal aunt; Diabetes in her father; Heart disease in her maternal aunt; Hypertension in her father. Allergies  Allergen Reactions  . Lipitor [Atorvastatin]     REACTION: myalgias  . Nitroglycerin    Current Outpatient Medications on File Prior to Visit  Medication Sig Dispense Refill  . ACCU-CHEK FASTCLIX LANCETS MISC Use to  check blood sugars twice times a day. Dx E11.9 102 each 3  . aspirin 81 MG EC tablet Take 81 mg by mouth daily.      . Blood Glucose Monitoring Suppl (ACCU-CHEK GUIDE) w/Device KIT 1 Device by Does not apply route 2 (two) times daily. Use to check blood sugars twice a day Dx. E11.9 1 kit 0  . DENTA 5000 PLUS 1.1 % CREA dental cream   3  . divalproex (DEPAKOTE ER) 500 MG 24 hr tablet Take 1 tablet (500 mg total) by mouth at bedtime. 30 tablet 0  . esomeprazole (NEXIUM) 40 MG capsule Take 1 capsule (40 mg total) by mouth 2 (two) times daily before a meal. 180 capsule 3  . glucose blood (ACCU-CHEK GUIDE) test strip Use to check blood sugars twice a day Dx E11.9 100 each 3  . Insulin Glargine (LANTUS SOLOSTAR) 100 UNIT/ML Solostar Pen Inject 36 units in the morning. 15 pen 1  . Insulin Pen Needle (UNIFINE PENTIPS) 32G X 4 MM MISC USE 1x a day 100 each PRN  . meloxicam (MOBIC) 15 MG tablet Take 1 tablet (15 mg total) by mouth daily. 30 tablet 0  . metFORMIN (GLUCOPHAGE-XR) 500 MG 24 hr tablet TAKE 2 TABLETS BY MOUTH 2 TIMES DAILY WITH A MEAL. 120 tablet 5  . methocarbamol (ROBAXIN) 500 MG tablet Take 1 tablet (500 mg total) by mouth 2 (two) times daily as needed for  muscle spasms. 30 tablet 2  . OnabotulinumtoxinA (BOTOX IJ) Inject 1 each as directed every 3 (three) months.    . ondansetron (ZOFRAN) 4 MG tablet Take 1 tablet (4 mg total) by mouth every 8 (eight) hours as needed for nausea or vomiting. 20 tablet 1  . repaglinide (PRANDIN) 1 MG tablet Take 1 tablet (1 mg total) by mouth 3 (three) times daily before meals. 90 tablet 5  . rosuvastatin (CRESTOR) 40 MG tablet Take 1 tablet (40 mg total) by mouth daily. 90 tablet 3  . traMADol (ULTRAM) 50 MG tablet TAKE 1 TABLET BY MOUTH EVERY 8 HOURS AS NEEDED 60 tablet 2  . SUMAtriptan Succinate (ZEMBRACE SYMTOUCH) 3 MG/0.5ML SOAJ Inject 1 Device into the skin once. 1 pen 0  . SUMAtriptan Succinate (ZEMBRACE SYMTOUCH) 3 MG/0.5ML SOAJ Inject 1 Device into the  skin once. May repeat dose once after 1 hour if headache persists or returns (do not exceed two injections in 24 hours) 9 pen 0   No current facility-administered medications on file prior to visit.    Review of Systems  Constitutional: Negative for other unusual diaphoresis or sweats HENT: Negative for ear discharge or swelling Eyes: Negative for other worsening visual disturbances Respiratory: Negative for stridor or other swelling  Gastrointestinal: Negative for worsening distension or other blood Genitourinary: Negative for retention or other urinary change Musculoskeletal: Negative for other MSK pain or swelling Skin: Negative for color change or other new lesions Neurological: Negative for worsening tremors and other numbness  Psychiatric/Behavioral: Negative for worsening agitation or other fatigue' \\All'$  other system neg per pt    Objective:   Physical Exam  Pulse 90   Temp 98.6 F (37 C) (Oral)   Ht '5\' 1"'$  (1.549 m)   Wt 148 lb (67.1 kg)   SpO2 99%   BMI 27.96 kg/m  Orthostatics  - laying 122/78, sitting 116/80, stand 110/76 VS noted, mild ill Constitutional: Pt appears in NAD HENT: Head: NCAT.  Right Ear: External ear normal.  Left Ear: External ear normal.  Eyes: . Pupils are equal, round, and reactive to light. Conjunctivae and EOM are normal Nose: without d/c or deformity Bilat tm's with mild erythema.  Max sinus areas mild tender.  Pharynx with mild erythema, no exudate Neck: Neck supple. Gross normal ROM Cardiovascular: Normal rate and regular rhythm.   Pulmonary/Chest: Effort normal and breath sounds without rales or wheezing.  Neurological: Pt is alert. At baseline orientation, motor grossly intact Skin: Skin is warm. No rashes, other new lesions, no LE edema Psychiatric: Pt behavior is normal without agitation  No other exam findings  Lab Results  Component Value Date   WBC 9.5 06/25/2016   HGB 12.3 06/25/2016   HCT 36.6 06/25/2016   PLT 364.0  06/25/2016   GLUCOSE 260 (H) 06/25/2016   CHOL 215 (H) 06/25/2016   TRIG 296.0 (H) 06/25/2016   HDL 58.50 06/25/2016   LDLDIRECT 122.0 06/25/2016   LDLCALC 198 (H) 01/09/2015   ALT 14 06/25/2016   AST 13 06/25/2016   NA 135 06/25/2016   K 3.9 06/25/2016   CL 101 06/25/2016   CREATININE 0.68 06/25/2016   BUN 10 06/25/2016   CO2 27 06/25/2016   TSH 0.90 06/25/2016   INR 1.08 07/23/2009   HGBA1C 9.2 02/17/2017   MICROALBUR <0.7 06/25/2016   MRI HEAD WITHOUT AND WITH CONTRAST 02-10-16 -  IMPRESSION only -  Unchanged appearance of the brain.  No acute or significant finding.  Cardiac CTA -  07-01-14 IMPRESSION: 1) Coronary calcium score 10 small foci in proximal and mid LAD 88th percentile for age and sex matched controls  2) No obstructive CAD. Mild calcific plaque in proximal and mid LAD and mild soft plaque in proximal RCA Right dominant coronary arteries  3) Normal ascending aortic root 3.0 cm  4) No significant non cardiac findings    Assessment & Plan:

## 2017-05-11 NOTE — Assessment & Plan Note (Signed)
Improved for 1 wk after tx with Dr [name], urged to f/u

## 2017-05-15 NOTE — Assessment & Plan Note (Signed)
Mild to mod, for antibx course,  to f/u any worsening symptoms or concerns 

## 2017-05-15 NOTE — Assessment & Plan Note (Signed)
Mild to mod, for allegra/nasacort asd,  to f/u any worsening symptoms or concerns

## 2017-05-15 NOTE — Assessment & Plan Note (Signed)
stable overall by history and exam, recent data reviewed with pt, and pt to continue medical treatment as before,  to f/u any worsening symptoms or concerns, urged compliance with meds as rx, and f/u endo as planned

## 2017-05-15 NOTE — Assessment & Plan Note (Signed)
Stable, for med refill 

## 2017-05-15 NOTE — Assessment & Plan Note (Signed)
Likely due to reduced po intake, encouraged oral fluids

## 2017-05-19 ENCOUNTER — Other Ambulatory Visit: Payer: Self-pay | Admitting: *Deleted

## 2017-05-19 NOTE — Patient Outreach (Signed)
[facility] transitioned from the [employer] [facility] program to the [employer] on 06/25/16 for Type II diabetes self-management assistance so will close case to the diabetes [facility] program due to delegation of disease management services to [employer] from [employer] for [place] members in 2019. Barrington Ellison RN,CCM,CDE [facility] Management Coordinator [facility] and [employer] [phone] Office Fax [phone]

## 2017-05-24 ENCOUNTER — Ambulatory Visit: Payer: 59 | Admitting: Family Medicine

## 2017-05-25 ENCOUNTER — Ambulatory Visit: Payer: 59 | Admitting: Internal Medicine

## 2017-05-26 ENCOUNTER — Encounter: Payer: Self-pay | Admitting: Family Medicine

## 2017-05-26 ENCOUNTER — Ambulatory Visit (INDEPENDENT_AMBULATORY_CARE_PROVIDER_SITE_OTHER): Payer: 59 | Admitting: Family Medicine

## 2017-05-26 VITALS — BP 114/70 | HR 96 | Ht 60.0 in | Wt 149.0 lb

## 2017-05-26 DIAGNOSIS — R51 Headache: Secondary | ICD-10-CM

## 2017-05-26 DIAGNOSIS — M999 Biomechanical lesion, unspecified: Secondary | ICD-10-CM | POA: Diagnosis not present

## 2017-05-26 DIAGNOSIS — G4486 Cervicogenic headache: Secondary | ICD-10-CM

## 2017-05-26 NOTE — Patient Instructions (Signed)
Good to see you  Alvera Singh is your friend.  Continue with the exercises I am impressed See me again in 4 weeks

## 2017-05-26 NOTE — Progress Notes (Signed)
[name] [address] [place], [place] [address] Phone: [phone] Subjective:      CC: Neck and back pain  UMP:[id number]  Rachel [name] is a 51 y.o. female coming in with complaint of neck and back pain.  Patient does have some neck and back pain.  Also has had a positive rheumatoid factor and has been referred to rheumatology.  Patient has responded fairly well to osteopathic manipulation.  Doing fairly well with the new job as well.  Still having discomfort and pain.  Felt like she had no headaches whatsoever for 2 weeks after the manipulation.  Patient was happy with the results but now pain is coming back again.      Past Medical History:  Diagnosis Date  . ALLERGIC RHINITIS 10/04/2007  . Anemia 01/21/2011  . ASTHMA 08/02/2007   INHALERS ONLY IN [place]  . Blood transfusion without reported diagnosis    with first child   . COMMON MIGRAINE 05/15/2009  . DIABETES MELLITUS, TYPE II 08/02/2007  . GERD 08/02/2007  . HYPERLIPIDEMIA 08/02/2007  . INSOMNIA-SLEEP DISORDER-UNSPEC 01/04/2008  . INTERMITTENT VERTIGO 05/15/2009  . LIBIDO, DECREASED 01/09/2010   Past Surgical History:  Procedure Laterality Date  . CESAREAN SECTION     x 3  . CYST EXCISION     left knee  . PAROTIDECTOMY  10/21/2011   Procedure: PAROTIDECTOMY;  Surgeon: Melida Quitter, MD;  [facility];  Service: ENT;  Laterality: Left;   Social History   Socioeconomic History  . Marital status: Married    Spouse name: [name]  . Number of children: 3  . Years of education: Degree  . Highest education level: Not on file  Social Needs  . Financial resource strain: Not on file  . Food insecurity - worry: Not on file  . Food insecurity - inability: Not on file  . Transportation needs - medical: Not on file  . Transportation needs - non-medical: Not on file  Occupational History  . Occupation: Research scientist (life sciences): [employer]  . Occupation: Chartered certified accountant    Employer: [employer]  Tobacco Use  . Smoking status: Never Smoker  . Smokeless tobacco: Never Used  Substance and Sexual Activity  . Alcohol use: No    Alcohol/week: 0.0 oz  . Drug use: No  . Sexual activity: Yes  Other Topics Concern  . Not on file  Social History Narrative   She has 3 daughters.   Patient has a 4 year degree.    Patient working at [employer].    Patient is married to Pajaros.   Allergies  Allergen Reactions  . Lipitor [Atorvastatin]     REACTION: myalgias  . Nitroglycerin    Family History  Problem Relation Age of Onset  . Hypertension Father   . Diabetes Father   . Asthma Father   . Cervical cancer Maternal Aunt   . Heart disease Maternal Aunt   . Anesthesia problems Neg Hx   . Colon cancer Neg Hx      Past medical history, social, surgical and family history all reviewed in electronic medical record.  No pertanent information unless stated regarding to the chief complaint.   Review of Systems:Review of systems updated and as accurate as of 05/26/17  No , visual changes, nausea, vomiting, diarrhea, constipation, dizziness, abdominal pain, skin rash, fevers, chills, night sweats, weight loss, swollen lymph node, joint swelling,  chest pain, shortness of breath, mood  changes.  Positive headaches, muscle aches, body aches  Objective  .Marland KitchenBlood pressure 114/70, pulse 96, height 5' (1.524 m), weight 149 lb (67.6 kg), SpO2 98 %.  Systems examined below as of 05/26/17   General: No apparent distress alert and oriented x3 mood and affect normal, dressed appropriately.  HEENT: Pupils equal, extraocular movements intact  Respiratory: Patient's speak in full sentences and does not appear short of breath  Cardiovascular: No lower extremity edema, non tender, no erythema  Skin: Warm dry intact with no signs of infection or rash on extremities or on axial skeleton.  Abdomen: Soft nontender  Neuro: Cranial nerves II through XII are intact, neurovascularly intact in all  extremities with 2+ DTRs and 2+ pulses.  Lymph: No lymphadenopathy of posterior or anterior cervical chain or axillae bilaterally.  Gait normal with good balance and coordination.  MSK:  Non tender with full range of motion and good stability and symmetric strength and tone of shoulders, elbows, wrist, hip, knee and ankles bilaterally.  Neck: Inspection loss of lordosis. No palpable stepoffs. Negative Spurling's maneuver. Lacks 10 degrees of sidebending bilaterally. Grip strength and sensation normal in bilateral hands Strength good C4 to T1 distribution No sensory change to C4 to T1 Negative Hoffman sign bilaterally Reflexes normal  Back exam shows more pain in the thoracolumbar junction.  Still has some mild scapular dyskinesis.  Some tightness of the right trapezius.  Negative Faber and negative straight leg test of the lower back.  Osteopathic findings C2 flexed rotated and side bent right C5 flexed rotated and side bent right  T3 extended rotated and side bent right inhaled third rib T10 extended rotated and side bent left L2 flexed rotated and side bent right Sacrum right on right     Impression and Recommendations:     This case required medical decision making of moderate complexity.      Note: This dictation was prepared with Dragon dictation along with smaller phrase technology. Any transcriptional errors that result from this process are unintentional.

## 2017-05-27 ENCOUNTER — Other Ambulatory Visit: Payer: Self-pay | Admitting: Internal Medicine

## 2017-05-27 NOTE — Telephone Encounter (Signed)
Done erx 

## 2017-05-27 NOTE — Assessment & Plan Note (Signed)
Doing much better at this time.  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  Hopefully patient will have more improvement in this last time.  Patient encouraged to do the home exercises on a more regular basis.  Given more postural exercises that I think will be beneficial.  Follow-up again 6 weeks.

## 2017-05-27 NOTE — Assessment & Plan Note (Signed)
Decision today to treat with OMT was based on Physical Exam  After verbal consent patient was treated with HVLA, ME, FPR techniques in cervical, thoracic, lumbar and sacral areas  Patient tolerated the procedure well with improvement in symptoms  Patient given exercises, stretches and lifestyle modifications  See medications in patient instructions if given  Patient will follow up in 6 weeks 

## 2017-05-30 ENCOUNTER — Other Ambulatory Visit: Payer: Self-pay

## 2017-05-30 MED ORDER — INSULIN GLARGINE 100 UNIT/ML SOLOSTAR PEN: PEN_INJECTOR | SUBCUTANEOUS | 1 refills | Status: DC

## 2017-05-31 DIAGNOSIS — I83813 Varicose veins of bilateral lower extremities with pain: Secondary | ICD-10-CM | POA: Diagnosis not present

## 2017-05-31 DIAGNOSIS — I87393 Chronic venous hypertension (idiopathic) with other complications of bilateral lower extremity: Secondary | ICD-10-CM | POA: Diagnosis not present

## 2017-06-01 ENCOUNTER — Telehealth: Payer: Self-pay

## 2017-06-01 NOTE — Telephone Encounter (Signed)
Called [facility], spoke with K.C no prior auth rqrd, buy and bill

## 2017-06-03 ENCOUNTER — Ambulatory Visit (INDEPENDENT_AMBULATORY_CARE_PROVIDER_SITE_OTHER): Payer: 59 | Admitting: Neurology

## 2017-06-03 DIAGNOSIS — G43709 Chronic migraine without aura, not intractable, without status migrainosus: Secondary | ICD-10-CM | POA: Diagnosis not present

## 2017-06-03 MED ORDER — ONABOTULINUMTOXINA 100 UNITS IJ SOLR
155.0000 [IU] | Freq: Once | INTRAMUSCULAR | Status: AC
  Administered 2017-06-03: 155 [IU] via INTRAMUSCULAR

## 2017-06-03 NOTE — Progress Notes (Signed)
[facility]   Procedure Note Botox  Attending: Dr. Metta Clines  Preoperative Diagnosis(es): Chronic migraine  Consent obtained from: The patient Benefits discussed included, but were not limited to decreased muscle tightness, increased joint range of motion, and decreased pain.  Risk discussed included, but were not limited pain and discomfort, bleeding, bruising, excessive weakness, venous thrombosis, muscle atrophy and dysphagia.  Anticipated outcomes of the procedure as well as he risks and benefits of the alternatives to the procedure, and the roles and tasks of the personnel to be involved, were discussed with the patient, and the patient consents to the procedure and agrees to proceed. A copy of the patient medication guide was given to the patient which explains the blackbox warning.  Patients identity and treatment sites confirmed?  Yes.  Details of Procedure: Skin was cleaned with alcohol. Prior to injection, the needle plunger was aspirated to make sure the needle was not within a blood vessel.  There was no blood retrieved on aspiration.    Following is a summary of the muscles injected  And the amount of Botulinum toxin used:  Dilution 200 units of Botox was reconstituted with 4 ml of preservative free normal saline. Time of reconstitution: At the time of the office visit (<30 minutes prior to injection)   Injections  155 total units of Botox was injected with a 30 gauge needle.  Injection Sites: L occipitalis: 15 units- 3 sites  R occiptalis: 15 units- 3 sites  L upper trapezius: 15 units- 3 sites R upper trapezius: 15 units- 3 sits          L paraspinal: 10 units- 2 sites R paraspinal: 10 units- 2 sites  Face L frontalis(2 injection sites):10 units   R frontalis(2 injection sites):10 units         L corrugator: 5 units   R corrugator: 5 units           Procerus: 5 units   L temporalis: 20 units R temporalis: 20 units   Agent:  200 units of botulinum Type  A (Onobotulinum Toxin type A) was reconstituted with 4 ml of preservative free normal saline.  Time of reconstitution: At the time of the office visit (<30 minutes prior to injection)     Total injected (Units): 155  Total wasted (Units): 0  Patient tolerated procedure well without complications.   Reinjection is anticipated in 3 months. Return to clinic next month.

## 2017-06-16 DIAGNOSIS — I83813 Varicose veins of bilateral lower extremities with pain: Secondary | ICD-10-CM | POA: Diagnosis not present

## 2017-06-23 ENCOUNTER — Other Ambulatory Visit: Payer: Self-pay | Admitting: Internal Medicine

## 2017-06-27 ENCOUNTER — Other Ambulatory Visit: Payer: Self-pay | Admitting: Internal Medicine

## 2017-06-28 ENCOUNTER — Ambulatory Visit (INDEPENDENT_AMBULATORY_CARE_PROVIDER_SITE_OTHER): Payer: 59 | Admitting: Neurology

## 2017-06-28 ENCOUNTER — Telehealth: Payer: Self-pay | Admitting: Internal Medicine

## 2017-06-28 ENCOUNTER — Encounter: Payer: Self-pay | Admitting: Neurology

## 2017-06-28 VITALS — BP 102/74 | HR 98 | Ht 60.0 in | Wt 147.4 lb

## 2017-06-28 DIAGNOSIS — G43009 Migraine without aura, not intractable, without status migrainosus: Secondary | ICD-10-CM

## 2017-06-28 DIAGNOSIS — M542 Cervicalgia: Secondary | ICD-10-CM | POA: Diagnosis not present

## 2017-06-28 MED ORDER — ELETRIPTAN HYDROBROMIDE 40 MG PO TABS: ORAL_TABLET | ORAL | 2 refills | Status: DC

## 2017-06-28 MED ORDER — ATENOLOL 25 MG PO TABS: 25.0000 mg | ORAL_TABLET | Freq: Every day | ORAL | 2 refills | Status: DC

## 2017-06-28 NOTE — Patient Instructions (Signed)
Migraine Recommendations: 1.  Start atenolol 25mg  daily.  Monitor for dizziness/lightheadedness. 2.  Take eleptriptan (Relpax) 40mg  at earliest onset of headache.  May repeat dose once in 2 hours if needed.  Do not exceed two tablets in 24 hours. 3.  Limit use of pain relievers to no more than 2 days out of the week.  These medications include acetaminophen, ibuprofen, triptans and narcotics.  This will help reduce risk of rebound headaches. 4.  Be aware of common food triggers such as processed sweets, processed foods with nitrites (such as deli meat, hot dogs, sausages), foods with MSG, alcohol (such as wine), chocolate, certain cheeses, certain fruits (dried fruits, bananas, some citrus fruit), vinegar, diet soda. 4.  Avoid caffeine 5.  Routine exercise 6.  Proper sleep hygiene 7.  Stay adequately hydrated with water 8.  Keep a headache diary. 9.  Maintain proper stress management. 10.  Do not skip meals. 11.  Consider supplements:  Magnesium citrate 400mg  to 600mg  daily, riboflavin 400mg , Coenzyme Q 10 100mg  three times daily 12.  FOLLOW UP FOR BOTOX 13.  Follow up with me in office in 6 months.

## 2017-06-28 NOTE — Progress Notes (Signed)
NEUROLOGY FOLLOW UP OFFICE NOTE  Rachel [name] [id number]  HISTORY OF PRESENT ILLNESS: Rachel [name] is a 52 year old right-handed female with type 2 diabetes, hyperlipidemia, GERD, asthma, intermittent vertigo, and migraine who follows up for migraine.   UPDATE: December was a difficult month.  She has having frequent headaches at that time.  However, on average: Intensity:  6/10; July: 6/10 Duration:  All day; July: All day (does not take anything because "nothing helps") Frequency:  5-6 headaches a month; July: 8-10 headache days per month (over 50% reduction in frequency since prior to Botox) Frequency of abortive medication:  Currently not taking anything. Current NSAIDS:  Mobic (for back pain, ineffective for headache) Current analgesics:  Tramadol (for back pain, ineffective for headache) Current triptans: no Current anti-emetic:  Zofran ODT 61m Current muscle relaxants: Robaxin   Current anti-anxiolytic:  no Current sleep aide:  no Current Antihypertensive medications:  no Current Antidepressant medications:  No.  She was prescribed nortriptyline but never started it due to potential side effects of an antidepressant. Current Anticonvulsant medications:  gabapentin 2087mCurrent Vitamins/Herbal/Supplements:  turmeric Current Antihistamines/Decongestants:  no Other therapy:  Botox (Status post 4 rounds), OMM  She drinks plenty of water.  She does not drink soda or alcohol.  She drinks 1 cup of coffee daily.  She does not exercise.   HISTORY: Migraines: Onset:  2011.  She does have remote history of headaches when she was younger. Location:  Back of head and radiates to the front Quality:  Shooting up from back of head, pounding on top and front of head Initial Intensity:  8/10 Aura:  no Prodrome:  no Associated symptoms:  Nausea, photophobia, phonophobia, blurred vision (she gets annual eye exams due to diabetes) Initial Duration:  All day Initial Frequency:  Almost daily  (cannot function 6 days per month) Triggers/exacerbating factors:  wine Relieving factors:  No Activity:  Cannot work about 6 days per month (she works as an onAdvertising account planner  Past abortive medication:  Sumatriptan 10015mineffective), ZemTraining and development officerid not like feeling), Zomig 5mg12m (did not like way it made her feel), Maxalt (dizziness, ineffective), Cambia (made headache worse), Excedrin, naproxen, Tylenol, Fioricet Past preventative medication:  topiramate, Depakote (stomach ache), venlafaxine XR 150mg19mbapentin Other past therapy:  none   Frequent Falls: For the past few years, she has had falls, but they have become more frequent over the past year.  Previously, she was diagnosed with vertigo.  However, she reports that she doesn't note spinning or lightheadedness.  She says she "just falls".  On one occasion, she was pushing the seat forward while sitting and just fell over.  Otherwise, it always occurs while walking or on her feet.  She denies double vision or focal numbness or weakness.  MRI of the brain with seizure protocol was performed on 08/02/11, which was unremarkable.  Sleep deprived EEG was normal.  She has had PT, which was ineffective.  She does report that she sometimes trips while walking but doesn't fall.   Cervical plain films from 05/07/15 showed mild degenerative disc disease at C5-6, but otherwise unremarkable.  Lumbar films from 06/10/15 were personally reviewed and were negative.  She underwent anothner MRI of brain with and without contrast on 02/10/16, and was stable compared to prior MRI from 2013.  NCV-EMG from 05/13/16 was negative for neuropathy or lumbosacral radiculopathy.  PAST MEDICAL HISTORY: Past Medical History:  Diagnosis Date  . ALLERGIC RHINITIS 10/04/2007  .  Anemia 01/21/2011  . ASTHMA 08/02/2007   INHALERS ONLY IN [place]  . Blood transfusion without reported diagnosis    with first child   . COMMON MIGRAINE 05/15/2009  . DIABETES  MELLITUS, TYPE II 08/02/2007  . GERD 08/02/2007  . HYPERLIPIDEMIA 08/02/2007  . INSOMNIA-SLEEP DISORDER-UNSPEC 01/04/2008  . INTERMITTENT VERTIGO 05/15/2009  . LIBIDO, DECREASED 01/09/2010    MEDICATIONS: Current Outpatient Medications on File Prior to Visit  Medication Sig Dispense Refill  . ACCU-CHEK FASTCLIX LANCETS MISC Use to check blood sugars twice times a day. Dx E11.9 102 each 3  . aspirin 81 MG EC tablet Take 81 mg by mouth daily.      . Blood Glucose Monitoring Suppl (ACCU-CHEK GUIDE) w/Device KIT 1 Device by Does not apply route 2 (two) times daily. Use to check blood sugars twice a day Dx. E11.9 1 kit 0  . butalbital-acetaminophen-caffeine (FIORICET, ESGIC) 50-325-40 MG tablet TAKE 1 TABLET BY MOUTH EVERY 6 HOURS AS NEEDED FOR HEADACHE *MAX OF 1 OR 2 TABLETS PER DAY* (Patient not taking: Reported on 06/28/2017) 30 tablet 0  . DENTA 5000 PLUS 1.1 % CREA dental cream   3  . divalproex (DEPAKOTE ER) 500 MG 24 hr tablet Take 1 tablet (500 mg total) by mouth at bedtime. 30 tablet 0  . esomeprazole (NEXIUM) 40 MG capsule Take 1 capsule (40 mg total) by mouth 2 (two) times daily before a meal. 180 capsule 3  . esomeprazole (NEXIUM) 40 MG capsule TAKE 1 CAPSULE BY MOUTH 2 TIMES DAILY BEFORE A MEAL. 180 capsule 0  . glucose blood (ACCU-CHEK GUIDE) test strip Use to check blood sugars twice a day Dx E11.9 100 each 3  . Insulin Glargine (LANTUS SOLOSTAR) 100 UNIT/ML Solostar Pen Inject 36 units in the morning. 11 pen 1  . Insulin Pen Needle (UNIFINE PENTIPS) 32G X 4 MM MISC USE 1x a day 100 each PRN  . meloxicam (MOBIC) 15 MG tablet Take 1 tablet (15 mg total) by mouth daily. 30 tablet 0  . metFORMIN (GLUCOPHAGE-XR) 500 MG 24 hr tablet TAKE 2 TABLETS BY MOUTH 2 TIMES DAILY WITH A MEAL. 120 tablet 5  . methocarbamol (ROBAXIN) 500 MG tablet Take 1 tablet (500 mg total) by mouth 2 (two) times daily as needed for muscle spasms. 30 tablet 2  . OnabotulinumtoxinA (BOTOX IJ) Inject 1 each as directed  every 3 (three) months.    . ondansetron (ZOFRAN) 4 MG tablet Take 1 tablet (4 mg total) by mouth every 8 (eight) hours as needed for nausea or vomiting. 20 tablet 1  . repaglinide (PRANDIN) 1 MG tablet Take 1 tablet (1 mg total) by mouth 3 (three) times daily before meals. 90 tablet 5  . rosuvastatin (CRESTOR) 40 MG tablet Take 1 tablet (40 mg total) by mouth daily. 90 tablet 3  . SUMAtriptan Succinate (ZEMBRACE SYMTOUCH) 3 MG/0.5ML SOAJ Inject 1 Device into the skin once. 1 pen 0  . SUMAtriptan Succinate (ZEMBRACE SYMTOUCH) 3 MG/0.5ML SOAJ Inject 1 Device into the skin once. May repeat dose once after 1 hour if headache persists or returns (do not exceed two injections in 24 hours) 9 pen 0  . traMADol (ULTRAM) 50 MG tablet TAKE 1 TABLET BY MOUTH EVERY 8 HOURS AS NEEDED 60 tablet 2  . traMADol (ULTRAM) 50 MG tablet TAKE 1 TABLET BY MOUTH EVERY 8 HOURS AS NEEDED 60 tablet 2   No current facility-administered medications on file prior to visit.     ALLERGIES: Allergies  Allergen Reactions  . Lipitor [Atorvastatin]     REACTION: myalgias  . Nitroglycerin     FAMILY HISTORY: Family History  Problem Relation Age of Onset  . Hypertension Father   . Diabetes Father   . Asthma Father   . Cervical cancer Maternal Aunt   . Heart disease Maternal Aunt   . Anesthesia problems Neg Hx   . Colon cancer Neg Hx     SOCIAL HISTORY: Social History   Socioeconomic History  . Marital status: Married    Spouse name: [name]  . Number of children: 3  . Years of education: Degree  . Highest education level: Not on file  Social Needs  . Financial resource strain: Not on file  . Food insecurity - worry: Not on file  . Food insecurity - inability: Not on file  . Transportation needs - medical: Not on file  . Transportation needs - non-medical: Not on file  Occupational History  . Occupation: Research scientist (life sciences): [employer]  . Occupation: Chartered certified accountant    Employer: [employer]    Tobacco Use  . Smoking status: Never Smoker  . Smokeless tobacco: Never Used  Substance and Sexual Activity  . Alcohol use: No    Alcohol/week: 0.0 oz  . Drug use: No  . Sexual activity: Yes  Other Topics Concern  . Not on file  Social History Narrative   She has 3 daughters.   Patient has a 4 year degree.    Patient working at [employer].    Patient is married to [place].    REVIEW OF SYSTEMS: Constitutional: No fevers, chills, or sweats, no generalized fatigue, change in appetite Eyes: No visual changes, double vision, eye pain Ear, nose and throat: No hearing loss, ear pain, nasal congestion, sore throat Cardiovascular: No chest pain, palpitations Respiratory:  No shortness of breath at rest or with exertion, wheezes GastrointestinaI: No nausea, vomiting, diarrhea, abdominal pain, fecal incontinence Genitourinary:  No dysuria, urinary retention or frequency Musculoskeletal:  Neck pain Integumentary: No rash, pruritus, skin lesions Neurological: as above Psychiatric: No depression, insomnia, anxiety Endocrine: No palpitations, fatigue, diaphoresis, mood swings, change in appetite, change in weight, increased thirst Hematologic/Lymphatic:  No purpura, petechiae. Allergic/Immunologic: no itchy/runny eyes, nasal congestion, recent allergic reactions, rashes  PHYSICAL EXAM: Vitals:   06/28/17 0742  BP: 102/74  Pulse: 98  SpO2: 97%   General: No acute distress.  Patient appears well-groomed.   Head:  Normocephalic/atraumatic Eyes:  Fundi examined but not visualized Neck: supple, mild paraspinal tenderness, full range of motion Heart:  Regular rate and rhythm Lungs:  Clear to auscultation bilaterally Back: No paraspinal tenderness Neurological Exam: alert and oriented to person, place, and time. Attention span and concentration intact, recent and remote memory intact, fund of knowledge intact.  Speech fluent and not dysarthric, language intact.  CN II-XII intact. Bulk and  tone normal, muscle strength 5/5 throughout.  Sensation to light touch  intact.  Deep tendon reflexes 2+ throughout.  Finger to nose testing intact.  Gait normal, Romberg negative.  IMPRESSION: Migraine.  No longer chronic with Botox but will try to decrease frequency further.  PLAN: 1.  Start atenolol 7m daily.  Advised to monitor for lightheadeness 2.  For abortive therapy, we will try Relpax 425m3.  Advised routine Exercise 4.  Reviewed sleep hygiene 5.  Follow up for next Botox in March 6.  Follow up for routine office visit in 6 months.  AdQuita Skye  [place], DO  CC:  Cathlean Cower, MD

## 2017-06-28 NOTE — Telephone Encounter (Signed)
LM for pt to call back to reschedule her appt with Dr. Cruzita Lederer

## 2017-06-29 NOTE — Progress Notes (Signed)
[name] [address] [place], [place] [address] Phone: [phone] Subjective:     CC: Neck and back pain  FTD:[id number]  Rachel [name] is a 52 y.o. female coming in with complaint of neck and back pain.  Was found to have a positive rheumatoid factor.  Sent to rheumatology.  Was being followed for that.  History of headaches followed by neurology as well.  Has attempted osteopathic manipulation.  Working on Engineer, building services.  Making some progress.  Patient states that she has good days and bad days. She has had some relief from exercises and tennis ball massage. She does still complain of getting headaches due to the cervical tightness.      Past Medical History:  Diagnosis Date  . ALLERGIC RHINITIS 10/04/2007  . Anemia 01/21/2011  . ASTHMA 08/02/2007   INHALERS ONLY IN [place]  . Blood transfusion without reported diagnosis    with first child   . COMMON MIGRAINE 05/15/2009  . DIABETES MELLITUS, TYPE II 08/02/2007  . GERD 08/02/2007  . HYPERLIPIDEMIA 08/02/2007  . INSOMNIA-SLEEP DISORDER-UNSPEC 01/04/2008  . INTERMITTENT VERTIGO 05/15/2009  . LIBIDO, DECREASED 01/09/2010   Past Surgical History:  Procedure Laterality Date  . CESAREAN SECTION     x 3  . CYST EXCISION     left knee  . PAROTIDECTOMY  10/21/2011   Procedure: PAROTIDECTOMY;  Surgeon: Melida Quitter, MD;  Location: [facility] OR;  Service: ENT;  Laterality: Left;   Social History   Socioeconomic History  . Marital status: Married    Spouse name: [name]  . Number of children: 3  . Years of education: Degree  . Highest education level: Not on file  Social Needs  . Financial resource strain: Not on file  . Food insecurity - worry: Not on file  . Food insecurity - inability: Not on file  . Transportation needs - medical: Not on file  . Transportation needs - non-medical: Not on file  Occupational History  . Occupation: Research scientist (life sciences): [employer]  . Occupation: Fish farm manager    Employer: [employer]  Tobacco Use  . Smoking status: Never Smoker  . Smokeless tobacco: Never Used  Substance and Sexual Activity  . Alcohol use: No    Alcohol/week: 0.0 oz  . Drug use: No  . Sexual activity: Yes  Other Topics Concern  . Not on file  Social History Narrative   She has 3 daughters.   Patient has a 4 year degree.    Patient working at [employer].    Patient is married to [place].   Allergies  Allergen Reactions  . Lipitor [Atorvastatin]     REACTION: myalgias  . Nitroglycerin    Family History  Problem Relation Age of Onset  . Hypertension Father   . Diabetes Father   . Asthma Father   . Cervical cancer Maternal Aunt   . Heart disease Maternal Aunt   . Anesthesia problems Neg Hx   . Colon cancer Neg Hx      Past medical history, social, surgical and family history all reviewed in electronic medical record.  No pertanent information unless stated regarding to the chief complaint.   Review of Systems:Review of systems updated and as accurate as of 06/30/17  No  visual changes, nausea, vomiting, diarrhea, constipation, dizziness, abdominal pain, skin rash, fevers, chills, night sweats, weight loss, swollen lymph nodes, body aches, joint swelling,  chest pain, shortness of  breath, mood changes.  Positive muscle aches and headaches  Objective  Blood pressure 112/82, pulse 100, height 5' (1.524 m), weight 147 lb (66.7 kg), SpO2 98 %. Systems examined below as of 06/30/17   General: No apparent distress alert and oriented x3 mood and affect normal, dressed appropriately.  HEENT: Pupils equal, extraocular movements intact  Respiratory: Patient's speak in full sentences and does not appear short of breath  Cardiovascular: No lower extremity edema, non tender, no erythema  Skin: Warm dry intact with no signs of infection or rash on extremities or on axial skeleton.  Abdomen: Soft nontender  Neuro: Cranial nerves II through XII are intact, neurovascularly  intact in all extremities with 2+ DTRs and 2+ pulses.  Lymph: No lymphadenopathy of posterior or anterior cervical chain or axillae bilaterally.  Gait normal with good balance and coordination.  MSK:  Non tender with full range of motion and good stability and symmetric strength and tone of shoulders, elbows, wrist, hip, knee and ankles bilaterally.  Neck: Inspection loss of lordosis. No palpable stepoffs. Negative Spurling's maneuver. Mild loss of range of motion lacking the last 5 degrees of sidebending to the right and rotation Grip strength and sensation normal in bilateral hands Strength good C4 to T1 distribution No sensory change to C4 to T1 Negative Hoffman sign bilaterally Reflexes normal Tightness of right trapezius.  Osteopathic findings C2 flexed rotated and side bent right C4 flexed rotated and side bent left C7 flexed rotated and side bent right T3 extended rotated and side bent right inhaled third rib T6 extended rotated and side bent left L2 flexed rotated and side bent right Sacrum right on right     Impression and Recommendations:     This case required medical decision making of moderate complexity.

## 2017-06-30 ENCOUNTER — Ambulatory Visit (INDEPENDENT_AMBULATORY_CARE_PROVIDER_SITE_OTHER): Payer: 59 | Admitting: Family Medicine

## 2017-06-30 VITALS — BP 112/82 | HR 100 | Ht 60.0 in | Wt 147.0 lb

## 2017-06-30 DIAGNOSIS — M999 Biomechanical lesion, unspecified: Secondary | ICD-10-CM

## 2017-06-30 DIAGNOSIS — R51 Headache: Secondary | ICD-10-CM | POA: Diagnosis not present

## 2017-06-30 DIAGNOSIS — G4486 Cervicogenic headache: Secondary | ICD-10-CM

## 2017-06-30 MED ORDER — DIAZEPAM 5 MG PO TABS: 5.0000 mg | ORAL_TABLET | Freq: Two times a day (BID) | ORAL | 0 refills | Status: DC | PRN

## 2017-06-30 NOTE — Patient Instructions (Addendum)
Good to se eyou  Keep working on the posture Stay active Avoid too much lifting overhead See me again in 5-6 weeks [phone]

## 2017-06-30 NOTE — Assessment & Plan Note (Signed)
Overall patient seems to be making progress.  He does respond very well to osteopathic manipulation and had pain decreased immediately.  We discussed icing regimen, home exercises, which activities to do which wants to avoid.  Patient will slowly increase activity.  Follow-up with me again 4-6 weeks

## 2017-06-30 NOTE — Progress Notes (Signed)
Rcvd [facility]ed [facility]st [facility]let[facility]an, indicating Pt must have tried and failed sumatriptan and rizatriptan. Called the number provided, wait time was extremely long, initiated online on cover my meds.

## 2017-06-30 NOTE — Assessment & Plan Note (Signed)
Decision today to treat with OMT was based on Physical Exam  After verbal consent patient was treated with HVLA, ME, FPR techniques in cervical, thoracic, lumbar and sacral areas  Patient tolerated the procedure well with improvement in symptoms  Patient given exercises, stretches and lifestyle modifications  See medications in patient instructions if given  Patient will follow up in 4-6 weeks 

## 2017-07-03 ENCOUNTER — Telehealth: Payer: 59 | Admitting: Family

## 2017-07-03 DIAGNOSIS — J0191 Acute recurrent sinusitis, unspecified: Secondary | ICD-10-CM

## 2017-07-03 MED ORDER — AMOXICILLIN-POT CLAVULANATE 875-125 MG PO TABS: 1.0000 | ORAL_TABLET | Freq: Two times a day (BID) | ORAL | 0 refills | Status: DC

## 2017-07-03 NOTE — Progress Notes (Signed)

## 2017-07-05 ENCOUNTER — Encounter: Payer: Self-pay | Admitting: Family

## 2017-07-05 ENCOUNTER — Ambulatory Visit (INDEPENDENT_AMBULATORY_CARE_PROVIDER_SITE_OTHER): Payer: 59 | Admitting: Family

## 2017-07-05 ENCOUNTER — Telehealth: Payer: Self-pay | Admitting: Neurology

## 2017-07-05 VITALS — BP 118/82 | HR 105 | Temp 98.5°F | Resp 16 | Ht 60.0 in | Wt 147.8 lb

## 2017-07-05 DIAGNOSIS — R519 Headache, unspecified: Secondary | ICD-10-CM

## 2017-07-05 DIAGNOSIS — J019 Acute sinusitis, unspecified: Secondary | ICD-10-CM | POA: Diagnosis not present

## 2017-07-05 DIAGNOSIS — R51 Headache: Secondary | ICD-10-CM

## 2017-07-05 MED ORDER — FLUTICASONE PROPIONATE 50 MCG/ACT NA SUSP: 2.0000 | Freq: Two times a day (BID) | NASAL | 0 refills | Status: DC

## 2017-07-05 MED ORDER — KETOROLAC TROMETHAMINE 30 MG/ML IJ SOLN
30.0000 mg | Freq: Once | INTRAMUSCULAR | Status: AC
  Administered 2017-07-05: 30 mg via INTRAMUSCULAR

## 2017-07-05 MED ORDER — DOXYCYCLINE HYCLATE 100 MG PO TABS: 100.0000 mg | ORAL_TABLET | Freq: Two times a day (BID) | ORAL | 0 refills | Status: DC

## 2017-07-05 NOTE — Telephone Encounter (Signed)
Called and spoke with Pt, advsd her of Dr Georgie Chard recommendations: 1) Sprix 2) Reglan 3) thorazine  After discussing each, Pt wants to try Sprix. Sending in form

## 2017-07-05 NOTE — Progress Notes (Signed)
Rcvd approval for eletriptan 40mg  for 12 refills 06/30/17-06/29/2018

## 2017-07-05 NOTE — Telephone Encounter (Signed)
Patient needs for [place] to call her in something different the medication that DR Tomi Likens called is 285.00 per month and she cant afford that

## 2017-07-05 NOTE — Progress Notes (Signed)
Called  [facility] pharm., LM on VM advising of eletriptan approval

## 2017-07-05 NOTE — Progress Notes (Signed)
Rachel [name] is a 52 y.o. female with the following history as recorded in EpicCare:  Patient Active Problem List   Diagnosis Date Noted  . Acute sinus infection 05/11/2017  . Orthostasis 05/11/2017  . Cervicogenic headache 04/27/2017  . Nonallopathic lesion of cervical region 04/27/2017  . Rheumatoid factor positive 02/10/2016  . Myalgia and myositis 01/08/2016  . Low back pain 12/23/2015  . Left shoulder pain 12/23/2015  . Nonallopathic lesion of lumbosacral region 07/29/2015  . Nonallopathic lesion of sacral region 07/29/2015  . Nonallopathic lesion of thoracic region 07/29/2015  . Loss of weight 06/12/2014  . Dysphagia, pharyngoesophageal phase 06/12/2014  . Headache(784.0) 12/18/2012  . Recurrent falls 06/16/2012  . Chest pain 06/16/2012  . Back pain 03/06/2012  . Syncope and collapse 06/18/2011  . Fatigue 01/21/2011  . Microcytic anemia 01/21/2011  . Cervical radiculopathy 01/21/2011  . Encounter for well adult exam with abnormal findings 10/11/2010  . Depression 01/09/2010  . LIBIDO, DECREASED 01/09/2010  . Migraine without aura 05/15/2009  . INTERMITTENT VERTIGO 05/15/2009  . INSOMNIA-SLEEP DISORDER-UNSPEC 01/04/2008  . Allergic rhinitis 10/04/2007  . Type 2 diabetes mellitus with hyperglycemia, with long-term current use of insulin ([place]) 08/02/2007  . Hyperlipidemia 08/02/2007  . Asthma 08/02/2007  . GERD 08/02/2007    Current Outpatient Medications  Medication Sig Dispense Refill  . ACCU-CHEK FASTCLIX LANCETS MISC USE TO CHECK BLOOD SUGARS TWICE TIMES A DAY 102 each 3  . ACCU-CHEK GUIDE test strip USE TO CHECK BLOOD SUGARS TWICE A DAY DX E11.9 100 each 3  . amoxicillin-clavulanate (AUGMENTIN) 875-125 MG tablet Take 1 tablet by mouth 2 (two) times daily. 14 tablet 0  . aspirin 81 MG EC tablet Take 81 mg by mouth daily.      Marland Kitchen atenolol (TENORMIN) 25 MG tablet Take 1 tablet (25 mg total) by mouth daily. 30 tablet 2  . Blood Glucose Monitoring Suppl (ACCU-CHEK GUIDE)  w/Device KIT 1 Device by Does not apply route 2 (two) times daily. Use to check blood sugars twice a day Dx. E11.9 1 kit 0  . butalbital-acetaminophen-caffeine (FIORICET, ESGIC) 50-325-40 MG tablet TAKE 1 TABLET BY MOUTH EVERY 6 HOURS AS NEEDED FOR HEADACHE *MAX OF 1 OR 2 TABLETS PER DAY* 30 tablet 0  . DENTA 5000 PLUS 1.1 % CREA dental cream   3  . diazepam (VALIUM) 5 MG tablet Take 1 tablet (5 mg total) by mouth every 12 (twelve) hours as needed. 10 tablet 0  . divalproex (DEPAKOTE ER) 500 MG 24 hr tablet Take 1 tablet (500 mg total) by mouth at bedtime. 30 tablet 0  . eletriptan (RELPAX) 40 MG tablet Take 1 tablet earliest onset of headache.  May repeat once in 2 hours if headache persists or recurs. 10 tablet 2  . esomeprazole (NEXIUM) 40 MG capsule Take 1 capsule (40 mg total) by mouth 2 (two) times daily before a meal. 180 capsule 3  . esomeprazole (NEXIUM) 40 MG capsule TAKE 1 CAPSULE BY MOUTH 2 TIMES DAILY BEFORE A MEAL. 180 capsule 0  . Insulin Glargine (LANTUS SOLOSTAR) 100 UNIT/ML Solostar Pen Inject 36 units in the morning. 11 pen 1  . Insulin Pen Needle (UNIFINE PENTIPS) 32G X 4 MM MISC USE 1x a day 100 each PRN  . JANUVIA 100 MG tablet TAKE 1 TABLET (100 MG TOTAL) BY MOUTH DAILY. 90 tablet 1  . meloxicam (MOBIC) 15 MG tablet Take 1 tablet (15 mg total) by mouth daily. 30 tablet 0  . metFORMIN (GLUCOPHAGE-XR) 500  MG 24 hr tablet TAKE 2 TABLETS BY MOUTH 2 TIMES DAILY WITH A MEAL. 120 tablet 5  . methocarbamol (ROBAXIN) 500 MG tablet Take 1 tablet (500 mg total) by mouth 2 (two) times daily as needed for muscle spasms. 30 tablet 2  . OnabotulinumtoxinA (BOTOX IJ) Inject 1 each as directed every 3 (three) months.    . ondansetron (ZOFRAN) 4 MG tablet Take 1 tablet (4 mg total) by mouth every 8 (eight) hours as needed for nausea or vomiting. 20 tablet 1  . repaglinide (PRANDIN) 1 MG tablet Take 1 tablet (1 mg total) by mouth 3 (three) times daily before meals. 90 tablet 5  . rosuvastatin  (CRESTOR) 40 MG tablet Take 1 tablet (40 mg total) by mouth daily. 90 tablet 3  . traMADol (ULTRAM) 50 MG tablet TAKE 1 TABLET BY MOUTH EVERY 8 HOURS AS NEEDED 60 tablet 2  . traMADol (ULTRAM) 50 MG tablet TAKE 1 TABLET BY MOUTH EVERY 8 HOURS AS NEEDED 60 tablet 2  . doxycycline (VIBRA-TABS) 100 MG tablet Take 1 tablet (100 mg total) by mouth 2 (two) times daily. 20 tablet 0  . fluticasone (FLONASE) 50 MCG/ACT nasal spray Place 2 sprays into both nostrils 2 (two) times daily. 16 g 0  . SUMAtriptan Succinate (ZEMBRACE SYMTOUCH) 3 MG/0.5ML SOAJ Inject 1 Device into the skin once. 1 pen 0  . SUMAtriptan Succinate (ZEMBRACE SYMTOUCH) 3 MG/0.5ML SOAJ Inject 1 Device into the skin once. May repeat dose once after 1 hour if headache persists or returns (do not exceed two injections in 24 hours) 9 pen 0   Current Facility-Administered Medications  Medication Dose Route Frequency Provider Last Rate Last Dose  . ketorolac (TORADOL) 30 MG/ML injection 30 mg  30 mg Intramuscular Once Marrian Salvage, FNP        Allergies: Lipitor [atorvastatin] and Nitroglycerin  Past Medical History:  Diagnosis Date  . ALLERGIC RHINITIS 10/04/2007  . Anemia 01/21/2011  . ASTHMA 08/02/2007   INHALERS ONLY IN [place]  . Blood transfusion without reported diagnosis    with first child   . COMMON MIGRAINE 05/15/2009  . DIABETES MELLITUS, TYPE II 08/02/2007  . GERD 08/02/2007  . HYPERLIPIDEMIA 08/02/2007  . INSOMNIA-SLEEP DISORDER-UNSPEC 01/04/2008  . INTERMITTENT VERTIGO 05/15/2009  . LIBIDO, DECREASED 01/09/2010    Past Surgical History:  Procedure Laterality Date  . CESAREAN SECTION     x 3  . CYST EXCISION     left knee  . PAROTIDECTOMY  10/21/2011   Procedure: PAROTIDECTOMY;  Surgeon: Melida Quitter, MD;  Location: [facility];  Service: ENT;  Laterality: Left;    Family History  Problem Relation Age of Onset  . Hypertension Father   . Diabetes Father   . Asthma Father   . Cervical cancer Maternal Aunt   . Heart  disease Maternal Aunt   . Anesthesia problems Neg Hx   . Colon cancer Neg Hx     Social History   Tobacco Use  . Smoking status: Never Smoker  . Smokeless tobacco: Never Used  Substance Use Topics  . Alcohol use: No    Alcohol/week: 0.0 oz    Subjective:  Patient presents with concerns for sinus infection; 4 days into Augmentin prescription and feels she has not gotten any relief;; complaining of persisting congestion, nasal "burning"; no fever; no chest pain or shortness of breath; has tried OTC Claritin with no benefit; notes that headache is "very bad" in the past 2 days; does have history  of migraine headaches; requesting work note for this week- job involves working on a computer and headache is aggravated by looking at screen.   Objective:  Vitals:   07/05/17 1503  BP: 118/82  Pulse: (!) 105  Resp: 16  Temp: 98.5 F (36.9 C)  TempSrc: Oral  SpO2: 98%  Weight: 147 lb 12.8 oz (67 kg)  Height: 5' (1.524 m)    General: Well developed, well nourished, in no acute distress  Skin : Warm and dry.  Head: Normocephalic and atraumatic  Eyes: Sclera and conjunctiva clear; pupils round and reactive to light; extraocular movements intact  Ears: External normal; canals clear; tympanic membranes congested bilaterally Oropharynx: Pink, supple. No suspicious lesions  Neck: Supple without thyromegaly, adenopathy  Lungs: Respirations unlabored; clear to auscultation bilaterally without wheeze, rales, rhonchi  CVS exam: normal rate, regular rhythm, normal S1, S2, no murmurs, rubs, clicks or Neurologic: Alert and oriented; speech intact; face symmetrical; moves all extremities well; CNII-XII intact without focal deficit  Assessment:  1. Intractable headache, unspecified chronicity pattern, unspecified headache type   2. Acute sinusitis, recurrence not specified, unspecified location     Plan:  Suspect secondary migraine due to underlying sinus infection; Toradol IM 30 mg given in office  today to help break migraine cycle; do not feel comfortable using oral steroids at this time due to underlying diabetes; D/C Augmentin- change to Doxycycline 100 mg bid x 10 days; Rx for Flonase 2 sprays per nostril bid; increase fluids, rest and follow-up worse, no better.  Work note given for the remainder of the week as requested;   No Follow-up on file.  No orders of the defined types were placed in this encounter.   Requested Prescriptions   Signed Prescriptions Disp Refills  . doxycycline (VIBRA-TABS) 100 MG tablet 20 tablet 0    Sig: Take 1 tablet (100 mg total) by mouth 2 (two) times daily.  . fluticasone (FLONASE) 50 MCG/ACT nasal spray 16 g 0    Sig: Place 2 sprays into both nostrils 2 (two) times daily.

## 2017-07-05 NOTE — Patient Instructions (Signed)
Please discontinue Augmentin- change to Doxycycline 100 mg twice a day x 10 days; use the Flonase twice a day x 10 days as while.  I think you have a secondary migraine due to the sinus infection.

## 2017-07-20 DIAGNOSIS — I83813 Varicose veins of bilateral lower extremities with pain: Secondary | ICD-10-CM | POA: Diagnosis not present

## 2017-07-26 ENCOUNTER — Telehealth: Payer: 59 | Admitting: Family

## 2017-07-26 DIAGNOSIS — N898 Other specified noninflammatory disorders of vagina: Secondary | ICD-10-CM

## 2017-07-26 NOTE — Progress Notes (Signed)
Based on what you shared with me it looks like you have a condition that should be evaluated in a face to face office visit.  NOTE: If you entered your credit card information for this eVisit, you will not be charged. You may see a "hold" on your card for the $30 but that hold will drop off and you will not have a charge processed.  If you are having a true medical emergency please call 911.  If you need an urgent face to face visit, [facility] has four urgent care centers for your convenience.  If you need care fast and have a high deductible or no insurance consider:   [email or web address] to reserve your spot online an avoid wait times  [facility] [address], [address] [place], [place] [address] 8 am to 8 pm Monday-Friday 10 am to 4 pm Saturday-Sunday *Across the street from [employer]  [facility]  [address] [place], [address] 8 am to 5 pm Monday-Friday * In the [facility]   The following sites will take your  insurance:  . [facility]  [phone] Get Driving Directions Find a Provider at this Location  [address] [place], [place] [address] . 10 am to 8 pm Monday-Friday . 12 pm to 8 pm Saturday-Sunday   . [facility]  [phone] Get Driving Directions Find a Provider at this Location  [address], [address] [place], [place] [address] . 8 am to 8 pm Monday-Friday . 9 am to 6 pm Saturday . 11 am to 6 pm Sunday   . [facility] [place]  [phone] Get Driving Directions  [address] [place], [place] [address] . 8 am to 8 pm Monday-Friday . 8 am to 4 pm Saturday-Sunday   Your e-visit answers were reviewed by a board certified advanced clinical practitioner to complete your personal care plan.  Thank you for using e-Visits. 

## 2017-07-27 ENCOUNTER — Other Ambulatory Visit: Payer: Self-pay | Admitting: Internal Medicine

## 2017-08-01 NOTE — Progress Notes (Signed)
[name] [place], [place] [address] Phone: [phone] Subjective:      CC: Back and neck pain follow-up  TOI:[id number]  Rachel [name] is a 52 y.o. female coming in with complaint of back and neck pain follow-up.  Found to have cervicogenic headaches.  Patient has been doing relatively well with osteopathic manipulation.  We discussed icing regimen, home exercises, which activities to do which wants to avoid.  Patient was to start increasing activity and work on Personal assistant.  Patient states that her lower back is tight today. She has been having some cervical spine tightness as well and she was unable to rotate her neck a couple of weeks ago. She is having a headache today.       Past Medical History:  Diagnosis Date  . ALLERGIC RHINITIS 10/04/2007  . Anemia 01/21/2011  . ASTHMA 08/02/2007   INHALERS ONLY IN [place]  . Blood transfusion without reported diagnosis    with first child   . COMMON MIGRAINE 05/15/2009  . DIABETES MELLITUS, TYPE II 08/02/2007  . GERD 08/02/2007  . HYPERLIPIDEMIA 08/02/2007  . INSOMNIA-SLEEP DISORDER-UNSPEC 01/04/2008  . INTERMITTENT VERTIGO 05/15/2009  . LIBIDO, DECREASED 01/09/2010   Past Surgical History:  Procedure Laterality Date  . CESAREAN SECTION     x 3  . CYST EXCISION     left knee  . PAROTIDECTOMY  10/21/2011   Procedure: PAROTIDECTOMY;  Surgeon: Melida Quitter, MD;  Location: [facility] OR;  Service: ENT;  Laterality: Left;   Social History   Socioeconomic History  . Marital status: Married    Spouse name: Rachel [name]  . Number of children: 3  . Years of education: Degree  . Highest education level: None  Social Needs  . Financial resource strain: None  . Food insecurity - worry: None  . Food insecurity - inability: None  . Transportation needs - medical: None  . Transportation needs - non-medical: None  Occupational History  . 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 Occupation: Fish farm manager    Employer: [employer]  Tobacco Use  . Smoking status: Never Smoker  . Smokeless tobacco: Never Used  Substance and Sexual Activity  . Alcohol use: No    Alcohol/week: 0.0 oz  . Drug use: No  . Sexual activity: Yes  Other Topics Concern  . None  Social History Narrative   She has 3 daughters.   Patient has a 4 year degree.    Patient working at [employer].    Patient is married to Rachel [name].   Allergies  Allergen Reactions  . Lipitor [Atorvastatin]     REACTION: myalgias  . Nitroglycerin    Family History  Problem Relation Age of Onset  . Hypertension Father   . Diabetes Father   . Asthma Father   . Cervical cancer Maternal Aunt   . Heart disease Maternal Aunt   . Anesthesia problems Neg Hx   . Colon cancer Neg Hx      Past medical history, social, surgical and family history all reviewed in electronic medical record.  No pertanent information unless stated regarding to the chief complaint.   Review of Systems:Review of systems updated and as accurate as of 08/02/17  No headache, visual changes, nausea, vomiting, diarrhea, constipation, dizziness, abdominal pain, skin rash, fevers, chills, night sweats, weight loss, swollen lymph nodes, body aches, joint swelling, muscle aches, chest pain, shortness of breath, mood changes.  Objective  Blood pressure 104/76, pulse 85, height 5\' 1"  (1.549 m), weight 145 lb (65.8 kg), SpO2 98 %. Systems examined below as of 08/02/17   General: No apparent distress alert and oriented x3 mood and affect normal, dressed appropriately.  HEENT: Pupils equal, extraocular movements intact  Respiratory: Patient's speak in full sentences and does not appear short of breath  Cardiovascular: No lower extremity edema, non tender, no erythema  Skin: Warm dry intact with no signs of infection or rash on extremities or on axial skeleton.  Abdomen: Soft nontender  Neuro: Cranial nerves II through XII are intact, neurovascularly intact in all  extremities with 2+ DTRs and 2+ pulses.  Lymph: No lymphadenopathy of posterior or anterior cervical chain or axillae bilaterally.  Gait normal with good balance and coordination.  MSK:  Non tender with full range of motion and good stability and symmetric strength and tone of shoulders, elbows, wrist, hip, knee and ankles bilaterally.  Neck: Inspection loss of lordosis. No palpable stepoffs. Negative Spurling's maneuver. Mild limitation in sidebending and rotation by 5-10 degrees. Grip strength and sensation normal in bilateral hands Strength good C4 to T1 distribution No sensory change to C4 to T1 Negative Hoffman sign bilaterally Reflexes normal Positive tightness of the right trapezius.  Osteopathic findings C2 flexed rotated and side bent right C4 flexed rotated and side bent left C7 flexed rotated and side bent left T3 extended rotated and side bent right inhaled third rib T6 extended rotated and side bent left L1 flexed rotated and side bent right Sacrum right on right     Impression and Recommendations:     This case required medical decision making of moderate complexity.      Note: This dictation was prepared with Dragon dictation along with smaller phrase technology. Any transcriptional errors that result from this process are unintentional.

## 2017-08-02 ENCOUNTER — Encounter: Payer: Self-pay | Admitting: Family Medicine

## 2017-08-02 ENCOUNTER — Ambulatory Visit (INDEPENDENT_AMBULATORY_CARE_PROVIDER_SITE_OTHER): Payer: 59 | Admitting: Family Medicine

## 2017-08-02 VITALS — BP 104/76 | HR 85 | Ht 61.0 in | Wt 145.0 lb

## 2017-08-02 DIAGNOSIS — M5412 Radiculopathy, cervical region: Secondary | ICD-10-CM

## 2017-08-02 DIAGNOSIS — M999 Biomechanical lesion, unspecified: Secondary | ICD-10-CM | POA: Diagnosis not present

## 2017-08-02 NOTE — Assessment & Plan Note (Signed)
Patient does have cervical radiculopathy.  No radiation, icing regimen.  Patient has been feeling well.  He is currently doing more of a manual labor.  I think that this could be contributing some of the back pain and headaches.  Patient does have some underlying stress.  Continue medications.  Follow-up again in 4-6 weeks

## 2017-08-02 NOTE — Patient Instructions (Signed)
Good to see you  Rachel [name] is your friend.  pennsaid pinkie amount topically 2 times daily as needed.  Continue the medication for now New stretches for the back  Spenco orthotics "total support" online would be great  See me again in 4-6 weeks

## 2017-08-02 NOTE — Assessment & Plan Note (Signed)
Decision today to treat with OMT was based on Physical Exam  After verbal consent patient was treated with HVLA, ME, FPR techniques in cervical, thoracic, lumbar and sacral areas  Patient tolerated the procedure well with improvement in symptoms  Patient given exercises, stretches and lifestyle modifications  See medications in patient instructions if given  Patient will follow up in 4-6 weeks 

## 2017-08-03 ENCOUNTER — Other Ambulatory Visit: Payer: Self-pay | Admitting: Internal Medicine

## 2017-08-03 NOTE — Telephone Encounter (Signed)
Done erx 

## 2017-08-11 DIAGNOSIS — H5213 Myopia, bilateral: Secondary | ICD-10-CM | POA: Diagnosis not present

## 2017-08-11 DIAGNOSIS — H524 Presbyopia: Secondary | ICD-10-CM | POA: Diagnosis not present

## 2017-08-12 ENCOUNTER — Encounter: Payer: Self-pay | Admitting: Internal Medicine

## 2017-08-12 ENCOUNTER — Ambulatory Visit (INDEPENDENT_AMBULATORY_CARE_PROVIDER_SITE_OTHER): Payer: 59 | Admitting: Internal Medicine

## 2017-08-12 VITALS — BP 112/78 | HR 106 | Temp 98.4°F | Ht 61.0 in | Wt 146.0 lb

## 2017-08-12 DIAGNOSIS — Z794 Long term (current) use of insulin: Secondary | ICD-10-CM | POA: Diagnosis not present

## 2017-08-12 DIAGNOSIS — Z0000 Encounter for general adult medical examination without abnormal findings: Secondary | ICD-10-CM | POA: Diagnosis not present

## 2017-08-12 DIAGNOSIS — E1165 Type 2 diabetes mellitus with hyperglycemia: Secondary | ICD-10-CM | POA: Diagnosis not present

## 2017-08-12 NOTE — Progress Notes (Signed)
Subjective:    Patient ID: Rachel [name], female    DOB: 1965-11-22, 52 y.o.   MRN: [id number]  HPI  Here for wellness and f/u;  Overall doing ok;  Pt denies Chest pain, worsening SOB, DOE, wheezing, orthopnea, PND, worsening LE edema, palpitations, dizziness or syncope.  Pt denies neurological change such as new headache, facial or extremity weakness.  Pt denies polydipsia, polyuria, or low sugar symptoms. Pt states overall good compliance with treatment and medications, good tolerability, and has been trying to follow appropriate diet.  Pt denies worsening depressive symptoms, suicidal ideation or panic. No fever, night sweats, wt loss, loss of appetite, or other constitutional symptoms.  Pt states good ability with ADL's, has low fall risk, home safety reviewed and adequate, no other significant changes in hearing or vision, and only occasionally active with exercise.  No other interval change or new complaints Past Medical History:  Diagnosis Date  . ALLERGIC RHINITIS 10/04/2007  . Anemia 01/21/2011  . ASTHMA 08/02/2007   INHALERS ONLY IN [place]  . Blood transfusion without reported diagnosis    with first child   . COMMON MIGRAINE 05/15/2009  . DIABETES MELLITUS, TYPE II 08/02/2007  . GERD 08/02/2007  . HYPERLIPIDEMIA 08/02/2007  . INSOMNIA-SLEEP DISORDER-UNSPEC 01/04/2008  . INTERMITTENT VERTIGO 05/15/2009  . LIBIDO, DECREASED 01/09/2010   Past Surgical History:  Procedure Laterality Date  . CESAREAN SECTION     x 3  . CYST EXCISION     left knee  . PAROTIDECTOMY  10/21/2011   Procedure: PAROTIDECTOMY;  Surgeon: Melida Quitter, MD;  Location: [facility];  Service: ENT;  Laterality: Left;    reports that  has never smoked. she has never used smokeless tobacco. She reports that she does not drink alcohol or use drugs. family history includes Asthma in her father; Cervical cancer in her maternal aunt; Diabetes in her father; Heart disease in her maternal aunt; Hypertension in her father. Allergies    Allergen Reactions  . Lipitor [Atorvastatin]     REACTION: myalgias  . Nitroglycerin    Current Outpatient Medications on File Prior to Visit  Medication Sig Dispense Refill  . ACCU-CHEK FASTCLIX LANCETS MISC USE TO CHECK BLOOD SUGARS TWICE TIMES A DAY 102 each 3  . ACCU-CHEK GUIDE test strip USE TO CHECK BLOOD SUGARS TWICE A DAY DX E11.9 100 each 3  . aspirin 81 MG EC tablet Take 81 mg by mouth daily.      Marland Kitchen atenolol (TENORMIN) 25 MG tablet Take 1 tablet (25 mg total) by mouth daily. 30 tablet 2  . Blood Glucose Monitoring Suppl (ACCU-CHEK GUIDE) w/Device KIT 1 Device by Does not apply route 2 (two) times daily. Use to check blood sugars twice a day Dx. E11.9 1 kit 0  . butalbital-acetaminophen-caffeine (FIORICET, ESGIC) 50-325-40 MG tablet TAKE 1 TABLET BY MOUTH EVERY 6 HOURS AS NEEDED FOR HEADACHE *MAX OF 1 OR 2 TABLETS PER DAY* 30 tablet 0  . DENTA 5000 PLUS 1.1 % CREA dental cream   3  . diazepam (VALIUM) 5 MG tablet Take 1 tablet (5 mg total) by mouth every 12 (twelve) hours as needed. 10 tablet 0  . divalproex (DEPAKOTE ER) 500 MG 24 hr tablet Take 1 tablet (500 mg total) by mouth at bedtime. 30 tablet 0  . eletriptan (RELPAX) 40 MG tablet Take 1 tablet earliest onset of headache.  May repeat once in 2 hours if headache persists or recurs. 10 tablet 2  . esomeprazole (NEXIUM) 40  MG capsule TAKE 1 CAPSULE BY MOUTH 2 TIMES DAILY BEFORE A MEAL. 180 capsule 0  . fluticasone (FLONASE) 50 MCG/ACT nasal spray Place 2 sprays into both nostrils 2 (two) times daily. 16 g 0  . gabapentin (NEURONTIN) 100 MG capsule TAKE 2 CAPSULES (200 MG TOTAL) BY MOUTH AT BEDTIME. 60 capsule 3  . Insulin Glargine (LANTUS SOLOSTAR) 100 UNIT/ML Solostar Pen Inject 36 units in the morning. 11 pen 1  . Insulin Pen Needle (UNIFINE PENTIPS) 32G X 4 MM MISC USE 1x a day 100 each PRN  . JANUVIA 100 MG tablet TAKE 1 TABLET (100 MG TOTAL) BY MOUTH DAILY. 90 tablet 1  . meloxicam (MOBIC) 15 MG tablet Take 1 tablet (15 mg  total) by mouth daily. 30 tablet 0  . metFORMIN (GLUCOPHAGE-XR) 500 MG 24 hr tablet TAKE 2 TABLETS BY MOUTH 2 TIMES DAILY WITH A MEAL. 120 tablet 5  . methocarbamol (ROBAXIN) 500 MG tablet Take 1 tablet (500 mg total) by mouth 2 (two) times daily as needed for muscle spasms. 30 tablet 2  . OnabotulinumtoxinA (BOTOX IJ) Inject 1 each as directed every 3 (three) months.    . repaglinide (PRANDIN) 1 MG tablet Take 1 tablet (1 mg total) by mouth 3 (three) times daily before meals. 90 tablet 5  . rosuvastatin (CRESTOR) 40 MG tablet Take 1 tablet (40 mg total) by mouth daily. 90 tablet 3  . traMADol (ULTRAM) 50 MG tablet TAKE 1 TABLET BY MOUTH EVERY 8 HOURS AS NEEDED 60 tablet 2  . SUMAtriptan Succinate (ZEMBRACE SYMTOUCH) 3 MG/0.5ML SOAJ Inject 1 Device into the skin once. May repeat dose once after 1 hour if headache persists or returns (do not exceed two injections in 24 hours) 9 pen 0   No current facility-administered medications on file prior to visit.    Review of Systems Constitutional: Negative for other unusual diaphoresis, sweats, appetite or weight changes HENT: Negative for other worsening hearing loss, ear pain, facial swelling, mouth sores or neck stiffness.   Eyes: Negative for other worsening pain, redness or other visual disturbance.  Respiratory: Negative for other stridor or swelling Cardiovascular: Negative for other palpitations or other chest pain  Gastrointestinal: Negative for worsening diarrhea or loose stools, blood in stool, distention or other pain Genitourinary: Negative for hematuria, flank pain or other change in urine volume.  Musculoskeletal: Negative for myalgias or other joint swelling.  Skin: Negative for other color change, or other wound or worsening drainage.  Neurological: Negative for other syncope or numbness. Hematological: Negative for other adenopathy or swelling Psychiatric/Behavioral: Negative for hallucinations, other worsening agitation, SI,  self-injury, or new decreased concentration All other system neg per pt    Objective:   Physical Exam BP 112/78   Pulse (!) 106   Temp 98.4 F (36.9 C) (Oral)   Ht '5\' 1"'$  (1.549 m)   Wt 146 lb (66.2 kg)   SpO2 98%   BMI 27.59 kg/m  VS noted,  Constitutional: Pt is oriented to person, place, and time. Appears well-developed and well-nourished, in no significant distress and comfortable Head: Normocephalic and atraumatic  Eyes: Conjunctivae and EOM are normal. Pupils are equal, round, and reactive to light Right Ear: External ear normal without discharge Left Ear: External ear normal without discharge Nose: Nose without discharge or deformity Mouth/Throat: Oropharynx is without other ulcerations and moist  Neck: Normal range of motion. Neck supple. No JVD present. No tracheal deviation present or significant neck LA or mass Cardiovascular: Normal rate,  regular rhythm, normal heart sounds and intact distal pulses.   Pulmonary/Chest: WOB normal and breath sounds without rales or wheezing  Abdominal: Soft. Bowel sounds are normal. NT. No HSM  Musculoskeletal: Normal range of motion. Exhibits no edema Lymphadenopathy: Has no other cervical adenopathy.  Neurological: Pt is alert and oriented to person, place, and time. Pt has normal reflexes. No cranial nerve deficit. Motor grossly intact, Gait intact Skin: Skin is warm and dry. No rash noted or new ulcerations Psychiatric:  Has normal mood and affect. Behavior is normal without agitation No other exam findings    Assessment & Plan:

## 2017-08-12 NOTE — Patient Instructions (Signed)
[name] will help you sign up for the Cologuard  Please continue all other medications as before, and refills have been done if requested.  Please have the pharmacy call with any other refills you may need.  Please continue your efforts at being more active, low cholesterol diet, and weight control.  You are otherwise up to date with prevention measures today.  Please keep your appointments with your specialists as you may have planned  Please go to the LAB in the Basement (turn left off the elevator) for the tests to be done today  You will be contacted by phone if any changes need to be made immediately.  Otherwise, you will receive a letter about your results with an explanation, but please check with MyChart first.  Please remember to sign up for MyChart if you have not done so, as this will be important to you in the future with finding out test results, communicating by private email, and scheduling acute appointments online when needed.  Please return in 1 year for your yearly visit, or sooner if needed, with Lab testing done 3-5 days before

## 2017-08-13 ENCOUNTER — Encounter: Payer: Self-pay | Admitting: Internal Medicine

## 2017-08-13 NOTE — Assessment & Plan Note (Signed)
stable overall by history and exam, recent data reviewed with pt, and pt to continue medical treatment as before,  to f/u any worsening symptoms or concerns, for f/u a1c with labs today 

## 2017-08-13 NOTE — Assessment & Plan Note (Signed)

## 2017-08-23 ENCOUNTER — Telehealth: Payer: Self-pay

## 2017-08-23 NOTE — Telephone Encounter (Signed)
Dr. Jenny Reichmann do you have this paperwork?  Copied from [place] [id number]. Topic: General - Other >> Aug 22, 2017  9:15 AM Burnis Medin, NT wrote: Reason for CRM: Patient called and said she wanted to see if the doctor had a chance to complete her FMLA paper work. Pt would like a call back.   >> Aug 23, 2017  8:17 AM Para Skeans A wrote: Do you have this paperwork [name]? I do not.

## 2017-08-23 NOTE — Telephone Encounter (Signed)
I do not have any forms? I did not see any in pick up. I do not know if it has already been sent to scan. ?

## 2017-08-23 NOTE — Telephone Encounter (Signed)
I think was finished late last wk  Please check at front if we dont have this

## 2017-08-30 NOTE — Telephone Encounter (Signed)
I have received the FMLA forms. They been completed & placed in providers box to review and sign.   He is out of the office, should return tomorrow. Thank you.

## 2017-08-30 NOTE — Telephone Encounter (Signed)
Noted  

## 2017-08-30 NOTE — Progress Notes (Signed)
[name] [address], [place] [address] Phone: [phone] Subjective:     CC: Back pain follow-up  FBP:[id number]  Rachel [name] is a 52 y.o. female coming in with complaint of back pain and neck pain follow-up. 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 She started having lower back pain last week.  Patient has been moving patient is on a more regular basis.  Has responded well to osteopathic manipulation more.      Past Medical History:  Diagnosis Date  . ALLERGIC RHINITIS 10/04/2007  . Anemia 01/21/2011  . ASTHMA 08/02/2007   INHALERS ONLY IN [place]  . Blood transfusion without reported diagnosis    with first child   . COMMON MIGRAINE 05/15/2009  . DIABETES MELLITUS, TYPE II 08/02/2007  . GERD 08/02/2007  . HYPERLIPIDEMIA 08/02/2007  . INSOMNIA-SLEEP DISORDER-UNSPEC 01/04/2008  . INTERMITTENT VERTIGO 05/15/2009  . LIBIDO, DECREASED 01/09/2010   Past Surgical History:  Procedure Laterality Date  . CESAREAN SECTION     x 3  . CYST EXCISION     left knee  . PAROTIDECTOMY  10/21/2011   Procedure: PAROTIDECTOMY;  Surgeon: Melida Quitter, MD;  Location: [facility] OR;  Service: ENT;  Laterality: Left;   Social History   Socioeconomic History  . Marital status: Married    Spouse name: [name]  . Number of children: 3  . Years of education: Degree  . Highest education level: Not on file  Social Needs  . Financial resource strain: Not on file  . Food insecurity - worry: Not on file  . Food insecurity - inability: Not on file  . Transportation needs - medical: Not on file  . Transportation needs - non-medical: Not on file  Occupational History  . Occupation: Research scientist (life sciences): [employer]  . Occupation: Chartered certified accountant    Employer: [employer]  Tobacco Use  . Smoking status: Never Smoker  . Smokeless tobacco: Never Used  Substance and Sexual Activity  . Alcohol use: No    Alcohol/week: 0.0 oz  . Drug use: No    . Sexual activity: Yes  Other Topics Concern  . Not on file  Social History Narrative   She has 3 daughters.   Patient has a 4 year degree.    Patient working at [employer].    Patient is married to [place].   Allergies  Allergen Reactions  . Lipitor [Atorvastatin]     REACTION: myalgias  . Nitroglycerin    Family History  Problem Relation Age of Onset  . Hypertension Father   . Diabetes Father   . Asthma Father   . Cervical cancer Maternal Aunt   . Heart disease Maternal Aunt   . Anesthesia problems Neg Hx   . Colon cancer Neg Hx      Past medical history, social, surgical and family history all reviewed in electronic medical record.  No pertanent information unless stated regarding to the chief complaint.   Review of Systems:Review of systems updated and as accurate as of 08/31/17  No headache, visual changes, nausea, vomiting, diarrhea, constipation, dizziness, abdominal pain, skin rash, fevers, chills, night sweats, weight loss, swollen lymph nodes, body aches, joint swelling, chest pain, shortness of breath, mood changes.  Positive muscle aches  Objective  Blood pressure 118/80, pulse 97, height 5\' 1"  (1.549 m), weight 138 lb (62.6 kg), SpO2 99 %. Systems examined below as of 08/31/17  General: No apparent distress alert and oriented x3 mood and affect normal, dressed appropriately.  HEENT: Pupils equal, extraocular movements intact  Respiratory: Patient's speak in full sentences and does not appear short of breath  Cardiovascular: No lower extremity edema, non tender, no erythema  Skin: Warm dry intact with no signs of infection or rash on extremities or on axial skeleton.  Abdomen: Soft nontender  Neuro: Cranial nerves II through XII are intact, neurovascularly intact in all extremities with 2+ DTRs and 2+ pulses.  Lymph: No lymphadenopathy of posterior or anterior cervical chain or axillae bilaterally.  Gait normal with good balance and coordination.  MSK:  Non  tender with full range of motion and good stability and symmetric strength and tone of shoulders, elbows, wrist, hip, knee and ankles bilaterally.  Neck: Inspection mild loss of lordosis. No palpable stepoffs. Negative Spurling's maneuver. Mild loss of lordosis. Grip strength and sensation normal in bilateral hands Strength good C4 to T1 distribution No sensory change to C4 to T1 Negative Hoffman sign bilaterally Reflexes normal  Tightness on the right side paraspinal musculature.  Back Exam:  Inspection: Loss of lordosis Motion: Flexion 45 deg, Extension 20 deg, Side Bending to 35 deg bilaterally, Rotation to 35 deg bilaterally  SLR laying: Negative  XSLR laying: Negative  Palpable tenderness: Tenderness to palpation in the paraspinal musculature lumbar spine thoracolumbar and lumbosacral.. FABER: negative. Sensory change: Gross sensation intact to all lumbar and sacral dermatomes.  Reflexes: 2+ at both patellar tendons, 2+ at achilles tendons, Babinski's downgoing.  Strength at foot  Plantar-flexion: 5/5 Dorsi-flexion: 5/5 Eversion: 5/5 Inversion: 5/5  Leg strength  Quad: 5/5 Hamstring: 5/5 Hip flexor: 5/5 Hip abductors: 4/5 but symmetric Gait unremarkable.  Osteopathic findings C2 flexed rotated and side bent right C4 flexed rotated and side bent left T3 extended rotated and side bent right  T9 extended rotated and side bent left L3 flexed rotated and side bent right Sacrum right on right     Impression and Recommendations:     This case required medical decision making of moderate complexity.      Note: This dictation was prepared with Dragon dictation along with smaller phrase technology. Any transcriptional errors that result from this process are unintentional.

## 2017-08-31 ENCOUNTER — Ambulatory Visit (INDEPENDENT_AMBULATORY_CARE_PROVIDER_SITE_OTHER): Payer: 59 | Admitting: Family Medicine

## 2017-08-31 ENCOUNTER — Encounter: Payer: Self-pay | Admitting: Family Medicine

## 2017-08-31 VITALS — BP 118/80 | HR 97 | Ht 61.0 in | Wt 138.0 lb

## 2017-08-31 DIAGNOSIS — Z0279 Encounter for issue of other medical certificate: Secondary | ICD-10-CM

## 2017-08-31 DIAGNOSIS — R51 Headache: Secondary | ICD-10-CM

## 2017-08-31 DIAGNOSIS — M999 Biomechanical lesion, unspecified: Secondary | ICD-10-CM | POA: Diagnosis not present

## 2017-08-31 DIAGNOSIS — G4486 Cervicogenic headache: Secondary | ICD-10-CM

## 2017-08-31 MED ORDER — MELOXICAM 15 MG PO TABS: 15.0000 mg | ORAL_TABLET | Freq: Every day | ORAL | 0 refills | Status: DC

## 2017-08-31 NOTE — Assessment & Plan Note (Signed)
Decision today to treat with OMT was based on Physical Exam  After verbal consent patient was treated with HVLA, ME, FPR techniques in cervical, thoracic, lumbar and sacral areas  Patient tolerated the procedure well with improvement in symptoms  Patient given exercises, stretches and lifestyle modifications  See medications in patient instructions if given  Patient will follow up in 4 weeks 

## 2017-08-31 NOTE — Telephone Encounter (Signed)
Signed, faxed, copy sent to scan & charged for.   Original to patient.

## 2017-08-31 NOTE — Assessment & Plan Note (Signed)
Stable stress related, discussed HEP  Discussed which activities to do  Discussed proper lifting technique  Return to clinic in 4 weeks

## 2017-08-31 NOTE — Patient Instructions (Signed)
Good to see you  I refilled meloxicam daily for 10 days then as needed Keep working on the posture.  I hope it will get better  See me again in 4 weeks

## 2017-09-02 ENCOUNTER — Ambulatory Visit: Payer: 59 | Admitting: Neurology

## 2017-09-08 ENCOUNTER — Ambulatory Visit (INDEPENDENT_AMBULATORY_CARE_PROVIDER_SITE_OTHER): Payer: 59 | Admitting: Neurology

## 2017-09-08 DIAGNOSIS — G43709 Chronic migraine without aura, not intractable, without status migrainosus: Secondary | ICD-10-CM

## 2017-09-08 MED ORDER — ONABOTULINUMTOXINA 100 UNITS IJ SOLR
155.0000 [IU] | Freq: Once | INTRAMUSCULAR | Status: AC
  Administered 2017-09-08: 155 [IU] via INTRAMUSCULAR

## 2017-09-08 NOTE — Progress Notes (Signed)
[facility]   Procedure Note Botox  Attending: Dr. Twylla Arceneaux  Preoperative Diagnosis(es): Chronic migraine  Consent obtained from: The patient Benefits discussed included, but were not limited to decreased muscle tightness, increased joint range of motion, and decreased pain.  Risk discussed included, but were not limited pain and discomfort, bleeding, bruising, excessive weakness, venous thrombosis, muscle atrophy and dysphagia.  Anticipated outcomes of the procedure as well as he risks and benefits of the alternatives to the procedure, and the roles and tasks of the personnel to be involved, were discussed with the patient, and the patient consents to the procedure and agrees to proceed. A copy of the patient medication guide was given to the patient which explains the blackbox warning.  Patients identity and treatment sites confirmed Yes.  .  Details of Procedure: Skin was cleaned with alcohol. Prior to injection, the needle plunger was aspirated to make sure the needle was not within a blood vessel.  There was no blood retrieved on aspiration.    Following is a summary of the muscles injected  And the amount of Botulinum toxin used:  Dilution 200 units of Botox was reconstituted with 4 ml of preservative free normal saline. Time of reconstitution: At the time of the office visit (<30 minutes prior to injection)   Injections  155 total units of Botox was injected with a 30 gauge needle.  Injection Sites: L occipitalis: 15 units- 3 sites  R occiptalis: 15 units- 3 sites  L upper trapezius: 15 units- 3 sites R upper trapezius: 15 units- 3 sits          L paraspinal: 10 units- 2 sites R paraspinal: 10 units- 2 sites  Face L frontalis(2 injection sites):10 units   R frontalis(2 injection sites):10 units         L corrugator: 5 units   R corrugator: 5 units           Procerus: 5 units   L temporalis: 20 units R temporalis: 20 units   Agent:  200 units of botulinum Type A  (Onobotulinum Toxin type A) was reconstituted with 4 ml of preservative free normal saline.  Time of reconstitution: At the time of the office visit (<30 minutes prior to injection)     Total injected (Units): 155  Total wasted (Units): 15  Patient tolerated procedure well without complications.   Reinjection is anticipated in 3 months.  

## 2017-09-08 NOTE — Addendum Note (Signed)
Addended by: Clois Comber on: 09/08/2017 03:30 PM   Modules accepted: Orders

## 2017-09-28 ENCOUNTER — Encounter: Payer: Self-pay | Admitting: Family Medicine

## 2017-09-28 ENCOUNTER — Ambulatory Visit (INDEPENDENT_AMBULATORY_CARE_PROVIDER_SITE_OTHER): Payer: 59 | Admitting: Family Medicine

## 2017-09-28 VITALS — BP 108/68 | HR 96 | Ht 61.0 in | Wt 141.0 lb

## 2017-09-28 DIAGNOSIS — R51 Headache: Secondary | ICD-10-CM

## 2017-09-28 DIAGNOSIS — G4486 Cervicogenic headache: Secondary | ICD-10-CM

## 2017-09-28 DIAGNOSIS — M999 Biomechanical lesion, unspecified: Secondary | ICD-10-CM | POA: Diagnosis not present

## 2017-09-28 MED ORDER — GABAPENTIN 300 MG PO CAPS: ORAL_CAPSULE | ORAL | 3 refills | Status: DC

## 2017-09-28 NOTE — Patient Instructions (Signed)
Good to see you  Increase gabapentin 300mg  at night Good shoes can help  Keep up with the exercises and the vitamins See me again in 4 week s

## 2017-09-28 NOTE — Assessment & Plan Note (Signed)
Cervicogenic headaches.  Discussed icing regimen and posture, discussed ergonomics.  Discussed which activities to do which wants to avoid.  Patient is to increase activity as tolerated.  Follow-up again in 4-8 weeks

## 2017-09-28 NOTE — Assessment & Plan Note (Signed)
Decision today to treat with OMT was based on Physical Exam  After verbal consent patient was treated with HVLA, ME, FPR techniques in cervical, thoracic, lumbar and sacral areas  Patient tolerated the procedure well with improvement in symptoms  Patient given exercises, stretches and lifestyle modifications  See medications in patient instructions if given  Patient will follow up in 4-8 weeks 

## 2017-09-28 NOTE — Progress Notes (Signed)
[name] [address], [place] [address] Phone: [phone] Subjective:    I'm seeing this patient by the request  of:    CC: Back pain follow-up  [id number]  Rachel [name] is a 52 y.o. female coming in with complaint of back pain. She had an increase in her pain this week and she is unsure why. She said that she has 3 weeks of relief after OMT but by the 4th week she needs an adjustment.  Patient states that she has been doing a lot more lifting recently that seems to be aggravating it.  Seems to be more in the neck and the upper back.  Discussed which activities to do.  States that there is not any specific exacerbation.      Past Medical History:  Diagnosis Date  . ALLERGIC RHINITIS 10/04/2007  . Anemia 01/21/2011  . ASTHMA 08/02/2007   INHALERS ONLY IN [place]  . Blood transfusion without reported diagnosis    with first child   . COMMON MIGRAINE 05/15/2009  . DIABETES MELLITUS, TYPE II 08/02/2007  . GERD 08/02/2007  . HYPERLIPIDEMIA 08/02/2007  . INSOMNIA-SLEEP DISORDER-UNSPEC 01/04/2008  . INTERMITTENT VERTIGO 05/15/2009  . LIBIDO, DECREASED 01/09/2010   Past Surgical History:  Procedure Laterality Date  . CESAREAN SECTION     x 3  . CYST EXCISION     left knee  . PAROTIDECTOMY  10/21/2011   Procedure: PAROTIDECTOMY;  Surgeon: Melida Quitter, MD;  [facility]: [facility] OR;  Service: ENT;  Laterality: Left;   Social History   Socioeconomic History  . Marital status: Married    Spouse name: [name]  . Number of children: 3  . Years of education: Degree  . Highest education level: Not on file  Occupational History  . Occupation: Research scientist (life sciences): [employer]  . Occupation: Chartered certified accountant    Employer: [employer]  . Financial resource strain: Not on file  . Food insecurity:    Worry: Not on file    Inability: Not on file  . Transportation needs:    Medical: Not on file    Non-medical: Not on file  Tobacco Use    . Smoking status: Never Smoker  . Smokeless tobacco: Never Used  Substance and Sexual Activity  . Alcohol use: No    Alcohol/week: 0.0 oz  . Drug use: No  . Sexual activity: Yes  Lifestyle  . Physical activity:    Days per week: Not on file    Minutes per session: Not on file  . Stress: Not on file  Relationships  . Social connections:    Talks on phone: Not on file    Gets together: Not on file    Attends religious service: Not on file    Active member of club or organization: Not on file    Attends meetings of clubs or organizations: Not on file    Relationship status: Not on file  Other Topics Concern  . Not on file  Social History Narrative   She has 3 daughters.   Patient has a 4 year degree.    Patient working at [employer].    Patient is married to [place].   Allergies  Allergen Reactions  . Lipitor [Atorvastatin]     REACTION: myalgias  . Nitroglycerin    Family History  Problem Relation Age of Onset  . Hypertension Father   . Diabetes Father   .  Asthma Father   . Cervical cancer Maternal Aunt   . Heart disease Maternal Aunt   . Anesthesia problems Neg Hx   . Colon cancer Neg Hx      Past medical history, social, surgical and family history all reviewed in electronic medical record.  No pertanent information unless stated regarding to the chief complaint.   Review of Systems:Review of systems updated and as accurate as of 09/28/17  No  visual changes, nausea, vomiting, diarrhea, constipation, dizziness, abdominal pain, skin rash, fevers, chills, night sweats, weight loss, swollen lymph nodes, body aches, joint swelling,  chest pain, shortness of breath, mood changes.  Positive muscle aches, headaches  Objective  Blood pressure 108/68, pulse 96, height 5\' 1"  (1.549 m), weight 141 lb (64 kg), SpO2 98 %. Systems examined below as of 09/28/17   General: No apparent distress alert and oriented x3 mood and affect normal, dressed appropriately.  HEENT: Pupils  equal, extraocular movements intact  Respiratory: Patient's speak in full sentences and does not appear short of breath  Cardiovascular: No lower extremity edema, non tender, no erythema  Skin: Warm dry intact with no signs of infection or rash on extremities or on axial skeleton.  Abdomen: Soft nontender  Neuro: Cranial nerves II through XII are intact, neurovascularly intact in all extremities with 2+ DTRs and 2+ pulses.  Lymph: No lymphadenopathy of posterior or anterior cervical chain or axillae bilaterally.  Gait normal with good balance and coordination.  MSK:  Non tender with full range of motion and good stability and symmetric strength and tone of shoulders, elbows, wrist, hip, knee and ankles bilaterally.  Back Exam:  Inspection: Loss of lordosis Motion: Flexion 45 deg, Extension 25 deg, Side Bending to 35 deg bilaterally,  Rotation to 35 deg bilaterally  SLR laying: Negative  XSLR laying: Negative  Palpable tenderness: Tender to palpation the paraspinal musculature lumbar spine right greater than left more pain in the thoracolumbar junction.Marland Kitchen FABER: negative. Sensory change: Gross sensation intact to all lumbar and sacral dermatomes.  Reflexes: 2+ at both patellar tendons, 2+ at achilles tendons, Babinski's downgoing.  Strength at foot  Plantar-flexion: 5/5 Dorsi-flexion: 5/5 Eversion: 5/5 Inversion: 5/5  Leg strength  Quad: 5/5 Hamstring: 5/5 Hip flexor: 5/5 Hip abductors: 5/5  Gait unremarkable.  Neck pain is on the right side.  Near full range of motion.  Tightness of the right trapezius noted.  Negative Spurling's test and full strength of the upper extremities.  Osteopathic findings C2 flexed rotated and side bent right C4 flexed rotated and side bent left C7 flexed rotated and side bent left T9 extended rotated and side bent left L2 flexed rotated and side bent right Sacrum right on right     Impression and Recommendations:     This case required medical  decision making of moderate complexity.      Note: This dictation was prepared with Dragon dictation along with smaller phrase technology. Any transcriptional errors that result from this process are unintentional.

## 2017-10-03 ENCOUNTER — Other Ambulatory Visit: Payer: Self-pay | Admitting: Internal Medicine

## 2017-10-03 DIAGNOSIS — E119 Type 2 diabetes mellitus without complications: Secondary | ICD-10-CM

## 2017-10-06 ENCOUNTER — Ambulatory Visit (INDEPENDENT_AMBULATORY_CARE_PROVIDER_SITE_OTHER): Payer: 59

## 2017-10-06 DIAGNOSIS — Z01419 Encounter for gynecological examination (general) (routine) without abnormal findings: Secondary | ICD-10-CM | POA: Diagnosis not present

## 2017-10-06 DIAGNOSIS — N941 Unspecified dyspareunia: Secondary | ICD-10-CM | POA: Diagnosis not present

## 2017-10-06 DIAGNOSIS — N952 Postmenopausal atrophic vaginitis: Secondary | ICD-10-CM | POA: Diagnosis not present

## 2017-10-06 DIAGNOSIS — G43709 Chronic migraine without aura, not intractable, without status migrainosus: Secondary | ICD-10-CM | POA: Diagnosis not present

## 2017-10-06 MED ORDER — KETOROLAC TROMETHAMINE 60 MG/2ML IM SOLN
60.0000 mg | Freq: Once | INTRAMUSCULAR | Status: AC
  Administered 2017-10-06: 60 mg via INTRAMUSCULAR

## 2017-10-07 ENCOUNTER — Telehealth: Payer: Self-pay

## 2017-10-07 MED ORDER — PREDNISONE 10 MG (21) PO TBPK: ORAL_TABLET | ORAL | 0 refills | Status: DC

## 2017-10-07 NOTE — Telephone Encounter (Signed)
OK 

## 2017-10-07 NOTE — Telephone Encounter (Signed)
Called Pt and advsd her Rx sent in

## 2017-10-07 NOTE — Telephone Encounter (Signed)
Pt called, toradol injection yesterday was effective until 8pm when headache returned. It is worse this morning. She would like to have prednisone taper called in.

## 2017-10-11 ENCOUNTER — Encounter: Payer: Self-pay | Admitting: Neurology

## 2017-10-11 ENCOUNTER — Other Ambulatory Visit: Payer: Self-pay | Admitting: Internal Medicine

## 2017-10-11 DIAGNOSIS — Z1231 Encounter for screening mammogram for malignant neoplasm of breast: Secondary | ICD-10-CM

## 2017-10-25 NOTE — Progress Notes (Signed)
[name] [address] [place], [place] [address] Phone: [phone] Subjective:    I'm seeing this patient by the request  of:    CC: Back pain follow-up  [id number]  Rachel [name] is a 52 y.o. female coming in with complaint of back pain.  Patient has been increasing activity as tolerated very slowly.  Patient has noted some tightness recently.  Patient feels that it is tight from her neck all the way down to her lower back.  No radiation of the leg.  Just tightness overall.     Past Medical History:  Diagnosis Date  . ALLERGIC RHINITIS 10/04/2007  . Anemia 01/21/2011  . ASTHMA 08/02/2007   INHALERS ONLY IN [place]  . Blood transfusion without reported diagnosis    with first child   . COMMON MIGRAINE 05/15/2009  . DIABETES MELLITUS, TYPE II 08/02/2007  . GERD 08/02/2007  . HYPERLIPIDEMIA 08/02/2007  . INSOMNIA-SLEEP DISORDER-UNSPEC 01/04/2008  . INTERMITTENT VERTIGO 05/15/2009  . LIBIDO, DECREASED 01/09/2010   Past Surgical History:  Procedure Laterality Date  . CESAREAN SECTION     x 3  . CYST EXCISION     left knee  . PAROTIDECTOMY  10/21/2011   Procedure: PAROTIDECTOMY;  Surgeon: Melida Quitter, MD;  Location: [facility] OR;  Service: ENT;  Laterality: Left;   Social History   Socioeconomic History  . Marital status: Married    Spouse name: [name]  . Number of children: 3  . Years of education: Degree  . Highest education level: Not on file  Occupational History  . Occupation: Research scientist (life sciences): [employer]  . Occupation: Chartered certified accountant    Employer: [employer]  . Financial resource strain: Not on file  . Food insecurity:    Worry: Not on file    Inability: Not on file  . Transportation needs:    Medical: Not on file    Non-medical: Not on file  Tobacco Use  . Smoking status: Never Smoker  . Smokeless tobacco: Never Used  Substance and Sexual Activity  . Alcohol use: No    Alcohol/week: 0.0 oz  . Drug use: No    . Sexual activity: Yes  Lifestyle  . Physical activity:    Days per week: Not on file    Minutes per session: Not on file  . Stress: Not on file  Relationships  . Social connections:    Talks on phone: Not on file    Gets together: Not on file    Attends religious service: Not on file    Active member of club or organization: Not on file    Attends meetings of clubs or organizations: Not on file    Relationship status: Not on file  Other Topics Concern  . Not on file  Social History Narrative   She has 3 daughters.   Patient has a 4 year degree.    Patient working at [employer].    Patient is married to [place].   Allergies  Allergen Reactions  . Lipitor [Atorvastatin]     REACTION: myalgias  . Nitroglycerin    Family History  Problem Relation Age of Onset  . Hypertension Father   . Diabetes Father   . Asthma Father   . Cervical cancer Maternal Aunt   . Heart disease Maternal Aunt   . Anesthesia problems Neg Hx   . Colon cancer Neg Hx      Past  medical history, social, surgical and family history all reviewed in electronic medical record.  No pertanent information unless stated regarding to the chief complaint.   Review of Systems:Review of systems updated and as accurate as of 10/26/17  No headache, visual changes, nausea, vomiting, diarrhea, constipation, dizziness, abdominal pain, skin rash, fevers, chills, night sweats, weight loss, swollen lymph nodes, body aches, joint swelling,  chest pain, shortness of breath, mood changes.  Positive muscle aches  Objective  Blood pressure 96/74, pulse 99, height 5\' 1"  (1.549 m), weight 142 lb (64.4 kg), SpO2 96 %. Systems examined below as of 10/26/17   General: No apparent distress alert and oriented x3 mood and affect normal, dressed appropriately.  HEENT: Pupils equal, extraocular movements intact  Respiratory: Patient's speak in full sentences and does not appear short of breath  Cardiovascular: No lower extremity  edema, non tender, no erythema  Skin: Warm dry intact with no signs of infection or rash on extremities or on axial skeleton.  Abdomen: Soft nontender  Neuro: Cranial nerves II through XII are intact, neurovascularly intact in all extremities with 2+ DTRs and 2+ pulses.  Lymph: No lymphadenopathy of posterior or anterior cervical chain or axillae bilaterally.  Gait normal with good balance and coordination.  MSK:  Non tender with full range of motion and good stability and symmetric strength and tone of shoulders, elbows, wrist, hip, knee and ankles bilaterally.  Neck: Inspection mild loss of lordosis of the neck and tightness. No palpable stepoffs. Negative Spurling's maneuver. Tightness in all planes Grip strength and sensation normal in bilateral hands Strength good C4 to T1 distribution No sensory change to C4 to T1 Negative Hoffman sign bilaterally Reflexes normal Tightness of the left trapezius.  Back Exam:  Inspection: Loss of lordosis Motion: Flexion 45 deg, Extension 25 deg, Side Bending to 35 deg bilaterally,  Rotation to 35 deg bilaterally  SLR laying: Negative  XSLR laying: Negative  Palpable tenderness: Tender to palpation the paraspinal musculature lumbar spine right greater than left. FABER: Tightness bilaterally. Sensory change: Gross sensation intact to all lumbar and sacral dermatomes.  Reflexes: 2+ at both patellar tendons, 2+ at achilles tendons, Babinski's downgoing.  Strength at foot  Plantar-flexion: 5/5 Dorsi-flexion: 5/5 Eversion: 5/5 Inversion: 5/5  Leg strength  Quad: 5/5 Hamstring: 5/5 Hip flexor: 5/5 Hip abductors: 4/5 but symmetric Gait unremarkable.  Osteopathic findings C5 flexed rotated and side bent left T3 extended rotated and side bent right inhaled third rib L4 flexed rotated and side bent right Sacrum right on right     Impression and Recommendations:     This case required medical decision making of moderate complexity.       Note: This dictation was prepared with Dragon dictation along with smaller phrase technology. Any transcriptional errors that result from this process are unintentional.

## 2017-10-26 ENCOUNTER — Ambulatory Visit (INDEPENDENT_AMBULATORY_CARE_PROVIDER_SITE_OTHER): Payer: 59 | Admitting: Family Medicine

## 2017-10-26 ENCOUNTER — Encounter: Payer: Self-pay | Admitting: Family Medicine

## 2017-10-26 VITALS — BP 96/74 | HR 99 | Ht 61.0 in | Wt 142.0 lb

## 2017-10-26 DIAGNOSIS — M999 Biomechanical lesion, unspecified: Secondary | ICD-10-CM | POA: Diagnosis not present

## 2017-10-26 DIAGNOSIS — G8929 Other chronic pain: Secondary | ICD-10-CM | POA: Diagnosis not present

## 2017-10-26 DIAGNOSIS — M545 Low back pain: Secondary | ICD-10-CM | POA: Diagnosis not present

## 2017-10-26 DIAGNOSIS — G4486 Cervicogenic headache: Secondary | ICD-10-CM

## 2017-10-26 DIAGNOSIS — R51 Headache: Secondary | ICD-10-CM

## 2017-10-26 NOTE — Assessment & Plan Note (Signed)
Patient did have a migraine last week that did last 1 week.  Patient responded very well to manipulation today and hopefully will be beneficial.  Low back is likely still secondary to muscle imbalances.  Discussed icing regimen and home exercise.  Discussed which activities to do which was to avoid.  Increase activity as tolerated.

## 2017-10-26 NOTE — Assessment & Plan Note (Signed)
Decision today to treat with OMT was based on Physical Exam  After verbal consent patient was treated with HVLA, ME, FPR techniques in cervical, thoracic, lumbar and sacral areas  Patient tolerated the procedure well with improvement in symptoms  Patient given exercises, stretches and lifestyle modifications  See medications in patient instructions if given  Patient will follow up in 4 weeks 

## 2017-10-26 NOTE — Patient Instructions (Signed)
Good to see you  Ice is yoru friend Stay active Lets see you again in 4 weeks to make sure you are doing a little better

## 2017-10-26 NOTE — Assessment & Plan Note (Signed)
Tightness deconditioning.  Discussed again about core strengthening.  Patient will follow-up again in 4 weeks

## 2017-10-27 ENCOUNTER — Other Ambulatory Visit: Payer: Self-pay | Admitting: Internal Medicine

## 2017-10-27 NOTE — Telephone Encounter (Signed)
Done erx 

## 2017-11-02 ENCOUNTER — Ambulatory Visit
Admission: RE | Admit: 2017-11-02 | Discharge: 2017-11-02 | Disposition: A | Discharge status: Home / Self Care | Payer: 59 | Source: Ambulatory Visit | Attending: Internal Medicine | Admitting: Internal Medicine

## 2017-11-02 DIAGNOSIS — Z1231 Encounter for screening mammogram for malignant neoplasm of breast: Secondary | ICD-10-CM

## 2017-11-22 NOTE — Progress Notes (Signed)
[name] [place], [place] [address] Phone: [phone] Subjective:     CC: Back pain  DUK:[id number]  Fatina Sprankle is a 52 y.o. female coming in with complaint of back pain. Patient has had relief from OMT. No changes to her pain since last visit.  Patient continues to have some difficulty.  Patient does respond well though to home exercises.  Has responded well known to manipulation.    Past Medical History:  Diagnosis Date  . ALLERGIC RHINITIS 10/04/2007  . Anemia 01/21/2011  . ASTHMA 08/02/2007   INHALERS ONLY IN [place]  . Blood transfusion without reported diagnosis    with first child   . COMMON MIGRAINE 05/15/2009  . DIABETES MELLITUS, TYPE II 08/02/2007  . GERD 08/02/2007  . HYPERLIPIDEMIA 08/02/2007  . INSOMNIA-SLEEP DISORDER-UNSPEC 01/04/2008  . INTERMITTENT VERTIGO 05/15/2009  . LIBIDO, DECREASED 01/09/2010   Past Surgical History:  Procedure Laterality Date  . CESAREAN SECTION     x 3  . CYST EXCISION     left knee  . PAROTIDECTOMY  10/21/2011   Procedure: PAROTIDECTOMY;  Surgeon: Melida Quitter, MD;  Location: [facility] OR;  Service: ENT;  Laterality: Left;   Social History   Socioeconomic History  . Marital status: Married    Spouse name: [name]  . Number of children: 3  . Years of education: Degree  . Highest education level: Not on file  Occupational History  . Occupation: Research scientist (life sciences): [employer]  . Occupation: Chartered certified accountant    Employer: [employer]  . Financial resource strain: Not on file  . Food insecurity:    Worry: Not on file    Inability: Not on file  . Transportation needs:    Medical: Not on file    Non-medical: Not on file  Tobacco Use  . Smoking status: Never Smoker  . Smokeless tobacco: Never Used  Substance and Sexual Activity  . Alcohol use: No    Alcohol/week: 0.0 oz  . Drug use: No  . Sexual activity: Yes  Lifestyle  . Physical activity:    Days per week: Not on  file    Minutes per session: Not on file  . Stress: Not on file  Relationships  . Social connections:    Talks on phone: Not on file    Gets together: Not on file    Attends religious service: Not on file    Active member of club or organization: Not on file    Attends meetings of clubs or organizations: Not on file    Relationship status: Not on file  Other Topics Concern  . Not on file  Social History Narrative   She has 3 daughters.   Patient has a 4 year degree.    Patient working at [employer].    Patient is married to [place].   Allergies  Allergen Reactions  . Lipitor [Atorvastatin]     REACTION: myalgias  . Nitroglycerin    Family History  Problem Relation Age of Onset  . Hypertension Father   . Diabetes Father   . Asthma Father   . Cervical cancer Maternal Aunt   . Heart disease Maternal Aunt   . Anesthesia problems Neg Hx   . Colon cancer Neg Hx      Past medical history, social, surgical and family history all reviewed in electronic medical record.  No pertanent information unless stated regarding to the  chief complaint.   Review of Systems:Review of systems updated and as accurate as of 11/23/17  No headache, visual changes, nausea, vomiting, diarrhea, constipation, dizziness, abdominal pain, skin rash, fevers, chills, night sweats, weight loss, swollen lymph nodes, body aches, joint swelling, muscle aches, chest pain, shortness of breath, mood changes.  Positive muscle aches  Objective  Blood pressure 102/72, pulse 94, height 5\' 1"  (1.549 m), weight 142 lb (64.4 kg), SpO2 95 %. Systems examined below as of 11/23/17   General: No apparent distress alert and oriented x3 mood and affect normal, dressed appropriately.  HEENT: Pupils equal, extraocular movements intact  Respiratory: Patient's speak in full sentences and does not appear short of breath  Cardiovascular: No lower extremity edema, non tender, no erythema  Skin: Warm dry intact with no signs of  infection or rash on extremities or on axial skeleton.  Abdomen: Soft nontender  Neuro: Cranial nerves II through XII are intact, neurovascularly intact in all extremities with 2+ DTRs and 2+ pulses.  Lymph: No lymphadenopathy of posterior or anterior cervical chain or axillae bilaterally.  Gait normal with good balance and coordination.  MSK:  Non tender with full range of motion and good stability and symmetric strength and tone of shoulders, elbows, wrist, hip, knee and ankles bilaterally.  Patient's back exam shows a tightness of the parascapular region of the thoracic spine.  He does have some tightness as well.  Patient has some mild rotation of the neck to the right.  Negative Spurling's.  Lumbar spine has loss of lordosis.  Positive Faber on the right side.  Negative straight leg test.  Osteopathic findings C2 flexed rotated and side bent right C4 flexed rotated and side bent left T3 extended rotated and side bent right inhaled third rib L2 flexed rotated and side bent left Sacrum right on right     Impression and Recommendations:     This case required medical decision making of moderate complexity.      Note: This dictation was prepared with Dragon dictation along with smaller phrase technology. Any transcriptional errors that result from this process are unintentional.

## 2017-11-23 ENCOUNTER — Ambulatory Visit (INDEPENDENT_AMBULATORY_CARE_PROVIDER_SITE_OTHER): Payer: 59 | Admitting: Family Medicine

## 2017-11-23 ENCOUNTER — Ambulatory Visit (INDEPENDENT_AMBULATORY_CARE_PROVIDER_SITE_OTHER): Payer: 59 | Admitting: Internal Medicine

## 2017-11-23 ENCOUNTER — Encounter: Payer: Self-pay | Admitting: Family Medicine

## 2017-11-23 ENCOUNTER — Telehealth: Payer: Self-pay

## 2017-11-23 ENCOUNTER — Encounter: Payer: Self-pay | Admitting: Internal Medicine

## 2017-11-23 ENCOUNTER — Other Ambulatory Visit (INDEPENDENT_AMBULATORY_CARE_PROVIDER_SITE_OTHER): Payer: 59

## 2017-11-23 VITALS — BP 102/72 | HR 94 | Ht 61.0 in | Wt 142.0 lb

## 2017-11-23 VITALS — BP 108/70 | HR 98 | Ht 61.0 in | Wt 141.0 lb

## 2017-11-23 DIAGNOSIS — Z0000 Encounter for general adult medical examination without abnormal findings: Secondary | ICD-10-CM

## 2017-11-23 DIAGNOSIS — Z794 Long term (current) use of insulin: Secondary | ICD-10-CM | POA: Diagnosis not present

## 2017-11-23 DIAGNOSIS — E785 Hyperlipidemia, unspecified: Secondary | ICD-10-CM | POA: Diagnosis not present

## 2017-11-23 DIAGNOSIS — E1165 Type 2 diabetes mellitus with hyperglycemia: Secondary | ICD-10-CM | POA: Diagnosis not present

## 2017-11-23 DIAGNOSIS — M999 Biomechanical lesion, unspecified: Secondary | ICD-10-CM

## 2017-11-23 DIAGNOSIS — R51 Headache: Secondary | ICD-10-CM | POA: Diagnosis not present

## 2017-11-23 DIAGNOSIS — G4486 Cervicogenic headache: Secondary | ICD-10-CM

## 2017-11-23 LAB — URINALYSIS, ROUTINE W REFLEX MICROSCOPIC
Bilirubin Urine: NEGATIVE
Hgb urine dipstick: NEGATIVE
Ketones, ur: NEGATIVE
Leukocytes, UA: NEGATIVE
Nitrite: NEGATIVE
RBC / HPF: NONE SEEN (ref 0–?)
Specific Gravity, Urine: 1.01 (ref 1.000–1.030)
Total Protein, Urine: NEGATIVE
Urine Glucose: >=1000 — AB
Urobilinogen, UA: 0.2 (ref 0.0–1.0)
WBC, UA: NONE SEEN (ref 0–?)
pH: 6 (ref 5.0–8.0)

## 2017-11-23 LAB — CBC WITH DIFFERENTIAL/PLATELET
Basophils Absolute: 0.1 10*3/uL (ref 0.0–0.1)
Basophils Relative: 0.8 % (ref 0.0–3.0)
Eosinophils Absolute: 0.1 10*3/uL (ref 0.0–0.7)
Eosinophils Relative: 0.9 % (ref 0.0–5.0)
HCT: 37.1 % (ref 36.0–46.0)
Hemoglobin: 12.3 g/dL (ref 12.0–15.0)
Lymphocytes Relative: 46.2 % — ABNORMAL HIGH (ref 12.0–46.0)
Lymphs Abs: 3.1 10*3/uL (ref 0.7–4.0)
MCHC: 33.2 g/dL (ref 30.0–36.0)
MCV: 81.8 fl (ref 78.0–100.0)
Monocytes Absolute: 0.6 10*3/uL (ref 0.1–1.0)
Monocytes Relative: 9.1 % (ref 3.0–12.0)
Neutro Abs: 2.9 10*3/uL (ref 1.4–7.7)
Neutrophils Relative %: 43 % (ref 43.0–77.0)
Platelets: 369 10*3/uL (ref 150.0–400.0)
RBC: 4.54 Mil/uL (ref 3.87–5.11)
RDW: 13.6 % (ref 11.5–15.5)
WBC: 6.7 10*3/uL (ref 4.0–10.5)

## 2017-11-23 LAB — BASIC METABOLIC PANEL
BUN: 12 mg/dL (ref 6–23)
CO2: 24 mEq/L (ref 19–32)
Calcium: 9.4 mg/dL (ref 8.4–10.5)
Chloride: 98 mEq/L (ref 96–112)
Creatinine, Ser: 0.69 mg/dL (ref 0.40–1.20)
GFR: 94.41 mL/min (ref >60.00)
Glucose, Bld: 479 mg/dL — ABNORMAL HIGH (ref 70–99)
Potassium: 4.2 mEq/L (ref 3.5–5.1)
Sodium: 131 mEq/L — ABNORMAL LOW (ref 135–145)

## 2017-11-23 LAB — LIPID PANEL
Cholesterol: 285 mg/dL — ABNORMAL HIGH (ref 0–200)
HDL: 52.2 mg/dL (ref >39.00)
LDL Cholesterol: 194 mg/dL — ABNORMAL HIGH (ref 0–99)
NonHDL: 233.23
Total CHOL/HDL Ratio: 5
Triglycerides: 194 mg/dL — ABNORMAL HIGH (ref 0.0–149.0)
VLDL: 38.8 mg/dL (ref 0.0–40.0)

## 2017-11-23 LAB — POCT GLYCOSYLATED HEMOGLOBIN (HGB A1C): Hemoglobin A1C: 12.7 % — AB (ref 4.0–5.6)

## 2017-11-23 LAB — TSH: TSH: 1.12 u[IU]/mL (ref 0.35–4.50)

## 2017-11-23 MED ORDER — SEMAGLUTIDE(0.25 OR 0.5MG/DOS) 2 MG/1.5ML ~~LOC~~ SOPN: 0.5000 mg | PEN_INJECTOR | SUBCUTANEOUS | 5 refills | Status: DC

## 2017-11-23 NOTE — Addendum Note (Signed)
Addended by: Trenda Moots on: 11/12/5613 01:17 PM   Modules accepted: Orders

## 2017-11-23 NOTE — Assessment & Plan Note (Signed)
Cervicogenic headache.  Discussed icing regimen and home exercise.  Discussed which activities to doing which wants to avoid discussed topical anti-inflammatories.  Discussed core strengthening.  Follow-up again in 4 to 8 weeks

## 2017-11-23 NOTE — Addendum Note (Signed)
Addended by: Trenda Moots on: 2/76/3943 01:18 PM   Modules accepted: Orders

## 2017-11-23 NOTE — Patient Instructions (Signed)
Please continue: - Lantus 36 units in am - Metformin ER 100 mg 2x a day with meals  Please start: - Ozempic 0.25 mg weekly in am for 3 weeks, then increase to 0.5 mg weekly  Stop Januvia before the second dose of Ozempic.  Please return in 3 months with your sugar log.

## 2017-11-23 NOTE — Patient Instructions (Signed)
Good to see you  Overall a little tight Ice is your friend Stay active See me again in 4-5 weeks

## 2017-11-23 NOTE — Addendum Note (Signed)
Addended by: Trenda Moots on: 8/78/6767 01:17 PM   Modules accepted: Orders

## 2017-11-23 NOTE — Telephone Encounter (Signed)
Labs were expired New orders put in

## 2017-11-23 NOTE — Assessment & Plan Note (Signed)
Decision today to treat with OMT was based on Physical Exam  After verbal consent patient was treated with HVLA, ME, FPR techniques in cervical, thoracic, lumbar and sacral areas  Patient tolerated the procedure well with improvement in symptoms  Patient given exercises, stretches and lifestyle modifications  See medications in patient instructions if given  Patient will follow up in 4-8 weeks 

## 2017-11-23 NOTE — Progress Notes (Signed)
Subjective:     Patient ID: Rachel [name], female   DOB: 1965-07-25, 52 y.o.   MRN: [id number]  HPI [name] is a  52 y.o. woman, returning for f/u for DM2, dx 1987, uncontrolled, insulin-dependent, with probable complications (? peripheral neuropathy). Last visit 9 months ago.  Last HbA1C: 02/17/2017: HbA1c calculated from fructosamine is better, at 8.34%, but still high. Lab Results  Component Value Date   HGBA1C 9.2 02/17/2017   HGBA1C 9.2 11/11/2016   HGBA1C 11.5 (H) 06/25/2016  Prev. 10.1%.  Reviewed hx: She came off all DM medicines for 6 mo before the HbA1c in 09/2013. She again ran out of all meds in 04/2016.  She is now  on: - Lantus 36 units in am >> night >> am - Metformin XR 1000 mg 2x a day with b'fast and dinner - Januvia 100 mg in am  -  >> forgot to start We triedTrulicity 1.5 mg weekly >> worked great but had to stop b/c GERD >> stopped. We tried Jardiance 25 mg daily >> started 08/2016 >> but stopped recently 2/2 recurrent UTIs She was on Glipizide 5 mg bid - added 04/2015 >> was not taking it b/c low CBGs. We stopped Glipizide XL 5 mg in 07/2014. She had episodes of hypoglycemia with Amaryl in the past. She was on Bydureon 2 mg weekly (had nausea).  She checks her sugars 1-2 X a day: - am:   102-110, 260 >> 130s >> 134-200 - after b'fast: 158-251 >> n/c - before lunch:  n/c >> 137 >> n/c - after lunch: up to 200s - before dinner:  191, 308 >> n/c - after dinner: 166-245 >> n/c - bedtime:n140-150 >> 140-150s >> n/c She has hypoglycemia awareness in the 90s. Lowest: 38 (while on Trulicity) >> 28U when she moved basal insulin at bedtime. Highest 600 >> ... >> 260s >> 200s.  She is has seen nutrition in the past.  - + HL. Last lipids: Lab Results  Component Value Date   CHOL 215 (H) 06/25/2016   HDL 58.50 06/25/2016   LDLCALC 198 (H) 01/09/2015   LDLDIRECT 122.0 06/25/2016   TRIG 296.0 (H) 06/25/2016   CHOLHDL 4 06/25/2016  On Crestor 40. 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 - Last eye appt: 07/2017: No DR reportedly.  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  Review of Systems Constitutional: no weight gain/no weight loss, no fatigue, + hot flashes, no subjective hypothermia, + nocturia Eyes: no blurry vision, no xerophthalmia ENT: no sore throat, no nodules palpated in throat, no dysphagia, no odynophagia, no hoarseness Cardiovascular: no CP/no SOB/no palpitations/no leg swelling Respiratory: + Cough/no SOB/no wheezing Gastrointestinal: no N/no V/no D/no C/+ acid reflux Musculoskeletal: no muscle aches/no joint aches Skin: no rashes, no hair loss Neurological: no tremors/no numbness/no tingling/no dizziness, + headache  I reviewed pt's medications, allergies, PMH, social hx, family hx, and changes were documented in the history of present illness. Otherwise, unchanged from my initial visit note.   Objective:   Physical Exam BP 108/70   Pulse 98   Ht 5\' 1"  (1.549 m)   Wt 141 lb (64 kg)   SpO2 99%   BMI 26.64 kg/m  Body mass index is 26.64 kg/m. Wt Readings from Last 3 Encounters:  11/23/17 141 lb (64  kg)  10/26/17 142 lb (64.4 kg)  09/28/17 141 lb (64 kg)   Constitutional: overweight, in NAD Eyes: PERRLA, EOMI, no exophthalmos ENT: moist mucous membranes, no thyromegaly, no cervical lymphadenopathy Cardiovascular: Tachycardia, R, No MRG Respiratory: CTA B Gastrointestinal: abdomen soft, NT, ND, BS+ Musculoskeletal: no deformities, strength intact in all 4 Skin: moist, warm, no rashes Neurological: no tremor with outstretched hands, DTR normal in all 4  Assessment:     1. DM2, uncontrolled, insulin-dependent, with probable complications - ? peripheral neuropathy  2. HL  Plan:     1. Patient with uncontrolled diabetes, with high blood sugars, previously on  Trulicity, which she had to stop due to severe GERD.  She continues on long-acting insulin, which we moved to a.m. since she was forgetting to take the dose at night.  At last visit, we also continued Januvia and metformin ER and we discussed about starting repaglinide before each meal to help with postprandial hyperglycemia.  Of note, she could not tolerate a GLP-1 receptor agonist, an SGL 2 inhibitor, oral sulfonylurea in the past.  At that time, she was having occasional sugars in the 200s, symptomatic. - At this visit, sugars are worse.  She mentions that if she takes her regimen as prescribed, she usually develops lows.  However, since last visit, she moved Lantus from a.m. back to bedtime and that is when she developed the lows.  She does not remember why she started to take it at bedtime.  In the last several days, she moved it back in the morning and she did not have any lows, but sugars in the morning approach 200s.  After last visit, I sent her a message recommending Prandin before meals.  She read the message but did not start Prandin as she forgot...  - I suggested to stop Januvia and start Ozempic.  She had nausea with Trulicity and Bydureon, but this is not necessarily a class effect for GLP-1 receptor agonist, and she agrees to try Ozempic we will started a low dose and advance as tolerated.  We will stop Januvia  before the second dose of Ozempic. - I advised her to: Patient Instructions  Please continue: - Lantus 36 units in am - Metformin ER 100 mg 2x a day with meals  Please start: - Ozempic 0.25 mg weekly in am for 3 weeks, then increase to 0.5 mg weekly  Stop Januvia before the second dose of Ozempic.  Please return in 3 months with your sugar log.   - today, HbA1c is 12.7 (much higher %  - continue checking sugars at different times of the day - check 2x a day, rotating checks - advised for yearly eye exams >> she is UTD - Return to clinic in 3 mo with sugar log   2. HL -  Reviewed latest lipid panel from 06/2016: LDL high, but improved - Her statin was then changed to Crestor 40 mg daily - She is due for another lipid panel.  If LDL not better, she may be a candidate for PCSK9 inhibitors.  Labs RN she will stop by PCPs office to get them checked.   Philemon Kingdom, MD PhD [facility]

## 2017-11-23 NOTE — Addendum Note (Signed)
Addended by: Drucilla Schmidt on: 11/23/2017 11:59 AM   Modules accepted: Orders

## 2017-11-25 ENCOUNTER — Other Ambulatory Visit: Payer: Self-pay

## 2017-11-25 ENCOUNTER — Emergency Department (HOSPITAL_COMMUNITY): Payer: 59

## 2017-11-25 ENCOUNTER — Encounter (HOSPITAL_COMMUNITY): Payer: Self-pay

## 2017-11-25 ENCOUNTER — Emergency Department (HOSPITAL_COMMUNITY)
Admission: EM | Admit: 2017-11-25 | Discharge: 2017-11-25 | Disposition: A | Discharge status: Home / Self Care | Payer: 59 | Attending: Emergency Medicine | Admitting: Emergency Medicine

## 2017-11-25 DIAGNOSIS — E119 Type 2 diabetes mellitus without complications: Secondary | ICD-10-CM | POA: Diagnosis not present

## 2017-11-25 DIAGNOSIS — J45909 Unspecified asthma, uncomplicated: Secondary | ICD-10-CM | POA: Insufficient documentation

## 2017-11-25 DIAGNOSIS — R079 Chest pain, unspecified: Secondary | ICD-10-CM | POA: Diagnosis not present

## 2017-11-25 DIAGNOSIS — R0789 Other chest pain: Secondary | ICD-10-CM | POA: Insufficient documentation

## 2017-11-25 DIAGNOSIS — Z79899 Other long term (current) drug therapy: Secondary | ICD-10-CM | POA: Insufficient documentation

## 2017-11-25 DIAGNOSIS — Z794 Long term (current) use of insulin: Secondary | ICD-10-CM | POA: Diagnosis not present

## 2017-11-25 DIAGNOSIS — Z7982 Long term (current) use of aspirin: Secondary | ICD-10-CM | POA: Diagnosis not present

## 2017-11-25 LAB — BASIC METABOLIC PANEL
Anion gap: 7 (ref 5–15)
BUN: 11 mg/dL (ref 6–20)
CO2: 26 mmol/L (ref 22–32)
Calcium: 9.4 mg/dL (ref 8.9–10.3)
Chloride: 103 mmol/L (ref 101–111)
Creatinine, Ser: 0.62 mg/dL (ref 0.44–1.00)
GFR calc Af Amer: >60 mL/min (ref >60)
GFR calc non Af Amer: >60 mL/min (ref >60)
Glucose, Bld: 243 mg/dL — ABNORMAL HIGH (ref 65–99)
Potassium: 3.9 mmol/L (ref 3.5–5.1)
Sodium: 136 mmol/L (ref 135–145)

## 2017-11-25 LAB — I-STAT TROPONIN, ED
Troponin i, poc: 0 ng/mL (ref 0.00–0.08)
Troponin i, poc: 0.01 ng/mL (ref 0.00–0.08)

## 2017-11-25 LAB — CBC
HCT: 37.1 % (ref 36.0–46.0)
Hemoglobin: 11.6 g/dL — ABNORMAL LOW (ref 12.0–15.0)
MCH: 26.1 pg (ref 26.0–34.0)
MCHC: 31.3 g/dL (ref 30.0–36.0)
MCV: 83.4 fL (ref 78.0–100.0)
Platelets: 374 10*3/uL (ref 150–400)
RBC: 4.45 MIL/uL (ref 3.87–5.11)
RDW: 13 % (ref 11.5–15.5)
WBC: 7.4 10*3/uL (ref 4.0–10.5)

## 2017-11-25 MED ORDER — ACETAMINOPHEN 500 MG PO TABS: 1000.0000 mg | ORAL_TABLET | Freq: Once | ORAL | Status: DC

## 2017-11-25 MED ORDER — GI COCKTAIL ~~LOC~~
30.0000 mL | Freq: Once | ORAL | Status: AC
  Administered 2017-11-25: 30 mL via ORAL
  Filled 2017-11-25: qty 30

## 2017-11-25 NOTE — ED Notes (Signed)
Pt agitated and feels she has been waiting for over an hour and people are being seen before her. This tech explained our process and thanked her for waiting.

## 2017-11-25 NOTE — ED Provider Notes (Signed)
[facility] EMERGENCY DEPARTMENT Provider Note   CSN: [id number] Arrival date & time: 11/25/17  1009     History   Chief Complaint No chief complaint on file.   HPI Rachel [name] is a 52 y.o. female presenting for evaluation of chest pain.  Patient states she woke up at 4:00 this morning with left/central chest pain.  It is described as a burning.  Pain went away without intervention, and returned around 10:00.  Pain is constant, nothing makes it better or worse.  There is no associated shortness of breath, nausea, vomiting.  It is not worse with inspiration.  No worsening of pain with movement of her arms.  She has not taken anything for pain including Tylenol or ibuprofen.  She states she has had chest pain like this in the past, had a cardiac cath which was clean.  She has not seen a cardiologist in several years, she has not had need to.  She denies recent fevers, chills, sore throat, cough, abdominal pain, urinary symptoms, abnormal bowel movements.  She denies leg pain or swelling.  She denies recent travel, surgeries, immobilization, trauma, history of cancer, history of prior PE/DVT, or hormone use.  Has a history of diabetes and hyperlipidemia, for which she takes medication.  No other medical problems.  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  No change in activity recently.  HPI  Past Medical History:  Diagnosis Date  . ALLERGIC RHINITIS 10/04/2007  . Anemia 01/21/2011  . ASTHMA 08/02/2007   INHALERS ONLY IN [place]  . Blood transfusion without reported diagnosis    with first child   . COMMON MIGRAINE 05/15/2009  . DIABETES MELLITUS, TYPE II 08/02/2007  . GERD 08/02/2007  . HYPERLIPIDEMIA 08/02/2007  . INSOMNIA-SLEEP DISORDER-UNSPEC 01/04/2008  . INTERMITTENT VERTIGO 05/15/2009  . LIBIDO, DECREASED 01/09/2010    Patient Active Problem List   Diagnosis Date Noted  . Orthostasis 05/11/2017  . Cervicogenic headache 04/27/2017  .  Nonallopathic lesion of cervical region 04/27/2017  . Rheumatoid factor positive 02/10/2016  . Myalgia and myositis 01/08/2016  . Low back pain 12/23/2015  . Left shoulder pain 12/23/2015  . Nonallopathic lesion of lumbosacral region 07/29/2015  . Nonallopathic lesion of sacral region 07/29/2015  . Nonallopathic lesion of thoracic region 07/29/2015  . Loss of weight 06/12/2014  . Dysphagia, pharyngoesophageal phase 06/12/2014  . Headache(784.0) 12/18/2012  . Recurrent falls 06/16/2012  . Chest pain 06/16/2012  . Back pain 03/06/2012  . Syncope and collapse 06/18/2011  . Fatigue 01/21/2011  . Microcytic anemia 01/21/2011  . Cervical radiculopathy 01/21/2011  . Preventative health care 10/11/2010  . Depression 01/09/2010  . LIBIDO, DECREASED 01/09/2010  . Migraine without aura 05/15/2009  . INTERMITTENT VERTIGO 05/15/2009  . INSOMNIA-SLEEP DISORDER-UNSPEC 01/04/2008  . Allergic rhinitis 10/04/2007  . Type 2 diabetes mellitus with hyperglycemia, with long-term current use of insulin ([place]) 08/02/2007  . Hyperlipidemia 08/02/2007  . Asthma 08/02/2007  . GERD 08/02/2007    Past Surgical History:  Procedure Laterality Date  . CESAREAN SECTION     x 3  . CYST EXCISION     left knee  . PAROTIDECTOMY  10/21/2011   Procedure: PAROTIDECTOMY;  Surgeon: Melida Quitter, MD;  Location: [facility];  Service: ENT;  Laterality: Left;     OB History   None      Home Medications    Prior to Admission medications   Medication Sig Start Date End Date Taking? Authorizing Provider  ACCU-CHEK FASTCLIX LANCETS MISC USE TO CHECK BLOOD SUGARS TWICE TIMES A DAY 06/28/17   Biagio Borg, MD  ACCU-CHEK GUIDE test strip USE TO CHECK BLOOD SUGARS TWICE A DAY DX E11.9 06/28/17   Biagio Borg, MD  aspirin 81 MG EC tablet Take 81 mg by mouth daily.      [provider]  atenolol (TENORMIN) 25 MG tablet Take 1 tablet (25 mg total) by mouth daily. 06/28/17   Pieter Partridge, DO  Blood Glucose Monitoring  Suppl (ACCU-CHEK GUIDE) w/Device KIT 1 Device by Does not apply route 2 (two) times daily. Use to check blood sugars twice a day Dx. E11.9 08/11/16   Biagio Borg, MD  butalbital-acetaminophen-caffeine (FIORICET, ESGIC) [phone] MG tablet TAKE 1 TABLET BY MOUTH EVERY 6 HOURS AS NEEDED FOR HEADACHE *MAX OF 1 OR 2 TABLETS PER DAY* 05/11/17   Biagio Borg, MD  DENTA 5000 PLUS 1.1 % CREA dental cream  08/05/16   [provider]  divalproex (DEPAKOTE ER) 500 MG 24 hr tablet Take 1 tablet (500 mg total) by mouth at bedtime. 07/26/16   Pieter Partridge, DO  eletriptan (RELPAX) 40 MG tablet Take 1 tablet earliest onset of headache.  May repeat once in 2 hours if headache persists or recurs. 06/28/17   Tomi Likens, Adam R, DO  esomeprazole (NEXIUM) 40 MG capsule TAKE 1 CAPSULE BY MOUTH 2 TIMES DAILY BEFORE A MEAL. 10/27/17   Biagio Borg, MD  gabapentin (NEURONTIN) 300 MG capsule nightly 09/28/17   Lyndal Pulley, DO  Insulin Glargine (LANTUS SOLOSTAR) 100 UNIT/ML Solostar Pen Inject 36 units in the morning. 05/30/17   Philemon Kingdom, MD  Insulin Pen Needle (UNIFINE PENTIPS) 32G X 4 MM MISC USE 1x a day 09/01/16   Philemon Kingdom, MD  JANUVIA 100 MG tablet TAKE 1 TABLET (100 MG TOTAL) BY MOUTH DAILY. 06/28/17   Biagio Borg, MD  meloxicam (MOBIC) 15 MG tablet Take 1 tablet (15 mg total) by mouth daily. 08/31/17   Lyndal Pulley, DO  metFORMIN (GLUCOPHAGE-XR) 500 MG 24 hr tablet TAKE 2 TABLETS BY MOUTH 2 TIMES DAILY WITH A MEAL. 10/04/17   Biagio Borg, MD  methocarbamol (ROBAXIN) 500 MG tablet Take 1 tablet (500 mg total) by mouth 2 (two) times daily as needed for muscle spasms. 12/03/15   Biagio Borg, MD  OnabotulinumtoxinA (BOTOX IJ) Inject 1 each as directed every 3 (three) months.    [provider]  predniSONE (STERAPRED UNI-PAK 21 TAB) 10 MG (21) TBPK tablet As directed 10/07/17   Metta Clines R, DO  rosuvastatin (CRESTOR) 40 MG tablet Take 1 tablet (40 mg total) by mouth daily. 06/25/16    Biagio Borg, MD  Semaglutide (OZEMPIC) 0.25 or 0.5 MG/DOSE SOPN Inject 0.5 mg into the skin once a week. 11/23/17   Philemon Kingdom, MD  SUMAtriptan Succinate (ZEMBRACE SYMTOUCH) 3 MG/0.5ML SOAJ Inject 1 Device into the skin once. May repeat dose once after 1 hour if headache persists or returns (do not exceed two injections in 24 hours) 05/12/16 05/12/16  Pieter Partridge, DO  traMADol (ULTRAM) 50 MG tablet TAKE 1 TABLET BY MOUTH EVERY 8 HOURS AS NEEDED 05/27/17   Biagio Borg, MD    Family History Family History  Problem Relation Age of Onset  . Hypertension Father   . Diabetes Father   . Asthma Father   . Cervical cancer Maternal Aunt   . Heart disease Maternal Aunt   .  Anesthesia problems Neg Hx   . Colon cancer Neg Hx     Social History Social History   Tobacco Use  . Smoking status: Never Smoker  . Smokeless tobacco: Never Used  Substance Use Topics  . Alcohol use: No    Alcohol/week: 0.0 oz  . Drug use: No     Allergies   Lipitor [atorvastatin] and Nitroglycerin   Review of Systems Review of Systems  Respiratory: Negative for shortness of breath.   Cardiovascular: Positive for chest pain.  All other systems reviewed and are negative.    Physical Exam Updated Vital Signs BP 128/88   Pulse 85   Temp 98.2 F (36.8 C) (Oral)   Resp 19   Ht _0  (1.575 m)   Wt 62.6 kg (138 lb)   SpO2 100%   BMI 25.24 kg/m   Physical Exam  Constitutional: She is oriented to person, place, and time. She appears well-developed and well-nourished. No distress.  Appears in no distress  HENT:  Head: Normocephalic and atraumatic.  Eyes: Pupils are equal, round, and reactive to light. Conjunctivae and EOM are normal.  Neck: Normal range of motion. Neck supple.  Cardiovascular: Normal rate, regular rhythm and intact distal pulses.  Pulmonary/Chest: Effort normal and breath sounds normal. No respiratory distress. She has no wheezes. She exhibits tenderness.  Pain is  reproducible to palpation of the left and central chest wall.  Clear lung sounds in all fields.  Abdominal: Soft. She exhibits no distension and no mass. There is no tenderness. There is no guarding.  Musculoskeletal: Normal range of motion. She exhibits no edema or tenderness.  No leg pain or swelling.  Radial and pedal pulses intact bilaterally.  Neurological: She is alert and oriented to person, place, and time.  Skin: Skin is warm and dry.  Psychiatric: She has a normal mood and affect.  Nursing note and vitals reviewed.    ED Treatments / Results  Labs (all labs ordered are listed, but only abnormal results are displayed) Labs Reviewed  BASIC METABOLIC PANEL - Abnormal; Notable for the following components:      Result Value   Glucose, Bld 243 (*)    All other components within normal limits  CBC - Abnormal; Notable for the following components:   Hemoglobin 11.6 (*)    All other components within normal limits  I-STAT TROPONIN, ED  I-STAT TROPONIN, ED    EKG EKG Interpretation  Date/Time:  Friday November 25 2017 10:14:43 EDT Ventricular Rate:  100 PR Interval:  136 QRS Duration: 74 QT Interval:  336 QTC Calculation: 433 R Axis:   109 Text Interpretation:  Normal sinus rhythm Rightward axis Nonspecific T wave abnormality Abnormal ECG nonspecific T waves similar to Jan 2016 Confirmed by Sherwood Gambler [phone]) on 11/25/2017 12:18:44 PM   Radiology Dg Chest 2 View  Result Date: 11/25/2017 CLINICAL DATA:  Chest pain. EXAM: CHEST - 2 VIEW COMPARISON:  Radiographs 07/01/2014. FINDINGS: There are lower lung volumes with mild vascular crowding and atelectasis at both lung bases. There is no confluent airspace opacity, edema, pleural effusion or pneumothorax. The heart size and mediastinal contours are stable. The bones appear unremarkable. IMPRESSION: Suboptimal inspiration.  No acute cardiopulmonary process. Electronically Signed   By: Richardean Sale M.D.   On: 11/25/2017 10:50      Procedures Procedures (including critical care time)  Medications Ordered in ED Medications  acetaminophen (TYLENOL) tablet 1,000 mg (has no administration in time range)  gi cocktail (Maalox,Lidocaine,Donnatal) (  30 mLs Oral Given 11/25/17 1323)     Initial Impression / Assessment and Plan / ED Course  I have reviewed the triage vital signs and the nursing notes.  Pertinent labs & imaging results that were available during my care of the patient were reviewed by me and considered in my medical decision making (see chart for details).     Pt presenting for evaluation of chest pain.  Physical exam reassuring, she is afebrile not tachycardic.  She appears nontoxic.  Pain is reproducible with palpation of the musculature.  She is describing the pain as a burning, it resolved and then returned.  Initial work-up reassuring, troponin negative.  EKG unchanged from previous, no STEMI.  Will trial GI cocktail and obtain a second troponin.  No change with GI cocktail, will offer Tylenol for pain control.  Patient declines at this time.  Delta troponin negative.  Patient follows with Dr. Alfonse Spruce from cardiology, encouraged follow-up for further evaluation.  At this time, doubt ACS, PE, or infection. Heart score 3, low risk. Discussed with patient.  Discussed patient is to return if symptoms worsen or change.  At this time, patient appears safe for discharge.  Patient states she understands and agrees to plan.    Final Clinical Impressions(s) / ED Diagnoses   Final diagnoses:  Atypical chest pain    ED Discharge Orders    None       Franchot Heidelberg, PA-C 11/25/17 1434    Sherwood Gambler, MD 11/26/17 2153

## 2017-11-25 NOTE — Discharge Instructions (Signed)
Use tylenol as needed for pain.  Follow up with your cardiologist for further evaluation and management.  Return to the ER if you develop new pain, difficulty breathing, or any new, concerning, or worsening symptoms.

## 2017-11-25 NOTE — ED Triage Notes (Signed)
Patient cfomplains of left anterior CP since 430 am that resolved late morning and has returned. Complains of pain to left neck and left arm, no SOB, no diaphoresis. Alert and oriented

## 2017-12-02 ENCOUNTER — Ambulatory Visit: Payer: 59 | Admitting: Neurology

## 2017-12-08 ENCOUNTER — Ambulatory Visit (INDEPENDENT_AMBULATORY_CARE_PROVIDER_SITE_OTHER): Payer: 59 | Admitting: Neurology

## 2017-12-08 DIAGNOSIS — G43709 Chronic migraine without aura, not intractable, without status migrainosus: Secondary | ICD-10-CM | POA: Diagnosis not present

## 2017-12-08 MED ORDER — ONABOTULINUMTOXINA 100 UNITS IJ SOLR
155.0000 [IU] | Freq: Once | INTRAMUSCULAR | Status: AC
  Administered 2017-12-08: 155 [IU] via INTRAMUSCULAR

## 2017-12-08 NOTE — Progress Notes (Signed)
[facility]   Procedure Note Botox  Attending: Dr. Tomi Likens  Preoperative Diagnosis(es): Chronic migraine  Consent obtained from: The patient Benefits discussed included, but were not limited to decreased muscle tightness, increased joint range of motion, and decreased pain.  Risk discussed included, but were not limited pain and discomfort, bleeding, bruising, excessive weakness, venous thrombosis, muscle atrophy and dysphagia.  Anticipated outcomes of the procedure as well as he risks and benefits of the alternatives to the procedure, and the roles and tasks of the personnel to be involved, were discussed with the patient, and the patient consents to the procedure and agrees to proceed. A copy of the patient medication guide was given to the patient which explains the blackbox warning.  Patients identity and treatment sites confirmed Yes.  .  Details of Procedure: Skin was cleaned with alcohol. Prior to injection, the needle plunger was aspirated to make sure the needle was not within a blood vessel.  There was no blood retrieved on aspiration.    Following is a summary of the muscles injected  And the amount of Botulinum toxin used:  Dilution 200 units of Botox was reconstituted with 4 ml of preservative free normal saline. Time of reconstitution: At the time of the office visit (<30 minutes prior to injection)   Injections  155 total units of Botox was injected with a 30 gauge needle.  Injection Sites: L occipitalis: 15 units- 3 sites  R occiptalis: 15 units- 3 sites  L upper trapezius: 15 units- 3 sites R upper trapezius: 15 units- 3 sits          L paraspinal: 10 units- 2 sites R paraspinal: 10 units- 2 sites  Face L frontalis(2 injection sites):10 units   R frontalis(2 injection sites):10 units         L corrugator: 5 units   R corrugator: 5 units           Procerus: 5 units   L temporalis: 20 units R temporalis: 20 units   Agent:  200 units of botulinum Type A  (Onobotulinum Toxin type A) was reconstituted with 4 ml of preservative free normal saline.  Time of reconstitution: At the time of the office visit (<30 minutes prior to injection)     Total injected (Units): 155  Total wasted (Units): none wasted  Patient tolerated procedure well without complications.   Reinjection is anticipated in 3 months. Return to clinic in 4.5 months.

## 2017-12-13 ENCOUNTER — Other Ambulatory Visit: Payer: Self-pay | Admitting: Family Medicine

## 2017-12-13 NOTE — Telephone Encounter (Signed)
Refill done.  

## 2017-12-20 ENCOUNTER — Other Ambulatory Visit: Payer: Self-pay

## 2017-12-20 ENCOUNTER — Encounter: Payer: Self-pay | Admitting: Internal Medicine

## 2017-12-20 ENCOUNTER — Other Ambulatory Visit: Payer: Self-pay | Admitting: Internal Medicine

## 2017-12-20 MED ORDER — INSULIN PEN NEEDLE 32G X 4 MM MISC: 99 refills | Status: DC

## 2017-12-20 MED ORDER — INSULIN LISPRO 100 UNIT/ML (KWIKPEN): 15.0000 [IU] | PEN_INJECTOR | Freq: Three times a day (TID) | SUBCUTANEOUS | 5 refills | Status: DC

## 2017-12-20 MED ORDER — INSULIN ASPART 100 UNIT/ML FLEXPEN: 6.0000 [IU] | PEN_INJECTOR | Freq: Three times a day (TID) | SUBCUTANEOUS | 11 refills | Status: DC

## 2017-12-20 NOTE — Progress Notes (Signed)
[name] [address], [place] [address] Phone: [phone] Subjective:     CC: Neck pain follow-up  HKV:[id number]  Rachel [name] is a 52 y.o. female coming in with complaint of neck pain and headache.  Patient has had this for quite some time.  Patient states that this seems to be worsening recently.  States that the headache is been there for 3 days.  Given her significant discomfort.     Past Medical History:  Diagnosis Date  . ALLERGIC RHINITIS 10/04/2007  . Anemia 01/21/2011  . ASTHMA 08/02/2007   INHALERS ONLY IN [place]  . Blood transfusion without reported diagnosis    with first child   . COMMON MIGRAINE 05/15/2009  . DIABETES MELLITUS, TYPE II 08/02/2007  . GERD 08/02/2007  . HYPERLIPIDEMIA 08/02/2007  . INSOMNIA-SLEEP DISORDER-UNSPEC 01/04/2008  . INTERMITTENT VERTIGO 05/15/2009  . LIBIDO, DECREASED 01/09/2010   Past Surgical History:  Procedure Laterality Date  . CESAREAN SECTION     x 3  . CYST EXCISION     left knee  . PAROTIDECTOMY  10/21/2011   Procedure: PAROTIDECTOMY;  Surgeon: Melida Quitter, MD;  Location: [facility] OR;  Service: ENT;  Laterality: Left;   Social History   Socioeconomic History  . Marital status: Married    Spouse name: [name]  . Number of children: 3  . Years of education: Degree  . Highest education level: Not on file  Occupational History  . Occupation: Research scientist (life sciences): [employer]  . Occupation: Chartered certified accountant    Employer: [employer]  . Financial resource strain: Not on file  . Food insecurity:    Worry: Not on file    Inability: Not on file  . Transportation needs:    Medical: Not on file    Non-medical: Not on file  Tobacco Use  . Smoking status: Never Smoker  . Smokeless tobacco: Never Used  Substance and Sexual Activity  . Alcohol use: No    Alcohol/week: 0.0 oz  . Drug use: No  . Sexual activity: Yes  Lifestyle  . Physical activity:    Days per week: Not on  file    Minutes per session: Not on file  . Stress: Not on file  Relationships  . Social connections:    Talks on phone: Not on file    Gets together: Not on file    Attends religious service: Not on file    Active member of club or organization: Not on file    Attends meetings of clubs or organizations: Not on file    Relationship status: Not on file  Other Topics Concern  . Not on file  Social History Narrative   She has 3 daughters.   Patient has a 4 year degree.    Patient working at [employer].    Patient is married to Franklin.   Allergies  Allergen Reactions  . Lipitor [Atorvastatin]     REACTION: myalgias  . Nitroglycerin    Family History  Problem Relation Age of Onset  . Hypertension Father   . Diabetes Father   . Asthma Father   . Cervical cancer Maternal Aunt   . Heart disease Maternal Aunt   . Anesthesia problems Neg Hx   . Colon cancer Neg Hx      Past medical history, social, surgical and family history all reviewed in electronic medical record.  No pertanent information unless stated regarding  to the chief complaint.   Review of Systems:Review of systems updated and as accurate as of 12/21/17  No headache, visual changes, nausea, vomiting, diarrhea, constipation, dizziness, abdominal pain, skin rash, fevers, chills, night sweats, weight loss, swollen lymph nodes, body aches, joint swelling, muscle aches, chest pain, shortness of breath, mood changes.   Objective  Blood pressure 102/80, pulse 96, height 5\' 2"  (1.575 m), weight 147 lb (66.7 kg), SpO2 98 %. Systems examined below as of 12/21/17   General: No apparent distress alert and oriented x3 mood and affect normal, dressed appropriately.  HEENT: Pupils equal, extraocular movements intact  Respiratory: Patient's speak in full sentences and does not appear short of breath  Cardiovascular: No lower extremity edema, non tender, no erythema  Skin: Warm dry intact with no signs of infection or rash on  extremities or on axial skeleton.  Abdomen: Soft nontender  Neuro: Cranial nerves II through XII are intact, neurovascularly intact in all extremities with 2+ DTRs and 2+ pulses.  Lymph: No lymphadenopathy of posterior or anterior cervical chain or axillae bilaterally.  Gait normal with good balance and coordination.  MSK:  Non tender with full range of motion and good stability and symmetric strength and tone of shoulders, elbows, wrist, hip, knee and ankles bilaterally.  Neck: Inspection mild loss of lordosis. No palpable stepoffs. Negative Spurling's maneuver. Full neck range of motion Grip strength and sensation normal in bilateral hands Strength good C4 to T1 distribution No sensory change to C4 to T1 Negative Hoffman sign bilaterally Reflexes normal Tightness of the trapezius bilaterally  Osteopathic findings C2 flexed rotated and side bent left  T3 extended rotated and side bent right inhaled third rib T6 extended rotated and side bent left L2 flexed rotated and side bent right Sacrum right on right    Impression and Recommendations:     This case required medical decision making of moderate complexity.      Note: This dictation was prepared with Dragon dictation along with smaller phrase technology. Any transcriptional errors that result from this process are unintentional.

## 2017-12-21 ENCOUNTER — Ambulatory Visit (INDEPENDENT_AMBULATORY_CARE_PROVIDER_SITE_OTHER): Payer: 59 | Admitting: Family Medicine

## 2017-12-21 ENCOUNTER — Encounter: Payer: Self-pay | Admitting: Family Medicine

## 2017-12-21 VITALS — BP 102/80 | HR 96 | Ht 62.0 in | Wt 147.0 lb

## 2017-12-21 DIAGNOSIS — M9900 Segmental and somatic dysfunction of head region: Secondary | ICD-10-CM | POA: Diagnosis not present

## 2017-12-21 DIAGNOSIS — M9903 Segmental and somatic dysfunction of lumbar region: Secondary | ICD-10-CM

## 2017-12-21 DIAGNOSIS — M9904 Segmental and somatic dysfunction of sacral region: Secondary | ICD-10-CM

## 2017-12-21 DIAGNOSIS — M9902 Segmental and somatic dysfunction of thoracic region: Secondary | ICD-10-CM | POA: Diagnosis not present

## 2017-12-21 DIAGNOSIS — M9908 Segmental and somatic dysfunction of rib cage: Secondary | ICD-10-CM | POA: Diagnosis not present

## 2017-12-21 DIAGNOSIS — R51 Headache: Secondary | ICD-10-CM | POA: Diagnosis not present

## 2017-12-21 DIAGNOSIS — M2550 Pain in unspecified joint: Secondary | ICD-10-CM

## 2017-12-21 DIAGNOSIS — G4486 Cervicogenic headache: Secondary | ICD-10-CM

## 2017-12-21 DIAGNOSIS — M999 Biomechanical lesion, unspecified: Secondary | ICD-10-CM

## 2017-12-21 HISTORY — DX: Biomechanical lesion, unspecified: M99.9

## 2017-12-21 MED ORDER — KETOROLAC TROMETHAMINE 60 MG/2ML IM SOLN
60.0000 mg | Freq: Once | INTRAMUSCULAR | Status: AC
  Administered 2017-12-21: 60 mg via INTRAMUSCULAR

## 2017-12-21 NOTE — Assessment & Plan Note (Signed)
Cervicogenic headaches.  We discussed icing regimen, responding well to manipulation, Toradol injection given today.  Discussed that I do think there possibly is some other underlying causes.  Patient will continue with working on posture and ergonomics.  Patient declined any significant anxiety or depression.  Follow-up again in 4 weeks

## 2017-12-21 NOTE — Patient Instructions (Signed)
Good to see you  Toradol injected today  Ice is your friend.  Stay active See me again in 4 weeks

## 2017-12-21 NOTE — Assessment & Plan Note (Signed)
Decision today to treat with OMT was based on Physical Exam  After verbal consent patient was treated with HVLA, ME, FPR techniques in cervical, thoracic, rib,  lumbar and sacral areas  Patient tolerated the procedure well with improvement in symptoms  Patient given exercises, stretches and lifestyle modifications  See medications in patient instructions if given  Patient will follow up in 4-8 weeks 

## 2017-12-23 DIAGNOSIS — Z760 Encounter for issue of repeat prescription: Secondary | ICD-10-CM | POA: Diagnosis not present

## 2017-12-27 ENCOUNTER — Other Ambulatory Visit: Payer: Self-pay | Admitting: Internal Medicine

## 2017-12-27 ENCOUNTER — Other Ambulatory Visit: Payer: Self-pay | Admitting: Family Medicine

## 2017-12-28 ENCOUNTER — Telehealth: Payer: Self-pay | Admitting: Neurology

## 2017-12-28 DIAGNOSIS — G43709 Chronic migraine without aura, not intractable, without status migrainosus: Secondary | ICD-10-CM

## 2017-12-28 NOTE — Telephone Encounter (Signed)
Called and spoke with Pt. She has used Excedrin, she used Sprix and said it doesn't really help, just burns.Pt has been started on gabapentin by another Dr.  I offered for the Pt to come tomorrow in the AM for headache cocktail, but she said she has to go to work, and I advised her would only be able to give her Toradol injection. She was unavle to afford the Relpax. Is there anything alse she can try?

## 2017-12-28 NOTE — Telephone Encounter (Signed)
Patient is having headaches and wants to talk to someone please call

## 2017-12-29 MED ORDER — ATENOLOL 25 MG PO TABS: 25.0000 mg | ORAL_TABLET | Freq: Every day | ORAL | 3 refills | Status: DC

## 2017-12-29 MED ORDER — ERGOTAMINE-CAFFEINE 1-100 MG PO TABS
ORAL_TABLET | ORAL | 2 refills | Status: DC
Start: 2017-12-29 — End: 2018-01-18

## 2017-12-29 NOTE — Telephone Encounter (Signed)
Spoke with Pt. She wants to try Cafergot. She has been out of the atenolol and did not contact us for a refill. I advised her to resume the atenolol 25mg  x1 week, then increase to 50mg . Pt is aware to monitor for lightheadedness.

## 2017-12-29 NOTE — Telephone Encounter (Signed)
Other abortive options are: 1.  Migranal 2.  Cafergot (depending if she prefers oral or nasal administration)  Also, if she is still on atenolol 25mg  daily, we can increase dose to 50mg  daily (she should monitor for lightheadedness).

## 2018-01-13 ENCOUNTER — Other Ambulatory Visit: Payer: Self-pay | Admitting: Internal Medicine

## 2018-01-13 NOTE — Telephone Encounter (Signed)
Done erx 

## 2018-01-13 NOTE — Telephone Encounter (Signed)
Routed to dr Jenny Reichmann, please advise, thanks

## 2018-01-16 NOTE — Progress Notes (Signed)
[name] [address] [place], [place] [address] Phone: [phone] Subjective:    I'm seeing this patient by the request  of:    CC: Back pain follow-up  [id number]  Rachel [name] is a 52 y.o. female coming in with complaint of back pain. States that her lower back is painful. Has been working out. Pain with running.  Patient is trying to work on a more regular basis.  Patient has responded fairly well to osteopathic manipulation previously.  Patient has been doing the vitamins intermittently.      Past Medical History:  Diagnosis Date  . ALLERGIC RHINITIS 10/04/2007  . Anemia 01/21/2011  . ASTHMA 08/02/2007   INHALERS ONLY IN [place]  . Blood transfusion without reported diagnosis    with first child   . COMMON MIGRAINE 05/15/2009  . DIABETES MELLITUS, TYPE II 08/02/2007  . GERD 08/02/2007  . HYPERLIPIDEMIA 08/02/2007  . INSOMNIA-SLEEP DISORDER-UNSPEC 01/04/2008  . INTERMITTENT VERTIGO 05/15/2009  . LIBIDO, DECREASED 01/09/2010   Past Surgical History:  Procedure Laterality Date  . CESAREAN SECTION     x 3  . CYST EXCISION     left knee  . PAROTIDECTOMY  10/21/2011   Procedure: PAROTIDECTOMY;  Surgeon: Melida Quitter, MD;  Location: [facility] OR;  Service: ENT;  Laterality: Left;   Social History   Socioeconomic History  . Marital status: Married    Spouse name: [name]  . Number of children: 3  . Years of education: Degree  . Highest education level: Not on file  Occupational History  . Occupation: Research scientist (life sciences): [employer]  . Occupation: Chartered certified accountant    Employer: [employer]  . Financial resource strain: Not on file  . Food insecurity:    Worry: Not on file    Inability: Not on file  . Transportation needs:    Medical: Not on file    Non-medical: Not on file  Tobacco Use  . Smoking status: Never Smoker  . Smokeless tobacco: Never Used  Substance and Sexual Activity  . Alcohol use: No    Alcohol/week:  0.0 oz  . Drug use: No  . Sexual activity: Yes  Lifestyle  . Physical activity:    Days per week: Not on file    Minutes per session: Not on file  . Stress: Not on file  Relationships  . Social connections:    Talks on phone: Not on file    Gets together: Not on file    Attends religious service: Not on file    Active member of club or organization: Not on file    Attends meetings of clubs or organizations: Not on file    Relationship status: Not on file  Other Topics Concern  . Not on file  Social History Narrative   She has 3 daughters.   Patient has a 4 year degree.    Patient working at [employer].    Patient is married to [place].   Allergies  Allergen Reactions  . Lipitor [Atorvastatin]     REACTION: myalgias  . Nitroglycerin    Family History  Problem Relation Age of Onset  . Hypertension Father   . Diabetes Father   . Asthma Father   . Cervical cancer Maternal Aunt   . Heart disease Maternal Aunt   . Anesthesia problems Neg Hx   . Colon cancer Neg Hx      Past medical history,  social, surgical and family history all reviewed in electronic medical record.  No pertanent information unless stated regarding to the chief complaint.   Review of Systems:Review of systems updated and as accurate as of 01/18/18  No headache, visual changes, nausea, vomiting, diarrhea, constipation, dizziness, abdominal pain, skin rash, fevers, chills, night sweats, weight loss, swollen lymph nodes, body aches, joint swelling, , chest pain, shortness of breath, mood changes.  Positive muscle aches  Objective  Blood pressure 100/70, pulse 84, height 5\' 2"  (1.575 m), weight 150 lb (68 kg), SpO2 98 %. Systems examined below as of 01/18/18   General: No apparent distress alert and oriented x3 mood and affect normal, dressed appropriately.  HEENT: Pupils equal, extraocular movements intact  Respiratory: Patient's speak in full sentences and does not appear short of breath  Cardiovascular:  No lower extremity edema, non tender, no erythema  Skin: Warm dry intact with no signs of infection or rash on extremities or on axial skeleton.  Abdomen: Soft nontender  Neuro: Cranial nerves II through XII are intact, neurovascularly intact in all extremities with 2+ DTRs and 2+ pulses.  Lymph: No lymphadenopathy of posterior or anterior cervical chain or axillae bilaterally.  Gait normal with good balance and coordination.  MSK:  Non tender with full range of motion and good stability and symmetric strength and tone of shoulders, elbows, wrist, hip, knee and ankles bilaterally.  Back Exam:  Inspection: Loss of lordosis Motion: Flexion 45 deg, Extension 25 deg, Side Bending to 35 deg bilaterally,  Rotation to 30 deg bilaterally  SLR laying: Negative  XSLR laying: Negative  Palpable tenderness: Tender to palpation the paraspinal musculature lumbar spine right greater than left. FABER: Tightness bilaterally. Sensory change: Gross sensation intact to all lumbar and sacral dermatomes.  Reflexes: 2+ at both patellar tendons, 2+ at achilles tendons, Babinski's downgoing.  Strength at foot  Plantar-flexion: 5/5 Dorsi-flexion: 5/5 Eversion: 5/5 Inversion: 5/5  Leg strength  Quad: 5/5 Hamstring: 5/5 Hip flexor: 5/5 Hip abductors: 4/5 but symmetric  Gait unremarkable.   Osteopathic findings C2 flexed rotated and side bent left  C4 flexed rotated and side bent left C6 flexed rotated and side bent right  T3 extended rotated and side bent right inhaled third rib T9 extended rotated and side bent left L2 flexed rotated and side bent right Sacrum right on right    Impression and Recommendations:     This case required medical decision making of moderate complexity.      Note: This dictation was prepared with Dragon dictation along with smaller phrase technology. Any transcriptional errors that result from this process are unintentional.

## 2018-01-17 ENCOUNTER — Telehealth: Payer: Self-pay | Admitting: Internal Medicine

## 2018-01-17 DIAGNOSIS — Z0279 Encounter for issue of other medical certificate: Secondary | ICD-10-CM

## 2018-01-17 NOTE — Telephone Encounter (Signed)
FMLA renewal has been completed & signed, Faxed to [employer] @ [phone], Copy sent to scan & charged for.   LVM to inform patient original is ready to be picked or I can mail it to her if she would like.

## 2018-01-18 ENCOUNTER — Ambulatory Visit (INDEPENDENT_AMBULATORY_CARE_PROVIDER_SITE_OTHER): Payer: 59 | Admitting: Family Medicine

## 2018-01-18 ENCOUNTER — Encounter: Payer: Self-pay | Admitting: Family Medicine

## 2018-01-18 ENCOUNTER — Ambulatory Visit (INDEPENDENT_AMBULATORY_CARE_PROVIDER_SITE_OTHER): Payer: 59 | Admitting: Neurology

## 2018-01-18 ENCOUNTER — Encounter: Payer: Self-pay | Admitting: Neurology

## 2018-01-18 VITALS — BP 92/68 | HR 88 | Ht 60.0 in | Wt 149.0 lb

## 2018-01-18 VITALS — BP 100/70 | HR 84 | Ht 62.0 in | Wt 150.0 lb

## 2018-01-18 DIAGNOSIS — G43019 Migraine without aura, intractable, without status migrainosus: Secondary | ICD-10-CM | POA: Diagnosis not present

## 2018-01-18 DIAGNOSIS — M545 Low back pain, unspecified: Secondary | ICD-10-CM

## 2018-01-18 DIAGNOSIS — M999 Biomechanical lesion, unspecified: Secondary | ICD-10-CM

## 2018-01-18 DIAGNOSIS — G8929 Other chronic pain: Secondary | ICD-10-CM

## 2018-01-18 MED ORDER — ERENUMAB-AOOE 70 MG/ML ~~LOC~~ SOAJ
70.0000 mg | SUBCUTANEOUS | 11 refills | Status: DC
Start: 2018-01-18 — End: 2018-04-26

## 2018-01-18 MED ORDER — GABAPENTIN 300 MG PO CAPS: 300.0000 mg | ORAL_CAPSULE | Freq: Two times a day (BID) | ORAL | 3 refills | Status: DC

## 2018-01-18 MED ORDER — FLURBIPROFEN 100 MG PO TABS: 100.0000 mg | ORAL_TABLET | Freq: Three times a day (TID) | ORAL | 3 refills | Status: DC | PRN

## 2018-01-18 MED ORDER — ERENUMAB-AOOE 70 MG/ML ~~LOC~~ SOAJ: 70.0000 mg | SUBCUTANEOUS | 0 refills | Status: DC

## 2018-01-18 NOTE — Patient Instructions (Signed)
Good to see you  Rachel [name] is your friend.  Keep working out you are doing great! Continue the vitamins See me again in 4-8 weeks

## 2018-01-18 NOTE — Assessment & Plan Note (Signed)
Decision today to treat with OMT was based on Physical Exam  After verbal consent patient was treated with HVLA, ME, FPR techniques in cervical, thoracic, rib,  lumbar and sacral areas  Patient tolerated the procedure well with improvement in symptoms  Patient given exercises, stretches and lifestyle modifications  See medications in patient instructions if given  Patient will follow up in 4-8 weeks 

## 2018-01-18 NOTE — Assessment & Plan Note (Signed)
Multifactorial.  Significant tightness in the neck and mid back.  Responds well to manipulation.  Discussed posture and ergonomics, patient is working on a more regular basis we discussed proper lifting techniques.  Follow-up again in 4 to 8 weeks

## 2018-01-18 NOTE — Progress Notes (Signed)
NEUROLOGY FOLLOW UP OFFICE NOTE  Rachel [name] [id number]  HISTORY OF PRESENT ILLNESS: Rachel [name] is a 52 year old right-handed female with type 2 diabetes, hyperlipidemia, GERD, asthma, intermittent vertigo, and migraine who follows up for migraine.   UPDATE: Unable to afford Relpax.  Daily headaches for past 2 months. Stopped atenolol due to low blood pressure. Intensity:  severe Duration:  All day Frequency:  daily Taking Fioricet. Current NSAIDS:  No Current analgesics:  Fioriciet.  Tramadol (for back pain, ineffective for headache) Current triptans: no Current ergotamine:  Cafergot (ineffective) Current anti-emetic:  Zofran ODT '8mg'$  Current muscle relaxants: Robaxin   Current anti-anxiolytic:  no Current sleep aide:  no Current Antihypertensive medications:  no Current Antidepressant medications:  No.  She was prescribed nortriptyline but never started it due to potential side effects of an antidepressant. Current Anticonvulsant medications:  gabapentin '300mg'$  at bedtime (for back pain) Current Vitamins/Herbal/Supplements:  turmeric Current Antihistamines/Decongestants:  no Other therapy:  Botox (Status post 4 rounds), OMM   She drinks plenty of water.  She does not drink soda or alcohol.  She drinks 1 cup of coffee daily.  She does not exercise.   HISTORY: Migraines: Onset:  2011.  She does have remote history of headaches when she was younger. Location:  Back of head and radiates to the front Quality:  Shooting up from back of head, pounding on top and front of head Initial Intensity:  8/10 Aura:  no Prodrome:  no Associated symptoms:  Nausea, photophobia, phonophobia, blurred vision (she gets annual eye exams due to diabetes) Initial Duration:  All day Initial Frequency:  Almost daily (cannot function 6 days per month) Triggers/exacerbating factors:  wine Relieving factors:  No Activity:  Cannot work about 6 days per month (she works as an Advertising account planner)   Past abortive medication:  Sumatriptan '100mg'$  (ineffective), Training and development officer (did not like feeling), Zomig '5mg'$  NS (did not like way it made her feel), Maxalt (dizziness, ineffective), Cambia (made headache worse), Excedrin, naproxen, Tylenol, Fioricet, Sprix (ketorolac) NS Past preventative medication:  topiramate, Depakote (stomach ache), venlafaxine XR '150mg'$ , atenolol Other past therapy:  none   Frequent Falls: For the past few years, she has had falls, but they have become more frequent over the past year.  Previously, she was diagnosed with vertigo.  However, she reports that she doesn't note spinning or lightheadedness.  She says she "just falls".  On one occasion, she was pushing the seat forward while sitting and just fell over.  Otherwise, it always occurs while walking or on her feet.  She denies double vision or focal numbness or weakness.  MRI of the brain with seizure protocol was performed on 08/02/11, which was unremarkable.  Sleep deprived EEG was normal.  She has had PT, which was ineffective.  She does report that she sometimes trips while walking but doesn't fall.   Cervical plain films from 05/07/15 showed mild degenerative disc disease at C5-6, but otherwise unremarkable.  Lumbar films from 06/10/15 were personally reviewed and were negative.  She underwent anothner MRI of brain with and without contrast on 02/10/16, and was stable compared to prior MRI from 2013.  NCV-EMG from 05/13/16 was negative for neuropathy or lumbosacral radiculopathy.  PAST MEDICAL HISTORY: Past Medical History:  Diagnosis Date  . ALLERGIC RHINITIS 10/04/2007  . Anemia 01/21/2011  . ASTHMA 08/02/2007   INHALERS ONLY IN [place]  . Blood transfusion without reported diagnosis    with first child   .  COMMON MIGRAINE 05/15/2009  . DIABETES MELLITUS, TYPE II 08/02/2007  . GERD 08/02/2007  . HYPERLIPIDEMIA 08/02/2007  . INSOMNIA-SLEEP DISORDER-UNSPEC 01/04/2008  . INTERMITTENT VERTIGO 05/15/2009  . LIBIDO,  DECREASED 01/09/2010    MEDICATIONS: Current Outpatient Medications on File Prior to Visit  Medication Sig Dispense Refill  . ACCU-CHEK FASTCLIX LANCETS MISC USE TO CHECK BLOOD SUGARS TWICE TIMES A DAY 102 each 3  . ACCU-CHEK GUIDE test strip USE TO CHECK BLOOD SUGARS TWICE A DAY DX E11.9 100 each 3  . aspirin 81 MG EC tablet Take 81 mg by mouth daily.      Marland Kitchen atenolol (TENORMIN) 25 MG tablet Take 1 tablet (25 mg total) by mouth daily. 30 tablet 2  . atenolol (TENORMIN) 25 MG tablet Take 1 tablet (25 mg total) by mouth daily. X 7 days, then increase to 2 tabs ('50mg'$ ) daily 60 tablet 3  . Blood Glucose Monitoring Suppl (ACCU-CHEK GUIDE) w/Device KIT 1 Device by Does not apply route 2 (two) times daily. Use to check blood sugars twice a day Dx. E11.9 1 kit 0  . DENTA 5000 PLUS 1.1 % CREA dental cream   3  . esomeprazole (NEXIUM) 40 MG capsule TAKE 1 CAPSULE BY MOUTH 2 TIMES DAILY BEFORE A MEAL. 180 capsule 0  . insulin aspart (NOVOLOG FLEXPEN) 100 UNIT/ML FlexPen Inject 6-10 Units into the skin 3 (three) times daily before meals. 15 mL 11  . Insulin Glargine (LANTUS SOLOSTAR) 100 UNIT/ML Solostar Pen INJECT 36 UNITS IN THE MORNING. 33 mL 1  . insulin lispro (HUMALOG KWIKPEN) 100 UNIT/ML KiwkPen Inject 0.15 mLs (15 Units total) into the skin 3 (three) times daily. 30 mL 5  . Insulin Pen Needle (UNIFINE PENTIPS) 32G X 4 MM MISC USE 4x a day 200 each PRN  . JANUVIA 100 MG tablet TAKE 1 TABLET (100 MG TOTAL) BY MOUTH DAILY. 90 tablet 1  . meloxicam (MOBIC) 15 MG tablet TAKE 1 TABLET (15 MG TOTAL) BY MOUTH DAILY. 90 tablet 0  . metFORMIN (GLUCOPHAGE-XR) 500 MG 24 hr tablet TAKE 2 TABLETS BY MOUTH 2 TIMES DAILY WITH A MEAL. 120 tablet 5  . methocarbamol (ROBAXIN) 500 MG tablet Take 1 tablet (500 mg total) by mouth 2 (two) times daily as needed for muscle spasms. 30 tablet 2  . naproxen (NAPROSYN) 500 MG tablet TAKE 1 TABLET BY MOUTH 2 TIMES DAILY WITH A MEAL 60 tablet 0  . OnabotulinumtoxinA (BOTOX IJ)  Inject 1 each as directed every 3 (three) months.    . predniSONE (STERAPRED UNI-PAK 21 TAB) 10 MG (21) TBPK tablet As directed 21 tablet 0  . rosuvastatin (CRESTOR) 40 MG tablet Take 1 tablet (40 mg total) by mouth daily. 90 tablet 3  . SUMAtriptan Succinate (ZEMBRACE SYMTOUCH) 3 MG/0.5ML SOAJ Inject 1 Device into the skin once. May repeat dose once after 1 hour if headache persists or returns (do not exceed two injections in 24 hours) 9 pen 0  . traMADol (ULTRAM) 50 MG tablet TAKE 1 TABLET BY MOUTH EVERY 8 HOURS AS NEEDED 60 tablet 2   No current facility-administered medications on file prior to visit.     ALLERGIES: Allergies  Allergen Reactions  . Lipitor [Atorvastatin]     REACTION: myalgias  . Nitroglycerin     FAMILY HISTORY: Family History  Problem Relation Age of Onset  . Hypertension Father   . Diabetes Father   . Asthma Father   . Cervical cancer Maternal Aunt   . Heart  disease Maternal Aunt   . Anesthesia problems Neg Hx   . Colon cancer Neg Hx     SOCIAL HISTORY: Social History   Socioeconomic History  . Marital status: Married    Spouse name: [name]  . Number of children: 3  . Years of education: Degree  . Highest education level: Not on file  Occupational History  . Occupation: Research scientist (life sciences): [employer]  . Occupation: Chartered certified accountant    Employer: [employer]  . Financial resource strain: Not on file  . Food insecurity:    Worry: Not on file    Inability: Not on file  . Transportation needs:    Medical: Not on file    Non-medical: Not on file  Tobacco Use  . Smoking status: Never Smoker  . Smokeless tobacco: Never Used  Substance and Sexual Activity  . Alcohol use: No    Alcohol/week: 0.0 oz  . Drug use: No  . Sexual activity: Yes  Lifestyle  . Physical activity:    Days per week: Not on file    Minutes per session: Not on file  . Stress: Not on file  Relationships  . Social connections:    Talks on phone:  Not on file    Gets together: Not on file    Attends religious service: Not on file    Active member of club or organization: Not on file    Attends meetings of clubs or organizations: Not on file    Relationship status: Not on file  . Intimate partner violence:    Fear of current or ex partner: Not on file    Emotionally abused: Not on file    Physically abused: Not on file    Forced sexual activity: Not on file  Other Topics Concern  . Not on file  Social History Narrative   She has 3 daughters.   Patient has a 4 year degree.    Patient working at [employer].    Patient is married to [place].    REVIEW OF SYSTEMS: Constitutional: No fevers, chills, or sweats, no generalized fatigue, change in appetite Eyes: No visual changes, double vision, eye pain Ear, nose and throat: No hearing loss, ear pain, nasal congestion, sore throat Cardiovascular: No chest pain, palpitations Respiratory:  No shortness of breath at rest or with exertion, wheezes GastrointestinaI: No nausea, vomiting, diarrhea, abdominal pain, fecal incontinence Genitourinary:  No dysuria, urinary retention or frequency Musculoskeletal:  No neck pain, back pain Integumentary: No rash, pruritus, skin lesions Neurological: as above Psychiatric: No depression, insomnia, anxiety Endocrine: No palpitations, fatigue, diaphoresis, mood swings, change in appetite, change in weight, increased thirst Hematologic/Lymphatic:  No purpura, petechiae. Allergic/Immunologic: no itchy/runny eyes, nasal congestion, recent allergic reactions, rashes  PHYSICAL EXAM: Vitals:   01/18/18 1029  BP: 92/68  Pulse: 88  SpO2: 97%   General: No acute distress.  Patient appears well-groomed.   Head:  Normocephalic/atraumatic Eyes:  Fundi examined but not visualized Neck: supple, bilateral paraspinal tenderness, full range of motion Heart:  Regular rate and rhythm Lungs:  Clear to auscultation bilaterally Back: paraspinal  tenderness Neurological Exam: alert and oriented to person, place, and time. Attention span and concentration intact, recent and remote memory intact, fund of knowledge intact.  Speech fluent and not dysarthric, language intact.  CN II-XII intact. Bulk and tone normal, muscle strength 5/5 throughout.  Sensation to light touch  intact.  Deep tendon reflexes 2+ throughout.  Finger to nose testing intact.  Gait normal, Romberg negative.  IMPRESSION: Migraine without aura, without status migrainosus, intractable  PLAN: 1.  Stop Botox.  Instead we will start Aimovig '70mg'$  self injection monthly 2.  Stop butalbital-acetaminophen-caffeine. 3.  At earliest onset of migraine, take flurbiprofen '100mg'$ .  May take every 8 hours as needed, limited to no more than '300mg'$  in 24 hours.  Limit to no more than 2 days out of week to prevent rebound headache 4.  Take muscle relaxer every night (not PRN) 5.  Follow up in 3 to 4 months  Metta Clines, DO  CC: Dr. Jenny Reichmann

## 2018-01-18 NOTE — Patient Instructions (Signed)
1.  Stop Botox.  Instead we will start Aimovig 70mg  self injection monthly 2.  Stop butalbital-acetaminophen-caffeine. 3.  At earliest onset of migraine, take flurbiprofen 100mg .  May take every 8 hours as needed, limited to no more than 300mg  in 24 hours.  Limit to no more than 2 days out of week to prevent rebound headache 4.  Take muscle relaxer everynight 5.  Follow up in 3 to 4 months

## 2018-01-19 NOTE — Progress Notes (Signed)
Initiated on cover my meds EZB:[id number] Faxed clinicals to [place] F#[phone]  (p#[phone])

## 2018-01-20 ENCOUNTER — Other Ambulatory Visit (INDEPENDENT_AMBULATORY_CARE_PROVIDER_SITE_OTHER): Payer: 59

## 2018-01-20 DIAGNOSIS — G43709 Chronic migraine without aura, not intractable, without status migrainosus: Secondary | ICD-10-CM

## 2018-01-20 MED ORDER — KETOROLAC TROMETHAMINE 60 MG/2ML IM SOLN
60.0000 mg | Freq: Once | INTRAMUSCULAR | Status: AC
  Administered 2018-01-20: 60 mg via INTRAMUSCULAR

## 2018-01-20 MED ORDER — DIPHENHYDRAMINE HCL 50 MG/ML IJ SOLN
25.0000 mg | Freq: Once | INTRAMUSCULAR | Status: AC
  Administered 2018-01-20: 25 mg via INTRAMUSCULAR

## 2018-01-20 MED ORDER — DEXTROSE 5 % IV SOLN
10.0000 mg | Freq: Once | INTRAVENOUS | Status: AC
  Administered 2018-01-20: 10 mg via INTRAMUSCULAR

## 2018-01-20 NOTE — Progress Notes (Signed)
Rcvd approval for Aimovig 70 mg. For a total of 6 fills valid 01/18/18 - 07/20/18  [facility], [facility] with [facility].

## 2018-01-30 ENCOUNTER — Telehealth: Payer: Self-pay | Admitting: Internal Medicine

## 2018-01-30 ENCOUNTER — Other Ambulatory Visit: Payer: Self-pay | Admitting: Internal Medicine

## 2018-01-30 NOTE — Telephone Encounter (Signed)
Copied from [place] [id number]. Topic: Quick Communication - Rx Refill/Question >> Jan 30, 2018 10:52 AM Rachel [name] wrote: Medication: esomeprazole (NEXIUM) 40 MG capsule  Pt is out of medcine  Has the patient contacted their pharmacy? Yes.   (Agent: If no, request that the patient contact the pharmacy for the refill.) (Agent: If yes, when and what did the pharmacy advise?) told her to call her provider  Preferred Pharmacy (with phone number or street name):   [place], [place] - [address]. [phone] (Phone) [phone] (Fax)    Agent: Please be advised that RX refills may take up to 3 business days. We ask that you follow-up with your pharmacy.

## 2018-01-30 NOTE — Telephone Encounter (Signed)
Rx refilled by office today

## 2018-02-21 NOTE — Progress Notes (Deleted)
[name] [address], [place] [address] Phone: [phone] Subjective:    I'm seeing this patient by the request  of:    CC:   [id number]  Rachel [name] is a 52 y.o. female coming in with complaint of ***  Onset-  Location Duration-  Character- Aggravating factors- Reliving factors-  Therapies tried-  Severity-     Past Medical History:  Diagnosis Date  . ALLERGIC RHINITIS 10/04/2007  . Anemia 01/21/2011  . ASTHMA 08/02/2007   INHALERS ONLY IN [place]  . Blood transfusion without reported diagnosis    with first child   . COMMON MIGRAINE 05/15/2009  . DIABETES MELLITUS, TYPE II 08/02/2007  . GERD 08/02/2007  . HYPERLIPIDEMIA 08/02/2007  . INSOMNIA-SLEEP DISORDER-UNSPEC 01/04/2008  . INTERMITTENT VERTIGO 05/15/2009  . LIBIDO, DECREASED 01/09/2010   Past Surgical History:  Procedure Laterality Date  . CESAREAN SECTION     x 3  . CYST EXCISION     left knee  . PAROTIDECTOMY  10/21/2011   Procedure: PAROTIDECTOMY;  Surgeon: Melida Quitter, MD;  Location: [facility] OR;  Service: ENT;  Laterality: Left;   Social History   Socioeconomic History  . Marital status: Married    Spouse name: [name]  . Number of children: 3  . Years of education: Degree  . Highest education level: Not on file  Occupational History  . Occupation: Research scientist (life sciences): [employer]  . Occupation: Chartered certified accountant    Employer: [employer]  . Financial resource strain: Not on file  . Food insecurity:    Worry: Not on file    Inability: Not on file  . Transportation needs:    Medical: Not on file    Non-medical: Not on file  Tobacco Use  . Smoking status: Never Smoker  . Smokeless tobacco: Never Used  Substance and Sexual Activity  . Alcohol use: No    Alcohol/week: 0.0 standard drinks  . Drug use: No  . Sexual activity: Yes  Lifestyle  . Physical activity:    Days per week: Not on file    Minutes per session: Not on file  .  Stress: Not on file  Relationships  . Social connections:    Talks on phone: Not on file    Gets together: Not on file    Attends religious service: Not on file    Active member of club or organization: Not on file    Attends meetings of clubs or organizations: Not on file    Relationship status: Not on file  Other Topics Concern  . Not on file  Social History Narrative   She has 3 daughters.   Patient has a 4 year degree.    Patient working at [employer].    Patient is married to [place].   Allergies  Allergen Reactions  . Lipitor [Atorvastatin]     REACTION: myalgias  . Nitroglycerin    Family History  Problem Relation Age of Onset  . Hypertension Father   . Diabetes Father   . Asthma Father   . Cervical cancer Maternal Aunt   . Heart disease Maternal Aunt   . Anesthesia problems Neg Hx   . Colon cancer Neg Hx     Current Outpatient Medications (Endocrine & Metabolic):  .  insulin aspart (NOVOLOG FLEXPEN) 100 UNIT/ML FlexPen, Inject 6-10 Units into the skin 3 (three) times daily before meals. .  Insulin Glargine (LANTUS SOLOSTAR)  100 UNIT/ML Solostar Pen, INJECT 36 UNITS IN THE MORNING. .  insulin lispro (HUMALOG KWIKPEN) 100 UNIT/ML KiwkPen, Inject 0.15 mLs (15 Units total) into the skin 3 (three) times daily. Marland Kitchen  JANUVIA 100 MG tablet, TAKE 1 TABLET (100 MG TOTAL) BY MOUTH DAILY. .  metFORMIN (GLUCOPHAGE-XR) 500 MG 24 hr tablet, TAKE 2 TABLETS BY MOUTH 2 TIMES DAILY WITH A MEAL. .  predniSONE (STERAPRED UNI-PAK 21 TAB) 10 MG (21) TBPK tablet, As directed  Current Outpatient Medications (Cardiovascular):  .  atenolol (TENORMIN) 25 MG tablet, Take 1 tablet (25 mg total) by mouth daily. Marland Kitchen  atenolol (TENORMIN) 25 MG tablet, Take 1 tablet (25 mg total) by mouth daily. X 7 days, then increase to 2 tabs ('50mg'$ ) daily .  rosuvastatin (CRESTOR) 40 MG tablet, Take 1 tablet (40 mg total) by mouth daily.   Current Outpatient Medications (Analgesics):  .  aspirin 81 MG EC tablet,  Take 81 mg by mouth daily.   Eduard Roux (AIMOVIG) 70 MG/ML SOAJ, Inject 70 mg into the skin every 30 (thirty) days. Eduard Roux (AIMOVIG) 70 MG/ML SOAJ, Inject 70 mg into the skin every 30 (thirty) days. .  flurbiprofen (ANSAID) 100 MG tablet, Take 1 tablet (100 mg total) by mouth every 8 (eight) hours as needed. Take no more than '300mg'$  in 24 hours .  meloxicam (MOBIC) 15 MG tablet, TAKE 1 TABLET (15 MG TOTAL) BY MOUTH DAILY. .  naproxen (NAPROSYN) 500 MG tablet, TAKE 1 TABLET BY MOUTH 2 TIMES DAILY WITH A MEAL .  SUMAtriptan Succinate (ZEMBRACE SYMTOUCH) 3 MG/0.5ML SOAJ, Inject 1 Device into the skin once. May repeat dose once after 1 hour if headache persists or returns (do not exceed two injections in 24 hours) .  traMADol (ULTRAM) 50 MG tablet, TAKE 1 TABLET BY MOUTH EVERY 8 HOURS AS NEEDED   Current Outpatient Medications (Other):  Marland Kitchen  ACCU-CHEK FASTCLIX LANCETS MISC, USE TO CHECK BLOOD SUGARS TWICE TIMES A DAY .  ACCU-CHEK GUIDE test strip, USE TO CHECK BLOOD SUGARS TWICE A DAY DX E11.9 .  Blood Glucose Monitoring Suppl (ACCU-CHEK GUIDE) w/Device KIT, 1 Device by Does not apply route 2 (two) times daily. Use to check blood sugars twice a day Dx. E11.9 .  DENTA 5000 PLUS 1.1 % CREA dental cream,  .  esomeprazole (NEXIUM) 40 MG capsule, TAKE 1 CAPSULE BY MOUTH 2 TIMES DAILY BEFORE A MEAL. Marland Kitchen  gabapentin (NEURONTIN) 300 MG capsule, Take 1 capsule (300 mg total) by mouth 2 (two) times daily. .  Insulin Pen Needle (UNIFINE PENTIPS) 32G X 4 MM MISC, USE 4x a day .  methocarbamol (ROBAXIN) 500 MG tablet, Take 1 tablet (500 mg total) by mouth 2 (two) times daily as needed for muscle spasms. .  OnabotulinumtoxinA (BOTOX IJ), Inject 1 each as directed every 3 (three) months.    Past medical history, social, surgical and family history all reviewed in electronic medical record.  No pertanent information unless stated regarding to the chief complaint.   Review of Systems:  No headache,  visual changes, nausea, vomiting, diarrhea, constipation, dizziness, abdominal pain, skin rash, fevers, chills, night sweats, weight loss, swollen lymph nodes, body aches, joint swelling, muscle aches, chest pain, shortness of breath, mood changes.   Objective  There were no vitals taken for this visit. Systems examined below as of    General: No apparent distress alert and oriented x3 mood and affect normal, dressed appropriately.  HEENT: Pupils equal, extraocular movements intact  Respiratory: Patient's speak in full sentences and does not appear short of breath  Cardiovascular: No lower extremity edema, non tender, no erythema  Skin: Warm dry intact with no signs of infection or rash on extremities or on axial skeleton.  Abdomen: Soft nontender  Neuro: Cranial nerves II through XII are intact, neurovascularly intact in all extremities with 2+ DTRs and 2+ pulses.  Lymph: No lymphadenopathy of posterior or anterior cervical chain or axillae bilaterally.  Gait normal with good balance and coordination.  MSK:  Non tender with full range of motion and good stability and symmetric strength and tone of shoulders, elbows, wrist, hip, knee and ankles bilaterally.     Impression and Recommendations:     This case required medical decision making of moderate complexity. The above documentation has been reviewed and is accurate and complete Lyndal Pulley, DO       Note: This dictation was prepared with Dragon dictation along with smaller phrase technology. Any transcriptional errors that result from this process are unintentional.

## 2018-02-22 ENCOUNTER — Ambulatory Visit: Payer: 59 | Admitting: Family Medicine

## 2018-02-22 ENCOUNTER — Ambulatory Visit: Payer: 59 | Admitting: Internal Medicine

## 2018-02-22 DIAGNOSIS — Z0289 Encounter for other administrative examinations: Secondary | ICD-10-CM

## 2018-02-22 NOTE — Progress Notes (Deleted)
Subjective:     Patient ID: Rachel [name], female   DOB: 1965-12-01, 52 y.o.   MRN: [id number]  HPI [name] is a  52 y.o. woman, returning for f/u for DM2, dx 1987, uncontrolled, insulin-dependent, with probable complications (? peripheral neuropathy). Last visit 3 months ago.  Last HbA1C:  Lab Results  Component Value Date   HGBA1C 12.7 (A) 11/23/2017   HGBA1C 9.2 02/17/2017   HGBA1C 9.2 11/11/2016  02/17/2017: HbA1c calculated from fructosamine is better, at 8.34%, but still high. Prev. 10.1%.  Of note, She came off all DM medicines for 6 mo before the HbA1c in 09/2013. She again ran out of all meds in 04/2016.  She is now  on: - Metformin ER 1000 mg 2x a day with meals - Ozempic 0.5 mg weekly - Lantus 36 units in am - Novolog We triedTrulicity 1.5 mg weekly >> worked great but had to stop b/c GERD >> stopped. We tried Jardiance 25 mg daily >> started 08/2016 >> but stopped recently 2/2 recurrent UTIs She was on Glipizide 5 mg bid - added 04/2015 >> was not taking it b/c low CBGs. We stopped Glipizide XL 5 mg in 07/2014. She had episodes of hypoglycemia with Amaryl in the past. She was on Bydureon 2 mg weekly (had nausea).  She checks her sugars 1-2 times a day: - am:   102-110, 260 >> 130s >> 134-200 - after b'fast: 158-251 >> n/c - before lunch:  n/c >> 137 >> n/c - after lunch: up to 200s - before dinner:  191, 308 >> n/c - after dinner: 166-245 >> n/c - bedtime: 140-150 >> 140-150s >> n/c She has hypoglycemia awareness in the 90s. Lowest: 38 (while on Trulicity) >> 40X >> ***. Highest 600 >> ... >> 200s >> ***.  She has seen nutrition in the past.  -+ Significant HL. 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  Review of Systems Constitutional: no weight gain/no weight loss, no fatigue, no subjective hyperthermia, no subjective hypothermia Eyes: no blurry vision, no xerophthalmia ENT: no sore throat, no nodules palpated in throat, no dysphagia, no odynophagia, no hoarseness Cardiovascular: no CP/no SOB/no palpitations/no leg swelling Respiratory: no cough/no SOB/no wheezing Gastrointestinal: no N/no V/no D/no C/no acid reflux Musculoskeletal: no muscle aches/no joint aches Skin: no rashes, no hair loss Neurological: no tremors/no numbness/no tingling/no dizziness  I reviewed pt's medications, allergies, PMH, social hx, family hx, and changes were documented in the history of present illness. Otherwise, unchanged from my initial visit note.   Objective:   Physical Exam There were no vitals taken for this visit. There is no height or weight on file to calculate BMI. Wt Readings from Last 3 Encounters:  01/18/18 149 lb (67.6 kg)  01/18/18 150 lb (68 kg)  12/21/17 147 lb (66.7 kg)   Constitutional: Normal weight, in NAD Eyes: PERRLA, EOMI, no exophthalmos ENT: moist mucous membranes, no thyromegaly, no cervical lymphadenopathy Cardiovascular: RRR, No MRG  Respiratory: CTA B Gastrointestinal: abdomen soft, NT, ND, BS+ Musculoskeletal: no deformities, strength intact in all 4 Skin: moist, warm, no rashes Neurological: no tremor with outstretched hands, DTR normal in all 4   Assessment:     1. DM2, uncontrolled, insulin-dependent, with probable complications - ? peripheral neuropathy  2. HL  Plan:     1. Patient with controlled diabetes, with continuing poor control, despite addition of Ozempic at last visit.  She had been on Trulicity and Bydureon in the past, which she had to stop due to severe  GERD.  However, since her sugars were very high at last visit, we discussed about trying Ozempic.  She was on an SGLT2 inhibitor in the past and also sulfonylurea, but she could not tolerate these.  She did try Ozempic, but sugars remain high.  We ended up adding rapid acting insulin since last visit.  However, after she started NovoLog, she developed low blood sugars and we had to back off the doses.   - I advised her to: Patient Instructions  Please continue: - Metformin ER 1000 mg 2x a day with meals - Ozempic 0.5 mg weekly - Lantus 36 units in am - Novolog  Please return in 3 months with your sugar log.   - today, HbA1c is 7%  - continue checking sugars at different times of the day - check 2x a day, rotating checks - advised for yearly eye exams >> she is UTD - Return to clinic in 3 mo with sugar log    2. HL - Reviewed latest lipid panel from 11/2017: LDL very high, worse Lab Results  Component Value Date   CHOL 285 (H) 11/23/2017   HDL 52.20 11/23/2017   LDLCALC 194 (H) 11/23/2017   LDLDIRECT 122.0 06/25/2016   TRIG 194.0 (H) 11/23/2017   CHOLHDL 5 11/23/2017  - Continues Crestor 40 without side effects.   - She is a candidate for PCSK9 inhibitor. - Discussed about referral to the lipid clinic   Philemon Kingdom, MD PhD [facility] Endocrinology

## 2018-02-28 NOTE — Progress Notes (Deleted)
NEUROLOGY FOLLOW UP OFFICE NOTE  Rachel [name] [id number]  HISTORY OF PRESENT ILLNESS: Rachel [name] is a 52 year old right-handed female with type 2 diabetes, hyperlipidemia, GERD, asthma, intermittent BPPV, and migraine who follows up for migraine.  UPDATE: Intensity:  *** Duration:  *** Frequency:  *** Frequency of abortive medication: *** Current NSAIDS:  Flurbiprofen '100mg'$  Current analgesics:  Tramadol (for back pain) Current triptans:  no Current ergotamine:  no Current anti-emetic:  Zofran ODT '8mg'$  Current muscle relaxants:  Robaxin  Current anti-anxiolytic:  no Current sleep aide:  no Current Antihypertensive medications:  no Current Antidepressant medications:  no Current Anticonvulsant medications:  Gabapentin '300mg'$  at bedtime (back pain) Current anti-CGRP:  Aimovig '70mg'$  monthly Current Vitamins/Herbal/Supplements:  turmeric Current Antihistamines/Decongestants:  no Other therapy:  OMM Hormone/birth control:  no  Caffeine:  1 cup coffee daily Alcohol:  no Smoker:  no Diet:  Hydrates.  No soda Exercise:  no Depression:  ***; Anxiety:  *** Other pain:  Back pain Sleep hygiene:  ***   HISTORY: Migraines: Onset:  2011. She does have remote history of headaches when she was younger. Location: Back of head and radiates to the front Quality: Shooting up from back of head, pounding on top and front of head Initial Intensity: 8/10 Aura: no Prodrome: no Associated symptoms:  Nausea, photophobia, phonophobia, blurred vision (she gets annual eye exams due to diabetes).  It is not associated with vomiting, autonomic symptoms or unilateral numbness or weakness. Initial Duration: All day Initial Frequency: Almost daily (cannot function 6 days per month) Triggers/aggravating factors:  wine Relieving factors: No Activity: Cannot work about 6 days per month (she works as an Advertising account planner)  Past abortive medication: Sumatriptan '100mg'$  (ineffective),  Training and development officer (did not like feeling), Zomig '5mg'$  NS (did not like way it made her feel), Maxalt (dizziness, ineffective), Cambia (made headache worse), Excedrin, naproxen, Tylenol, Fioricet, Sprix (ketorolac) NS, Cafergot Past preventative medication: topiramate, Depakote (stomach ache), venlafaxine XR '150mg'$ , atenolol Other past therapy: none  Frequent Falls: For the past few years, she has had falls, but they have become more frequent over the past year.  Previously, she was diagnosed with vertigo.  However, she reports that she doesn't note spinning or lightheadedness.  She says she "just falls".  On one occasion, she was pushing the seat forward while sitting and just fell over.  Otherwise, it always occurs while walking or on her feet.  She denies double vision or focal numbness or weakness.  MRI of the brain with seizure protocol was performed on 08/02/11, which was unremarkable.  Sleep deprived EEG was normal.  She has had PT, which was ineffective.  She does report that she sometimes trips while walking but doesn't fall.   Cervical plain films from 05/07/15 showed mild degenerative disc disease at C5-6, but otherwise unremarkable.  Lumbar films from 06/10/15 were personally reviewed and were negative.  She underwent anothner MRI of brain with and without contrast on 02/10/16, and was stable compared to prior MRI from 2013.  NCV-EMG from 05/13/16 was negative for neuropathy or lumbosacral radiculopathy.  PAST MEDICAL HISTORY: Past Medical History:  Diagnosis Date  . ALLERGIC RHINITIS 10/04/2007  . Anemia 01/21/2011  . ASTHMA 08/02/2007   INHALERS ONLY IN [place]  . Blood transfusion without reported diagnosis    with first child   . COMMON MIGRAINE 05/15/2009  . DIABETES MELLITUS, TYPE II 08/02/2007  . GERD 08/02/2007  . HYPERLIPIDEMIA 08/02/2007  . INSOMNIA-SLEEP DISORDER-UNSPEC 01/04/2008  .  INTERMITTENT VERTIGO 05/15/2009  . LIBIDO, DECREASED 01/09/2010    MEDICATIONS: Current Outpatient  Medications on File Prior to Visit  Medication Sig Dispense Refill  . ACCU-CHEK FASTCLIX LANCETS MISC USE TO CHECK BLOOD SUGARS TWICE TIMES A DAY 102 each 3  . ACCU-CHEK GUIDE test strip USE TO CHECK BLOOD SUGARS TWICE A DAY DX E11.9 100 each 3  . aspirin 81 MG EC tablet Take 81 mg by mouth daily.      Marland Kitchen atenolol (TENORMIN) 25 MG tablet Take 1 tablet (25 mg total) by mouth daily. 30 tablet 2  . atenolol (TENORMIN) 25 MG tablet Take 1 tablet (25 mg total) by mouth daily. X 7 days, then increase to 2 tabs ('50mg'$ ) daily 60 tablet 3  . Blood Glucose Monitoring Suppl (ACCU-CHEK GUIDE) w/Device KIT 1 Device by Does not apply route 2 (two) times daily. Use to check blood sugars twice a day Dx. E11.9 1 kit 0  . DENTA 5000 PLUS 1.1 % CREA dental cream   3  . Erenumab-aooe (AIMOVIG) 70 MG/ML SOAJ Inject 70 mg into the skin every 30 (thirty) days. 1 pen 11  . Erenumab-aooe (AIMOVIG) 70 MG/ML SOAJ Inject 70 mg into the skin every 30 (thirty) days. 1 pen 0  . esomeprazole (NEXIUM) 40 MG capsule TAKE 1 CAPSULE BY MOUTH 2 TIMES DAILY BEFORE A MEAL. 180 capsule 1  . flurbiprofen (ANSAID) 100 MG tablet Take 1 tablet (100 mg total) by mouth every 8 (eight) hours as needed. Take no more than '300mg'$  in 24 hours 30 tablet 3  . gabapentin (NEURONTIN) 300 MG capsule Take 1 capsule (300 mg total) by mouth 2 (two) times daily. 60 capsule 3  . insulin aspart (NOVOLOG FLEXPEN) 100 UNIT/ML FlexPen Inject 6-10 Units into the skin 3 (three) times daily before meals. 15 mL 11  . Insulin Glargine (LANTUS SOLOSTAR) 100 UNIT/ML Solostar Pen INJECT 36 UNITS IN THE MORNING. 33 mL 1  . insulin lispro (HUMALOG KWIKPEN) 100 UNIT/ML KiwkPen Inject 0.15 mLs (15 Units total) into the skin 3 (three) times daily. 30 mL 5  . Insulin Pen Needle (UNIFINE PENTIPS) 32G X 4 MM MISC USE 4x a day 200 each PRN  . JANUVIA 100 MG tablet TAKE 1 TABLET (100 MG TOTAL) BY MOUTH DAILY. 90 tablet 1  . meloxicam (MOBIC) 15 MG tablet TAKE 1 TABLET (15 MG TOTAL)  BY MOUTH DAILY. 90 tablet 0  . metFORMIN (GLUCOPHAGE-XR) 500 MG 24 hr tablet TAKE 2 TABLETS BY MOUTH 2 TIMES DAILY WITH A MEAL. 120 tablet 5  . methocarbamol (ROBAXIN) 500 MG tablet Take 1 tablet (500 mg total) by mouth 2 (two) times daily as needed for muscle spasms. 30 tablet 2  . naproxen (NAPROSYN) 500 MG tablet TAKE 1 TABLET BY MOUTH 2 TIMES DAILY WITH A MEAL 60 tablet 0  . OnabotulinumtoxinA (BOTOX IJ) Inject 1 each as directed every 3 (three) months.    . predniSONE (STERAPRED UNI-PAK 21 TAB) 10 MG (21) TBPK tablet As directed 21 tablet 0  . rosuvastatin (CRESTOR) 40 MG tablet Take 1 tablet (40 mg total) by mouth daily. 90 tablet 3  . SUMAtriptan Succinate (ZEMBRACE SYMTOUCH) 3 MG/0.5ML SOAJ Inject 1 Device into the skin once. May repeat dose once after 1 hour if headache persists or returns (do not exceed two injections in 24 hours) 9 pen 0  . traMADol (ULTRAM) 50 MG tablet TAKE 1 TABLET BY MOUTH EVERY 8 HOURS AS NEEDED 60 tablet 2   No  current facility-administered medications on file prior to visit.     ALLERGIES: Allergies  Allergen Reactions  . Lipitor [Atorvastatin]     REACTION: myalgias  . Nitroglycerin     FAMILY HISTORY: Family History  Problem Relation Age of Onset  . Hypertension Father   . Diabetes Father   . Asthma Father   . Cervical cancer Maternal Aunt   . Heart disease Maternal Aunt   . Anesthesia problems Neg Hx   . Colon cancer Neg Hx    SOCIAL HISTORY: Social History   Socioeconomic History  . Marital status: Married    Spouse name: [name]  . Number of children: 3  . Years of education: Degree  . Highest education level: Not on file  Occupational History  . Occupation: Research scientist (life sciences): [employer]  . Occupation: Chartered certified accountant    Employer: [employer]  . Financial resource strain: Not on file  . Food insecurity:    Worry: Not on file    Inability: Not on file  . Transportation needs:    Medical: Not on file     Non-medical: Not on file  Tobacco Use  . Smoking status: Never Smoker  . Smokeless tobacco: Never Used  Substance and Sexual Activity  . Alcohol use: No    Alcohol/week: 0.0 standard drinks  . Drug use: No  . Sexual activity: Yes  Lifestyle  . Physical activity:    Days per week: Not on file    Minutes per session: Not on file  . Stress: Not on file  Relationships  . Social connections:    Talks on phone: Not on file    Gets together: Not on file    Attends religious service: Not on file    Active member of club or organization: Not on file    Attends meetings of clubs or organizations: Not on file    Relationship status: Not on file  . Intimate partner violence:    Fear of current or ex partner: Not on file    Emotionally abused: Not on file    Physically abused: Not on file    Forced sexual activity: Not on file  Other Topics Concern  . Not on file  Social History Narrative   She has 3 daughters.   Patient has a 4 year degree.    Patient working at [employer].    Patient is married to [place].    REVIEW OF SYSTEMS: Constitutional: No fevers, chills, or sweats, no generalized fatigue, change in appetite Eyes: No visual changes, double vision, eye pain Ear, nose and throat: No hearing loss, ear pain, nasal congestion, sore throat Cardiovascular: No chest pain, palpitations Respiratory:  No shortness of breath at rest or with exertion, wheezes GastrointestinaI: No nausea, vomiting, diarrhea, abdominal pain, fecal incontinence Genitourinary:  No dysuria, urinary retention or frequency Musculoskeletal:  No neck pain, back pain Integumentary: No rash, pruritus, skin lesions Neurological: as above Psychiatric: No depression, insomnia, anxiety Endocrine: No palpitations, fatigue, diaphoresis, mood swings, change in appetite, change in weight, increased thirst Hematologic/Lymphatic:  No purpura, petechiae. Allergic/Immunologic: no itchy/runny eyes, nasal congestion, recent  allergic reactions, rashes  PHYSICAL EXAM: *** General: No acute distress.  Patient appears ***-groomed.  *** body habitus. Head:  Normocephalic/atraumatic Eyes:  Fundi examined but not visualized Neck: supple, no paraspinal tenderness, full range of motion Heart:  Regular rate and rhythm Lungs:  Clear to auscultation bilaterally Back: No paraspinal tenderness  Neurological Exam: alert and oriented to person, place, and time. Attention span and concentration intact, recent and remote memory intact, fund of knowledge intact.  Speech fluent and not dysarthric, language intact.  CN II-XII intact. Bulk and tone normal, muscle strength 5/5 throughout.  Sensation to light touch  intact.  Deep tendon reflexes 2+ throughout.  Finger to nose testing intact.  Gait normal, Romberg negative.  IMPRESSION: Migraine, without aura, without status migrainosus, *** intractable  PLAN: 1.  Aimovig '70mg'$  monthly for migraine preventative 2.  *** 3.  Limit use of pain relievers to no more than 2 days out of week to prevent risk of rebound or medication-overuse headache. 4.  Keep headache diary 5.  Follow up ***  Metta Clines, DO  CC: Cathlean Cower, MD

## 2018-03-01 ENCOUNTER — Ambulatory Visit: Payer: 59 | Admitting: Neurology

## 2018-03-03 ENCOUNTER — Ambulatory Visit: Payer: 59 | Admitting: Neurology

## 2018-03-09 ENCOUNTER — Other Ambulatory Visit: Payer: Self-pay | Admitting: Internal Medicine

## 2018-03-09 NOTE — Telephone Encounter (Signed)
Done erx 

## 2018-03-09 NOTE — Telephone Encounter (Signed)
MD approved and sent electronically to [email or web address]  

## 2018-03-10 ENCOUNTER — Ambulatory Visit: Payer: 59 | Admitting: Neurology

## 2018-03-13 ENCOUNTER — Other Ambulatory Visit: Payer: Self-pay | Admitting: Internal Medicine

## 2018-03-13 NOTE — Progress Notes (Deleted)
[name] [place], [place] [address] Phone: [phone] Subjective:    I'm seeing this patient by the request  of:    CC:   [id number]  Rachel [name] is a 52 y.o. female coming in with complaint of ***  Onset-  Location Duration-  Character- Aggravating factors- Reliving factors-  Therapies tried-  Severity-     Past Medical History:  Diagnosis Date  . ALLERGIC RHINITIS 10/04/2007  . Anemia 01/21/2011  . ASTHMA 08/02/2007   INHALERS ONLY IN [place]  . Blood transfusion without reported diagnosis    with first child   . COMMON MIGRAINE 05/15/2009  . DIABETES MELLITUS, TYPE II 08/02/2007  . GERD 08/02/2007  . HYPERLIPIDEMIA 08/02/2007  . INSOMNIA-SLEEP DISORDER-UNSPEC 01/04/2008  . INTERMITTENT VERTIGO 05/15/2009  . LIBIDO, DECREASED 01/09/2010   Past Surgical History:  Procedure Laterality Date  . CESAREAN SECTION     x 3  . CYST EXCISION     left knee  . PAROTIDECTOMY  10/21/2011   Procedure: PAROTIDECTOMY;  Surgeon: Melida Quitter, MD;  Location: [facility] OR;  Service: ENT;  Laterality: Left;   Social History   Socioeconomic History  . Marital status: Married    Spouse name: [name]  . Number of children: 3  . Years of education: Degree  . Highest education level: Not on file  Occupational History  . Occupation: Research scientist (life sciences): [employer]  . Occupation: Chartered certified accountant    Employer: [employer]  . Financial resource strain: Not on file  . Food insecurity:    Worry: Not on file    Inability: Not on file  . Transportation needs:    Medical: Not on file    Non-medical: Not on file  Tobacco Use  . Smoking status: Never Smoker  . Smokeless tobacco: Never Used  Substance and Sexual Activity  . Alcohol use: No    Alcohol/week: 0.0 standard drinks  . Drug use: No  . Sexual activity: Yes  Lifestyle  . Physical activity:    Days per week: Not on file    Minutes per session: Not on file  .  Stress: Not on file  Relationships  . Social connections:    Talks on phone: Not on file    Gets together: Not on file    Attends religious service: Not on file    Active member of club or organization: Not on file    Attends meetings of clubs or organizations: Not on file    Relationship status: Not on file  Other Topics Concern  . Not on file  Social History Narrative   She has 3 daughters.   Patient has a 4 year degree.    Patient working at [employer].    Patient is married to [place].   Allergies  Allergen Reactions  . Lipitor [Atorvastatin]     REACTION: myalgias  . Nitroglycerin    Family History  Problem Relation Age of Onset  . Hypertension Father   . Diabetes Father   . Asthma Father   . Cervical cancer Maternal Aunt   . Heart disease Maternal Aunt   . Anesthesia problems Neg Hx   . Colon cancer Neg Hx     Current Outpatient Medications (Endocrine & Metabolic):  .  insulin aspart (NOVOLOG FLEXPEN) 100 UNIT/ML FlexPen, Inject 6-10 Units into the skin 3 (three) times daily before meals. .  Insulin Glargine (LANTUS SOLOSTAR)  100 UNIT/ML Solostar Pen, INJECT 36 UNITS IN THE MORNING. .  insulin lispro (HUMALOG KWIKPEN) 100 UNIT/ML KiwkPen, Inject 0.15 mLs (15 Units total) into the skin 3 (three) times daily. Marland Kitchen  JANUVIA 100 MG tablet, TAKE 1 TABLET (100 MG TOTAL) BY MOUTH DAILY. .  metFORMIN (GLUCOPHAGE-XR) 500 MG 24 hr tablet, TAKE 2 TABLETS BY MOUTH 2 TIMES DAILY WITH A MEAL. .  predniSONE (STERAPRED UNI-PAK 21 TAB) 10 MG (21) TBPK tablet, As directed  Current Outpatient Medications (Cardiovascular):  .  atenolol (TENORMIN) 25 MG tablet, Take 1 tablet (25 mg total) by mouth daily. Marland Kitchen  atenolol (TENORMIN) 25 MG tablet, Take 1 tablet (25 mg total) by mouth daily. X 7 days, then increase to 2 tabs ('50mg'$ ) daily .  rosuvastatin (CRESTOR) 40 MG tablet, Take 1 tablet (40 mg total) by mouth daily.   Current Outpatient Medications (Analgesics):  .  aspirin 81 MG EC tablet,  Take 81 mg by mouth daily.   .  butalbital-acetaminophen-caffeine (FIORICET, ESGIC) 50-325-40 MG tablet, TAKE 1 TABLET BY MOUTH EVERY 6 HOURS AS NEEDED FOR HEADACHE (MAX OF 1 OR 2 TABLETS PER DAY) .  Erenumab-aooe (AIMOVIG) 70 MG/ML SOAJ, Inject 70 mg into the skin every 30 (thirty) days. Eduard Roux (AIMOVIG) 70 MG/ML SOAJ, Inject 70 mg into the skin every 30 (thirty) days. .  flurbiprofen (ANSAID) 100 MG tablet, Take 1 tablet (100 mg total) by mouth every 8 (eight) hours as needed. Take no more than '300mg'$  in 24 hours .  meloxicam (MOBIC) 15 MG tablet, TAKE 1 TABLET (15 MG TOTAL) BY MOUTH DAILY. .  naproxen (NAPROSYN) 500 MG tablet, TAKE 1 TABLET BY MOUTH 2 TIMES DAILY WITH A MEAL .  SUMAtriptan Succinate (ZEMBRACE SYMTOUCH) 3 MG/0.5ML SOAJ, Inject 1 Device into the skin once. May repeat dose once after 1 hour if headache persists or returns (do not exceed two injections in 24 hours) .  traMADol (ULTRAM) 50 MG tablet, TAKE 1 TABLET BY MOUTH EVERY 8 HOURS AS NEEDED   Current Outpatient Medications (Other):  Marland Kitchen  ACCU-CHEK FASTCLIX LANCETS MISC, USE TO CHECK BLOOD SUGARS TWICE TIMES A DAY .  ACCU-CHEK GUIDE test strip, USE TO CHECK BLOOD SUGARS TWICE A DAY DX E11.9 .  Blood Glucose Monitoring Suppl (ACCU-CHEK GUIDE) w/Device KIT, 1 Device by Does not apply route 2 (two) times daily. Use to check blood sugars twice a day Dx. E11.9 .  DENTA 5000 PLUS 1.1 % CREA dental cream,  .  esomeprazole (NEXIUM) 40 MG capsule, TAKE 1 CAPSULE BY MOUTH 2 TIMES DAILY BEFORE A MEAL. Marland Kitchen  gabapentin (NEURONTIN) 300 MG capsule, Take 1 capsule (300 mg total) by mouth 2 (two) times daily. .  Insulin Pen Needle (UNIFINE PENTIPS) 32G X 4 MM MISC, USE 4x a day .  methocarbamol (ROBAXIN) 500 MG tablet, Take 1 tablet (500 mg total) by mouth 2 (two) times daily as needed for muscle spasms. .  OnabotulinumtoxinA (BOTOX IJ), Inject 1 each as directed every 3 (three) months. .  ondansetron (ZOFRAN) 4 MG tablet, TAKE 1 TABLET  BY MOUTH EVERY 8 HOURS AS NEEDED FOR NAUSEA OR VOMITING.    Past medical history, social, surgical and family history all reviewed in electronic medical record.  No pertanent information unless stated regarding to the chief complaint.   Review of Systems:  No headache, visual changes, nausea, vomiting, diarrhea, constipation, dizziness, abdominal pain, skin rash, fevers, chills, night sweats, weight loss, swollen lymph nodes, body aches, joint swelling, muscle  aches, chest pain, shortness of breath, mood changes.   Objective  There were no vitals taken for this visit. Systems examined below as of    General: No apparent distress alert and oriented x3 mood and affect normal, dressed appropriately.  HEENT: Pupils equal, extraocular movements intact  Respiratory: Patient's speak in full sentences and does not appear short of breath  Cardiovascular: No lower extremity edema, non tender, no erythema  Skin: Warm dry intact with no signs of infection or rash on extremities or on axial skeleton.  Abdomen: Soft nontender  Neuro: Cranial nerves II through XII are intact, neurovascularly intact in all extremities with 2+ DTRs and 2+ pulses.  Lymph: No lymphadenopathy of posterior or anterior cervical chain or axillae bilaterally.  Gait normal with good balance and coordination.  MSK:  Non tender with full range of motion and good stability and symmetric strength and tone of shoulders, elbows, wrist, hip, knee and ankles bilaterally.     Impression and Recommendations:     This case required medical decision making of moderate complexity. The above documentation has been reviewed and is accurate and complete Lyndal Pulley, DO       Note: This dictation was prepared with Dragon dictation along with smaller phrase technology. Any transcriptional errors that result from this process are unintentional.

## 2018-03-15 ENCOUNTER — Ambulatory Visit: Payer: 59 | Admitting: Family Medicine

## 2018-03-21 ENCOUNTER — Ambulatory Visit: Payer: 59 | Admitting: Family Medicine

## 2018-03-22 ENCOUNTER — Telehealth: Payer: 59 | Admitting: Family

## 2018-03-22 DIAGNOSIS — K1370 Unspecified lesions of oral mucosa: Secondary | ICD-10-CM

## 2018-03-22 NOTE — Progress Notes (Signed)
Based on what you shared with me it looks like you have a serious condition that should be evaluated in a face to face office visit.  NOTE: If you entered your credit card information for this eVisit, you will not be charged. You may see a "hold" on your card for the $30 but that hold will drop off and you will not have a charge processed.  If you are having a true medical emergency please call 911.  If you need an urgent face to face visit, [facility] has four urgent care centers for your convenience.  If you need care fast and have a high deductible or no insurance consider:   [email or web address] to reserve your spot online an avoid wait times  [facility] [address], [address] [place], [place] [address] 8 am to 8 pm Monday-Friday 10 am to 4 pm Saturday-Sunday *Across the street from [employer]  [facility]  [address] [place], [address] 8 am to 5 pm Monday-Friday * In the [facility]   The following sites will take your  insurance:  . [facility]  [phone] Get Driving Directions Find a Provider at this Location  [address] [place], [place] [address] . 10 am to 8 pm Monday-Friday . 12 pm to 8 pm Saturday-Sunday   . [facility]  [phone] Get Driving Directions Find a Provider at this Location  [address], [address] [place], [place] [address] . 8 am to 8 pm Monday-Friday . 9 am to 6 pm Saturday . 11 am to 6 pm Sunday   . [facility] [place]  [phone] Get Driving Directions  [address] [place], [place] [address] . 8 am to 8 pm Monday-Friday . 8 am to 4 pm Saturday-Sunday   Your e-visit answers were reviewed by a board certified advanced clinical practitioner to complete your personal care plan.  Thank you for using e-Visits.  

## 2018-03-26 NOTE — Progress Notes (Signed)
[name] [place], [place] [address] Phone: [phone] Subjective:     CC: Back pain follow-up  EYC:[id number]  Rachel [name] is a 52 y.o. female coming in with complaint of back pain. Been about 2 months. Feeling ok. Here for OMT. Patient has had cervicogenic headache and low back pain.  Has been responding very well to manipulation.  Has been 2 months since we have seen patient.  Had to cancel appointment secondary to family issues.  Also has been traveling recently.  Feels that that has been worsening overall.      Past Medical History:  Diagnosis Date  . ALLERGIC RHINITIS 10/04/2007  . Anemia 01/21/2011  . ASTHMA 08/02/2007   INHALERS ONLY IN [place]  . Blood transfusion without reported diagnosis    with first child   . COMMON MIGRAINE 05/15/2009  . DIABETES MELLITUS, TYPE II 08/02/2007  . GERD 08/02/2007  . HYPERLIPIDEMIA 08/02/2007  . INSOMNIA-SLEEP DISORDER-UNSPEC 01/04/2008  . INTERMITTENT VERTIGO 05/15/2009  . LIBIDO, DECREASED 01/09/2010   Past Surgical History:  Procedure Laterality Date  . CESAREAN SECTION     x 3  . CYST EXCISION     left knee  . PAROTIDECTOMY  10/21/2011   Procedure: PAROTIDECTOMY;  Surgeon: Melida Quitter, MD;  Location: [facility] OR;  Service: ENT;  Laterality: Left;   Social History   Socioeconomic History  . Marital status: Married    Spouse name: [name]  . Number of children: 3  . Years of education: Degree  . Highest education level: Not on file  Occupational History  . Occupation: Research scientist (life sciences): [employer]  . Occupation: Chartered certified accountant    Employer: [employer]  . Financial resource strain: Not on file  . Food insecurity:    Worry: Not on file    Inability: Not on file  . Transportation needs:    Medical: Not on file    Non-medical: Not on file  Tobacco Use  . Smoking status: Never Smoker  . Smokeless tobacco: Never Used  Substance and Sexual Activity  . Alcohol  use: No    Alcohol/week: 0.0 standard drinks  . Drug use: No  . Sexual activity: Yes  Lifestyle  . Physical activity:    Days per week: Not on file    Minutes per session: Not on file  . Stress: Not on file  Relationships  . Social connections:    Talks on phone: Not on file    Gets together: Not on file    Attends religious service: Not on file    Active member of club or organization: Not on file    Attends meetings of clubs or organizations: Not on file    Relationship status: Not on file  Other Topics Concern  . Not on file  Social History Narrative   She has 3 daughters.   Patient has a 4 year degree.    Patient working at [employer].    Patient is married to [place].   Allergies  Allergen Reactions  . Lipitor [Atorvastatin]     REACTION: myalgias  . Nitroglycerin    Family History  Problem Relation Age of Onset  . Hypertension Father   . Diabetes Father   . Asthma Father   . Cervical cancer Maternal Aunt   . Heart disease Maternal Aunt   . Anesthesia problems Neg Hx   . Colon cancer Neg Hx  Current Outpatient Medications (Endocrine & Metabolic):  .  insulin aspart (NOVOLOG FLEXPEN) 100 UNIT/ML FlexPen, Inject 6-10 Units into the skin 3 (three) times daily before meals. .  Insulin Glargine (LANTUS SOLOSTAR) 100 UNIT/ML Solostar Pen, INJECT 36 UNITS IN THE MORNING. .  insulin lispro (HUMALOG KWIKPEN) 100 UNIT/ML KiwkPen, Inject 0.15 mLs (15 Units total) into the skin 3 (three) times daily. Marland Kitchen  JANUVIA 100 MG tablet, TAKE 1 TABLET (100 MG TOTAL) BY MOUTH DAILY. .  metFORMIN (GLUCOPHAGE-XR) 500 MG 24 hr tablet, TAKE 2 TABLETS BY MOUTH 2 TIMES DAILY WITH A MEAL. .  predniSONE (STERAPRED UNI-PAK 21 TAB) 10 MG (21) TBPK tablet, As directed  Current Outpatient Medications (Cardiovascular):  .  atenolol (TENORMIN) 25 MG tablet, Take 1 tablet (25 mg total) by mouth daily. Marland Kitchen  atenolol (TENORMIN) 25 MG tablet, Take 1 tablet (25 mg total) by mouth daily. X 7 days, then  increase to 2 tabs ('50mg'$ ) daily .  rosuvastatin (CRESTOR) 40 MG tablet, Take 1 tablet (40 mg total) by mouth daily.   Current Outpatient Medications (Analgesics):  .  aspirin 81 MG EC tablet, Take 81 mg by mouth daily.   .  butalbital-acetaminophen-caffeine (FIORICET, ESGIC) 50-325-40 MG tablet, TAKE 1 TABLET BY MOUTH EVERY 6 HOURS AS NEEDED FOR HEADACHE (MAX OF 1 OR 2 TABLETS PER DAY) .  Erenumab-aooe (AIMOVIG) 70 MG/ML SOAJ, Inject 70 mg into the skin every 30 (thirty) days. Eduard Roux (AIMOVIG) 70 MG/ML SOAJ, Inject 70 mg into the skin every 30 (thirty) days. .  flurbiprofen (ANSAID) 100 MG tablet, Take 1 tablet (100 mg total) by mouth every 8 (eight) hours as needed. Take no more than '300mg'$  in 24 hours .  meloxicam (MOBIC) 15 MG tablet, TAKE 1 TABLET (15 MG TOTAL) BY MOUTH DAILY. .  naproxen (NAPROSYN) 500 MG tablet, TAKE 1 TABLET BY MOUTH 2 TIMES DAILY WITH A MEAL .  traMADol (ULTRAM) 50 MG tablet, TAKE 1 TABLET BY MOUTH EVERY 8 HOURS AS NEEDED .  SUMAtriptan Succinate (ZEMBRACE SYMTOUCH) 3 MG/0.5ML SOAJ, Inject 1 Device into the skin once. May repeat dose once after 1 hour if headache persists or returns (do not exceed two injections in 24 hours)   Current Outpatient Medications (Other):  Marland Kitchen  ACCU-CHEK FASTCLIX LANCETS MISC, USE TO CHECK BLOOD SUGARS TWICE TIMES A DAY .  ACCU-CHEK GUIDE test strip, USE TO CHECK BLOOD SUGARS TWICE A DAY DX E11.9 .  Blood Glucose Monitoring Suppl (ACCU-CHEK GUIDE) w/Device KIT, 1 Device by Does not apply route 2 (two) times daily. Use to check blood sugars twice a day Dx. E11.9 .  DENTA 5000 PLUS 1.1 % CREA dental cream,  .  esomeprazole (NEXIUM) 40 MG capsule, TAKE 1 CAPSULE BY MOUTH 2 TIMES DAILY BEFORE A MEAL. Marland Kitchen  gabapentin (NEURONTIN) 300 MG capsule, Take 1 capsule (300 mg total) by mouth 2 (two) times daily. .  Insulin Pen Needle (UNIFINE PENTIPS) 32G X 4 MM MISC, USE 4x a day .  methocarbamol (ROBAXIN) 500 MG tablet, Take 1 tablet (500 mg  total) by mouth 2 (two) times daily as needed for muscle spasms. .  OnabotulinumtoxinA (BOTOX IJ), Inject 1 each as directed every 3 (three) months. .  ondansetron (ZOFRAN) 4 MG tablet, TAKE 1 TABLET BY MOUTH EVERY 8 HOURS AS NEEDED FOR NAUSEA OR VOMITING.    Past medical history, social, surgical and family history all reviewed in electronic medical record.  No pertanent information unless stated regarding to the chief  complaint.   Review of Systems:  No headache, visual changes, nausea, vomiting, diarrhea, constipation, dizziness, abdominal pain, skin rash, fevers, chills, night sweats, weight loss, swollen lymph nodes, body aches, joint swelling, muscle aches, chest pain, shortness of breath, mood changes.   Objective  Blood pressure 90/64, pulse 91, height 5' (1.524 m), weight 149 lb (67.6 kg), SpO2 98 %.    General: No apparent distress alert and oriented x3 mood and affect normal, dressed appropriately.  HEENT: Pupils equal, extraocular movements intact  Respiratory: Patient's speak in full sentences and does not appear short of breath  Cardiovascular: No lower extremity edema, non tender, no erythema  Skin: Warm dry intact with no signs of infection or rash on extremities or on axial skeleton.  Abdomen: Soft nontender  Neuro: Cranial nerves II through XII are intact, neurovascularly intact in all extremities with 2+ DTRs and 2+ pulses.  Lymph: No lymphadenopathy of posterior or anterior cervical chain or axillae bilaterally.  Gait normal with good balance and coordination.  MSK:  Non tender with full range of motion and good stability and symmetric strength and tone of shoulders, elbows, wrist, hip, knee and ankles bilaterally.  Neck: Inspection loss of lordosis. No palpable stepoffs. Negative Spurling's maneuver. Mild limited range of motion lacking last 5 to 10 degrees of rotation and sidebending bilaterally. Grip strength and sensation normal in bilateral hands Strength good  C4 to T1 distribution No sensory change to C4 to T1 Negative Hoffman sign bilaterally Reflexes normal Tightness of the trapezius bilaterally  Low back exam has tightness with Corky Sox test but negative straight leg test.  Mild pain over the sacroiliac joint bilaterally.  Near full range of motion no noted.  Neurovascularly intact distally with 5 out of 5 strength  Osteopathic findings C3 flexed rotated and side bent right C4 flexed rotated and side bent left T3 extended rotated and side bent right inhaled third rib T7 extended rotated and side bent left L4 flexed rotated and side bent left  Sacrum forward flexion      Impression and Recommendations:     This case required medical decision making of moderate complexity. The above documentation has been reviewed and is accurate and complete Lyndal Pulley, DO       Note: This dictation was prepared with Dragon dictation along with smaller phrase technology. Any transcriptional errors that result from this process are unintentional.

## 2018-03-27 ENCOUNTER — Ambulatory Visit (INDEPENDENT_AMBULATORY_CARE_PROVIDER_SITE_OTHER): Payer: 59 | Admitting: Family Medicine

## 2018-03-27 ENCOUNTER — Encounter: Payer: Self-pay | Admitting: Family Medicine

## 2018-03-27 VITALS — BP 90/64 | HR 91 | Ht 60.0 in | Wt 149.0 lb

## 2018-03-27 DIAGNOSIS — M999 Biomechanical lesion, unspecified: Secondary | ICD-10-CM

## 2018-03-27 DIAGNOSIS — M9908 Segmental and somatic dysfunction of rib cage: Secondary | ICD-10-CM

## 2018-03-27 DIAGNOSIS — M9902 Segmental and somatic dysfunction of thoracic region: Secondary | ICD-10-CM | POA: Diagnosis not present

## 2018-03-27 DIAGNOSIS — M9900 Segmental and somatic dysfunction of head region: Secondary | ICD-10-CM | POA: Diagnosis not present

## 2018-03-27 DIAGNOSIS — R51 Headache: Secondary | ICD-10-CM

## 2018-03-27 DIAGNOSIS — M9904 Segmental and somatic dysfunction of sacral region: Secondary | ICD-10-CM

## 2018-03-27 DIAGNOSIS — G4486 Cervicogenic headache: Secondary | ICD-10-CM

## 2018-03-27 DIAGNOSIS — M9903 Segmental and somatic dysfunction of lumbar region: Secondary | ICD-10-CM

## 2018-03-27 NOTE — Assessment & Plan Note (Signed)
Cervicogenic headaches.  Worsening recently.  Has been 2 months since was seen patient.  Increasing stress recently.  Responded well to manipulation.  Discussed icing regimen and home exercise.  Discussed which activities of doing which was to avoid.  Follow-up again in 4 weeks

## 2018-03-27 NOTE — Patient Instructions (Signed)
Good to see you  Sorry you were hurting so much  Ice is your friend Stay active See me again in 4ish weeks

## 2018-03-27 NOTE — Assessment & Plan Note (Signed)
Decision today to treat with OMT was based on Physical Exam  After verbal consent patient was treated with HVLA, ME, FPR techniques in cervical, thoracic, rib, lumbar and sacral areas  Patient tolerated the procedure well with improvement in symptoms  Patient given exercises, stretches and lifestyle modifications  See medications in patient instructions if given  Patient will follow up in 4 weeks 

## 2018-03-28 ENCOUNTER — Ambulatory Visit: Payer: 59 | Admitting: Family Medicine

## 2018-03-30 ENCOUNTER — Ambulatory Visit: Payer: Self-pay | Admitting: Family Medicine

## 2018-03-30 ENCOUNTER — Encounter: Payer: Self-pay | Admitting: Family Medicine

## 2018-03-30 VITALS — BP 110/80 | HR 104 | Temp 99.1°F | Wt 147.0 lb

## 2018-03-30 DIAGNOSIS — R059 Cough, unspecified: Secondary | ICD-10-CM

## 2018-03-30 DIAGNOSIS — R05 Cough: Secondary | ICD-10-CM

## 2018-03-30 DIAGNOSIS — R519 Headache, unspecified: Secondary | ICD-10-CM

## 2018-03-30 DIAGNOSIS — R6889 Other general symptoms and signs: Secondary | ICD-10-CM

## 2018-03-30 DIAGNOSIS — R51 Headache: Secondary | ICD-10-CM

## 2018-03-30 LAB — POCT INFLUENZA A/B
Influenza A, POC: NEGATIVE
Influenza B, POC: NEGATIVE

## 2018-03-30 MED ORDER — BENZONATATE 200 MG PO CAPS
200.0000 mg | ORAL_CAPSULE | Freq: Three times a day (TID) | ORAL | 0 refills | Status: DC | PRN
Start: 2018-03-30 — End: 2019-07-02

## 2018-03-30 NOTE — Patient Instructions (Signed)
Viral Illness, Adult Viruses are tiny germs that can get into a person's body and cause illness. There are many different types of viruses, and they cause many types of illness. Viral illnesses can range from mild to severe. They can affect various parts of the body. Common illnesses that are caused by a virus include colds and the flu.   What are the causes? Many types of viruses can cause illness. Viruses invade cells in your body, multiply, and cause the infected cells to malfunction or die. When the cell dies, it releases more of the virus. When this happens, you develop symptoms of the illness, and the virus continues to spread to other cells. If the virus takes over the function of the cell, it can cause the cell to divide and grow out of control, as is the case when a virus causes cancer. Different viruses get into the body in different ways. You can get a virus by:  Swallowing food or water that is contaminated with the virus.  Breathing in droplets that have been coughed or sneezed into the air by an infected person.  Touching a surface that has been contaminated with the virus and then touching your eyes, nose, or mouth.  Being bitten by an insect or animal that carries the virus.  Having sexual contact with a person who is infected with the virus.  Being exposed to blood or fluids that contain the virus, either through an open cut or during a transfusion.  If a virus enters your body, your body's defense system (immune system) will try to fight the virus. You may be at higher risk for a viral illness if your immune system is weak. What are the signs or symptoms? Symptoms vary depending on the type of virus and the location of the cells that it invades. Common symptoms of the main types of viral illnesses include: Cold and flu viruses  Fever.  Headache.  Sore throat.  Muscle aches.  Nasal congestion.  Cough. Digestive system (gastrointestinal)  viruses  Fever.  Abdominal pain.  Nausea.  Diarrhea. Liver viruses (hepatitis)  Loss of appetite.  Tiredness.  Yellowing of the skin (jaundice). Brain and spinal cord viruses  Fever.  Headache.  Stiff neck.  Nausea and vomiting.  Confusion or sleepiness. Skin viruses  Warts.  Itching.  Rash. Sexually transmitted viruses  Discharge.  Swelling.  Redness.  Rash. How is this treated? Viruses can be difficult to treat because they live within cells. Antibiotic medicines do not treat viruses because these drugs do not get inside cells. Treatment for a viral illness may include:  Resting and drinking plenty of fluids.  Medicines to relieve symptoms. These can include over-the-counter medicine for pain and fever, medicines for cough or congestion, and medicines to relieve diarrhea.  Antiviral medicines. These drugs are available only for certain types of viruses. They may help reduce flu symptoms if taken early. There are also many antiviral medicines for hepatitis and HIV/AIDS.  Some viral illnesses can be prevented with vaccinations. A common example is the flu shot. Follow these instructions at home: Medicines   Take over-the-counter and prescription medicines only as told by your health care provider.  If you were prescribed an antiviral medicine, take it as told by your health care provider. Do not stop taking the medicine even if you start to feel better.  Be aware of when antibiotics are needed and when they are not needed. Antibiotics do not treat viruses. If your health care provider  thinks that you may have a bacterial infection as well as a viral infection, you may get an antibiotic. ? Do not ask for an antibiotic prescription if you have been diagnosed with a viral illness. That will not make your illness go away faster. ? Frequently taking antibiotics when they are not needed can lead to antibiotic resistance. When this develops, the medicine no  longer works against the bacteria that it normally fights. General instructions  Drink enough fluids to keep your urine clear or pale yellow.  Rest as much as possible.  Return to your normal activities as told by your health care provider. Ask your health care provider what activities are safe for you.  Keep all follow-up visits as told by your health care provider. This is important. How is this prevented? Take these actions to reduce your risk of viral infection:  Eat a healthy diet and get enough rest.  Wash your hands often with soap and water. This is especially important when you are in public places. If soap and water are not available, use hand sanitizer.  Avoid close contact with friends and family who have a viral illness.  If you travel to areas where viral gastrointestinal infection is common, avoid drinking water or eating raw food.  Keep your immunizations up to date. Get a flu shot every year as told by your health care provider.  Do not share toothbrushes, nail clippers, razors, or needles with other people.  Always practice safe sex.  Contact a health care provider if:  You have symptoms of a viral illness that do not go away.  Your symptoms come back after going away.  Your symptoms get worse. Get help right away if:  You have trouble breathing.  You have a severe headache or a stiff neck.  You have severe vomiting or abdominal pain. This information is not intended to replace advice given to you by your health care provider. Make sure you discuss any questions you have with your health care provider. Document Released: 10/10/2015 Document Revised: 11/12/2015 Document Reviewed: 10/10/2015 Elsevier Interactive Patient Education  [employer].

## 2018-03-30 NOTE — Progress Notes (Signed)
Rachel [name] is a 52 y.o. female who presents today with concerns of flu like symptoms. She reports that she has been exposed to flu at her place of employment. She reports that she has a headache as well and chronic migraines. She reports that she has done her usual care that her symptoms have not improved.  Review of Systems  Constitutional: Positive for chills, fever and malaise/fatigue.  HENT: Negative for congestion, ear discharge, ear pain, sinus pain and sore throat.   Eyes: Negative.   Respiratory: Negative for cough, sputum production and shortness of breath.   Cardiovascular: Negative.  Negative for chest pain.  Gastrointestinal: Negative for abdominal pain, diarrhea, nausea and vomiting.  Genitourinary: Negative for dysuria, frequency, hematuria and urgency.  Musculoskeletal: Negative for myalgias.  Skin: Negative.   Neurological: Positive for headaches.  Endo/Heme/Allergies: Negative.   Psychiatric/Behavioral: Negative.     O: Vitals:   03/30/18 0841  BP: 110/80  Pulse: (!) 104  Temp: 99.1 F (37.3 C)  SpO2: 98%     Physical Exam  Constitutional: She is oriented to person, place, and time. Vital signs are normal. She appears well-developed and well-nourished. She is active.  Non-toxic appearance. She does not have a sickly appearance. She appears ill. She appears distressed.  HENT:  Head: Normocephalic.  Right Ear: Hearing, tympanic membrane, external ear and ear canal normal.  Left Ear: Hearing, tympanic membrane, external ear and ear canal normal.  Nose: Rhinorrhea present. Right sinus exhibits no maxillary sinus tenderness and no frontal sinus tenderness. Left sinus exhibits no maxillary sinus tenderness and no frontal sinus tenderness.  Mouth/Throat: Uvula is midline and oropharynx is clear and moist.  Neck: Normal range of motion. Neck supple.  Cardiovascular: Normal rate, regular rhythm, normal heart sounds and normal pulses.  Pulmonary/Chest: Effort normal and  breath sounds normal.  Abdominal: Soft. Bowel sounds are normal.  Musculoskeletal: Normal range of motion.  Lymphadenopathy:       Head (right side): No submental and no submandibular adenopathy present.       Head (left side): No submental and no submandibular adenopathy present.    She has no cervical adenopathy.  Neurological: She is alert and oriented to person, place, and time.  Skin: She is diaphoretic.  Psychiatric: She has a normal mood and affect.  Vitals reviewed.  A: 1. Flu-like symptoms   2. Cough   3. Nonintractable headache, unspecified chronicity pattern, unspecified headache type    P: Discussed exam findings, diagnosis etiology and medication use and indications reviewed with patient. Follow- Up and discharge instructions provided. No emergent/urgent issues found on exam.  Patient verbalized understanding of information provided and agrees with plan of care (POC), all questions answered.  Suspect vial illness- work note provided x 48 hours and symptomatic support for cough provided. Advised to f/u if condition worsens or does not improve.  1. Flu-like symptoms - POCT Influenza A/B Results for orders placed or performed in visit on 03/30/18 (from the past 24 hour(s))  POCT Influenza A/B     Status: Normal   Collection Time: 03/30/18  9:09 AM  Result Value Ref Range   Influenza A, POC Negative Negative   Influenza B, POC Negative Negative     2. Cough - benzonatate (TESSALON) 200 MG capsule; Take 1 capsule (200 mg total) by mouth 3 (three) times daily as needed (with full glass of water).  3. Nonintractable headache, unspecified chronicity pattern, unspecified headache type Tylenol/Motrin and speak to PCP about migraine treatment  plan.

## 2018-04-01 ENCOUNTER — Ambulatory Visit (HOSPITAL_COMMUNITY)
Admission: EM | Admit: 2018-04-01 | Discharge: 2018-04-01 | Disposition: A | Discharge status: Home / Self Care | Payer: 59 | Attending: Internal Medicine | Admitting: Internal Medicine

## 2018-04-01 ENCOUNTER — Encounter (HOSPITAL_COMMUNITY): Payer: Self-pay

## 2018-04-01 DIAGNOSIS — Z7982 Long term (current) use of aspirin: Secondary | ICD-10-CM | POA: Insufficient documentation

## 2018-04-01 DIAGNOSIS — E785 Hyperlipidemia, unspecified: Secondary | ICD-10-CM | POA: Diagnosis not present

## 2018-04-01 DIAGNOSIS — R05 Cough: Secondary | ICD-10-CM | POA: Diagnosis present

## 2018-04-01 DIAGNOSIS — E119 Type 2 diabetes mellitus without complications: Secondary | ICD-10-CM | POA: Insufficient documentation

## 2018-04-01 DIAGNOSIS — J069 Acute upper respiratory infection, unspecified: Secondary | ICD-10-CM

## 2018-04-01 DIAGNOSIS — K219 Gastro-esophageal reflux disease without esophagitis: Secondary | ICD-10-CM | POA: Diagnosis not present

## 2018-04-01 DIAGNOSIS — G4700 Insomnia, unspecified: Secondary | ICD-10-CM | POA: Diagnosis not present

## 2018-04-01 DIAGNOSIS — I10 Essential (primary) hypertension: Secondary | ICD-10-CM | POA: Diagnosis not present

## 2018-04-01 DIAGNOSIS — Z79899 Other long term (current) drug therapy: Secondary | ICD-10-CM | POA: Diagnosis not present

## 2018-04-01 DIAGNOSIS — F329 Major depressive disorder, single episode, unspecified: Secondary | ICD-10-CM | POA: Insufficient documentation

## 2018-04-01 DIAGNOSIS — B9789 Other viral agents as the cause of diseases classified elsewhere: Secondary | ICD-10-CM

## 2018-04-01 DIAGNOSIS — Z794 Long term (current) use of insulin: Secondary | ICD-10-CM | POA: Insufficient documentation

## 2018-04-01 LAB — POCT RAPID STREP A: Streptococcus, Group A Screen (Direct): NEGATIVE

## 2018-04-01 MED ORDER — PSEUDOEPH-BROMPHEN-DM 30-2-10 MG/5ML PO SYRP: 5.0000 mL | ORAL_SOLUTION | Freq: Four times a day (QID) | ORAL | 0 refills | Status: DC | PRN

## 2018-04-01 MED ORDER — AMOXICILLIN-POT CLAVULANATE 875-125 MG PO TABS: 1.0000 | ORAL_TABLET | Freq: Two times a day (BID) | ORAL | 0 refills | Status: AC

## 2018-04-01 MED ORDER — CETIRIZINE HCL 10 MG PO CAPS: 10.0000 mg | ORAL_CAPSULE | Freq: Every day | ORAL | 0 refills | Status: DC

## 2018-04-01 MED ORDER — FLUTICASONE PROPIONATE 50 MCG/ACT NA SUSP: 1.0000 | Freq: Every day | NASAL | 0 refills | Status: DC

## 2018-04-01 NOTE — ED Triage Notes (Signed)
Pt states she has a cough , fever, and headache x 4 days.

## 2018-04-01 NOTE — Discharge Instructions (Signed)
For your congestion please begin taking daily Zyrtec/cetirizine, please also use Flonase nasal spray 1 to 2 sprays in each nostril daily For cough as an alternative you may use the cough syrup provided her over-the-counter Robitussin or Delsym Ibuprofen and Tylenol for fevers and headache You may fill prescription for Augmentin in 3 to 4 days if treatment with above is not improving  Please follow-up if developing persistent fevers, difficulty breathing, worsening chest discomfort, worsening symptoms

## 2018-04-02 NOTE — ED Provider Notes (Signed)
[facility]    CSN: [id number] Arrival date & time: 04/01/18  1003     History   Chief Complaint Chief Complaint  Patient presents with  . Cough    HPI Chania Kochanski is a 52 y.o. female DM type II, hypertension, hyperlipidemia today for evaluation of URI symptoms.  Patient states that she has had a cough, subjective fevers as well as headache/sinus pressure.  She has had decreased appetite and pain into her jaws/teeth.  She states that her fever has been up to 101.2, and had chills.  She has had some chest discomfort and congestion.  She has been using Tylenol at 4 hours.  Mucinex.  She is also try Tessalon for cough without relief.  She was tested for flu at another urgent care a few days ago and was negative.  Denies nausea or vomiting.  Her main complaint is the sinus pressure/headache with congestion and cough.  Symptoms for 5 to 6 days.  HPI  Past Medical History:  Diagnosis Date  . ALLERGIC RHINITIS 10/04/2007  . Anemia 01/21/2011  . ASTHMA 08/02/2007   INHALERS ONLY IN [place]  . Blood transfusion without reported diagnosis    with first child   . COMMON MIGRAINE 05/15/2009  . DIABETES MELLITUS, TYPE II 08/02/2007  . GERD 08/02/2007  . HYPERLIPIDEMIA 08/02/2007  . INSOMNIA-SLEEP DISORDER-UNSPEC 01/04/2008  . INTERMITTENT VERTIGO 05/15/2009  . LIBIDO, DECREASED 01/09/2010    Patient Active Problem List   Diagnosis Date Noted  . Nonallopathic lesion of rib cage 12/21/2017  . Orthostasis 05/11/2017  . Cervicogenic headache 04/27/2017  . Nonallopathic lesion of cervical region 04/27/2017  . Rheumatoid factor positive 02/10/2016  . Myalgia and myositis 01/08/2016  . Low back pain 12/23/2015  . Left shoulder pain 12/23/2015  . Nonallopathic lesion of lumbosacral region 07/29/2015  . Nonallopathic lesion of sacral region 07/29/2015  . Nonallopathic lesion of thoracic region 07/29/2015  . Loss of weight 06/12/2014  . Dysphagia, pharyngoesophageal phase 06/12/2014  .  Headache(784.0) 12/18/2012  . Recurrent falls 06/16/2012  . Chest pain 06/16/2012  . Back pain 03/06/2012  . Syncope and collapse 06/18/2011  . Fatigue 01/21/2011  . Microcytic anemia 01/21/2011  . Cervical radiculopathy 01/21/2011  . Preventative health care 10/11/2010  . Depression 01/09/2010  . LIBIDO, DECREASED 01/09/2010  . Migraine without aura 05/15/2009  . INTERMITTENT VERTIGO 05/15/2009  . INSOMNIA-SLEEP DISORDER-UNSPEC 01/04/2008  . Allergic rhinitis 10/04/2007  . Type 2 diabetes mellitus with hyperglycemia, with long-term current use of insulin ([place]) 08/02/2007  . Hyperlipidemia 08/02/2007  . Asthma 08/02/2007  . GERD 08/02/2007    Past Surgical History:  Procedure Laterality Date  . CESAREAN SECTION     x 3  . CYST EXCISION     left knee  . PAROTIDECTOMY  10/21/2011   Procedure: PAROTIDECTOMY;  Surgeon: Melida Quitter, MD;  Location: [facility];  Service: ENT;  Laterality: Left;    OB History   None      Home Medications    Prior to Admission medications   Medication Sig Start Date End Date Taking? Authorizing Provider  ACCU-CHEK FASTCLIX LANCETS MISC USE TO CHECK BLOOD SUGARS TWICE TIMES A DAY 06/28/17   Biagio Borg, MD  ACCU-CHEK GUIDE test strip USE TO CHECK BLOOD SUGARS TWICE A DAY DX E11.9 06/28/17   Biagio Borg, MD  amoxicillin-clavulanate (AUGMENTIN) 875-125 MG tablet Take 1 tablet by mouth every 12 (twelve) hours for 10 days. 04/01/18 04/11/18  Kenni Newton, Elesa Hacker, PA-C  aspirin 81 MG EC tablet Take 81 mg by mouth daily.      [provider]  atenolol (TENORMIN) 25 MG tablet Take 1 tablet (25 mg total) by mouth daily. 06/28/17   Tomi Likens, Adam R, DO  atenolol (TENORMIN) 25 MG tablet Take 1 tablet (25 mg total) by mouth daily. X 7 days, then increase to 2 tabs ('50mg'$ ) daily 12/29/17   Pieter Partridge, DO  benzonatate (TESSALON) 200 MG capsule Take 1 capsule (200 mg total) by mouth 3 (three) times daily as needed (with full glass of water). 03/30/18   Shella Maxim, NP  Blood Glucose Monitoring Suppl (ACCU-CHEK GUIDE) w/Device KIT 1 Device by Does not apply route 2 (two) times daily. Use to check blood sugars twice a day Dx. E11.9 08/11/16   Biagio Borg, MD  brompheniramine-pseudoephedrine-DM 30-2-10 MG/5ML syrup Take 5 mLs by mouth 4 (four) times daily as needed. 04/01/18   Tea Collums C, PA-C  butalbital-acetaminophen-caffeine (FIORICET, ESGIC) 50-325-40 MG tablet TAKE 1 TABLET BY MOUTH EVERY 6 HOURS AS NEEDED FOR HEADACHE (MAX OF 1 OR 2 TABLETS PER DAY) 03/09/18   Biagio Borg, MD  Cetirizine HCl 10 MG CAPS Take 1 capsule (10 mg total) by mouth daily for 15 days. 04/01/18 04/16/18  Rabon Scholle C, PA-C  DENTA 5000 PLUS 1.1 % CREA dental cream  08/05/16   [provider]  Erenumab-aooe (AIMOVIG) 70 MG/ML SOAJ Inject 70 mg into the skin every 30 (thirty) days. 01/18/18   Tomi Likens, Adam R, DO  Erenumab-aooe (AIMOVIG) 70 MG/ML SOAJ Inject 70 mg into the skin every 30 (thirty) days. 01/18/18   Tomi Likens, Adam R, DO  esomeprazole (NEXIUM) 40 MG capsule TAKE 1 CAPSULE BY MOUTH 2 TIMES DAILY BEFORE A MEAL. 01/30/18   Biagio Borg, MD  flurbiprofen (ANSAID) 100 MG tablet Take 1 tablet (100 mg total) by mouth every 8 (eight) hours as needed. Take no more than '300mg'$  in 24 hours 01/18/18   Tomi Likens, Adam R, DO  fluticasone (FLONASE) 50 MCG/ACT nasal spray Place 1-2 sprays into both nostrils daily for 7 days. 04/01/18 04/08/18  Shelsea Hangartner C, PA-C  gabapentin (NEURONTIN) 300 MG capsule Take 1 capsule (300 mg total) by mouth 2 (two) times daily. 01/18/18   Tomi Likens, Adam R, DO  insulin aspart (NOVOLOG FLEXPEN) 100 UNIT/ML FlexPen Inject 6-10 Units into the skin 3 (three) times daily before meals. 12/20/17   Philemon Kingdom, MD  Insulin Glargine (LANTUS SOLOSTAR) 100 UNIT/ML Solostar Pen INJECT 36 UNITS IN THE MORNING. 01/13/18   Philemon Kingdom, MD  insulin lispro (HUMALOG KWIKPEN) 100 UNIT/ML KiwkPen Inject 0.15 mLs (15 Units total) into the skin 3 (three) times daily.  12/20/17   Philemon Kingdom, MD  Insulin Pen Needle (UNIFINE PENTIPS) 32G X 4 MM MISC USE 4x a day 12/20/17   Philemon Kingdom, MD  JANUVIA 100 MG tablet TAKE 1 TABLET (100 MG TOTAL) BY MOUTH DAILY. 06/28/17   Biagio Borg, MD  meloxicam (MOBIC) 15 MG tablet TAKE 1 TABLET (15 MG TOTAL) BY MOUTH DAILY. 12/27/17   Lyndal Pulley, DO  metFORMIN (GLUCOPHAGE-XR) 500 MG 24 hr tablet TAKE 2 TABLETS BY MOUTH 2 TIMES DAILY WITH A MEAL. 10/04/17   Biagio Borg, MD  methocarbamol (ROBAXIN) 500 MG tablet Take 1 tablet (500 mg total) by mouth 2 (two) times daily as needed for muscle spasms. 12/03/15   Biagio Borg, MD  naproxen (NAPROSYN) 500 MG tablet TAKE 1 TABLET BY MOUTH  2 TIMES DAILY WITH A MEAL 12/13/17   Lyndal Pulley, DO  OnabotulinumtoxinA (BOTOX IJ) Inject 1 each as directed every 3 (three) months.    [provider]  ondansetron (ZOFRAN) 4 MG tablet TAKE 1 TABLET BY MOUTH EVERY 8 HOURS AS NEEDED FOR NAUSEA OR VOMITING. 03/13/18   Biagio Borg, MD  rosuvastatin (CRESTOR) 40 MG tablet Take 1 tablet (40 mg total) by mouth daily. 06/25/16   Biagio Borg, MD  SUMAtriptan Succinate [facility]) 3 MG/0.5ML SOAJ Inject 1 Device into the skin once. May repeat dose once after 1 hour if headache persists or returns (do not exceed two injections in 24 hours) 05/12/16 05/12/16  Pieter Partridge, DO  traMADol (ULTRAM) 50 MG tablet TAKE 1 TABLET BY MOUTH EVERY 8 HOURS AS NEEDED 01/13/18   Biagio Borg, MD    Family History Family History  Problem Relation Age of Onset  . Hypertension Father   . Diabetes Father   . Asthma Father   . Cervical cancer Maternal Aunt   . Heart disease Maternal Aunt   . Anesthesia problems Neg Hx   . Colon cancer Neg Hx     Social History Social History   Tobacco Use  . Smoking status: Never Smoker  . Smokeless tobacco: Never Used  Substance Use Topics  . Alcohol use: No    Alcohol/week: 0.0 standard drinks  . Drug use: No     Allergies   Lipitor  [atorvastatin] and Nitroglycerin   Review of Systems Review of Systems  Constitutional: Positive for appetite change and fever. Negative for activity change, chills and fatigue.  HENT: Positive for congestion, rhinorrhea, sinus pressure and sore throat. Negative for ear pain and trouble swallowing.   Eyes: Negative for discharge and redness.  Respiratory: Positive for cough. Negative for chest tightness and shortness of breath.   Cardiovascular: Negative for chest pain.  Gastrointestinal: Negative for abdominal pain, diarrhea, nausea and vomiting.  Musculoskeletal: Negative for myalgias.  Skin: Negative for rash.  Neurological: Positive for headaches. Negative for dizziness and light-headedness.     Physical Exam Triage Vital Signs ED Triage Vitals  Enc Vitals Group     BP 04/01/18 1018 125/88     Pulse Rate 04/01/18 1018 (!) 108     Resp 04/01/18 1018 15     Temp 04/01/18 1018 98.5 F (36.9 C)     Temp Source 04/01/18 1018 Oral     SpO2 04/01/18 1018 100 %     Weight 04/01/18 1019 143 lb (64.9 kg)     Height --      Head Circumference --      Peak Flow --      Pain Score 04/01/18 1019 7     Pain Loc --      Pain Edu? --      Excl. in [place]? --    No data found.  Updated Vital Signs BP 125/88 (BP Location: Left Arm)   Pulse (!) 101   Temp 98.5 F (36.9 C) (Oral)   Resp 15   Wt 143 lb (64.9 kg)   SpO2 100%   BMI 27.93 kg/m   Visual Acuity Right Eye Distance:   Left Eye Distance:   Bilateral Distance:    Right Eye Near:   Left Eye Near:    Bilateral Near:     Physical Exam  Constitutional: She appears well-developed and well-nourished. No distress.  HENT:  Head: Normocephalic and atraumatic.  Bilateral  ears without tenderness to palpation of external auricle, tragus and mastoid, EAC's without erythema or swelling, TM's with good bony landmarks and cone of light. Non erythematous.  Oral mucosa pink and moist, no tonsillar enlargement or exudate. Posterior  pharynx patent and erythematous, no uvula deviation or swelling. Normal phonation.  Eyes: Conjunctivae are normal.  Neck: Neck supple.  Cardiovascular: Regular rhythm.  No murmur heard. Tachycardic  Pulmonary/Chest: Effort normal and breath sounds normal. No respiratory distress.  Breathing comfortably at rest, CTABL, no wheezing, rales or other adventitious sounds auscultated  Abdominal: Soft. There is no tenderness.  Musculoskeletal: She exhibits no edema.  Neurological: She is alert.  Skin: Skin is warm and dry.  Psychiatric: She has a normal mood and affect.  Nursing note and vitals reviewed.    UC Treatments / Results  Labs (all labs ordered are listed, but only abnormal results are displayed) Labs Reviewed  CULTURE, GROUP A STREP [facility])  POCT RAPID STREP A    EKG None  Radiology No results found.  Procedures Procedures (including critical care time)  Medications Ordered in UC Medications - No data to display  Initial Impression / Assessment and Plan / UC Course  I have reviewed the triage vital signs and the nursing notes.  Pertinent labs & imaging results that were available during my care of the patient were reviewed by me and considered in my medical decision making (see chart for details).    Patient mildly tachycardic, exam unremarkable, lungs clear.  Symptoms most likely viral, will recommend further symptomatic management of congestion with Zyrtec and Flonase.  Provided alternative cough syrup.  Recommend patient to continue this over the next 3 to 4 days.  If she still has no improvement in her symptoms we will treat for sinusitis with Augmentin and patient may fill this prescription.  Tylenol and ibuprofen for headache and fevers.  Continue to monitor symptoms and breathing,Discussed strict return precautions. Patient verbalized understanding and is agreeable with plan.   Final Clinical Impressions(s) / UC Diagnoses   Final diagnoses:  Viral URI with  cough     Discharge Instructions     For your congestion please begin taking daily Zyrtec/cetirizine, please also use Flonase nasal spray 1 to 2 sprays in each nostril daily For cough as an alternative you may use the cough syrup provided her over-the-counter Robitussin or Delsym Ibuprofen and Tylenol for fevers and headache You may fill prescription for Augmentin in 3 to 4 days if treatment with above is not improving  Please follow-up if developing persistent fevers, difficulty breathing, worsening chest discomfort, worsening symptoms   ED Prescriptions    Medication Sig Dispense Auth. Provider   amoxicillin-clavulanate (AUGMENTIN) 875-125 MG tablet Take 1 tablet by mouth every 12 (twelve) hours for 10 days. 20 tablet Dana Debo C, PA-C   Cetirizine HCl 10 MG CAPS Take 1 capsule (10 mg total) by mouth daily for 15 days. 15 capsule Lakima Dona C, PA-C   fluticasone (FLONASE) 50 MCG/ACT nasal spray Place 1-2 sprays into both nostrils daily for 7 days. 1 g Sofie Schendel C, PA-C   brompheniramine-pseudoephedrine-DM 30-2-10 MG/5ML syrup Take 5 mLs by mouth 4 (four) times daily as needed. 120 mL Brailyn Delman C, PA-C     Controlled Substance Prescriptions [place] Controlled Substance Registry consulted? Not Applicable   Janith Lima, [place] 04/02/18 1245

## 2018-04-03 ENCOUNTER — Telehealth: Payer: Self-pay | Admitting: Emergency Medicine

## 2018-04-03 LAB — CULTURE, GROUP A STREP (THRC)

## 2018-04-03 NOTE — Telephone Encounter (Signed)
Called patient, no answer, left generic message with our contact information to return our call.nmw

## 2018-04-03 NOTE — Telephone Encounter (Signed)
Patient returned the call and is aware of results.  Patient states she is taking the antibiotic prescribed.  Will follow-up either with this clinic or her PCP if she doesn't continue to improve.

## 2018-04-19 DIAGNOSIS — N952 Postmenopausal atrophic vaginitis: Secondary | ICD-10-CM | POA: Diagnosis not present

## 2018-04-19 DIAGNOSIS — N762 Acute vulvitis: Secondary | ICD-10-CM | POA: Diagnosis not present

## 2018-04-25 NOTE — Progress Notes (Signed)
[name] [address] [place], [place] [address] Phone: [phone] Subjective:   Rachel [name], am serving as a scribe for Dr. Hulan Saas.   CC: Neck and back pain  [id number]  Rachel [name] is a 52 y.o. female coming in with complaint of neck and low back pain. Had to use Tramdol for pain due to having to move heavy patient's. Her feet were going numb yesterday.  Patient states that with her new her general she is doing more lifting in the hospital.  Feels that this is exacerbated some of the underlying problems.  Some increasing in discomfort and pain.     Past Medical History:  Diagnosis Date  . ALLERGIC RHINITIS 10/04/2007  . Anemia 01/21/2011  . ASTHMA 08/02/2007   INHALERS ONLY IN [place]  . Blood transfusion without reported diagnosis    with first child   . COMMON MIGRAINE 05/15/2009  . DIABETES MELLITUS, TYPE II 08/02/2007  . GERD 08/02/2007  . HYPERLIPIDEMIA 08/02/2007  . INSOMNIA-SLEEP DISORDER-UNSPEC 01/04/2008  . INTERMITTENT VERTIGO 05/15/2009  . LIBIDO, DECREASED 01/09/2010   Past Surgical History:  Procedure Laterality Date  . CESAREAN SECTION     x 3  . CYST EXCISION     left knee  . PAROTIDECTOMY  10/21/2011   Procedure: PAROTIDECTOMY;  Surgeon: Melida Quitter, MD;  Location: [facility] OR;  Service: ENT;  Laterality: Left;   Social History   Socioeconomic History  . Marital status: Married    Spouse name: [name]  . Number of children: 3  . Years of education: Degree  . Highest education level: Not on file  Occupational History  . Occupation: Research scientist (life sciences): [employer]  . Occupation: Chartered certified accountant    Employer: [employer]  . Financial resource strain: Not on file  . Food insecurity:    Worry: Not on file    Inability: Not on file  . Transportation needs:    Medical: Not on file    Non-medical: Not on file  Tobacco Use  . Smoking status: Never Smoker  . Smokeless tobacco: Never Used    Substance and Sexual Activity  . Alcohol use: [name]    Alcohol/week: 0.0 standard drinks  . Drug use: [name]  . Sexual activity: Yes  Lifestyle  . Physical activity:    Days per week: Not on file    Minutes per session: Not on file  . Stress: Not on file  Relationships  . Social connections:    Talks on phone: Not on file    Gets together: Not on file    Attends religious service: Not on file    Active member of club or organization: Not on file    Attends meetings of clubs or organizations: Not on file    Relationship status: Not on file  Other Topics Concern  . Not on file  Social History Narrative   She has 3 daughters.   Patient has a 4 year degree.    Patient working at [employer].    Patient is married to [place].   Allergies  Allergen Reactions  . Lipitor [Atorvastatin]     REACTION: myalgias  . Nitroglycerin    Family History  Problem Relation Age of Onset  . Hypertension Father   . Diabetes Father   . Asthma Father   . Cervical cancer Maternal Aunt   . Heart disease Maternal Aunt   . Anesthesia problems  Neg Hx   . Colon cancer Neg Hx     Current Outpatient Medications (Endocrine & Metabolic):  .  insulin aspart (NOVOLOG FLEXPEN) 100 UNIT/ML FlexPen, Inject 6-10 Units into the skin 3 (three) times daily before meals. .  Insulin Glargine (LANTUS SOLOSTAR) 100 UNIT/ML Solostar Pen, INJECT 36 UNITS IN THE MORNING. .  insulin lispro (HUMALOG KWIKPEN) 100 UNIT/ML KiwkPen, Inject 0.15 mLs (15 Units total) into the skin 3 (three) times daily. Marland Kitchen  JANUVIA 100 MG tablet, TAKE 1 TABLET (100 MG TOTAL) BY MOUTH DAILY. .  metFORMIN (GLUCOPHAGE-XR) 500 MG 24 hr tablet, TAKE 2 TABLETS BY MOUTH 2 TIMES DAILY WITH A MEAL.  Current Outpatient Medications (Cardiovascular):  .  atenolol (TENORMIN) 25 MG tablet, Take 1 tablet (25 mg total) by mouth daily. Marland Kitchen  atenolol (TENORMIN) 25 MG tablet, Take 1 tablet (25 mg total) by mouth daily. X 7 days, then increase to 2 tabs ('50mg'$ ) daily .   rosuvastatin (CRESTOR) 40 MG tablet, Take 1 tablet (40 mg total) by mouth daily.  Current Outpatient Medications (Respiratory):  .  benzonatate (TESSALON) 200 MG capsule, Take 1 capsule (200 mg total) by mouth 3 (three) times daily as needed (with full glass of water). .  brompheniramine-pseudoephedrine-DM 30-2-10 MG/5ML syrup, Take 5 mLs by mouth 4 (four) times daily as needed. .  Cetirizine HCl 10 MG CAPS, Take 1 capsule (10 mg total) by mouth daily for 15 days. .  fluticasone (FLONASE) 50 MCG/ACT nasal spray, Place 1-2 sprays into both nostrils daily for 7 days.  Current Outpatient Medications (Analgesics):  .  aspirin 81 MG EC tablet, Take 81 mg by mouth daily.   .  butalbital-acetaminophen-caffeine (FIORICET, ESGIC) 50-325-40 MG tablet, TAKE 1 TABLET BY MOUTH EVERY 6 HOURS AS NEEDED FOR HEADACHE (MAX OF 1 OR 2 TABLETS PER DAY) .  Erenumab-aooe (AIMOVIG) 70 MG/ML SOAJ, Inject 70 mg into the skin every 30 (thirty) days. .  flurbiprofen (ANSAID) 100 MG tablet, Take 1 tablet (100 mg total) by mouth every 8 (eight) hours as needed. Take [name] more than '300mg'$  in 24 hours .  meloxicam (MOBIC) 15 MG tablet, TAKE 1 TABLET (15 MG TOTAL) BY MOUTH DAILY. .  naproxen (NAPROSYN) 500 MG tablet, TAKE 1 TABLET BY MOUTH 2 TIMES DAILY WITH A MEAL .  SUMAtriptan Succinate (ZEMBRACE SYMTOUCH) 3 MG/0.5ML SOAJ, Inject 1 Device into the skin once. May repeat dose once after 1 hour if headache persists or returns (do not exceed two injections in 24 hours) .  traMADol (ULTRAM) 50 MG tablet, TAKE 1 TABLET BY MOUTH EVERY 8 HOURS AS NEEDED   Current Outpatient Medications (Other):  Marland Kitchen  ACCU-CHEK FASTCLIX LANCETS MISC, USE TO CHECK BLOOD SUGARS TWICE TIMES A DAY .  ACCU-CHEK GUIDE test strip, USE TO CHECK BLOOD SUGARS TWICE A DAY DX E11.9 .  Blood Glucose Monitoring Suppl (ACCU-CHEK GUIDE) w/Device KIT, 1 Device by Does not apply route 2 (two) times daily. Use to check blood sugars twice a day Dx. E11.9 .  DENTA 5000 PLUS  1.1 % CREA dental cream,  .  esomeprazole (NEXIUM) 40 MG capsule, TAKE 1 CAPSULE BY MOUTH 2 TIMES DAILY BEFORE A MEAL. Marland Kitchen  gabapentin (NEURONTIN) 300 MG capsule, Take 1 capsule (300 mg total) by mouth 2 (two) times daily. .  Insulin Pen Needle (UNIFINE PENTIPS) 32G X 4 MM MISC, USE 4x a day .  methocarbamol (ROBAXIN) 500 MG tablet, Take 1 tablet (500 mg total) by mouth 2 (two) times daily  as needed for muscle spasms. .  OnabotulinumtoxinA (BOTOX IJ), Inject 1 each as directed every 3 (three) months. .  ondansetron (ZOFRAN) 4 MG tablet, TAKE 1 TABLET BY MOUTH EVERY 8 HOURS AS NEEDED FOR NAUSEA OR VOMITING.    Past medical history, social, surgical and family history all reviewed in electronic medical record.  [name] pertanent information unless stated regarding to the chief complaint.   Review of Systems:  [name] headache, visual changes, nausea, vomiting, diarrhea, constipation, dizziness, abdominal pain, skin rash, fevers, chills, night sweats, weight loss, swollen lymph nodes, body aches, joint swelling, muscle aches, chest pain, shortness of breath, mood changes.  Positive headaches, muscle aches  Objective  Blood pressure 120/90, pulse 90, height 5' (1.524 m), weight 143 lb (64.9 kg), SpO2 91 %.    General: [name] apparent distress alert and oriented x3 mood and affect normal, dressed appropriately.  HEENT: Pupils equal, extraocular movements intact  Respiratory: Patient's speak in full sentences and does not appear short of breath  Cardiovascular: [name] lower extremity edema, non tender, [name] erythema  Skin: Warm dry intact with [name] signs of infection or rash on extremities or on axial skeleton.  Abdomen: Soft nontender  Neuro: Cranial nerves II through XII are intact, neurovascularly intact in all extremities with 2+ DTRs and 2+ pulses.  Lymph: [name] lymphadenopathy of posterior or anterior cervical chain or axillae bilaterally.  Gait normal with good balance and coordination.  MSK:  Non tender with full  range of motion and good stability and symmetric strength and tone of shoulders, elbows, wrist, hip, knee and ankles bilaterally. Neck: Inspection loss of lordosis. [name] palpable stepoffs. Negative Spurling's maneuver. Tender to palpation.  Loss of range of motion of 5 to 10 degrees. Grip strength and sensation normal in bilateral hands Strength good C4 to T1 distribution [name] sensory change to C4 to T1 Negative Hoffman sign bilaterally Reflexes normal Tightness of the trapezius bilaterally  Osteopathic findings C2 flexed rotated and side bent right C4 flexed rotated and side bent left T3 extended rotated and side bent right inhaled third rib T6 extended rotated and side bent left L2 flexed rotated and side bent right Sacrum right on right     Impression and Recommendations:     This case required medical decision making of moderate complexity. The above documentation has been reviewed and is accurate and complete Lyndal Pulley, DO       Note: This dictation was prepared with Dragon dictation along with smaller phrase technology. Any transcriptional errors that result from this process are unintentional.

## 2018-04-26 ENCOUNTER — Ambulatory Visit (INDEPENDENT_AMBULATORY_CARE_PROVIDER_SITE_OTHER): Payer: 59 | Admitting: Internal Medicine

## 2018-04-26 ENCOUNTER — Encounter: Payer: Self-pay | Admitting: Family Medicine

## 2018-04-26 ENCOUNTER — Ambulatory Visit (INDEPENDENT_AMBULATORY_CARE_PROVIDER_SITE_OTHER): Payer: 59 | Admitting: Family Medicine

## 2018-04-26 ENCOUNTER — Encounter: Payer: Self-pay | Admitting: Internal Medicine

## 2018-04-26 VITALS — BP 120/90 | HR 90 | Ht 60.0 in | Wt 143.0 lb

## 2018-04-26 DIAGNOSIS — Z794 Long term (current) use of insulin: Secondary | ICD-10-CM | POA: Diagnosis not present

## 2018-04-26 DIAGNOSIS — M9908 Segmental and somatic dysfunction of rib cage: Secondary | ICD-10-CM | POA: Diagnosis not present

## 2018-04-26 DIAGNOSIS — G4486 Cervicogenic headache: Secondary | ICD-10-CM

## 2018-04-26 DIAGNOSIS — R51 Headache: Secondary | ICD-10-CM | POA: Diagnosis not present

## 2018-04-26 DIAGNOSIS — M999 Biomechanical lesion, unspecified: Secondary | ICD-10-CM

## 2018-04-26 DIAGNOSIS — M9902 Segmental and somatic dysfunction of thoracic region: Secondary | ICD-10-CM

## 2018-04-26 DIAGNOSIS — M9901 Segmental and somatic dysfunction of cervical region: Secondary | ICD-10-CM

## 2018-04-26 DIAGNOSIS — E1165 Type 2 diabetes mellitus with hyperglycemia: Secondary | ICD-10-CM

## 2018-04-26 DIAGNOSIS — M9903 Segmental and somatic dysfunction of lumbar region: Secondary | ICD-10-CM | POA: Diagnosis not present

## 2018-04-26 DIAGNOSIS — M9904 Segmental and somatic dysfunction of sacral region: Secondary | ICD-10-CM | POA: Diagnosis not present

## 2018-04-26 DIAGNOSIS — E785 Hyperlipidemia, unspecified: Secondary | ICD-10-CM | POA: Diagnosis not present

## 2018-04-26 DIAGNOSIS — J4520 Mild intermittent asthma, uncomplicated: Secondary | ICD-10-CM

## 2018-04-26 LAB — POCT GLYCOSYLATED HEMOGLOBIN (HGB A1C)
HbA1c POC (<> result, manual entry): 0 % (ref 4.0–5.6)
HbA1c, POC (controlled diabetic range): 0 % (ref 0.0–7.0)
HbA1c, POC (prediabetic range): 0 % — AB (ref 5.7–6.4)
Hemoglobin A1C: 8.6 % — AB (ref 4.0–5.6)

## 2018-04-26 MED ORDER — INSULIN GLARGINE 100 UNIT/ML SOLOSTAR PEN: PEN_INJECTOR | SUBCUTANEOUS | 1 refills | Status: DC

## 2018-04-26 NOTE — Patient Instructions (Signed)
Good to see you  Ice is your friend Did have to do a lot of work today  See me again in 4-6 weeks

## 2018-04-26 NOTE — Assessment & Plan Note (Signed)
stable overall by history and exam, recent data reviewed with pt, and pt to continue medical treatment as before,  to f/u any worsening symptoms or concerns  

## 2018-04-26 NOTE — Assessment & Plan Note (Signed)
Decision today to treat with OMT was based on Physical Exam  After verbal consent patient was treated with HVLA, ME, FPR techniques in cervical, thoracic, rib, lumbar and sacral areas  Patient tolerated the procedure well with improvement in symptoms  Patient given exercises, stretches and lifestyle modifications  See medications in patient instructions if given  Patient will follow up in 4-6 weeks 

## 2018-04-26 NOTE — Assessment & Plan Note (Signed)
Improved but still mild uncontrolled, o/w stable overall by history and exam, recent data reviewed with pt, and pt to increase the lantus to 40 units daily, cont to monitor sugars and diet,  to f/u any worsening symptoms or concerns

## 2018-04-26 NOTE — Patient Instructions (Addendum)
OK to increase the Lantus to 40 units per day  Please continue all other medications as before, and refills have been done if requested.  Please have the pharmacy call with any other refills you may need.  Please continue your efforts at being more active, low cholesterol diet, and weight control.  Please keep your appointments with your specialists as you may have planned  Please return in 6 months, or sooner if needed, with labs done 3-5 days ahead

## 2018-04-26 NOTE — Progress Notes (Signed)
Subjective:    Patient ID: Rachel [name], female    DOB: 1965-12-17, 52 y.o.   MRN: [id number]  HPI   Here to f/u as felt some difficulty in getting an appt soon with endo with whom she usually follows; has really been working in diet, activity and med compliance.  Peak wt has been 158, now better with diet and excersice.  CBg's at home improved to 200;s Wt Readings from Last 3 Encounters:  04/26/18 143 lb (64.9 kg)  04/26/18 143 lb (64.9 kg)  04/01/18 143 lb (64.9 kg)  Pt denies chest pain, increased sob or doe, wheezing, orthopnea, PND, increased LE swelling, palpitations, dizziness or syncope.  Pt denies new neurological symptoms such as new headache, or facial or extremity weakness or numbness   Pt denies polydipsia, polyuria Past Medical History:  Diagnosis Date  . ALLERGIC RHINITIS 10/04/2007  . Anemia 01/21/2011  . ASTHMA 08/02/2007   INHALERS ONLY IN [place]  . Blood transfusion without reported diagnosis    with first child   . COMMON MIGRAINE 05/15/2009  . DIABETES MELLITUS, TYPE II 08/02/2007  . GERD 08/02/2007  . HYPERLIPIDEMIA 08/02/2007  . INSOMNIA-SLEEP DISORDER-UNSPEC 01/04/2008  . INTERMITTENT VERTIGO 05/15/2009  . LIBIDO, DECREASED 01/09/2010   Past Surgical History:  Procedure Laterality Date  . CESAREAN SECTION     x 3  . CYST EXCISION     left knee  . PAROTIDECTOMY  10/21/2011   Procedure: PAROTIDECTOMY;  Surgeon: Melida Quitter, MD;  Location: [place];  Service: ENT;  Laterality: Left;    reports that she has never smoked. She has never used smokeless tobacco. She reports that she does not drink alcohol or use drugs. family history includes Asthma in her father; Cervical cancer in her maternal aunt; Diabetes in her father; Heart disease in her maternal aunt; Hypertension in her father. Allergies  Allergen Reactions  . Lipitor [Atorvastatin]     REACTION: myalgias  . Nitroglycerin    Current Outpatient Medications on File Prior to Visit  Medication Sig Dispense Refill    . ACCU-CHEK FASTCLIX LANCETS MISC USE TO CHECK BLOOD SUGARS TWICE TIMES A DAY 102 each 3  . ACCU-CHEK GUIDE test strip USE TO CHECK BLOOD SUGARS TWICE A DAY DX E11.9 100 each 3  . aspirin 81 MG EC tablet Take 81 mg by mouth daily.      Marland Kitchen atenolol (TENORMIN) 25 MG tablet Take 1 tablet (25 mg total) by mouth daily. 30 tablet 2  . atenolol (TENORMIN) 25 MG tablet Take 1 tablet (25 mg total) by mouth daily. X 7 days, then increase to 2 tabs ('50mg'$ ) daily 60 tablet 3  . benzonatate (TESSALON) 200 MG capsule Take 1 capsule (200 mg total) by mouth 3 (three) times daily as needed (with full glass of water). 20 capsule 0  . Blood Glucose Monitoring Suppl (ACCU-CHEK GUIDE) w/Device KIT 1 Device by Does not apply route 2 (two) times daily. Use to check blood sugars twice a day Dx. E11.9 1 kit 0  . brompheniramine-pseudoephedrine-DM 30-2-10 MG/5ML syrup Take 5 mLs by mouth 4 (four) times daily as needed. 120 mL 0  . butalbital-acetaminophen-caffeine (FIORICET, ESGIC) 50-325-40 MG tablet TAKE 1 TABLET BY MOUTH EVERY 6 HOURS AS NEEDED FOR HEADACHE (MAX OF 1 OR 2 TABLETS PER DAY) 30 tablet 1  . DENTA 5000 PLUS 1.1 % CREA dental cream   3  . Erenumab-aooe (AIMOVIG) 70 MG/ML SOAJ Inject 70 mg into the skin every 30 (thirty) days.  1 pen 0  . esomeprazole (NEXIUM) 40 MG capsule TAKE 1 CAPSULE BY MOUTH 2 TIMES DAILY BEFORE A MEAL. 180 capsule 1  . flurbiprofen (ANSAID) 100 MG tablet Take 1 tablet (100 mg total) by mouth every 8 (eight) hours as needed. Take no more than '300mg'$  in 24 hours 30 tablet 3  . gabapentin (NEURONTIN) 300 MG capsule Take 1 capsule (300 mg total) by mouth 2 (two) times daily. 60 capsule 3  . insulin aspart (NOVOLOG FLEXPEN) 100 UNIT/ML FlexPen Inject 6-10 Units into the skin 3 (three) times daily before meals. 15 mL 11  . insulin lispro (HUMALOG KWIKPEN) 100 UNIT/ML KiwkPen Inject 0.15 mLs (15 Units total) into the skin 3 (three) times daily. 30 mL 5  . Insulin Pen Needle (UNIFINE PENTIPS) 32G X  4 MM MISC USE 4x a day 200 each PRN  . JANUVIA 100 MG tablet TAKE 1 TABLET (100 MG TOTAL) BY MOUTH DAILY. 90 tablet 1  . meloxicam (MOBIC) 15 MG tablet TAKE 1 TABLET (15 MG TOTAL) BY MOUTH DAILY. 90 tablet 0  . metFORMIN (GLUCOPHAGE-XR) 500 MG 24 hr tablet TAKE 2 TABLETS BY MOUTH 2 TIMES DAILY WITH A MEAL. 120 tablet 5  . methocarbamol (ROBAXIN) 500 MG tablet Take 1 tablet (500 mg total) by mouth 2 (two) times daily as needed for muscle spasms. 30 tablet 2  . naproxen (NAPROSYN) 500 MG tablet TAKE 1 TABLET BY MOUTH 2 TIMES DAILY WITH A MEAL 60 tablet 0  . OnabotulinumtoxinA (BOTOX IJ) Inject 1 each as directed every 3 (three) months.    . ondansetron (ZOFRAN) 4 MG tablet TAKE 1 TABLET BY MOUTH EVERY 8 HOURS AS NEEDED FOR NAUSEA OR VOMITING. 20 tablet 1  . rosuvastatin (CRESTOR) 40 MG tablet Take 1 tablet (40 mg total) by mouth daily. 90 tablet 3  . traMADol (ULTRAM) 50 MG tablet TAKE 1 TABLET BY MOUTH EVERY 8 HOURS AS NEEDED 60 tablet 2  . Cetirizine HCl 10 MG CAPS Take 1 capsule (10 mg total) by mouth daily for 15 days. 15 capsule 0  . fluticasone (FLONASE) 50 MCG/ACT nasal spray Place 1-2 sprays into both nostrils daily for 7 days. 1 g 0  . SUMAtriptan Succinate (ZEMBRACE SYMTOUCH) 3 MG/0.5ML SOAJ Inject 1 Device into the skin once. May repeat dose once after 1 hour if headache persists or returns (do not exceed two injections in 24 hours) 9 pen 0   No current facility-administered medications on file prior to visit.     Review of Systems  Constitutional: Negative for other unusual diaphoresis or sweats HENT: Negative for ear discharge or swelling Eyes: Negative for other worsening visual disturbances Respiratory: Negative for stridor or other swelling  Gastrointestinal: Negative for worsening distension or other blood Genitourinary: Negative for retention or other urinary change Musculoskeletal: Negative for other MSK pain or swelling Skin: Negative for color change or other new  lesions Neurological: Negative for worsening tremors and other numbness  Psychiatric/Behavioral: Negative for worsening agitation or other fatigue All other system neg per pt    Objective:   Physical Exam BP 120/90   Pulse 90   Ht 5' (1.524 m)   Wt 143 lb (64.9 kg)   SpO2 91%   BMI 27.93 kg/m  VS noted,  Constitutional: Pt appears in NAD HENT: Head: NCAT.  Right Ear: External ear normal.  Left Ear: External ear normal.  Eyes: . Pupils are equal, round, and reactive to light. Conjunctivae and EOM are normal Nose: without  d/c or deformity Neck: Neck supple. Gross normal ROM Cardiovascular: Normal rate and regular rhythm.   Pulmonary/Chest: Effort normal and breath sounds without rales or wheezing.  Neurological: Pt is alert. At baseline orientation, motor grossly intact Skin: Skin is warm. No rashes, other new lesions, no LE edema Psychiatric: Pt behavior is normal without agitation  No other exam findings  POCT glycosylated hemoglobin (Hb A1C)  Order: [id number]  Status:  Final result  Visible to patient:  No (Not Released)  Dx:  Type 2 diabetes mellitus with hypergl...   Ref Range & Units 14:54 75moago  Hemoglobin A1C 4.0 - 5.6 % 8.6Abnormal   12.7Abnormal            Assessment & Plan:

## 2018-04-26 NOTE — Assessment & Plan Note (Signed)
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  Discussed icing regimen and home exercise.  Discussed which activities to do which was to avoid.  Patient is to increase activity slowly over the course the next several days.  Patient will follow-up with me again in 5 to 6 weeks.  In the interim we will work on Midwife.  No change in medications

## 2018-05-02 NOTE — Progress Notes (Deleted)
NEUROLOGY FOLLOW UP OFFICE NOTE  Rachel [name] [id number]  HISTORY OF PRESENT ILLNESS: Rachel [name] is a 52 year old right-handed female with type 2 diabetes mellitus, hyperlipidemia, GERD, asthma, and migraine who follows up for migraine.  UPDATE: Intensity:  *** Duration:  *** Frequency:  *** Frequency of abortive medication: *** Current NSAIDS: flurbiprofen 100 mg Current analgesics: Tramadol (for back pain) Current triptans: None Current ergotamine: None Current anti-emetic: Zofran ODT 8 mg Current muscle relaxants: Robaxin Current anti-anxiolytic: None Current sleep aide: None Current Antihypertensive medications: None Current Antidepressant medications: None Current Anticonvulsant medications: Gabapentin 300 mg at bedtime (for back pain) Current anti-CGRP: Aimovig 70 mg Current Vitamins/Herbal/Supplements: Turmeric Current Antihistamines/Decongestants: None Other therapy: OMM  Caffeine: One cup of coffee daily Diet: Weeks plenty of water.  She does not drink soda or alcohol. Exercise: No Depression: No; Anxiety: No Other pain: Chronic back pain Sleep hygiene:  ***  HISTORY: Onset: 2011.She does have remote history of headaches when she was younger. Location: Back of head and radiates to the front Quality: Shooting up from back of head, pounding on top and front of head Initial Intensity: 8/10 Aura: no Prodrome: no Associated symptoms: Nausea, photophobia, phonophobia, blurred vision (she gets annual eye exams due to diabetes) Initial Duration: All day Initial Frequency: Almost daily (cannot function 6 days per month) Triggers: Wind Relieving factors: No Activity: Cannot work about 6 days per month (she works as an Advertising account planner)  Past NSAIDS: Naproxen, Sprix nasal spray, Cambia, ibuprofen Past analgesics: Excedrin, Tylenol, Fioricet, tramadol Past abortive triptans: Sumatriptan 100 mg, Zembrace-SymTouch, Zomig 5 mg nasal spray, Maxalt Past  abortive ergotamine: Cafergot Past muscle relaxants:  *** Past anti-emetic:  *** Past antihypertensive medications: Atenolol Past antidepressant medications: Venlafaxine XR 150 mg Past anticonvulsant medications: Topiramate, Depakote Past anti-CGRP:  *** Past vitamins/Herbal/Supplements:  *** Past antihistamines/decongestants:  *** Other past therapies: Botox  Prior History of Frequent Falls: For the past few years, she has had falls, but they have become more frequent over the past year.  Previously, she was diagnosed with vertigo.  However, she reports that she doesn't note spinning or lightheadedness.  She says she "just falls".  On one occasion, she was pushing the seat forward while sitting and just fell over.  Otherwise, it always occurs while walking or on her feet.  She denies double vision or focal numbness or weakness.  MRI of the brain with seizure protocol was performed on 08/02/11, which was unremarkable.  Sleep deprived EEG was normal.  She has had PT, which was ineffective.  She does report that she sometimes trips while walking but doesn't fall.   Cervical plain films from 05/07/15 showed mild degenerative disc disease at C5-6, but otherwise unremarkable.  Lumbar films from 06/10/15 were personally reviewed and were negative.  She underwent anothner MRI of brain with and without contrast on 02/10/16, and was stable compared to prior MRI from 2013.  NCV-EMG from 05/13/16 was negative for neuropathy or lumbosacral radiculopathy.  PAST MEDICAL HISTORY: Past Medical History:  Diagnosis Date  . ALLERGIC RHINITIS 10/04/2007  . Anemia 01/21/2011  . ASTHMA 08/02/2007   INHALERS ONLY IN [place]  . Blood transfusion without reported diagnosis    with first child   . COMMON MIGRAINE 05/15/2009  . DIABETES MELLITUS, TYPE II 08/02/2007  . GERD 08/02/2007  . HYPERLIPIDEMIA 08/02/2007  . INSOMNIA-SLEEP DISORDER-UNSPEC 01/04/2008  . INTERMITTENT VERTIGO 05/15/2009  . LIBIDO, DECREASED 01/09/2010     MEDICATIONS: Current Outpatient Medications on File Prior  to Visit  Medication Sig Dispense Refill  . ACCU-CHEK FASTCLIX LANCETS MISC USE TO CHECK BLOOD SUGARS TWICE TIMES A DAY 102 each 3  . ACCU-CHEK GUIDE test strip USE TO CHECK BLOOD SUGARS TWICE A DAY DX E11.9 100 each 3  . aspirin 81 MG EC tablet Take 81 mg by mouth daily.      Marland Kitchen atenolol (TENORMIN) 25 MG tablet Take 1 tablet (25 mg total) by mouth daily. 30 tablet 2  . atenolol (TENORMIN) 25 MG tablet Take 1 tablet (25 mg total) by mouth daily. X 7 days, then increase to 2 tabs ('50mg'$ ) daily 60 tablet 3  . benzonatate (TESSALON) 200 MG capsule Take 1 capsule (200 mg total) by mouth 3 (three) times daily as needed (with full glass of water). 20 capsule 0  . Blood Glucose Monitoring Suppl (ACCU-CHEK GUIDE) w/Device KIT 1 Device by Does not apply route 2 (two) times daily. Use to check blood sugars twice a day Dx. E11.9 1 kit 0  . brompheniramine-pseudoephedrine-DM 30-2-10 MG/5ML syrup Take 5 mLs by mouth 4 (four) times daily as needed. 120 mL 0  . butalbital-acetaminophen-caffeine (FIORICET, ESGIC) 50-325-40 MG tablet TAKE 1 TABLET BY MOUTH EVERY 6 HOURS AS NEEDED FOR HEADACHE (MAX OF 1 OR 2 TABLETS PER DAY) 30 tablet 1  . Cetirizine HCl 10 MG CAPS Take 1 capsule (10 mg total) by mouth daily for 15 days. 15 capsule 0  . DENTA 5000 PLUS 1.1 % CREA dental cream   3  . Erenumab-aooe (AIMOVIG) 70 MG/ML SOAJ Inject 70 mg into the skin every 30 (thirty) days. 1 pen 0  . esomeprazole (NEXIUM) 40 MG capsule TAKE 1 CAPSULE BY MOUTH 2 TIMES DAILY BEFORE A MEAL. 180 capsule 1  . flurbiprofen (ANSAID) 100 MG tablet Take 1 tablet (100 mg total) by mouth every 8 (eight) hours as needed. Take no more than '300mg'$  in 24 hours 30 tablet 3  . fluticasone (FLONASE) 50 MCG/ACT nasal spray Place 1-2 sprays into both nostrils daily for 7 days. 1 g 0  . gabapentin (NEURONTIN) 300 MG capsule Take 1 capsule (300 mg total) by mouth 2 (two) times daily. 60 capsule 3   . insulin aspart (NOVOLOG FLEXPEN) 100 UNIT/ML FlexPen Inject 6-10 Units into the skin 3 (three) times daily before meals. 15 mL 11  . Insulin Glargine (LANTUS SOLOSTAR) 100 UNIT/ML Solostar Pen INJECT 40 UNITS IN THE MORNING. 33 mL 1  . insulin lispro (HUMALOG KWIKPEN) 100 UNIT/ML KiwkPen Inject 0.15 mLs (15 Units total) into the skin 3 (three) times daily. 30 mL 5  . Insulin Pen Needle (UNIFINE PENTIPS) 32G X 4 MM MISC USE 4x a day 200 each PRN  . JANUVIA 100 MG tablet TAKE 1 TABLET (100 MG TOTAL) BY MOUTH DAILY. 90 tablet 1  . meloxicam (MOBIC) 15 MG tablet TAKE 1 TABLET (15 MG TOTAL) BY MOUTH DAILY. 90 tablet 0  . metFORMIN (GLUCOPHAGE-XR) 500 MG 24 hr tablet TAKE 2 TABLETS BY MOUTH 2 TIMES DAILY WITH A MEAL. 120 tablet 5  . methocarbamol (ROBAXIN) 500 MG tablet Take 1 tablet (500 mg total) by mouth 2 (two) times daily as needed for muscle spasms. 30 tablet 2  . naproxen (NAPROSYN) 500 MG tablet TAKE 1 TABLET BY MOUTH 2 TIMES DAILY WITH A MEAL 60 tablet 0  . OnabotulinumtoxinA (BOTOX IJ) Inject 1 each as directed every 3 (three) months.    . ondansetron (ZOFRAN) 4 MG tablet TAKE 1 TABLET BY MOUTH EVERY 8  HOURS AS NEEDED FOR NAUSEA OR VOMITING. 20 tablet 1  . rosuvastatin (CRESTOR) 40 MG tablet Take 1 tablet (40 mg total) by mouth daily. 90 tablet 3  . SUMAtriptan Succinate (ZEMBRACE SYMTOUCH) 3 MG/0.5ML SOAJ Inject 1 Device into the skin once. May repeat dose once after 1 hour if headache persists or returns (do not exceed two injections in 24 hours) 9 pen 0  . traMADol (ULTRAM) 50 MG tablet TAKE 1 TABLET BY MOUTH EVERY 8 HOURS AS NEEDED 60 tablet 2   No current facility-administered medications on file prior to visit.     ALLERGIES: Allergies  Allergen Reactions  . Lipitor [Atorvastatin]     REACTION: myalgias  . Nitroglycerin     FAMILY HISTORY: Family History  Problem Relation Age of Onset  . Hypertension Father   . Diabetes Father   . Asthma Father   . Cervical cancer  Maternal Aunt   . Heart disease Maternal Aunt   . Anesthesia problems Neg Hx   . Colon cancer Neg Hx    ***.  SOCIAL HISTORY: Social History   Socioeconomic History  . Marital status: Married    Spouse name: [name]  . Number of children: 3  . Years of education: Degree  . Highest education level: Not on file  Occupational History  . Occupation: Research scientist (life sciences): [employer]  . Occupation: Chartered certified accountant    Employer: [employer]  . Financial resource strain: Not on file  . Food insecurity:    Worry: Not on file    Inability: Not on file  . Transportation needs:    Medical: Not on file    Non-medical: Not on file  Tobacco Use  . Smoking status: Never Smoker  . Smokeless tobacco: Never Used  Substance and Sexual Activity  . Alcohol use: No    Alcohol/week: 0.0 standard drinks  . Drug use: No  . Sexual activity: Yes  Lifestyle  . Physical activity:    Days per week: Not on file    Minutes per session: Not on file  . Stress: Not on file  Relationships  . Social connections:    Talks on phone: Not on file    Gets together: Not on file    Attends religious service: Not on file    Active member of club or organization: Not on file    Attends meetings of clubs or organizations: Not on file    Relationship status: Not on file  . Intimate partner violence:    Fear of current or ex partner: Not on file    Emotionally abused: Not on file    Physically abused: Not on file    Forced sexual activity: Not on file  Other Topics Concern  . Not on file  Social History Narrative   She has 3 daughters.   Patient has a 4 year degree.    Patient working at [employer].    Patient is married to [place].    REVIEW OF SYSTEMS: Constitutional: No fevers, chills, or sweats, no generalized fatigue, change in appetite Eyes: No visual changes, double vision, eye pain Ear, nose and throat: No hearing loss, ear pain, nasal congestion, sore  throat Cardiovascular: No chest pain, palpitations Respiratory:  No shortness of breath at rest or with exertion, wheezes GastrointestinaI: No nausea, vomiting, diarrhea, abdominal pain, fecal incontinence Genitourinary:  No dysuria, urinary retention or frequency Musculoskeletal:  No neck pain, back pain Integumentary: No  rash, pruritus, skin lesions Neurological: as above Psychiatric: No depression, insomnia, anxiety Endocrine: No palpitations, fatigue, diaphoresis, mood swings, change in appetite, change in weight, increased thirst Hematologic/Lymphatic:  No purpura, petechiae. Allergic/Immunologic: no itchy/runny eyes, nasal congestion, recent allergic reactions, rashes  PHYSICAL EXAM: *** General: No acute distress.  Patient appears ***-groomed.  *** body habitus. Head:  Normocephalic/atraumatic Eyes:  Fundi examined but not visualized Neck: supple, no paraspinal tenderness, full range of motion Heart:  Regular rate and rhythm Lungs:  Clear to auscultation bilaterally Back: No paraspinal tenderness Neurological Exam: alert and oriented to person, place, and time. Attention span and concentration intact, recent and remote memory intact, fund of knowledge intact.  Speech fluent and not dysarthric, language intact.  CN II-XII intact. Bulk and tone normal, muscle strength 5/5 throughout.  Sensation to light touch, temperature and vibration intact.  Deep tendon reflexes 2+ throughout, toes downgoing.  Finger to nose and heel to shin testing intact.  Gait normal, Romberg negative.  IMPRESSION: ***  PLAN: ***  Metta Clines, DO  CC: ***

## 2018-05-03 ENCOUNTER — Ambulatory Visit (INDEPENDENT_AMBULATORY_CARE_PROVIDER_SITE_OTHER): Payer: 59 | Admitting: Neurology

## 2018-05-03 ENCOUNTER — Encounter: Payer: Self-pay | Admitting: Neurology

## 2018-05-03 ENCOUNTER — Ambulatory Visit: Payer: 59 | Admitting: Neurology

## 2018-05-03 VITALS — BP 96/72 | HR 95 | Ht 61.0 in | Wt 146.0 lb

## 2018-05-03 DIAGNOSIS — G43019 Migraine without aura, intractable, without status migrainosus: Secondary | ICD-10-CM

## 2018-05-03 MED ORDER — FLURBIPROFEN 100 MG PO TABS: 100.0000 mg | ORAL_TABLET | Freq: Three times a day (TID) | ORAL | 3 refills | Status: DC | PRN

## 2018-05-03 NOTE — Patient Instructions (Addendum)
1.  Continue Aimovig monthly 2.  At earliest onset of headache, take flurbiprofen 100mg .  May take every 8 hours, no more than 3 tablets in 24 hours. 3.  Keep headache diary 4.  Follow up in 3 to 4 months.

## 2018-05-03 NOTE — Progress Notes (Signed)
NEUROLOGY FOLLOW UP OFFICE NOTE  Rachel [name] [id number]  HISTORY OF PRESENT ILLNESS: Rachel [name] is a 52 year old right-handed female with type 2 diabetes, hyperlipidemia, GERD, asthma, and migraine who follows up for migraine.  UPDATE: Intensity:  severe Duration:  Several hours Frequency:  3 to 4 days in past 30 days Current NSAIDS:  Ibuprofen.  Flurbiprofen 176m (doesn't remember taking it).  Had an extra Sprix but headaches return after a couple of hours Current analgesics: Excedrin.  Tramadol (for back pain, ineffective for headache) Current triptans: None Current ergotamine: None Current anti-emetic: Zofran ODT 8 mg Current muscle relaxants: Robaxin Current anti-anxiolytic: None Current sleep aide: None Current Antihypertensive medications: None Current Antidepressant medications: None Current Anticonvulsant medications: Gabapentin 300 mg at bedtime (for back pain) Current anti-CGRP: Aimovig 70 mg (second injection last week) Current Vitamins/Herbal/Supplements: Turmeric Current Antihistamines/Decongestants: None Other therapy: OMM  Caffeine: One cup of coffee daily Diet: Drinks plenty of water.  No soda. Exercise: No Depression: No; Anxiety: yes.  Her mother has been ill.  She has been confused.  Other pain: Knee pain. Sleep hygiene: Poor.  Worrying about her mother.  HISTORY: Onset: 2011. She does have remote history of headaches when she was younger. Location: Back of head and radiates to the front Quality: Shooting up from back of head, pounding on top and front of head Initial Intensity: 8/10 Aura: no Prodrome: no Associated symptoms: Nausea, photophobia, phonophobia, blurred vision (she gets annual eye exams due to diabetes) Initial Duration: All day Initial Frequency: Almost daily (cannot function 6 days per month) Triggers/exacerbating factors: wine Relieving factors: No Activity: Cannot work about 6 days per month (she works as an oProgrammer, systems  Past NSAIDS: Cambia (made headache worse), naproxen, Sprix nasal spray Past analgesics: Fioricet, Excedrin, Tylenol Past abortive triptans: Sumatriptan 100 mg, Zembrace-SymTouch (did not like the feeling), Zomig 5 mg NS (did not like the way it made her feel), Maxalt (dizziness, ineffective) Past abortive ergotamine: Cafergot Past muscle relaxants: None Past anti-emetic: None Past antihypertensive medications: Atenolol Past antidepressant medications: Venlafaxine Exar 150 mg Past anticonvulsant medications: Topiramate, Depakote Past anti-CGRP: None Past vitamins/Herbal/Supplements: None Past antihistamines/decongestants: None Other past therapies: Botox  Frequent Falls: For the past few years, she has had falls, but they have become more frequent over the past year.  Previously, she was diagnosed with vertigo.  However, she reports that she doesn't note spinning or lightheadedness.  She says she "just falls".  On one occasion, she was pushing the seat forward while sitting and just fell over.  Otherwise, it always occurs while walking or on her feet.  She denies double vision or focal numbness or weakness.  MRI of the brain with seizure protocol was performed on 08/02/11, which was unremarkable.  Sleep deprived EEG was normal.  She has had PT, which was ineffective.  She does report that she sometimes trips while walking but doesn't fall.   Cervical plain films from 05/07/15 showed mild degenerative disc disease at C5-6, but otherwise unremarkable.  Lumbar films from 06/10/15 were personally reviewed and were negative.  She underwent anothner MRI of brain with and without contrast on 02/10/16, and was stable compared to prior MRI from 2013.  NCV-EMG from 05/13/16 was negative for neuropathy or lumbosacral radiculopathy.  PAST MEDICAL HISTORY: Past Medical History:  Diagnosis Date  . ALLERGIC RHINITIS 10/04/2007  . Anemia 01/21/2011  . ASTHMA 08/02/2007   [place]  . Blood transfusion without reported diagnosis  with first child   . COMMON MIGRAINE 05/15/2009  . DIABETES MELLITUS, TYPE II 08/02/2007  . GERD 08/02/2007  . HYPERLIPIDEMIA 08/02/2007  . INSOMNIA-SLEEP DISORDER-UNSPEC 01/04/2008  . INTERMITTENT VERTIGO 05/15/2009  . LIBIDO, DECREASED 01/09/2010    MEDICATIONS: Current Outpatient Medications on File Prior to Visit  Medication Sig Dispense Refill  . ACCU-CHEK FASTCLIX LANCETS MISC USE TO CHECK BLOOD SUGARS TWICE TIMES A DAY 102 each 3  . ACCU-CHEK GUIDE test strip USE TO CHECK BLOOD SUGARS TWICE A DAY DX E11.9 100 each 3  . aspirin 81 MG EC tablet Take 81 mg by mouth daily.      Marland Kitchen atenolol (TENORMIN) 25 MG tablet Take 1 tablet (25 mg total) by mouth daily. 30 tablet 2  . atenolol (TENORMIN) 25 MG tablet Take 1 tablet (25 mg total) by mouth daily. X 7 days, then increase to 2 tabs (33m) daily 60 tablet 3  . benzonatate (TESSALON) 200 MG capsule Take 1 capsule (200 mg total) by mouth 3 (three) times daily as needed (with full glass of water). 20 capsule 0  . Blood Glucose Monitoring Suppl (ACCU-CHEK GUIDE) w/Device KIT 1 Device by Does not apply route 2 (two) times daily. Use to check blood sugars twice a day Dx. E11.9 1 kit 0  . brompheniramine-pseudoephedrine-DM 30-2-10 MG/5ML syrup Take 5 mLs by mouth 4 (four) times daily as needed. 120 mL 0  . butalbital-acetaminophen-caffeine (FIORICET, ESGIC) 50-325-40 MG tablet TAKE 1 TABLET BY MOUTH EVERY 6 HOURS AS NEEDED FOR HEADACHE (MAX OF 1 OR 2 TABLETS PER DAY) 30 tablet 1  . Cetirizine HCl 10 MG CAPS Take 1 capsule (10 mg total) by mouth daily for 15 days. 15 capsule 0  . DENTA 5000 PLUS 1.1 % CREA dental cream   3  . Erenumab-aooe (AIMOVIG) 70 MG/ML SOAJ Inject 70 mg into the skin every 30 (thirty) days. 1 pen 0  . esomeprazole (NEXIUM) 40 MG capsule TAKE 1 CAPSULE BY MOUTH 2 TIMES DAILY BEFORE A MEAL. 180 capsule 1  . flurbiprofen (ANSAID) 100 MG tablet Take 1 tablet (100 mg total) by mouth  every 8 (eight) hours as needed. Take no more than 3033min 24 hours 30 tablet 3  . fluticasone (FLONASE) 50 MCG/ACT nasal spray Place 1-2 sprays into both nostrils daily for 7 days. 1 g 0  . gabapentin (NEURONTIN) 300 MG capsule Take 1 capsule (300 mg total) by mouth 2 (two) times daily. 60 capsule 3  . insulin aspart (NOVOLOG FLEXPEN) 100 UNIT/ML FlexPen Inject 6-10 Units into the skin 3 (three) times daily before meals. 15 mL 11  . Insulin Glargine (LANTUS SOLOSTAR) 100 UNIT/ML Solostar Pen INJECT 40 UNITS IN THE MORNING. 33 mL 1  . insulin lispro (HUMALOG KWIKPEN) 100 UNIT/ML KiwkPen Inject 0.15 mLs (15 Units total) into the skin 3 (three) times daily. 30 mL 5  . Insulin Pen Needle (UNIFINE PENTIPS) 32G X 4 MM MISC USE 4x a day 200 each PRN  . JANUVIA 100 MG tablet TAKE 1 TABLET (100 MG TOTAL) BY MOUTH DAILY. 90 tablet 1  . meloxicam (MOBIC) 15 MG tablet TAKE 1 TABLET (15 MG TOTAL) BY MOUTH DAILY. 90 tablet 0  . metFORMIN (GLUCOPHAGE-XR) 500 MG 24 hr tablet TAKE 2 TABLETS BY MOUTH 2 TIMES DAILY WITH A MEAL. 120 tablet 5  . methocarbamol (ROBAXIN) 500 MG tablet Take 1 tablet (500 mg total) by mouth 2 (two) times daily as needed for muscle spasms. 30 tablet 2  .  naproxen (NAPROSYN) 500 MG tablet TAKE 1 TABLET BY MOUTH 2 TIMES DAILY WITH A MEAL 60 tablet 0  . OnabotulinumtoxinA (BOTOX IJ) Inject 1 each as directed every 3 (three) months.    . ondansetron (ZOFRAN) 4 MG tablet TAKE 1 TABLET BY MOUTH EVERY 8 HOURS AS NEEDED FOR NAUSEA OR VOMITING. 20 tablet 1  . rosuvastatin (CRESTOR) 40 MG tablet Take 1 tablet (40 mg total) by mouth daily. 90 tablet 3  . SUMAtriptan Succinate (ZEMBRACE SYMTOUCH) 3 MG/0.5ML SOAJ Inject 1 Device into the skin once. May repeat dose once after 1 hour if headache persists or returns (do not exceed two injections in 24 hours) 9 pen 0  . traMADol (ULTRAM) 50 MG tablet TAKE 1 TABLET BY MOUTH EVERY 8 HOURS AS NEEDED 60 tablet 2   No current facility-administered medications  on file prior to visit.     ALLERGIES: Allergies  Allergen Reactions  . Lipitor [Atorvastatin]     REACTION: myalgias  . Nitroglycerin     FAMILY HISTORY: Family History  Problem Relation Age of Onset  . Hypertension Father   . Diabetes Father   . Asthma Father   . Cervical cancer Maternal Aunt   . Heart disease Maternal Aunt   . Anesthesia problems Neg Hx   . Colon cancer Neg Hx    SOCIAL HISTORY: Social History   Socioeconomic History  . Marital status: Married    Spouse name: [name]  . Number of children: 3  . Years of education: Degree  . Highest education level: Not on file  Occupational History  . Occupation: Research scientist (life sciences): [employer]  . Occupation: Chartered certified accountant    Employer: [employer]  . Financial resource strain: Not on file  . Food insecurity:    Worry: Not on file    Inability: Not on file  . Transportation needs:    Medical: Not on file    Non-medical: Not on file  Tobacco Use  . Smoking status: Never Smoker  . Smokeless tobacco: Never Used  Substance and Sexual Activity  . Alcohol use: No    Alcohol/week: 0.0 standard drinks  . Drug use: No  . Sexual activity: Yes  Lifestyle  . Physical activity:    Days per week: Not on file    Minutes per session: Not on file  . Stress: Not on file  Relationships  . Social connections:    Talks on phone: Not on file    Gets together: Not on file    Attends religious service: Not on file    Active member of club or organization: Not on file    Attends meetings of clubs or organizations: Not on file    Relationship status: Not on file  . Intimate partner violence:    Fear of current or ex partner: Not on file    Emotionally abused: Not on file    Physically abused: Not on file    Forced sexual activity: Not on file  Other Topics Concern  . Not on file  Social History Narrative   She has 3 daughters.   Patient has a 4 year degree.    Patient working at [employer].     Patient is married to [place].    REVIEW OF SYSTEMS: Constitutional: No fevers, chills, or sweats, no generalized fatigue, change in appetite Eyes: No visual changes, double vision, eye pain Ear, nose and throat: No hearing loss, ear pain,  nasal congestion, sore throat Cardiovascular: No chest pain, palpitations Respiratory:  No shortness of breath at rest or with exertion, wheezes GastrointestinaI: No nausea, vomiting, diarrhea, abdominal pain, fecal incontinence Genitourinary:  No dysuria, urinary retention or frequency Musculoskeletal:  Knee pain Integumentary: No rash, pruritus, skin lesions Neurological: as above Psychiatric: anxiety Endocrine: No palpitations, fatigue, diaphoresis, mood swings, change in appetite, change in weight, increased thirst Hematologic/Lymphatic:  No purpura, petechiae. Allergic/Immunologic: no itchy/runny eyes, nasal congestion, recent allergic reactions, rashes  PHYSICAL EXAM: Blood pressure 96/72, pulse 95, height _0  (1.549 m), weight 146 lb (66.2 kg), SpO2 97 %. General: No acute distress.  Patient appears well-groomed.   Head:  Normocephalic/atraumatic Eyes:  Fundi examined but not visualized Neck: supple, no paraspinal tenderness, full range of motion Heart:  Regular rate and rhythm Lungs:  Clear to auscultation bilaterally Back: No paraspinal tenderness Neurological Exam: alert and oriented to person, place, and time. Attention span and concentration intact, recent and remote memory intact, fund of knowledge intact.  Speech fluent and not dysarthric, language intact.  CN II-XII intact. Bulk and tone normal, muscle strength 5/5 throughout.  Sensation to light touch intact.  Deep tendon reflexes 2+ throughout, toes downgoing.  Finger to nose testing intact.  Gait normal, Romberg negative.  IMPRESSION: Migraine without aura, without status migrainosus, intractable, improved  PLAN: 1.  She will continue Aimovig 53m monthly 2.  She would like  to try the flurbiprofen for abortive therapy.  Sprix may still be an option in the future as she reportedly had not been taking it correctly. 3.  Limit use of pain relievers to no more than 2 days out of week to prevent risk of rebound or medication-overuse headache. 4.  Keep headache diary 5.  Follow up in 3 to 4 months.  AMetta Clines DO  CC: JCathlean Cower MD

## 2018-05-23 ENCOUNTER — Telehealth: Payer: Self-pay | Admitting: Family Medicine

## 2018-05-23 NOTE — Telephone Encounter (Signed)
Schedule patient for 12/12.

## 2018-05-23 NOTE — Telephone Encounter (Signed)
Copied from [place] [id number]. Topic: Appointment Scheduling - Scheduling Inquiry for Clinic >> May 23, 2018  8:33 AM Cecelia Byars, NT wrote: Reason for CRM: Patient had to cancel her appointment  with Dr [name] due to her mom being in the hospital  and she needs  for it to be rescheduled on a Wednesday for work purposes  please call her at [phone]

## 2018-05-24 ENCOUNTER — Ambulatory Visit: Payer: 59 | Admitting: Family Medicine

## 2018-05-25 ENCOUNTER — Encounter: Payer: Self-pay | Admitting: Family Medicine

## 2018-05-25 ENCOUNTER — Ambulatory Visit (INDEPENDENT_AMBULATORY_CARE_PROVIDER_SITE_OTHER): Payer: 59 | Admitting: Family Medicine

## 2018-05-25 ENCOUNTER — Other Ambulatory Visit: Payer: Self-pay | Admitting: Internal Medicine

## 2018-05-25 VITALS — BP 120/80 | HR 103 | Ht 61.0 in | Wt 146.0 lb

## 2018-05-25 DIAGNOSIS — G4486 Cervicogenic headache: Secondary | ICD-10-CM

## 2018-05-25 DIAGNOSIS — M9904 Segmental and somatic dysfunction of sacral region: Secondary | ICD-10-CM

## 2018-05-25 DIAGNOSIS — M999 Biomechanical lesion, unspecified: Secondary | ICD-10-CM

## 2018-05-25 DIAGNOSIS — R51 Headache: Secondary | ICD-10-CM | POA: Diagnosis not present

## 2018-05-25 DIAGNOSIS — M9903 Segmental and somatic dysfunction of lumbar region: Secondary | ICD-10-CM

## 2018-05-25 DIAGNOSIS — M9908 Segmental and somatic dysfunction of rib cage: Secondary | ICD-10-CM

## 2018-05-25 DIAGNOSIS — M9902 Segmental and somatic dysfunction of thoracic region: Secondary | ICD-10-CM

## 2018-05-25 DIAGNOSIS — M9901 Segmental and somatic dysfunction of cervical region: Secondary | ICD-10-CM

## 2018-05-25 MED ORDER — HYDROXYZINE HCL 10 MG PO TABS: 10.0000 mg | ORAL_TABLET | Freq: Three times a day (TID) | ORAL | 0 refills | Status: DC | PRN

## 2018-05-25 NOTE — Patient Instructions (Signed)
Good to see you  Hydroxyzine up to 3 times a day for anxiety.   Continue the high dose of the gabapentin  Try to find a little time for yourself See me again in 4-5 weeks

## 2018-05-25 NOTE — Assessment & Plan Note (Signed)
Decision today to treat with OMT was based on Physical Exam  After verbal consent patient was treated with HVLA, ME, FPR techniques in cervical, thoracic, rib, lumbar and sacral areas  Patient tolerated the procedure well with improvement in symptoms  Patient given exercises, stretches and lifestyle modifications  See medications in patient instructions if given  Patient will follow up in 4-5 weeks 

## 2018-05-25 NOTE — Telephone Encounter (Signed)
Done erx 

## 2018-05-25 NOTE — Progress Notes (Signed)
[name] [address], [place] [address] Phone: [phone] Subjective:   [name], am serving as a scribe for Dr. Hulan Saas.   CC: Low back pain  GGY:[id number]  Rachel [name] is a 52 y.o. female coming in with complaint of lower back pain. Patient said that she has been having a lot of headaches due to neck and back pain. She has been taking care of her mother and sleeping in the hospital which has increased her pain.  Has noticed more neck pain recently as well.  Increasing in headaches.  Patient's neurologist did increase gabapentin to 300 mg.      Past Medical History:  Diagnosis Date  . ALLERGIC RHINITIS 10/04/2007  . Anemia 01/21/2011  . ASTHMA 08/02/2007   INHALERS ONLY IN [place]  . Blood transfusion without reported diagnosis    with first child   . COMMON MIGRAINE 05/15/2009  . DIABETES MELLITUS, TYPE II 08/02/2007  . GERD 08/02/2007  . HYPERLIPIDEMIA 08/02/2007  . INSOMNIA-SLEEP DISORDER-UNSPEC 01/04/2008  . INTERMITTENT VERTIGO 05/15/2009  . LIBIDO, DECREASED 01/09/2010   Past Surgical History:  Procedure Laterality Date  . CESAREAN SECTION     x 3  . CYST EXCISION     left knee  . PAROTIDECTOMY  10/21/2011   Procedure: PAROTIDECTOMY;  Surgeon: Melida Quitter, MD;  Location: [facility] OR;  Service: ENT;  Laterality: Left;   Social History   Socioeconomic History  . Marital status: Married    Spouse name: [name]  . Number of children: 3  . Years of education: Degree  . Highest education level: Not on file  Occupational History  . Occupation: Research scientist (life sciences): [employer]  . Occupation: Chartered certified accountant    Employer: [employer]  . Financial resource strain: Not on file  . Food insecurity:    Worry: Not on file    Inability: Not on file  . Transportation needs:    Medical: Not on file    Non-medical: Not on file  Tobacco Use  . Smoking status: Never Smoker  . Smokeless tobacco: Never Used    Substance and Sexual Activity  . Alcohol use: No    Alcohol/week: 0.0 standard drinks  . Drug use: No  . Sexual activity: Yes  Lifestyle  . Physical activity:    Days per week: Not on file    Minutes per session: Not on file  . Stress: Not on file  Relationships  . Social connections:    Talks on phone: Not on file    Gets together: Not on file    Attends religious service: Not on file    Active member of club or organization: Not on file    Attends meetings of clubs or organizations: Not on file    Relationship status: Not on file  Other Topics Concern  . Not on file  Social History Narrative   She has 3 daughters.   Patient has a 4 year degree.    Patient working at [employer].    Patient is married to [place].   Allergies  Allergen Reactions  . Lipitor [Atorvastatin]     REACTION: myalgias  . Nitroglycerin    Family History  Problem Relation Age of Onset  . Hypertension Father   . Diabetes Father   . Asthma Father   . Cervical cancer Maternal Aunt   . Heart disease Maternal Aunt   . Anesthesia  problems Neg Hx   . Colon cancer Neg Hx     Current Outpatient Medications (Endocrine & Metabolic):  .  insulin aspart (NOVOLOG FLEXPEN) 100 UNIT/ML FlexPen, Inject 6-10 Units into the skin 3 (three) times daily before meals. .  Insulin Glargine (LANTUS SOLOSTAR) 100 UNIT/ML Solostar Pen, INJECT 40 UNITS IN THE MORNING. .  insulin lispro (HUMALOG KWIKPEN) 100 UNIT/ML KiwkPen, Inject 0.15 mLs (15 Units total) into the skin 3 (three) times daily. Marland Kitchen  JANUVIA 100 MG tablet, TAKE 1 TABLET (100 MG TOTAL) BY MOUTH DAILY. .  metFORMIN (GLUCOPHAGE-XR) 500 MG 24 hr tablet, TAKE 2 TABLETS BY MOUTH 2 TIMES DAILY WITH A MEAL.  Current Outpatient Medications (Cardiovascular):  .  atenolol (TENORMIN) 25 MG tablet, Take 1 tablet (25 mg total) by mouth daily. Marland Kitchen  atenolol (TENORMIN) 25 MG tablet, Take 1 tablet (25 mg total) by mouth daily. X 7 days, then increase to 2 tabs ('50mg'$ ) daily .   rosuvastatin (CRESTOR) 40 MG tablet, Take 1 tablet (40 mg total) by mouth daily.  Current Outpatient Medications (Respiratory):  .  benzonatate (TESSALON) 200 MG capsule, Take 1 capsule (200 mg total) by mouth 3 (three) times daily as needed (with full glass of water). .  brompheniramine-pseudoephedrine-DM 30-2-10 MG/5ML syrup, Take 5 mLs by mouth 4 (four) times daily as needed. .  Cetirizine HCl 10 MG CAPS, Take 1 capsule (10 mg total) by mouth daily for 15 days. .  fluticasone (FLONASE) 50 MCG/ACT nasal spray, Place 1-2 sprays into both nostrils daily for 7 days.  Current Outpatient Medications (Analgesics):  .  aspirin 81 MG EC tablet, Take 81 mg by mouth daily.   .  butalbital-acetaminophen-caffeine (FIORICET, ESGIC) 50-325-40 MG tablet, TAKE 1 TABLET BY MOUTH EVERY 6 HOURS AS NEEDED FOR HEADACHE (MAX OF 1 OR 2 TABLETS PER DAY) .  Erenumab-aooe (AIMOVIG) 70 MG/ML SOAJ, Inject 70 mg into the skin every 30 (thirty) days. .  flurbiprofen (ANSAID) 100 MG tablet, Take 1 tablet (100 mg total) by mouth every 8 (eight) hours as needed. Take no more than '300mg'$  in 24 hours .  meloxicam (MOBIC) 15 MG tablet, TAKE 1 TABLET (15 MG TOTAL) BY MOUTH DAILY. .  naproxen (NAPROSYN) 500 MG tablet, TAKE 1 TABLET BY MOUTH 2 TIMES DAILY WITH A MEAL .  traMADol (ULTRAM) 50 MG tablet, TAKE 1 TABLET BY MOUTH EVERY 8 HOURS AS NEEDED .  SUMAtriptan Succinate (ZEMBRACE SYMTOUCH) 3 MG/0.5ML SOAJ, Inject 1 Device into the skin once. May repeat dose once after 1 hour if headache persists or returns (do not exceed two injections in 24 hours)   Current Outpatient Medications (Other):  Marland Kitchen  ACCU-CHEK FASTCLIX LANCETS MISC, USE TO CHECK BLOOD SUGARS TWICE TIMES A DAY .  ACCU-CHEK GUIDE test strip, USE TO CHECK BLOOD SUGARS TWICE A DAY DX E11.9 .  Blood Glucose Monitoring Suppl (ACCU-CHEK GUIDE) w/Device KIT, 1 Device by Does not apply route 2 (two) times daily. Use to check blood sugars twice a day Dx. E11.9 .  DENTA 5000 PLUS  1.1 % CREA dental cream,  .  esomeprazole (NEXIUM) 40 MG capsule, TAKE 1 CAPSULE BY MOUTH 2 TIMES DAILY BEFORE A MEAL. Marland Kitchen  gabapentin (NEURONTIN) 300 MG capsule, Take 1 capsule (300 mg total) by mouth 2 (two) times daily. .  Insulin Pen Needle (UNIFINE PENTIPS) 32G X 4 MM MISC, USE 4x a day .  methocarbamol (ROBAXIN) 500 MG tablet, Take 1 tablet (500 mg total) by mouth 2 (two) times  daily as needed for muscle spasms. .  OnabotulinumtoxinA (BOTOX IJ), Inject 1 each as directed every 3 (three) months. .  ondansetron (ZOFRAN) 4 MG tablet, TAKE 1 TABLET BY MOUTH EVERY 8 HOURS AS NEEDED FOR NAUSEA OR VOMITING. .  hydrOXYzine (ATARAX/VISTARIL) 10 MG tablet, Take 1 tablet (10 mg total) by mouth 3 (three) times daily as needed.    Past medical history, social, surgical and family history all reviewed in electronic medical record.  No pertanent information unless stated regarding to the chief complaint.   Review of Systems:  No headache, visual changes, nausea, vomiting, diarrhea, constipation, dizziness, abdominal pain, skin rash, fevers, chills, night sweats, weight loss, swollen lymph nodes, body aches, joint swelling,  chest pain, shortness of breath, mood changes.  Positive muscle aches  Objective  Blood pressure 120/80, pulse (!) 103, height '5\' 1"'$  (1.549 m), weight 146 lb (66.2 kg), SpO2 98 %.    General: No apparent distress alert and oriented x3 mood and affect normal, dressed appropriately.  HEENT: Pupils equal, extraocular movements intact  Respiratory: Patient's speak in full sentences and does not appear short of breath  Cardiovascular: No lower extremity edema, non tender, no erythema  Skin: Warm dry intact with no signs of infection or rash on extremities or on axial skeleton.  Abdomen: Soft nontender  Neuro: Cranial nerves II through XII are intact, neurovascularly intact in all extremities with 2+ DTRs and 2+ pulses.  Lymph: No lymphadenopathy of posterior or anterior cervical chain  or axillae bilaterally.  Gait normal with good balance and coordination.  MSK:  Non tender with full range of motion and good stability and symmetric strength and tone of shoulders, elbows, wrist, hip, knee and ankles bilaterally.  -Neck: Inspection significant loss of lordosis. No palpable stepoffs. Negative Spurling's maneuver. Limited range of motion in all planes by 5 to 10 degrees. Grip strength and sensation normal in bilateral hands Strength good C4 to T1 distribution No sensory change to C4 to T1 Negative Hoffman sign bilaterally Reflexes normal Severe tightness noted in the paraspinal musculature of the left parascapular region.  Osteopathic findings C4 flexed rotated and side bent left T3 extended rotated and side bent left inhaled third rib T5 extended rotated and side bent left L3 flexed rotated and side bent right Sacrum right on right     Impression and Recommendations:     This case required medical decision making of moderate complexity. The above documentation has been reviewed and is accurate and complete Lyndal Pulley, DO       Note: This dictation was prepared with Dragon dictation along with smaller phrase technology. Any transcriptional errors that result from this process are unintentional.

## 2018-05-25 NOTE — Assessment & Plan Note (Signed)
Patient has cervicogenic headache dizziness while patient's mother has been sick recently.  This is likely contributing to the stress and causing exacerbation of patient's underlying condition.  Patient encouraged to take the 300 mg gabapentin.  Patient given hydroxyzine for any breakthrough anxiety that I think can be beneficial.  Discussed with patient to follow-up with her primary care provider to see if any other medications can be beneficial for this time.  Patient will follow-up with me again in 4 to 5 weeks

## 2018-06-23 ENCOUNTER — Telehealth: Payer: Self-pay | Admitting: Internal Medicine

## 2018-06-23 NOTE — Telephone Encounter (Signed)
Copied from [place] [id number]. Topic: General - Other >> Jun 23, 2018 10:59 AM Keene Breath wrote: Reason for CRM: Patient called to inform the doctor that patient needs a referral for Dr. [name] because of her new insurance.  Patient stated that she has already seen this doctor, but the new insurance is now asking for a referral.  Patient has an appointment with Dr. [name] on Wednesday and would need referral before then.  She also needs a referral for the neurologist that she sees as well.  Please advise and call patient back with any questions.  CB# [phone]

## 2018-06-23 NOTE — Telephone Encounter (Signed)
Pt notified referral needs to come from insurance company

## 2018-06-26 ENCOUNTER — Other Ambulatory Visit: Payer: Self-pay | Admitting: Internal Medicine

## 2018-06-26 ENCOUNTER — Other Ambulatory Visit: Payer: Self-pay | Admitting: Neurology

## 2018-06-28 ENCOUNTER — Ambulatory Visit: Payer: No Typology Code available for payment source | Admitting: Family Medicine

## 2018-06-28 VITALS — BP 106/76 | HR 99 | Ht 61.0 in | Wt 145.0 lb

## 2018-06-28 DIAGNOSIS — G4486 Cervicogenic headache: Secondary | ICD-10-CM

## 2018-06-28 DIAGNOSIS — M999 Biomechanical lesion, unspecified: Secondary | ICD-10-CM

## 2018-06-28 DIAGNOSIS — R51 Headache: Secondary | ICD-10-CM

## 2018-06-28 NOTE — Assessment & Plan Note (Signed)
Patient is having more cervicogenic headache.  Saw neurology and increase gabapentin to 300 mg.  Hopefully this will continue to make some improvement.  Attempted osteopathic manipulation.  Discussed posture ergonomics and proper lifting mechanics.  Patient will continue to try to do same activities.  Follow-up with me again 4 weeks.  Do believe stress is playing a role

## 2018-06-28 NOTE — Assessment & Plan Note (Signed)
Decision today to treat with OMT was based on Physical Exam  After verbal consent patient was treated with HVLA, ME, FPR techniques in cervical, thoracic, rib lumbar and sacral areas  Patient tolerated the procedure well with improvement in symptoms  Patient given exercises, stretches and lifestyle modifications  See medications in patient instructions if given  Patient will follow up in 4 weeks 

## 2018-06-28 NOTE — Patient Instructions (Signed)
Good to see you  Ice is you friend Please try to find time for yourself Calcium pyruvate 1500mg  daily  See me again in 4 weeks

## 2018-06-28 NOTE — Progress Notes (Signed)
[name] [address], [place] [address] Phone: [phone] Subjective:   [name], am serving as a scribe for Dr. Hulan Saas.   CC: Back pain and neck pain follow-up  [id number]  Rachel [name] is a 53 y.o. female coming in with complaint of neck and back pain. Is still having pain throughout the entire spine. She continues to be under a lot of stress taking care of her mother who is in the hospital. Has been able to manage her pain with OMT. Patient states it is having a lot of increased stress.  Increased tightness of the neck in the mid back.  No radiation to any extremities at the moment     Past Medical History:  Diagnosis Date  . ALLERGIC RHINITIS 10/04/2007  . Anemia 01/21/2011  . ASTHMA 08/02/2007   INHALERS ONLY IN [place]  . Blood transfusion without reported diagnosis    with first child   . COMMON MIGRAINE 05/15/2009  . DIABETES MELLITUS, TYPE II 08/02/2007  . GERD 08/02/2007  . HYPERLIPIDEMIA 08/02/2007  . INSOMNIA-SLEEP DISORDER-UNSPEC 01/04/2008  . INTERMITTENT VERTIGO 05/15/2009  . LIBIDO, DECREASED 01/09/2010   Past Surgical History:  Procedure Laterality Date  . CESAREAN SECTION     x 3  . CYST EXCISION     left knee  . PAROTIDECTOMY  10/21/2011   Procedure: PAROTIDECTOMY;  Surgeon: Melida Quitter, MD;  Location: [facility] OR;  Service: ENT;  Laterality: Left;   Social History   Socioeconomic History  . Marital status: Married    Spouse name: [name]  . Number of children: 3  . Years of education: Degree  . Highest education level: Not on file  Occupational History  . Occupation: Research scientist (life sciences): [employer]  . Occupation: Chartered certified accountant    Employer: [employer]  . Financial resource strain: Not on file  . Food insecurity:    Worry: Not on file    Inability: Not on file  . Transportation needs:    Medical: Not on file    Non-medical: Not on file  Tobacco Use  . Smoking status:  Never Smoker  . Smokeless tobacco: Never Used  Substance and Sexual Activity  . Alcohol use: No    Alcohol/week: 0.0 standard drinks  . Drug use: No  . Sexual activity: Yes  Lifestyle  . Physical activity:    Days per week: Not on file    Minutes per session: Not on file  . Stress: Not on file  Relationships  . Social connections:    Talks on phone: Not on file    Gets together: Not on file    Attends religious service: Not on file    Active member of club or organization: Not on file    Attends meetings of clubs or organizations: Not on file    Relationship status: Not on file  Other Topics Concern  . Not on file  Social History Narrative   She has 3 daughters.   Patient has a 4 year degree.    Patient working at [employer].    Patient is married to [place].   Allergies  Allergen Reactions  . Lipitor [Atorvastatin]     REACTION: myalgias  . Nitroglycerin    Family History  Problem Relation Age of Onset  . Hypertension Father   . Diabetes Father   . Asthma Father   . Cervical cancer Maternal Aunt   .  Heart disease Maternal Aunt   . Anesthesia problems Neg Hx   . Colon cancer Neg Hx     Current Outpatient Medications (Endocrine & Metabolic):  .  insulin aspart (NOVOLOG FLEXPEN) 100 UNIT/ML FlexPen, Inject 6-10 Units into the skin 3 (three) times daily before meals. .  Insulin Glargine (LANTUS SOLOSTAR) 100 UNIT/ML Solostar Pen, INJECT 40 UNITS IN THE MORNING. .  insulin lispro (HUMALOG KWIKPEN) 100 UNIT/ML KiwkPen, Inject 0.15 mLs (15 Units total) into the skin 3 (three) times daily. Marland Kitchen  JANUVIA 100 MG tablet, TAKE 1 TABLET (100 MG TOTAL) BY MOUTH DAILY. .  metFORMIN (GLUCOPHAGE-XR) 500 MG 24 hr tablet, TAKE 2 TABLETS BY MOUTH 2 TIMES DAILY WITH A MEAL.  Current Outpatient Medications (Cardiovascular):  .  atenolol (TENORMIN) 25 MG tablet, Take 1 tablet (25 mg total) by mouth daily. Marland Kitchen  atenolol (TENORMIN) 25 MG tablet, Take 1 tablet (25 mg total) by mouth daily. X 7  days, then increase to 2 tabs (56m) daily .  rosuvastatin (CRESTOR) 40 MG tablet, Take 1 tablet (40 mg total) by mouth daily.  Current Outpatient Medications (Respiratory):  .  benzonatate (TESSALON) 200 MG capsule, Take 1 capsule (200 mg total) by mouth 3 (three) times daily as needed (with full glass of water). .  brompheniramine-pseudoephedrine-DM 30-2-10 MG/5ML syrup, Take 5 mLs by mouth 4 (four) times daily as needed. .  Cetirizine HCl 10 MG CAPS, Take 1 capsule (10 mg total) by mouth daily for 15 days. .  fluticasone (FLONASE) 50 MCG/ACT nasal spray, Place 1-2 sprays into both nostrils daily for 7 days.  Current Outpatient Medications (Analgesics):  .  aspirin 81 MG EC tablet, Take 81 mg by mouth daily.   .  butalbital-acetaminophen-caffeine (FIORICET, ESGIC) 50-325-40 MG tablet, TAKE 1 TABLET BY MOUTH EVERY 6 HOURS AS NEEDED FOR HEADACHE (MAX OF 1 OR 2 TABLETS PER DAY) .  Erenumab-aooe (AIMOVIG) 70 MG/ML SOAJ, Inject 70 mg into the skin every 30 (thirty) days. .  flurbiprofen (ANSAID) 100 MG tablet, Take 1 tablet (100 mg total) by mouth every 8 (eight) hours as needed. Take no more than 3013min 24 hours .  meloxicam (MOBIC) 15 MG tablet, TAKE 1 TABLET (15 MG TOTAL) BY MOUTH DAILY. .  naproxen (NAPROSYN) 500 MG tablet, TAKE 1 TABLET BY MOUTH 2 TIMES DAILY WITH A MEAL .  traMADol (ULTRAM) 50 MG tablet, TAKE 1 TABLET BY MOUTH EVERY 8 HOURS AS NEEDED .  SUMAtriptan Succinate (ZEMBRACE SYMTOUCH) 3 MG/0.5ML SOAJ, Inject 1 Device into the skin once. May repeat dose once after 1 hour if headache persists or returns (do not exceed two injections in 24 hours)   Current Outpatient Medications (Other):  . Marland KitchenACCU-CHEK FASTCLIX LANCETS MISC, USE TO CHECK BLOOD SUGARS TWICE TIMES A DAY .  ACCU-CHEK GUIDE test strip, USE TO CHECK BLOOD SUGARS TWICE A DAY DX E11.9 .  Blood Glucose Monitoring Suppl (ACCU-CHEK GUIDE) w/Device KIT, 1 Device by Does not apply route 2 (two) times daily. Use to check blood  sugars twice a day Dx. E11.9 .  DENTA 5000 PLUS 1.1 % CREA dental cream,  .  esomeprazole (NEXIUM) 40 MG capsule, TAKE 1 CAPSULE BY MOUTH 2 TIMES DAILY BEFORE A MEAL. . Marland Kitchengabapentin (NEURONTIN) 300 MG capsule, TAKE 1 CAPSULE BY MOUTH 2 TIMES DAILY. .  hydrOXYzine (ATARAX/VISTARIL) 10 MG tablet, Take 1 tablet (10 mg total) by mouth 3 (three) times daily as needed. .  Insulin Pen Needle (UNIFINE PENTIPS) 32G X  4 MM MISC, USE 4x a day .  methocarbamol (ROBAXIN) 500 MG tablet, Take 1 tablet (500 mg total) by mouth 2 (two) times daily as needed for muscle spasms. .  OnabotulinumtoxinA (BOTOX IJ), Inject 1 each as directed every 3 (three) months. .  ondansetron (ZOFRAN) 4 MG tablet, TAKE 1 TABLET BY MOUTH EVERY 8 HOURS AS NEEDED FOR NAUSEA OR VOMITING.    Past medical history, social, surgical and family history all reviewed in electronic medical record.  No pertanent information unless stated regarding to the chief complaint.   Review of Systems:  No headache, visual changes, nausea, vomiting, diarrhea, constipation, dizziness, abdominal pain, skin rash, fevers, chills, night sweats, weight loss, swollen lymph nodes,  chest pain, shortness of breath, mood changes.  Positive muscle aches, body aches  Objective  Blood pressure 106/76, pulse 99, height _0  (1.549 m), weight 145 lb (65.8 kg), SpO2 97 %.   General: No apparent distress alert and oriented x3 mood and affect normal, dressed appropriately.  HEENT: Pupils equal, extraocular movements intact  Respiratory: Patient's speak in full sentences and does not appear short of breath  Cardiovascular: No lower extremity edema, non tender, no erythema  Skin: Warm dry intact with no signs of infection or rash on extremities or on axial skeleton.  Abdomen: Soft nontender  Neuro: Cranial nerves II through XII are intact, neurovascularly intact in all extremities with 2+ DTRs and 2+ pulses.  Lymph: No lymphadenopathy of posterior or anterior cervical  chain or axillae bilaterally.  Gait normal with good balance and coordination.  MSK:  Non tender with full range of motion and good stability and symmetric strength and tone of shoulders, elbows, wrist, hip, knee and ankles bilaterally.  Neck: Inspection mild loss of lordosis. No palpable stepoffs. Negative Spurling's maneuver. Mild limited range of motion with sidebending and rotation bilaterally Grip strength and sensation normal in bilateral hands Strength good C4 to T1 distribution No sensory change to C4 to T1 Negative Hoffman sign bilaterally Reflexes normal Tightness of the trapezius bilaterally  Back Exam:  Inspection: Unremarkable  Motion: Flexion 45 deg, Extension 25 deg, Side Bending to 35 deg bilaterally,  Rotation to 45 deg bilaterally  SLR laying: Negative  XSLR laying: Negative  Palpable tenderness: More tenderness in the thoracolumbar junction. FABER: negative. Sensory change: Gross sensation intact to all lumbar and sacral dermatomes.  Reflexes: 2+ at both patellar tendons, 2+ at achilles tendons, Babinski's downgoing.  Strength at foot  Plantar-flexion: 5/5 Dorsi-flexion: 5/5 Eversion: 5/5 Inversion: 5/5  Leg strength  Quad: 5/5 Hamstring: 5/5 Hip flexor: 5/5 Hip abductors: 5/5  Gait unremarkable.  Osteopathic findings C2 flexed rotated and side bent right C7 flexed rotated and side bent left T4 extended rotated and side bent right inhaled rib L1 flexed rotated and side bent right Sacrum right on right     Impression and Recommendations:     This case required medical decision making of moderate complexity. The above documentation has been reviewed and is accurate and complete Lyndal Pulley, DO       Note: This dictation was prepared with Dragon dictation along with smaller phrase technology. Any transcriptional errors that result from this process are unintentional.

## 2018-07-26 ENCOUNTER — Other Ambulatory Visit: Payer: Self-pay | Admitting: Internal Medicine

## 2018-07-26 ENCOUNTER — Encounter: Payer: Self-pay | Admitting: Family Medicine

## 2018-07-26 ENCOUNTER — Ambulatory Visit: Payer: No Typology Code available for payment source | Admitting: Family Medicine

## 2018-07-26 VITALS — BP 102/66 | HR 93 | Ht 61.0 in | Wt 146.0 lb

## 2018-07-26 DIAGNOSIS — M999 Biomechanical lesion, unspecified: Secondary | ICD-10-CM | POA: Diagnosis not present

## 2018-07-26 DIAGNOSIS — R51 Headache: Secondary | ICD-10-CM | POA: Diagnosis not present

## 2018-07-26 DIAGNOSIS — G4486 Cervicogenic headache: Secondary | ICD-10-CM

## 2018-07-26 LAB — HM DIABETES EYE EXAM

## 2018-07-26 NOTE — Patient Instructions (Signed)
Good to see you  See me again in 3-4 weeks If noto better we will consider MRI of the back  Add calcium pyruvate 1500mg  daily

## 2018-07-26 NOTE — Assessment & Plan Note (Signed)
Decision today to treat with OMT was based on Physical Exam  After verbal consent patient was treated with HVLA, ME, FPR techniques in cervical, thoracic, rib lumbar and sacral areas  Patient tolerated the procedure well with improvement in symptoms  Patient given exercises, stretches and lifestyle modifications  See medications in patient instructions if given  Patient will follow up in 4-8 weeks 

## 2018-07-26 NOTE — Assessment & Plan Note (Signed)
Cervicogenic headaches.  Discussed icing regimen and home exercise.  Discussed posture and ergonomics.  Follow-up again in 4 to 8 weeks

## 2018-07-26 NOTE — Progress Notes (Signed)
[name] [address] [place], [place] [address] Phone: [phone] Subjective:      CC: Back and neck pain  [id number]  Rachel [name] is a 53 y.o. female coming in with complaint of back pain. Patient is here for OMT. Patient has been driving up to [place] to see her mother. Patient notes tightness in her back.      Past Medical History:  Diagnosis Date  . ALLERGIC RHINITIS 10/04/2007  . Anemia 01/21/2011  . ASTHMA 08/02/2007   INHALERS ONLY IN [place]  . Blood transfusion without reported diagnosis    with first child   . COMMON MIGRAINE 05/15/2009  . DIABETES MELLITUS, TYPE II 08/02/2007  . GERD 08/02/2007  . HYPERLIPIDEMIA 08/02/2007  . INSOMNIA-SLEEP DISORDER-UNSPEC 01/04/2008  . INTERMITTENT VERTIGO 05/15/2009  . LIBIDO, DECREASED 01/09/2010   Past Surgical History:  Procedure Laterality Date  . CESAREAN SECTION     x 3  . CYST EXCISION     left knee  . PAROTIDECTOMY  10/21/2011   Procedure: PAROTIDECTOMY;  Surgeon: Melida Quitter, MD;  Location: [facility] OR;  Service: ENT;  Laterality: Left;   Social History   Socioeconomic History  . Marital status: Married    Spouse name: [name]  . Number of children: 3  . Years of education: Degree  . Highest education level: Not on file  Occupational History  . Occupation: Research scientist (life sciences): [employer]  . Occupation: Chartered certified accountant    Employer: [employer]  . Financial resource strain: Not on file  . Food insecurity:    Worry: Not on file    Inability: Not on file  . Transportation needs:    Medical: Not on file    Non-medical: Not on file  Tobacco Use  . Smoking status: Never Smoker  . Smokeless tobacco: Never Used  Substance and Sexual Activity  . Alcohol use: No    Alcohol/week: 0.0 standard drinks  . Drug use: No  . Sexual activity: Yes  Lifestyle  . Physical activity:    Days per week: Not on file    Minutes per session: Not on file  . Stress: Not on  file  Relationships  . Social connections:    Talks on phone: Not on file    Gets together: Not on file    Attends religious service: Not on file    Active member of club or organization: Not on file    Attends meetings of clubs or organizations: Not on file    Relationship status: Not on file  Other Topics Concern  . Not on file  Social History Narrative   She has 3 daughters.   Patient has a 4 year degree.    Patient working at [employer].    Patient is married to [place].   Allergies  Allergen Reactions  . Lipitor [Atorvastatin]     REACTION: myalgias  . Nitroglycerin    Family History  Problem Relation Age of Onset  . Hypertension Father   . Diabetes Father   . Asthma Father   . Cervical cancer Maternal Aunt   . Heart disease Maternal Aunt   . Anesthesia problems Neg Hx   . Colon cancer Neg Hx     Current Outpatient Medications (Endocrine & Metabolic):  .  insulin aspart (NOVOLOG FLEXPEN) 100 UNIT/ML FlexPen, Inject 6-10 Units into the skin 3 (three) times daily before meals. .  Insulin Glargine (LANTUS  SOLOSTAR) 100 UNIT/ML Solostar Pen, INJECT 40 UNITS IN THE MORNING. .  insulin lispro (HUMALOG KWIKPEN) 100 UNIT/ML KiwkPen, Inject 0.15 mLs (15 Units total) into the skin 3 (three) times daily. Marland Kitchen  JANUVIA 100 MG tablet, TAKE 1 TABLET (100 MG TOTAL) BY MOUTH DAILY. .  metFORMIN (GLUCOPHAGE-XR) 500 MG 24 hr tablet, TAKE 2 TABLETS BY MOUTH 2 TIMES DAILY WITH A MEAL.  Current Outpatient Medications (Cardiovascular):  .  atenolol (TENORMIN) 25 MG tablet, Take 1 tablet (25 mg total) by mouth daily. Marland Kitchen  atenolol (TENORMIN) 25 MG tablet, Take 1 tablet (25 mg total) by mouth daily. X 7 days, then increase to 2 tabs (104m) daily .  rosuvastatin (CRESTOR) 40 MG tablet, Take 1 tablet (40 mg total) by mouth daily.  Current Outpatient Medications (Respiratory):  .  benzonatate (TESSALON) 200 MG capsule, Take 1 capsule (200 mg total) by mouth 3 (three) times daily as needed (with  full glass of water). .  brompheniramine-pseudoephedrine-DM 30-2-10 MG/5ML syrup, Take 5 mLs by mouth 4 (four) times daily as needed. .  Cetirizine HCl 10 MG CAPS, Take 1 capsule (10 mg total) by mouth daily for 15 days. .  fluticasone (FLONASE) 50 MCG/ACT nasal spray, Place 1-2 sprays into both nostrils daily for 7 days.  Current Outpatient Medications (Analgesics):  .  aspirin 81 MG EC tablet, Take 81 mg by mouth daily.   .  butalbital-acetaminophen-caffeine (FIORICET, ESGIC) 50-325-40 MG tablet, TAKE 1 TABLET BY MOUTH EVERY 6 HOURS AS NEEDED FOR HEADACHE (MAX OF 1 OR 2 TABLETS PER DAY) .  Erenumab-aooe (AIMOVIG) 70 MG/ML SOAJ, Inject 70 mg into the skin every 30 (thirty) days. .  flurbiprofen (ANSAID) 100 MG tablet, Take 1 tablet (100 mg total) by mouth every 8 (eight) hours as needed. Take no more than 3039min 24 hours .  meloxicam (MOBIC) 15 MG tablet, TAKE 1 TABLET (15 MG TOTAL) BY MOUTH DAILY. .  naproxen (NAPROSYN) 500 MG tablet, TAKE 1 TABLET BY MOUTH 2 TIMES DAILY WITH A MEAL .  traMADol (ULTRAM) 50 MG tablet, TAKE 1 TABLET BY MOUTH EVERY 8 HOURS AS NEEDED .  SUMAtriptan Succinate (ZEMBRACE SYMTOUCH) 3 MG/0.5ML SOAJ, Inject 1 Device into the skin once. May repeat dose once after 1 hour if headache persists or returns (do not exceed two injections in 24 hours)   Current Outpatient Medications (Other):  . Marland KitchenACCU-CHEK FASTCLIX LANCETS MISC, USE TO CHECK BLOOD SUGARS TWICE TIMES A DAY .  ACCU-CHEK GUIDE test strip, USE TO CHECK BLOOD SUGARS TWICE A DAY DX E11.9 .  Blood Glucose Monitoring Suppl (ACCU-CHEK GUIDE) w/Device KIT, 1 Device by Does not apply route 2 (two) times daily. Use to check blood sugars twice a day Dx. E11.9 .  DENTA 5000 PLUS 1.1 % CREA dental cream,  .  esomeprazole (NEXIUM) 40 MG capsule, TAKE 1 CAPSULE BY MOUTH 2 TIMES DAILY BEFORE A MEAL. . Marland Kitchengabapentin (NEURONTIN) 300 MG capsule, TAKE 1 CAPSULE BY MOUTH 2 TIMES DAILY. .  hydrOXYzine (ATARAX/VISTARIL) 10 MG tablet,  Take 1 tablet (10 mg total) by mouth 3 (three) times daily as needed. .  Insulin Pen Needle (UNIFINE PENTIPS) 32G X 4 MM MISC, USE 4x a day .  methocarbamol (ROBAXIN) 500 MG tablet, Take 1 tablet (500 mg total) by mouth 2 (two) times daily as needed for muscle spasms. .  OnabotulinumtoxinA (BOTOX IJ), Inject 1 each as directed every 3 (three) months. .  ondansetron (ZOFRAN) 4 MG tablet, TAKE 1 TABLET  BY MOUTH EVERY 8 HOURS AS NEEDED FOR NAUSEA OR VOMITING.    Past medical history, social, surgical and family history all reviewed in electronic medical record.  No pertanent information unless stated regarding to the chief complaint.   Review of Systems:  No headache, visual changes, nausea, vomiting, diarrhea, constipation, dizziness, abdominal pain, skin rash, fevers, chills, night sweats, weight loss, swollen lymph nodes, body aches, joint swelling, chest pain, shortness of breath, mood changes.  Positive muscle aches  Objective  Blood pressure 102/66, pulse 93, height _0  (1.549 m), weight 146 lb (66.2 kg), SpO2 99 %.   General: No apparent distress alert and oriented x3 mood and affect normal, dressed appropriately.  HEENT: Pupils equal, extraocular movements intact  Respiratory: Patient's speak in full sentences and does not appear short of breath  Cardiovascular: No lower extremity edema, non tender, no erythema  Skin: Warm dry intact with no signs of infection or rash on extremities or on axial skeleton.  Abdomen: Soft nontender  Neuro: Cranial nerves II through XII are intact, neurovascularly intact in all extremities with 2+ DTRs and 2+ pulses.  Lymph: No lymphadenopathy of posterior or anterior cervical chain or axillae bilaterally.  Gait mild antalgic MSK:  tender with full range of motion and good stability and symmetric strength and tone of shoulders, elbows, wrist, hip, knee and ankles bilaterally.   Neck: Inspection loss of lordosis more tenderness at the occipital  area.. No palpable stepoffs. Negative Spurling's maneuver. Full neck range of motion Grip strength and sensation normal in bilateral hands Strength good C4 to T1 distribution No sensory change to C4 to T1 Negative Hoffman sign bilaterally Reflexes normal  Back exam has more pain in the paraspinal musculature mostly around the thoracolumbar juncture as well as the right sacroiliac joint.  Positive Faber on the right side.  Patient's pain seems to be somewhat out of proportion to the amount of palpation.  Osteopathic findings  C2 flexed rotated and side bent right C4 flexed rotated and side bent left C7 flexed rotated and side bent left T3 extended rotated and side bent right inhaled third rib T9 extended rotated and side bent left L2 flexed rotated and side bent right Sacrum right on right    Impression and Recommendations:     This case required medical decision making of moderate complexity. The above documentation has been reviewed and is accurate and complete Lyndal Pulley, DO       Note: This dictation was prepared with Dragon dictation along with smaller phrase technology. Any transcriptional errors that result from this process are unintentional.

## 2018-07-26 NOTE — Telephone Encounter (Signed)
Done erx 

## 2018-08-04 ENCOUNTER — Telehealth: Payer: Self-pay

## 2018-08-04 NOTE — Telephone Encounter (Signed)
Rcvd call from [place] at [facility] concerning Rachel [name]. She wanted to know if Pt had a reduction in headaches with Aimovig. I advised yes.

## 2018-08-16 ENCOUNTER — Encounter: Payer: Self-pay | Admitting: Family Medicine

## 2018-08-16 ENCOUNTER — Encounter: Payer: 59 | Admitting: Internal Medicine

## 2018-08-16 ENCOUNTER — Ambulatory Visit: Payer: No Typology Code available for payment source | Admitting: Family Medicine

## 2018-08-16 VITALS — BP 110/78 | HR 82 | Ht 61.0 in | Wt 146.0 lb

## 2018-08-16 DIAGNOSIS — M545 Low back pain, unspecified: Secondary | ICD-10-CM

## 2018-08-16 DIAGNOSIS — G8929 Other chronic pain: Secondary | ICD-10-CM

## 2018-08-16 DIAGNOSIS — M999 Biomechanical lesion, unspecified: Secondary | ICD-10-CM

## 2018-08-16 NOTE — Assessment & Plan Note (Signed)
Low back pain overall.  Discussed posture and ergonomics.  Discussed core strengthening and.  I do believe that stress is playing a role as well.  Hopefully this will be more beneficial.  Discussed which activities to do which was to avoid.  Patient will increase activity slowly over the course the next several days.  Follow-up with me again in 4 to 8 weeks

## 2018-08-16 NOTE — Patient Instructions (Signed)
Good to see you  I think the back is relatively fine.  I hope you can find a little more time for yourself.  See me again in 5-6 weeks

## 2018-08-16 NOTE — Progress Notes (Signed)
[name] [address], [place] [address] Phone: [phone] Subjective:     CC: Low back pain  [id number]  Rachel [name] is a 53 y.o. female coming in with complaint of low back pain. Is having pain in the lower thoracic spine as well. Woke up in pain today.  Patient has had more discomfort recently.  H no radiation down the legs though.  No numbness or tingling.       Past Medical History:  Diagnosis Date  . ALLERGIC RHINITIS 10/04/2007  . Anemia 01/21/2011  . ASTHMA 08/02/2007   INHALERS ONLY IN [place]  . Blood transfusion without reported diagnosis    with first child   . COMMON MIGRAINE 05/15/2009  . DIABETES MELLITUS, TYPE II 08/02/2007  . GERD 08/02/2007  . HYPERLIPIDEMIA 08/02/2007  . INSOMNIA-SLEEP DISORDER-UNSPEC 01/04/2008  . INTERMITTENT VERTIGO 05/15/2009  . LIBIDO, DECREASED 01/09/2010   Past Surgical History:  Procedure Laterality Date  . CESAREAN SECTION     x 3  . CYST EXCISION     left knee  . PAROTIDECTOMY  10/21/2011   Procedure: PAROTIDECTOMY;  Surgeon: Melida Quitter, MD;  Location: [facility] OR;  Service: ENT;  Laterality: Left;   Social History   Socioeconomic History  . Marital status: Married    Spouse name: [name]  . Number of children: 3  . Years of education: Degree  . Highest education level: Not on file  Occupational History  . Occupation: Research scientist (life sciences): [employer]  . Occupation: Chartered certified accountant    Employer: [employer]  . Financial resource strain: Not on file  . Food insecurity:    Worry: Not on file    Inability: Not on file  . Transportation needs:    Medical: Not on file    Non-medical: Not on file  Tobacco Use  . Smoking status: Never Smoker  . Smokeless tobacco: Never Used  Substance and Sexual Activity  . Alcohol use: No    Alcohol/week: 0.0 standard drinks  . Drug use: No  . Sexual activity: Yes  Lifestyle  . Physical activity:    Days per week: Not on file      Minutes per session: Not on file  . Stress: Not on file  Relationships  . Social connections:    Talks on phone: Not on file    Gets together: Not on file    Attends religious service: Not on file    Active member of club or organization: Not on file    Attends meetings of clubs or organizations: Not on file    Relationship status: Not on file  Other Topics Concern  . Not on file  Social History Narrative   She has 3 daughters.   Patient has a 4 year degree.    Patient working at [employer].    Patient is married to [place].   Allergies  Allergen Reactions  . Lipitor [Atorvastatin]     REACTION: myalgias  . Nitroglycerin    Family History  Problem Relation Age of Onset  . Hypertension Father   . Diabetes Father   . Asthma Father   . Cervical cancer Maternal Aunt   . Heart disease Maternal Aunt   . Anesthesia problems Neg Hx   . Colon cancer Neg Hx     Current Outpatient Medications (Endocrine & Metabolic):  .  insulin aspart (NOVOLOG FLEXPEN) 100 UNIT/ML FlexPen, Inject 6-10 Units  into the skin 3 (three) times daily before meals. .  Insulin Glargine (LANTUS SOLOSTAR) 100 UNIT/ML Solostar Pen, INJECT 40 UNITS IN THE MORNING. .  insulin lispro (HUMALOG KWIKPEN) 100 UNIT/ML KiwkPen, Inject 0.15 mLs (15 Units total) into the skin 3 (three) times daily. Marland Kitchen  JANUVIA 100 MG tablet, TAKE 1 TABLET (100 MG TOTAL) BY MOUTH DAILY. .  metFORMIN (GLUCOPHAGE-XR) 500 MG 24 hr tablet, TAKE 2 TABLETS BY MOUTH 2 TIMES DAILY WITH A MEAL.  Current Outpatient Medications (Cardiovascular):  .  atenolol (TENORMIN) 25 MG tablet, Take 1 tablet (25 mg total) by mouth daily. Marland Kitchen  atenolol (TENORMIN) 25 MG tablet, Take 1 tablet (25 mg total) by mouth daily. X 7 days, then increase to 2 tabs (60m) daily .  rosuvastatin (CRESTOR) 40 MG tablet, Take 1 tablet (40 mg total) by mouth daily.  Current Outpatient Medications (Respiratory):  .  benzonatate (TESSALON) 200 MG capsule, Take 1 capsule (200 mg  total) by mouth 3 (three) times daily as needed (with full glass of water). .  brompheniramine-pseudoephedrine-DM 30-2-10 MG/5ML syrup, Take 5 mLs by mouth 4 (four) times daily as needed. .  Cetirizine HCl 10 MG CAPS, Take 1 capsule (10 mg total) by mouth daily for 15 days. .  fluticasone (FLONASE) 50 MCG/ACT nasal spray, Place 1-2 sprays into both nostrils daily for 7 days.  Current Outpatient Medications (Analgesics):  .  aspirin 81 MG EC tablet, Take 81 mg by mouth daily.   .  butalbital-acetaminophen-caffeine (FIORICET, ESGIC) 50-325-40 MG tablet, TAKE 1 TABLET BY MOUTH EVERY 6 HOURS AS NEEDED FOR HEADACHE (MAX OF 1 OR 2 TABLETS PER DAY) .  Erenumab-aooe (AIMOVIG) 70 MG/ML SOAJ, Inject 70 mg into the skin every 30 (thirty) days. .  flurbiprofen (ANSAID) 100 MG tablet, Take 1 tablet (100 mg total) by mouth every 8 (eight) hours as needed. Take no more than 3076min 24 hours .  meloxicam (MOBIC) 15 MG tablet, TAKE 1 TABLET (15 MG TOTAL) BY MOUTH DAILY. .  naproxen (NAPROSYN) 500 MG tablet, TAKE 1 TABLET BY MOUTH 2 TIMES DAILY WITH A MEAL .  traMADol (ULTRAM) 50 MG tablet, TAKE 1 TABLET BY MOUTH EVERY 8 HOURS AS NEEDED .  SUMAtriptan Succinate (ZEMBRACE SYMTOUCH) 3 MG/0.5ML SOAJ, Inject 1 Device into the skin once. May repeat dose once after 1 hour if headache persists or returns (do not exceed two injections in 24 hours)   Current Outpatient Medications (Other):  . Marland KitchenACCU-CHEK FASTCLIX LANCETS MISC, USE TO CHECK BLOOD SUGARS TWICE TIMES A DAY .  ACCU-CHEK GUIDE test strip, USE TO CHECK BLOOD SUGARS TWICE A DAY DX E11.9 .  Blood Glucose Monitoring Suppl (ACCU-CHEK GUIDE) w/Device KIT, 1 Device by Does not apply route 2 (two) times daily. Use to check blood sugars twice a day Dx. E11.9 .  DENTA 5000 PLUS 1.1 % CREA dental cream,  .  esomeprazole (NEXIUM) 40 MG capsule, TAKE 1 CAPSULE BY MOUTH 2 TIMES DAILY BEFORE A MEAL. . Marland Kitchengabapentin (NEURONTIN) 300 MG capsule, TAKE 1 CAPSULE BY MOUTH 2 TIMES  DAILY. .  hydrOXYzine (ATARAX/VISTARIL) 10 MG tablet, Take 1 tablet (10 mg total) by mouth 3 (three) times daily as needed. .  Insulin Pen Needle (UNIFINE PENTIPS) 32G X 4 MM MISC, USE 4x a day .  methocarbamol (ROBAXIN) 500 MG tablet, Take 1 tablet (500 mg total) by mouth 2 (two) times daily as needed for muscle spasms. .  OnabotulinumtoxinA (BOTOX IJ), Inject 1 each as directed  every 3 (three) months. .  ondansetron (ZOFRAN) 4 MG tablet, TAKE 1 TABLET BY MOUTH EVERY 8 HOURS AS NEEDED FOR NAUSEA OR VOMITING.    Past medical history, social, surgical and family history all reviewed in electronic medical record.  No pertanent information unless stated regarding to the chief complaint.   Review of Systems:  No headache, visual changes, nausea, vomiting, diarrhea, constipation, dizziness, abdominal pain, skin rash, fevers, chills, night sweats, weight loss, swollen lymph nodes, body aches, joint swelling, , chest pain, shortness of breath, mood changes.  Positive muscle aches  Objective  Blood pressure 110/78, pulse 82, height _0  (1.549 m), weight 146 lb (66.2 kg), SpO2 98 %.    General: No apparent distress alert and oriented x3 mood and affect normal, dressed appropriately.  HEENT: Pupils equal, extraocular movements intact  Respiratory: Patient's speak in full sentences and does not appear short of breath  Cardiovascular: No lower extremity edema, non tender, no erythema  Skin: Warm dry intact with no signs of infection or rash on extremities or on axial skeleton.  Abdomen: Soft nontender  Neuro: Cranial nerves II through XII are intact, neurovascularly intact in all extremities with 2+ DTRs and 2+ pulses.  Lymph: No lymphadenopathy of posterior or anterior cervical chain or axillae bilaterally.  Gait normal with good balance and coordination.  MSK:  Non tender with full range of motion and good stability and symmetric strength and tone of shoulders, elbows, wrist, hip, knee and ankles  bilaterally. Low back exam has some loss of lordosis.  Tightness in the thoracolumbar as well as lumbosacral area bilaterally.  Tenderness over the sacroiliac joints bilaterally.  No radiation down the legs but some tightness in the hamstrings.  Tightness and Corky Sox test as well.  Some mild limited range of motion in all planes as well.   Osteopathic findings  C6 flexed rotated and side bent left T5 extended rotated and side bent right inhaled rib T9 extended rotated and side bent left L2 flexed rotated and side bent right Sacrum right on right    Impression and Recommendations:     This case required medical decision making of moderate complexity. The above documentation has been reviewed and is accurate and complete Lyndal Pulley, DO       Note: This dictation was prepared with Dragon dictation along with smaller phrase technology. Any transcriptional errors that result from this process are unintentional.

## 2018-08-16 NOTE — Assessment & Plan Note (Signed)
Decision today to treat with OMT was based on Physical Exam  After verbal consent patient was treated with HVLA, ME, FPR techniques in cervical, thoracic, rib, lumbar and sacral areas  Patient tolerated the procedure well with improvement in symptoms  Patient given exercises, stretches and lifestyle modifications  See medications in patient instructions if given  Patient will follow up in 5-6 weeks

## 2018-08-18 ENCOUNTER — Telehealth: Payer: Self-pay | Admitting: Neurology

## 2018-08-18 ENCOUNTER — Other Ambulatory Visit: Payer: Self-pay | Admitting: Internal Medicine

## 2018-08-18 DIAGNOSIS — Z1231 Encounter for screening mammogram for malignant neoplasm of breast: Secondary | ICD-10-CM

## 2018-08-18 MED ORDER — PREDNISONE 10 MG PO TABS: ORAL_TABLET | ORAL | 0 refills | Status: AC

## 2018-08-18 NOTE — Telephone Encounter (Signed)
Recommend prednisone taper to break this intractable headache.  Numbness likely related to migraine:  Take 6tabs x1day, then 5tabs x1day, then 4tabs x1day, then 3tabs x1day, then 2tabs x1day, then 1tab x1day, then STOP

## 2018-08-18 NOTE — Telephone Encounter (Signed)
Patient called and states that her head is numb and has a migraine bin the back of head please call her   You can call her at work at [phone] or  [phone]

## 2018-08-18 NOTE — Telephone Encounter (Signed)
Spoke with patient. She states head numbness started a week ago. It is constant. Also having pain in back of head to her shoulders. She is currently on Aimovig once a month (last dose 07/30/2018). For breakthrough migraines she has taken Fiorcet twice yesterday and Sprix once yesterday evening. She states nothing is helping.  Please advise.

## 2018-08-18 NOTE — Telephone Encounter (Signed)
Spoke with patient. She is in agreement to try prednisone taper. She will keep an eye on her blood sugar levels.

## 2018-08-23 ENCOUNTER — Encounter: Payer: Self-pay | Admitting: Internal Medicine

## 2018-08-23 ENCOUNTER — Other Ambulatory Visit (INDEPENDENT_AMBULATORY_CARE_PROVIDER_SITE_OTHER): Payer: No Typology Code available for payment source

## 2018-08-23 ENCOUNTER — Other Ambulatory Visit: Payer: Self-pay

## 2018-08-23 ENCOUNTER — Ambulatory Visit (INDEPENDENT_AMBULATORY_CARE_PROVIDER_SITE_OTHER): Payer: No Typology Code available for payment source | Admitting: Internal Medicine

## 2018-08-23 VITALS — BP 120/78 | HR 96 | Ht 61.0 in | Wt 147.0 lb

## 2018-08-23 DIAGNOSIS — E1165 Type 2 diabetes mellitus with hyperglycemia: Secondary | ICD-10-CM

## 2018-08-23 DIAGNOSIS — Z0000 Encounter for general adult medical examination without abnormal findings: Secondary | ICD-10-CM | POA: Diagnosis not present

## 2018-08-23 DIAGNOSIS — Z794 Long term (current) use of insulin: Secondary | ICD-10-CM

## 2018-08-23 DIAGNOSIS — Z1159 Encounter for screening for other viral diseases: Secondary | ICD-10-CM | POA: Diagnosis not present

## 2018-08-23 LAB — CBC WITH DIFFERENTIAL/PLATELET
Basophils Absolute: 0.1 10*3/uL (ref 0.0–0.1)
Basophils Relative: 0.5 % (ref 0.0–3.0)
Eosinophils Absolute: 0 10*3/uL (ref 0.0–0.7)
Eosinophils Relative: 0.1 % (ref 0.0–5.0)
HCT: 37.2 % (ref 36.0–46.0)
Hemoglobin: 12.4 g/dL (ref 12.0–15.0)
Lymphocytes Relative: 19.9 % (ref 12.0–46.0)
Lymphs Abs: 2.1 10*3/uL (ref 0.7–4.0)
MCHC: 33.2 g/dL (ref 30.0–36.0)
MCV: 81.3 fl (ref 78.0–100.0)
Monocytes Absolute: 0.2 10*3/uL (ref 0.1–1.0)
Monocytes Relative: 2.2 % — ABNORMAL LOW (ref 3.0–12.0)
Neutro Abs: 8.3 10*3/uL — ABNORMAL HIGH (ref 1.4–7.7)
Neutrophils Relative %: 77.3 % — ABNORMAL HIGH (ref 43.0–77.0)
Platelets: 399 10*3/uL (ref 150.0–400.0)
RBC: 4.57 Mil/uL (ref 3.87–5.11)
RDW: 12.9 % (ref 11.5–15.5)
WBC: 10.7 10*3/uL — ABNORMAL HIGH (ref 4.0–10.5)

## 2018-08-23 LAB — HEPATIC FUNCTION PANEL
ALT: 12 U/L (ref 0–35)
AST: 10 U/L (ref 0–37)
Albumin: 4.3 g/dL (ref 3.5–5.2)
Alkaline Phosphatase: 79 U/L (ref 39–117)
Bilirubin, Direct: 0 mg/dL (ref 0.0–0.3)
Total Bilirubin: 0.4 mg/dL (ref 0.2–1.2)
Total Protein: 7.4 g/dL (ref 6.0–8.3)

## 2018-08-23 LAB — LDL CHOLESTEROL, DIRECT: Direct LDL: 191 mg/dL

## 2018-08-23 LAB — LIPID PANEL
Cholesterol: 274 mg/dL — ABNORMAL HIGH (ref 0–200)
HDL: 55.2 mg/dL (ref >39.00)
NonHDL: 219.03
Total CHOL/HDL Ratio: 5
Triglycerides: 322 mg/dL — ABNORMAL HIGH (ref 0.0–149.0)
VLDL: 64.4 mg/dL — ABNORMAL HIGH (ref 0.0–40.0)

## 2018-08-23 LAB — BASIC METABOLIC PANEL
BUN: 21 mg/dL (ref 6–23)
CO2: 25 mEq/L (ref 19–32)
Calcium: 9.3 mg/dL (ref 8.4–10.5)
Chloride: 100 mEq/L (ref 96–112)
Creatinine, Ser: 0.72 mg/dL (ref 0.40–1.20)
GFR: 84.33 mL/min (ref >60.00)
Glucose, Bld: 311 mg/dL — ABNORMAL HIGH (ref 70–99)
Potassium: 3.9 mEq/L (ref 3.5–5.1)
Sodium: 134 mEq/L — ABNORMAL LOW (ref 135–145)

## 2018-08-23 LAB — TSH: TSH: 0.54 u[IU]/mL (ref 0.35–4.50)

## 2018-08-23 LAB — HEMOGLOBIN A1C: Hgb A1c MFr Bld: 11.2 % — ABNORMAL HIGH (ref 4.6–6.5)

## 2018-08-23 MED ORDER — BUTALBITAL-APAP-CAFFEINE 50-325-40 MG PO TABS: ORAL_TABLET | ORAL | 5 refills | Status: DC

## 2018-08-23 NOTE — Assessment & Plan Note (Signed)
For a1c with labs

## 2018-08-23 NOTE — Assessment & Plan Note (Signed)

## 2018-08-23 NOTE — Progress Notes (Signed)
Subjective:    Patient ID: Rachel [name], female    DOB: [birth year], 53 y.o.   MRN: [id number]  HPI  Here for wellness and f/u;  Overall doing ok;  Pt denies Chest pain, worsening SOB, DOE, wheezing, orthopnea, PND, worsening LE edema, palpitations, dizziness or syncope.  Pt denies neurological change such as new headache, facial or extremity weakness.  Pt denies polydipsia, polyuria, or low sugar symptoms. Pt states overall good compliance with treatment and medications, good tolerability, and has been trying to follow appropriate diet.  Pt denies worsening depressive symptoms, suicidal ideation or panic. No fever, night sweats, wt loss, loss of appetite, or other constitutional symptoms.  Pt states good ability with ADL's, has low fall risk, home safety reviewed and adequate, no other significant changes in hearing or vision, and only occasionally active with exercise. No new complaints Past Medical History:  Diagnosis Date  . ALLERGIC RHINITIS 10/04/2007  . Anemia 01/21/2011  . ASTHMA 08/02/2007   INHALERS ONLY IN [place]  . Blood transfusion without reported diagnosis    with first child   . COMMON MIGRAINE 05/15/2009  . DIABETES MELLITUS, TYPE II 08/02/2007  . GERD 08/02/2007  . HYPERLIPIDEMIA 08/02/2007  . INSOMNIA-SLEEP DISORDER-UNSPEC 01/04/2008  . INTERMITTENT VERTIGO 05/15/2009  . LIBIDO, DECREASED 01/09/2010   Past Surgical History:  Procedure Laterality Date  . CESAREAN SECTION     x 3  . CYST EXCISION     left knee  . PAROTIDECTOMY  10/21/2011   Procedure: PAROTIDECTOMY;  Surgeon: Melida Quitter, MD;  Location: [facility];  Service: ENT;  Laterality: Left;    reports that she has never smoked. She has never used smokeless tobacco. She reports that she does not drink alcohol or use drugs. family history includes Asthma in her father; Cervical cancer in her maternal aunt; Diabetes in her father; Heart disease in her maternal aunt; Hypertension in her father. Allergies  Allergen Reactions  .  Lipitor [Atorvastatin]     REACTION: myalgias  . Nitroglycerin    Current Outpatient Medications on File Prior to Visit  Medication Sig Dispense Refill  . ACCU-CHEK FASTCLIX LANCETS MISC USE TO CHECK BLOOD SUGARS TWICE TIMES A DAY 102 each 3  . ACCU-CHEK GUIDE test strip USE TO CHECK BLOOD SUGARS TWICE A DAY DX E11.9 100 each 3  . aspirin 81 MG EC tablet Take 81 mg by mouth daily.      Marland Kitchen atenolol (TENORMIN) 25 MG tablet Take 1 tablet (25 mg total) by mouth daily. 30 tablet 2  . atenolol (TENORMIN) 25 MG tablet Take 1 tablet (25 mg total) by mouth daily. X 7 days, then increase to 2 tabs (12m) daily 60 tablet 3  . benzonatate (TESSALON) 200 MG capsule Take 1 capsule (200 mg total) by mouth 3 (three) times daily as needed (with full glass of water). 20 capsule 0  . Blood Glucose Monitoring Suppl (ACCU-CHEK GUIDE) w/Device KIT 1 Device by Does not apply route 2 (two) times daily. Use to check blood sugars twice a day Dx. E11.9 1 kit 0  . brompheniramine-pseudoephedrine-DM 30-2-10 MG/5ML syrup Take 5 mLs by mouth 4 (four) times daily as needed. 120 mL 0  . DENTA 5000 PLUS 1.1 % CREA dental cream   3  . Erenumab-aooe (AIMOVIG) 70 MG/ML SOAJ Inject 70 mg into the skin every 30 (thirty) days. 1 pen 0  . esomeprazole (NEXIUM) 40 MG capsule TAKE 1 CAPSULE BY MOUTH 2 TIMES DAILY BEFORE A MEAL. 180 capsule  1  . flurbiprofen (ANSAID) 100 MG tablet Take 1 tablet (100 mg total) by mouth every 8 (eight) hours as needed. Take no more than 331m in 24 hours 30 tablet 3  . gabapentin (NEURONTIN) 300 MG capsule TAKE 1 CAPSULE BY MOUTH 2 TIMES DAILY. 60 capsule 3  . hydrOXYzine (ATARAX/VISTARIL) 10 MG tablet Take 1 tablet (10 mg total) by mouth 3 (three) times daily as needed. 30 tablet 0  . insulin aspart (NOVOLOG FLEXPEN) 100 UNIT/ML FlexPen Inject 6-10 Units into the skin 3 (three) times daily before meals. 15 mL 11  . Insulin Glargine (LANTUS SOLOSTAR) 100 UNIT/ML Solostar Pen INJECT 40 UNITS IN THE MORNING.  33 mL 1  . insulin lispro (HUMALOG KWIKPEN) 100 UNIT/ML KiwkPen Inject 0.15 mLs (15 Units total) into the skin 3 (three) times daily. 30 mL 5  . Insulin Pen Needle (UNIFINE PENTIPS) 32G X 4 MM MISC USE 4x a day 200 each PRN  . JANUVIA 100 MG tablet TAKE 1 TABLET (100 MG TOTAL) BY MOUTH DAILY. 90 tablet 1  . meloxicam (MOBIC) 15 MG tablet TAKE 1 TABLET (15 MG TOTAL) BY MOUTH DAILY. 90 tablet 0  . metFORMIN (GLUCOPHAGE-XR) 500 MG 24 hr tablet TAKE 2 TABLETS BY MOUTH 2 TIMES DAILY WITH A MEAL. 120 tablet 5  . methocarbamol (ROBAXIN) 500 MG tablet Take 1 tablet (500 mg total) by mouth 2 (two) times daily as needed for muscle spasms. 30 tablet 2  . naproxen (NAPROSYN) 500 MG tablet TAKE 1 TABLET BY MOUTH 2 TIMES DAILY WITH A MEAL 60 tablet 0  . OnabotulinumtoxinA (BOTOX IJ) Inject 1 each as directed every 3 (three) months.    . ondansetron (ZOFRAN) 4 MG tablet TAKE 1 TABLET BY MOUTH EVERY 8 HOURS AS NEEDED FOR NAUSEA OR VOMITING. 20 tablet 1  . predniSONE (DELTASONE) 10 MG tablet Take 6 tablets (60 mg total) by mouth daily for 1 day, THEN 5 tablets (50 mg total) daily for 1 day, THEN 4 tablets (40 mg total) daily for 1 day, THEN 3 tablets (30 mg total) daily for 1 day, THEN 2 tablets (20 mg total) daily for 1 day, THEN 1 tablet (10 mg total) daily for 1 day. 21 tablet 0  . rosuvastatin (CRESTOR) 40 MG tablet Take 1 tablet (40 mg total) by mouth daily. 90 tablet 3  . traMADol (ULTRAM) 50 MG tablet TAKE 1 TABLET BY MOUTH EVERY 8 HOURS AS NEEDED 60 tablet 2  . Cetirizine HCl 10 MG CAPS Take 1 capsule (10 mg total) by mouth daily for 15 days. 15 capsule 0  . fluticasone (FLONASE) 50 MCG/ACT nasal spray Place 1-2 sprays into both nostrils daily for 7 days. 1 g 0  . SUMAtriptan Succinate (ZEMBRACE SYMTOUCH) 3 MG/0.5ML SOAJ Inject 1 Device into the skin once. May repeat dose once after 1 hour if headache persists or returns (do not exceed two injections in 24 hours) 9 pen 0   No current facility-administered  medications on file prior to visit.    Review of Systems  Constitutional: Negative for other unusual diaphoresis or sweats HENT: Negative for ear discharge or swelling Eyes: Negative for other worsening visual disturbances Respiratory: Negative for stridor or other swelling  Gastrointestinal: Negative for worsening distension or other blood Genitourinary: Negative for retention or other urinary change Musculoskeletal: Negative for other MSK pain or swelling Skin: Negative for color change or other new lesions Neurological: Negative for worsening tremors and other numbness  Psychiatric/Behavioral: Negative for worsening agitation  or other fatigue All other system neg per pt    Objective:   Physical Exam BP 120/78   Pulse 96   Ht _0  (1.549 m)   Wt 147 lb (66.7 kg)   SpO2 94%   BMI 27.78 kg/m  VS noted,  Constitutional: Pt appears in NAD HENT: Head: NCAT.  Right Ear: External ear normal.  Left Ear: External ear normal.  Eyes: . Pupils are equal, round, and reactive to light. Conjunctivae and EOM are normal Nose: without d/c or deformity Neck: Neck supple. Gross normal ROM Cardiovascular: Normal rate and regular rhythm.   Pulmonary/Chest: Effort normal and breath sounds without rales or wheezing.  Abd:  Soft, NT, ND, + BS, no organomegaly Neurological: Pt is alert. At baseline orientation, motor grossly intact Skin: Skin is warm. No rashes, other new lesions, no LE edema Psychiatric: Pt behavior is normal without agitation  No other exam findings  Lab Results  Component Value Date   WBC 7.4 11/25/2017   HGB 11.6 (L) 11/25/2017   HCT 37.1 11/25/2017   PLT 374 11/25/2017   GLUCOSE 243 (H) 11/25/2017   CHOL 285 (H) 11/23/2017   TRIG 194.0 (H) 11/23/2017   HDL 52.20 11/23/2017   LDLDIRECT 122.0 06/25/2016   LDLCALC 194 (H) 11/23/2017   ALT 14 06/25/2016   AST 13 06/25/2016   NA 136 11/25/2017   K 3.9 11/25/2017   CL 103 11/25/2017   CREATININE 0.62 11/25/2017    BUN 11 11/25/2017   CO2 26 11/25/2017   TSH 1.12 11/23/2017   INR 1.08 07/23/2009   HGBA1C 8.6 (A) 04/26/2018   HGBA1C 0 04/26/2018   HGBA1C 0 (A) 04/26/2018   HGBA1C 0.0 04/26/2018   MICROALBUR <0.7 06/25/2016       Assessment & Plan:

## 2018-08-23 NOTE — Patient Instructions (Signed)

## 2018-08-24 LAB — HEPATITIS C ANTIBODY
Hepatitis C Ab: NONREACTIVE
SIGNAL TO CUT-OFF: 0.01 (ref <1.00)

## 2018-08-25 ENCOUNTER — Encounter: Payer: Self-pay | Admitting: Family

## 2018-08-25 ENCOUNTER — Ambulatory Visit (INDEPENDENT_AMBULATORY_CARE_PROVIDER_SITE_OTHER): Payer: No Typology Code available for payment source | Admitting: Family

## 2018-08-25 ENCOUNTER — Ambulatory Visit (INDEPENDENT_AMBULATORY_CARE_PROVIDER_SITE_OTHER)
Admission: RE | Admit: 2018-08-25 | Discharge: 2018-08-25 | Disposition: A | Discharge status: Home / Self Care | Payer: No Typology Code available for payment source | Source: Ambulatory Visit | Attending: Family | Admitting: Family

## 2018-08-25 ENCOUNTER — Other Ambulatory Visit: Payer: Self-pay

## 2018-08-25 VITALS — BP 112/74 | HR 91 | Temp 98.5°F | Ht 61.0 in | Wt 144.0 lb

## 2018-08-25 DIAGNOSIS — T6591XD Toxic effect of unspecified substance, accidental (unintentional), subsequent encounter: Secondary | ICD-10-CM | POA: Diagnosis not present

## 2018-08-25 DIAGNOSIS — R0789 Other chest pain: Secondary | ICD-10-CM | POA: Diagnosis not present

## 2018-08-25 MED ORDER — PREDNISONE 20 MG PO TABS: 20.0000 mg | ORAL_TABLET | Freq: Every day | ORAL | 0 refills | Status: DC

## 2018-08-25 NOTE — Progress Notes (Signed)
Kerah Hardebeck is a 53 y.o. female with the following history as recorded in EpicCare:  Patient Active Problem List   Diagnosis Date Noted  . Nonallopathic lesion of rib cage 12/21/2017  . Orthostasis 05/11/2017  . Cervicogenic headache 04/27/2017  . Nonallopathic lesion of cervical region 04/27/2017  . Rheumatoid factor positive 02/10/2016  . Myalgia and myositis 01/08/2016  . Low back pain 12/23/2015  . Left shoulder pain 12/23/2015  . Nonallopathic lesion of lumbosacral region 07/29/2015  . Nonallopathic lesion of sacral region 07/29/2015  . Nonallopathic lesion of thoracic region 07/29/2015  . Loss of weight 06/12/2014  . Dysphagia, pharyngoesophageal phase 06/12/2014  . Headache(784.0) 12/18/2012  . Recurrent falls 06/16/2012  . Chest pain 06/16/2012  . Back pain 03/06/2012  . Syncope and collapse 06/18/2011  . Fatigue 01/21/2011  . Microcytic anemia 01/21/2011  . Cervical radiculopathy 01/21/2011  . Preventative health care 10/11/2010  . Depression 01/09/2010  . LIBIDO, DECREASED 01/09/2010  . Migraine without aura 05/15/2009  . INTERMITTENT VERTIGO 05/15/2009  . INSOMNIA-SLEEP DISORDER-UNSPEC 01/04/2008  . Allergic rhinitis 10/04/2007  . Type 2 diabetes mellitus with hyperglycemia, with long-term current use of insulin ([place]) 08/02/2007  . Hyperlipidemia 08/02/2007  . Asthma 08/02/2007  . GERD 08/02/2007    Current Outpatient Medications  Medication Sig Dispense Refill  . ACCU-CHEK FASTCLIX LANCETS MISC USE TO CHECK BLOOD SUGARS TWICE TIMES A DAY 102 each 3  . ACCU-CHEK GUIDE test strip USE TO CHECK BLOOD SUGARS TWICE A DAY DX E11.9 100 each 3  . aspirin 81 MG EC tablet Take 81 mg by mouth daily.      Marland Kitchen atenolol (TENORMIN) 25 MG tablet Take 1 tablet (25 mg total) by mouth daily. 30 tablet 2  . atenolol (TENORMIN) 25 MG tablet Take 1 tablet (25 mg total) by mouth daily. X 7 days, then increase to 2 tabs (15m) daily 60 tablet 3  . benzonatate (TESSALON) 200 MG capsule  Take 1 capsule (200 mg total) by mouth 3 (three) times daily as needed (with full glass of water). 20 capsule 0  . Blood Glucose Monitoring Suppl (ACCU-CHEK GUIDE) w/Device KIT 1 Device by Does not apply route 2 (two) times daily. Use to check blood sugars twice a day Dx. E11.9 1 kit 0  . brompheniramine-pseudoephedrine-DM 30-2-10 MG/5ML syrup Take 5 mLs by mouth 4 (four) times daily as needed. 120 mL 0  . butalbital-acetaminophen-caffeine (FIORICET, ESGIC) 50-325-40 MG tablet TAKE 1 TABLET BY MOUTH EVERY 6 HOURS AS NEEDED FOR HEADACHE (MAX OF 1 OR 2 TABLETS PER DAY) 30 tablet 5  . DENTA 5000 PLUS 1.1 % CREA dental cream   3  . Erenumab-aooe (AIMOVIG) 70 MG/ML SOAJ Inject 70 mg into the skin every 30 (thirty) days. 1 pen 0  . esomeprazole (NEXIUM) 40 MG capsule TAKE 1 CAPSULE BY MOUTH 2 TIMES DAILY BEFORE A MEAL. 180 capsule 1  . estradiol (ESTRACE) 0.1 MG/GM vaginal cream     . flurbiprofen (ANSAID) 100 MG tablet Take 1 tablet (100 mg total) by mouth every 8 (eight) hours as needed. Take no more than 3065min 24 hours 30 tablet 3  . gabapentin (NEURONTIN) 300 MG capsule TAKE 1 CAPSULE BY MOUTH 2 TIMES DAILY. 60 capsule 3  . hydrOXYzine (ATARAX/VISTARIL) 10 MG tablet Take 1 tablet (10 mg total) by mouth 3 (three) times daily as needed. 30 tablet 0  . insulin aspart (NOVOLOG FLEXPEN) 100 UNIT/ML FlexPen Inject 6-10 Units into the skin 3 (three) times daily before  meals. 15 mL 11  . Insulin Glargine (LANTUS SOLOSTAR) 100 UNIT/ML Solostar Pen INJECT 40 UNITS IN THE MORNING. 33 mL 1  . insulin lispro (HUMALOG KWIKPEN) 100 UNIT/ML KiwkPen Inject 0.15 mLs (15 Units total) into the skin 3 (three) times daily. 30 mL 5  . Insulin Pen Needle (UNIFINE PENTIPS) 32G X 4 MM MISC USE 4x a day 200 each PRN  . JANUVIA 100 MG tablet TAKE 1 TABLET (100 MG TOTAL) BY MOUTH DAILY. 90 tablet 1  . meloxicam (MOBIC) 15 MG tablet TAKE 1 TABLET (15 MG TOTAL) BY MOUTH DAILY. 90 tablet 0  . metFORMIN (GLUCOPHAGE-XR) 500 MG 24  hr tablet TAKE 2 TABLETS BY MOUTH 2 TIMES DAILY WITH A MEAL. 120 tablet 5  . methocarbamol (ROBAXIN) 500 MG tablet Take 1 tablet (500 mg total) by mouth 2 (two) times daily as needed for muscle spasms. 30 tablet 2  . naproxen (NAPROSYN) 500 MG tablet TAKE 1 TABLET BY MOUTH 2 TIMES DAILY WITH A MEAL 60 tablet 0  . nystatin-triamcinolone ointment (MYCOLOG)     . OnabotulinumtoxinA (BOTOX IJ) Inject 1 each as directed every 3 (three) months.    . ondansetron (ZOFRAN) 4 MG tablet TAKE 1 TABLET BY MOUTH EVERY 8 HOURS AS NEEDED FOR NAUSEA OR VOMITING. 20 tablet 1  . rosuvastatin (CRESTOR) 40 MG tablet Take 1 tablet (40 mg total) by mouth daily. 90 tablet 3  . traMADol (ULTRAM) 50 MG tablet TAKE 1 TABLET BY MOUTH EVERY 8 HOURS AS NEEDED 60 tablet 2  . Cetirizine HCl 10 MG CAPS Take 1 capsule (10 mg total) by mouth daily for 15 days. 15 capsule 0  . fluticasone (FLONASE) 50 MCG/ACT nasal spray Place 1-2 sprays into both nostrils daily for 7 days. 1 g 0  . predniSONE (DELTASONE) 20 MG tablet Take 1 tablet (20 mg total) by mouth daily with breakfast. 5 tablet 0  . SUMAtriptan Succinate (ZEMBRACE SYMTOUCH) 3 MG/0.5ML SOAJ Inject 1 Device into the skin once. May repeat dose once after 1 hour if headache persists or returns (do not exceed two injections in 24 hours) 9 pen 0   No current facility-administered medications for this visit.     Allergies: Lipitor [atorvastatin] and Nitroglycerin  Past Medical History:  Diagnosis Date  . ALLERGIC RHINITIS 10/04/2007  . Anemia 01/21/2011  . ASTHMA 08/02/2007   INHALERS ONLY IN [place]  . Blood transfusion without reported diagnosis    with first child   . COMMON MIGRAINE 05/15/2009  . DIABETES MELLITUS, TYPE II 08/02/2007  . GERD 08/02/2007  . HYPERLIPIDEMIA 08/02/2007  . INSOMNIA-SLEEP DISORDER-UNSPEC 01/04/2008  . INTERMITTENT VERTIGO 05/15/2009  . LIBIDO, DECREASED 01/09/2010    Past Surgical History:  Procedure Laterality Date  . CESAREAN SECTION     x 3  .  CYST EXCISION     left knee  . PAROTIDECTOMY  10/21/2011   Procedure: PAROTIDECTOMY;  Surgeon: Melida Quitter, MD;  Location: [facility];  Service: ENT;  Laterality: Left;    Family History  Problem Relation Age of Onset  . Hypertension Father   . Diabetes Father   . Asthma Father   . Cervical cancer Maternal Aunt   . Heart disease Maternal Aunt   . Anesthesia problems Neg Hx   . Colon cancer Neg Hx     Social History   Tobacco Use  . Smoking status: Never Smoker  . Smokeless tobacco: Never Used  Substance Use Topics  . Alcohol use: No  Alcohol/week: 0.0 standard drinks    Subjective:  Patient saw her PCP 2 days ago for CPE; mentioned that she had been cleaning her house with combination of bleach, [place] and dish detergent; felt like she was having a reaction to the chemicals; she notes PCP offered her a steroid injection at that time but she declined; notes that her chest felt "tight" last night and worried about wheezing/ complications- would like to discuss treatment options today; Of note, is a diabetic; Hgba1c not well controlled at 11;    Objective:  Vitals:   08/25/18 1511  BP: 112/74  Pulse: 91  Temp: 98.5 F (36.9 C)  TempSrc: Oral  SpO2: 97%  Weight: 144 lb (65.3 kg)  Height: _0  (1.549 m)    General: Well developed, well nourished, in no acute distress  Skin : Warm and dry.  Head: Normocephalic and atraumatic  Eyes: Sclera and conjunctiva clear; pupils round and reactive to light; extraocular movements intact  Ears: External normal; canals clear; tympanic membranes normal  Oropharynx: Pink, supple. No suspicious lesions  Neck: Supple without thyromegaly, adenopathy  Lungs: Respirations unlabored; clear to auscultation bilaterally without wheeze, rales, rhonchi  CVS exam: normal rate and regular rhythm.  Neurologic: Alert and oriented; speech intact; face symmetrical; moves all extremities well; CNII-XII intact without focal deficit   Assessment:  1. Chest  tightness   2. Allergic reaction to chemical substance, accidental or unintentional, subsequent encounter     Plan:  Update CXR; physical exam is reassuring; Rx for prednisone 20 mg qd x 5 days; follow-up worse, no better.   No follow-ups on file.  Orders Placed This Encounter  Procedures  . DG Chest 2 View    Standing Status:   Future    Number of Occurrences:   1    Standing Expiration Date:   10/25/2019    Order Specific Question:   Reason for Exam (SYMPTOM  OR DIAGNOSIS REQUIRED)    Answer:   chest tightness    Order Specific Question:   Is patient pregnant?    Answer:   No    Order Specific Question:   Preferred imaging location?    Answer:   [facility]    Order Specific Question:   Radiology Contrast Protocol - do NOT remove file path    Answer:   [email or web address]    Requested Prescriptions   Signed Prescriptions Disp Refills  . predniSONE (DELTASONE) 20 MG tablet 5 tablet 0    Sig: Take 1 tablet (20 mg total) by mouth daily with breakfast.

## 2018-08-28 ENCOUNTER — Encounter: Payer: Self-pay | Admitting: Internal Medicine

## 2018-08-30 ENCOUNTER — Other Ambulatory Visit: Payer: Self-pay | Admitting: Internal Medicine

## 2018-08-30 NOTE — Telephone Encounter (Signed)
Done erx 

## 2018-08-31 ENCOUNTER — Telehealth: Payer: Self-pay | Admitting: Internal Medicine

## 2018-08-31 NOTE — Telephone Encounter (Signed)
Copied from [place] [id number]. Topic: Quick Communication - See Telephone Encounter >> Aug 31, 2018  3:11 PM Loma Boston wrote: CRM for notification. See Telephone encounter for: 08/31/18.Pt called in , was wanting to making sure that we were aware that the FMLA papers had been sent in 3 times and they are waiting for it to be faxed back to the insurance company

## 2018-08-31 NOTE — Telephone Encounter (Signed)
Spoke with patient and informed her we have not received any forms. I have given her the new fax number.

## 2018-09-06 ENCOUNTER — Encounter: Payer: Self-pay | Admitting: Neurology

## 2018-09-06 NOTE — Progress Notes (Signed)
Aimovig approval received. LOGAN VEGH (Key: [id number]) - [id number] Aimovig 70MG /ML auto-injectors Outcome: Approved  Created: February 12th, 2020 [phone]  Sent: February 17th, 2020

## 2018-09-07 NOTE — Progress Notes (Signed)
Virtual Visit via Video Note The purpose of this virtual visit is to provide medical care while limiting exposure to the novel coronavirus.    Consent was obtained for video visit:  Yes.   Answered questions that patient had about telehealth interaction:  Yes.   I discussed the limitations, risks, security and privacy concerns of performing an evaluation and management service by telemedicine. I also discussed with the patient that there may be a patient responsible charge related to this service. The patient expressed understanding and agreed to proceed.  Pt location: Home Physician Location: office Name of referring provider:  Biagio Borg, MD I connected with Rachel [name] at patients initiation/request on 09/08/2018 at  1:00 PM EDT by video enabled telemedicine application and verified that I am speaking with the correct person using two identifiers. Pt MRN:  [id number] Pt DOB:  [birth year]   History of Present Illness:  Rachel [name] is a 53 year old right-handed female with type 2 diabetes, hyperlipidemia, GERD, asthma, and migraine who follows up for migraine.  UPDATE: She has had an increase in migraines over past month.  Her scalp feels numb.   Intensity:  severe Duration:  6 hours Frequency:  3 days a week Current NSAIDS: naproxen 500mg  Current analgesics: Fioricet.  Excedrin.  Tramadol (for back pain, ineffective for headache) Current triptans: None Current ergotamine: None Current anti-emetic: Zofran ODT 8 mg Current muscle relaxants: Robaxin Current anti-anxiolytic: None Current sleep aide: None Current Antihypertensive medications: None Current Antidepressant medications: None Current Anticonvulsant medications: Gabapentin 300 mg twice daily (for back pain) Current anti-CGRP: Aimovig 70 mg Current Vitamins/Herbal/Supplements: Turmeric Current Antihistamines/Decongestants: None Other therapy: OMM  Caffeine: One cup of coffee daily Diet: Drinks plenty of water.  No  soda. Exercise: No Depression: No; Anxiety: yes.  Her mother has been ill.  She has been confused.  Other pain: Knee pain. Sleep hygiene: Poor.  Worrying about her mother.  HISTORY: Onset:2011.She does have remote history of headaches when she was younger. Location:Back of head and radiates to the front Quality:Shooting up from back of head, pounding on top and front of head Initial Intensity:8/10 Aura:no Prodrome:no Associated symptoms:Nausea, photophobia, phonophobia, blurred vision (she gets annual eye exams due to diabetes) Initial Duration:All day Initial Frequency:Almost daily (cannot function 6 days per month) Triggers/exacerbating factors:wine Relieving factors:No Activity:Cannot work about 6 days per month (she works as an Advertising account planner)  Past NSAIDS: Cambia (made headache worse), flurbiprofen, Sprix nasal spray, ibuprofen Past analgesics: Fioricet, Excedrin, Tylenol Past abortive triptans: Sumatriptan 100 mg, Zembrace-SymTouch (did not like the feeling), Zomig 5 mg NS (did not like the way it made her feel), Maxalt (dizziness, ineffective) Past abortive ergotamine: Cafergot Past muscle relaxants: None Past anti-emetic: None Past antihypertensive medications: Atenolol Past antidepressant medications: Venlafaxine Exar 150 mg Past anticonvulsant medications: Topiramate, Depakote Past anti-CGRP: None Past vitamins/Herbal/Supplements: None Past antihistamines/decongestants: None Other past therapies: Botox  Frequent Falls: For the past few years, she has had falls, but they have become more frequent over the past year. Previously, she was diagnosed with vertigo. However, she reports that she doesn't note spinning or lightheadedness. She says she "just falls". On one occasion, she was pushing the seat forward while sitting and just fell over. Otherwise, it always occurs while walking or on her feet. She denies double vision or focal  numbness or weakness. MRI of the brain with seizure protocol was performed on 08/02/11, which was unremarkable. Sleep deprived EEG was normal. She has had PT, which was ineffective.  She does report that she sometimes trips while walking but doesn't fall. Cervical plain films from 05/07/15 showed mild degenerative disc disease at C5-6, but otherwise unremarkable. Lumbar films from 06/10/15 were personally reviewed and were negative. She underwent anothner MRI of brain with and without contrast on 02/10/16, and was stable compared to prior MRI from 2013. NCV-EMG from 05/13/16 was negative for neuropathy or lumbosacral radiculopathy.    Observations/Objective:   Weight 138 lb 9.6 oz (62.9 kg). alert and oriented to person, place, and time. Attention span and concentration intact, recent and remote memory intact, fund of knowledge intact.  Speech fluent and not dysarthric, language intact.  Pupils round and equal.  Eyes move in all directions.  Face symmetric.  Facial sensation intact.  Tongue midline.  No pronator drift.  Able to move all extremities against gravity.  Finger to nose testing intact.  Gait normal, Romberg negative.   Assessment and Plan:   Migraine without aura, without status migrainosus, not intractable  1.  For preventative management, we will increase Aimovig to 140mg  monthly 2.  For abortive therapy, we will try Ubrelvy 100mg .  She is to stop Fioricet, naproxen, Excedrin and other analgesics. 3.  Limit use of pain relievers to no more than 2 days out of week to prevent risk of rebound or medication-overuse headache. 4.  Keep headache diary 5.  Exercise, hydration, caffeine cessation, sleep hygiene, monitor for and avoid triggers 6.  Consider:  magnesium citrate 400mg  daily, riboflavin 400mg  daily, and coenzyme Q10 100mg  three times daily 7.  Follow up in 4 months.   Follow Up Instructions:    -I discussed the assessment and treatment plan with the patient. The patient  was provided an opportunity to ask questions and all were answered. The patient agreed with the plan and demonstrated an understanding of the instructions.   The patient was advised to call back or seek an in-person evaluation if the symptoms worsen or if the condition fails to improve as anticipated.    Total Time spent in visit with the patient was:  11 minutes, of which more than 50% of the time was spent in counseling and/or coordinating care on above plan.   Pt understands and agrees with the plan of care outlined.     Dudley Major, DO

## 2018-09-08 ENCOUNTER — Other Ambulatory Visit: Payer: Self-pay

## 2018-09-08 ENCOUNTER — Telehealth (INDEPENDENT_AMBULATORY_CARE_PROVIDER_SITE_OTHER): Payer: No Typology Code available for payment source | Admitting: Neurology

## 2018-09-08 VITALS — Wt 138.6 lb

## 2018-09-08 DIAGNOSIS — G43009 Migraine without aura, not intractable, without status migrainosus: Secondary | ICD-10-CM | POA: Diagnosis not present

## 2018-09-08 MED ORDER — ERENUMAB-AOOE 140 MG/ML ~~LOC~~ SOAJ: 140.0000 mg | SUBCUTANEOUS | 5 refills | Status: DC

## 2018-09-08 MED ORDER — UBROGEPANT 100 MG PO TABS: 1.0000 | ORAL_TABLET | ORAL | 5 refills | Status: DC | PRN

## 2018-09-08 NOTE — Patient Instructions (Signed)
1.  Increase Aimovig to 140mg  monthly 2.  Stop all analgesics (Fioricet, naproxen, Excedrin, etc).  Instead, when you get a headache, take Ubrelvy 100mg .  May repeat dose once after 2 hours if needed.  Maximum 2 tablets in 24 hours. 3.  Limit use of pain relievers to no more than 2 days out of week to prevent risk of rebound or medication-overuse headache. 4.  Keep headache diary 5.  Follow up in 4 months.

## 2018-09-11 ENCOUNTER — Telehealth: Payer: Self-pay | Admitting: Neurology

## 2018-09-11 MED ORDER — LASMIDITAN SUCCINATE 100 MG PO TABS: 100.0000 mg | ORAL_TABLET | ORAL | 0 refills | Status: DC | PRN

## 2018-09-11 MED ORDER — LASMIDITAN SUCCINATE 100 MG PO TABS: 100.0000 mg | ORAL_TABLET | ORAL | 3 refills | Status: DC | PRN

## 2018-09-11 NOTE — Telephone Encounter (Signed)
Patient states that Dr Tomi Likens was going to call in a medication and she was told that we would have to do a prior auth on it. She does not know the name of the medication

## 2018-09-12 ENCOUNTER — Other Ambulatory Visit: Payer: Self-pay | Admitting: Neurology

## 2018-09-12 MED ORDER — LASMIDITAN SUCCINATE 100 MG PO TABS: 100.0000 mg | ORAL_TABLET | ORAL | 3 refills | Status: DC | PRN

## 2018-09-12 NOTE — Progress Notes (Signed)
Rachel [name] on backorder.  Will prescribe Reyvow 100mg 

## 2018-09-13 ENCOUNTER — Ambulatory Visit: Payer: 59 | Admitting: Neurology

## 2018-09-21 ENCOUNTER — Telehealth: Payer: Self-pay | Admitting: Neurology

## 2018-09-21 NOTE — Telephone Encounter (Signed)
Clint Lipps called from [facility] regarding this patient and wanting to make sure a Fax was received at our office for a medication Authorization Form for Rachel [name]? The reference # is [id number]. Thanks

## 2018-09-27 ENCOUNTER — Ambulatory Visit: Payer: No Typology Code available for payment source | Admitting: Family Medicine

## 2018-10-10 NOTE — Telephone Encounter (Signed)
Patient calling again about paperwork. She is requesting a call back asap.

## 2018-10-10 NOTE — Telephone Encounter (Signed)
LVM, to inform patient I have not ever received any forms for her. TO please ask them to send them again.

## 2018-10-18 ENCOUNTER — Ambulatory Visit: Payer: No Typology Code available for payment source | Admitting: Family Medicine

## 2018-10-18 ENCOUNTER — Encounter: Payer: Self-pay | Admitting: Family Medicine

## 2018-10-18 ENCOUNTER — Other Ambulatory Visit: Payer: Self-pay

## 2018-10-18 VITALS — BP 120/70 | HR 97 | Ht 61.0 in | Wt 150.0 lb

## 2018-10-18 DIAGNOSIS — M999 Biomechanical lesion, unspecified: Secondary | ICD-10-CM | POA: Diagnosis not present

## 2018-10-18 DIAGNOSIS — G8929 Other chronic pain: Secondary | ICD-10-CM

## 2018-10-18 DIAGNOSIS — M545 Low back pain, unspecified: Secondary | ICD-10-CM

## 2018-10-18 NOTE — Patient Instructions (Signed)
Good to see you Keep working on the exercises  See me again in 4-6 weeks

## 2018-10-18 NOTE — Assessment & Plan Note (Signed)
Decision today to treat with OMT was based on Physical Exam  After verbal consent patient was treated with HVLA, ME, FPR techniques in cervical, thoracic, rib lumbar and sacral areas  Patient tolerated the procedure well with improvement in symptoms  Patient given exercises, stretches and lifestyle modifications  See medications in patient instructions if given  Patient will follow up in 4-6 weeks 

## 2018-10-18 NOTE — Progress Notes (Signed)
[name] [address], [place] [address] Phone: [phone] Subjective:   I Rachel [name] am serving as a Education administrator for Dr. Hulan Saas.  I'm seeing this patient by the request  of:    CC: Back pain follow-up  OBS:[id number]  Rachel [name] is a 53 y.o. female coming in with complaint of back pain. States she's in pain.  Patient is noticing more discomfort in the back as well as the neck.  We have attempted to space out patient's manipulation secondary to the coronavirus.  Has noticed with more manual labor increasing discomfort and pain as well as with anxiety.  Patient denies any radiation down the arms or the legs.  Seems to be though the whole axial skeleton given her pain.    Past Medical History:  Diagnosis Date  . ALLERGIC RHINITIS 10/04/2007  . Anemia 01/21/2011  . ASTHMA 08/02/2007   INHALERS ONLY IN [place]  . Blood transfusion without reported diagnosis    with first child   . COMMON MIGRAINE 05/15/2009  . DIABETES MELLITUS, TYPE II 08/02/2007  . GERD 08/02/2007  . HYPERLIPIDEMIA 08/02/2007  . INSOMNIA-SLEEP DISORDER-UNSPEC 01/04/2008  . INTERMITTENT VERTIGO 05/15/2009  . LIBIDO, DECREASED 01/09/2010   Past Surgical History:  Procedure Laterality Date  . CESAREAN SECTION     x 3  . CYST EXCISION     left knee  . PAROTIDECTOMY  10/21/2011   Procedure: PAROTIDECTOMY;  Surgeon: Melida Quitter, MD;  [facility];  Service: ENT;  Laterality: Left;   Social History   Socioeconomic History  . Marital status: Married    Spouse name: [name]  . Number of children: 3  . Years of education: Degree  . Highest education level: Not on file  Occupational History  . Occupation: Research scientist (life sciences): [employer]  . Occupation: Chartered certified accountant    Employer: [employer]  . Financial resource strain: Not on file  . Food insecurity:    Worry: Not on file    Inability: Not on file  . Transportation needs:    Medical: Not on  file    Non-medical: Not on file  Tobacco Use  . Smoking status: Never Smoker  . Smokeless tobacco: Never Used  Substance and Sexual Activity  . Alcohol use: No    Alcohol/week: 0.0 standard drinks  . Drug use: No  . Sexual activity: Yes  Lifestyle  . Physical activity:    Days per week: Not on file    Minutes per session: Not on file  . Stress: Not on file  Relationships  . Social connections:    Talks on phone: Not on file    Gets together: Not on file    Attends religious service: Not on file    Active member of club or organization: Not on file    Attends meetings of clubs or organizations: Not on file    Relationship status: Not on file  Other Topics Concern  . Not on file  Social History Narrative   She has 3 daughters.   Patient has a 4 year degree.    Patient working at [employer].    Patient is married to Abney [place].   Allergies  Allergen Reactions  . Lipitor [Atorvastatin]     REACTION: myalgias  . Nitroglycerin    Family History  Problem Relation Age of Onset  . Hypertension Father   . Diabetes Father   .  Asthma Father   . Cervical cancer Maternal Aunt   . Heart disease Maternal Aunt   . Anesthesia problems Neg Hx   . Colon cancer Neg Hx     Current Outpatient Medications (Endocrine & Metabolic):  .  insulin aspart (NOVOLOG FLEXPEN) 100 UNIT/ML FlexPen, Inject 6-10 Units into the skin 3 (three) times daily before meals. .  Insulin Glargine (LANTUS SOLOSTAR) 100 UNIT/ML Solostar Pen, INJECT 40 UNITS IN THE MORNING. .  insulin lispro (HUMALOG KWIKPEN) 100 UNIT/ML KiwkPen, Inject 0.15 mLs (15 Units total) into the skin 3 (three) times daily. Marland Kitchen  JANUVIA 100 MG tablet, TAKE 1 TABLET (100 MG TOTAL) BY MOUTH DAILY. .  metFORMIN (GLUCOPHAGE-XR) 500 MG 24 hr tablet, TAKE 2 TABLETS BY MOUTH 2 TIMES DAILY WITH A MEAL. .  predniSONE (DELTASONE) 20 MG tablet, Take 1 tablet (20 mg total) by mouth daily with breakfast.  Current Outpatient Medications (Cardiovascular):   .  atenolol (TENORMIN) 25 MG tablet, Take 1 tablet (25 mg total) by mouth daily. Marland Kitchen  atenolol (TENORMIN) 25 MG tablet, Take 1 tablet (25 mg total) by mouth daily. X 7 days, then increase to 2 tabs ('50mg'$ ) daily .  rosuvastatin (CRESTOR) 40 MG tablet, TAKE 1 TABLET (40 MG TOTAL) BY MOUTH DAILY.  Current Outpatient Medications (Respiratory):  .  benzonatate (TESSALON) 200 MG capsule, Take 1 capsule (200 mg total) by mouth 3 (three) times daily as needed (with full glass of water). .  brompheniramine-pseudoephedrine-DM 30-2-10 MG/5ML syrup, Take 5 mLs by mouth 4 (four) times daily as needed. .  Cetirizine HCl 10 MG CAPS, Take 1 capsule (10 mg total) by mouth daily for 15 days. .  fluticasone (FLONASE) 50 MCG/ACT nasal spray, Place 1-2 sprays into both nostrils daily for 7 days.  Current Outpatient Medications (Analgesics):  .  aspirin 81 MG EC tablet, Take 81 mg by mouth daily.   .  butalbital-acetaminophen-caffeine (FIORICET, ESGIC) 50-325-40 MG tablet, TAKE 1 TABLET BY MOUTH EVERY 6 HOURS AS NEEDED FOR HEADACHE (MAX OF 1 OR 2 TABLETS PER DAY) .  Erenumab-aooe (AIMOVIG) 140 MG/ML SOAJ, Inject 140 mg into the skin every 30 (thirty) days. .  flurbiprofen (ANSAID) 100 MG tablet, Take 1 tablet (100 mg total) by mouth every 8 (eight) hours as needed. Take no more than '300mg'$  in 24 hours .  Lasmiditan Succinate (REYVOW) 100 MG TABS, Take 100 mg by mouth as needed. No more than 1 tablet in 24 hours .  Lasmiditan Succinate (REYVOW) 100 MG TABS, Take 100 mg by mouth as needed. Take 1 tablet for headache. No more than 1 tablet in 24 hours .  meloxicam (MOBIC) 15 MG tablet, TAKE 1 TABLET (15 MG TOTAL) BY MOUTH DAILY. .  naproxen (NAPROSYN) 500 MG tablet, TAKE 1 TABLET BY MOUTH 2 TIMES DAILY WITH A MEAL .  traMADol (ULTRAM) 50 MG tablet, TAKE 1 TABLET BY MOUTH EVERY 8 HOURS AS NEEDED .  Ubrogepant (UBRELVY) 100 MG TABS, Take 1 tablet by mouth every 2 (two) hours as needed (Maximum 2 tablets/24 hours). .   SUMAtriptan Succinate (ZEMBRACE SYMTOUCH) 3 MG/0.5ML SOAJ, Inject 1 Device into the skin once. May repeat dose once after 1 hour if headache persists or returns (do not exceed two injections in 24 hours)   Current Outpatient Medications (Other):  Marland Kitchen  Accu-Chek FastClix Lancets MISC, USE TO CHECK BLOOD SUGARS TWICE TIMES A DAY .  ACCU-CHEK GUIDE test strip, USE TO CHECK BLOOD SUGARS TWICE A DAY DX E11.9 .  Blood Glucose Monitoring Suppl (ACCU-CHEK GUIDE) w/Device KIT, 1 Device by Does not apply route 2 (two) times daily. Use to check blood sugars twice a day Dx. E11.9 .  DENTA 5000 PLUS 1.1 % CREA dental cream,  .  esomeprazole (NEXIUM) 40 MG capsule, TAKE 1 CAPSULE BY MOUTH 2 TIMES DAILY BEFORE A MEAL. Marland Kitchen  estradiol (ESTRACE) 0.1 MG/GM vaginal cream,  .  gabapentin (NEURONTIN) 300 MG capsule, TAKE 1 CAPSULE BY MOUTH 2 TIMES DAILY. .  hydrOXYzine (ATARAX/VISTARIL) 10 MG tablet, Take 1 tablet (10 mg total) by mouth 3 (three) times daily as needed. .  Insulin Pen Needle (UNIFINE PENTIPS) 32G X 4 MM MISC, USE 4x a day .  methocarbamol (ROBAXIN) 500 MG tablet, Take 1 tablet (500 mg total) by mouth 2 (two) times daily as needed for muscle spasms. Marland Kitchen  nystatin-triamcinolone ointment (MYCOLOG),  .  OnabotulinumtoxinA (BOTOX IJ), Inject 1 each as directed every 3 (three) months. .  ondansetron (ZOFRAN) 4 MG tablet, TAKE 1 TABLET BY MOUTH EVERY 8 HOURS AS NEEDED FOR NAUSEA OR VOMITING.    Past medical history, social, surgical and family history all reviewed in electronic medical record.  No pertanent information unless stated regarding to the chief complaint.   Review of Systems:  No visual changes, nausea, vomiting, diarrhea, constipation, dizziness, abdominal pain, skin rash, fevers, chills, night sweats, weight loss, swollen lymph nodes, body aches, joint swelling, , chest pain, shortness of breath, mood changes.  Positive muscle aches, headaches  Objective  Blood pressure 120/70, pulse 97, height  '5\' 1"'$  (1.549 m), weight 150 lb (68 kg), SpO2 92 %.    General: No apparent distress alert and oriented x3 mood and affect normal, dressed appropriately.  HEENT: Pupils equal, extraocular movements intact  Respiratory: Patient's speak in full sentences and does not appear short of breath  Cardiovascular: No lower extremity edema, non tender, no erythema  Skin: Warm dry intact with no signs of infection or rash on extremities or on axial skeleton.  Abdomen: Soft nontender  Neuro: Cranial nerves II through XII are intact, neurovascularly intact in all extremities with 2+ DTRs and 2+ pulses.  Lymph: No lymphadenopathy of posterior or anterior cervical chain or axillae bilaterally.  Gait normal with good balance and coordination.  MSK:  Non tender with full range of motion and good stability and symmetric strength and tone of shoulders, elbows, wrist, hip, knee and ankles bilaterally.  Patient's neck does have loss of lordosis.  Significant tightness noted between the periscapular regions.  Patient does have very mild scapular dyskinesis noted left greater than right.  Significant tightness in the trapezius bilaterally.  Negative Spurling's but severe amount of pain to even light palpation in the neck.  Patient's lower back also significant tightness but negative straight leg test.  5 out of 5 strength and near full range of motion.  Osteopathic findings t C6 flexed rotated and side bent left T3 extended rotated and side bent right inhaled third rib T9 extended rotated and side bent left L2 flexed rotated and side bent right Sacrum right on right     Impression and Recommendations:     This case required medical decision making of moderate complexity. The above documentation has been reviewed and is accurate and complete Lyndal Pulley, DO       Note: This dictation was prepared with Dragon dictation along with smaller phrase technology. Any transcriptional errors that result from this  process are unintentional.

## 2018-10-18 NOTE — Assessment & Plan Note (Signed)
Stable.  I do believe that patient is more muscle imbalances.  Patient does have some instability that is contributing to some of the discomfort and pain.  Patient will try to increase activity slowly over the course of next several days.  Follow-up again in 4 to 6 weeks

## 2018-10-20 NOTE — Telephone Encounter (Signed)
I have printed off the old forms from the past, updated these forms and faxed them to [employer]. Patient keeps calling about the forms yet we have not received any updated ones.   They have been faxed.

## 2018-10-25 ENCOUNTER — Other Ambulatory Visit: Payer: Self-pay | Admitting: Neurology

## 2018-11-01 ENCOUNTER — Telehealth: Payer: Self-pay | Admitting: Neurology

## 2018-11-01 NOTE — Telephone Encounter (Signed)
[facility] calling in about PA on the rayvow 100mg  medication. Please call them back at [phone] and ref # [id number].

## 2018-11-07 ENCOUNTER — Other Ambulatory Visit: Payer: Self-pay | Admitting: Internal Medicine

## 2018-11-07 ENCOUNTER — Other Ambulatory Visit: Payer: Self-pay

## 2018-11-07 ENCOUNTER — Ambulatory Visit (INDEPENDENT_AMBULATORY_CARE_PROVIDER_SITE_OTHER): Payer: No Typology Code available for payment source | Admitting: Internal Medicine

## 2018-11-07 DIAGNOSIS — J4520 Mild intermittent asthma, uncomplicated: Secondary | ICD-10-CM | POA: Diagnosis not present

## 2018-11-07 DIAGNOSIS — J019 Acute sinusitis, unspecified: Secondary | ICD-10-CM

## 2018-11-07 DIAGNOSIS — E119 Type 2 diabetes mellitus without complications: Secondary | ICD-10-CM

## 2018-11-07 DIAGNOSIS — K5900 Constipation, unspecified: Secondary | ICD-10-CM | POA: Diagnosis not present

## 2018-11-07 DIAGNOSIS — E1165 Type 2 diabetes mellitus with hyperglycemia: Secondary | ICD-10-CM | POA: Diagnosis not present

## 2018-11-07 DIAGNOSIS — Z794 Long term (current) use of insulin: Secondary | ICD-10-CM

## 2018-11-07 MED ORDER — LEVOFLOXACIN 500 MG PO TABS: 500.0000 mg | ORAL_TABLET | Freq: Every day | ORAL | 0 refills | Status: DC

## 2018-11-07 MED ORDER — POLYETHYLENE GLYCOL 3350 17 GM/SCOOP PO POWD: 17.0000 g | Freq: Two times a day (BID) | ORAL | 1 refills | Status: DC | PRN

## 2018-11-07 NOTE — Telephone Encounter (Signed)
Done erx 

## 2018-11-07 NOTE — Progress Notes (Signed)
Patient ID: Rachel [name], female   DOB: 1965-07-26, 53 y.o.   MRN: [id number]  Virtual Visit via Video Note  I connected with Rachel [name] on 11/07/18 at  4:20 PM EDT by a video enabled telemedicine application and verified that I am speaking with the correct person using two identifiers.  Location: Patient: at home Provider: at office   I discussed the limitations of evaluation and management by telemedicine and the availability of in person appointments. The patient expressed understanding and agreed to proceed.  History of Present Illness:  Here with 2-3 days acute onset fever, facial pain, pressure, headache, general weakness and malaise, and greenish d/c, with mild ST and cough, but pt denies chest pain, wheezing, increased sob or doe, orthopnea, PND, increased LE swelling, palpitations, dizziness or syncope.  Denies worsening reflux, abd pain, dysphagia, n/v, or blood, except has had worsening constipation with less activity it seems.   Pt denies polydipsia, polyuria Past Medical History:  Diagnosis Date  . ALLERGIC RHINITIS 10/04/2007  . Anemia 01/21/2011  . ASTHMA 08/02/2007   INHALERS ONLY IN [place]  . Blood transfusion without reported diagnosis    with first child   . COMMON MIGRAINE 05/15/2009  . DIABETES MELLITUS, TYPE II 08/02/2007  . GERD 08/02/2007  . HYPERLIPIDEMIA 08/02/2007  . INSOMNIA-SLEEP DISORDER-UNSPEC 01/04/2008  . INTERMITTENT VERTIGO 05/15/2009  . LIBIDO, DECREASED 01/09/2010   Past Surgical History:  Procedure Laterality Date  . CESAREAN SECTION     x 3  . CYST EXCISION     left knee  . PAROTIDECTOMY  10/21/2011   Procedure: PAROTIDECTOMY;  Surgeon: Melida Quitter, MD;  Location: [facility];  Service: ENT;  Laterality: Left;    reports that she has never smoked. She has never used smokeless tobacco. She reports that she does not drink alcohol or use drugs. family history includes Asthma in her father; Cervical cancer in her maternal aunt; Diabetes in her father; Heart  disease in her maternal aunt; Hypertension in her father. Allergies  Allergen Reactions  . Lipitor [Atorvastatin]     REACTION: myalgias  . Nitroglycerin    Current Outpatient Medications on File Prior to Visit  Medication Sig Dispense Refill  . Accu-Chek FastClix Lancets MISC USE TO CHECK BLOOD SUGARS TWICE TIMES A DAY 102 each 3  . aspirin 81 MG EC tablet Take 81 mg by mouth daily.      Marland Kitchen atenolol (TENORMIN) 25 MG tablet Take 1 tablet (25 mg total) by mouth daily. 30 tablet 2  . atenolol (TENORMIN) 25 MG tablet Take 1 tablet (25 mg total) by mouth daily. X 7 days, then increase to 2 tabs (52m) daily 60 tablet 3  . benzonatate (TESSALON) 200 MG capsule Take 1 capsule (200 mg total) by mouth 3 (three) times daily as needed (with full glass of water). 20 capsule 0  . Blood Glucose Monitoring Suppl (ACCU-CHEK GUIDE) w/Device KIT 1 Device by Does not apply route 2 (two) times daily. Use to check blood sugars twice a day Dx. E11.9 1 kit 0  . brompheniramine-pseudoephedrine-DM 30-2-10 MG/5ML syrup Take 5 mLs by mouth 4 (four) times daily as needed. 120 mL 0  . butalbital-acetaminophen-caffeine (FIORICET, ESGIC) 50-325-40 MG tablet TAKE 1 TABLET BY MOUTH EVERY 6 HOURS AS NEEDED FOR HEADACHE (MAX OF 1 OR 2 TABLETS PER DAY) 30 tablet 5  . DENTA 5000 PLUS 1.1 % CREA dental cream   3  . Erenumab-aooe (AIMOVIG) 140 MG/ML SOAJ Inject 140 mg into the skin every  30 (thirty) days. 1 pen 5  . esomeprazole (NEXIUM) 40 MG capsule TAKE 1 CAPSULE BY MOUTH 2 TIMES DAILY BEFORE A MEAL. 180 capsule 1  . estradiol (ESTRACE) 0.1 MG/GM vaginal cream     . flurbiprofen (ANSAID) 100 MG tablet Take 1 tablet (100 mg total) by mouth every 8 (eight) hours as needed. Take no more than 34m in 24 hours 30 tablet 3  . gabapentin (NEURONTIN) 300 MG capsule TAKE 1 CAPSULE BY MOUTH 2 TIMES DAILY. 60 capsule 3  . glucose blood (ACCU-CHEK GUIDE) test strip Use as directed once daily E11.9 100 each 3  . hydrOXYzine  (ATARAX/VISTARIL) 10 MG tablet Take 1 tablet (10 mg total) by mouth 3 (three) times daily as needed. 30 tablet 0  . insulin aspart (NOVOLOG FLEXPEN) 100 UNIT/ML FlexPen Inject 6-10 Units into the skin 3 (three) times daily before meals. 15 mL 11  . Insulin Glargine (LANTUS SOLOSTAR) 100 UNIT/ML Solostar Pen INJECT 40 UNITS IN THE MORNING. 33 mL 1  . insulin lispro (HUMALOG KWIKPEN) 100 UNIT/ML KiwkPen Inject 0.15 mLs (15 Units total) into the skin 3 (three) times daily. 30 mL 5  . Insulin Pen Needle (UNIFINE PENTIPS) 32G X 4 MM MISC USE 4x a day 200 each PRN  . JANUVIA 100 MG tablet TAKE 1 TABLET (100 MG TOTAL) BY MOUTH DAILY. 90 tablet 1  . Lasmiditan Succinate (REYVOW) 100 MG TABS Take 100 mg by mouth as needed. No more than 1 tablet in 24 hours 4 tablet 0  . Lasmiditan Succinate (REYVOW) 100 MG TABS Take 100 mg by mouth as needed. Take 1 tablet for headache. No more than 1 tablet in 24 hours 8 tablet 3  . meloxicam (MOBIC) 15 MG tablet TAKE 1 TABLET (15 MG TOTAL) BY MOUTH DAILY. 90 tablet 0  . metFORMIN (GLUCOPHAGE-XR) 500 MG 24 hr tablet TAKE 2 TABLETS BY MOUTH 2 TIMES DAILY WITH A MEAL. 360 tablet 3  . methocarbamol (ROBAXIN) 500 MG tablet Take 1 tablet (500 mg total) by mouth 2 (two) times daily as needed for muscle spasms. 30 tablet 2  . naproxen (NAPROSYN) 500 MG tablet TAKE 1 TABLET BY MOUTH 2 TIMES DAILY WITH A MEAL 60 tablet 0  . nystatin-triamcinolone ointment (MYCOLOG)     . OnabotulinumtoxinA (BOTOX IJ) Inject 1 each as directed every 3 (three) months.    . ondansetron (ZOFRAN) 4 MG tablet TAKE 1 TABLET BY MOUTH EVERY 8 HOURS AS NEEDED FOR NAUSEA OR VOMITING. 30 tablet 1  . predniSONE (DELTASONE) 20 MG tablet Take 1 tablet (20 mg total) by mouth daily with breakfast. 5 tablet 0  . rosuvastatin (CRESTOR) 40 MG tablet TAKE 1 TABLET (40 MG TOTAL) BY MOUTH DAILY. 90 tablet 3  . traMADol (ULTRAM) 50 MG tablet TAKE 1 TABLET BY MOUTH EVERY 8 HOURS AS NEEDED 60 tablet 1  . Ubrogepant  (UBRELVY) 100 MG TABS Take 1 tablet by mouth every 2 (two) hours as needed (Maximum 2 tablets/24 hours). 20 tablet 5  . Cetirizine HCl 10 MG CAPS Take 1 capsule (10 mg total) by mouth daily for 15 days. 15 capsule 0  . fluticasone (FLONASE) 50 MCG/ACT nasal spray Place 1-2 sprays into both nostrils daily for 7 days. 1 g 0  . SUMAtriptan Succinate (ZEMBRACE SYMTOUCH) 3 MG/0.5ML SOAJ Inject 1 Device into the skin once. May repeat dose once after 1 hour if headache persists or returns (do not exceed two injections in 24 hours) 9 pen 0  No current facility-administered medications on file prior to visit.     Observations/Objective: Alert, NAD, mild ill appearing appropriate mood and affect, resps normal, cn 2-12 intact, moves all 4s, no visible rash or swelling Lab Results  Component Value Date   WBC 10.7 (H) 08/23/2018   HGB 12.4 08/23/2018   HCT 37.2 08/23/2018   PLT 399.0 08/23/2018   GLUCOSE 311 (H) 08/23/2018   CHOL 274 (H) 08/23/2018   TRIG 322.0 (H) 08/23/2018   HDL 55.20 08/23/2018   LDLDIRECT 191.0 08/23/2018   LDLCALC 194 (H) 11/23/2017   ALT 12 08/23/2018   AST 10 08/23/2018   NA 134 (L) 08/23/2018   K 3.9 08/23/2018   CL 100 08/23/2018   CREATININE 0.72 08/23/2018   BUN 21 08/23/2018   CO2 25 08/23/2018   TSH 0.54 08/23/2018   INR 1.08 07/23/2009   HGBA1C 11.2 (H) 08/23/2018   MICROALBUR <0.7 06/25/2016   Assessment and Plan: See notes  Follow Up Instructions: See notes   I discussed the assessment and treatment plan with the patient. The patient was provided an opportunity to ask questions and all were answered. The patient agreed with the plan and demonstrated an understanding of the instructions.   The patient was advised to call back or seek an in-person evaluation if the symptoms worsen or if the condition fails to improve as anticipated.  Cathlean Cower, MD

## 2018-11-07 NOTE — Patient Instructions (Signed)
Please take all new medication as prescribed - the antibiotic, and miralax for constipation  Please continue all other medications as before, and refills have been done if requested.  Please have the pharmacy call with any other refills you may need.  Please keep your appointments with your specialists as you may have planned

## 2018-11-08 ENCOUNTER — Other Ambulatory Visit: Payer: Self-pay

## 2018-11-08 ENCOUNTER — Ambulatory Visit
Admission: RE | Admit: 2018-11-08 | Discharge: 2018-11-08 | Disposition: A | Discharge status: Home / Self Care | Payer: No Typology Code available for payment source | Source: Ambulatory Visit | Attending: Internal Medicine | Admitting: Internal Medicine

## 2018-11-08 DIAGNOSIS — Z1231 Encounter for screening mammogram for malignant neoplasm of breast: Secondary | ICD-10-CM

## 2018-11-12 ENCOUNTER — Encounter: Payer: Self-pay | Admitting: Internal Medicine

## 2018-11-12 DIAGNOSIS — K5900 Constipation, unspecified: Secondary | ICD-10-CM | POA: Insufficient documentation

## 2018-11-12 DIAGNOSIS — J019 Acute sinusitis, unspecified: Secondary | ICD-10-CM | POA: Insufficient documentation

## 2018-11-12 NOTE — Assessment & Plan Note (Signed)
stable overall by history and exam, recent data reviewed with pt, and pt to continue medical treatment as before,  to f/u any worsening symptoms or concerns  

## 2018-11-12 NOTE — Assessment & Plan Note (Signed)
Ok for miralax daily prn,  to f/u any worsening symptoms or concerns 

## 2018-11-12 NOTE — Assessment & Plan Note (Signed)
Mild to mod, for antibx course,  to f/u any worsening symptoms or concerns 

## 2018-11-12 NOTE — Assessment & Plan Note (Signed)
Has had severe uncontrolled recently, will cont to follow with Endo for better control

## 2018-11-14 ENCOUNTER — Ambulatory Visit (INDEPENDENT_AMBULATORY_CARE_PROVIDER_SITE_OTHER)
Admission: RE | Admit: 2018-11-14 | Discharge: 2018-11-14 | Disposition: A | Discharge status: Home / Self Care | Payer: No Typology Code available for payment source | Source: Ambulatory Visit | Attending: Internal Medicine | Admitting: Internal Medicine

## 2018-11-14 ENCOUNTER — Encounter: Payer: Self-pay | Admitting: Internal Medicine

## 2018-11-14 ENCOUNTER — Other Ambulatory Visit (INDEPENDENT_AMBULATORY_CARE_PROVIDER_SITE_OTHER): Payer: No Typology Code available for payment source

## 2018-11-14 ENCOUNTER — Other Ambulatory Visit: Payer: Self-pay

## 2018-11-14 ENCOUNTER — Ambulatory Visit (INDEPENDENT_AMBULATORY_CARE_PROVIDER_SITE_OTHER): Payer: No Typology Code available for payment source | Admitting: Internal Medicine

## 2018-11-14 DIAGNOSIS — R109 Unspecified abdominal pain: Secondary | ICD-10-CM

## 2018-11-14 DIAGNOSIS — J019 Acute sinusitis, unspecified: Secondary | ICD-10-CM

## 2018-11-14 DIAGNOSIS — R0600 Dyspnea, unspecified: Secondary | ICD-10-CM | POA: Diagnosis not present

## 2018-11-14 DIAGNOSIS — K5900 Constipation, unspecified: Secondary | ICD-10-CM | POA: Diagnosis not present

## 2018-11-14 DIAGNOSIS — E1165 Type 2 diabetes mellitus with hyperglycemia: Secondary | ICD-10-CM | POA: Diagnosis not present

## 2018-11-14 DIAGNOSIS — Z794 Long term (current) use of insulin: Secondary | ICD-10-CM | POA: Diagnosis not present

## 2018-11-14 DIAGNOSIS — F419 Anxiety disorder, unspecified: Secondary | ICD-10-CM

## 2018-11-14 LAB — HEPATIC FUNCTION PANEL
ALT: 11 U/L (ref 0–35)
AST: 11 U/L (ref 0–37)
Albumin: 4.3 g/dL (ref 3.5–5.2)
Alkaline Phosphatase: 73 U/L (ref 39–117)
Bilirubin, Direct: 0.1 mg/dL (ref 0.0–0.3)
Total Bilirubin: 0.4 mg/dL (ref 0.2–1.2)
Total Protein: 7.3 g/dL (ref 6.0–8.3)

## 2018-11-14 LAB — URINALYSIS, ROUTINE W REFLEX MICROSCOPIC
Bilirubin Urine: NEGATIVE
Hgb urine dipstick: NEGATIVE
Ketones, ur: NEGATIVE
Leukocytes,Ua: NEGATIVE
Nitrite: NEGATIVE
RBC / HPF: NONE SEEN (ref 0–?)
Specific Gravity, Urine: 1.01 (ref 1.000–1.030)
Total Protein, Urine: NEGATIVE
Urine Glucose: >=1000 — AB
Urobilinogen, UA: 0.2 (ref 0.0–1.0)
pH: 6 (ref 5.0–8.0)

## 2018-11-14 LAB — CBC WITH DIFFERENTIAL/PLATELET
Basophils Absolute: 0 10*3/uL (ref 0.0–0.1)
Basophils Relative: 0.6 % (ref 0.0–3.0)
Eosinophils Absolute: 0.1 10*3/uL (ref 0.0–0.7)
Eosinophils Relative: 0.9 % (ref 0.0–5.0)
HCT: 37.8 % (ref 36.0–46.0)
Hemoglobin: 12.7 g/dL (ref 12.0–15.0)
Lymphocytes Relative: 57.1 % — ABNORMAL HIGH (ref 12.0–46.0)
Lymphs Abs: 4.5 10*3/uL — ABNORMAL HIGH (ref 0.7–4.0)
MCHC: 33.6 g/dL (ref 30.0–36.0)
MCV: 81.2 fl (ref 78.0–100.0)
Monocytes Absolute: 0.7 10*3/uL (ref 0.1–1.0)
Monocytes Relative: 8.6 % (ref 3.0–12.0)
Neutro Abs: 2.6 10*3/uL (ref 1.4–7.7)
Neutrophils Relative %: 32.8 % — ABNORMAL LOW (ref 43.0–77.0)
Platelets: 398 10*3/uL (ref 150.0–400.0)
RBC: 4.65 Mil/uL (ref 3.87–5.11)
RDW: 13.4 % (ref 11.5–15.5)
WBC: 7.9 10*3/uL (ref 4.0–10.5)

## 2018-11-14 LAB — BASIC METABOLIC PANEL WITH GFR
BUN: 7 mg/dL (ref 6–23)
CO2: 28 meq/L (ref 19–32)
Calcium: 9.1 mg/dL (ref 8.4–10.5)
Chloride: 97 meq/L (ref 96–112)
Creatinine, Ser: 0.67 mg/dL (ref 0.40–1.20)
GFR: 91.56 mL/min (ref >60.00)
Glucose, Bld: 247 mg/dL — ABNORMAL HIGH (ref 70–99)
Potassium: 3.5 meq/L (ref 3.5–5.1)
Sodium: 132 meq/L — ABNORMAL LOW (ref 135–145)

## 2018-11-14 LAB — BRAIN NATRIURETIC PEPTIDE: Pro B Natriuretic peptide (BNP): 10 pg/mL (ref 0.0–100.0)

## 2018-11-14 LAB — HEMOGLOBIN A1C: Hgb A1c MFr Bld: 10.2 % — ABNORMAL HIGH (ref 4.6–6.5)

## 2018-11-14 LAB — TSH: TSH: 0.84 u[IU]/mL (ref 0.35–4.50)

## 2018-11-14 LAB — LIPASE: Lipase: 33 U/L (ref 11.0–59.0)

## 2018-11-14 MED ORDER — AMOXICILLIN-POT CLAVULANATE 875-125 MG PO TABS: 1.0000 | ORAL_TABLET | Freq: Two times a day (BID) | ORAL | 0 refills | Status: DC

## 2018-11-14 MED ORDER — GLUCOSE BLOOD VI STRP: ORAL_STRIP | 12 refills | Status: DC

## 2018-11-14 MED ORDER — LINACLOTIDE 145 MCG PO CAPS: 145.0000 ug | ORAL_CAPSULE | Freq: Every day | ORAL | 2 refills | Status: DC

## 2018-11-14 MED ORDER — FREESTYLE LITE DEVI: 0 refills | Status: DC

## 2018-11-14 MED ORDER — FREESTYLE LANCETS MISC: 12 refills | Status: DC

## 2018-11-14 NOTE — Progress Notes (Signed)
Patient ID: Rachel [name], female   DOB: [birth year], 53 y.o.   MRN: [id number]  Virtual Visit via Video Note  I connected with Rachel [name] on 11/14/18 at  8:40 AM EDT by a video enabled telemedicine application and verified that I am speaking with the correct person using two identifiers.  Location: Patient: at home Provider: at office   I discussed the limitations of evaluation and management by telemedicine and the availability of in person appointments. The patient expressed understanding and agreed to proceed.  History of Present Illness: Here to f/u with c/o persistent constipation with nausea x 2 wks and worsening gen'd crampy abd pain. Passing gas and belching. No vomiting  No radiation of pain, and Denies worsening reflux, dysphagia, bowel change or blood. Nohting seems to make better or worse.  Sometimes seems associated with mild weakness, tired, dizzy, and sob.   BP 120/80 with 114 HR yesterday, o2 sat 98% per pt.  COVID negative may 27.  Unfortunately also levaquin seemed to make the nausea worse, still with sinus pain.  CBG's have been mild higher recently with illness in the 200's.  Denies worsening depressive symptoms, suicidal ideation, or panic; has ongoing anxiety.   Past Medical History:  Diagnosis Date  . ALLERGIC RHINITIS 10/04/2007  . Anemia 01/21/2011  . ASTHMA 08/02/2007   INHALERS ONLY IN [place]  . Blood transfusion without reported diagnosis    with first child   . COMMON MIGRAINE 05/15/2009  . DIABETES MELLITUS, TYPE II 08/02/2007  . GERD 08/02/2007  . HYPERLIPIDEMIA 08/02/2007  . INSOMNIA-SLEEP DISORDER-UNSPEC 01/04/2008  . INTERMITTENT VERTIGO 05/15/2009  . LIBIDO, DECREASED 01/09/2010   Past Surgical History:  Procedure Laterality Date  . CESAREAN SECTION     x 3  . CYST EXCISION     left knee  . PAROTIDECTOMY  10/21/2011   Procedure: PAROTIDECTOMY;  Surgeon: Melida Quitter, MD;  Location: [facility];  Service: ENT;  Laterality: Left;    reports that she has  never smoked. She has never used smokeless tobacco. She reports that she does not drink alcohol or use drugs. family history includes Asthma in her father; Cervical cancer in her maternal aunt; Diabetes in her father; Heart disease in her maternal aunt; Hypertension in her father. Allergies  Allergen Reactions  . Lipitor [Atorvastatin]     REACTION: myalgias  . Nitroglycerin    Current Outpatient Medications on File Prior to Visit  Medication Sig Dispense Refill  . aspirin 81 MG EC tablet Take 81 mg by mouth daily.      Marland Kitchen atenolol (TENORMIN) 25 MG tablet Take 1 tablet (25 mg total) by mouth daily. X 7 days, then increase to 2 tabs (50mg ) daily 60 tablet 3  . benzonatate (TESSALON) 200 MG capsule Take 1 capsule (200 mg total) by mouth 3 (three) times daily as needed (with full glass of water). 20 capsule 0  . brompheniramine-pseudoephedrine-DM 30-2-10 MG/5ML syrup Take 5 mLs by mouth 4 (four) times daily as needed. 120 mL 0  . butalbital-acetaminophen-caffeine (FIORICET, ESGIC) 50-325-40 MG tablet TAKE 1 TABLET BY MOUTH EVERY 6 HOURS AS NEEDED FOR HEADACHE (MAX OF 1 OR 2 TABLETS PER DAY) 30 tablet 5  . DENTA 5000 PLUS 1.1 % CREA dental cream   3  . Erenumab-aooe (AIMOVIG) 140 MG/ML SOAJ Inject 140 mg into the skin every 30 (thirty) days. 1 pen 5  . esomeprazole (NEXIUM) 40 MG capsule TAKE 1 CAPSULE BY MOUTH 2 TIMES DAILY BEFORE A MEAL.  180 capsule 1  . estradiol (ESTRACE) 0.1 MG/GM vaginal cream     . flurbiprofen (ANSAID) 100 MG tablet Take 1 tablet (100 mg total) by mouth every 8 (eight) hours as needed. Take no more than 300mg  in 24 hours 30 tablet 3  . gabapentin (NEURONTIN) 300 MG capsule TAKE 1 CAPSULE BY MOUTH 2 TIMES DAILY. 60 capsule 3  . hydrOXYzine (ATARAX/VISTARIL) 10 MG tablet Take 1 tablet (10 mg total) by mouth 3 (three) times daily as needed. 30 tablet 0  . insulin aspart (NOVOLOG FLEXPEN) 100 UNIT/ML FlexPen Inject 6-10 Units into the skin 3 (three) times daily before meals. 15  mL 11  . Insulin Glargine (LANTUS SOLOSTAR) 100 UNIT/ML Solostar Pen INJECT 40 UNITS IN THE MORNING. 33 mL 1  . insulin lispro (HUMALOG KWIKPEN) 100 UNIT/ML KiwkPen Inject 0.15 mLs (15 Units total) into the skin 3 (three) times daily. 30 mL 5  . Insulin Pen Needle (UNIFINE PENTIPS) 32G X 4 MM MISC USE 4x a day 200 each PRN  . JANUVIA 100 MG tablet TAKE 1 TABLET (100 MG TOTAL) BY MOUTH DAILY. 90 tablet 1  . Lasmiditan Succinate (REYVOW) 100 MG TABS Take 100 mg by mouth as needed. No more than 1 tablet in 24 hours 4 tablet 0  . Lasmiditan Succinate (REYVOW) 100 MG TABS Take 100 mg by mouth as needed. Take 1 tablet for headache. No more than 1 tablet in 24 hours 8 tablet 3  . meloxicam (MOBIC) 15 MG tablet TAKE 1 TABLET (15 MG TOTAL) BY MOUTH DAILY. 90 tablet 0  . metFORMIN (GLUCOPHAGE-XR) 500 MG 24 hr tablet TAKE 2 TABLETS BY MOUTH 2 TIMES DAILY WITH A MEAL. 360 tablet 3  . methocarbamol (ROBAXIN) 500 MG tablet Take 1 tablet (500 mg total) by mouth 2 (two) times daily as needed for muscle spasms. 30 tablet 2  . naproxen (NAPROSYN) 500 MG tablet TAKE 1 TABLET BY MOUTH 2 TIMES DAILY WITH A MEAL 60 tablet 0  . nystatin-triamcinolone ointment (MYCOLOG)     . OnabotulinumtoxinA (BOTOX IJ) Inject 1 each as directed every 3 (three) months.    . ondansetron (ZOFRAN) 4 MG tablet TAKE 1 TABLET BY MOUTH EVERY 8 HOURS AS NEEDED FOR NAUSEA OR VOMITING. 30 tablet 1  . polyethylene glycol powder (GLYCOLAX/MIRALAX) 17 GM/SCOOP powder Take 17 g by mouth 2 (two) times daily as needed. 3350 g 1  . predniSONE (DELTASONE) 20 MG tablet Take 1 tablet (20 mg total) by mouth daily with breakfast. 5 tablet 0  . rosuvastatin (CRESTOR) 40 MG tablet TAKE 1 TABLET (40 MG TOTAL) BY MOUTH DAILY. 90 tablet 3  . traMADol (ULTRAM) 50 MG tablet TAKE 1 TABLET BY MOUTH EVERY 8 HOURS AS NEEDED 60 tablet 1  . Ubrogepant (UBRELVY) 100 MG TABS Take 1 tablet by mouth every 2 (two) hours as needed (Maximum 2 tablets/24 hours). 20 tablet 5  .  Cetirizine HCl 10 MG CAPS Take 1 capsule (10 mg total) by mouth daily for 15 days. 15 capsule 0  . fluticasone (FLONASE) 50 MCG/ACT nasal spray Place 1-2 sprays into both nostrils daily for 7 days. 1 g 0  . SUMAtriptan Succinate (ZEMBRACE SYMTOUCH) 3 MG/0.5ML SOAJ Inject 1 Device into the skin once. May repeat dose once after 1 hour if headache persists or returns (do not exceed two injections in 24 hours) 9 pen 0   No current facility-administered medications on file prior to visit.    Observations/Objective: Alert, NAD, nervous but appropriate  mood and affect, resps normal, cn 2-12 intact, moves all 4s, no visible rash or swelling Lab Results  Component Value Date   WBC 7.9 11/14/2018   HGB 12.7 11/14/2018   HCT 37.8 11/14/2018   PLT 398.0 11/14/2018   GLUCOSE 247 (H) 11/14/2018   CHOL 274 (H) 08/23/2018   TRIG 322.0 (H) 08/23/2018   HDL 55.20 08/23/2018   LDLDIRECT 191.0 08/23/2018   LDLCALC 194 (H) 11/23/2017   ALT 11 11/14/2018   AST 11 11/14/2018   NA 132 (L) 11/14/2018   K 3.5 11/14/2018   CL 97 11/14/2018   CREATININE 0.67 11/14/2018   BUN 7 11/14/2018   CO2 28 11/14/2018   TSH 0.84 11/14/2018   INR 1.08 07/23/2009   HGBA1C 10.2 (H) 11/14/2018   MICROALBUR <0.7 06/25/2016   Assessment and Plan: See notes  Follow Up Instructions: See notes   I discussed the assessment and treatment plan with the patient. The patient was provided an opportunity to ask questions and all were answered. The patient agreed with the plan and demonstrated an understanding of the instructions.   The patient was advised to call back or seek an in-person evaluation if the symptoms worsen or if the condition fails to improve as anticipated.   Cathlean Cower, MD

## 2018-11-14 NOTE — Patient Instructions (Addendum)
OK to stop the levaquin  Please take all new medication as prescribed - the antibiotic, and the linzess as needed  Please continue all other medications as before, and refills have been done if requested.  Please have the pharmacy call with any other refills you may need.  Please continue your efforts at being more active, low cholesterol diet, and weight control  Please keep your appointments with your specialists as you may have planned  Please go to the XRAY Department in the Basement (go straight as you get off the elevator) for the x-ray testing  Please go to the LAB in the Basement (turn left off the elevator) for the tests to be done today  We may need to consider CT scan, and/or GI referral depending on the labs

## 2018-11-15 ENCOUNTER — Ambulatory Visit: Payer: No Typology Code available for payment source | Admitting: Family Medicine

## 2018-11-15 ENCOUNTER — Encounter: Payer: Self-pay | Admitting: Internal Medicine

## 2018-11-15 DIAGNOSIS — F419 Anxiety disorder, unspecified: Secondary | ICD-10-CM

## 2018-11-15 HISTORY — DX: Anxiety disorder, unspecified: F41.9

## 2018-11-15 NOTE — Assessment & Plan Note (Signed)
Etiology unclear, for cxr and BNP,  to f/u any worsening symptoms or concerns

## 2018-11-15 NOTE — Assessment & Plan Note (Signed)
Mild situational worsening, cont same tx

## 2018-11-15 NOTE — Assessment & Plan Note (Addendum)
Worsening x 2 wks, not better with OTC meds, for linzess asd  Note:  Total time for pt hx, exam, review of record with pt in the room, determination of diagnoses and plan for further eval and tx is > 40 min, with over 50% spent in coordination and counseling of patient including the differential dx, tx, further evaluation and other management of constipation, abd pain, sinusitis, anxeity, dyspnea, DM

## 2018-11-15 NOTE — Assessment & Plan Note (Signed)
To consider ct abd if linzess not helping

## 2018-11-15 NOTE — Assessment & Plan Note (Signed)
Mild situational worsening, cont same tx, reassured

## 2018-11-15 NOTE — Assessment & Plan Note (Signed)
Ok to change the levaquin to augmentin asd,  to f/u any worsening symptoms or concerns

## 2018-11-22 ENCOUNTER — Encounter: Payer: Self-pay | Admitting: Family Medicine

## 2018-11-22 ENCOUNTER — Ambulatory Visit: Payer: No Typology Code available for payment source | Admitting: Family Medicine

## 2018-11-22 ENCOUNTER — Other Ambulatory Visit: Payer: Self-pay

## 2018-11-22 ENCOUNTER — Ambulatory Visit: Payer: Self-pay

## 2018-11-22 VITALS — BP 100/80 | HR 103 | Ht 61.0 in | Wt 146.0 lb

## 2018-11-22 DIAGNOSIS — M79645 Pain in left finger(s): Secondary | ICD-10-CM | POA: Diagnosis not present

## 2018-11-22 DIAGNOSIS — M999 Biomechanical lesion, unspecified: Secondary | ICD-10-CM | POA: Diagnosis not present

## 2018-11-22 DIAGNOSIS — R51 Headache: Secondary | ICD-10-CM | POA: Diagnosis not present

## 2018-11-22 DIAGNOSIS — M65311 Trigger thumb, right thumb: Secondary | ICD-10-CM | POA: Diagnosis not present

## 2018-11-22 DIAGNOSIS — G8929 Other chronic pain: Secondary | ICD-10-CM | POA: Diagnosis not present

## 2018-11-22 DIAGNOSIS — G4486 Cervicogenic headache: Secondary | ICD-10-CM

## 2018-11-22 NOTE — Assessment & Plan Note (Signed)
Injection given today.  Tolerated the procedure well.  Bracing at night.  Discussed 2 weeks until a significant improvement.  Follow-up again in 4 to 6 weeks

## 2018-11-22 NOTE — Progress Notes (Signed)
Prior authorization for Aimovig submitted to covermymeds.  Waiting response.

## 2018-11-22 NOTE — Progress Notes (Signed)
[name] [place], [place] [address] Phone: [phone] Subjective:   I [name] am serving as a Education administrator for Dr. Hulan Saas.  CC: Neck and back pain follow-up  [id number]  Rachel [name] is a 53 y.o. female coming in with complaint of neck and back pain. States she is a little tight.  Patient did have an illness.  Patient is feeling much better from known.  Has been doing exercises secondary to the discomfort and pain.  Mild increase in headaches recently as well.     Past Medical History:  Diagnosis Date   ALLERGIC RHINITIS 10/04/2007   Anemia 01/21/2011   ASTHMA 08/02/2007   INHALERS ONLY IN [place]   Blood transfusion without reported diagnosis    with first child    COMMON MIGRAINE 05/15/2009   DIABETES MELLITUS, TYPE II 08/02/2007   GERD 08/02/2007   HYPERLIPIDEMIA 08/02/2007   INSOMNIA-SLEEP DISORDER-UNSPEC 01/04/2008   INTERMITTENT VERTIGO 05/15/2009   LIBIDO, DECREASED 01/09/2010   Past Surgical History:  Procedure Laterality Date   CESAREAN SECTION     x 3   CYST EXCISION     left knee   PAROTIDECTOMY  10/21/2011   Procedure: PAROTIDECTOMY;  Surgeon: Melida Quitter, MD;  Location: [facility];  Service: ENT;  Laterality: Left;   Social History   Socioeconomic History   Marital status: Married    Spouse name: [name]   Number of children: 3   Years of education: Degree   Highest education level: Not on file  Occupational History   Occupation: Chartered certified accountant    Employer: [employer]   Occupation: Chartered certified accountant    Employer: [employer] resource strain: Not on file   Food insecurity:    Worry: Not on file    Inability: Not on file   Transportation needs:    Medical: Not on file    Non-medical: Not on file  Tobacco Use   Smoking status: Never Smoker   Smokeless tobacco: Never Used  Substance and Sexual Activity   Alcohol use: No    Alcohol/week: 0.0  standard drinks   Drug use: No   Sexual activity: Yes  Lifestyle   Physical activity:    Days per week: Not on file    Minutes per session: Not on file   Stress: Not on file  Relationships   Social connections:    Talks on phone: Not on file    Gets together: Not on file    Attends religious service: Not on file    Active member of club or organization: Not on file    Attends meetings of clubs or organizations: Not on file    Relationship status: Not on file  Other Topics Concern   Not on file  Social History Narrative   She has 3 daughters.   Patient has a 4 year degree.    Patient working at [employer].    Patient is married to Turney.   Allergies  Allergen Reactions   Lipitor [Atorvastatin]     REACTION: myalgias   Nitroglycerin    Family History  Problem Relation Age of Onset   Hypertension Father    Diabetes Father    Asthma Father    Cervical cancer Maternal Aunt    Heart disease Maternal Aunt    Anesthesia problems Neg Hx    Colon cancer Neg Hx     Current Outpatient Medications (  Endocrine & Metabolic):    insulin aspart (NOVOLOG FLEXPEN) 100 UNIT/ML FlexPen, Inject 6-10 Units into the skin 3 (three) times daily before meals.   Insulin Glargine (LANTUS SOLOSTAR) 100 UNIT/ML Solostar Pen, INJECT 40 UNITS IN THE MORNING.   insulin lispro (HUMALOG KWIKPEN) 100 UNIT/ML KiwkPen, Inject 0.15 mLs (15 Units total) into the skin 3 (three) times daily.   JANUVIA 100 MG tablet, TAKE 1 TABLET (100 MG TOTAL) BY MOUTH DAILY.   metFORMIN (GLUCOPHAGE-XR) 500 MG 24 hr tablet, TAKE 2 TABLETS BY MOUTH 2 TIMES DAILY WITH A MEAL.   predniSONE (DELTASONE) 20 MG tablet, Take 1 tablet (20 mg total) by mouth daily with breakfast.  Current Outpatient Medications (Cardiovascular):    atenolol (TENORMIN) 25 MG tablet, Take 1 tablet (25 mg total) by mouth daily. X 7 days, then increase to 2 tabs (50mg ) daily   rosuvastatin (CRESTOR) 40 MG tablet, TAKE 1 TABLET (40  MG TOTAL) BY MOUTH DAILY.  Current Outpatient Medications (Respiratory):    benzonatate (TESSALON) 200 MG capsule, Take 1 capsule (200 mg total) by mouth 3 (three) times daily as needed (with full glass of water).   brompheniramine-pseudoephedrine-DM 30-2-10 MG/5ML syrup, Take 5 mLs by mouth 4 (four) times daily as needed.   Cetirizine HCl 10 MG CAPS, Take 1 capsule (10 mg total) by mouth daily for 15 days.   fluticasone (FLONASE) 50 MCG/ACT nasal spray, Place 1-2 sprays into both nostrils daily for 7 days.  Current Outpatient Medications (Analgesics):    aspirin 81 MG EC tablet, Take 81 mg by mouth daily.     butalbital-acetaminophen-caffeine (FIORICET, ESGIC) 50-325-40 MG tablet, TAKE 1 TABLET BY MOUTH EVERY 6 HOURS AS NEEDED FOR HEADACHE (MAX OF 1 OR 2 TABLETS PER DAY)   Erenumab-aooe (AIMOVIG) 140 MG/ML SOAJ, Inject 140 mg into the skin every 30 (thirty) days.   flurbiprofen (ANSAID) 100 MG tablet, Take 1 tablet (100 mg total) by mouth every 8 (eight) hours as needed. Take no more than 300mg  in 24 hours   Lasmiditan Succinate (REYVOW) 100 MG TABS, Take 100 mg by mouth as needed. No more than 1 tablet in 24 hours   Lasmiditan Succinate (REYVOW) 100 MG TABS, Take 100 mg by mouth as needed. Take 1 tablet for headache. No more than 1 tablet in 24 hours   meloxicam (MOBIC) 15 MG tablet, TAKE 1 TABLET (15 MG TOTAL) BY MOUTH DAILY.   naproxen (NAPROSYN) 500 MG tablet, TAKE 1 TABLET BY MOUTH 2 TIMES DAILY WITH A MEAL   traMADol (ULTRAM) 50 MG tablet, TAKE 1 TABLET BY MOUTH EVERY 8 HOURS AS NEEDED   Ubrogepant (UBRELVY) 100 MG TABS, Take 1 tablet by mouth every 2 (two) hours as needed (Maximum 2 tablets/24 hours).   SUMAtriptan Succinate (ZEMBRACE SYMTOUCH) 3 MG/0.5ML SOAJ, Inject 1 Device into the skin once. May repeat dose once after 1 hour if headache persists or returns (do not exceed two injections in 24 hours)   Current Outpatient Medications (Other):     amoxicillin-clavulanate (AUGMENTIN) 875-125 MG tablet, Take 1 tablet by mouth 2 (two) times daily.   Blood Glucose Monitoring Suppl (FREESTYLE LITE) DEVI, Use as directed four times daily E11.9   DENTA 5000 PLUS 1.1 % CREA dental cream,    esomeprazole (NEXIUM) 40 MG capsule, TAKE 1 CAPSULE BY MOUTH 2 TIMES DAILY BEFORE A MEAL.   estradiol (ESTRACE) 0.1 MG/GM vaginal cream,    gabapentin (NEURONTIN) 300 MG capsule, TAKE 1 CAPSULE BY MOUTH 2 TIMES DAILY.  glucose blood (FREESTYLE LITE) test strip, Use as instructed four times daily E11.9   hydrOXYzine (ATARAX/VISTARIL) 10 MG tablet, Take 1 tablet (10 mg total) by mouth 3 (three) times daily as needed.   Insulin Pen Needle (UNIFINE PENTIPS) 32G X 4 MM MISC, USE 4x a day   Lancets (FREESTYLE) lancets, Use as instructed four times daily E11.9   linaclotide (LINZESS) 145 MCG CAPS capsule, Take 1 capsule (145 mcg total) by mouth daily before breakfast.   methocarbamol (ROBAXIN) 500 MG tablet, Take 1 tablet (500 mg total) by mouth 2 (two) times daily as needed for muscle spasms.   nystatin-triamcinolone ointment (MYCOLOG),    OnabotulinumtoxinA (BOTOX IJ), Inject 1 each as directed every 3 (three) months.   ondansetron (ZOFRAN) 4 MG tablet, TAKE 1 TABLET BY MOUTH EVERY 8 HOURS AS NEEDED FOR NAUSEA OR VOMITING.   polyethylene glycol powder (GLYCOLAX/MIRALAX) 17 GM/SCOOP powder, Take 17 g by mouth 2 (two) times daily as needed.    Past medical history, social, surgical and family history all reviewed in electronic medical record.  No pertanent information unless stated regarding to the chief complaint.   Review of Systems:  No  visual changes, nausea, vomiting, diarrhea, constipation, dizziness, abdominal pain, skin rash, fevers, chills, night sweats, weight loss, swollen lymph nodes, body aches, joint swelling,  chest pain, shortness of breath, mood changes.  Positive muscle aches and headaches  Objective  Blood pressure 100/80,  pulse (!) 103, height 5\' 1"  (1.549 m), weight 146 lb (66.2 kg), SpO2 98 %.   General: No apparent distress alert and oriented x3 mood and affect normal, dressed appropriately.  HEENT: Pupils equal, extraocular movements intact  Respiratory: Patient's speak in full sentences and does not appear short of breath  Cardiovascular: No lower extremity edema, non tender, no erythema  Skin: Warm dry intact with no signs of infection or rash on extremities or on axial skeleton.  Abdomen: Soft nontender  Neuro: Cranial nerves II through XII are intact, neurovascularly intact in all extremities with 2+ DTRs and 2+ pulses.  Lymph: No lymphadenopathy of posterior or anterior cervical chain or axillae bilaterally.  Gait normal with good balance and coordination.  MSK:  Non tender with full range of motion and good stability and symmetric strength and tone of shoulders, elbows, wrist, hip, knee and ankles bilaterally.  Neck: Inspection loss of lordosis. No palpable stepoffs. Negative Spurling's maneuver. Mild limited range of motion in all planes of 5 to 10 degrees. Grip strength and sensation normal in bilateral hands Strength good C4 to T1 distribution No sensory change to C4 to T1 Negative Hoffman sign bilaterally Reflexes normal Tightness of the trapezius bilaterally  Right thumb exam does show the patient has a trigger nodule at the A2 pulley.  Severely tender to palpation.  Back Exam:  Inspection: Mild loss of lordosis Motion: Flexion 30 deg, Extension 25 deg, Side Bending to 35 deg bilaterally,  Rotation to 45 deg bilaterally  SLR laying: Negative  XSLR laying: Negative  Palpable tenderness: Tender to palpation the paraspinal musculature more in the cervical thoracic as well as the lumbosacral areas.Marland Kitchen FABER: negative. Sensory change: Gross sensation intact to all lumbar and sacral dermatomes.  Reflexes: 2+ at both patellar tendons, 2+ at achilles tendons, Babinski's downgoing.  Strength at  foot  Plantar-flexion: 5/5 Dorsi-flexion: 5/5 Eversion: 5/5 Inversion: 5/5  Leg strength  Quad: 5/5 Hamstring: 5/5 Hip flexor: 5/5 Hip abductors: 5/5  Gait unremarkable.  Procedure: Real-time Ultrasound Guided Injection of right trigger  nodule of thumb Device: GE Logiq Q7 Ultrasound guided injection is preferred based studies that show increased duration, increased effect, greater accuracy, decreased procedural pain, increased response rate, and decreased cost with ultrasound guided versus blind injection.  Verbal informed consent obtained.  Time-out conducted.  Noted no overlying erythema, induration, or other signs of local infection.  Skin prepped in a sterile fashion.  Local anesthesia: Topical Ethyl chloride.  With sterile technique and under real time ultrasound guidance: With a 25-gauge half inch needle patient was injected with 0.5 cc of 0.5% Marcaine and 0.5 cc of Kenalog 40 mg/mL Completed without difficulty  Pain immediately resolved suggesting accurate placement of the medication.  Advised to call if fevers/chills, erythema, induration, drainage, or persistent bleeding.  Images permanently stored and available for review in the ultrasound unit.  Impression: Technically successful ultrasound guided injection.  Osteopathic findings C2 flexed rotated and side bent right C6 flexed rotated and side bent left T3 extended rotated and side bent right inhaled third rib T9 extended rotated and side bent left L2 flexed rotated and side bent right Sacrum right on right    Impression and Recommendations:     This case required medical decision making of moderate complexity. The above documentation has been reviewed and is accurate and complete Lyndal Pulley, DO       Note: This dictation was prepared with Dragon dictation along with smaller phrase technology. Any transcriptional errors that result from this process are unintentional.

## 2018-11-22 NOTE — Progress Notes (Signed)
[name] prior authorization submitted and waiting response via covermymeds.

## 2018-11-22 NOTE — Assessment & Plan Note (Addendum)
Decision today to treat with OMT was based on Physical Exam  After verbal consent patient was treated with HVLA, ME, FPR techniques in cervical, thoracic, rib,  lumbar and sacral areas  Patient tolerated the procedure well with improvement in symptoms  Patient given exercises, stretches and lifestyle modifications  See medications in patient instructions if given  Patient will follow up in 4-8 weeks 

## 2018-11-22 NOTE — Assessment & Plan Note (Signed)
Cervicogenic headaches.  Has been doing relatively well though.  Discussed which activities to doing which wants to avoid.  Patient is to increase activity slowly over the course the next several weeks.  Discussed icing regimen and home exercises.  Patient will increase activity slowly.  Follow-up again in 4 to 8 weeks

## 2018-11-22 NOTE — Patient Instructions (Signed)
Good to see you.  Thumb will get better in about 2 weeks Keep up the exercises See me again in 1-2 months

## 2018-11-24 NOTE — Progress Notes (Signed)
Aimovig approved via covermymeds.

## 2018-11-24 NOTE — Progress Notes (Signed)
Rachel [name] approved via covermymeds.

## 2019-01-03 ENCOUNTER — Ambulatory Visit: Payer: No Typology Code available for payment source | Admitting: Family Medicine

## 2019-01-03 ENCOUNTER — Other Ambulatory Visit: Payer: Self-pay

## 2019-01-03 ENCOUNTER — Encounter: Payer: Self-pay | Admitting: Family Medicine

## 2019-01-03 VITALS — BP 110/70 | HR 90 | Ht 61.0 in | Wt 148.0 lb

## 2019-01-03 DIAGNOSIS — M999 Biomechanical lesion, unspecified: Secondary | ICD-10-CM | POA: Diagnosis not present

## 2019-01-03 DIAGNOSIS — M545 Low back pain, unspecified: Secondary | ICD-10-CM

## 2019-01-03 DIAGNOSIS — G8929 Other chronic pain: Secondary | ICD-10-CM | POA: Diagnosis not present

## 2019-01-03 MED ORDER — KETOROLAC TROMETHAMINE 60 MG/2ML IM SOLN
60.0000 mg | Freq: Once | INTRAMUSCULAR | Status: AC
  Administered 2019-01-03: 60 mg via INTRAMUSCULAR

## 2019-01-03 MED ORDER — METHYLPREDNISOLONE ACETATE 80 MG/ML IJ SUSP
80.0000 mg | Freq: Once | INTRAMUSCULAR | Status: AC
  Administered 2019-01-03: 80 mg via INTRAMUSCULAR

## 2019-01-03 NOTE — Patient Instructions (Signed)
Good to see you  Ice is your friend 2 injections today  See me again in 4 weeks

## 2019-01-03 NOTE — Progress Notes (Signed)
[name] [address] [place], [place] [address] Phone: [phone] Subjective:   I [name] am serving as a Education administrator for Dr. Hulan Saas.  I'm seeing this patient by the request  of:    CC: Neck and back pain follow-up  BJY:[id number]   11/22/2018 Injection given today.  Tolerated the procedure well.  Bracing at night.  Discussed 2 weeks until a significant improvement.  Follow-up again in 4 to 6 weeks  01/03/2019 Rachel [name] is a 53 y.o. female coming in with complaint of thumb and back pain. Lower back is bothering her.  Patient at work did attempt to catch a fairly large individual.  When this happened had pain and an ache immediately.  Since then has unfortunately had some more tightness than usual.  Rates the severity of pain is 6 out of 10.     Past Medical History:  Diagnosis Date  . ALLERGIC RHINITIS 10/04/2007  . Anemia 01/21/2011  . ASTHMA 08/02/2007   INHALERS ONLY IN [place]  . Blood transfusion without reported diagnosis    with first child   . COMMON MIGRAINE 05/15/2009  . DIABETES MELLITUS, TYPE II 08/02/2007  . GERD 08/02/2007  . HYPERLIPIDEMIA 08/02/2007  . INSOMNIA-SLEEP DISORDER-UNSPEC 01/04/2008  . INTERMITTENT VERTIGO 05/15/2009  . LIBIDO, DECREASED 01/09/2010   Past Surgical History:  Procedure Laterality Date  . CESAREAN SECTION     x 3  . CYST EXCISION     left knee  . PAROTIDECTOMY  10/21/2011   Procedure: PAROTIDECTOMY;  Surgeon: Melida Quitter, MD;  Location: [facility];  Service: ENT;  Laterality: Left;   Social History   Socioeconomic History  . Marital status: Married    Spouse name: [name]  . Number of children: 3  . Years of education: Degree  . Highest education level: Not on file  Occupational History  . Occupation: Research scientist (life sciences): [employer]  . Occupation: Chartered certified accountant    Employer: [employer]  . Financial resource strain: Not on file  . Food insecurity    Worry: Not  on file    Inability: Not on file  . Transportation needs    Medical: Not on file    Non-medical: Not on file  Tobacco Use  . Smoking status: Never Smoker  . Smokeless tobacco: Never Used  Substance and Sexual Activity  . Alcohol use: No    Alcohol/week: 0.0 standard drinks  . Drug use: No  . Sexual activity: Yes  Lifestyle  . Physical activity    Days per week: Not on file    Minutes per session: Not on file  . Stress: Not on file  Relationships  . Social Herbalist on phone: Not on file    Gets together: Not on file    Attends religious service: Not on file    Active member of club or organization: Not on file    Attends meetings of clubs or organizations: Not on file    Relationship status: Not on file  Other Topics Concern  . Not on file  Social History Narrative   She has 3 daughters.   Patient has a 4 year degree.    Patient working at [employer].    Patient is married to [place].   Allergies  Allergen Reactions  . Lipitor [Atorvastatin]     REACTION: myalgias  . Nitroglycerin    Family History  Problem Relation Age of Onset  . Hypertension Father   . Diabetes Father   . Asthma Father   . Cervical cancer Maternal Aunt   . Heart disease Maternal Aunt   . Anesthesia problems Neg Hx   . Colon cancer Neg Hx     Current Outpatient Medications (Endocrine & Metabolic):  .  insulin aspart (NOVOLOG FLEXPEN) 100 UNIT/ML FlexPen, Inject 6-10 Units into the skin 3 (three) times daily before meals. .  Insulin Glargine (LANTUS SOLOSTAR) 100 UNIT/ML Solostar Pen, INJECT 40 UNITS IN THE MORNING. .  insulin lispro (HUMALOG KWIKPEN) 100 UNIT/ML KiwkPen, Inject 0.15 mLs (15 Units total) into the skin 3 (three) times daily. Marland Kitchen  JANUVIA 100 MG tablet, TAKE 1 TABLET (100 MG TOTAL) BY MOUTH DAILY. .  metFORMIN (GLUCOPHAGE-XR) 500 MG 24 hr tablet, TAKE 2 TABLETS BY MOUTH 2 TIMES DAILY WITH A MEAL. .  predniSONE (DELTASONE) 20 MG tablet, Take 1 tablet (20 mg total) by  mouth daily with breakfast.  Current Outpatient Medications (Cardiovascular):  .  atenolol (TENORMIN) 25 MG tablet, Take 1 tablet (25 mg total) by mouth daily. X 7 days, then increase to 2 tabs (50mg ) daily .  rosuvastatin (CRESTOR) 40 MG tablet, TAKE 1 TABLET (40 MG TOTAL) BY MOUTH DAILY.  Current Outpatient Medications (Respiratory):  .  benzonatate (TESSALON) 200 MG capsule, Take 1 capsule (200 mg total) by mouth 3 (three) times daily as needed (with full glass of water). .  brompheniramine-pseudoephedrine-DM 30-2-10 MG/5ML syrup, Take 5 mLs by mouth 4 (four) times daily as needed. .  Cetirizine HCl 10 MG CAPS, Take 1 capsule (10 mg total) by mouth daily for 15 days. .  fluticasone (FLONASE) 50 MCG/ACT nasal spray, Place 1-2 sprays into both nostrils daily for 7 days.  Current Outpatient Medications (Analgesics):  .  aspirin 81 MG EC tablet, Take 81 mg by mouth daily.   .  butalbital-acetaminophen-caffeine (FIORICET, ESGIC) 50-325-40 MG tablet, TAKE 1 TABLET BY MOUTH EVERY 6 HOURS AS NEEDED FOR HEADACHE (MAX OF 1 OR 2 TABLETS PER DAY) .  Erenumab-aooe (AIMOVIG) 140 MG/ML SOAJ, Inject 140 mg into the skin every 30 (thirty) days. .  flurbiprofen (ANSAID) 100 MG tablet, Take 1 tablet (100 mg total) by mouth every 8 (eight) hours as needed. Take no more than 300mg  in 24 hours .  Lasmiditan Succinate (REYVOW) 100 MG TABS, Take 100 mg by mouth as needed. No more than 1 tablet in 24 hours .  Lasmiditan Succinate (REYVOW) 100 MG TABS, Take 100 mg by mouth as needed. Take 1 tablet for headache. No more than 1 tablet in 24 hours .  meloxicam (MOBIC) 15 MG tablet, TAKE 1 TABLET (15 MG TOTAL) BY MOUTH DAILY. .  naproxen (NAPROSYN) 500 MG tablet, TAKE 1 TABLET BY MOUTH 2 TIMES DAILY WITH A MEAL .  traMADol (ULTRAM) 50 MG tablet, TAKE 1 TABLET BY MOUTH EVERY 8 HOURS AS NEEDED .  Ubrogepant (UBRELVY) 100 MG TABS, Take 1 tablet by mouth every 2 (two) hours as needed (Maximum 2 tablets/24 hours). .   SUMAtriptan Succinate (ZEMBRACE SYMTOUCH) 3 MG/0.5ML SOAJ, Inject 1 Device into the skin once. May repeat dose once after 1 hour if headache persists or returns (do not exceed two injections in 24 hours)   Current Outpatient Medications (Other):  .  amoxicillin-clavulanate (AUGMENTIN) 875-125 MG tablet, Take 1 tablet by mouth 2 (two) times daily. .  Blood Glucose Monitoring Suppl (FREESTYLE LITE) DEVI, Use as directed four times daily E11.9 .  DENTA 5000 PLUS 1.1 % CREA dental cream,  .  esomeprazole (NEXIUM) 40 MG capsule, TAKE 1 CAPSULE BY MOUTH 2 TIMES DAILY BEFORE A MEAL. Marland Kitchen  estradiol (ESTRACE) 0.1 MG/GM vaginal cream,  .  gabapentin (NEURONTIN) 300 MG capsule, TAKE 1 CAPSULE BY MOUTH 2 TIMES DAILY. Marland Kitchen  glucose blood (FREESTYLE LITE) test strip, Use as instructed four times daily E11.9 .  hydrOXYzine (ATARAX/VISTARIL) 10 MG tablet, Take 1 tablet (10 mg total) by mouth 3 (three) times daily as needed. .  Insulin Pen Needle (UNIFINE PENTIPS) 32G X 4 MM MISC, USE 4x a day .  Lancets (FREESTYLE) lancets, Use as instructed four times daily E11.9 .  linaclotide (LINZESS) 145 MCG CAPS capsule, Take 1 capsule (145 mcg total) by mouth daily before breakfast. .  methocarbamol (ROBAXIN) 500 MG tablet, Take 1 tablet (500 mg total) by mouth 2 (two) times daily as needed for muscle spasms. Marland Kitchen  nystatin-triamcinolone ointment (MYCOLOG),  .  OnabotulinumtoxinA (BOTOX IJ), Inject 1 each as directed every 3 (three) months. .  ondansetron (ZOFRAN) 4 MG tablet, TAKE 1 TABLET BY MOUTH EVERY 8 HOURS AS NEEDED FOR NAUSEA OR VOMITING. .  polyethylene glycol powder (GLYCOLAX/MIRALAX) 17 GM/SCOOP powder, Take 17 g by mouth 2 (two) times daily as needed.    Past medical history, social, surgical and family history all reviewed in electronic medical record.  No pertanent information unless stated regarding to the chief complaint.   Review of Systems:  No , visual changes, nausea, vomiting, diarrhea, constipation,  dizziness, abdominal pain, skin rash, fevers, chills, night sweats, weight loss, swollen lymph nodes, , chest pain, shortness of breath, mood changes.  Positive muscle aches, headaches, body aches  Objective  Blood pressure 110/70, pulse 90, height 5\' 1"  (1.549 m), weight 148 lb (67.1 kg), SpO2 98 %.    General: No apparent distress alert and oriented x3 mood and affect normal, dressed appropriately.  HEENT: Pupils equal, extraocular movements intact  Respiratory: Patient's speak in full sentences and does not appear short of breath  Cardiovascular: No lower extremity edema, non tender, no erythema  Skin: Warm dry intact with no signs of infection or rash on extremities or on axial skeleton.  Abdomen: Soft nontender  Neuro: Cranial nerves II through XII are intact, neurovascularly intact in all extremities with 2+ DTRs and 2+ pulses.  Lymph: No lymphadenopathy of posterior or anterior cervical chain or axillae bilaterally.  Gait normal with good balance and coordination.  MSK:  Non tender with full range of motion and good stability and symmetric strength and tone of shoulders, elbows, wrist, hip, knee and ankles bilaterally.  Neck: Inspection loss of lordosis. No palpable stepoffs. Negative Spurling's maneuver. Limited range of motion in all planes lacking the last 5 degrees of sidebending in the last 10 degrees of extension Grip strength and sensation normal in bilateral hands Strength good C4 to T1 distribution No sensory change to C4 to T1 Negative Hoffman sign bilaterally Reflexes normal  Back Exam:  Inspection: Unremarkable  Motion: Flexion 45 deg, Extension 25 deg, Side Bending to 35 deg bilaterally,  Rotation to 35 deg bilaterally  SLR laying: Negative  XSLR laying: Negative  Palpable tenderness: Tender to palpation paraspinal musculature diffusely. FABER: Tightness bilaterally. Sensory change: Gross sensation intact to all lumbar and sacral dermatomes.  Reflexes: 2+ at  both patellar tendons, 2+ at achilles tendons, Babinski's downgoing.  Strength at foot  Plantar-flexion: 5/5 Dorsi-flexion: 5/5 Eversion: 5/5 Inversion: 5/5  Leg strength  Quad: 5/5  Hamstring: 5/5 Hip flexor: 5/5 Hip abductors: 5/5  Gait unremarkable.  Osteopathic findings C2 flexed rotated and side bent right C7 flexed rotated and side bent left T3 extended rotated and side bent right inhaled third rib T7 extended rotated and side bent left L4 flexed rotated and side bent right Sacrum right on right    Impression and Recommendations:     This case required medical decision making of moderate complexity. The above documentation has been reviewed and is accurate and complete Lyndal Pulley, DO       Note: This dictation was prepared with Dragon dictation along with smaller phrase technology. Any transcriptional errors that result from this process are unintentional.

## 2019-01-03 NOTE — Assessment & Plan Note (Signed)
Tightness noted.  Patient did have an incident at work.  We discussed with patient in great length.  Toradol and Depo-Medrol given today to help with some of the pain relief.  We discussed proper lifting mechanics.  Patient wants to continue to work at this time.  Follow-up with me again in 4 to 8 weeks

## 2019-01-03 NOTE — Assessment & Plan Note (Signed)
Decision today to treat with OMT was based on Physical Exam  After verbal consent patient was treated with HVLA, ME, FPR techniques in cervical, thoracic, rib lumbar and sacral areas  Patient tolerated the procedure well with improvement in symptoms  Patient given exercises, stretches and lifestyle modifications  See medications in patient instructions if given  Patient will follow up in 4-8 weeks 

## 2019-01-03 NOTE — Assessment & Plan Note (Signed)
Multifactorial.  Patient does do a lot of overuse and repetitive motions.  Does have still poor core strength.  Encourage patient to increase activity as tolerated.  Discussed icing regimen and home exercises, discussed which activities of doing which wants to avoid.  Patient responds well to osteopathic manipulation.  Follow-up again in 4 to 6 weeks

## 2019-01-15 NOTE — Progress Notes (Unsigned)
Virtual Visit via Video Note The purpose of this virtual visit is to provide medical care while limiting exposure to the novel coronavirus.    Consent was obtained for video visit:  {yes no:[id number]} Answered questions that patient had about telehealth interaction:  {yes no:314532} I discussed the limitations, risks, security and privacy concerns of performing an evaluation and management service by telemedicine. I also discussed with the patient that there may be a patient responsible charge related to this service. The patient expressed understanding and agreed to proceed.  Pt location: Home Physician Location: office Name of referring provider:  Biagio Borg, MD I connected with Rachel [name] at patients initiation/request on 01/17/2019 at 10:10 AM EDT by video enabled telemedicine application and verified that I am speaking with the correct person using two identifiers. Pt MRN:  [id number] Pt DOB:  1965/10/06 Video Participants:  Rachel [name]   History of Present Illness:  Rachel [name] a 53 year old right-handed female with type 2 diabetes, hyperlipidemia, GERD, asthma, and migraine who follows up for migraine.  UPDATERoselyn [name]?  Reyvow? Intensity:  *** Duration:  *** Frequency:  *** Current NSAIDS:naproxen 500mg  Current analgesics:Tramadol (for back pain, ineffective for headache) Current triptans:None Current ergotamine:None Current anti-emetic:Zofran ODT 8 mg Current muscle relaxants:Robaxin Current anti-anxiolytic:None Current sleep aide:None Current Antihypertensive medications:None Current Antidepressant medications:None Current Anticonvulsant medications:Gabapentin 300 mg twice daily (for back pain) Current anti-CGRP:Aimovig 140 mg Current Vitamins/Herbal/Supplements:Turmeric Current Antihistamines/Decongestants:None Other therapy:OMM  Caffeine:One cup of coffee daily Diet:Drinks plenty of water. No soda. Exercise:No  Depression:No; Anxiety:yes. Her mother has been ill. She has been confused.  Other pain:Knee pain. Sleep hygiene:Poor. Worrying about her mother.  HISTORY: Onset:2011.She does have remote history of headaches when she was younger. Location:Back of head and radiates to the front Quality:Shooting up from back of head, pounding on top and front of head Initial Intensity:8/10 Aura:no Prodrome:no Associated symptoms:Nausea, photophobia, phonophobia, blurred vision (she gets annual eye exams due to diabetes) Initial Duration:All day Initial Frequency:Almost daily (cannot function 6 days per month) Triggers/exacerbating factors:wine Relieving factors:No Activity:Cannot work about 6 days per month (she works as an Advertising account planner)  Past NSAIDS:Cambia (made headache worse), flurbiprofen, Sprix nasal spray, ibuprofen Past analgesics:Fioricet, Excedrin, Tylenol Past abortive triptans:Sumatriptan 100 mg, Zembrace-SymTouch (did not like the feeling), Zomig 5 mg NS (did not like the way it made her feel), Maxalt (dizziness, ineffective) Past abortive ergotamine:Cafergot Past muscle relaxants:None Past anti-emetic:None Past antihypertensive medications:Atenolol Past antidepressant medications:Venlafaxine Exar 150 mg Past anticonvulsant medications:Topiramate, Depakote Past anti-CGRP:None Past vitamins/Herbal/Supplements:None Past antihistamines/decongestants:None Other past therapies:Botox  Frequent Falls: For the past few years, she has had falls, but they have become more frequent over the past year. Previously, she was diagnosed with vertigo. However, she reports that she doesn't note spinning or lightheadedness. She says she "just falls". On one occasion, she was pushing the seat forward while sitting and just fell over. Otherwise, it always occurs while walking or on her feet. She denies double vision or focal numbness or  weakness. MRI of the brain with seizure protocol was performed on 08/02/11, which was unremarkable. Sleep deprived EEG was normal. She has had PT, which was ineffective. She does report that she sometimes trips while walking but doesn't fall. Cervical plain films from 05/07/15 showed mild degenerative disc disease at C5-6, but otherwise unremarkable. Lumbar films from 06/10/15 were personally reviewed and were negative. She underwent anothner MRI of brain with and without contrast on 02/10/16, and was stable compared to prior MRI from 2013.  NCV-EMG from 05/13/16 was negative for neuropathy or lumbosacral radiculopathy.  Past Medical History: Past Medical History:  Diagnosis Date  . ALLERGIC RHINITIS 10/04/2007  . Anemia 01/21/2011  . ASTHMA 08/02/2007   INHALERS ONLY IN [place]  . Blood transfusion without reported diagnosis    with first child   . COMMON MIGRAINE 05/15/2009  . DIABETES MELLITUS, TYPE II 08/02/2007  . GERD 08/02/2007  . HYPERLIPIDEMIA 08/02/2007  . INSOMNIA-SLEEP DISORDER-UNSPEC 01/04/2008  . INTERMITTENT VERTIGO 05/15/2009  . LIBIDO, DECREASED 01/09/2010    Medications: Outpatient Encounter Medications as of 01/17/2019  Medication Sig  . amoxicillin-clavulanate (AUGMENTIN) 875-125 MG tablet Take 1 tablet by mouth 2 (two) times daily.  Marland Kitchen aspirin 81 MG EC tablet Take 81 mg by mouth daily.    Marland Kitchen atenolol (TENORMIN) 25 MG tablet Take 1 tablet (25 mg total) by mouth daily. X 7 days, then increase to 2 tabs (50mg ) daily  . benzonatate (TESSALON) 200 MG capsule Take 1 capsule (200 mg total) by mouth 3 (three) times daily as needed (with full glass of water).  . Blood Glucose Monitoring Suppl (FREESTYLE LITE) DEVI Use as directed four times daily E11.9  . brompheniramine-pseudoephedrine-DM 30-2-10 MG/5ML syrup Take 5 mLs by mouth 4 (four) times daily as needed.  . butalbital-acetaminophen-caffeine (FIORICET, ESGIC) 50-325-40 MG tablet TAKE 1 TABLET BY MOUTH EVERY 6 HOURS AS NEEDED FOR  HEADACHE (MAX OF 1 OR 2 TABLETS PER DAY)  . Cetirizine HCl 10 MG CAPS Take 1 capsule (10 mg total) by mouth daily for 15 days.  . DENTA 5000 PLUS 1.1 % CREA dental cream   . Erenumab-aooe (AIMOVIG) 140 MG/ML SOAJ Inject 140 mg into the skin every 30 (thirty) days.  Marland Kitchen esomeprazole (NEXIUM) 40 MG capsule TAKE 1 CAPSULE BY MOUTH 2 TIMES DAILY BEFORE A MEAL.  Marland Kitchen estradiol (ESTRACE) 0.1 MG/GM vaginal cream   . flurbiprofen (ANSAID) 100 MG tablet Take 1 tablet (100 mg total) by mouth every 8 (eight) hours as needed. Take no more than 300mg  in 24 hours  . fluticasone (FLONASE) 50 MCG/ACT nasal spray Place 1-2 sprays into both nostrils daily for 7 days.  Marland Kitchen gabapentin (NEURONTIN) 300 MG capsule TAKE 1 CAPSULE BY MOUTH 2 TIMES DAILY.  Marland Kitchen glucose blood (FREESTYLE LITE) test strip Use as instructed four times daily E11.9  . hydrOXYzine (ATARAX/VISTARIL) 10 MG tablet Take 1 tablet (10 mg total) by mouth 3 (three) times daily as needed.  . insulin aspart (NOVOLOG FLEXPEN) 100 UNIT/ML FlexPen Inject 6-10 Units into the skin 3 (three) times daily before meals.  . Insulin Glargine (LANTUS SOLOSTAR) 100 UNIT/ML Solostar Pen INJECT 40 UNITS IN THE MORNING.  . insulin lispro (HUMALOG KWIKPEN) 100 UNIT/ML KiwkPen Inject 0.15 mLs (15 Units total) into the skin 3 (three) times daily.  . Insulin Pen Needle (UNIFINE PENTIPS) 32G X 4 MM MISC USE 4x a day  . JANUVIA 100 MG tablet TAKE 1 TABLET (100 MG TOTAL) BY MOUTH DAILY.  Marland Kitchen Lancets (FREESTYLE) lancets Use as instructed four times daily E11.9  . Lasmiditan Succinate (REYVOW) 100 MG TABS Take 100 mg by mouth as needed. No more than 1 tablet in 24 hours  . Lasmiditan Succinate (REYVOW) 100 MG TABS Take 100 mg by mouth as needed. Take 1 tablet for headache. No more than 1 tablet in 24 hours  . linaclotide (LINZESS) 145 MCG CAPS capsule Take 1 capsule (145 mcg total) by mouth daily before breakfast.  . meloxicam (MOBIC) 15 MG tablet TAKE 1 TABLET (  15 MG TOTAL) BY MOUTH DAILY.   . metFORMIN (GLUCOPHAGE-XR) 500 MG 24 hr tablet TAKE 2 TABLETS BY MOUTH 2 TIMES DAILY WITH A MEAL.  . methocarbamol (ROBAXIN) 500 MG tablet Take 1 tablet (500 mg total) by mouth 2 (two) times daily as needed for muscle spasms.  . naproxen (NAPROSYN) 500 MG tablet TAKE 1 TABLET BY MOUTH 2 TIMES DAILY WITH A MEAL  . nystatin-triamcinolone ointment (MYCOLOG)   . OnabotulinumtoxinA (BOTOX IJ) Inject 1 each as directed every 3 (three) months.  . ondansetron (ZOFRAN) 4 MG tablet TAKE 1 TABLET BY MOUTH EVERY 8 HOURS AS NEEDED FOR NAUSEA OR VOMITING.  . polyethylene glycol powder (GLYCOLAX/MIRALAX) 17 GM/SCOOP powder Take 17 g by mouth 2 (two) times daily as needed.  . predniSONE (DELTASONE) 20 MG tablet Take 1 tablet (20 mg total) by mouth daily with breakfast.  . rosuvastatin (CRESTOR) 40 MG tablet TAKE 1 TABLET (40 MG TOTAL) BY MOUTH DAILY.  . SUMAtriptan Succinate (ZEMBRACE SYMTOUCH) 3 MG/0.5ML SOAJ Inject 1 Device into the skin once. May repeat dose once after 1 hour if headache persists or returns (do not exceed two injections in 24 hours)  . traMADol (ULTRAM) 50 MG tablet TAKE 1 TABLET BY MOUTH EVERY 8 HOURS AS NEEDED  . Ubrogepant (UBRELVY) 100 MG TABS Take 1 tablet by mouth every 2 (two) hours as needed (Maximum 2 tablets/24 hours).   No facility-administered encounter medications on file as of 01/17/2019.     Allergies: Allergies  Allergen Reactions  . Lipitor [Atorvastatin]     REACTION: myalgias  . Nitroglycerin     Family History: Family History  Problem Relation Age of Onset  . Hypertension Father   . Diabetes Father   . Asthma Father   . Cervical cancer Maternal Aunt   . Heart disease Maternal Aunt   . Anesthesia problems Neg Hx   . Colon cancer Neg Hx     Social History: Social History   Socioeconomic History  . Marital status: Married    Spouse name: [name]  . Number of children: 3  . Years of education: Degree  . Highest education level: Not on file  Occupational  History  . Occupation: Research scientist (life sciences): [employer]  . Occupation: Chartered certified accountant    Employer: [employer]  . Financial resource strain: Not on file  . Food insecurity    Worry: Not on file    Inability: Not on file  . Transportation needs    Medical: Not on file    Non-medical: Not on file  Tobacco Use  . Smoking status: Never Smoker  . Smokeless tobacco: Never Used  Substance and Sexual Activity  . Alcohol use: No    Alcohol/week: 0.0 standard drinks  . Drug use: No  . Sexual activity: Yes  Lifestyle  . Physical activity    Days per week: Not on file    Minutes per session: Not on file  . Stress: Not on file  Relationships  . Social Herbalist on phone: Not on file    Gets together: Not on file    Attends religious service: Not on file    Active member of club or organization: Not on file    Attends meetings of clubs or organizations: Not on file    Relationship status: Not on file  . Intimate partner violence    Fear of current or ex partner: Not on file  Emotionally abused: Not on file    Physically abused: Not on file    Forced sexual activity: Not on file  Other Topics Concern  . Not on file  Social History Narrative   She has 3 daughters.   Patient has a 4 year degree.    Patient working at [employer].    Patient is married to [place].    Observations/Objective:   *** No acute distress.  Alert and oriented.  Speech fluent and not dysarthric.  Language intact.  Eyes orthophoric on primary gaze.  Face symmetric.  Assessment and Plan:   ***  Follow Up Instructions:    -I discussed the assessment and treatment plan with the patient. The patient was provided an opportunity to ask questions and all were answered. The patient agreed with the plan and demonstrated an understanding of the instructions.   The patient was advised to call back or seek an in-person evaluation if the symptoms worsen or if the condition fails  to improve as anticipated.    Total Time spent in visit with the patient was:  ***, of which more than 50% of the time was spent in counseling and/or coordinating care on ***.   Pt understands and agrees with the plan of care outlined.     Dudley Major, DO

## 2019-01-17 ENCOUNTER — Other Ambulatory Visit: Payer: Self-pay

## 2019-01-17 ENCOUNTER — Telehealth: Payer: No Typology Code available for payment source | Admitting: Neurology

## 2019-01-24 ENCOUNTER — Ambulatory Visit (INDEPENDENT_AMBULATORY_CARE_PROVIDER_SITE_OTHER): Payer: No Typology Code available for payment source | Admitting: Internal Medicine

## 2019-01-24 ENCOUNTER — Encounter: Payer: Self-pay | Admitting: Internal Medicine

## 2019-01-24 ENCOUNTER — Other Ambulatory Visit: Payer: Self-pay

## 2019-01-24 VITALS — BP 112/76 | HR 88 | Temp 98.4°F | Ht 61.0 in | Wt 150.0 lb

## 2019-01-24 DIAGNOSIS — E1165 Type 2 diabetes mellitus with hyperglycemia: Secondary | ICD-10-CM

## 2019-01-24 DIAGNOSIS — R112 Nausea with vomiting, unspecified: Secondary | ICD-10-CM

## 2019-01-24 DIAGNOSIS — Z794 Long term (current) use of insulin: Secondary | ICD-10-CM

## 2019-01-24 DIAGNOSIS — J4520 Mild intermittent asthma, uncomplicated: Secondary | ICD-10-CM | POA: Diagnosis not present

## 2019-01-24 DIAGNOSIS — E1169 Type 2 diabetes mellitus with other specified complication: Secondary | ICD-10-CM | POA: Diagnosis not present

## 2019-01-24 MED ORDER — METOCLOPRAMIDE HCL 5 MG PO TABS: 5.0000 mg | ORAL_TABLET | Freq: Three times a day (TID) | ORAL | 1 refills | Status: DC | PRN

## 2019-01-24 MED ORDER — ONDANSETRON HCL 4 MG PO TABS: ORAL_TABLET | ORAL | 1 refills | Status: DC

## 2019-01-24 NOTE — Patient Instructions (Addendum)
Please take all new medication as prescribed - the reglan for vomiting  Please continue all other medications as before, and refills have been done if requested - the zofran, and including the nexium  Please have the pharmacy call with any other refills you may need.  Please continue your efforts at being more active, low cholesterol diet, and weight control.  Please keep your appointments with your specialists as you may have planned - Dr Renne Crigler for the sugars  You will be contacted regarding the referral for: Gastric Emptying Scan., and Dr Erick Alley

## 2019-01-24 NOTE — Progress Notes (Signed)
Subjective:    Patient ID: Rachel [name], female    DOB: 04-24-1966, 53 y.o.   MRN: [id number]  HPI  Here to f/u; overall doing ok,  Pt denies chest pain, increasing sob or doe, wheezing, orthopnea, PND, increased LE swelling, palpitations, dizziness or syncope.  Pt denies new neurological symptoms such as new headache, or facial or extremity weakness or numbness.  Pt denies polydipsia, polyuria, or low sugar episode.  Pt states overall good compliance with meds, mostly trying to follow appropriate diet, with wt overall stable,  but little exercise however. CBG was low at 98 this am.  Denies worsening reflux, abd pain, dysphagia, bowel change or blood, but having sudden intermittent recurrent N/V several times per day for almost 1 wk. Past Medical History:  Diagnosis Date  . ALLERGIC RHINITIS 10/04/2007  . Anemia 01/21/2011  . ASTHMA 08/02/2007   INHALERS ONLY IN [place]  . Blood transfusion without reported diagnosis    with first child   . COMMON MIGRAINE 05/15/2009  . DIABETES MELLITUS, TYPE II 08/02/2007  . GERD 08/02/2007  . HYPERLIPIDEMIA 08/02/2007  . INSOMNIA-SLEEP DISORDER-UNSPEC 01/04/2008  . INTERMITTENT VERTIGO 05/15/2009  . LIBIDO, DECREASED 01/09/2010   Past Surgical History:  Procedure Laterality Date  . CESAREAN SECTION     x 3  . CYST EXCISION     left knee  . PAROTIDECTOMY  10/21/2011   Procedure: PAROTIDECTOMY;  Surgeon: Melida Quitter, MD;  Location: [facility];  Service: ENT;  Laterality: Left;    reports that she has never smoked. She has never used smokeless tobacco. She reports that she does not drink alcohol or use drugs. family history includes Asthma in her father; Cervical cancer in her maternal aunt; Diabetes in her father; Heart disease in her maternal aunt; Hypertension in her father. Allergies  Allergen Reactions  . Lipitor [Atorvastatin]     REACTION: myalgias  . Nitroglycerin    Current Outpatient Medications on File Prior to Visit  Medication Sig Dispense  Refill  . aspirin 81 MG EC tablet Take 81 mg by mouth daily.      Marland Kitchen atenolol (TENORMIN) 25 MG tablet Take 1 tablet (25 mg total) by mouth daily. X 7 days, then increase to 2 tabs (50mg ) daily 60 tablet 3  . benzonatate (TESSALON) 200 MG capsule Take 1 capsule (200 mg total) by mouth 3 (three) times daily as needed (with full glass of water). 20 capsule 0  . Blood Glucose Monitoring Suppl (FREESTYLE LITE) DEVI Use as directed four times daily E11.9 1 each 0  . brompheniramine-pseudoephedrine-DM 30-2-10 MG/5ML syrup Take 5 mLs by mouth 4 (four) times daily as needed. 120 mL 0  . butalbital-acetaminophen-caffeine (FIORICET, ESGIC) 50-325-40 MG tablet TAKE 1 TABLET BY MOUTH EVERY 6 HOURS AS NEEDED FOR HEADACHE (MAX OF 1 OR 2 TABLETS PER DAY) 30 tablet 5  . DENTA 5000 PLUS 1.1 % CREA dental cream   3  . Erenumab-aooe (AIMOVIG) 140 MG/ML SOAJ Inject 140 mg into the skin every 30 (thirty) days. 1 pen 5  . estradiol (ESTRACE) 0.1 MG/GM vaginal cream     . flurbiprofen (ANSAID) 100 MG tablet Take 1 tablet (100 mg total) by mouth every 8 (eight) hours as needed. Take no more than 300mg  in 24 hours 30 tablet 3  . gabapentin (NEURONTIN) 300 MG capsule TAKE 1 CAPSULE BY MOUTH 2 TIMES DAILY. 60 capsule 3  . glucose blood (FREESTYLE LITE) test strip Use as instructed four times daily E11.9 400  each 12  . hydrOXYzine (ATARAX/VISTARIL) 10 MG tablet Take 1 tablet (10 mg total) by mouth 3 (three) times daily as needed. 30 tablet 0  . insulin aspart (NOVOLOG FLEXPEN) 100 UNIT/ML FlexPen Inject 6-10 Units into the skin 3 (three) times daily before meals. 15 mL 11  . Insulin Glargine (LANTUS SOLOSTAR) 100 UNIT/ML Solostar Pen INJECT 40 UNITS IN THE MORNING. 33 mL 1  . insulin lispro (HUMALOG KWIKPEN) 100 UNIT/ML KiwkPen Inject 0.15 mLs (15 Units total) into the skin 3 (three) times daily. 30 mL 5  . Insulin Pen Needle (UNIFINE PENTIPS) 32G X 4 MM MISC USE 4x a day 200 each PRN  . JANUVIA 100 MG tablet TAKE 1 TABLET (100  MG TOTAL) BY MOUTH DAILY. 90 tablet 1  . Lancets (FREESTYLE) lancets Use as instructed four times daily E11.9 400 each 12  . Lasmiditan Succinate (REYVOW) 100 MG TABS Take 100 mg by mouth as needed. No more than 1 tablet in 24 hours 4 tablet 0  . Lasmiditan Succinate (REYVOW) 100 MG TABS Take 100 mg by mouth as needed. Take 1 tablet for headache. No more than 1 tablet in 24 hours 8 tablet 3  . linaclotide (LINZESS) 145 MCG CAPS capsule Take 1 capsule (145 mcg total) by mouth daily before breakfast. 30 capsule 2  . meloxicam (MOBIC) 15 MG tablet TAKE 1 TABLET (15 MG TOTAL) BY MOUTH DAILY. 90 tablet 0  . metFORMIN (GLUCOPHAGE-XR) 500 MG 24 hr tablet TAKE 2 TABLETS BY MOUTH 2 TIMES DAILY WITH A MEAL. 360 tablet 3  . methocarbamol (ROBAXIN) 500 MG tablet Take 1 tablet (500 mg total) by mouth 2 (two) times daily as needed for muscle spasms. 30 tablet 2  . naproxen (NAPROSYN) 500 MG tablet TAKE 1 TABLET BY MOUTH 2 TIMES DAILY WITH A MEAL 60 tablet 0  . nystatin-triamcinolone ointment (MYCOLOG)     . OnabotulinumtoxinA (BOTOX IJ) Inject 1 each as directed every 3 (three) months.    . polyethylene glycol powder (GLYCOLAX/MIRALAX) 17 GM/SCOOP powder Take 17 g by mouth 2 (two) times daily as needed. 3350 g 1  . predniSONE (DELTASONE) 20 MG tablet Take 1 tablet (20 mg total) by mouth daily with breakfast. 5 tablet 0  . rosuvastatin (CRESTOR) 40 MG tablet TAKE 1 TABLET (40 MG TOTAL) BY MOUTH DAILY. 90 tablet 3  . traMADol (ULTRAM) 50 MG tablet TAKE 1 TABLET BY MOUTH EVERY 8 HOURS AS NEEDED 60 tablet 1  . Ubrogepant (UBRELVY) 100 MG TABS Take 1 tablet by mouth every 2 (two) hours as needed (Maximum 2 tablets/24 hours). 20 tablet 5  . Cetirizine HCl 10 MG CAPS Take 1 capsule (10 mg total) by mouth daily for 15 days. 15 capsule 0  . fluticasone (FLONASE) 50 MCG/ACT nasal spray Place 1-2 sprays into both nostrils daily for 7 days. 1 g 0  . SUMAtriptan Succinate (ZEMBRACE SYMTOUCH) 3 MG/0.5ML SOAJ Inject 1 Device  into the skin once. May repeat dose once after 1 hour if headache persists or returns (do not exceed two injections in 24 hours) 9 pen 0   No current facility-administered medications on file prior to visit.    Review of Systems  Constitutional: Negative for other unusual diaphoresis or sweats HENT: Negative for ear discharge or swelling Eyes: Negative for other worsening visual disturbances Respiratory: Negative for stridor or other swelling  Gastrointestinal: Negative for worsening distension or other blood Genitourinary: Negative for retention or other urinary change Musculoskeletal: Negative for other MSK  pain or swelling Skin: Negative for color change or other new lesions Neurological: Negative for worsening tremors and other numbness  Psychiatric/Behavioral: Negative for worsening agitation or other fatigue All other system neg per pt    Objective:   Physical Exam BP 112/76   Pulse 88   Temp 98.4 F (36.9 C) (Oral)   Ht 5\' 1"  (1.549 m)   Wt 150 lb (68 kg)   SpO2 98%   BMI 28.34 kg/m  VS noted,  Constitutional: Pt appears in NAD HENT: Head: NCAT.  Right Ear: External ear normal.  Left Ear: External ear normal.  Eyes: . Pupils are equal, round, and reactive to light. Conjunctivae and EOM are normal Nose: without d/c or deformity Neck: Neck supple. Gross normal ROM Cardiovascular: Normal rate and regular rhythm.   Pulmonary/Chest: Effort normal and breath sounds without rales or wheezing.  Abd:  Soft, NT, ND, + BS, no organomegaly - benign Neurological: Pt is alert. At baseline orientation, motor grossly intact Skin: Skin is warm. No rashes, other new lesions, no LE edema Psychiatric: Pt behavior is normal without agitation  No other exam findings  Lab Results  Component Value Date   WBC 7.9 11/14/2018   HGB 12.7 11/14/2018   HCT 37.8 11/14/2018   PLT 398.0 11/14/2018   GLUCOSE 247 (H) 11/14/2018   CHOL 274 (H) 08/23/2018   TRIG 322.0 (H) 08/23/2018   HDL  55.20 08/23/2018   LDLDIRECT 191.0 08/23/2018   LDLCALC 194 (H) 11/23/2017   ALT 11 11/14/2018   AST 11 11/14/2018   NA 132 (L) 11/14/2018   K 3.5 11/14/2018   CL 97 11/14/2018   CREATININE 0.67 11/14/2018   BUN 7 11/14/2018   CO2 28 11/14/2018   TSH 0.84 11/14/2018   INR 1.08 07/23/2009   HGBA1C 10.2 (H) 11/14/2018   MICROALBUR <0.7 06/25/2016       Assessment & Plan:

## 2019-01-25 ENCOUNTER — Other Ambulatory Visit: Payer: Self-pay | Admitting: Internal Medicine

## 2019-01-27 ENCOUNTER — Encounter: Payer: Self-pay | Admitting: Internal Medicine

## 2019-01-27 DIAGNOSIS — R112 Nausea with vomiting, unspecified: Secondary | ICD-10-CM | POA: Insufficient documentation

## 2019-01-27 NOTE — Assessment & Plan Note (Signed)
stable overall by history and exam, recent data reviewed with pt, and pt to continue medical treatment as before,  to f/u any worsening symptoms or concerns, for f/u endo as planned

## 2019-01-27 NOTE — Assessment & Plan Note (Signed)
Etiology unclear, ? Gastroparesis - for reglan trial, gastric empyting study, and refer GI

## 2019-01-27 NOTE — Assessment & Plan Note (Signed)
stable overall by history and exam, recent data reviewed with pt, and pt to continue medical treatment as before,  to f/u any worsening symptoms or concerns  

## 2019-01-31 ENCOUNTER — Ambulatory Visit: Payer: No Typology Code available for payment source | Admitting: Family Medicine

## 2019-01-31 ENCOUNTER — Other Ambulatory Visit: Payer: Self-pay

## 2019-01-31 ENCOUNTER — Encounter: Payer: Self-pay | Admitting: Family Medicine

## 2019-01-31 VITALS — BP 122/82 | HR 88 | Ht 61.0 in | Wt 152.0 lb

## 2019-01-31 DIAGNOSIS — G8929 Other chronic pain: Secondary | ICD-10-CM | POA: Diagnosis not present

## 2019-01-31 DIAGNOSIS — M999 Biomechanical lesion, unspecified: Secondary | ICD-10-CM | POA: Diagnosis not present

## 2019-01-31 DIAGNOSIS — M25561 Pain in right knee: Secondary | ICD-10-CM

## 2019-01-31 DIAGNOSIS — R51 Headache: Secondary | ICD-10-CM

## 2019-01-31 DIAGNOSIS — G4486 Cervicogenic headache: Secondary | ICD-10-CM

## 2019-01-31 HISTORY — DX: Pain in right knee: M25.561

## 2019-01-31 MED ORDER — TIZANIDINE HCL 4 MG PO CAPS: ORAL_CAPSULE | ORAL | 0 refills | Status: DC

## 2019-01-31 NOTE — Assessment & Plan Note (Signed)
Patient had an acute fall but did not unfortunately likely have more of a underlying patellofemoral arthritis.  Injection given today.  Discussed icing regimen and home exercises, which activities of doing which wants to avoid.  We discussed topical anti-inflammatories and massage.  Follow-up again in 4 to 8 weeks

## 2019-01-31 NOTE — Patient Instructions (Addendum)
Good to see you Rachel [name] ordered Pennsaid finger tip sized amount on most painful spot on neck and knee See me again in 4 weeks

## 2019-01-31 NOTE — Assessment & Plan Note (Signed)
Decision today to treat with OMT was based on Physical Exam  After verbal consent patient was treated with HVLA, ME, FPR techniques in cervical, thoracic, rib lumbar and sacral areas  Patient tolerated the procedure well with improvement in symptoms  Patient given exercises, stretches and lifestyle modifications  See medications in patient instructions if given  Patient will follow up in 4-6 weeks 

## 2019-01-31 NOTE — Assessment & Plan Note (Signed)
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  Discussed with patient about home exercises, icing regimen, which activities to do which wants to avoid.  Patient has underlying anxiety and I do believe depression is also contributing.  Patient did not want to make any other changes in medications were refilled patient's muscle relaxer.  Follow-up with me again in 4 to 8 weeks

## 2019-01-31 NOTE — Progress Notes (Signed)
[name] [address] [place], [place] [address] Phone: [phone] Subjective:    \  CC: Neck and back pain follow-up  BOF:BPZWCHENID  Rachel [name] is a 53 y.o. female coming in with complaint of back pain. Neck is painful today. States it feels as if someone punched her in the neck.  Pain daily. Discussed which activities which any activities       Past Medical History:  Diagnosis Date  . ALLERGIC RHINITIS 10/04/2007  . Anemia 01/21/2011  . ASTHMA 08/02/2007   INHALERS ONLY IN [place]  . Blood transfusion without reported diagnosis    with first child   . COMMON MIGRAINE 05/15/2009  . DIABETES MELLITUS, TYPE II 08/02/2007  . GERD 08/02/2007  . HYPERLIPIDEMIA 08/02/2007  . INSOMNIA-SLEEP DISORDER-UNSPEC 01/04/2008  . INTERMITTENT VERTIGO 05/15/2009  . LIBIDO, DECREASED 01/09/2010   Past Surgical History:  Procedure Laterality Date  . CESAREAN SECTION     x 3  . CYST EXCISION     left knee  . PAROTIDECTOMY  10/21/2011   Procedure: PAROTIDECTOMY;  Surgeon: Melida Quitter, MD;  Location: [facility] OR;  Service: ENT;  Laterality: Left;   Social History   Socioeconomic History  . Marital status: Married    Spouse name: [name]  . Number of children: 3  . Years of education: Degree  . Highest education level: Not on file  Occupational History  . Occupation: Research scientist (life sciences): [employer]  . Occupation: Chartered certified accountant    Employer: [employer]  . Financial resource strain: Not on file  . Food insecurity    Worry: Not on file    Inability: Not on file  . Transportation needs    Medical: Not on file    Non-medical: Not on file  Tobacco Use  . Smoking status: Never Smoker  . Smokeless tobacco: Never Used  Substance and Sexual Activity  . Alcohol use: No    Alcohol/week: 0.0 standard drinks  . Drug use: No  . Sexual activity: Yes  Lifestyle  . Physical activity    Days per week: Not on file    Minutes per session:  Not on file  . Stress: Not on file  Relationships  . Social Herbalist on phone: Not on file    Gets together: Not on file    Attends religious service: Not on file    Active member of club or organization: Not on file    Attends meetings of clubs or organizations: Not on file    Relationship status: Not on file  Other Topics Concern  . Not on file  Social History Narrative   She has 3 daughters.   Patient has a 4 year degree.    Patient working at [employer].    Patient is married to Joffre.   Allergies  Allergen Reactions  . Lipitor [Atorvastatin]     REACTION: myalgias  . Nitroglycerin    Family History  Problem Relation Age of Onset  . Hypertension Father   . Diabetes Father   . Asthma Father   . Cervical cancer Maternal Aunt   . Heart disease Maternal Aunt   . Anesthesia problems Neg Hx   . Colon cancer Neg Hx     Current Outpatient Medications (Endocrine & Metabolic):  .  insulin aspart (NOVOLOG FLEXPEN) 100 UNIT/ML FlexPen, Inject 6-10 Units into the skin 3 (three) times daily before meals. Marland Kitchen  Insulin Glargine (LANTUS SOLOSTAR) 100 UNIT/ML Solostar Pen, INJECT 40 UNITS IN THE MORNING. .  insulin lispro (HUMALOG KWIKPEN) 100 UNIT/ML KiwkPen, Inject 0.15 mLs (15 Units total) into the skin 3 (three) times daily. Marland Kitchen  JANUVIA 100 MG tablet, TAKE 1 TABLET (100 MG TOTAL) BY MOUTH DAILY. .  metFORMIN (GLUCOPHAGE-XR) 500 MG 24 hr tablet, TAKE 2 TABLETS BY MOUTH 2 TIMES DAILY WITH A MEAL. .  predniSONE (DELTASONE) 20 MG tablet, Take 1 tablet (20 mg total) by mouth daily with breakfast.  Current Outpatient Medications (Cardiovascular):  .  atenolol (TENORMIN) 25 MG tablet, Take 1 tablet (25 mg total) by mouth daily. X 7 days, then increase to 2 tabs (50mg ) daily .  rosuvastatin (CRESTOR) 40 MG tablet, TAKE 1 TABLET (40 MG TOTAL) BY MOUTH DAILY.  Current Outpatient Medications (Respiratory):  .  benzonatate (TESSALON) 200 MG capsule, Take 1 capsule (200 mg total) by  mouth 3 (three) times daily as needed (with full glass of water). .  brompheniramine-pseudoephedrine-DM 30-2-10 MG/5ML syrup, Take 5 mLs by mouth 4 (four) times daily as needed. .  Cetirizine HCl 10 MG CAPS, Take 1 capsule (10 mg total) by mouth daily for 15 days. .  fluticasone (FLONASE) 50 MCG/ACT nasal spray, Place 1-2 sprays into both nostrils daily for 7 days.  Current Outpatient Medications (Analgesics):  .  aspirin 81 MG EC tablet, Take 81 mg by mouth daily.   .  butalbital-acetaminophen-caffeine (FIORICET, ESGIC) 50-325-40 MG tablet, TAKE 1 TABLET BY MOUTH EVERY 6 HOURS AS NEEDED FOR HEADACHE (MAX OF 1 OR 2 TABLETS PER DAY) .  Erenumab-aooe (AIMOVIG) 140 MG/ML SOAJ, Inject 140 mg into the skin every 30 (thirty) days. .  flurbiprofen (ANSAID) 100 MG tablet, Take 1 tablet (100 mg total) by mouth every 8 (eight) hours as needed. Take no more than 300mg  in 24 hours .  Lasmiditan Succinate (REYVOW) 100 MG TABS, Take 100 mg by mouth as needed. No more than 1 tablet in 24 hours .  Lasmiditan Succinate (REYVOW) 100 MG TABS, Take 100 mg by mouth as needed. Take 1 tablet for headache. No more than 1 tablet in 24 hours .  meloxicam (MOBIC) 15 MG tablet, TAKE 1 TABLET (15 MG TOTAL) BY MOUTH DAILY. .  naproxen (NAPROSYN) 500 MG tablet, TAKE 1 TABLET BY MOUTH 2 TIMES DAILY WITH A MEAL .  traMADol (ULTRAM) 50 MG tablet, TAKE 1 TABLET BY MOUTH EVERY 8 HOURS AS NEEDED .  Ubrogepant (UBRELVY) 100 MG TABS, Take 1 tablet by mouth every 2 (two) hours as needed (Maximum 2 tablets/24 hours). .  SUMAtriptan Succinate (ZEMBRACE SYMTOUCH) 3 MG/0.5ML SOAJ, Inject 1 Device into the skin once. May repeat dose once after 1 hour if headache persists or returns (do not exceed two injections in 24 hours)   Current Outpatient Medications (Other):  .  Blood Glucose Monitoring Suppl (FREESTYLE LITE) DEVI, Use as directed four times daily E11.9 .  DENTA 5000 PLUS 1.1 % CREA dental cream,  .  esomeprazole (NEXIUM) 40 MG  capsule, TAKE 1 CAPSULE BY MOUTH 2 TIMES DAILY BEFORE A MEAL. Marland Kitchen  estradiol (ESTRACE) 0.1 MG/GM vaginal cream,  .  gabapentin (NEURONTIN) 300 MG capsule, TAKE 1 CAPSULE BY MOUTH 2 TIMES DAILY. Marland Kitchen  glucose blood (FREESTYLE LITE) test strip, Use as instructed four times daily E11.9 .  hydrOXYzine (ATARAX/VISTARIL) 10 MG tablet, Take 1 tablet (10 mg total) by mouth 3 (three) times daily as needed. .  Insulin Pen Needle (UNIFINE PENTIPS) 32G  X 4 MM MISC, USE 4x a day .  Lancets (FREESTYLE) lancets, Use as instructed four times daily E11.9 .  linaclotide (LINZESS) 145 MCG CAPS capsule, Take 1 capsule (145 mcg total) by mouth daily before breakfast. .  methocarbamol (ROBAXIN) 500 MG tablet, Take 1 tablet (500 mg total) by mouth 2 (two) times daily as needed for muscle spasms. .  metoCLOPramide (REGLAN) 5 MG tablet, Take 1 tablet (5 mg total) by mouth every 8 (eight) hours as needed for nausea or vomiting. .  nystatin-triamcinolone ointment (MYCOLOG),  .  OnabotulinumtoxinA (BOTOX IJ), Inject 1 each as directed every 3 (three) months. .  ondansetron (ZOFRAN) 4 MG tablet, TAKE 1 TABLET BY MOUTH EVERY 8 HOURS AS NEEDED FOR NAUSEA OR VOMITING. .  polyethylene glycol powder (GLYCOLAX/MIRALAX) 17 GM/SCOOP powder, Take 17 g by mouth 2 (two) times daily as needed. Marland Kitchen  tiZANidine (ZANAFLEX) 4 MG capsule, 1 tablet at night    Past medical history, social, surgical and family history all reviewed in electronic medical record.  No pertanent information unless stated regarding to the chief complaint.   Review of Systems:  No headache, visual changes, nausea, vomiting, diarrhea, constipation, dizziness, abdominal pain, skin rash, fevers, chills, night sweats, weight loss, swollen lymph nodes, body aches, joint swelling,chest pain, shortness of breath, mood changes. +muscle aches   Objective  Blood pressure 122/82, pulse 88, height 5\' 1"  (1.549 m), weight 152 lb (68.9 kg), SpO2 90 %.    General: No apparent  distress alert and oriented x3 mood and affect normal, dressed appropriately.  HEENT: Pupils equal, extraocular movements intact  Respiratory: Patient's speak in full sentences and does not appear short of breath  Cardiovascular: No lower extremity edema, non tender, no erythema  Skin: Warm dry intact with no signs of infection or rash on extremities or on axial skeleton.  Abdomen: Soft nontender  Neuro: Cranial nerves II through XII are intact, neurovascularly intact in all extremities with 2+ DTRs and 2+ pulses.  Lymph: No lymphadenopathy of posterior or anterior cervical chain or axillae bilaterally.  Gait  Mild antalgic   MSK:  tender with full range of motion and good stability and symmetric strength and tone of shoulders, elbows, wrist, hip, and ankles bilaterally.   Right Knee bruise noted, near full ROM, positive grind of the patella. No instability, TTP diffusely.   Neck exam has significant tightness noted as well.  Patient has pain on range of motion.  Patient does have some voluntary guarding of the neck noted.  Significant tightness of the musculature of the neck bilaterally.  Osteopathic findings  C2 flexed rotated and side bent right C7 flexed rotated and side bent left T3 extended rotated and side bent right inhaled third rib T5 extended rotated and side bent left L2 flexed rotated and side bent right Sacrum right on right  After informed written and verbal consent, patient was seated on exam table. Right knee was prepped with alcohol swab and utilizing anterolateral approach, patient's right knee space was injected with 4:1  marcaine 0.5%: Kenalog 40mg /dL. Patient tolerated the procedure well without immediate complications.   Impression and Recommendations:     This case required medical decision making of moderate complexity. The above documentation has been reviewed and is accurate and complete Lyndal Pulley, DO       Note: This dictation was prepared with  Dragon dictation along with smaller phrase technology. Any transcriptional errors that result from this process are unintentional.

## 2019-02-01 ENCOUNTER — Other Ambulatory Visit: Payer: Self-pay | Admitting: Family Medicine

## 2019-02-01 ENCOUNTER — Other Ambulatory Visit: Payer: Self-pay

## 2019-02-01 ENCOUNTER — Ambulatory Visit: Payer: Self-pay

## 2019-02-01 DIAGNOSIS — M25561 Pain in right knee: Secondary | ICD-10-CM

## 2019-02-05 ENCOUNTER — Encounter (HOSPITAL_COMMUNITY): Payer: No Typology Code available for payment source

## 2019-02-13 ENCOUNTER — Other Ambulatory Visit: Payer: Self-pay | Admitting: Neurology

## 2019-02-13 ENCOUNTER — Other Ambulatory Visit: Payer: Self-pay | Admitting: Orthopedic Surgery

## 2019-02-13 ENCOUNTER — Other Ambulatory Visit (HOSPITAL_COMMUNITY): Payer: Self-pay | Admitting: Orthopedic Surgery

## 2019-02-13 ENCOUNTER — Telehealth: Payer: Self-pay | Admitting: Neurology

## 2019-02-13 DIAGNOSIS — M25561 Pain in right knee: Secondary | ICD-10-CM

## 2019-02-13 MED ORDER — EMGALITY 120 MG/ML ~~LOC~~ SOAJ: 240.0000 mg | Freq: Once | SUBCUTANEOUS | 0 refills | Status: DC

## 2019-02-13 NOTE — Telephone Encounter (Signed)
Instead of Reyvow, did she every try the Ubrelvy for when she gets a headache (100mg .  May repeat after 2 hours if needed)?   Remember to limit use of pain relievers to no more than 2 days out of week to prevent risk of rebound or medication-overuse headache.   I would stop Aimovig and instead start Emgality.  I will send in the first dose (which requires 2 injections, but after that it is 1 injection every 30 days).

## 2019-02-13 NOTE — Progress Notes (Signed)
emgality

## 2019-02-13 NOTE — Telephone Encounter (Signed)
Called patient she was made aware of provider response and understands

## 2019-02-13 NOTE — Telephone Encounter (Signed)
Patient complaint:  Headache  1.  What is the frequency of the headaches (how many days per week or per month)?  daily 2. How long do the headaches last with treatment?  All day goes away for 1-2 hours then back 3. What abortive medication is used?  Amovig, reyvow, gabapentin .  Is it taken at the earliest onset of the headache?  yes 4. What is the preventative medication used and dose?  N/A.  How long has the patient been on current medication and dose?  7-8 months  5. How many days a week are pain relievers taken (Advil, Aleve, Motrin, Tylenol, Excedrin, Fioricet, triptan, opiate)? Excedrin for migraines PRN havent taken in within the last 2 days     [place] to leave message on voicemail if she doesn't answer

## 2019-02-13 NOTE — Telephone Encounter (Signed)
Patient is calling in about a headache for a month and she was taking the monthly injection. It is not working. She said it is every single day that she wakes up with a headache. Thanks!

## 2019-02-14 ENCOUNTER — Encounter (HOSPITAL_COMMUNITY)
Admission: RE | Admit: 2019-02-14 | Discharge: 2019-02-14 | Disposition: A | Discharge status: Home / Self Care | Payer: No Typology Code available for payment source | Source: Ambulatory Visit | Attending: Internal Medicine | Admitting: Internal Medicine

## 2019-02-14 ENCOUNTER — Encounter: Payer: Self-pay | Admitting: *Deleted

## 2019-02-14 ENCOUNTER — Other Ambulatory Visit: Payer: Self-pay

## 2019-02-14 DIAGNOSIS — R112 Nausea with vomiting, unspecified: Secondary | ICD-10-CM | POA: Insufficient documentation

## 2019-02-14 MED ORDER — TECHNETIUM TC 99M SULFUR COLLOID
2.0000 | Freq: Once | INTRAVENOUS | Status: AC | PRN
  Administered 2019-02-14: 2 via ORAL

## 2019-02-14 NOTE — Progress Notes (Addendum)
Rachel [name] ([id number]) Rx #: [id number] Emgality 120MG /ML auto-injectors (migraine)   Form [employer] Medication Request Form  Plan Contact [phone] phone [phone] fax Created 1 day ago Sent to Plan 7 hours ago Determination Favorable 2 hours agoon Faxed approval received and was sent to scan into her chart Approval reference# [id number] is for loading dose It is approved for 1 fill of 231ml per 30 days effective 9/1-9/30 Then approval [id number] for 162ml/30 days is for maintenance valid 03/08/19-08/07/19

## 2019-02-15 ENCOUNTER — Ambulatory Visit (HOSPITAL_COMMUNITY)
Admission: RE | Admit: 2019-02-15 | Discharge: 2019-02-15 | Disposition: A | Discharge status: Home / Self Care | Payer: PRIVATE HEALTH INSURANCE | Source: Ambulatory Visit | Attending: Orthopedic Surgery | Admitting: Orthopedic Surgery

## 2019-02-15 DIAGNOSIS — M25561 Pain in right knee: Secondary | ICD-10-CM | POA: Diagnosis not present

## 2019-02-23 ENCOUNTER — Other Ambulatory Visit: Payer: Self-pay | Admitting: Nurse Practitioner

## 2019-02-23 DIAGNOSIS — Z8739 Personal history of other diseases of the musculoskeletal system and connective tissue: Secondary | ICD-10-CM

## 2019-02-28 ENCOUNTER — Ambulatory Visit (INDEPENDENT_AMBULATORY_CARE_PROVIDER_SITE_OTHER): Payer: No Typology Code available for payment source | Admitting: Internal Medicine

## 2019-02-28 ENCOUNTER — Encounter: Payer: Self-pay | Admitting: Internal Medicine

## 2019-02-28 ENCOUNTER — Ambulatory Visit (INDEPENDENT_AMBULATORY_CARE_PROVIDER_SITE_OTHER)
Admission: RE | Admit: 2019-02-28 | Discharge: 2019-02-28 | Disposition: A | Discharge status: Home / Self Care | Payer: No Typology Code available for payment source | Source: Ambulatory Visit | Attending: Internal Medicine | Admitting: Internal Medicine

## 2019-02-28 ENCOUNTER — Other Ambulatory Visit: Payer: Self-pay

## 2019-02-28 VITALS — BP 112/70 | HR 100 | Temp 98.0°F | Ht 61.0 in | Wt 152.0 lb

## 2019-02-28 DIAGNOSIS — M545 Low back pain, unspecified: Secondary | ICD-10-CM

## 2019-02-28 DIAGNOSIS — E1165 Type 2 diabetes mellitus with hyperglycemia: Secondary | ICD-10-CM

## 2019-02-28 DIAGNOSIS — Z23 Encounter for immunization: Secondary | ICD-10-CM

## 2019-02-28 DIAGNOSIS — E611 Iron deficiency: Secondary | ICD-10-CM

## 2019-02-28 DIAGNOSIS — M542 Cervicalgia: Secondary | ICD-10-CM | POA: Diagnosis not present

## 2019-02-28 DIAGNOSIS — J4520 Mild intermittent asthma, uncomplicated: Secondary | ICD-10-CM

## 2019-02-28 DIAGNOSIS — Z794 Long term (current) use of insulin: Secondary | ICD-10-CM

## 2019-02-28 DIAGNOSIS — E538 Deficiency of other specified B group vitamins: Secondary | ICD-10-CM

## 2019-02-28 DIAGNOSIS — Z0000 Encounter for general adult medical examination without abnormal findings: Secondary | ICD-10-CM

## 2019-02-28 DIAGNOSIS — E559 Vitamin D deficiency, unspecified: Secondary | ICD-10-CM

## 2019-02-28 DIAGNOSIS — G8929 Other chronic pain: Secondary | ICD-10-CM

## 2019-02-28 DIAGNOSIS — E785 Hyperlipidemia, unspecified: Secondary | ICD-10-CM

## 2019-02-28 MED ORDER — CYCLOBENZAPRINE HCL 5 MG PO TABS: 5.0000 mg | ORAL_TABLET | Freq: Three times a day (TID) | ORAL | 2 refills | Status: DC | PRN

## 2019-02-28 NOTE — Assessment & Plan Note (Signed)
stable overall by history and exam, recent data reviewed with pt, and pt to continue medical treatment as before,  to f/u any worsening symptoms or concerns  

## 2019-02-28 NOTE — Assessment & Plan Note (Signed)
C/w muscular pain and possibly underlying neck ddd/djd - for flexeril tid prn,  to f/u any worsening symptoms or concerns

## 2019-02-28 NOTE — Patient Instructions (Addendum)
Please take all new medication as prescribed - the muscle relaxer as needed  Please continue all other medications as before  Please have the pharmacy call with any other refills you may need.  Please continue your efforts at being more active, low cholesterol diet, and weight control.  Please keep your appointments with your specialists as you may have planned  Please go to the XRAY Department in the Basement (go straight as you get off the elevator) for the x-ray testing  You will be contacted by phone if any changes need to be made immediately.  Otherwise, you will receive a letter about your results with an explanation, but please check with MyChart first.  Please remember to sign up for MyChart if you have not done so, as this will be important to you in the future with finding out test results, communicating by private email, and scheduling acute appointments online when needed.  Please return in 6 months, or sooner if needed, with Lab testing done 3-5 days before

## 2019-02-28 NOTE — Progress Notes (Signed)
Subjective:    Patient ID: Rachel [name], female    DOB: [birth year], 53 y.o.   MRN: [id number]  HPI  Here to f/u, c/o 2 wks acute onset mild to mod bilateral midback and upper neck and head pain, constant, no change in position seems to make any better, pain med not making any better, has f/u appt with sports medicine in 2 wks, not taking the zanaflex as it seemed to make her reflux worse.  Sugars have been improved, CBGs 125, does not want to f/u with endo.  Recent gastric empyting study. Plans to cancel her GI appt, somewhat due to cost, and symptoms improved.   Pt denies fever, wt loss, night sweats, loss of appetite, or other constitutional symptoms  Pt denies chest pain, increased sob or doe, wheezing, orthopnea, PND, increased LE swelling, palpitations, dizziness or syncope.  Pt denies new neurological symptoms such as new headache, or facial or extremity weakness or numbness  Pt denies polydipsia, polyuria Past Medical History:  Diagnosis Date  . ALLERGIC RHINITIS 10/04/2007  . Anemia 01/21/2011  . ASTHMA 08/02/2007   INHALERS ONLY IN [place]  . Blood transfusion without reported diagnosis    with first child   . COMMON MIGRAINE 05/15/2009  . DIABETES MELLITUS, TYPE II 08/02/2007  . GERD 08/02/2007  . HYPERLIPIDEMIA 08/02/2007  . INSOMNIA-SLEEP DISORDER-UNSPEC 01/04/2008  . INTERMITTENT VERTIGO 05/15/2009  . LIBIDO, DECREASED 01/09/2010   Past Surgical History:  Procedure Laterality Date  . CESAREAN SECTION     x 3  . CYST EXCISION     left knee  . PAROTIDECTOMY  10/21/2011   Procedure: PAROTIDECTOMY;  Surgeon: Melida Quitter, MD;  Location: [facility] [facility];  Service: ENT;  Laterality: Left;    reports that she has never smoked. She has never used smokeless tobacco. She reports that she does not drink alcohol or use drugs. family history includes Asthma in her father; Cervical cancer in her maternal aunt; Diabetes in her father; Heart disease in her maternal aunt; Hypertension in her father.  Allergies  Allergen Reactions  . Lipitor [Atorvastatin]     REACTION: myalgias  . Nitroglycerin    Current Outpatient Medications on File Prior to Visit  Medication Sig Dispense Refill  . aspirin 81 MG EC tablet Take 81 mg by mouth daily.      Marland Kitchen atenolol (TENORMIN) 25 MG tablet Take 1 tablet (25 mg total) by mouth daily. X 7 days, then increase to 2 tabs (50mg ) daily 60 tablet 3  . benzonatate (TESSALON) 200 MG capsule Take 1 capsule (200 mg total) by mouth 3 (three) times daily as needed (with full glass of water). 20 capsule 0  . Blood Glucose Monitoring Suppl (FREESTYLE LITE) DEVI Use as directed four times daily E11.9 1 each 0  . brompheniramine-pseudoephedrine-DM 30-2-10 MG/5ML syrup Take 5 mLs by mouth 4 (four) times daily as needed. 120 mL 0  . butalbital-acetaminophen-caffeine (FIORICET, ESGIC) 50-325-40 MG tablet TAKE 1 TABLET BY MOUTH EVERY 6 HOURS AS NEEDED FOR HEADACHE (MAX OF 1 OR 2 TABLETS PER DAY) 30 tablet 5  . DENTA 5000 PLUS 1.1 % CREA dental cream   3  . esomeprazole (NEXIUM) 40 MG capsule TAKE 1 CAPSULE BY MOUTH 2 TIMES DAILY BEFORE A MEAL. 180 capsule 3  . estradiol (ESTRACE) 0.1 MG/GM vaginal cream     . gabapentin (NEURONTIN) 300 MG capsule TAKE 1 CAPSULE BY MOUTH 2 TIMES DAILY. 60 capsule 3  . glucose blood (FREESTYLE LITE) test strip  Use as instructed four times daily E11.9 400 each 12  . hydrOXYzine (ATARAX/VISTARIL) 10 MG tablet Take 1 tablet (10 mg total) by mouth 3 (three) times daily as needed. 30 tablet 0  . insulin aspart (NOVOLOG FLEXPEN) 100 UNIT/ML FlexPen Inject 6-10 Units into the skin 3 (three) times daily before meals. 15 mL 11  . Insulin Glargine (LANTUS SOLOSTAR) 100 UNIT/ML Solostar Pen INJECT 40 UNITS IN THE MORNING. 33 mL 1  . insulin lispro (HUMALOG KWIKPEN) 100 UNIT/ML KiwkPen Inject 0.15 mLs (15 Units total) into the skin 3 (three) times daily. 30 mL 5  . Insulin Pen Needle (UNIFINE PENTIPS) 32G X 4 MM MISC USE 4x a day 200 each PRN  . JANUVIA  100 MG tablet TAKE 1 TABLET (100 MG TOTAL) BY MOUTH DAILY. 90 tablet 1  . Lancets (FREESTYLE) lancets Use as instructed four times daily E11.9 400 each 12  . Lasmiditan Succinate (REYVOW) 100 MG TABS Take 100 mg by mouth as needed. Take 1 tablet for headache. No more than 1 tablet in 24 hours 8 tablet 3  . linaclotide (LINZESS) 145 MCG CAPS capsule Take 1 capsule (145 mcg total) by mouth daily before breakfast. 30 capsule 2  . meloxicam (MOBIC) 15 MG tablet TAKE 1 TABLET (15 MG TOTAL) BY MOUTH DAILY. 90 tablet 0  . metFORMIN (GLUCOPHAGE-XR) 500 MG 24 hr tablet TAKE 2 TABLETS BY MOUTH 2 TIMES DAILY WITH A MEAL. 360 tablet 3  . methocarbamol (ROBAXIN) 500 MG tablet Take 1 tablet (500 mg total) by mouth 2 (two) times daily as needed for muscle spasms. 30 tablet 2  . metoCLOPramide (REGLAN) 5 MG tablet Take 1 tablet (5 mg total) by mouth every 8 (eight) hours as needed for nausea or vomiting. 40 tablet 1  . naproxen (NAPROSYN) 500 MG tablet TAKE 1 TABLET BY MOUTH 2 TIMES DAILY WITH A MEAL 60 tablet 0  . nystatin-triamcinolone ointment (MYCOLOG)     . OnabotulinumtoxinA (BOTOX IJ) Inject 1 each as directed every 3 (three) months.    . ondansetron (ZOFRAN) 4 MG tablet TAKE 1 TABLET BY MOUTH EVERY 8 HOURS AS NEEDED FOR NAUSEA OR VOMITING. 40 tablet 1  . polyethylene glycol powder (GLYCOLAX/MIRALAX) 17 GM/SCOOP powder Take 17 g by mouth 2 (two) times daily as needed. 3350 g 1  . predniSONE (DELTASONE) 20 MG tablet Take 1 tablet (20 mg total) by mouth daily with breakfast. 5 tablet 0  . rosuvastatin (CRESTOR) 40 MG tablet TAKE 1 TABLET (40 MG TOTAL) BY MOUTH DAILY. 90 tablet 3  . traMADol (ULTRAM) 50 MG tablet TAKE 1 TABLET BY MOUTH EVERY 8 HOURS AS NEEDED 60 tablet 1  . Ubrogepant (UBRELVY) 100 MG TABS Take 1 tablet by mouth every 2 (two) hours as needed (Maximum 2 tablets/24 hours). 20 tablet 5  . Cetirizine HCl 10 MG CAPS Take 1 capsule (10 mg total) by mouth daily for 15 days. 15 capsule 0  .  fluticasone (FLONASE) 50 MCG/ACT nasal spray Place 1-2 sprays into both nostrils daily for 7 days. 1 g 0  . SUMAtriptan Succinate (ZEMBRACE SYMTOUCH) 3 MG/0.5ML SOAJ Inject 1 Device into the skin once. May repeat dose once after 1 hour if headache persists or returns (do not exceed two injections in 24 hours) 9 pen 0   No current facility-administered medications on file prior to visit.    Review of Systems  Constitutional: Negative for other unusual diaphoresis or sweats HENT: Negative for ear discharge or swelling Eyes: Negative  for other worsening visual disturbances Respiratory: Negative for stridor or other swelling  Gastrointestinal: Negative for worsening distension or other blood Genitourinary: Negative for retention or other urinary change Musculoskeletal: Negative for other MSK pain or swelling Skin: Negative for color change or other new lesions Neurological: Negative for worsening tremors and other numbness  Psychiatric/Behavioral: Negative for worsening agitation or other fatigue All other system neg per pt    Objective:   Physical Exam BP 112/70   Pulse 100   Temp 98 F (36.7 C) (Oral)   Ht 5\' 1"  (1.549 m)   Wt 152 lb (68.9 kg)   SpO2 99%   BMI 28.72 kg/m  VS noted,  Constitutional: Pt appears in NAD HENT: Head: NCAT.  Right Ear: External ear normal.  Left Ear: External ear normal.  Eyes: . Pupils are equal, round, and reactive to light. Conjunctivae and EOM are normal Nose: without d/c or deformity Neck: Neck supple. Gross normal ROM Cardiovascular: Normal rate and regular rhythm.   Pulmonary/Chest: Effort normal and breath sounds without rales or wheezing.  Abd:  Soft, NT, ND, + BS, no organomegaly Neurological: Pt is alert. At baseline orientation, motor grossly intact Skin: Skin is warm. No rashes, other new lesions, no LE edema Psychiatric: Pt behavior is normal without agitation  All other system neg per pt  Lab Results  Component Value Date   WBC  7.9 11/14/2018   HGB 12.7 11/14/2018   HCT 37.8 11/14/2018   PLT 398.0 11/14/2018   GLUCOSE 247 (H) 11/14/2018   CHOL 274 (H) 08/23/2018   TRIG 322.0 (H) 08/23/2018   HDL 55.20 08/23/2018   LDLDIRECT 191.0 08/23/2018   LDLCALC 194 (H) 11/23/2017   ALT 11 11/14/2018   AST 11 11/14/2018   NA 132 (L) 11/14/2018   K 3.5 11/14/2018   CL 97 11/14/2018   CREATININE 0.67 11/14/2018   BUN 7 11/14/2018   CO2 28 11/14/2018   TSH 0.84 11/14/2018   INR 1.08 07/23/2009   HGBA1C 10.2 (H) 11/14/2018   MICROALBUR <0.7 06/25/2016       Assessment & Plan:

## 2019-03-02 ENCOUNTER — Other Ambulatory Visit: Payer: Self-pay

## 2019-03-02 ENCOUNTER — Ambulatory Visit: Payer: PRIVATE HEALTH INSURANCE | Attending: Orthopedic Surgery | Admitting: Physical Therapy

## 2019-03-02 ENCOUNTER — Ambulatory Visit: Payer: No Typology Code available for payment source | Admitting: Gastroenterology

## 2019-03-02 DIAGNOSIS — R262 Difficulty in walking, not elsewhere classified: Secondary | ICD-10-CM | POA: Insufficient documentation

## 2019-03-02 DIAGNOSIS — M25561 Pain in right knee: Secondary | ICD-10-CM | POA: Insufficient documentation

## 2019-03-02 DIAGNOSIS — M6281 Muscle weakness (generalized): Secondary | ICD-10-CM | POA: Diagnosis present

## 2019-03-02 NOTE — Therapy (Signed)
[place], [place], [address] Phone: [phone]   Fax:  [phone]  Physical Therapy Treatment  Patient Details  Name: Rachel [name] MRN: [id number] Date of Birth: 08/06/1965 Referring Provider (PT): Dr. Berenice Primas    Encounter Date: 03/02/2019  PT End of Session - 03/02/19 0904    Visit Number  1    Number of Visits  9    Date for PT Re-Evaluation  04/13/19    Authorization Type  WC    Authorization Time Period  8 treatments auth    Authorization - Visit Number  0    Authorization - Number of Visits  8    PT Start Time  [id number]   pt late   PT Stop Time  0916    PT Time Calculation (min)  38 min    Activity Tolerance  Patient tolerated treatment well       Past Medical History:  Diagnosis Date  . ALLERGIC RHINITIS 10/04/2007  . Anemia 01/21/2011  . ASTHMA 08/02/2007   INHALERS ONLY IN [place]  . Blood transfusion without reported diagnosis    with first child   . COMMON MIGRAINE 05/15/2009  . DIABETES MELLITUS, TYPE II 08/02/2007  . GERD 08/02/2007  . HYPERLIPIDEMIA 08/02/2007  . INSOMNIA-SLEEP DISORDER-UNSPEC 01/04/2008  . INTERMITTENT VERTIGO 05/15/2009  . LIBIDO, DECREASED 01/09/2010    Past Surgical History:  Procedure Laterality Date  . CESAREAN SECTION     x 3  . CYST EXCISION     left knee  . PAROTIDECTOMY  10/21/2011   Procedure: PAROTIDECTOMY;  Surgeon: Melida Quitter, MD;  Location: [facility];  Service: ENT;  Laterality: Left;    There were no vitals filed for this visit.  Subjective Assessment - 03/02/19 0841    Subjective  Fell at work while in the patient's room 01/22/19.  She does not know if she hit her knee as she went down.  Both knees iniitally hurt and now it is just the Rt. She had an injection about 1-2 weeks ago. It did not help. Her knee feels weak when she walks, pain is interfering with her work, home, recreation.  Pain increases in Rt knee and calf as she walks and exercises.    Pertinent History  headachesa and backaches are worse since fall, diabetes    Limitations  Standing;House hold activities;Walking;Other (comment);Lifting   stairs , sleep   How long can you sit comfortably?  props leg , painful    How long can you stand comfortably?  stands at work for many hours , currently on light duty . not comfortable > 10 min    How long can you walk comfortably?  10 min or less    Diagnostic tests  MRI showed mild degeneration of laterla meniscus    Patient Stated Goals  Return to work, walking 30 min, do my routine    Currently in Pain?  Yes    Pain Score  7     Pain Location  Knee    Pain Orientation  Right    Pain Descriptors / Indicators  Burning    Pain Type  Acute pain    Pain Radiating Towards  shin    Pain Onset  More than a month ago    Pain Frequency  Constant    Aggravating Factors   heat, activity, walking    Pain Relieving Factors  positioning, medicine, ice    Effect of Pain on Daily Activities  unable to work, walk         [facility] PT Assessment - 03/02/19 0001      Assessment   Medical Diagnosis  knee pain R     Referring Provider (PT)  Dr. Berenice Primas     Onset Date/Surgical Date  01/22/19    Hand Dominance  Right    Next MD Visit  03/10/19    Prior Therapy  Yes for shoulder       Precautions   Precautions  None    Required Braces or Orthoses  --   has a knee brace, [place]? wears it when walking      Restrictions   Weight Bearing Restrictions  No      Balance Screen   Has the patient fallen in the past 6 months  Yes    How many times?  1, this injury    many times in the past, not sure why    Has the patient had a decrease in activity level because of a fear of falling?   Yes    Is the patient reluctant to leave their home because of a fear of falling?   No      Home Environment   Living Environment  Private residence    Living Arrangements  Spouse/significant other;Children    Type of [place] Access  Level entry     Home Layout  Two level      Prior Function   Level of Independence  Independent with basic ADLs;Independent with household mobility without device;Independent with community mobility without device    Vocation  Full time employment    Manufacturing engineer     Leisure  walking, exercise       Cognition   Overall Cognitive Status  Within Functional Limits for tasks assessed      Observation/Other Assessments   Focus on Therapeutic Outcomes (FOTO)   57%      Observation/Other Assessments-Edema    Edema  Circumferential      Circumferential Edema   Circumferential - Right  14 7/8 inch     Circumferential - Left   14 6/8 inch       Sensation   Light Touch  Appears Intact      AROM   Right Knee Extension  0    Right Knee Flexion  125    Left Knee Extension  0    Left Knee Flexion  126      Strength   Right Hip Flexion  4/5    Right Hip ABduction  3+/5    Right Hip ADduction  3/5    Left Hip Flexion  4+/5    Left Hip ABduction  4-/5    Left Hip ADduction  3/5    Right Knee Flexion  3+/5    Right Knee Extension  3+/5    Left Knee Flexion  5/5    Left Knee Extension  4+/5    Right Ankle Dorsiflexion  4/5    Right Ankle Plantar Flexion  4/5    Left Ankle Dorsiflexion  5/5    Left Ankle Plantar Flexion  4/5           PT Education - 03/02/19 0915    Education Details  POC, HEP, PT, meniscus function, OK to walk with brace, ice post    Person(s) Educated  Patient    Methods  Explanation;Demonstration;Handout;Verbal cues    Comprehension  Verbal  cues required;Verbalized understanding;Need further instruction          PT Long Term Goals - 03/02/19 1032      PT LONG TERM GOAL #1   Title  FOTO score will be 42% to demo increased functional mobility.    Time  6    Period  Weeks    Status  New    Target Date  04/13/19      PT LONG TERM GOAL #2   Title  Pt will be able to squat , lift 25 lbs without knee pain    Time  6    Period  Weeks    Status   New    Target Date  04/13/19      PT LONG TERM GOAL #3   Title  Pt will show strength in Rt knee to >/=4+/5 for normalize knee mechanics, support.    Baseline  3+/5 pain inhibition    Time  6    Period  Weeks    Status  New    Target Date  04/13/19      PT LONG TERM GOAL #4   Title  Pt will be able to walk 20 min with pain no more than moderate pain 5/10 without brace    Time  6    Period  Weeks    Status  New    Target Date  04/13/19      PT LONG TERM GOAL #5   Title  Pt will be able to show Independence with HEP    Time  6    Period  Weeks    Status  New    Target Date  04/13/19            Plan - 03/02/19 [id number]    Clinical Impression Statement  Pt presents for low complexity eval of Rt knee pain which occurred a result of a fall at work.  She has had pain since then, presents today with weakness, pain and difficulty walking, negotiating stairs.  She has been working light duty and would like to be able to resume her normal job duties.  She wears a brace when she is walking.  She will benefit from skilled PT to improve her functional mobility and restore normal knee function.    Personal Factors and Comorbidities  Behavior Pattern;Comorbidity 1;Profession    Comorbidities  diabetes, depression    Examination-Activity Limitations  Squat;Stairs;Lift;Bend;Stand;Sleep;Transfers    Examination-Participation Restrictions  Shop;Community Activity;Interpersonal Relationship    Stability/Clinical Decision Making  Stable/Uncomplicated    Clinical Decision Making  Moderate    Rehab Potential  Excellent    PT Frequency  4x / week    PT Duration  4 weeks   allow 6 weeks for scheduling   PT Treatment/Interventions  ADLs/Self Care Home Management;Therapeutic activities;Patient/family education;Taping;Therapeutic exercise;Cryotherapy;Ultrasound;Functional mobility training;Manual techniques;Passive range of motion;Iontophoresis 4mg /ml Dexamethasone;Moist Heat;Neuromuscular  re-education;Gait training;Electrical Stimulation    PT Next Visit Plan  check HEP, [place], ice and progress tolerance for standing, strength    PT Home Exercise Plan  quad set, bridge on heels, hamstring stretch, SLR    Consulted and Agree with Plan of Care  Patient       Patient will benefit from skilled therapeutic intervention in order to improve the following deficits and impairments:  Abnormal gait, Increased fascial restricitons, Pain, Decreased mobility, Postural dysfunction, Impaired flexibility, Decreased range of motion, Decreased strength, Decreased balance  Visit Diagnosis: Acute pain of right knee  Muscle weakness (generalized)  Difficulty in walking, not elsewhere classified     Problem List Patient Active Problem List   Diagnosis Date Noted  . Right knee pain 01/31/2019  . Nausea and vomiting 01/27/2019  . Trigger thumb of right hand 11/22/2018  . Anxiety 11/15/2018  . Abdominal pain 11/14/2018  . Dyspnea 11/14/2018  . Acute sinus infection 11/12/2018  . Constipation 11/12/2018  . Nonallopathic lesion of rib cage 12/21/2017  . Orthostasis 05/11/2017  . Cervicogenic headache 04/27/2017  . Nonallopathic lesion of cervical region 04/27/2017  . Rheumatoid factor positive 02/10/2016  . Myalgia and myositis 01/08/2016  . Low back pain 12/23/2015  . Left shoulder pain 12/23/2015  . Nonallopathic lesion of lumbosacral region 07/29/2015  . Nonallopathic lesion of sacral region 07/29/2015  . Nonallopathic lesion of thoracic region 07/29/2015  . Loss of weight 06/12/2014  . Dysphagia, pharyngoesophageal phase 06/12/2014  . Headache(784.0) 12/18/2012  . Recurrent falls 06/16/2012  . Chest pain 06/16/2012  . Back pain 03/06/2012  . Syncope and collapse 06/18/2011  . Fatigue 01/21/2011  . Microcytic anemia 01/21/2011  . Cervical radiculopathy 01/21/2011  . Preventative health care 10/11/2010  . Depression 01/09/2010  . LIBIDO, DECREASED 01/09/2010  . Migraine  without aura 05/15/2009  . INTERMITTENT VERTIGO 05/15/2009  . INSOMNIA-SLEEP DISORDER-UNSPEC 01/04/2008  . Allergic rhinitis 10/04/2007  . Type 2 diabetes mellitus with hyperglycemia, with long-term current use of insulin ([place]) 08/02/2007  . Hyperlipidemia 08/02/2007  . Asthma 08/02/2007  . GERD 08/02/2007    PAA,JENNIFER 03/02/2019, 10:52 AM  [facility] [address] [place], [place], [address] Phone: [phone]   Fax:  [phone]  Name: Annaston Gallego MRN: [id number] Date of Birth: 1965/12/22  Raeford Razor, PT 03/02/19 10:52 AM Phone: [phone] Fax: [phone]

## 2019-03-07 ENCOUNTER — Ambulatory Visit (INDEPENDENT_AMBULATORY_CARE_PROVIDER_SITE_OTHER): Payer: No Typology Code available for payment source | Admitting: Family Medicine

## 2019-03-07 ENCOUNTER — Encounter: Payer: Self-pay | Admitting: Family Medicine

## 2019-03-07 VITALS — BP 90/62 | HR 103 | Ht 61.0 in | Wt 152.0 lb

## 2019-03-07 DIAGNOSIS — G4486 Cervicogenic headache: Secondary | ICD-10-CM

## 2019-03-07 DIAGNOSIS — M542 Cervicalgia: Secondary | ICD-10-CM | POA: Diagnosis not present

## 2019-03-07 DIAGNOSIS — G8929 Other chronic pain: Secondary | ICD-10-CM | POA: Diagnosis not present

## 2019-03-07 DIAGNOSIS — M999 Biomechanical lesion, unspecified: Secondary | ICD-10-CM

## 2019-03-07 DIAGNOSIS — R51 Headache: Secondary | ICD-10-CM

## 2019-03-07 MED ORDER — KETOROLAC TROMETHAMINE 60 MG/2ML IM SOLN
60.0000 mg | Freq: Once | INTRAMUSCULAR | Status: AC
  Administered 2019-03-07: 60 mg via INTRAMUSCULAR

## 2019-03-07 MED ORDER — METHYLPREDNISOLONE ACETATE 80 MG/ML IJ SUSP
80.0000 mg | Freq: Once | INTRAMUSCULAR | Status: AC
  Administered 2019-03-07: 80 mg via INTRAMUSCULAR

## 2019-03-07 NOTE — Assessment & Plan Note (Signed)
Patient continues to have pain that seems to be out of proportion to the amount of palpation.  Discussed with patient icing regimen and home exercises.  Toradol and Depo-Medrol given again today.  Due to patient not responding radicular MRI of the neck is necessary.  Do not feel MRI of the brain is necessary with normal MRIs previously.  Patient has intermittent radicular symptoms will rule out any type of nerve impingement or syrinx but could be contributing.  Follow-up with me again in 6 weeks

## 2019-03-07 NOTE — Progress Notes (Signed)
[name] [place], [place] [address] Phone: [phone] Subjective:   I Rachel [name] am serving as a Education administrator for Dr. Hulan Saas.    CC: Back pain  [id number]  Rachel [name] is a 53 y.o. female coming in with complaint of back pain. Patient states she is not feeling well today.  Worsening neck pain recently.  Worsening headaches.  Since then for 2 weeks and with manipulation seems to be better and then seems to be worsening.  This time unfortunately having significant worsening.  Patient describes the pain as a dull, throbbing aching sensation.  Affecting daily activities and regular basis.     Past Medical History:  Diagnosis Date  . ALLERGIC RHINITIS 10/04/2007  . Anemia 01/21/2011  . ASTHMA 08/02/2007   INHALERS ONLY IN [place]  . Blood transfusion without reported diagnosis    with first child   . COMMON MIGRAINE 05/15/2009  . DIABETES MELLITUS, TYPE II 08/02/2007  . GERD 08/02/2007  . HYPERLIPIDEMIA 08/02/2007  . INSOMNIA-SLEEP DISORDER-UNSPEC 01/04/2008  . INTERMITTENT VERTIGO 05/15/2009  . LIBIDO, DECREASED 01/09/2010   Past Surgical History:  Procedure Laterality Date  . CESAREAN SECTION     x 3  . CYST EXCISION     left knee  . PAROTIDECTOMY  10/21/2011   Procedure: PAROTIDECTOMY;  Surgeon: Melida Quitter, MD;  Location: [facility] OR;  Service: ENT;  Laterality: Left;   Social History   Socioeconomic History  . Marital status: Married    Spouse name: [name]  . Number of children: 3  . Years of education: Degree  . Highest education level: Not on file  Occupational History  . Occupation: Research scientist (life sciences): [employer]  . Occupation: Chartered certified accountant    Employer: [employer]  . Financial resource strain: Not on file  . Food insecurity    Worry: Not on file    Inability: Not on file  . Transportation needs    Medical: Not on file    Non-medical: Not on file  Tobacco Use  . Smoking status:  Never Smoker  . Smokeless tobacco: Never Used  Substance and Sexual Activity  . Alcohol use: No    Alcohol/week: 0.0 standard drinks  . Drug use: No  . Sexual activity: Yes  Lifestyle  . Physical activity    Days per week: Not on file    Minutes per session: Not on file  . Stress: Not on file  Relationships  . Social Herbalist on phone: Not on file    Gets together: Not on file    Attends religious service: Not on file    Active member of club or organization: Not on file    Attends meetings of clubs or organizations: Not on file    Relationship status: Not on file  Other Topics Concern  . Not on file  Social History Narrative   She has 3 daughters.   Patient has a 4 year degree.    Patient working at [employer].    Patient is married to [place].   Allergies  Allergen Reactions  . Lipitor [Atorvastatin]     REACTION: myalgias  . Nitroglycerin    Family History  Problem Relation Age of Onset  . Hypertension Father   . Diabetes Father   . Asthma Father   . Cervical cancer Maternal Aunt   . Heart disease Maternal Aunt   .  Anesthesia problems Neg Hx   . Colon cancer Neg Hx     Current Outpatient Medications (Endocrine & Metabolic):  .  insulin aspart (NOVOLOG FLEXPEN) 100 UNIT/ML FlexPen, Inject 6-10 Units into the skin 3 (three) times daily before meals. .  Insulin Glargine (LANTUS SOLOSTAR) 100 UNIT/ML Solostar Pen, INJECT 40 UNITS IN THE MORNING. .  insulin lispro (HUMALOG KWIKPEN) 100 UNIT/ML KiwkPen, Inject 0.15 mLs (15 Units total) into the skin 3 (three) times daily. Marland Kitchen  JANUVIA 100 MG tablet, TAKE 1 TABLET (100 MG TOTAL) BY MOUTH DAILY. .  metFORMIN (GLUCOPHAGE-XR) 500 MG 24 hr tablet, TAKE 2 TABLETS BY MOUTH 2 TIMES DAILY WITH A MEAL. .  predniSONE (DELTASONE) 20 MG tablet, Take 1 tablet (20 mg total) by mouth daily with breakfast.  Current Outpatient Medications (Cardiovascular):  .  atenolol (TENORMIN) 25 MG tablet, Take 1 tablet (25 mg total) by  mouth daily. X 7 days, then increase to 2 tabs (50mg ) daily .  rosuvastatin (CRESTOR) 40 MG tablet, TAKE 1 TABLET (40 MG TOTAL) BY MOUTH DAILY.  Current Outpatient Medications (Respiratory):  .  benzonatate (TESSALON) 200 MG capsule, Take 1 capsule (200 mg total) by mouth 3 (three) times daily as needed (with full glass of water). .  brompheniramine-pseudoephedrine-DM 30-2-10 MG/5ML syrup, Take 5 mLs by mouth 4 (four) times daily as needed. .  Cetirizine HCl 10 MG CAPS, Take 1 capsule (10 mg total) by mouth daily for 15 days. .  fluticasone (FLONASE) 50 MCG/ACT nasal spray, Place 1-2 sprays into both nostrils daily for 7 days.  Current Outpatient Medications (Analgesics):  .  aspirin 81 MG EC tablet, Take 81 mg by mouth daily.   .  butalbital-acetaminophen-caffeine (FIORICET, ESGIC) 50-325-40 MG tablet, TAKE 1 TABLET BY MOUTH EVERY 6 HOURS AS NEEDED FOR HEADACHE (MAX OF 1 OR 2 TABLETS PER DAY) .  Lasmiditan Succinate (REYVOW) 100 MG TABS, Take 100 mg by mouth as needed. Take 1 tablet for headache. No more than 1 tablet in 24 hours .  meloxicam (MOBIC) 15 MG tablet, TAKE 1 TABLET (15 MG TOTAL) BY MOUTH DAILY. .  naproxen (NAPROSYN) 500 MG tablet, TAKE 1 TABLET BY MOUTH 2 TIMES DAILY WITH A MEAL .  traMADol (ULTRAM) 50 MG tablet, TAKE 1 TABLET BY MOUTH EVERY 8 HOURS AS NEEDED .  Ubrogepant (UBRELVY) 100 MG TABS, Take 1 tablet by mouth every 2 (two) hours as needed (Maximum 2 tablets/24 hours). .  SUMAtriptan Succinate (ZEMBRACE SYMTOUCH) 3 MG/0.5ML SOAJ, Inject 1 Device into the skin once. May repeat dose once after 1 hour if headache persists or returns (do not exceed two injections in 24 hours)   Current Outpatient Medications (Other):  .  Blood Glucose Monitoring Suppl (FREESTYLE LITE) DEVI, Use as directed four times daily E11.9 .  cyclobenzaprine (FLEXERIL) 5 MG tablet, Take 1 tablet (5 mg total) by mouth 3 (three) times daily as needed for muscle spasms. .  DENTA 5000 PLUS 1.1 % CREA  dental cream,  .  esomeprazole (NEXIUM) 40 MG capsule, TAKE 1 CAPSULE BY MOUTH 2 TIMES DAILY BEFORE A MEAL. Marland Kitchen  estradiol (ESTRACE) 0.1 MG/GM vaginal cream,  .  gabapentin (NEURONTIN) 300 MG capsule, TAKE 1 CAPSULE BY MOUTH 2 TIMES DAILY. Marland Kitchen  glucose blood (FREESTYLE LITE) test strip, Use as instructed four times daily E11.9 .  hydrOXYzine (ATARAX/VISTARIL) 10 MG tablet, Take 1 tablet (10 mg total) by mouth 3 (three) times daily as needed. .  Insulin Pen Needle (UNIFINE PENTIPS) 32G  X 4 MM MISC, USE 4x a day .  Lancets (FREESTYLE) lancets, Use as instructed four times daily E11.9 .  linaclotide (LINZESS) 145 MCG CAPS capsule, Take 1 capsule (145 mcg total) by mouth daily before breakfast. .  methocarbamol (ROBAXIN) 500 MG tablet, Take 1 tablet (500 mg total) by mouth 2 (two) times daily as needed for muscle spasms. .  metoCLOPramide (REGLAN) 5 MG tablet, Take 1 tablet (5 mg total) by mouth every 8 (eight) hours as needed for nausea or vomiting. .  nystatin-triamcinolone ointment (MYCOLOG),  .  OnabotulinumtoxinA (BOTOX IJ), Inject 1 each as directed every 3 (three) months. .  ondansetron (ZOFRAN) 4 MG tablet, TAKE 1 TABLET BY MOUTH EVERY 8 HOURS AS NEEDED FOR NAUSEA OR VOMITING. .  polyethylene glycol powder (GLYCOLAX/MIRALAX) 17 GM/SCOOP powder, Take 17 g by mouth 2 (two) times daily as needed.    Past medical history, social, surgical and family history all reviewed in electronic medical record.  No pertanent information unless stated regarding to the chief complaint.   Review of Systems:  No visual changes, nausea, vomiting, diarrhea, constipation, dizziness, abdominal pain, skin rash, fevers, chills, night sweats, weight loss, swollen lymph nodes,  chest pain, shortness of breath, mood changes.  Positive muscle aches, body aches, headaches Objective  Blood pressure 90/62, pulse (!) 103, height 5\' 1"  (1.549 m), weight 152 lb (68.9 kg), SpO2 96 %.    General: No apparent distress alert and  oriented x3 mood and affect normal, dressed appropriately.  HEENT: Pupils equal, extraocular movements intact  Respiratory: Patient's speak in full sentences and does not appear short of breath  Cardiovascular: No lower extremity edema, non tender, no erythema  Skin: Warm dry intact with no signs of infection or rash on extremities or on axial skeleton.  Abdomen: Soft nontender  Neuro: Cranial nerves II through XII are intact, neurovascularly intact in all extremities with 2+ DTRs and 2+ pulses.  Lymph: No lymphadenopathy of posterior or anterior cervical chain or axillae bilaterally.  Gait normal with good balance and coordination.  MSK:  tender with full range of motion and good stability and symmetric strength and tone of shoulders, elbows, wrist, hip, knee and ankles bilaterally.  Neck exam has loss of lordosis.  Severe tenderness to palpation in the paraspinal musculature.  Worsening previous exam.  Patient has mild positive Spurling's with right-sided radicular symptoms noted today.  Pain is out of proportion to the amount of palpation.   Osteopathic findings  C6 flexed rotated and side bent left T3 extended rotated and side bent right inhaled third rib T7 extended rotated and side bent left L2 flexed rotated and side bent right Sacrum right on right    Impression and Recommendations:     This case required medical decision making of moderate complexity. The above documentation has been reviewed and is accurate and complete Lyndal Pulley, DO       Note: This dictation was prepared with Dragon dictation along with smaller phrase technology. Any transcriptional errors that result from this process are unintentional.

## 2019-03-07 NOTE — Assessment & Plan Note (Signed)
Decision today to treat with OMT was based on Physical Exam  After verbal consent patient was treated with HVLA, ME, FPR techniques in cervical, thoracic, rib, lumbar and sacral areas  Patient tolerated the procedure well with improvement in symptoms  Patient given exercises, stretches and lifestyle modifications  See medications in patient instructions if given  Patient will follow up in 6 weeks 

## 2019-03-07 NOTE — Patient Instructions (Addendum)
Good to see you MRI neck [facility] [phone] See me again in 6 weeks

## 2019-03-08 ENCOUNTER — Ambulatory Visit: Payer: PRIVATE HEALTH INSURANCE | Admitting: Physical Therapy

## 2019-03-08 ENCOUNTER — Encounter: Payer: Self-pay | Admitting: Physical Therapy

## 2019-03-08 ENCOUNTER — Other Ambulatory Visit: Payer: Self-pay

## 2019-03-08 ENCOUNTER — Ambulatory Visit: Payer: PRIVATE HEALTH INSURANCE | Attending: Orthopedic Surgery | Admitting: Physical Therapy

## 2019-03-08 DIAGNOSIS — R262 Difficulty in walking, not elsewhere classified: Secondary | ICD-10-CM | POA: Insufficient documentation

## 2019-03-08 DIAGNOSIS — M6281 Muscle weakness (generalized): Secondary | ICD-10-CM | POA: Diagnosis present

## 2019-03-08 DIAGNOSIS — M25561 Pain in right knee: Secondary | ICD-10-CM | POA: Insufficient documentation

## 2019-03-09 ENCOUNTER — Encounter: Payer: Self-pay | Admitting: Physical Therapy

## 2019-03-09 NOTE — Therapy (Addendum)
[place], [place], [address] Phone: [phone]   Fax:  [phone]  Physical Therapy Treatment  Patient Details  Name: Rachel [name] MRN: [id number] Date of Birth: Feb 21, 1966 Referring Provider (PT): Dr. Berenice Primas    Encounter Date: 03/08/2019  PT End of Session - 03/09/19 0924    Visit Number  2    Number of Visits  9    Date for PT Re-Evaluation  04/13/19    Authorization Type  WC    Authorization Time Period  8 treatments auth    PT Start Time  1415    PT Stop Time  1456    PT Time Calculation (min)  41 min    Activity Tolerance  Patient tolerated treatment well    Behavior During Therapy  [facility] for tasks assessed/performed       Past Medical History:  Diagnosis Date  . ALLERGIC RHINITIS 10/04/2007  . Anemia 01/21/2011  . ASTHMA 08/02/2007   INHALERS ONLY IN [place]  . Blood transfusion without reported diagnosis    with first child   . COMMON MIGRAINE 05/15/2009  . DIABETES MELLITUS, TYPE II 08/02/2007  . GERD 08/02/2007  . HYPERLIPIDEMIA 08/02/2007  . INSOMNIA-SLEEP DISORDER-UNSPEC 01/04/2008  . INTERMITTENT VERTIGO 05/15/2009  . LIBIDO, DECREASED 01/09/2010    Past Surgical History:  Procedure Laterality Date  . CESAREAN SECTION     x 3  . CYST EXCISION     left knee  . PAROTIDECTOMY  10/21/2011   Procedure: PAROTIDECTOMY;  Surgeon: Melida Quitter, MD;  Location: [facility];  Service: ENT;  Laterality: Left;    There were no vitals filed for this visit.  Subjective Assessment - 03/09/19 0923    Subjective  Patient reports no real change in her knee., It is still painful. She feels like it is swelling. She got an injection whcih helped.    Pertinent History  headachesa and backaches are worse since fall, diabetes    Limitations  Standing;House hold activities;Walking;Other (comment);Lifting    How long can you sit comfortably?  props leg , painful    How long can you stand comfortably?  stands at  work for many hours , currently on light duty . not comfortable > 10 min    How long can you walk comfortably?  10 min or less    Diagnostic tests  MRI showed mild degeneration of laterla meniscus    Patient Stated Goals  Return to work, walking 30 min, do my routine    Currently in Pain?  Yes    Pain Score  7     Pain Location  Knee    Pain Orientation  Right    Pain Descriptors / Indicators  Burning    Pain Type  Acute pain    Pain Onset  More than a month ago    Pain Frequency  Constant    Aggravating Factors   heat, activity, walking    Pain Relieving Factors  positioning, medicine, ice    Effect of Pain on Daily Activities  unable to work                       [facility] Adult PT Treatment/Exercise - 03/09/19 0001      Knee/Hip Exercises: Standing   Heel Raises Limitations  2x10       Knee/Hip Exercises: Supine   Quad Sets Limitations  x15 right 5 sec hold     Short  Arc Quad Sets Limitations  x15 each leg     Bridges Limitations  2x10     Straight Leg Raises Limitations  2x10     Patellar Mobs  looked at patella motion but good       Modalities   Modalities  Ultrasound      Ultrasound   Ultrasound Location  right knee     Ultrasound Parameters  1.5w/cm2     Ultrasound Goals  Edema;Pain             PT Education - 03/08/19 1421    Education Details  reviewed HEp and symptom mangement    Person(s) Educated  Patient    Methods  Explanation;Demonstration;Tactile cues;Verbal cues    Comprehension  Verbalized understanding;Returned demonstration;Verbal cues required;Tactile cues required          PT Long Term Goals - 03/09/19 0925      PT LONG TERM GOAL #1   Title  FOTO score will be 42% to demo increased functional mobility.    Time  6    Period  Weeks    Status  On-going      PT LONG TERM GOAL #2   Title  Pt will be able to squat , lift 25 lbs without knee pain    Time  6    Period  Weeks    Status  On-going      PT LONG TERM GOAL #3    Title  Pt will show strength in Rt knee to >/=4+/5 for normalize knee mechanics, support.    Baseline  3+/5 pain inhibition    Time  6    Period  Weeks    Status  On-going      PT LONG TERM GOAL #4   Title  Pt will be able to walk 20 min with pain no more than moderate pain 5/10 without brace    Time  6    Period  Weeks    Status  On-going      PT LONG TERM GOAL #5   Title  Pt will be able to show Independence with HEP    Time  6    Period  Weeks    Status  On-going            Plan - 03/08/19 1443    Clinical Impression Statement  Patient tolerated exercises well. She reported a minor increase in pain. Therapy added heel raise and standing march. Therapy also added ultrasound to decrease acute inflammation.    Personal Factors and Comorbidities  Behavior Pattern;Comorbidity 1;Profession    Comorbidities  diabetes, depression    Examination-Activity Limitations  Squat;Stairs;Lift;Bend;Stand;Sleep;Transfers    Examination-Participation Restrictions  Shop;Community Activity;Interpersonal Relationship    Stability/Clinical Decision Making  Stable/Uncomplicated    Clinical Decision Making  Moderate    Rehab Potential  Excellent    PT Frequency  4x / week    PT Duration  4 weeks    PT Treatment/Interventions  ADLs/Self Care Home Management;Therapeutic activities;Patient/family education;Taping;Therapeutic exercise;Cryotherapy;Ultrasound;Functional mobility training;Manual techniques;Passive range of motion;Iontophoresis 4mg /ml Dexamethasone;Moist Heat;Neuromuscular re-education;Gait training;Electrical Stimulation    PT Next Visit Plan  check HEP, [place], ice and progress tolerance for standing, strength    PT Home Exercise Plan  quad set, bridge on heels, hamstring stretch, SLR    Consulted and Agree with Plan of Care  Patient       Patient will benefit from skilled therapeutic intervention in order to improve the following deficits and impairments:  Abnormal gait, Increased fascial  restricitons, Pain, Decreased mobility, Postural dysfunction, Impaired flexibility, Decreased range of motion, Decreased strength, Decreased balance  Visit Diagnosis: Acute pain of right knee  Muscle weakness (generalized)  Difficulty in walking, not elsewhere classified     Problem List Patient Active Problem List   Diagnosis Date Noted  . Right knee pain 01/31/2019  . Nausea and vomiting 01/27/2019  . Trigger thumb of right hand 11/22/2018  . Anxiety 11/15/2018  . Abdominal pain 11/14/2018  . Dyspnea 11/14/2018  . Acute sinus infection 11/12/2018  . Constipation 11/12/2018  . Nonallopathic lesion of rib cage 12/21/2017  . Orthostasis 05/11/2017  . Cervicogenic headache 04/27/2017  . Nonallopathic lesion of cervical region 04/27/2017  . Rheumatoid factor positive 02/10/2016  . Myalgia and myositis 01/08/2016  . Low back pain 12/23/2015  . Left shoulder pain 12/23/2015  . Nonallopathic lesion of lumbosacral region 07/29/2015  . Nonallopathic lesion of sacral region 07/29/2015  . Nonallopathic lesion of thoracic region 07/29/2015  . Loss of weight 06/12/2014  . Dysphagia, pharyngoesophageal phase 06/12/2014  . Headache(784.0) 12/18/2012  . Recurrent falls 06/16/2012  . Chest pain 06/16/2012  . Back pain 03/06/2012  . Syncope and collapse 06/18/2011  . Fatigue 01/21/2011  . Microcytic anemia 01/21/2011  . Cervical radiculopathy 01/21/2011  . Preventative health care 10/11/2010  . Depression 01/09/2010  . LIBIDO, DECREASED 01/09/2010  . Migraine without aura 05/15/2009  . INTERMITTENT VERTIGO 05/15/2009  . INSOMNIA-SLEEP DISORDER-UNSPEC 01/04/2008  . Allergic rhinitis 10/04/2007  . Type 2 diabetes mellitus with hyperglycemia, with long-term current use of insulin ([place]) 08/02/2007  . Hyperlipidemia 08/02/2007  . Asthma 08/02/2007  . GERD 08/02/2007    Carney Living PT DPT  03/09/2019, 9:28 AM  [facility] [address] [place], [place], [address] Phone: [phone]   Fax:  [phone]  Name: Rachel [name] MRN: [id number] Date of Birth: 1966/06/09

## 2019-03-12 ENCOUNTER — Encounter: Payer: Self-pay | Admitting: Physical Therapy

## 2019-03-12 ENCOUNTER — Ambulatory Visit: Payer: PRIVATE HEALTH INSURANCE | Admitting: Physical Therapy

## 2019-03-12 ENCOUNTER — Other Ambulatory Visit: Payer: Self-pay

## 2019-03-12 DIAGNOSIS — M25561 Pain in right knee: Secondary | ICD-10-CM | POA: Diagnosis not present

## 2019-03-12 DIAGNOSIS — M6281 Muscle weakness (generalized): Secondary | ICD-10-CM

## 2019-03-12 DIAGNOSIS — R262 Difficulty in walking, not elsewhere classified: Secondary | ICD-10-CM

## 2019-03-12 NOTE — Therapy (Addendum)
[place], [place], [address] Phone: [phone]   Fax:  [phone]  Physical Therapy Treatment  Patient Details  Name: Rachel [name] MRN: [id number] Date of Birth: September 15, 1965 Referring Provider (PT): Dr. Berenice Primas    Encounter Date: 03/12/2019  PT End of Session - 03/12/19 1642    Visit Number  3    Number of Visits  9    Date for PT Re-Evaluation  04/13/19    Authorization Type  WC    Authorization - Visit Number  3    Authorization - Number of Visits  8    PT Start Time  [id number]    PT Stop Time  1403    PT Time Calculation (min)  44 min    Activity Tolerance  Patient tolerated treatment well    Behavior During Therapy  [facility] for tasks assessed/performed       Past Medical History:  Diagnosis Date  . ALLERGIC RHINITIS 10/04/2007  . Anemia 01/21/2011  . ASTHMA 08/02/2007   INHALERS ONLY IN [place]  . Blood transfusion without reported diagnosis    with first child   . COMMON MIGRAINE 05/15/2009  . DIABETES MELLITUS, TYPE II 08/02/2007  . GERD 08/02/2007  . HYPERLIPIDEMIA 08/02/2007  . INSOMNIA-SLEEP DISORDER-UNSPEC 01/04/2008  . INTERMITTENT VERTIGO 05/15/2009  . LIBIDO, DECREASED 01/09/2010    Past Surgical History:  Procedure Laterality Date  . CESAREAN SECTION     x 3  . CYST EXCISION     left knee  . PAROTIDECTOMY  10/21/2011   Procedure: PAROTIDECTOMY;  Surgeon: Melida Quitter, MD;  Location: [facility];  Service: ENT;  Laterality: Left;    There were no vitals filed for this visit.  Subjective Assessment - 03/12/19 1324    Subjective  Not much swelling.  feels like it is giving out at times.  I don't wear it at work because I'm sitting. Sees Ortho tomorrow.    Currently in Pain?  Yes    Pain Score  6     Pain Location  Knee    Pain Orientation  Right    Pain Descriptors / Indicators  Aching    Pain Type  Acute pain    Pain Onset  More than a month ago    Pain Frequency  Constant    Aggravating  Factors   heat, activity , walking    Pain Relieving Factors  meds, meds, ice    Effect of Pain on Daily Activities  unable to work           [facility] Adult PT Treatment/Exercise - 03/12/19 0001      Knee/Hip Exercises: Stretches   Active Hamstring Stretch  Both;3 reps    ITB Stretch  Both;3 reps      Knee/Hip Exercises: Aerobic   Nustep  5 min L6 UE and LE       Knee/Hip Exercises: Standing   Heel Raises  Both;2 sets;10 reps    Forward Lunges Limitations  isometric hold small ROM lunge with small movements x 10     Functional Squat Limitations  small ROm x 10 cues for weight in heels     SLS  Rt LE 3 trials min UE assist at chair       Knee/Hip Exercises: Supine   Quad Sets  Strengthening;Right;1 set;10 reps    Short Arc [employer]  Strengthening;Right;1 set;15 reps    Technical brewer Limitations  3  Hip Adduction Isometric  Strengthening;Both;1 set;10 reps    Bridges with [employer]  Strengthening;2 sets;10 reps      Knee/Hip Exercises: Sidelying   Hip ABduction  Strengthening;Both;1 set;15 reps      Ultrasound   Ultrasound Location  R lateral knee and patellar tendon     Ultrasound Parameters  1.2 W/cm2, 50%     Ultrasound Goals  Edema;Pain                  PT Long Term Goals - 03/12/19 1638      PT LONG TERM GOAL #1   Title  FOTO score will be 42% to demo increased functional mobility.    Status  On-going      PT LONG TERM GOAL #2   Title  Pt will be able to squat , lift 25 lbs without knee pain    Status  On-going      PT LONG TERM GOAL #3   Title  Pt will show strength in Rt knee to >/=4+/5 for normalize knee mechanics, support.    Status  On-going      PT LONG TERM GOAL #4   Title  Pt will be able to walk 20 min with pain no more than moderate pain 5/10 without brace    Status  On-going      PT LONG TERM GOAL #5   Title  Pt will be able to show Independence with HEP    Status  On-going            Plan - 03/12/19 1326     Clinical Impression Statement  Pt did well today, had difficulty with sidelying leg lifts.  Able to stand for 20-30 sec with knee slightly bent on Rt side.  She wonders why she is falling and we discussed possible reasons for this. She wears the brace when she is on her feet more. Goals in progress.    PT Treatment/Interventions  ADLs/Self Care Home Management;Therapeutic activities;Patient/family education;Taping;Therapeutic exercise;Cryotherapy;Ultrasound;Functional mobility training;Manual techniques;Passive range of motion;Iontophoresis 4mg /ml Dexamethasone;Moist Heat;Neuromuscular re-education;Gait training;Electrical Stimulation    PT Next Visit Plan  what did MD say  [place], ice and progress tolerance for standing, strength    PT Home Exercise Plan  quad set, bridge on heels, hamstring stretch, SLR, heel raises, standing    Consulted and Agree with Plan of Care  Patient       Patient will benefit from skilled therapeutic intervention in order to improve the following deficits and impairments:  Abnormal gait, Increased fascial restricitons, Pain, Decreased mobility, Postural dysfunction, Impaired flexibility, Decreased range of motion, Decreased strength, Decreased balance  Visit Diagnosis: Acute pain of right knee  Muscle weakness (generalized)  Difficulty in walking, not elsewhere classified     Problem List Patient Active Problem List   Diagnosis Date Noted  . Right knee pain 01/31/2019  . Nausea and vomiting 01/27/2019  . Trigger thumb of right hand 11/22/2018  . Anxiety 11/15/2018  . Abdominal pain 11/14/2018  . Dyspnea 11/14/2018  . Acute sinus infection 11/12/2018  . Constipation 11/12/2018  . Nonallopathic lesion of rib cage 12/21/2017  . Orthostasis 05/11/2017  . Cervicogenic headache 04/27/2017  . Nonallopathic lesion of cervical region 04/27/2017  . Rheumatoid factor positive 02/10/2016  . Myalgia and myositis 01/08/2016  . Low back pain 12/23/2015  . Left shoulder  pain 12/23/2015  . Nonallopathic lesion of lumbosacral region 07/29/2015  . Nonallopathic lesion of sacral region 07/29/2015  . Nonallopathic lesion of  thoracic region 07/29/2015  . Loss of weight 06/12/2014  . Dysphagia, pharyngoesophageal phase 06/12/2014  . Headache(784.0) 12/18/2012  . Recurrent falls 06/16/2012  . Chest pain 06/16/2012  . Back pain 03/06/2012  . Syncope and collapse 06/18/2011  . Fatigue 01/21/2011  . Microcytic anemia 01/21/2011  . Cervical radiculopathy 01/21/2011  . Preventative health care 10/11/2010  . Depression 01/09/2010  . LIBIDO, DECREASED 01/09/2010  . Migraine without aura 05/15/2009  . INTERMITTENT VERTIGO 05/15/2009  . INSOMNIA-SLEEP DISORDER-UNSPEC 01/04/2008  . Allergic rhinitis 10/04/2007  . Type 2 diabetes mellitus with hyperglycemia, with long-term current use of insulin ([place]) 08/02/2007  . Hyperlipidemia 08/02/2007  . Asthma 08/02/2007  . GERD 08/02/2007    Hannie Shoe 03/12/2019, 4:44 PM  [facility] [address] [place], [place], [address] Phone: [phone]   Fax:  [phone]  Name: Ayverie Fahr MRN: [id number] Date of Birth: July 20, 1965  Raeford Razor, PT 03/12/19 4:44 PM Phone: [phone] Fax: [phone]

## 2019-03-13 ENCOUNTER — Ambulatory Visit: Payer: PRIVATE HEALTH INSURANCE | Admitting: Physical Therapy

## 2019-03-14 ENCOUNTER — Ambulatory Visit: Payer: PRIVATE HEALTH INSURANCE | Admitting: Physical Therapy

## 2019-03-16 ENCOUNTER — Encounter

## 2019-03-21 ENCOUNTER — Encounter: Payer: Self-pay | Admitting: Physical Therapy

## 2019-03-21 ENCOUNTER — Ambulatory Visit: Payer: PRIVATE HEALTH INSURANCE | Attending: Internal Medicine | Admitting: Physical Therapy

## 2019-03-21 ENCOUNTER — Other Ambulatory Visit: Payer: Self-pay

## 2019-03-21 DIAGNOSIS — M6281 Muscle weakness (generalized): Secondary | ICD-10-CM | POA: Insufficient documentation

## 2019-03-21 DIAGNOSIS — M25561 Pain in right knee: Secondary | ICD-10-CM | POA: Insufficient documentation

## 2019-03-21 DIAGNOSIS — M7651 Patellar tendinitis, right knee: Secondary | ICD-10-CM | POA: Diagnosis present

## 2019-03-21 DIAGNOSIS — R262 Difficulty in walking, not elsewhere classified: Secondary | ICD-10-CM | POA: Diagnosis present

## 2019-03-21 NOTE — Therapy (Signed)
[place], [place], [address] Phone: [phone]   Fax:  [phone]  Physical Therapy Treatment  Patient Details  Name: Rachel [name] MRN: [id number] Date of Birth: 06/26/1965 Referring Provider (PT): Dr. Berenice Primas    Encounter Date: 03/21/2019  PT End of Session - 03/21/19 1359    Visit Number  4    Number of Visits  9    Date for PT Re-Evaluation  04/13/19    Authorization Type  WC    Authorization Time Period  8 treatments auth    Authorization - Visit Number  4    Authorization - Number of Visits  8    PT Start Time  [id number]    PT Stop Time  1443    PT Time Calculation (min)  44 min    Activity Tolerance  Patient tolerated treatment well    Behavior During Therapy  [facility] for tasks assessed/performed       Past Medical History:  Diagnosis Date  . ALLERGIC RHINITIS 10/04/2007  . Anemia 01/21/2011  . ASTHMA 08/02/2007   INHALERS ONLY IN [place]  . Blood transfusion without reported diagnosis    with first child   . COMMON MIGRAINE 05/15/2009  . DIABETES MELLITUS, TYPE II 08/02/2007  . GERD 08/02/2007  . HYPERLIPIDEMIA 08/02/2007  . INSOMNIA-SLEEP DISORDER-UNSPEC 01/04/2008  . INTERMITTENT VERTIGO 05/15/2009  . LIBIDO, DECREASED 01/09/2010    Past Surgical History:  Procedure Laterality Date  . CESAREAN SECTION     x 3  . CYST EXCISION     left knee  . PAROTIDECTOMY  10/21/2011   Procedure: PAROTIDECTOMY;  Surgeon: Melida Quitter, MD;  Location: [facility];  Service: ENT;  Laterality: Left;    There were no vitals filed for this visit.  Subjective Assessment - 03/21/19 1400    Subjective  MD recommended she use a patellar strap - not sure if it is helping.  Now the Lt knee feels like it has fluid in it and she wants to see her own MD. Both knees are bothering her. MD told her we should be doing more than just ultrasound    Patient Stated Goals  Return to work, walking 30 min, do my routine    Currently in  Pain?  Yes    Pain Score  7     Pain Location  Knee    Pain Orientation  Left;Right    Pain Descriptors / Indicators  Burning;Stabbing    Pain Type  Acute pain    Pain Frequency  Constant    Aggravating Factors   walking standing    Pain Relieving Factors  meds, ice                       [facility] Adult PT Treatment/Exercise - 03/21/19 0001      Knee/Hip Exercises: Stretches   Sports administrator  Both;2 reps;60 seconds   prone with strap     Knee/Hip Exercises: Aerobic   Nustep  5 min L6 UE and LE       Knee/Hip Exercises: Seated   Long Arc Quad  Strengthening;Both;2 sets;10 reps   focus on eccentric lowering   Long Arc Quad Weight  5 lbs.    Long Arc Quad Limitations  crepitus on Rt side.       Modalities   Modalities  Ultrasound;Iontophoresis      Ultrasound   Ultrasound Location  Rt lateral knee/patellar tendon  Ultrasound Parameters  static 3', 0.80w/cm2, 1.46mHz    Ultrasound Goals  Edema;Pain      Iontophoresis   Type of Iontophoresis  Dexamethasone    Location  Rt patellar tendon/lateral knee    Dose  1.0cc    Time  6 hr stat patch                  PT Long Term Goals - 03/12/19 1638      PT LONG TERM GOAL #1   Title  FOTO score will be 42% to demo increased functional mobility.    Status  On-going      PT LONG TERM GOAL #2   Title  Pt will be able to squat , lift 25 lbs without knee pain    Status  On-going      PT LONG TERM GOAL #3   Title  Pt will show strength in Rt knee to >/=4+/5 for normalize knee mechanics, support.    Status  On-going      PT LONG TERM GOAL #4   Title  Pt will be able to walk 20 min with pain no more than moderate pain 5/10 without brace    Status  On-going      PT LONG TERM GOAL #5   Title  Pt will be able to show Independence with HEP    Status  On-going            Plan - 03/21/19 1418    Clinical Impression Statement  Pt expresses frustration with her lack of progress.  She has only been seen  for 4 visits, it was explained that PT and recovery can take time.  She hasn't had enough consistent therapy yet to see a big improvement.  She is concerned about her Lt knee and the fact that no one seems to be listening to her.  Rt patellar tendon is slightly tender, no increase in pain with eccentric loading.  Iontophoresis was added to her treatment to assist with reducing pain and inflamation around the Rt patellar tendon.    PT Duration  4 weeks    PT Treatment/Interventions  ADLs/Self Care Home Management;Therapeutic activities;Patient/family education;Taping;Therapeutic exercise;Cryotherapy;Ultrasound;Functional mobility training;Manual techniques;Passive range of motion;Iontophoresis 4mg /ml Dexamethasone;Moist Heat;Neuromuscular re-education;Gait training;Electrical Stimulation    PT Next Visit Plan  assess response to ionto for patellar tendonitis Rt knee.    Consulted and Agree with Plan of Care  Patient       Patient will benefit from skilled therapeutic intervention in order to improve the following deficits and impairments:  Abnormal gait, Increased fascial restricitons, Pain, Decreased mobility, Postural dysfunction, Impaired flexibility, Decreased range of motion, Decreased strength, Decreased balance  Visit Diagnosis: Acute pain of right knee  Muscle weakness (generalized)  Difficulty in walking, not elsewhere classified  Patellar tendinitis of right knee     Problem List Patient Active Problem List   Diagnosis Date Noted  . Right knee pain 01/31/2019  . Nausea and vomiting 01/27/2019  . Trigger thumb of right hand 11/22/2018  . Anxiety 11/15/2018  . Abdominal pain 11/14/2018  . Dyspnea 11/14/2018  . Acute sinus infection 11/12/2018  . Constipation 11/12/2018  . Nonallopathic lesion of rib cage 12/21/2017  . Orthostasis 05/11/2017  . Cervicogenic headache 04/27/2017  . Nonallopathic lesion of cervical region 04/27/2017  . Rheumatoid factor positive 02/10/2016  .  Myalgia and myositis 01/08/2016  . Low back pain 12/23/2015  . Left shoulder pain 12/23/2015  . Nonallopathic lesion of lumbosacral region  07/29/2015  . Nonallopathic lesion of sacral region 07/29/2015  . Nonallopathic lesion of thoracic region 07/29/2015  . Loss of weight 06/12/2014  . Dysphagia, pharyngoesophageal phase 06/12/2014  . Headache(784.0) 12/18/2012  . Recurrent falls 06/16/2012  . Chest pain 06/16/2012  . Back pain 03/06/2012  . Syncope and collapse 06/18/2011  . Fatigue 01/21/2011  . Microcytic anemia 01/21/2011  . Cervical radiculopathy 01/21/2011  . Preventative health care 10/11/2010  . Depression 01/09/2010  . LIBIDO, DECREASED 01/09/2010  . Migraine without aura 05/15/2009  . INTERMITTENT VERTIGO 05/15/2009  . INSOMNIA-SLEEP DISORDER-UNSPEC 01/04/2008  . Allergic rhinitis 10/04/2007  . Type 2 diabetes mellitus with hyperglycemia, with long-term current use of insulin ([place]) 08/02/2007  . Hyperlipidemia 08/02/2007  . Asthma 08/02/2007  . GERD 08/02/2007    Jeral Pinch PT  03/21/2019, 4:23 PM  [facility] [address] [place], [place], [address] Phone: [phone]   Fax:  [phone]  Name: Rachel [name] MRN: [id number] Date of Birth: 06-May-1966

## 2019-03-21 NOTE — Patient Instructions (Addendum)
IONTOPHORESIS PATIENT PRECAUTIONS & CONTRAINDICATIONS:  . Redness under one or both electrodes can occur.  This characterized by a uniform redness that usually disappears within 12 hours of treatment. . Small pinhead size blisters may result in response to the drug.  Contact your physician if the problem persists more than 24 hours. . On rare occasions, iontophoresis therapy can result in temporary skin reactions such as rash, inflammation, irritation or burns.  The skin reactions may be the result of individual sensitivity to the ionic solution used, the condition of the skin at the start of treatment, reaction to the materials in the electrodes, allergies or sensitivity to dexamethasone, or a poor connection between the patch and your skin.  Discontinue using iontophoresis if you have any of these reactions and report to your therapist. . Remove the Patch or electrodes if you have any undue sensation of pain or burning during the treatment and report discomfort to your therapist. . Tell your Therapist if you have had known adverse reactions to the application of electrical current.  Remove the patch after 6 hours. . The Patch can be worn during normal activity, however excessive motion where the electrodes have been placed can cause poor contact between the skin and the electrode or uneven electrical current resulting in greater risk of skin irritation. Marland Kitchen Keep out of the reach of children.   . DO NOT use if you have a cardiac pacemaker or any other electrically sensitive implanted device. . DO NOT use if you have a known sensitivity to dexamethasone. . DO NOT use during Magnetic Resonance Imaging (MRI). . DO NOT use over broken or compromised skin (e.g. sunburn, cuts, or acne) due to the increased risk of skin reaction. . DO NOT SHAVE over the area to be treated:  To establish good contact between the Patch and the skin, excessive hair may be clipped. . DO NOT place the Patch or electrodes on  or over your eyes, directly over your heart, or brain. . DO NOT reuse the Patch or electrodes as this may cause burns to occur.

## 2019-03-23 ENCOUNTER — Other Ambulatory Visit: Payer: Self-pay

## 2019-03-23 ENCOUNTER — Ambulatory Visit: Payer: PRIVATE HEALTH INSURANCE | Admitting: Physical Therapy

## 2019-03-23 DIAGNOSIS — M6281 Muscle weakness (generalized): Secondary | ICD-10-CM

## 2019-03-23 DIAGNOSIS — M7651 Patellar tendinitis, right knee: Secondary | ICD-10-CM

## 2019-03-23 DIAGNOSIS — M25561 Pain in right knee: Secondary | ICD-10-CM

## 2019-03-23 DIAGNOSIS — R262 Difficulty in walking, not elsewhere classified: Secondary | ICD-10-CM

## 2019-03-23 NOTE — Therapy (Signed)
[place], [place], [address] Phone: [phone]   Fax:  [phone]  Physical Therapy Treatment  Patient Details  Name: Rachel [name] MRN: [id number] Date of Birth: 12-27-1965 Referring Provider (PT): Dr. Berenice Primas    Encounter Date: 03/23/2019  PT End of Session - 03/23/19 1110    Visit Number  5    Number of Visits  9    Date for PT Re-Evaluation  04/13/19    Authorization Type  WC    Authorization Time Period  8 treatments auth    Authorization - Visit Number  4    Authorization - Number of Visits  8    PT Start Time  1104    PT Stop Time  1140    PT Time Calculation (min)  36 min    Activity Tolerance  Patient tolerated treatment well    Behavior During Therapy  [facility] for tasks assessed/performed       Past Medical History:  Diagnosis Date  . ALLERGIC RHINITIS 10/04/2007  . Anemia 01/21/2011  . ASTHMA 08/02/2007   INHALERS ONLY IN [place]  . Blood transfusion without reported diagnosis    with first child   . COMMON MIGRAINE 05/15/2009  . DIABETES MELLITUS, TYPE II 08/02/2007  . GERD 08/02/2007  . HYPERLIPIDEMIA 08/02/2007  . INSOMNIA-SLEEP DISORDER-UNSPEC 01/04/2008  . INTERMITTENT VERTIGO 05/15/2009  . LIBIDO, DECREASED 01/09/2010    Past Surgical History:  Procedure Laterality Date  . CESAREAN SECTION     x 3  . CYST EXCISION     left knee  . PAROTIDECTOMY  10/21/2011   Procedure: PAROTIDECTOMY;  Surgeon: Melida Quitter, MD;  Location: [facility];  Service: ENT;  Laterality: Left;    There were no vitals filed for this visit.                    [facility] Adult PT Treatment/Exercise - 03/23/19 1152      Knee/Hip Exercises: Aerobic   Nustep  5 min L6 UE and LE       Knee/Hip Exercises: Supine   Quad Sets Limitations  2x10    Short Arc Quad Sets Limitations  2x10     Bridges Limitations  2x10     Straight Leg Raises Limitations  2x10       Manual Therapy   Manual Therapy   Taping    Insurance account manager;Facilitate Muscle      [employer]   Create Space  patella tendon tape with 50% sttretch     Facilitate Muscle   Patella tendin area Y tape to the quad                   PT Long Term Goals - 03/12/19 1638      PT LONG TERM GOAL #1   Title  FOTO score will be 42% to demo increased functional mobility.    Status  On-going      PT LONG TERM GOAL #2   Title  Pt will be able to squat , lift 25 lbs without knee pain    Status  On-going      PT LONG TERM GOAL #3   Title  Pt will show strength in Rt knee to >/=4+/5 for normalize knee mechanics, support.    Status  On-going      PT LONG TERM GOAL #4   Title  Pt will be able to walk 20 min with pain no  more than moderate pain 5/10 without brace    Status  On-going      PT LONG TERM GOAL #5   Title  Pt will be able to show Independence with HEP    Status  On-going            Plan - 03/23/19 1149    Clinical Impression Statement  Therapy perfroemd kinesio taping for patellar tendinitis. She was afdvised at proper doning and doffing of kinesio tape. She reported that she is having pain with heel slides at home. She was advised to stop heel slides. She is doing her other exercises. She tolerated all other exercises well.    Personal Factors and Comorbidities  Behavior Pattern;Comorbidity 1;Profession    Comorbidities  diabetes, depression    Examination-Activity Limitations  Squat;Stairs;Lift;Bend;Stand;Sleep;Transfers    Stability/Clinical Decision Making  Stable/Uncomplicated    Clinical Decision Making  Moderate    Rehab Potential  Excellent    PT Frequency  4x / week    PT Duration  4 weeks    PT Treatment/Interventions  ADLs/Self Care Home Management;Therapeutic activities;Patient/family education;Taping;Therapeutic exercise;Cryotherapy;Ultrasound;Functional mobility training;Manual techniques;Passive range of motion;Iontophoresis 4mg /ml Dexamethasone;Moist Heat;Neuromuscular  re-education;Gait training;Electrical Stimulation    PT Next Visit Plan  assess response to ionto for patellar tendonitis Rt knee.    PT Home Exercise Plan  quad set, bridge on heels, hamstring stretch, SLR, heel raises, standing    Consulted and Agree with Plan of Care  Patient       Patient will benefit from skilled therapeutic intervention in order to improve the following deficits and impairments:  Abnormal gait, Increased fascial restricitons, Pain, Decreased mobility, Postural dysfunction, Impaired flexibility, Decreased range of motion, Decreased strength, Decreased balance  Visit Diagnosis: Acute pain of right knee  Muscle weakness (generalized)  Difficulty in walking, not elsewhere classified  Patellar tendinitis of right knee     Problem List Patient Active Problem List   Diagnosis Date Noted  . Right knee pain 01/31/2019  . Nausea and vomiting 01/27/2019  . Trigger thumb of right hand 11/22/2018  . Anxiety 11/15/2018  . Abdominal pain 11/14/2018  . Dyspnea 11/14/2018  . Acute sinus infection 11/12/2018  . Constipation 11/12/2018  . Nonallopathic lesion of rib cage 12/21/2017  . Orthostasis 05/11/2017  . Cervicogenic headache 04/27/2017  . Nonallopathic lesion of cervical region 04/27/2017  . Rheumatoid factor positive 02/10/2016  . Myalgia and myositis 01/08/2016  . Low back pain 12/23/2015  . Left shoulder pain 12/23/2015  . Nonallopathic lesion of lumbosacral region 07/29/2015  . Nonallopathic lesion of sacral region 07/29/2015  . Nonallopathic lesion of thoracic region 07/29/2015  . Loss of weight 06/12/2014  . Dysphagia, pharyngoesophageal phase 06/12/2014  . Headache(784.0) 12/18/2012  . Recurrent falls 06/16/2012  . Chest pain 06/16/2012  . Back pain 03/06/2012  . Syncope and collapse 06/18/2011  . Fatigue 01/21/2011  . Microcytic anemia 01/21/2011  . Cervical radiculopathy 01/21/2011  . Preventative health care 10/11/2010  . Depression  01/09/2010  . LIBIDO, DECREASED 01/09/2010  . Migraine without aura 05/15/2009  . INTERMITTENT VERTIGO 05/15/2009  . INSOMNIA-SLEEP DISORDER-UNSPEC 01/04/2008  . Allergic rhinitis 10/04/2007  . Type 2 diabetes mellitus with hyperglycemia, with long-term current use of insulin ([place]) 08/02/2007  . Hyperlipidemia 08/02/2007  . Asthma 08/02/2007  . GERD 08/02/2007    Carney Living PT DPT  03/23/2019, 11:56 AM  [facility] [address] [place], [place], [address] Phone: [phone]   Fax:  [phone]  Name: Rachel [name] MRN: [id number] Date of Birth: 10-16-1965

## 2019-03-28 ENCOUNTER — Other Ambulatory Visit: Payer: Self-pay

## 2019-03-28 ENCOUNTER — Ambulatory Visit: Payer: PRIVATE HEALTH INSURANCE | Admitting: Physical Therapy

## 2019-03-28 ENCOUNTER — Encounter: Payer: Self-pay | Admitting: Physical Therapy

## 2019-03-28 DIAGNOSIS — M6281 Muscle weakness (generalized): Secondary | ICD-10-CM

## 2019-03-28 DIAGNOSIS — M7651 Patellar tendinitis, right knee: Secondary | ICD-10-CM

## 2019-03-28 DIAGNOSIS — R262 Difficulty in walking, not elsewhere classified: Secondary | ICD-10-CM

## 2019-03-28 DIAGNOSIS — M25561 Pain in right knee: Secondary | ICD-10-CM | POA: Diagnosis not present

## 2019-03-28 NOTE — Therapy (Signed)
[place], [place], [address] Phone: [phone]   Fax:  [phone]  Physical Therapy Treatment  Patient Details  Name: Rachel [name] MRN: [id number] Date of Birth: 1965/11/24 Referring Provider (PT): Dr. Berenice Primas    Encounter Date: 03/28/2019  PT End of Session - 03/28/19 1250    Visit Number  6    Number of Visits  9    Date for PT Re-Evaluation  04/13/19    Authorization Type  WC    Authorization Time Period  8 treatments auth    Authorization - Visit Number  5    Authorization - Number of Visits  8    PT Start Time  1232    PT Stop Time  1315    PT Time Calculation (min)  43 min    Activity Tolerance  Patient tolerated treatment well    Behavior During Therapy  [facility] for tasks assessed/performed       Past Medical History:  Diagnosis Date  . ALLERGIC RHINITIS 10/04/2007  . Anemia 01/21/2011  . ASTHMA 08/02/2007   INHALERS ONLY IN [place]  . Blood transfusion without reported diagnosis    with first child   . COMMON MIGRAINE 05/15/2009  . DIABETES MELLITUS, TYPE II 08/02/2007  . GERD 08/02/2007  . HYPERLIPIDEMIA 08/02/2007  . INSOMNIA-SLEEP DISORDER-UNSPEC 01/04/2008  . INTERMITTENT VERTIGO 05/15/2009  . LIBIDO, DECREASED 01/09/2010    Past Surgical History:  Procedure Laterality Date  . CESAREAN SECTION     x 3  . CYST EXCISION     left knee  . PAROTIDECTOMY  10/21/2011   Procedure: PAROTIDECTOMY;  Surgeon: Melida Quitter, MD;  Location: [place];  Service: ENT;  Laterality: Left;    There were no vitals filed for this visit.  Subjective Assessment - 03/28/19 1238    Subjective  The tape worked a few days.  Rt knee pain now.  Lt knee 7/10 .  Last night was very painful trying to sleep.    Currently in Pain?  Yes    Pain Score  7     Pain Location  Knee    Pain Orientation  Right    Pain Descriptors / Indicators  Burning;Stabbing    Pain Type  Acute pain    Pain Onset  More than a month ago     Pain Frequency  Constant    Aggravating Factors   walking  but really is all the time         [facility] Adult PT Treatment/Exercise - 03/28/19 0001      Knee/Hip Exercises: Stretches   Active Hamstring Stretch  Right;3 reps    Development worker, international aid Limitations  --      Knee/Hip Exercises: Aerobic   Nustep  6 min L6 then back down to 1 due to fatigue       Knee/Hip Exercises: Standing   Hip Abduction  Stengthening;Both;1 set;10 reps    Abduction Limitations  on step facing away from wall with 1 UE     Wall Squat  1 set;10 reps    Other Standing Knee Exercises  glute med isometrric into wall x 8       Knee/Hip Exercises: Supine   Bridges with [employer]  Strengthening;Both;2 sets;10 reps    Straight Leg Raises Limitations  2x10     Straight Leg Raise with External Rotation Limitations  2 x 10  Iontophoresis   Type of Iontophoresis  Dexamethasone    Location  Rt patellar tendon/lateral knee    Dose  1.0cc    Time  6 hr stat patch      Kinesiotix   Create Space  Patella, just 1 Y so patch could be applied          PT Long Term Goals - 03/12/19 1638      PT LONG TERM GOAL #1   Title  FOTO score will be 42% to demo increased functional mobility.    Status  On-going      PT LONG TERM GOAL #2   Title  Pt will be able to squat , lift 25 lbs without knee pain    Status  On-going      PT LONG TERM GOAL #3   Title  Pt will show strength in Rt knee to >/=4+/5 for normalize knee mechanics, support.    Status  On-going      PT LONG TERM GOAL #4   Title  Pt will be able to walk 20 min with pain no more than moderate pain 5/10 without brace    Status  On-going      PT LONG TERM GOAL #5   Title  Pt will be able to show Independence with HEP    Status  On-going            Plan - 03/28/19 1251    Clinical Impression Statement  Patient did have relief a few days with kinesiotape.  She tolerated exercises well today.  No noted improvment in symptoms at  this time. Nauseous post session.    PT Treatment/Interventions  ADLs/Self Care Home Management;Therapeutic activities;Patient/family education;Taping;Therapeutic exercise;Cryotherapy;Ultrasound;Functional mobility training;Manual techniques;Passive range of motion;Iontophoresis 4mg /ml Dexamethasone;Moist Heat;Neuromuscular re-education;Gait training;Electrical Stimulation    PT Next Visit Plan  cont patch and/or tape? hip strengthening, has less pain when knees are extended (SLR, standing hip)    PT Home Exercise Plan  quad set, bridge on heels, hamstring stretch, SLR, heel raises, standing    Consulted and Agree with Plan of Care  Patient       Patient will benefit from skilled therapeutic intervention in order to improve the following deficits and impairments:  Abnormal gait, Increased fascial restricitons, Pain, Decreased mobility, Postural dysfunction, Impaired flexibility, Decreased range of motion, Decreased strength, Decreased balance  Visit Diagnosis: Acute pain of right knee  Muscle weakness (generalized)  Difficulty in walking, not elsewhere classified  Patellar tendinitis of right knee     Problem List Patient Active Problem List   Diagnosis Date Noted  . Right knee pain 01/31/2019  . Nausea and vomiting 01/27/2019  . Trigger thumb of right hand 11/22/2018  . Anxiety 11/15/2018  . Abdominal pain 11/14/2018  . Dyspnea 11/14/2018  . Acute sinus infection 11/12/2018  . Constipation 11/12/2018  . Nonallopathic lesion of rib cage 12/21/2017  . Orthostasis 05/11/2017  . Cervicogenic headache 04/27/2017  . Nonallopathic lesion of cervical region 04/27/2017  . Rheumatoid factor positive 02/10/2016  . Myalgia and myositis 01/08/2016  . Low back pain 12/23/2015  . Left shoulder pain 12/23/2015  . Nonallopathic lesion of lumbosacral region 07/29/2015  . Nonallopathic lesion of sacral region 07/29/2015  . Nonallopathic lesion of thoracic region 07/29/2015  . Loss of weight  06/12/2014  . Dysphagia, pharyngoesophageal phase 06/12/2014  . Headache(784.0) 12/18/2012  . Recurrent falls 06/16/2012  . Chest pain 06/16/2012  . Back pain 03/06/2012  . Syncope and collapse 06/18/2011  .  Fatigue 01/21/2011  . Microcytic anemia 01/21/2011  . Cervical radiculopathy 01/21/2011  . Preventative health care 10/11/2010  . Depression 01/09/2010  . LIBIDO, DECREASED 01/09/2010  . Migraine without aura 05/15/2009  . INTERMITTENT VERTIGO 05/15/2009  . INSOMNIA-SLEEP DISORDER-UNSPEC 01/04/2008  . Allergic rhinitis 10/04/2007  . Type 2 diabetes mellitus with hyperglycemia, with long-term current use of insulin ([place]) 08/02/2007  . Hyperlipidemia 08/02/2007  . Asthma 08/02/2007  . GERD 08/02/2007    Dawna Jakes 03/28/2019, 1:16 PM  [facility] [address] [place], [place], [address] Phone: [phone]   Fax:  [phone]  Name: Chloemarie Swatsworth MRN: [id number] Date of Birth: 1965-08-03  Raeford Razor, PT 03/28/19 1:16 PM Phone: [phone] Fax: [phone]

## 2019-03-30 ENCOUNTER — Ambulatory Visit: Payer: PRIVATE HEALTH INSURANCE | Admitting: Physical Therapy

## 2019-03-30 ENCOUNTER — Other Ambulatory Visit: Payer: Self-pay

## 2019-03-30 ENCOUNTER — Encounter: Payer: Self-pay | Admitting: Physical Therapy

## 2019-03-30 DIAGNOSIS — M6281 Muscle weakness (generalized): Secondary | ICD-10-CM

## 2019-03-30 DIAGNOSIS — R262 Difficulty in walking, not elsewhere classified: Secondary | ICD-10-CM

## 2019-03-30 DIAGNOSIS — M25561 Pain in right knee: Secondary | ICD-10-CM

## 2019-03-30 DIAGNOSIS — M7651 Patellar tendinitis, right knee: Secondary | ICD-10-CM

## 2019-03-30 NOTE — Therapy (Signed)
[name], [place], [address] Phone: [phone]   Fax:  [phone]  Physical Therapy Treatment  Patient Details  Name: Rachel [name] MRN: [id number] Date of Birth: [birth year] Referring Provider (PT): Dr. Berenice Primas    Encounter Date: 03/30/2019  PT End of Session - 03/30/19 1012    Visit Number  7    Number of Visits  9    Date for PT Re-Evaluation  04/13/19    Authorization Type  WC    Authorization - Visit Number  6    Authorization - Number of Visits  8    PT Start Time  1004    PT Stop Time  1044    PT Time Calculation (min)  40 min    Activity Tolerance  Patient limited by pain    Behavior During Therapy  [facility] for tasks assessed/performed       Past Medical History:  Diagnosis Date  . ALLERGIC RHINITIS 10/04/2007  . Anemia 01/21/2011  . ASTHMA 08/02/2007   INHALERS ONLY IN [place]  . Blood transfusion without reported diagnosis    with first child   . COMMON MIGRAINE 05/15/2009  . DIABETES MELLITUS, TYPE II 08/02/2007  . GERD 08/02/2007  . HYPERLIPIDEMIA 08/02/2007  . INSOMNIA-SLEEP DISORDER-UNSPEC 01/04/2008  . INTERMITTENT VERTIGO 05/15/2009  . LIBIDO, DECREASED 01/09/2010    Past Surgical History:  Procedure Laterality Date  . CESAREAN SECTION     x 3  . CYST EXCISION     left knee  . PAROTIDECTOMY  10/21/2011   Procedure: PAROTIDECTOMY;  Surgeon: Melida Quitter, MD;  Location: [place];  Service: ENT;  Laterality: Left;    There were no vitals filed for this visit.  Subjective Assessment - 03/30/19 1005    Subjective  I am in such pain right now.  the tramadol is not helping yet.    Currently in Pain?  Yes    Pain Score  6     Pain Location  Knee    Pain Orientation  Right    Pain Descriptors / Indicators  Stabbing    Pain Type  Acute pain    Pain Onset  More than a month ago    Pain Frequency  Constant    Aggravating Factors   standing    Pain Relieving Factors  meds, ice , Voltaren          [facility] Adult PT Treatment/Exercise - 03/30/19 0001      Knee/Hip Exercises: Stretches   Active Hamstring Stretch  Right;3 reps    Knee: Self-Stretch to increase Flexion  Right;5 reps      Knee/Hip Exercises: Machines for Strengthening   Cybex Leg Press  1 plate x 20    45 lbs     Knee/Hip Exercises: Supine   Quad Sets  Strengthening;Both;1 set;20 reps      Knee/Hip Exercises: Sidelying   Other Sidelying Knee/Hip Exercises  4 way hip strength    x 10 each leg      Cryotherapy   Number Minutes Cryotherapy  10 Minutes    Cryotherapy Location  Knee    Type of Cryotherapy  Ice pack      Electrical Stimulation   Electrical Stimulation Location  Rt Knee ant     Electrical Stimulation Action  IFC     Electrical Stimulation Parameters  to tol (11)     Electrical Stimulation Goals  Pain  PT Education - 03/30/19 1011    Education Details  IFC for pain relief    Person(s) Educated  Patient    Methods  Explanation    Comprehension  Verbalized understanding          PT Long Term Goals - 03/12/19 1638      PT LONG TERM GOAL #1   Title  FOTO score will be 42% to demo increased functional mobility.    Status  On-going      PT LONG TERM GOAL #2   Title  Pt will be able to squat , lift 25 lbs without knee pain    Status  On-going      PT LONG TERM GOAL #3   Title  Pt will show strength in Rt knee to >/=4+/5 for normalize knee mechanics, support.    Status  On-going      PT LONG TERM GOAL #4   Title  Pt will be able to walk 20 min with pain no more than moderate pain 5/10 without brace    Status  On-going      PT LONG TERM GOAL #5   Title  Pt will be able to show Independence with HEP    Status  On-going            Plan - 03/30/19 1013    Clinical Impression Statement  Pt limited by pain today.  Her Rt leg is starting to bother her as well, walking up stairs.  Used IFC for pain initially.  Urged her to cont working on her knee strength  and flexibility as able and finsh POC, 1 time per week next 2 weeks.    PT Frequency  2x / week    PT Duration  4 weeks    PT Treatment/Interventions  ADLs/Self Care Home Management;Therapeutic activities;Patient/family education;Taping;Therapeutic exercise;Cryotherapy;Ultrasound;Functional mobility training;Manual techniques;Passive range of motion;Iontophoresis 4mg /ml Dexamethasone;Moist Heat;Neuromuscular re-education;Gait training;Electrical Stimulation    PT Next Visit Plan  cont patch and/or tape? hip strengthening, has less pain when knees are extended (SLR, standing hip)    PT Home Exercise Plan  quad set, bridge on heels, hamstring stretch, SLR, heel raises, standing    Consulted and Agree with Plan of Care  Patient       Patient will benefit from skilled therapeutic intervention in order to improve the following deficits and impairments:  Abnormal gait, Increased fascial restricitons, Pain, Decreased mobility, Postural dysfunction, Impaired flexibility, Decreased range of motion, Decreased strength, Decreased balance  Visit Diagnosis: Acute pain of right knee  Muscle weakness (generalized)  Difficulty in walking, not elsewhere classified  Patellar tendinitis of right knee     Problem List Patient Active Problem List   Diagnosis Date Noted  . Right knee pain 01/31/2019  . Nausea and vomiting 01/27/2019  . Trigger thumb of right hand 11/22/2018  . Anxiety 11/15/2018  . Abdominal pain 11/14/2018  . Dyspnea 11/14/2018  . Acute sinus infection 11/12/2018  . Constipation 11/12/2018  . Nonallopathic lesion of rib cage 12/21/2017  . Orthostasis 05/11/2017  . Cervicogenic headache 04/27/2017  . Nonallopathic lesion of cervical region 04/27/2017  . Rheumatoid factor positive 02/10/2016  . Myalgia and myositis 01/08/2016  . Low back pain 12/23/2015  . Left shoulder pain 12/23/2015  . Nonallopathic lesion of lumbosacral region 07/29/2015  . Nonallopathic lesion of sacral  region 07/29/2015  . Nonallopathic lesion of thoracic region 07/29/2015  . Loss of weight 06/12/2014  . Dysphagia, pharyngoesophageal phase 06/12/2014  . Headache(784.0) 12/18/2012  .  Recurrent falls 06/16/2012  . Chest pain 06/16/2012  . Back pain 03/06/2012  . Syncope and collapse 06/18/2011  . Fatigue 01/21/2011  . Microcytic anemia 01/21/2011  . Cervical radiculopathy 01/21/2011  . Preventative health care 10/11/2010  . Depression 01/09/2010  . LIBIDO, DECREASED 01/09/2010  . Migraine without aura 05/15/2009  . INTERMITTENT VERTIGO 05/15/2009  . INSOMNIA-SLEEP DISORDER-UNSPEC 01/04/2008  . Allergic rhinitis 10/04/2007  . Type 2 diabetes mellitus with hyperglycemia, with long-term current use of insulin ([place]) 08/02/2007  . Hyperlipidemia 08/02/2007  . Asthma 08/02/2007  . GERD 08/02/2007    , 03/30/2019, 11:09 AM  [facility] [address] [place], [place], [address] Phone: [phone]   Fax:  [phone]  Name: Rylinn Hishmeh MRN: [id number] Date of Birth: 03-20-1966  Raeford Razor, PT 03/30/19 11:10 AM Phone: [phone] Fax: [phone]

## 2019-03-31 ENCOUNTER — Ambulatory Visit
Admission: RE | Admit: 2019-03-31 | Discharge: 2019-03-31 | Disposition: A | Discharge status: Home / Self Care | Payer: No Typology Code available for payment source | Source: Ambulatory Visit | Attending: Family Medicine | Admitting: Family Medicine

## 2019-03-31 DIAGNOSIS — M542 Cervicalgia: Secondary | ICD-10-CM

## 2019-03-31 DIAGNOSIS — G8929 Other chronic pain: Secondary | ICD-10-CM

## 2019-04-04 ENCOUNTER — Telehealth: Payer: Self-pay | Admitting: *Deleted

## 2019-04-04 ENCOUNTER — Other Ambulatory Visit: Payer: Self-pay | Admitting: Internal Medicine

## 2019-04-04 ENCOUNTER — Other Ambulatory Visit: Payer: Self-pay | Admitting: Neurology

## 2019-04-04 ENCOUNTER — Ambulatory Visit: Payer: PRIVATE HEALTH INSURANCE | Admitting: Physical Therapy

## 2019-04-04 ENCOUNTER — Other Ambulatory Visit: Payer: Self-pay

## 2019-04-04 DIAGNOSIS — R262 Difficulty in walking, not elsewhere classified: Secondary | ICD-10-CM

## 2019-04-04 DIAGNOSIS — M25561 Pain in right knee: Secondary | ICD-10-CM

## 2019-04-04 DIAGNOSIS — M6281 Muscle weakness (generalized): Secondary | ICD-10-CM

## 2019-04-04 DIAGNOSIS — M7651 Patellar tendinitis, right knee: Secondary | ICD-10-CM

## 2019-04-04 NOTE — Telephone Encounter (Signed)
Done erx 

## 2019-04-04 NOTE — Telephone Encounter (Addendum)
This is the responsibility for the patient/employee to report to Employee health. I can print her immunization record for her to be mailed or to pick up. Left detailed message informing pt of this and to let me know how she would like to receive her immunization report.

## 2019-04-04 NOTE — Therapy (Addendum)
[place], [place], [address] Phone: [phone]   Fax:  [phone]  Physical Therapy Treatment/Discharge  Patient Details  Name: Rachel [name] MRN: [id number] Date of Birth: [birth year] Referring Provider (PT): Dr. Berenice Primas    Encounter Date: 04/04/2019  PT End of Session - 04/04/19 0848    Visit Number  8    Number of Visits  9    Date for PT Re-Evaluation  04/13/19    Authorization Type  WC    Authorization Time Period  8 treatments auth    Authorization - Number of Visits  8    PT Start Time  0845    PT Stop Time  0925    PT Time Calculation (min)  40 min    Activity Tolerance  Patient limited by pain    Behavior During Therapy  [facility] for tasks assessed/performed       Past Medical History:  Diagnosis Date  . ALLERGIC RHINITIS 10/04/2007  . Anemia 01/21/2011  . ASTHMA 08/02/2007   INHALERS ONLY IN [place]  . Blood transfusion without reported diagnosis    with first child   . COMMON MIGRAINE 05/15/2009  . DIABETES MELLITUS, TYPE II 08/02/2007  . GERD 08/02/2007  . HYPERLIPIDEMIA 08/02/2007  . INSOMNIA-SLEEP DISORDER-UNSPEC 01/04/2008  . INTERMITTENT VERTIGO 05/15/2009  . LIBIDO, DECREASED 01/09/2010    Past Surgical History:  Procedure Laterality Date  . CESAREAN SECTION     x 3  . CYST EXCISION     left knee  . PAROTIDECTOMY  10/21/2011   Procedure: PAROTIDECTOMY;  Surgeon: Melida Quitter, MD;  Location: [place];  Service: ENT;  Laterality: Left;    There were no vitals filed for this visit.  Subjective Assessment - 04/04/19 0917    Subjective  Patient reports her knee is about the same. She could not sleep last night because of the pain. She had to take several pills. She has continued to work Retail buyer exercises.    Pertinent History  headachesa and backaches are worse since fall, diabetes    Limitations  Standing;House hold activities;Walking;Other (comment);Lifting    How long can you sit  comfortably?  props leg , painful    How long can you stand comfortably?  stands at work for many hours , currently on light duty . not comfortable > 10 min    How long can you walk comfortably?  10 min or less    Diagnostic tests  MRI showed mild degeneration of laterla meniscus    Patient Stated Goals  Return to work, walking 30 min, do my routine    Currently in Pain?  Yes    Pain Score  6     Pain Location  Knee    Pain Orientation  Right    Pain Descriptors / Indicators  Aching    Pain Type  Acute pain    Pain Onset  More than a month ago    Pain Frequency  Constant    Aggravating Factors   standing, meds, ice    Effect of Pain on Daily Activities  unable to work                       [facility] Adult PT Treatment/Exercise - 04/04/19 0001      Knee/Hip Exercises: Stretches   Active Hamstring Stretch  Right;3 reps      Knee/Hip Exercises: Aerobic   Nustep  5 min L2  Knee/Hip Exercises: Standing   Heel Raises Limitations  2x10     Other Standing Knee Exercises  standing march 2x10       Knee/Hip Exercises: Supine   Quad Sets  Strengthening;Both;1 set;20 reps    Short Arc Quad Sets  Strengthening;Right;1 set;15 reps    Bridges Limitations  2x10     Straight Leg Raises Limitations  2x10              PT Education - 04/04/19 0920    Education Details  reviewed the benefits of taping    Person(s) Educated  Patient    Methods  Explanation;Demonstration;Tactile cues;Verbal cues    Comprehension  Verbalized understanding;Verbal cues required;Returned demonstration;Tactile cues required          PT Long Term Goals - 04/04/19 0940      PT LONG TERM GOAL #1   Title  FOTO score will be 42% to demo increased functional mobility.    Time  6    Period  Weeks    Status  On-going      PT LONG TERM GOAL #2   Title  Pt will be able to squat , lift 25 lbs without knee pain    Time  6    Period  Weeks    Status  On-going      PT LONG TERM GOAL #3    Title  Pt will show strength in Rt knee to >/=4+/5 for normalize knee mechanics, support.    Baseline  3+/5 pain inhibition    Time  6    Period  Weeks    Status  On-going      PT LONG TERM GOAL #4   Title  Pt will be able to walk 20 min with pain no more than moderate pain 5/10 without brace    Time  6    Period  Weeks    Status  On-going      PT LONG TERM GOAL #5   Title  Pt will be able to show Independence with HEP    Time  6    Period  Weeks    Status  On-going            Plan - 04/04/19 0926    Clinical Impression Statement  The last bvisit the tape did not stay on because it was over the ionto. She reports no change with the ionto in the first place. Therapy perfromed the taping that heled the last time. She was shown where to buy the tape. She tolerated exercises but did report some burning inher knee. The patient has 1 more visit apporved. She will likely be sent back to her MD for further follow up.    Comorbidities  diabetes, depression    Examination-Activity Limitations  Squat;Stairs;Lift;Bend;Stand;Sleep;Transfers    Examination-Participation Restrictions  Shop;Community Activity;Interpersonal Relationship    Stability/Clinical Decision Making  Stable/Uncomplicated    Clinical Decision Making  Moderate    Rehab Potential  Excellent    PT Frequency  2x / week    PT Duration  4 weeks    PT Treatment/Interventions  ADLs/Self Care Home Management;Therapeutic activities;Patient/family education;Taping;Therapeutic exercise;Cryotherapy;Ultrasound;Functional mobility training;Manual techniques;Passive range of motion;Iontophoresis 93m/ml Dexamethasone;Moist Heat;Neuromuscular re-education;Gait training;Electrical Stimulation    PT Next Visit Plan  patch did not help. Tape helepd. Continue with strengthening    Consulted and Agree with Plan of Care  Patient       Patient will benefit from skilled therapeutic intervention in order to  improve the following deficits and  impairments:  Abnormal gait, Increased fascial restricitons, Pain, Decreased mobility, Postural dysfunction, Impaired flexibility, Decreased range of motion, Decreased strength, Decreased balance  Visit Diagnosis: Acute pain of right knee  Muscle weakness (generalized)  Difficulty in walking, not elsewhere classified  Patellar tendinitis of right knee     Problem List Patient Active Problem List   Diagnosis Date Noted  . Right knee pain 01/31/2019  . Nausea and vomiting 01/27/2019  . Trigger thumb of right hand 11/22/2018  . Anxiety 11/15/2018  . Abdominal pain 11/14/2018  . Dyspnea 11/14/2018  . Acute sinus infection 11/12/2018  . Constipation 11/12/2018  . Nonallopathic lesion of rib cage 12/21/2017  . Orthostasis 05/11/2017  . Cervicogenic headache 04/27/2017  . Nonallopathic lesion of cervical region 04/27/2017  . Rheumatoid factor positive 02/10/2016  . Myalgia and myositis 01/08/2016  . Low back pain 12/23/2015  . Left shoulder pain 12/23/2015  . Nonallopathic lesion of lumbosacral region 07/29/2015  . Nonallopathic lesion of sacral region 07/29/2015  . Nonallopathic lesion of thoracic region 07/29/2015  . Loss of weight 06/12/2014  . Dysphagia, pharyngoesophageal phase 06/12/2014  . Headache(784.0) 12/18/2012  . Recurrent falls 06/16/2012  . Chest pain 06/16/2012  . Back pain 03/06/2012  . Syncope and collapse 06/18/2011  . Fatigue 01/21/2011  . Microcytic anemia 01/21/2011  . Cervical radiculopathy 01/21/2011  . Preventative health care 10/11/2010  . Depression 01/09/2010  . LIBIDO, DECREASED 01/09/2010  . Migraine without aura 05/15/2009  . INTERMITTENT VERTIGO 05/15/2009  . INSOMNIA-SLEEP DISORDER-UNSPEC 01/04/2008  . Allergic rhinitis 10/04/2007  . Type 2 diabetes mellitus with hyperglycemia, with long-term current use of insulin ([place]) 08/02/2007  . Hyperlipidemia 08/02/2007  . Asthma 08/02/2007  . GERD 08/02/2007    Carney Living PT DPT   04/04/2019, 2:43 PM  [facility] [address] [place], [place], [address] Phone: [phone]   Fax:  [phone]  Name: Christi Wirick MRN: [id number] Date of Birth: [birth year]  PHYSICAL THERAPY DISCHARGE SUMMARY  Visits from Start of Care: 8  Current functional level related to goals / functional outcomes: See above for most recent info    Remaining deficits: Pain, weakness   Education / Equipment: HEP, RICE Plan: Patient agrees to discharge.  Patient goals were partially met. Patient is being discharged due to not returning since the last visit.  ?????    Raeford Razor, PT 05/22/19 8:54 AM Phone: [phone] Fax: [phone]

## 2019-04-04 NOTE — Telephone Encounter (Signed)
Copied from [place] [id number]. Topic: General - Inquiry >> Apr 03, 2019  8:47 AM Richardo Priest, NT wrote: Reason for CRM: Patient called in stating she would like her record of her influenza shot this year to be sent to [place] at work. Please advise.

## 2019-04-05 ENCOUNTER — Ambulatory Visit: Payer: PRIVATE HEALTH INSURANCE | Admitting: Physical Therapy

## 2019-04-05 ENCOUNTER — Encounter: Payer: Self-pay | Admitting: Physical Therapy

## 2019-04-06 ENCOUNTER — Telehealth: Payer: Self-pay | Admitting: Internal Medicine

## 2019-04-06 ENCOUNTER — Other Ambulatory Visit: Payer: Self-pay

## 2019-04-06 MED ORDER — INSULIN LISPRO 100 UNIT/ML ~~LOC~~ SOLN: 15.0000 [IU] | Freq: Three times a day (TID) | SUBCUTANEOUS | 3 refills | Status: DC

## 2019-04-06 NOTE — Telephone Encounter (Signed)
Pharmacy calling for clarification on medication insulin lispro (HUMALOG) 100 UNIT/ML injection. Pharmacy specifically inquired if patient should be on vial or continue using pens.

## 2019-04-06 NOTE — Telephone Encounter (Signed)
Ok for [employer]

## 2019-04-06 NOTE — Telephone Encounter (Signed)
Spoke with [employer] from pharmacy and info given.

## 2019-04-11 ENCOUNTER — Other Ambulatory Visit: Payer: Self-pay

## 2019-04-11 ENCOUNTER — Ambulatory Visit: Payer: PRIVATE HEALTH INSURANCE | Admitting: Physical Therapy

## 2019-04-11 MED ORDER — EMGALITY 120 MG/ML ~~LOC~~ SOAJ: 120.0000 mg | SUBCUTANEOUS | 11 refills | Status: DC

## 2019-04-11 MED ORDER — EMGALITY 120 MG/ML ~~LOC~~ SOAJ: 240.0000 mg | Freq: Once | SUBCUTANEOUS | 0 refills | Status: DC

## 2019-04-11 NOTE — Addendum Note (Signed)
Addended by: Ranae Plumber on: 04/11/2019 02:01 PM   Modules accepted: Orders

## 2019-04-11 NOTE — Progress Notes (Signed)
Spoke with Audrea Muscat at pharmacy Rx for loading dose has been given back in 02/2019. Will D/C that Rx. Will place other on file patient had refills on file already

## 2019-04-18 ENCOUNTER — Ambulatory Visit
Admission: RE | Admit: 2019-04-18 | Discharge: 2019-04-18 | Disposition: A | Discharge status: Home / Self Care | Payer: No Typology Code available for payment source | Source: Ambulatory Visit | Attending: Nurse Practitioner | Admitting: Nurse Practitioner

## 2019-04-18 ENCOUNTER — Other Ambulatory Visit: Payer: Self-pay

## 2019-04-18 DIAGNOSIS — Z8739 Personal history of other diseases of the musculoskeletal system and connective tissue: Secondary | ICD-10-CM

## 2019-04-25 ENCOUNTER — Encounter: Payer: Self-pay | Admitting: Family Medicine

## 2019-04-25 ENCOUNTER — Other Ambulatory Visit: Payer: Self-pay

## 2019-04-25 ENCOUNTER — Ambulatory Visit: Payer: No Typology Code available for payment source | Admitting: Family Medicine

## 2019-04-25 VITALS — BP 106/82 | HR 103 | Ht 61.0 in | Wt 153.0 lb

## 2019-04-25 DIAGNOSIS — M222X1 Patellofemoral disorders, right knee: Secondary | ICD-10-CM

## 2019-04-25 DIAGNOSIS — M542 Cervicalgia: Secondary | ICD-10-CM | POA: Diagnosis not present

## 2019-04-25 DIAGNOSIS — G8929 Other chronic pain: Secondary | ICD-10-CM

## 2019-04-25 DIAGNOSIS — M810 Age-related osteoporosis without current pathological fracture: Secondary | ICD-10-CM

## 2019-04-25 DIAGNOSIS — M222X2 Patellofemoral disorders, left knee: Secondary | ICD-10-CM

## 2019-04-25 DIAGNOSIS — M999 Biomechanical lesion, unspecified: Secondary | ICD-10-CM | POA: Diagnosis not present

## 2019-04-25 HISTORY — DX: Patellofemoral disorders, right knee: M22.2X1

## 2019-04-25 HISTORY — DX: Age-related osteoporosis without current pathological fracture: M81.0

## 2019-04-25 NOTE — Assessment & Plan Note (Signed)
Decision today to treat with OMT was based on Physical Exam  After verbal consent patient was treated with HVLA, ME, FPR techniques in cervical, thoracic, rib,  lumbar and sacral areas  Patient tolerated the procedure well with improvement in symptoms  Patient given exercises, stretches and lifestyle modifications  See medications in patient instructions if given  Patient will follow up in 4-8 weeks 

## 2019-04-25 NOTE — Patient Instructions (Addendum)
Exercise 3 times a week See me again in 5 weeks

## 2019-04-25 NOTE — Assessment & Plan Note (Signed)
Both knees, and doing physical therapy, and given home exercises, Tru pull lite brace given, consider injections at follow-up in 4 to 6 weeks

## 2019-04-25 NOTE — Assessment & Plan Note (Signed)
Osteoporosis.  Patient will be set up for Prolia.  Patient is young and is likely secondary to the diabetes and encouraged control.

## 2019-04-25 NOTE — Progress Notes (Signed)
[name] [address], [place] [address] Phone: [phone] Subjective:   I Rachel [name] am serving as a Education administrator for Dr. Hulan Saas. [place]  CC: Neck pain, knee pain  [id number]  Rachel [name] is a 53 y.o. female coming in with complaint of neck pain. Left knee pain. Patient states she is having a lot of neck pain. Would like knee injection as well. Believes she has fluid on the knee. Also believes she has been compensation due to right knee issues.   Neck pain is worse pain patient continues to have some difficulty.  Patient states that it is chronic.  MRI of the cervical spine did show a C6 nerve root impingement on the right side.  Patient would like to know what the plan is from here on.  Patient is having left knee pain.  Had seen 7 years for the right knee.  Patient has had increasing discomfort and feels like she has swelling.  Increasing sound and pain with going up or down stairs.  Denies any instability that has caused her to fall.  Patient brings bone density exam and showed that patient does have a T score of -2 consistent with osteoporosis    Past Medical History:  Diagnosis Date  . ALLERGIC RHINITIS 10/04/2007  . Anemia 01/21/2011  . ASTHMA 08/02/2007   INHALERS ONLY IN [place]  . Blood transfusion without reported diagnosis    with first child   . COMMON MIGRAINE 05/15/2009  . DIABETES MELLITUS, TYPE II 08/02/2007  . GERD 08/02/2007  . HYPERLIPIDEMIA 08/02/2007  . INSOMNIA-SLEEP DISORDER-UNSPEC 01/04/2008  . INTERMITTENT VERTIGO 05/15/2009  . LIBIDO, DECREASED 01/09/2010   Past Surgical History:  Procedure Laterality Date  . CESAREAN SECTION     x 3  . CYST EXCISION     left knee  . PAROTIDECTOMY  10/21/2011   Procedure: PAROTIDECTOMY;  Surgeon: Melida Quitter, MD;  Location: [facility] OR;  Service: ENT;  Laterality: Left;   Social History   Socioeconomic History  . Marital status: Married    Spouse name: [name]  . Number of  children: 3  . Years of education: Degree  . Highest education level: Not on file  Occupational History  . Occupation: Research scientist (life sciences): [employer]  . Occupation: Chartered certified accountant    Employer: [employer]  . Financial resource strain: Not on file  . Food insecurity    Worry: Not on file    Inability: Not on file  . Transportation needs    Medical: Not on file    Non-medical: Not on file  Tobacco Use  . Smoking status: Never Smoker  . Smokeless tobacco: Never Used  Substance and Sexual Activity  . Alcohol use: No    Alcohol/week: 0.0 standard drinks  . Drug use: No  . Sexual activity: Yes  Lifestyle  . Physical activity    Days per week: Not on file    Minutes per session: Not on file  . Stress: Not on file  Relationships  . Social Herbalist on phone: Not on file    Gets together: Not on file    Attends religious service: Not on file    Active member of club or organization: Not on file    Attends meetings of clubs or organizations: Not on file    Relationship status: Not on file  Other Topics Concern  . Not on  file  Social History Narrative   She has 3 daughters.   Patient has a 4 year degree.    Patient working at [employer].    Patient is married to [place].   Allergies  Allergen Reactions  . Lipitor [Atorvastatin]     REACTION: myalgias  . Nitroglycerin    Family History  Problem Relation Age of Onset  . Hypertension Father   . Diabetes Father   . Asthma Father   . Cervical cancer Maternal Aunt   . Heart disease Maternal Aunt   . Anesthesia problems Neg Hx   . Colon cancer Neg Hx     Current Outpatient Medications (Endocrine & Metabolic):  .  insulin aspart (NOVOLOG FLEXPEN) 100 UNIT/ML FlexPen, Inject 6-10 Units into the skin 3 (three) times daily before meals. .  Insulin Glargine (LANTUS SOLOSTAR) 100 UNIT/ML Solostar Pen, INJECT 40 UNITS INTO THE SKIN IN THE MORNING. .  insulin lispro (HUMALOG) 100 UNIT/ML  injection, Inject 0.15 mLs (15 Units total) into the skin 3 (three) times daily with meals. .  metFORMIN (GLUCOPHAGE-XR) 500 MG 24 hr tablet, TAKE 2 TABLETS BY MOUTH 2 TIMES DAILY WITH A MEAL. .  predniSONE (DELTASONE) 20 MG tablet, Take 1 tablet (20 mg total) by mouth daily with breakfast.  Current Outpatient Medications (Cardiovascular):  .  atenolol (TENORMIN) 25 MG tablet, Take 1 tablet (25 mg total) by mouth daily. X 7 days, then increase to 2 tabs (50mg ) daily .  rosuvastatin (CRESTOR) 40 MG tablet, TAKE 1 TABLET (40 MG TOTAL) BY MOUTH DAILY.  Current Outpatient Medications (Respiratory):  .  benzonatate (TESSALON) 200 MG capsule, Take 1 capsule (200 mg total) by mouth 3 (three) times daily as needed (with full glass of water). .  brompheniramine-pseudoephedrine-DM 30-2-10 MG/5ML syrup, Take 5 mLs by mouth 4 (four) times daily as needed. .  Cetirizine HCl 10 MG CAPS, Take 1 capsule (10 mg total) by mouth daily for 15 days. .  fluticasone (FLONASE) 50 MCG/ACT nasal spray, Place 1-2 sprays into both nostrils daily for 7 days.  Current Outpatient Medications (Analgesics):  .  aspirin 81 MG EC tablet, Take 81 mg by mouth daily.   .  butalbital-acetaminophen-caffeine (FIORICET, ESGIC) 50-325-40 MG tablet, TAKE 1 TABLET BY MOUTH EVERY 6 HOURS AS NEEDED FOR HEADACHE (MAX OF 1 OR 2 TABLETS PER DAY) .  Galcanezumab-gnlm (EMGALITY) 120 MG/ML SOAJ, Inject 120 mg into the skin every 30 (thirty) days. [place] Succinate (REYVOW) 100 MG TABS, Take 100 mg by mouth as needed. Take 1 tablet for headache. No more than 1 tablet in 24 hours .  meloxicam (MOBIC) 15 MG tablet, TAKE 1 TABLET (15 MG TOTAL) BY MOUTH DAILY. .  traMADol (ULTRAM) 50 MG tablet, TAKE 1 TABLET BY MOUTH EVERY 8 HOURS AS NEEDED .  Ubrogepant (UBRELVY) 100 MG TABS, Take 1 tablet by mouth every 2 (two) hours as needed (Maximum 2 tablets/24 hours). .  SUMAtriptan Succinate (ZEMBRACE SYMTOUCH) 3 MG/0.5ML SOAJ, Inject 1 Device into the  skin once. May repeat dose once after 1 hour if headache persists or returns (do not exceed two injections in 24 hours)   Current Outpatient Medications (Other):  .  Blood Glucose Monitoring Suppl (FREESTYLE LITE) DEVI, Use as directed four times daily E11.9 .  cyclobenzaprine (FLEXERIL) 5 MG tablet, Take 1 tablet (5 mg total) by mouth 3 (three) times daily as needed for muscle spasms. .  DENTA 5000 PLUS 1.1 % CREA dental cream,  .  esomeprazole (NEXIUM) 40 MG capsule, TAKE 1 CAPSULE BY MOUTH 2 TIMES DAILY BEFORE A MEAL. Marland Kitchen  estradiol (ESTRACE) 0.1 MG/GM vaginal cream,  .  gabapentin (NEURONTIN) 300 MG capsule, TAKE 1 CAPSULE BY MOUTH 2 TIMES DAILY. Marland Kitchen  glucose blood (FREESTYLE LITE) test strip, Use as instructed four times daily E11.9 .  hydrOXYzine (ATARAX/VISTARIL) 10 MG tablet, Take 1 tablet (10 mg total) by mouth 3 (three) times daily as needed. .  Insulin Pen Needle (UNIFINE PENTIPS) 32G X 4 MM MISC, USE 4x a day .  Lancets (FREESTYLE) lancets, Use as instructed four times daily E11.9 .  linaclotide (LINZESS) 145 MCG CAPS capsule, Take 1 capsule (145 mcg total) by mouth daily before breakfast. .  methocarbamol (ROBAXIN) 500 MG tablet, Take 1 tablet (500 mg total) by mouth 2 (two) times daily as needed for muscle spasms. .  metoCLOPramide (REGLAN) 5 MG tablet, Take 1 tablet (5 mg total) by mouth every 8 (eight) hours as needed for nausea or vomiting. .  nystatin-triamcinolone ointment (MYCOLOG),  .  ondansetron (ZOFRAN) 4 MG tablet, TAKE 1 TABLET BY MOUTH EVERY 8 HOURS AS NEEDED FOR NAUSEA OR VOMITING. .  polyethylene glycol powder (GLYCOLAX/MIRALAX) 17 GM/SCOOP powder, Take 17 g by mouth 2 (two) times daily as needed.    Past medical history, social, surgical and family history all reviewed in electronic medical record.  No pertanent information unless stated regarding to the chief complaint.   Review of Systems:  No headache, visual changes, nausea, vomiting, diarrhea, constipation,  dizziness, abdominal pain, skin rash, fevers, chills, night sweats, weight loss, swollen lymph nodes, body aches, joint swelling, , chest pain, shortness of breath, mood changes.  Positive muscle aches Objective  Blood pressure 106/82, pulse (!) 103, height 5\' 1"  (1.549 m), weight 153 lb (69.4 kg), SpO2 98 %.    General: No apparent distress alert and oriented x3 mood and affect normal, dressed appropriately.  HEENT: Pupils equal, extraocular movements intact  Respiratory: Patient's speak in full sentences and does not appear short of breath  Cardiovascular: No lower extremity edema, non tender, no erythema  Skin: Warm dry intact with no signs of infection or rash on extremities or on axial skeleton.  Abdomen: Soft nontender  Neuro: Cranial nerves II through XII are intact, neurovascularly intact in all extremities with 2+ DTRs and 2+ pulses.  Lymph: No lymphadenopathy of posterior or anterior cervical chain or axillae bilaterally.  Gait normal with good balance and coordination.  MSK:  Non tender with full range of motion and good stability and symmetric strength and tone of shoulders, elbows, wrist, hip, and ankles bilaterally.   Neck exam does show the patient still has a positive Spurling's on the left side.  Knee exam shows the patient does have lateral tracking of the patella bilaterally.  Left greater than right.  Positive patella grind.  No significant instability of the knee.  Full range of motion lacking last 2 degrees of extension of the left knee.  Neurovascularly intact distally.  Osteopathic findings  C2 flexed rotated and side bent right C4 flexed rotated and side bent left C7 flexed rotated and side bent left T3 extended rotated and side bent right inhaled third rib T9 extended rotated and side bent left L2 flexed rotated and side bent right Sacrum right on right     Impression and Recommendations:     This case required medical decision making of moderate  complexity. The above documentation has been reviewed and is accurate and  complete Lyndal Pulley, DO       Note: This dictation was prepared with Dragon dictation along with smaller phrase technology. Any transcriptional errors that result from this process are unintentional.

## 2019-04-27 ENCOUNTER — Other Ambulatory Visit: Payer: Self-pay | Admitting: Neurology

## 2019-05-02 ENCOUNTER — Other Ambulatory Visit: Payer: No Typology Code available for payment source

## 2019-05-09 ENCOUNTER — Ambulatory Visit
Admission: RE | Admit: 2019-05-09 | Discharge: 2019-05-09 | Disposition: A | Discharge status: Home / Self Care | Payer: No Typology Code available for payment source | Source: Ambulatory Visit | Attending: Family Medicine | Admitting: Family Medicine

## 2019-05-09 DIAGNOSIS — M542 Cervicalgia: Secondary | ICD-10-CM

## 2019-05-09 DIAGNOSIS — G8929 Other chronic pain: Secondary | ICD-10-CM

## 2019-05-09 MED ORDER — TRIAMCINOLONE ACETONIDE 40 MG/ML IJ SUSP (RADIOLOGY)
60.0000 mg | Freq: Once | INTRAMUSCULAR | Status: AC
  Administered 2019-05-09: 60 mg via EPIDURAL

## 2019-05-09 MED ORDER — IOPAMIDOL (ISOVUE-M 300) INJECTION 61%
1.0000 mL | Freq: Once | INTRAMUSCULAR | Status: AC | PRN
  Administered 2019-05-09: 11:00:00 1 mL via EPIDURAL

## 2019-05-09 NOTE — Discharge Instructions (Signed)

## 2019-05-28 ENCOUNTER — Encounter: Payer: Self-pay | Admitting: *Deleted

## 2019-05-30 ENCOUNTER — Encounter: Payer: Self-pay | Admitting: Family Medicine

## 2019-05-30 ENCOUNTER — Ambulatory Visit (INDEPENDENT_AMBULATORY_CARE_PROVIDER_SITE_OTHER): Payer: No Typology Code available for payment source | Admitting: Family Medicine

## 2019-05-30 VITALS — BP 100/70 | HR 102 | Ht 61.0 in

## 2019-05-30 DIAGNOSIS — M999 Biomechanical lesion, unspecified: Secondary | ICD-10-CM

## 2019-05-30 DIAGNOSIS — R519 Headache, unspecified: Secondary | ICD-10-CM | POA: Diagnosis not present

## 2019-05-30 DIAGNOSIS — G4486 Cervicogenic headache: Secondary | ICD-10-CM

## 2019-05-30 MED ORDER — DULOXETINE HCL 20 MG PO CPEP: 20.0000 mg | ORAL_CAPSULE | Freq: Every day | ORAL | 0 refills | Status: DC

## 2019-05-30 NOTE — Assessment & Plan Note (Signed)
Decision today to treat with OMT was based on Physical Exam  After verbal consent patient was treated with HVLA, ME, FPR techniques in cervical, thoracic, rib, lumbar and sacral areas  Patient tolerated the procedure well with improvement in symptoms  Patient given exercises, stretches and lifestyle modifications  See medications in patient instructions if given  Patient will follow up in 4-6 weeks 

## 2019-05-30 NOTE — Progress Notes (Signed)
[name] [address] [place], [place] [address] Phone: [phone] Subjective:   I Rachel [name] am serving as a Education administrator for Dr. Hulan Saas.  This visit occurred during the SARS-CoV-2 public health emergency.  Safety protocols were in place, including screening questions prior to the visit, additional usage of staff PPE, and extensive cleaning of exam room while observing appropriate contact time as indicated for disinfecting solutions.   I'm seeing this patient by the request  of:    CC: Neck pain and cervicogenic headaches follow-up  [id number]  Rachel [name] is a 53 y.o. female coming in with complaint of neck, back and knee pain. Patient states she is doing ok. Normal pain as usual.       Patient did have a cervical epidural done on May 09, 2019.  Past Medical History:  Diagnosis Date  . ALLERGIC RHINITIS 10/04/2007  . Anemia 01/21/2011  . ASTHMA 08/02/2007   INHALERS ONLY IN [employer]  . Blood transfusion without reported diagnosis    with first child   . COMMON MIGRAINE 05/15/2009  . DIABETES MELLITUS, TYPE II 08/02/2007  . GERD 08/02/2007  . HYPERLIPIDEMIA 08/02/2007  . INSOMNIA-SLEEP DISORDER-UNSPEC 01/04/2008  . INTERMITTENT VERTIGO 05/15/2009  . LIBIDO, DECREASED 01/09/2010   Past Surgical History:  Procedure Laterality Date  . CESAREAN SECTION     x 3  . CYST EXCISION     left knee  . PAROTIDECTOMY  10/21/2011   Procedure: PAROTIDECTOMY;  Surgeon: Melida Quitter, MD;  [facility] OR;  Service: ENT;  Laterality: Left;   Social History   Socioeconomic History  . Marital status: Married    Spouse name: Rachel [name]  . Number of children: 3  . Years of education: Degree  . Highest education level: Not on file  Occupational History  . Occupation: Research scientist (life sciences): [employer]  . Occupation: Chartered certified accountant    Employer: [employer]  Tobacco Use  . Smoking status: Never Smoker  . Smokeless tobacco: Never Used   Substance and Sexual Activity  . Alcohol use: No    Alcohol/week: 0.0 standard drinks  . Drug use: No  . Sexual activity: Yes  Other Topics Concern  . Not on file  Social History Narrative   She has 3 daughters.   Patient has a 4 year degree.    Patient working at [employer].    Patient is married to Rachel [name].   Social Determinants of Health   Financial Resource Strain:   . Difficulty of Paying Living Expenses: Not on file  Food Insecurity:   . Worried About Charity fundraiser in the Last Year: Not on file  . Ran Out of Food in the Last Year: Not on file  Transportation Needs:   . Lack of Transportation (Medical): Not on file  . Lack of Transportation (Non-Medical): Not on file  Physical Activity:   . Days of Exercise per Week: Not on file  . Minutes of Exercise per Session: Not on file  Stress:   . Feeling of Stress : Not on file  Social Connections:   . Frequency of Communication with Friends and Family: Not on file  . Frequency of Social Gatherings with Friends and Family: Not on file  . Attends Religious Services: Not on file  . Active Member of Clubs or Organizations: Not on file  . Attends Archivist Meetings: Not on file  . Marital Status: Not on  file   Allergies  Allergen Reactions  . Lipitor [Atorvastatin]     REACTION: myalgias  . Nitroglycerin    Family History  Problem Relation Age of Onset  . Hypertension Father   . Diabetes Father   . Asthma Father   . Cervical cancer Maternal Aunt   . Heart disease Maternal Aunt   . Anesthesia problems Neg Hx   . Colon cancer Neg Hx     Current Outpatient Medications (Endocrine & Metabolic):  .  insulin aspart (NOVOLOG FLEXPEN) 100 UNIT/ML FlexPen, Inject 6-10 Units into the skin 3 (three) times daily before meals. .  Insulin Glargine (LANTUS SOLOSTAR) 100 UNIT/ML Solostar Pen, INJECT 40 UNITS INTO THE SKIN IN THE MORNING. .  insulin lispro (HUMALOG) 100 UNIT/ML injection, Inject 0.15 mLs (15 Units  total) into the skin 3 (three) times daily with meals. .  metFORMIN (GLUCOPHAGE-XR) 500 MG 24 hr tablet, TAKE 2 TABLETS BY MOUTH 2 TIMES DAILY WITH A MEAL. .  predniSONE (DELTASONE) 20 MG tablet, Take 1 tablet (20 mg total) by mouth daily with breakfast.  Current Outpatient Medications (Cardiovascular):  .  atenolol (TENORMIN) 25 MG tablet, Take 1 tablet (25 mg total) by mouth daily. X 7 days, then increase to 2 tabs (50mg ) daily .  rosuvastatin (CRESTOR) 40 MG tablet, TAKE 1 TABLET (40 MG TOTAL) BY MOUTH DAILY.  Current Outpatient Medications (Respiratory):  .  benzonatate (TESSALON) 200 MG capsule, Take 1 capsule (200 mg total) by mouth 3 (three) times daily as needed (with full glass of water). .  brompheniramine-pseudoephedrine-DM 30-2-10 MG/5ML syrup, Take 5 mLs by mouth 4 (four) times daily as needed. .  Cetirizine HCl 10 MG CAPS, Take 1 capsule (10 mg total) by mouth daily for 15 days. .  fluticasone (FLONASE) 50 MCG/ACT nasal spray, Place 1-2 sprays into both nostrils daily for 7 days.  Current Outpatient Medications (Analgesics):  .  aspirin 81 MG EC tablet, Take 81 mg by mouth daily.   .  butalbital-acetaminophen-caffeine (FIORICET, ESGIC) 50-325-40 MG tablet, TAKE 1 TABLET BY MOUTH EVERY 6 HOURS AS NEEDED FOR HEADACHE (MAX OF 1 OR 2 TABLETS PER DAY) .  Galcanezumab-gnlm (EMGALITY) 120 MG/ML SOAJ, Inject 120 mg into the skin every 30 (thirty) days. Rachel [name] (REYVOW) 100 MG TABS, Take 100 mg by mouth as needed. Take 1 tablet for headache. No more than 1 tablet in 24 hours .  meloxicam (MOBIC) 15 MG tablet, TAKE 1 TABLET (15 MG TOTAL) BY MOUTH DAILY. .  traMADol (ULTRAM) 50 MG tablet, TAKE 1 TABLET BY MOUTH EVERY 8 HOURS AS NEEDED .  Ubrogepant (UBRELVY) 100 MG TABS, Take 1 tablet by mouth every 2 (two) hours as needed (Maximum 2 tablets/24 hours). .  SUMAtriptan [name] (ZEMBRACE SYMTOUCH) 3 MG/0.5ML SOAJ, Inject 1 Device into the skin once. May repeat dose once after  1 hour if headache persists or returns (do not exceed two injections in 24 hours)   Current Outpatient Medications (Other):  .  Blood Glucose Monitoring Suppl (FREESTYLE LITE) DEVI, Use as directed four times daily E11.9 .  cyclobenzaprine (FLEXERIL) 5 MG tablet, Take 1 tablet (5 mg total) by mouth 3 (three) times daily as needed for muscle spasms. .  DENTA 5000 PLUS 1.1 % CREA dental cream,  .  esomeprazole (NEXIUM) 40 MG capsule, TAKE 1 CAPSULE BY MOUTH 2 TIMES DAILY BEFORE A MEAL. Marland Kitchen  estradiol (ESTRACE) 0.1 MG/GM vaginal cream,  .  gabapentin (NEURONTIN) 300 MG capsule, TAKE 1  CAPSULE BY MOUTH 2 TIMES DAILY. Marland Kitchen  glucose blood (FREESTYLE LITE) test strip, Use as instructed four times daily E11.9 .  hydrOXYzine (ATARAX/VISTARIL) 10 MG tablet, Take 1 tablet (10 mg total) by mouth 3 (three) times daily as needed. .  Insulin Pen Needle (UNIFINE PENTIPS) 32G X 4 MM MISC, USE 4x a day .  Lancets (FREESTYLE) lancets, Use as instructed four times daily E11.9 .  linaclotide (LINZESS) 145 MCG CAPS capsule, Take 1 capsule (145 mcg total) by mouth daily before breakfast. .  methocarbamol (ROBAXIN) 500 MG tablet, Take 1 tablet (500 mg total) by mouth 2 (two) times daily as needed for muscle spasms. .  metoCLOPramide (REGLAN) 5 MG tablet, Take 1 tablet (5 mg total) by mouth every 8 (eight) hours as needed for nausea or vomiting. .  nystatin-triamcinolone ointment (MYCOLOG),  .  ondansetron (ZOFRAN) 4 MG tablet, TAKE 1 TABLET BY MOUTH EVERY 8 HOURS AS NEEDED FOR NAUSEA OR VOMITING. .  polyethylene glycol powder (GLYCOLAX/MIRALAX) 17 GM/SCOOP powder, Take 17 g by mouth 2 (two) times daily as needed. .  DULoxetine (CYMBALTA) 20 MG capsule, Take 1 capsule (20 mg total) by mouth daily.    Past medical history, social, surgical and family history all reviewed in electronic medical record.  No pertanent information unless stated regarding to the chief complaint.   Review of Systems:  No , visual changes,  nausea, vomiting, diarrhea, constipation, dizziness, abdominal pain, skin rash, fevers, chills, night sweats, weight loss, swollen lymph nodes,  joint swelling,  chest pain, shortness of breath, mood changes.  Positive headache, body aches, muscle aches  Objective  Blood pressure 100/70, pulse (!) 102, height 5\' 1"  (1.549 m), SpO2 96 %.    General: No apparent distress alert and oriented x3 mood and affect normal, dressed appropriately.  HEENT: Pupils equal, extraocular movements intact  Respiratory: Patient's speak in full sentences and does not appear short of breath  Cardiovascular: No lower extremity edema, non tender, no erythema  Skin: Warm dry intact with no signs of infection or rash on extremities or on axial skeleton.  Abdomen: Soft nontender  Neuro: Cranial nerves II through XII are intact, neurovascularly intact in all extremities with 2+ DTRs and 2+ pulses.  Lymph: No lymphadenopathy of posterior or anterior cervical chain or axillae bilaterally.  Gait normal with good balance and coordination.  MSK:  tender with full range of motion and good stability and symmetric strength and tone of shoulders, elbows, wrist, hip, knee and ankles bilaterally.  Neck: Inspection unremarkable. No palpable stepoffs. Negative Spurling's maneuver. Full neck range of motion Grip strength and sensation normal in bilateral hands Strength good C4 to T1 distribution No sensory change to C4 to T1 Negative Hoffman sign bilaterally Reflexes normal  Osteopathic findings  C2 flexed rotated and side bent right C4 flexed rotated and side bent left C7 flexed rotated and side bent left T3 extended rotated and side bent right inhaled third rib T9 extended rotated and side bent left L2 flexed rotated and side bent right Sacrum right on right     Impression and Recommendations:     This case required medical decision making of moderate complexity. The above documentation has been reviewed and is  accurate and complete Lyndal Pulley, DO       Note: This dictation was prepared with Dragon dictation along with smaller phrase technology. Any transcriptional errors that result from this process are unintentional.

## 2019-05-30 NOTE — Assessment & Plan Note (Signed)
Cervicogenic headache with mild protruding disc.  Nontender epidural.  Continues with osteopathic manipulation, encourage ergonomics and home exercises, discussed which activities to do which wants to avoid.  Patient will increase activity as tolerated.  Follow-up again in 4 to 8 weeks

## 2019-05-30 NOTE — Patient Instructions (Addendum)
  [address] [place],  [address] Phone [phone]  Good to see you Happy Holidays! Cymbalta 20 See me again 5 weeks

## 2019-06-13 ENCOUNTER — Ambulatory Visit
Admission: RE | Admit: 2019-06-13 | Discharge: 2019-06-13 | Disposition: A | Discharge status: Home / Self Care | Payer: No Typology Code available for payment source | Source: Ambulatory Visit | Attending: Family Medicine | Admitting: Family Medicine

## 2019-06-13 DIAGNOSIS — M999 Biomechanical lesion, unspecified: Secondary | ICD-10-CM

## 2019-06-13 MED ORDER — TRIAMCINOLONE ACETONIDE 40 MG/ML IJ SUSP (RADIOLOGY)
60.0000 mg | Freq: Once | INTRAMUSCULAR | Status: AC
  Administered 2019-06-13: 60 mg via EPIDURAL

## 2019-06-13 MED ORDER — IOPAMIDOL (ISOVUE-M 300) INJECTION 61%
1.0000 mL | Freq: Once | INTRAMUSCULAR | Status: AC | PRN
  Administered 2019-06-13: 1 mL via EPIDURAL

## 2019-06-27 ENCOUNTER — Telehealth: Payer: Self-pay | Admitting: *Deleted

## 2019-06-27 ENCOUNTER — Other Ambulatory Visit: Payer: Self-pay

## 2019-06-27 MED ORDER — DULOXETINE HCL 20 MG PO CPEP: 20.0000 mg | ORAL_CAPSULE | Freq: Every day | ORAL | 0 refills | Status: DC

## 2019-06-27 NOTE — Telephone Encounter (Signed)
Pt called stating she taken a month supply of the Cymbalta that Dr. [name] prescribed at the last OV. Pt stated that she does feel like this is helping but would like to know if Dr. [name] would like for her to continue to take this.

## 2019-06-27 NOTE — Telephone Encounter (Signed)
Rx refilled per a verbal from Dr. Smith. 

## 2019-07-02 ENCOUNTER — Ambulatory Visit (INDEPENDENT_AMBULATORY_CARE_PROVIDER_SITE_OTHER): Payer: 59 | Admitting: Internal Medicine

## 2019-07-02 ENCOUNTER — Encounter: Payer: Self-pay | Admitting: Internal Medicine

## 2019-07-02 ENCOUNTER — Other Ambulatory Visit: Payer: Self-pay

## 2019-07-02 VITALS — BP 110/72 | HR 106 | Ht 61.0 in | Wt 154.0 lb

## 2019-07-02 DIAGNOSIS — E663 Overweight: Secondary | ICD-10-CM

## 2019-07-02 DIAGNOSIS — E1165 Type 2 diabetes mellitus with hyperglycemia: Secondary | ICD-10-CM | POA: Diagnosis not present

## 2019-07-02 DIAGNOSIS — Z794 Long term (current) use of insulin: Secondary | ICD-10-CM | POA: Diagnosis not present

## 2019-07-02 DIAGNOSIS — E782 Mixed hyperlipidemia: Secondary | ICD-10-CM

## 2019-07-02 LAB — POCT GLYCOSYLATED HEMOGLOBIN (HGB A1C): Hemoglobin A1C: 11.1 % — AB (ref 4.0–5.6)

## 2019-07-02 MED ORDER — OZEMPIC (0.25 OR 0.5 MG/DOSE) 2 MG/1.5ML ~~LOC~~ SOPN: 0.5000 mg | PEN_INJECTOR | SUBCUTANEOUS | 5 refills | Status: DC

## 2019-07-02 MED ORDER — INSULIN LISPRO (1 UNIT DIAL) 100 UNIT/ML (KWIKPEN): 8.0000 [IU] | PEN_INJECTOR | Freq: Three times a day (TID) | SUBCUTANEOUS | 11 refills | Status: DC

## 2019-07-02 NOTE — Addendum Note (Signed)
Addended by: Cardell Peach I on: 07/02/2019 03:12 PM   Modules accepted: Orders

## 2019-07-02 NOTE — Patient Instructions (Addendum)
Please continue: - Lantus 40 units in a.m.  Please increase: - Humalog 8-12 units 15 min before meals  Please start Ozempic 0.25 mg weekly in a.m. (for example on Sunday morning) x 4 weeks, then increase to 0.5 mg weekly in a.m. if no nausea or hypoglycemia.  Please return in 1.5 months with your sugar log.

## 2019-07-02 NOTE — Progress Notes (Signed)
Subjective:     Patient ID: Rachel [name], female   DOB: 1966-01-13, 54 y.o.   MRN: [id number]  This visit occurred during the SARS-CoV-2 public health emergency.  Safety protocols were in place, including screening questions prior to the visit, additional usage of staff PPE, and extensive cleaning of exam room while observing appropriate contact time as indicated for disinfecting solutions.   HPI Rachel [name] is a  54 y.o. woman, returning for f/u for DM2, dx 1987, uncontrolled, insulin-dependent, with probable complications (? peripheral neuropathy). Last visit 1 year and 7 months ago.  She has steroid inj's in back in 11 and 05/2019. Sugars were higher and they did not improve significantly since then.  Reviewed HbA1c levels:: Lab Results  Component Value Date   HGBA1C 10.2 (H) 11/14/2018   HGBA1C 11.2 (H) 08/23/2018   HGBA1C 8.6 (A) 04/26/2018   HGBA1C 0 04/26/2018   HGBA1C 0 (A) 04/26/2018   HGBA1C 0.0 04/26/2018  02/17/2017: HbA1c calculated from fructosamine is better, at 8.34%, but still high. Prev. 10.1%.  Reviewed history: She came off all DM medicines for 6 mo before the HbA1c in 09/2013. She again ran out of all meds in 04/2016.  She is  on: - Lantus 36 units in am >> night >> 40 units in a.m. - Humalog 3-7 units before meals Stopped Januvia 100 mg in am >> then Ozempic 0.5 mg weekly >> ...cannot remember if she actually started this... She is off Metformin XR 1000 mg 2x a day with b'fast and dinner >> upset stomach >> stopped [id number] We triedTrulicity 1.5 mg weekly >> worked great but had to stop b/c GERD >> stopped. We tried Jardiance 25 mg daily >> started 08/2016 >> but stopped recently 2/2 recurrent UTIs She was on Glipizide 5 mg bid - added 04/2015 >> was not taking it b/c low CBGs. We stopped Glipizide XL 5 mg in 07/2014. She had episodes of hypoglycemia with Amaryl in the past. She was on Bydureon 2 mg weekly (had nausea).  She checks her sugars 1-2 times a  day: - am:   102-110, 260 >> 130s >> 134-200 >> 180-300 - after b'fast: 158-251 >> n/c - before lunch:  n/c >> 137 >> n/c >> 180-200 - after lunch: up to 200s - before dinner:  191, 308 >> n/c >> 180-200 - after dinner: 166-245 >> n/c - bedtime:n140-150 >> 140-150s >> n/c >> 300 (may take 6-7 units of Humalog) She has hypoglycemia awareness in the 90s. Lowest: 38 (while on Trulicity) >> [id number] when she moved basal insulin at bedtime >> 170. Highest 600 >> ... >> 450.  She has seen nutrition in the past.  -+ HL. Last lipids: Lab Results  Component Value Date   CHOL 274 (H) 08/23/2018   HDL 55.20 08/23/2018   LDLCALC 194 (H) 11/23/2017   LDLDIRECT 191.0 08/23/2018   TRIG 322.0 (H) 08/23/2018   CHOLHDL 5 08/23/2018  On Crestor 40. 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 Has appt tomorrow.  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  Review of Systems Constitutional: + weight gain/no weight loss, no fatigue, no subjective hyperthermia, no subjective hypothermia Eyes: no blurry vision, no xerophthalmia ENT: no sore throat, no nodules palpated in neck, no dysphagia, no odynophagia,  no hoarseness Cardiovascular: no CP/no SOB/no palpitations/no leg swelling Respiratory: no cough/no SOB/no wheezing Gastrointestinal: no N/no V/no D/no C/no acid reflux Musculoskeletal: no muscle aches/no joint aches Skin: no rashes, no hair loss Neurological: no tremors/no numbness/no tingling/no dizziness  I reviewed pt's medications, allergies, PMH, social hx, family hx, and changes were documented in the history of present illness. Otherwise, unchanged from my initial visit note.  Past Medical History:  Diagnosis Date  . ALLERGIC RHINITIS 10/04/2007  . Anemia 01/21/2011  . ASTHMA 08/02/2007   INHALERS ONLY IN [place]  . Blood  transfusion without reported diagnosis    with first child   . COMMON MIGRAINE 05/15/2009  . DIABETES MELLITUS, TYPE II 08/02/2007  . GERD 08/02/2007  . HYPERLIPIDEMIA 08/02/2007  . INSOMNIA-SLEEP DISORDER-UNSPEC 01/04/2008  . INTERMITTENT VERTIGO 05/15/2009  . LIBIDO, DECREASED 01/09/2010   Past Surgical History:  Procedure Laterality Date  . CESAREAN SECTION     x 3  . CYST EXCISION     left knee  . PAROTIDECTOMY  10/21/2011   Procedure: PAROTIDECTOMY;  Surgeon: Melida Quitter, MD;  Location: [facility];  Service: ENT;  Laterality: Left;   Social History   Socioeconomic History  . Marital status: Married    Spouse name: [name]  . Number of children: 3  . Years of education: Degree  . Highest education level: Not on file  Occupational History  . Occupation: Research scientist (life sciences): [employer]  . Occupation: Chartered certified accountant    Employer: [employer]  Tobacco Use  . Smoking status: Never Smoker  . Smokeless tobacco: Never Used  Substance and Sexual Activity  . Alcohol use: No    Alcohol/week: 0.0 standard drinks  . Drug use: No  . Sexual activity: Yes  Other Topics Concern  . Not on file  Social History Narrative   She has 3 daughters.   Patient has a 4 year degree.    Patient working at [employer].    Patient is married to Sheldon.   Social Determinants of Health   Financial Resource Strain:   . Difficulty of Paying Living Expenses: Not on file  Food Insecurity:   . Worried About Charity fundraiser in the Last Year: Not on file  . Ran Out of Food in the Last Year: Not on file  Transportation Needs:   . Lack of Transportation (Medical): Not on file  . Lack of Transportation (Non-Medical): Not on file  Physical Activity:   . Days of Exercise per Week: Not on file  . Minutes of Exercise per Session: Not on file  Stress:   . Feeling of Stress : Not on file  Social Connections:   . Frequency of Communication with Friends and Family: Not on file  . Frequency of Social  Gatherings with Friends and Family: Not on file  . Attends Religious Services: Not on file  . Active Member of Clubs or Organizations: Not on file  . Attends Archivist Meetings: Not on file  . Marital Status: Not on file  Intimate Partner Violence:   . Fear of Current or Ex-Partner: Not on file  . Emotionally Abused: Not on file  . Physically Abused: Not on file  . Sexually Abused: Not on file   Current Outpatient Medications on File Prior to Visit  Medication Sig Dispense Refill  . aspirin 81 MG EC tablet Take 81 mg by mouth daily.      Marland Kitchen atenolol (TENORMIN) 25 MG  tablet Take 1 tablet (25 mg total) by mouth daily. X 7 days, then increase to 2 tabs (50mg ) daily 60 tablet 3  . benzonatate (TESSALON) 200 MG capsule Take 1 capsule (200 mg total) by mouth 3 (three) times daily as needed (with full glass of water). 20 capsule 0  . Blood Glucose Monitoring Suppl (FREESTYLE LITE) DEVI Use as directed four times daily E11.9 1 each 0  . brompheniramine-pseudoephedrine-DM 30-2-10 MG/5ML syrup Take 5 mLs by mouth 4 (four) times daily as needed. 120 mL 0  . butalbital-acetaminophen-caffeine (FIORICET, ESGIC) 50-325-40 MG tablet TAKE 1 TABLET BY MOUTH EVERY 6 HOURS AS NEEDED FOR HEADACHE (MAX OF 1 OR 2 TABLETS PER DAY) 30 tablet 5  . Cetirizine HCl 10 MG CAPS Take 1 capsule (10 mg total) by mouth daily for 15 days. 15 capsule 0  . cyclobenzaprine (FLEXERIL) 5 MG tablet Take 1 tablet (5 mg total) by mouth 3 (three) times daily as needed for muscle spasms. 90 tablet 2  . DENTA 5000 PLUS 1.1 % CREA dental cream   3  . DULoxetine (CYMBALTA) 20 MG capsule Take 1 capsule (20 mg total) by mouth daily. 30 capsule 0  . esomeprazole (NEXIUM) 40 MG capsule TAKE 1 CAPSULE BY MOUTH 2 TIMES DAILY BEFORE A MEAL. 180 capsule 3  . estradiol (ESTRACE) 0.1 MG/GM vaginal cream     . fluticasone (FLONASE) 50 MCG/ACT nasal spray Place 1-2 sprays into both nostrils daily for 7 days. 1 g 0  . gabapentin (NEURONTIN)  300 MG capsule TAKE 1 CAPSULE BY MOUTH 2 TIMES DAILY. 60 capsule 3  . Galcanezumab-gnlm (EMGALITY) 120 MG/ML SOAJ Inject 120 mg into the skin every 30 (thirty) days. 1 pen 11  . glucose blood (FREESTYLE LITE) test strip Use as instructed four times daily E11.9 400 each 12  . hydrOXYzine (ATARAX/VISTARIL) 10 MG tablet Take 1 tablet (10 mg total) by mouth 3 (three) times daily as needed. 30 tablet 0  . insulin aspart (NOVOLOG FLEXPEN) 100 UNIT/ML FlexPen Inject 6-10 Units into the skin 3 (three) times daily before meals. 15 mL 11  . Insulin Glargine (LANTUS SOLOSTAR) 100 UNIT/ML Solostar Pen INJECT 40 UNITS INTO THE SKIN IN THE MORNING. 33 mL 1  . insulin lispro (HUMALOG) 100 UNIT/ML injection Inject 0.15 mLs (15 Units total) into the skin 3 (three) times daily with meals. 40 mL 3  . Insulin Pen Needle (UNIFINE PENTIPS) 32G X 4 MM MISC USE 4x a day 200 each PRN  . Lancets (FREESTYLE) lancets Use as instructed four times daily E11.9 400 each 12  . Lasmiditan Succinate (REYVOW) 100 MG TABS Take 100 mg by mouth as needed. Take 1 tablet for headache. No more than 1 tablet in 24 hours 8 tablet 3  . linaclotide (LINZESS) 145 MCG CAPS capsule Take 1 capsule (145 mcg total) by mouth daily before breakfast. 30 capsule 2  . meloxicam (MOBIC) 15 MG tablet TAKE 1 TABLET (15 MG TOTAL) BY MOUTH DAILY. 90 tablet 0  . metFORMIN (GLUCOPHAGE-XR) 500 MG 24 hr tablet TAKE 2 TABLETS BY MOUTH 2 TIMES DAILY WITH A MEAL. 360 tablet 3  . methocarbamol (ROBAXIN) 500 MG tablet Take 1 tablet (500 mg total) by mouth 2 (two) times daily as needed for muscle spasms. 30 tablet 2  . metoCLOPramide (REGLAN) 5 MG tablet Take 1 tablet (5 mg total) by mouth every 8 (eight) hours as needed for nausea or vomiting. 40 tablet 1  . nystatin-triamcinolone ointment (MYCOLOG)     .  ondansetron (ZOFRAN) 4 MG tablet TAKE 1 TABLET BY MOUTH EVERY 8 HOURS AS NEEDED FOR NAUSEA OR VOMITING. 40 tablet 1  . polyethylene glycol powder (GLYCOLAX/MIRALAX)  17 GM/SCOOP powder Take 17 g by mouth 2 (two) times daily as needed. 3350 g 1  . predniSONE (DELTASONE) 20 MG tablet Take 1 tablet (20 mg total) by mouth daily with breakfast. 5 tablet 0  . rosuvastatin (CRESTOR) 40 MG tablet TAKE 1 TABLET (40 MG TOTAL) BY MOUTH DAILY. 90 tablet 3  . SUMAtriptan Succinate (ZEMBRACE SYMTOUCH) 3 MG/0.5ML SOAJ Inject 1 Device into the skin once. May repeat dose once after 1 hour if headache persists or returns (do not exceed two injections in 24 hours) 9 pen 0  . traMADol (ULTRAM) 50 MG tablet TAKE 1 TABLET BY MOUTH EVERY 8 HOURS AS NEEDED 60 tablet 1  . Ubrogepant (UBRELVY) 100 MG TABS Take 1 tablet by mouth every 2 (two) hours as needed (Maximum 2 tablets/24 hours). 20 tablet 5   No current facility-administered medications on file prior to visit.   Allergies  Allergen Reactions  . Lipitor [Atorvastatin]     REACTION: myalgias  . Nitroglycerin    Family History  Problem Relation Age of Onset  . Hypertension Father   . Diabetes Father   . Asthma Father   . Cervical cancer Maternal Aunt   . Heart disease Maternal Aunt   . Anesthesia problems Neg Hx   . Colon cancer Neg Hx     Objective:   Physical Exam BP 110/72   Pulse (!) 106   Ht 5\' 1"  (1.549 m)   Wt 154 lb (69.9 kg)   SpO2 98%   BMI 29.10 kg/m  Body mass index is 29.1 kg/m. Wt Readings from Last 3 Encounters:  07/02/19 154 lb (69.9 kg)  04/25/19 153 lb (69.4 kg)  03/07/19 152 lb (68.9 kg)   Constitutional: normal weight, in NAD Eyes: PERRLA, EOMI, no exophthalmos ENT: moist mucous membranes, no thyromegaly, no cervical lymphadenopathy Cardiovascular: tachycardia, RR, No MRG Respiratory: CTA B Gastrointestinal: abdomen soft, NT, ND, BS+ Musculoskeletal: no deformities, strength intact in all 4 Skin: moist, warm, no rashes Neurological: no tremor with outstretched hands, DTR normal in all 4  Assessment:     1. DM2, uncontrolled, insulin-dependent, with probable complications - ?  peripheral neuropathy  2. HL  3.  Overweight  Plan:     1. Patient with uncontrolled type 2 diabetes, on insulin, Metformin, and weekly GLP-1 receptor agonist, returning after long absence of 1 year and 7 months.  She is on a different regimen than the one recommended at last visit. -At last visit, HbA1c was much higher than before, at 12.7%.  We changed from Januvia to [place] at that time.  Of note, in the past, she had severe GERD with Trulicity and we had to stop it.  She also had nausea with Bydureon.  In the past, we tried an SGLT 2 inhibitor and oral sulfonylureas and she could not tolerate them. -At this visit, sugars are very high.  She tells me that she actually does not remember starting Ozempic as recommended at last visit.  She also stopped Metformin due to GI intolerance.  She was started on short-acting insulin and she continues on this, low dose while her Lantus dose was increased by PCP. -At today's visit, per her recall, sugars are high in the morning and they continue to stay well above target later in the day and they are higher  after dinner.  At this visit, we will increase the Humalog and I again advised her to try Ozempic.  Discussed about benefits and possible side effects. -She is interested in a CGM and I advised her to bring me her log or meter at last visit documented that she is checking 4 times a day and I will send a prescription for this to her insurance. - I advised her to: Patient Instructions  Please continue: - Lantus 40 units in a.m.  Please increase: - Humalog 8-12 units 15 min before meals  Please start Ozempic 0.25 mg weekly in a.m. (for example on Sunday morning) x 4 weeks, then increase to 0.5 mg weekly in a.m. if no nausea or hypoglycemia.  Please return in 3 months with your sugar log.   - we checked her HbA1c: 11.1% (higher) - advised to check sugars at different times of the day - 2x a day, rotating check times - advised for yearly eye exams >>  she is UTD - return to clinic in 1.5 months  2. HL - Reviewed latest lipid panel from 08/2018: LDL extremely high, triglycerides high Lab Results  Component Value Date   CHOL 274 (H) 08/23/2018   HDL 55.20 08/23/2018   LDLCALC 194 (H) 11/23/2017   LDLDIRECT 191.0 08/23/2018   TRIG 322.0 (H) 08/23/2018   CHOLHDL 5 08/23/2018  - Continues Crestor 40 but she tells me she does not take it consistently >> will look at home to see if she has any left  3.  Overweight -will restart Ozempic which should also help with weight loss -She gained approximately 13 pounds since last visit  Philemon Kingdom, MD PhD [facility] Endocrin[facility]

## 2019-07-03 DIAGNOSIS — H524 Presbyopia: Secondary | ICD-10-CM | POA: Diagnosis not present

## 2019-07-03 DIAGNOSIS — H52221 Regular astigmatism, right eye: Secondary | ICD-10-CM | POA: Diagnosis not present

## 2019-07-03 DIAGNOSIS — H5213 Myopia, bilateral: Secondary | ICD-10-CM | POA: Diagnosis not present

## 2019-07-04 ENCOUNTER — Other Ambulatory Visit: Payer: Self-pay

## 2019-07-04 ENCOUNTER — Encounter: Payer: Self-pay | Admitting: Family Medicine

## 2019-07-04 ENCOUNTER — Ambulatory Visit (INDEPENDENT_AMBULATORY_CARE_PROVIDER_SITE_OTHER): Payer: 59 | Admitting: Family Medicine

## 2019-07-04 VITALS — BP 110/70 | HR 104 | Ht 61.0 in | Wt 153.0 lb

## 2019-07-04 DIAGNOSIS — G4486 Cervicogenic headache: Secondary | ICD-10-CM

## 2019-07-04 DIAGNOSIS — R519 Headache, unspecified: Secondary | ICD-10-CM | POA: Diagnosis not present

## 2019-07-04 DIAGNOSIS — M999 Biomechanical lesion, unspecified: Secondary | ICD-10-CM | POA: Diagnosis not present

## 2019-07-04 NOTE — Assessment & Plan Note (Signed)
Continued cervicogenic headaches.  I do believe some underlying anxiety and depression could be playing a significant role at this time.  We discussed the possibility of increasing the Cymbalta but patient will continue with the same dose at this time.  Discussed icing regimen and home exercises, patient is doing less physical activity at work at the moment.  Patient will follow up with me again in 6 to 8 weeks.

## 2019-07-04 NOTE — Progress Notes (Signed)
[facility] [address] [place] Phone: [phone] Subjective:   I Rachel [name] am serving as a Education administrator for Dr. Hulan Saas.  This visit occurred during the SARS-CoV-2 public health emergency.  Safety protocols were in place, including screening questions prior to the visit, additional usage of staff PPE, and extensive cleaning of exam room while observing appropriate contact time as indicated for disinfecting solutions.   I'm seeing this patient by the request  of:  Biagio Borg, MD  CC: Neck pain, headache follow-up  [id number]  Rachel [name] is a 54 y.o. female coming in with complaint of neck pain. OMT.Patient state she is doing well. Patient does have some neck pain.  Continues to have chronic headaches on a regular basis.  Patient does have chronic low back pain.  Still having pain all over.  Taking the Cymbalta but does not know if it is making a significant difference at the moment.  Patient is doing less physical activity though and is changing job positions at some point in the near future that she thinks might be beneficial.     Past Medical History:  Diagnosis Date  . ALLERGIC RHINITIS 10/04/2007  . Anemia 01/21/2011  . ASTHMA 08/02/2007   INHALERS ONLY IN [place]  . Blood transfusion without reported diagnosis    with first child   . COMMON MIGRAINE 05/15/2009  . DIABETES MELLITUS, TYPE II 08/02/2007  . GERD 08/02/2007  . HYPERLIPIDEMIA 08/02/2007  . INSOMNIA-SLEEP DISORDER-UNSPEC 01/04/2008  . INTERMITTENT VERTIGO 05/15/2009  . LIBIDO, DECREASED 01/09/2010   Past Surgical History:  Procedure Laterality Date  . CESAREAN SECTION     x 3  . CYST EXCISION     left knee  . PAROTIDECTOMY  10/21/2011   Procedure: PAROTIDECTOMY;  Surgeon: Melida Quitter, MD;  Location: [facility] OR;  Service: ENT;  Laterality: Left;   Social History   Socioeconomic History  . Marital status: Married    Spouse name: [name]  . Number of  children: 3  . Years of education: Degree  . Highest education level: Not on file  Occupational History  . Occupation: Research scientist (life sciences): [employer]  . Occupation: Chartered certified accountant    Employer: [employer]  Tobacco Use  . Smoking status: Never Smoker  . Smokeless tobacco: Never Used  Substance and Sexual Activity  . Alcohol use: No    Alcohol/week: 0.0 standard drinks  . Drug use: No  . Sexual activity: Yes  Other Topics Concern  . Not on file  Social History Narrative   She has 3 daughters.   Patient has a 4 year degree.    Patient working at [employer].    Patient is married to [place].   Social Determinants of Health   Financial Resource Strain:   . Difficulty of Paying Living Expenses: Not on file  Food Insecurity:   . Worried About Charity fundraiser in the Last Year: Not on file  . Ran Out of Food in the Last Year: Not on file  Transportation Needs:   . Lack of Transportation (Medical): Not on file  . Lack of Transportation (Non-Medical): Not on file  Physical Activity:   . Days of Exercise per Week: Not on file  . Minutes of Exercise per Session: Not on file  Stress:   . Feeling of Stress : Not on file  Social Connections:   . Frequency of Communication with Friends  and Family: Not on file  . Frequency of Social Gatherings with Friends and Family: Not on file  . Attends Religious Services: Not on file  . Active Member of Clubs or Organizations: Not on file  . Attends Archivist Meetings: Not on file  . Marital Status: Not on file   Allergies  Allergen Reactions  . Lipitor [Atorvastatin]     REACTION: myalgias  . Nitroglycerin    Family History  Problem Relation Age of Onset  . Hypertension Father   . Diabetes Father   . Asthma Father   . Cervical cancer Maternal Aunt   . Heart disease Maternal Aunt   . Anesthesia problems Neg Hx   . Colon cancer Neg Hx     Current Outpatient Medications (Endocrine & Metabolic):  Marland Kitchen  Insulin  Glargine (LANTUS SOLOSTAR) 100 UNIT/ML Solostar Pen, INJECT 40 UNITS INTO THE SKIN IN THE MORNING. .  insulin lispro (HUMALOG KWIKPEN) 100 UNIT/ML KwikPen, Inject 0.08-0.12 mLs (8-12 Units total) into the skin 3 (three) times daily. .  Semaglutide,0.25 or 0.5MG /DOS, (OZEMPIC, 0.25 OR 0.5 MG/DOSE,) 2 MG/1.5ML SOPN, Inject 0.5 mg into the skin once a week.  Current Outpatient Medications (Cardiovascular):  .  atenolol (TENORMIN) 25 MG tablet, Take 1 tablet (25 mg total) by mouth daily. X 7 days, then increase to 2 tabs (50mg ) daily .  rosuvastatin (CRESTOR) 40 MG tablet, TAKE 1 TABLET (40 MG TOTAL) BY MOUTH DAILY.  Current Outpatient Medications (Respiratory):  Marland Kitchen  Cetirizine HCl 10 MG CAPS, Take 1 capsule (10 mg total) by mouth daily for 15 days. .  fluticasone (FLONASE) 50 MCG/ACT nasal spray, Place 1-2 sprays into both nostrils daily for 7 days.  Current Outpatient Medications (Analgesics):  .  aspirin 81 MG EC tablet, Take 81 mg by mouth daily.   .  butalbital-acetaminophen-caffeine (FIORICET, ESGIC) 50-325-40 MG tablet, TAKE 1 TABLET BY MOUTH EVERY 6 HOURS AS NEEDED FOR HEADACHE (MAX OF 1 OR 2 TABLETS PER DAY) .  Galcanezumab-gnlm (EMGALITY) 120 MG/ML SOAJ, Inject 120 mg into the skin every 30 (thirty) days. [place] Succinate (REYVOW) 100 MG TABS, Take 100 mg by mouth as needed. Take 1 tablet for headache. No more than 1 tablet in 24 hours .  meloxicam (MOBIC) 15 MG tablet, TAKE 1 TABLET (15 MG TOTAL) BY MOUTH DAILY. .  traMADol (ULTRAM) 50 MG tablet, TAKE 1 TABLET BY MOUTH EVERY 8 HOURS AS NEEDED .  Ubrogepant (UBRELVY) 100 MG TABS, Take 1 tablet by mouth every 2 (two) hours as needed (Maximum 2 tablets/24 hours). .  SUMAtriptan Succinate (ZEMBRACE SYMTOUCH) 3 MG/0.5ML SOAJ, Inject 1 Device into the skin once. May repeat dose once after 1 hour if headache persists or returns (do not exceed two injections in 24 hours)   Current Outpatient Medications (Other):  .  Blood Glucose  Monitoring Suppl (FREESTYLE LITE) DEVI, Use as directed four times daily E11.9 .  cyclobenzaprine (FLEXERIL) 5 MG tablet, Take 1 tablet (5 mg total) by mouth 3 (three) times daily as needed for muscle spasms. .  DENTA 5000 PLUS 1.1 % CREA dental cream,  .  DULoxetine (CYMBALTA) 20 MG capsule, Take 1 capsule (20 mg total) by mouth daily. Marland Kitchen  esomeprazole (NEXIUM) 40 MG capsule, TAKE 1 CAPSULE BY MOUTH 2 TIMES DAILY BEFORE A MEAL. Marland Kitchen  estradiol (ESTRACE) 0.1 MG/GM vaginal cream,  .  gabapentin (NEURONTIN) 300 MG capsule, TAKE 1 CAPSULE BY MOUTH 2 TIMES DAILY. Marland Kitchen  glucose blood (FREESTYLE LITE)  test strip, Use as instructed four times daily E11.9 .  hydrOXYzine (ATARAX/VISTARIL) 10 MG tablet, Take 1 tablet (10 mg total) by mouth 3 (three) times daily as needed. .  Insulin Pen Needle (UNIFINE PENTIPS) 32G X 4 MM MISC, USE 4x a day .  Lancets (FREESTYLE) lancets, Use as instructed four times daily E11.9 .  linaclotide (LINZESS) 145 MCG CAPS capsule, Take 1 capsule (145 mcg total) by mouth daily before breakfast. .  methocarbamol (ROBAXIN) 500 MG tablet, Take 1 tablet (500 mg total) by mouth 2 (two) times daily as needed for muscle spasms. .  metoCLOPramide (REGLAN) 5 MG tablet, Take 1 tablet (5 mg total) by mouth every 8 (eight) hours as needed for nausea or vomiting. .  nystatin-triamcinolone ointment (MYCOLOG),  .  ondansetron (ZOFRAN) 4 MG tablet, TAKE 1 TABLET BY MOUTH EVERY 8 HOURS AS NEEDED FOR NAUSEA OR VOMITING. .  polyethylene glycol powder (GLYCOLAX/MIRALAX) 17 GM/SCOOP powder, Take 17 g by mouth 2 (two) times daily as needed.    Past medical history, social, surgical and family history all reviewed in electronic medical record.  No pertanent information unless stated regarding to the chief complaint.   Review of Systems:  No headache, visual changes, nausea, vomiting, diarrhea, constipation, dizziness, abdominal pain, skin rash, fevers, chills, night sweats, weight loss, swollen lymph  nodes, body aches, joint swelling, chest pain, shortness of breath, mood changes. POSITIVE muscle aches  Objective  Blood pressure 110/70, pulse (!) 104, height 5\' 1"  (1.549 m), weight 153 lb (69.4 kg), SpO2 94 %.   General: No apparent distress alert and oriented x3 mood and affect normal, dressed appropriately.  HEENT: Pupils equal, extraocular movements intact  Respiratory: Patient's speak in full sentences and does not appear short of breath  Cardiovascular: No lower extremity edema, non tender, no erythema  Skin: Warm dry intact with no signs of infection or rash on extremities or on axial skeleton.  Abdomen: Soft nontender  Neuro: Cranial nerves II through XII are intact, neurovascularly intact in all extremities with 2+ DTRs and 2+ pulses.  Lymph: No lymphadenopathy of posterior or anterior cervical chain or axillae bilaterally.  Gait normal with good balance and coordination.  MSK:  Patient's pain seems to be out of proportion to the amount of palpation. Patient has significant tightness in all musculature.  Patient is pain in the parascapular region.  Patient also pain in the paraspinal musculature of the lumbar spine.  Osteopathic findings C2 flexed rotated and side bent right C4 flexed rotated and side bent left C6 flexed rotated and side bent left T3 extended rotated and side bent right inhaled third rib T9 extended rotated and side bent left L2 flexed rotated and side bent right Sacrum right on right    Impression and Recommendations:     This case required medical decision making of moderate complexity. The above documentation has been reviewed and is accurate and complete Lyndal Pulley, DO       Note: This dictation was prepared with Dragon dictation along with smaller phrase technology. Any transcriptional errors that result from this process are unintentional.

## 2019-07-04 NOTE — Patient Instructions (Signed)
If knees are not better we will inject at follow up Consider vaccine Continue exercises See me again in 6 weeks

## 2019-07-04 NOTE — Assessment & Plan Note (Signed)
Decision today to treat with OMT was based on Physical Exam  After verbal consent patient was treated with HVLA, ME, FPR techniques in cervical, thoracic, rib, lumbar and sacral areas  Patient tolerated the procedure well with improvement in symptoms  Patient given exercises, stretches and lifestyle modifications  See medications in patient instructions if given  Patient will follow up in 6-8 weeks 

## 2019-08-07 NOTE — Assessment & Plan Note (Signed)
Patient is a chronic problem with mild exacerbation.  Cervicogenic headaches.  Patient also has some muscular imbalances that is contributing.  Social determinants of health including financial restraints and patient having to work significantly more at the moment.  Feels that is contributing to some of the aches and pains.  Discussed posture and ergonomics, proper lifting mechanics, increasing activity slowly.  Follow-up again in 4 to 8 weeks.

## 2019-08-07 NOTE — Progress Notes (Signed)
[facility] [place] Phone: [phone] Subjective:   Rachel [name], am serving as a scribe for Dr. Hulan Saas. This visit occurred during the SARS-CoV-2 public health emergency.  Safety protocols were in place, including screening questions prior to the visit, additional usage of staff PPE, and extensive cleaning of exam room while observing appropriate contact time as indicated for disinfecting solutions.      I'm seeing this patient by the request  of:  Biagio Borg, MD  CC: Neck and low back pain follow-up  [id number]  Rachel [name] is a 54 y.o. female coming in with complaint of neck and back pain. Last seen on 06/16/2019 for OMT. Patient states that she is having an increase in her pain since last visit. Has not been able to sleep for past 2 weeks. Pain is in bilateral trap muscles but radiates up into the occiput. Is getting headaches from the tightness. Notes crepitus with movement of her c-spine.        Past Medical History:  Diagnosis Date  . ALLERGIC RHINITIS 10/04/2007  . Anemia 01/21/2011  . ASTHMA 08/02/2007   INHALERS ONLY IN [place]  . Blood transfusion without reported diagnosis    with first child   . COMMON MIGRAINE 05/15/2009  . DIABETES MELLITUS, TYPE II 08/02/2007  . GERD 08/02/2007  . HYPERLIPIDEMIA 08/02/2007  . INSOMNIA-SLEEP DISORDER-UNSPEC 01/04/2008  . INTERMITTENT VERTIGO 05/15/2009  . LIBIDO, DECREASED 01/09/2010   Past Surgical History:  Procedure Laterality Date  . CESAREAN SECTION     x 3  . CYST EXCISION     left knee  . PAROTIDECTOMY  10/21/2011   Procedure: PAROTIDECTOMY;  Surgeon: Melida Quitter, MD;  Location: [facility] OR;  Service: ENT;  Laterality: Left;   Social History   Socioeconomic History  . Marital status: Married    Spouse name: [name]  . Number of children: 3  . Years of education: Degree  . Highest education level: Not on file  Occupational History  . Occupation:  Research scientist (life sciences): [employer]  . Occupation: Chartered certified accountant    Employer: [employer]  Tobacco Use  . Smoking status: Never Smoker  . Smokeless tobacco: Never Used  Substance and Sexual Activity  . Alcohol use: [name]    Alcohol/week: 0.0 standard drinks  . Drug use: [name]  . Sexual activity: Yes  Other Topics Concern  . Not on file  Social History Narrative   She has 3 daughters.   Patient has a 4 year degree.    Patient working at [employer].    Patient is married to [place].   Social Determinants of Health   Financial Resource Strain:   . Difficulty of Paying Living Expenses: This, guidance given resources given  Food Insecurity:   . Worried About Charity fundraiser in the Last Year: Not on file  . Ran Out of Food in the Last Year: Not on file  Transportation Needs:   . Lack of Transportation (Medical): Not on file  . Lack of Transportation (Non-Medical): Not on file  Physical Activity:   . Days of Exercise per Week: 1  . Minutes of Exercise per Session: 30  Stress:   . Feeling of Stress : Not on file  Social Connections:   . Frequency of Communication with Friends and Family: Not on file  . Frequency of Social Gatherings with Friends and Family: Not on file  .  Attends Religious Services: Not on file  . Active Member of Clubs or Organizations: Not on file  . Attends Archivist Meetings: Not on file  . Marital Status: Not on file   Allergies  Allergen Reactions  . Lipitor [Atorvastatin]     REACTION: myalgias  . Nitroglycerin    Family History  Problem Relation Age of Onset  . Hypertension Father   . Diabetes Father   . Asthma Father   . Cervical cancer Maternal Aunt   . Heart disease Maternal Aunt   . Anesthesia problems Neg Hx   . Colon cancer Neg Hx     Current Outpatient Medications (Endocrine & Metabolic):  Marland Kitchen  Insulin Glargine (LANTUS SOLOSTAR) 100 UNIT/ML Solostar Pen, INJECT 40 UNITS INTO THE SKIN IN THE MORNING. .  insulin lispro  (HUMALOG KWIKPEN) 100 UNIT/ML KwikPen, Inject 0.08-0.12 mLs (8-12 Units total) into the skin 3 (three) times daily. .  Semaglutide,0.25 or 0.5MG /DOS, (OZEMPIC, 0.25 OR 0.5 MG/DOSE,) 2 MG/1.5ML SOPN, Inject 0.5 mg into the skin once a week.  Current Outpatient Medications (Cardiovascular):  .  atenolol (TENORMIN) 25 MG tablet, Take 1 tablet (25 mg total) by mouth daily. X 7 days, then increase to 2 tabs (50mg ) daily .  rosuvastatin (CRESTOR) 40 MG tablet, TAKE 1 TABLET (40 MG TOTAL) BY MOUTH DAILY.  Current Outpatient Medications (Respiratory):  Marland Kitchen  Cetirizine HCl 10 MG CAPS, Take 1 capsule (10 mg total) by mouth daily for 15 days. .  fluticasone (FLONASE) 50 MCG/ACT nasal spray, Place 1-2 sprays into both nostrils daily for 7 days.  Current Outpatient Medications (Analgesics):  .  aspirin 81 MG EC tablet, Take 81 mg by mouth daily.   .  butalbital-acetaminophen-caffeine (FIORICET, ESGIC) 50-325-40 MG tablet, TAKE 1 TABLET BY MOUTH EVERY 6 HOURS AS NEEDED FOR HEADACHE (MAX OF 1 OR 2 TABLETS PER DAY) .  Galcanezumab-gnlm (EMGALITY) 120 MG/ML SOAJ, Inject 120 mg into the skin every 30 (thirty) days. [place] Succinate (REYVOW) 100 MG TABS, Take 100 mg by mouth as needed. Take 1 tablet for headache. [name] more than 1 tablet in 24 hours .  meloxicam (MOBIC) 15 MG tablet, TAKE 1 TABLET (15 MG TOTAL) BY MOUTH DAILY. .  traMADol (ULTRAM) 50 MG tablet, TAKE 1 TABLET BY MOUTH EVERY 8 HOURS AS NEEDED .  Ubrogepant (UBRELVY) 100 MG TABS, Take 1 tablet by mouth every 2 (two) hours as needed (Maximum 2 tablets/24 hours). .  SUMAtriptan Succinate (ZEMBRACE SYMTOUCH) 3 MG/0.5ML SOAJ, Inject 1 Device into the skin once. May repeat dose once after 1 hour if headache persists or returns (do not exceed two injections in 24 hours)   Current Outpatient Medications (Other):  .  Blood Glucose Monitoring Suppl (FREESTYLE LITE) DEVI, Use as directed four times daily E11.9 .  cyclobenzaprine (FLEXERIL) 5 MG tablet,  Take 1 tablet (5 mg total) by mouth 3 (three) times daily as needed for muscle spasms. .  DENTA 5000 PLUS 1.1 % CREA dental cream,  .  DULoxetine (CYMBALTA) 20 MG capsule, Take 1 capsule (20 mg total) by mouth daily. Marland Kitchen  esomeprazole (NEXIUM) 40 MG capsule, TAKE 1 CAPSULE BY MOUTH 2 TIMES DAILY BEFORE A MEAL. Marland Kitchen  estradiol (ESTRACE) 0.1 MG/GM vaginal cream,  .  gabapentin (NEURONTIN) 300 MG capsule, TAKE 1 CAPSULE BY MOUTH 2 TIMES DAILY. Marland Kitchen  glucose blood (FREESTYLE LITE) test strip, Use as instructed four times daily E11.9 .  hydrOXYzine (ATARAX/VISTARIL) 10 MG tablet, Take 1 tablet (10  mg total) by mouth 3 (three) times daily as needed. .  Insulin Pen Needle (UNIFINE PENTIPS) 32G X 4 MM MISC, USE 4x a day .  Lancets (FREESTYLE) lancets, Use as instructed four times daily E11.9 .  linaclotide (LINZESS) 145 MCG CAPS capsule, Take 1 capsule (145 mcg total) by mouth daily before breakfast. .  methocarbamol (ROBAXIN) 500 MG tablet, Take 1 tablet (500 mg total) by mouth 2 (two) times daily as needed for muscle spasms. .  metoCLOPramide (REGLAN) 5 MG tablet, Take 1 tablet (5 mg total) by mouth every 8 (eight) hours as needed for nausea or vomiting. .  nystatin-triamcinolone ointment (MYCOLOG),  .  ondansetron (ZOFRAN) 4 MG tablet, TAKE 1 TABLET BY MOUTH EVERY 8 HOURS AS NEEDED FOR NAUSEA OR VOMITING. .  polyethylene glycol powder (GLYCOLAX/MIRALAX) 17 GM/SCOOP powder, Take 17 g by mouth 2 (two) times daily as needed. .  DULoxetine (CYMBALTA) 60 MG capsule, Take 1 capsule (60 mg total) by mouth daily.   Reviewed prior external information including notes and imaging from  primary care provider As well as notes that were available from care everywhere and other healthcare systems.  Past medical history, social, surgical and family history all reviewed in electronic medical record.  [name] pertanent information unless stated regarding to the chief complaint.   Review of Systems:  [name]  visual changes,  nausea, vomiting, diarrhea, constipation, dizziness, abdominal pain, skin rash, fevers, chills, night sweats, weight loss, swollen lymph nodes,, joint swelling, chest pain, shortness of breath, mood changes. POSITIVE muscle aches, headaches, body aches  Objective  Blood pressure 118/86, pulse (!) 113, height 5\' 1"  (1.549 m), weight 152 lb (68.9 kg), SpO2 99 %.   General: [name] apparent distress alert and oriented x3 mood and affect normal, dressed appropriately.  HEENT: Pupils equal, extraocular movements intact  Respiratory: Patient's speak in full sentences and does not appear short of breath  Cardiovascular: [name] lower extremity edema, non tender, [name] erythema  Skin: Warm dry intact with [name] signs of infection or rash on extremities or on axial skeleton.  Abdomen: Soft nontender  Neuro: Cranial nerves II through XII are intact, neurovascularly intact in all extremities with 2+ DTRs and 2+ pulses.  Lymph: [name] lymphadenopathy of posterior or anterior cervical chain or axillae bilaterally.  Gait normal with good balance and coordination.  MSK:  Non tender with full range of motion and good stability and symmetric strength and tone of shoulders, elbows, wrist, hip, knee and ankles bilaterally.  Neck exam does have some loss of lordosis.  Diffusely tender to palpation.  Out of proportion to the amount of palpation.  Patient is some mild limited range of motion with voluntary guarding.  Negative Spurling's today. Low back exam loss of lordosis.  Tender to palpation of paraspinal musculature of the lumbar spine.  Tightness with Corky Sox test and tightness with straight leg test but [name] true radicular symptoms.  4+ out of 5 strength of the lower extremities but symmetric.  Osteopathic findings  C2 flexed rotated and side bent right C6 flexed rotated and side bent left T3 extended rotated and side bent right inhaled third rib T9 extended rotated and side bent left L2 flexed rotated and side bent right Sacrum  right on right    Impression and Recommendations:     This case required medical decision making of moderate complexity. The above documentation has been reviewed and is accurate and complete Rachel Pulley, DO       Note: This  dictation was prepared with Dragon dictation along with smaller phrase technology. Any transcriptional errors that result from this process are unintentional.

## 2019-08-07 NOTE — Assessment & Plan Note (Signed)
Decision today to treat with OMT was based on Physical Exam  After verbal consent patient was treated with HVLA, ME, FPR techniques in cervical, thoracic, rib,  lumbar and sacral areas  Patient tolerated the procedure well with improvement in symptoms  Patient given exercises, stretches and lifestyle modifications  See medications in patient instructions if given  Patient will follow up in 4-8 weeks 

## 2019-08-08 ENCOUNTER — Encounter: Payer: Self-pay | Admitting: Family Medicine

## 2019-08-08 ENCOUNTER — Other Ambulatory Visit: Payer: Self-pay

## 2019-08-08 ENCOUNTER — Ambulatory Visit (INDEPENDENT_AMBULATORY_CARE_PROVIDER_SITE_OTHER): Payer: 59 | Admitting: Family Medicine

## 2019-08-08 VITALS — BP 118/86 | HR 113 | Ht 61.0 in | Wt 152.0 lb

## 2019-08-08 DIAGNOSIS — G4486 Cervicogenic headache: Secondary | ICD-10-CM

## 2019-08-08 DIAGNOSIS — M999 Biomechanical lesion, unspecified: Secondary | ICD-10-CM

## 2019-08-08 DIAGNOSIS — M5412 Radiculopathy, cervical region: Secondary | ICD-10-CM

## 2019-08-08 DIAGNOSIS — R519 Headache, unspecified: Secondary | ICD-10-CM

## 2019-08-08 MED ORDER — KETOROLAC TROMETHAMINE 60 MG/2ML IM SOLN
60.0000 mg | Freq: Once | INTRAMUSCULAR | Status: AC
  Administered 2019-08-08: 09:00:00 60 mg via INTRAMUSCULAR

## 2019-08-08 MED ORDER — METHYLPREDNISOLONE ACETATE 80 MG/ML IJ SUSP
80.0000 mg | Freq: Once | INTRAMUSCULAR | Status: AC
  Administered 2019-08-08: 09:00:00 80 mg via INTRAMUSCULAR

## 2019-08-08 MED ORDER — DULOXETINE HCL 60 MG PO CPEP: 60.0000 mg | ORAL_CAPSULE | Freq: Every day | ORAL | 3 refills | Status: DC

## 2019-08-08 NOTE — Patient Instructions (Signed)
Cymbalta 60 mg See me again in 4 weeks

## 2019-08-15 ENCOUNTER — Ambulatory Visit: Payer: 59 | Admitting: Family Medicine

## 2019-08-28 NOTE — Progress Notes (Signed)
NEUROLOGY FOLLOW UP OFFICE NOTE  [name] [id number]  HISTORY OF PRESENT ILLNESS: Rachel [name] a 54 year old right-handed female with type 2 diabetes, hyperlipidemia, GERD, asthma, and migraine who follows up for migraine.  UPDATE: Due to worsening headaches, Aimovig was switched to [facility] in September.  With [employer], she has no headaches for 1 to 2 weeks but then has near daily headaches. They are severe.  She usually gets 3 in one week, 16 in month 2 hours then all day.  She never tried [place].  Current NSAIDS:none Current analgesics:Excedrin Migraine, Tramadol (for back pain, ineffective for headache) Current triptans:None Current ergotamine:None Current anti-emetic:Reglan 10mg  Current muscle relaxants:Flexeril Current anti-anxiolytic:None Current sleep aide:None Current Antihypertensive medications:None Current Antidepressant medications:none Current Anticonvulsant medications:Gabapentin 300 mg twice daily (for back pain) Current anti-CGRP:Emgality Current Vitamins/Herbal/Supplements:Turmeric Current Antihistamines/Decongestants:None Other therapy:She continues to see Dr. [name] for OMT regarding chronic neck with cervical radiculopathy and back pain.  Caffeine:One cup of coffee daily Diet:Drinks plenty of water. No soda. Exercise:No Depression:No; Anxiety:yes. Her mother has been ill. She has been confused.  Other pain:Knee pain. Sleep hygiene:Poor. Worrying about her mother.  HISTORY: Onset:2011.She does have remote history of headaches when she was younger. Location:Back of head and radiates to the front Quality:Shooting up from back of head, pounding on top and front of head Initial Intensity:8/10 Aura:no Prodrome:no Associated symptoms:Nausea, photophobia, phonophobia, blurred vision (she gets annual eye exams due to diabetes) Initial Duration:All day Initial Frequency:Almost daily (cannot function 6  days per month) Triggers/exacerbating factors:wine Relieving factors:No Activity:Cannot work about 6 days per month (she works as an Advertising account planner)  Past NSAIDS:Cambia (made headache worse), flurbiprofen, Sprix nasal spray, ibuprofen; naproxen Past analgesics:Fioricet, Excedrin, Tylenol Past abortive triptans:Sumatriptan 100 mg, Zembrace-SymTouch (did not like the feeling), Zomig 5 mg NS (did not like the way it made her feel), Maxalt (dizziness, ineffective) Past abortive ergotamine:Cafergot Past muscle relaxants:Robaxin Past anti-emetic:Zofran 8mg  Past antihypertensive medications:Atenolol Past antidepressant medications:Venlafaxine Exar 150 mg; Cymbalta 20mg  daily Past anticonvulsant medications:Topiramate, Depakote Past anti-CGRP:Aimovig 140mg  Past vitamins/Herbal/Supplements:None Past antihistamines/decongestants:None Other past therapies:Botox  Frequent Falls: For the past few years, she has had falls, but they have become more frequent over the past year. Previously, she was diagnosed with vertigo. However, she reports that she doesn't note spinning or lightheadedness. She says she "just falls". On one occasion, she was pushing the seat forward while sitting and just fell over. Otherwise, it always occurs while walking or on her feet. She denies double vision or focal numbness or weakness. MRI of the brain with seizure protocol was performed on 08/02/11, which was unremarkable. Sleep deprived EEG was normal. She has had PT, which was ineffective. She does report that she sometimes trips while walking but doesn't fall. Cervical plain films from 05/07/15 showed mild degenerative disc disease at C5-6, but otherwise unremarkable. Lumbar films from 06/10/15 were personally reviewed and were negative. She underwent anothner MRI of brain with and without contrast on 02/10/16, and was stable compared to prior MRI from 2013. NCV-EMG from 05/13/16 was  negative for neuropathy or lumbosacral radiculopathy.   PAST MEDICAL HISTORY: Past Medical History:  Diagnosis Date  . ALLERGIC RHINITIS 10/04/2007  . Anemia 01/21/2011  . ASTHMA 08/02/2007   INHALERS ONLY IN [name]  . Blood transfusion without reported diagnosis    with first child   . COMMON MIGRAINE 05/15/2009  . DIABETES MELLITUS, TYPE II 08/02/2007  . GERD 08/02/2007  . HYPERLIPIDEMIA 08/02/2007  . INSOMNIA-SLEEP DISORDER-UNSPEC 01/04/2008  . INTERMITTENT VERTIGO 05/15/2009  .  LIBIDO, DECREASED 01/09/2010    MEDICATIONS: Current Outpatient Medications on File Prior to Visit  Medication Sig Dispense Refill  . aspirin 81 MG EC tablet Take 81 mg by mouth daily.      Marland Kitchen atenolol (TENORMIN) 25 MG tablet Take 1 tablet (25 mg total) by mouth daily. X 7 days, then increase to 2 tabs (50mg ) daily 60 tablet 3  . Blood Glucose Monitoring Suppl (FREESTYLE LITE) DEVI Use as directed four times daily E11.9 1 each 0  . butalbital-acetaminophen-caffeine (FIORICET, ESGIC) 50-325-40 MG tablet TAKE 1 TABLET BY MOUTH EVERY 6 HOURS AS NEEDED FOR HEADACHE (MAX OF 1 OR 2 TABLETS PER DAY) 30 tablet 5  . Cetirizine HCl 10 MG CAPS Take 1 capsule (10 mg total) by mouth daily for 15 days. 15 capsule 0  . cyclobenzaprine (FLEXERIL) 5 MG tablet Take 1 tablet (5 mg total) by mouth 3 (three) times daily as needed for muscle spasms. 90 tablet 2  . DENTA 5000 PLUS 1.1 % CREA dental cream   3  . DULoxetine (CYMBALTA) 20 MG capsule Take 1 capsule (20 mg total) by mouth daily. 30 capsule 0  . DULoxetine (CYMBALTA) 60 MG capsule Take 1 capsule (60 mg total) by mouth daily. 30 capsule 3  . esomeprazole (NEXIUM) 40 MG capsule TAKE 1 CAPSULE BY MOUTH 2 TIMES DAILY BEFORE A MEAL. 180 capsule 3  . estradiol (ESTRACE) 0.1 MG/GM vaginal cream     . fluticasone (FLONASE) 50 MCG/ACT nasal spray Place 1-2 sprays into both nostrils daily for 7 days. 1 g 0  . gabapentin (NEURONTIN) 300 MG capsule TAKE 1 CAPSULE BY MOUTH 2 TIMES DAILY.  60 capsule 3  . Galcanezumab-gnlm (EMGALITY) 120 MG/ML SOAJ Inject 120 mg into the skin every 30 (thirty) days. 1 pen 11  . glucose blood (FREESTYLE LITE) test strip Use as instructed four times daily E11.9 400 each 12  . hydrOXYzine (ATARAX/VISTARIL) 10 MG tablet Take 1 tablet (10 mg total) by mouth 3 (three) times daily as needed. 30 tablet 0  . Insulin Glargine (LANTUS SOLOSTAR) 100 UNIT/ML Solostar Pen INJECT 40 UNITS INTO THE SKIN IN THE MORNING. 33 mL 1  . insulin lispro (HUMALOG KWIKPEN) 100 UNIT/ML KwikPen Inject 0.08-0.12 mLs (8-12 Units total) into the skin 3 (three) times daily. 15 mL 11  . Insulin Pen Needle (UNIFINE PENTIPS) 32G X 4 MM MISC USE 4x a day 200 each PRN  . Lancets (FREESTYLE) lancets Use as instructed four times daily E11.9 400 each 12  . Lasmiditan Succinate (REYVOW) 100 MG TABS Take 100 mg by mouth as needed. Take 1 tablet for headache. No more than 1 tablet in 24 hours 8 tablet 3  . linaclotide (LINZESS) 145 MCG CAPS capsule Take 1 capsule (145 mcg total) by mouth daily before breakfast. 30 capsule 2  . meloxicam (MOBIC) 15 MG tablet TAKE 1 TABLET (15 MG TOTAL) BY MOUTH DAILY. 90 tablet 0  . methocarbamol (ROBAXIN) 500 MG tablet Take 1 tablet (500 mg total) by mouth 2 (two) times daily as needed for muscle spasms. 30 tablet 2  . metoCLOPramide (REGLAN) 5 MG tablet Take 1 tablet (5 mg total) by mouth every 8 (eight) hours as needed for nausea or vomiting. 40 tablet 1  . nystatin-triamcinolone ointment (MYCOLOG)     . ondansetron (ZOFRAN) 4 MG tablet TAKE 1 TABLET BY MOUTH EVERY 8 HOURS AS NEEDED FOR NAUSEA OR VOMITING. 40 tablet 1  . polyethylene glycol powder (GLYCOLAX/MIRALAX) 17 GM/SCOOP powder Take  17 g by mouth 2 (two) times daily as needed. 3350 g 1  . rosuvastatin (CRESTOR) 40 MG tablet TAKE 1 TABLET (40 MG TOTAL) BY MOUTH DAILY. 90 tablet 3  . Semaglutide,0.25 or 0.5MG /DOS, (OZEMPIC, 0.25 OR 0.5 MG/DOSE,) 2 MG/1.5ML SOPN Inject 0.5 mg into the skin once a week. 2  pen 5  . SUMAtriptan Succinate (ZEMBRACE SYMTOUCH) 3 MG/0.5ML SOAJ Inject 1 Device into the skin once. May repeat dose once after 1 hour if headache persists or returns (do not exceed two injections in 24 hours) 9 pen 0  . traMADol (ULTRAM) 50 MG tablet TAKE 1 TABLET BY MOUTH EVERY 8 HOURS AS NEEDED 60 tablet 1  . Ubrogepant (UBRELVY) 100 MG TABS Take 1 tablet by mouth every 2 (two) hours as needed (Maximum 2 tablets/24 hours). 20 tablet 5   No current facility-administered medications on file prior to visit.    ALLERGIES: Allergies  Allergen Reactions  . Lipitor [Atorvastatin]     REACTION: myalgias  . Nitroglycerin     FAMILY HISTORY: Family History  Problem Relation Age of Onset  . Hypertension Father   . Diabetes Father   . Asthma Father   . Cervical cancer Maternal Aunt   . Heart disease Maternal Aunt   . Anesthesia problems Neg Hx   . Colon cancer Neg Hx     SOCIAL HISTORY: Social History   Socioeconomic History  . Marital status: Married    Spouse name: [name]  . Number of children: 3  . Years of education: Degree  . Highest education level: Not on file  Occupational History  . Occupation: Research scientist (life sciences): [employer]  . Occupation: Chartered certified accountant    Employer: [employer]  Tobacco Use  . Smoking status: Never Smoker  . Smokeless tobacco: Never Used  Substance and Sexual Activity  . Alcohol use: No    Alcohol/week: 0.0 standard drinks  . Drug use: No  . Sexual activity: Yes  Other Topics Concern  . Not on file  Social History Narrative   She has 3 daughters.   Patient has a 4 year degree.    Patient working at [employer].    Patient is married to [place].   Social Determinants of Health   Financial Resource Strain:   . Difficulty of Paying Living Expenses:   Food Insecurity:   . Worried About Charity fundraiser in the Last Year:   . Arboriculturist in the Last Year:   Transportation Needs:   . Film/video editor (Medical):   Marland Kitchen  Lack of Transportation (Non-Medical):   Physical Activity:   . Days of Exercise per Week:   . Minutes of Exercise per Session:   Stress:   . Feeling of Stress :   Social Connections:   . Frequency of Communication with Friends and Family:   . Frequency of Social Gatherings with Friends and Family:   . Attends Religious Services:   . Active Member of Clubs or Organizations:   . Attends Archivist Meetings:   Marland Kitchen Marital Status:   Intimate Partner Violence:   . Fear of Current or Ex-Partner:   . Emotionally Abused:   Marland Kitchen Physically Abused:   . Sexually Abused:     REVIEW OF SYSTEMS: Constitutional: No fevers, chills, or sweats, no generalized fatigue, change in appetite Eyes: No visual changes, double vision, eye pain Ear, nose and throat: No hearing loss, ear pain, nasal congestion,  sore throat Cardiovascular: No chest pain, palpitations Respiratory:  No shortness of breath at rest or with exertion, wheezes GastrointestinaI: No nausea, vomiting, diarrhea, abdominal pain, fecal incontinence Genitourinary:  No dysuria, urinary retention or frequency Musculoskeletal:  No neck pain, back pain Integumentary: No rash, pruritus, skin lesions Neurological: as above Psychiatric: No depression, insomnia, anxiety Endocrine: No palpitations, fatigue, diaphoresis, mood swings, change in appetite, change in weight, increased thirst Hematologic/Lymphatic:  No purpura, petechiae. Allergic/Immunologic: no itchy/runny eyes, nasal congestion, recent allergic reactions, rashes  PHYSICAL EXAM: Blood pressure 108/74, pulse (!) 110, temperature (!) 97.2 F (36.2 C), height 5\' 1"  (1.549 m), weight 152 lb (68.9 kg), SpO2 99 %. General: No acute distress.  Patient appears well-groomed.   Head:  Normocephalic/atraumatic Eyes:  Fundi examined but not visualized Neck: supple, no paraspinal tenderness, full range of motion Heart:  Regular rate and rhythm Lungs:  Clear to auscultation  bilaterally Back: No paraspinal tenderness Neurological Exam: alert and oriented to person, place, and time. Attention span and concentration intact, recent and remote memory intact, fund of knowledge intact.  Speech fluent and not dysarthric, language intact.  CN II-XII intact. Bulk and tone normal, muscle strength 5/5 throughout.  Sensation to light touch, temperature and vibration intact.  Deep tendon reflexes 2+ throughout, toes downgoing.  Finger to nose and heel to shin testing intact.  Gait normal, Romberg negative.  IMPRESSION: 1.  Chronic mgraine without aura, without status migrainosus, not intractable 2.  Cervicalgia  PLAN: 1.  For preventative management, change from Emgality to Ajovy 2.  For abortive therapy, she will try Nurtec as she has significant nausea. 3.  Refill gabapentin 300mg  twice daily for neck pain. 4.  Limit use of pain relievers to no more than 2 days out of week to prevent risk of rebound or medication-overuse headache. 5.  Keep headache diary 6.  Exercise, hydration, caffeine cessation, sleep hygiene, monitor for and avoid triggers 7.  Consider:  magnesium citrate 400mg  daily, riboflavin 400mg  daily, and coenzyme Q10 100mg  three times daily 8. Always keep in mind that currently taking a hormone or birth control may be a possible trigger or aggravating factor for migraine. 9. Follow up 6 months.   Metta Clines, DO  CC: Cathlean Cower, MD

## 2019-08-29 ENCOUNTER — Other Ambulatory Visit: Payer: Self-pay | Admitting: Neurology

## 2019-08-29 ENCOUNTER — Encounter: Payer: Self-pay | Admitting: Neurology

## 2019-08-29 ENCOUNTER — Other Ambulatory Visit: Payer: Self-pay

## 2019-08-29 ENCOUNTER — Encounter: Payer: Self-pay | Admitting: Internal Medicine

## 2019-08-29 ENCOUNTER — Ambulatory Visit (INDEPENDENT_AMBULATORY_CARE_PROVIDER_SITE_OTHER): Payer: 59 | Admitting: Neurology

## 2019-08-29 ENCOUNTER — Ambulatory Visit (INDEPENDENT_AMBULATORY_CARE_PROVIDER_SITE_OTHER): Payer: 59 | Admitting: Internal Medicine

## 2019-08-29 VITALS — BP 108/74 | HR 110 | Temp 97.2°F | Ht 61.0 in | Wt 152.0 lb

## 2019-08-29 VITALS — BP 118/72 | HR 74 | Temp 97.2°F | Ht 61.0 in | Wt 152.0 lb

## 2019-08-29 DIAGNOSIS — Z794 Long term (current) use of insulin: Secondary | ICD-10-CM

## 2019-08-29 DIAGNOSIS — E1165 Type 2 diabetes mellitus with hyperglycemia: Secondary | ICD-10-CM

## 2019-08-29 DIAGNOSIS — Z0000 Encounter for general adult medical examination without abnormal findings: Secondary | ICD-10-CM

## 2019-08-29 DIAGNOSIS — G43709 Chronic migraine without aura, not intractable, without status migrainosus: Secondary | ICD-10-CM | POA: Diagnosis not present

## 2019-08-29 DIAGNOSIS — E559 Vitamin D deficiency, unspecified: Secondary | ICD-10-CM

## 2019-08-29 DIAGNOSIS — E611 Iron deficiency: Secondary | ICD-10-CM

## 2019-08-29 DIAGNOSIS — E538 Deficiency of other specified B group vitamins: Secondary | ICD-10-CM | POA: Diagnosis not present

## 2019-08-29 DIAGNOSIS — M542 Cervicalgia: Secondary | ICD-10-CM

## 2019-08-29 LAB — URINALYSIS, ROUTINE W REFLEX MICROSCOPIC
Bilirubin Urine: NEGATIVE
Hgb urine dipstick: NEGATIVE
Ketones, ur: NEGATIVE
Nitrite: NEGATIVE
Specific Gravity, Urine: 1.02 (ref 1.000–1.030)
Total Protein, Urine: NEGATIVE
Urine Glucose: >=1000 — AB
Urobilinogen, UA: 0.2 (ref 0.0–1.0)
pH: 6.5 (ref 5.0–8.0)

## 2019-08-29 LAB — MICROALBUMIN / CREATININE URINE RATIO
Creatinine,U: 106.3 mg/dL
Microalb Creat Ratio: 2 mg/g (ref 0.0–30.0)
Microalb, Ur: 2.1 mg/dL — ABNORMAL HIGH (ref 0.0–1.9)

## 2019-08-29 LAB — HEPATIC FUNCTION PANEL
ALT: 13 U/L (ref 0–35)
AST: 11 U/L (ref 0–37)
Albumin: 4.2 g/dL (ref 3.5–5.2)
Alkaline Phosphatase: 69 U/L (ref 39–117)
Bilirubin, Direct: 0.1 mg/dL (ref 0.0–0.3)
Total Bilirubin: 0.4 mg/dL (ref 0.2–1.2)
Total Protein: 7.3 g/dL (ref 6.0–8.3)

## 2019-08-29 LAB — CBC WITH DIFFERENTIAL/PLATELET
Basophils Absolute: 0 10*3/uL (ref 0.0–0.1)
Basophils Relative: 0.2 % (ref 0.0–3.0)
Eosinophils Absolute: 0 10*3/uL (ref 0.0–0.7)
Eosinophils Relative: 0.5 % (ref 0.0–5.0)
HCT: 37.7 % (ref 36.0–46.0)
Hemoglobin: 12.3 g/dL (ref 12.0–15.0)
Lymphocytes Relative: 48.8 % — ABNORMAL HIGH (ref 12.0–46.0)
Lymphs Abs: 3.5 10*3/uL (ref 0.7–4.0)
MCHC: 32.7 g/dL (ref 30.0–36.0)
MCV: 82.3 fl (ref 78.0–100.0)
Monocytes Absolute: 0.5 10*3/uL (ref 0.1–1.0)
Monocytes Relative: 7.4 % (ref 3.0–12.0)
Neutro Abs: 3.1 10*3/uL (ref 1.4–7.7)
Neutrophils Relative %: 43.1 % (ref 43.0–77.0)
Platelets: 428 10*3/uL — ABNORMAL HIGH (ref 150.0–400.0)
RBC: 4.58 Mil/uL (ref 3.87–5.11)
RDW: 13.7 % (ref 11.5–15.5)
WBC: 7.1 10*3/uL (ref 4.0–10.5)

## 2019-08-29 LAB — LIPID PANEL
Cholesterol: 189 mg/dL (ref 0–200)
HDL: 64.1 mg/dL (ref >39.00)
LDL Cholesterol: 108 mg/dL — ABNORMAL HIGH (ref 0–99)
NonHDL: 125.3
Total CHOL/HDL Ratio: 3
Triglycerides: 88 mg/dL (ref 0.0–149.0)
VLDL: 17.6 mg/dL (ref 0.0–40.0)

## 2019-08-29 LAB — BASIC METABOLIC PANEL
BUN: 13 mg/dL (ref 6–23)
CO2: 27 mEq/L (ref 19–32)
Calcium: 9.7 mg/dL (ref 8.4–10.5)
Chloride: 102 mEq/L (ref 96–112)
Creatinine, Ser: 0.67 mg/dL (ref 0.40–1.20)
GFR: 91.29 mL/min (ref >60.00)
Glucose, Bld: 230 mg/dL — ABNORMAL HIGH (ref 70–99)
Potassium: 4.2 mEq/L (ref 3.5–5.1)
Sodium: 137 mEq/L (ref 135–145)

## 2019-08-29 LAB — TSH: TSH: 0.76 u[IU]/mL (ref 0.35–4.50)

## 2019-08-29 LAB — VITAMIN D 25 HYDROXY (VIT D DEFICIENCY, FRACTURES): VITD: 33.27 ng/mL (ref 30.00–100.00)

## 2019-08-29 LAB — IBC PANEL
Iron: 90 ug/dL (ref 42–145)
Saturation Ratios: 23.6 % (ref 20.0–50.0)
Transferrin: 272 mg/dL (ref 212.0–360.0)

## 2019-08-29 LAB — VITAMIN B12: Vitamin B-12: 365 pg/mL (ref 211–911)

## 2019-08-29 LAB — HEMOGLOBIN A1C: Hgb A1c MFr Bld: 10 % — ABNORMAL HIGH (ref 4.6–6.5)

## 2019-08-29 MED ORDER — GABAPENTIN 300 MG PO CAPS: ORAL_CAPSULE | ORAL | 5 refills | Status: DC

## 2019-08-29 MED ORDER — AJOVY 225 MG/1.5ML ~~LOC~~ SOAJ: 225.0000 mg | SUBCUTANEOUS | 11 refills | Status: DC

## 2019-08-29 NOTE — Assessment & Plan Note (Signed)
stable overall by history and exam, recent data reviewed with pt, and pt to continue medical treatment as before,  to f/u any worsening symptoms or concerns  

## 2019-08-29 NOTE — Patient Instructions (Signed)
1.  Stop Emgality.  Instead start Ajovy 1injection every 30 days 2.  For rescue, take Nurtec dissolvable pill.  1 tablet as needed daily (no more than 1 tablet in 24 hours).  If effective, contact us for prescription and use copay card 3.  Gabapentin refilled 4.  Follow up in 6 months.

## 2019-08-29 NOTE — Progress Notes (Signed)
Subjective:    Patient ID: Rachel [name], female    DOB: May 29, 1966, 54 y.o.   MRN: [id number]  HPI  Here for wellness and f/u;  Overall doing ok;  Pt denies Chest pain, worsening SOB, DOE, wheezing, orthopnea, PND, worsening LE edema, palpitations, dizziness or syncope.  Pt denies neurological change such as new headache, facial or extremity weakness.  Pt denies polydipsia, polyuria, or low sugar symptoms. Pt states overall good compliance with treatment and medications, good tolerability, and has been trying to follow appropriate diet.  Pt denies worsening depressive symptoms, suicidal ideation or panic. No fever, night sweats, wt loss, loss of appetite, or other constitutional symptoms.  Pt states good ability with ADL's, has low fall risk, home safety reviewed and adequate, no other significant changes in hearing or vision, and only occasionally active with exercise.  Still with pain to knees to walk down the stairs, chronic pain to neck.  Saw neurology Dr Tomi Likens - to start Ajovy, and Nurtec prn.  Now in between jobs as [employer] closed.  Looking for a new position. Does not want covid shots. Past Medical History:  Diagnosis Date  . ALLERGIC RHINITIS 10/04/2007  . Anemia 01/21/2011  . ASTHMA 08/02/2007   INHALERS ONLY IN [place]  . Blood transfusion without reported diagnosis    with first child   . COMMON MIGRAINE 05/15/2009  . DIABETES MELLITUS, TYPE II 08/02/2007  . GERD 08/02/2007  . HYPERLIPIDEMIA 08/02/2007  . INSOMNIA-SLEEP DISORDER-UNSPEC 01/04/2008  . INTERMITTENT VERTIGO 05/15/2009  . LIBIDO, DECREASED 01/09/2010   Past Surgical History:  Procedure Laterality Date  . CESAREAN SECTION     x 3  . CYST EXCISION     left knee  . PAROTIDECTOMY  10/21/2011   Procedure: PAROTIDECTOMY;  Surgeon: Melida Quitter, MD;  Location: [facility];  Service: ENT;  Laterality: Left;    reports that she has never smoked. She has never used smokeless tobacco. She reports that she does not drink alcohol  or use drugs. family history includes Asthma in her father; Cervical cancer in her maternal aunt; Diabetes in her father; Heart disease in her maternal aunt; Hypertension in her father. Allergies  Allergen Reactions  . Lipitor [Atorvastatin]     REACTION: myalgias  . Nitroglycerin    Current Outpatient Medications on File Prior to Visit  Medication Sig Dispense Refill  . aspirin 81 MG EC tablet Take 81 mg by mouth daily.      . Blood Glucose Monitoring Suppl (FREESTYLE LITE) DEVI Use as directed four times daily E11.9 1 each 0  . butalbital-acetaminophen-caffeine (FIORICET, ESGIC) 50-325-40 MG tablet TAKE 1 TABLET BY MOUTH EVERY 6 HOURS AS NEEDED FOR HEADACHE (MAX OF 1 OR 2 TABLETS PER DAY) 30 tablet 5  . cyclobenzaprine (FLEXERIL) 5 MG tablet Take 1 tablet (5 mg total) by mouth 3 (three) times daily as needed for muscle spasms. 90 tablet 2  . DENTA 5000 PLUS 1.1 % CREA dental cream   3  . esomeprazole (NEXIUM) 40 MG capsule TAKE 1 CAPSULE BY MOUTH 2 TIMES DAILY BEFORE A MEAL. 180 capsule 3  . glucose blood (FREESTYLE LITE) test strip Use as instructed four times daily E11.9 400 each 12  . Insulin Glargine (LANTUS SOLOSTAR) 100 UNIT/ML Solostar Pen INJECT 40 UNITS INTO THE SKIN IN THE MORNING. 33 mL 1  . insulin lispro (HUMALOG KWIKPEN) 100 UNIT/ML KwikPen Inject 0.08-0.12 mLs (8-12 Units total) into the skin 3 (three) times daily. 15 mL  11  . Insulin Pen Needle (UNIFINE PENTIPS) 32G X 4 MM MISC USE 4x a day 200 each PRN  . Lancets (FREESTYLE) lancets Use as instructed four times daily E11.9 400 each 12  . linaclotide (LINZESS) 145 MCG CAPS capsule Take 1 capsule (145 mcg total) by mouth daily before breakfast. 30 capsule 2  . metoCLOPramide (REGLAN) 5 MG tablet Take 1 tablet (5 mg total) by mouth every 8 (eight) hours as needed for nausea or vomiting. 40 tablet 1  . rosuvastatin (CRESTOR) 40 MG tablet TAKE 1 TABLET (40 MG TOTAL) BY MOUTH DAILY. 90 tablet 3  . Semaglutide,0.25 or 0.5MG /DOS,  (OZEMPIC, 0.25 OR 0.5 MG/DOSE,) 2 MG/1.5ML SOPN Inject 0.5 mg into the skin once a week. 2 pen 5  . traMADol (ULTRAM) 50 MG tablet TAKE 1 TABLET BY MOUTH EVERY 8 HOURS AS NEEDED 60 tablet 1  . atenolol (TENORMIN) 25 MG tablet Take 1 tablet (25 mg total) by mouth daily. X 7 days, then increase to 2 tabs (50mg ) daily (Patient not taking: Reported on 08/29/2019) 60 tablet 3  . DULoxetine (CYMBALTA) 20 MG capsule Take 1 capsule (20 mg total) by mouth daily. (Patient not taking: Reported on 08/29/2019) 30 capsule 0  . Lasmiditan Succinate (REYVOW) 100 MG TABS Take 100 mg by mouth as needed. Take 1 tablet for headache. No more than 1 tablet in 24 hours (Patient not taking: Reported on 08/29/2019) 8 tablet 3   No current facility-administered medications on file prior to visit.   Review of Systems All otherwise neg per pt     Objective:   Physical Exam BP 118/72   Pulse 74   Temp (!) 97.2 F (36.2 C)   Ht 5\' 1"  (1.549 m)   Wt 152 lb (68.9 kg)   SpO2 99%   BMI 28.72 kg/m  VS noted,  Constitutional: Pt appears in NAD HENT: Head: NCAT.  Right Ear: External ear normal.  Left Ear: External ear normal.  Eyes: . Pupils are equal, round, and reactive to light. Conjunctivae and EOM are normal Nose: without d/c or deformity Neck: Neck supple. Gross normal ROM Cardiovascular: Normal rate and regular rhythm.   Pulmonary/Chest: Effort normal and breath sounds without rales or wheezing.  Abd:  Soft, NT, ND, + BS, no organomegaly Neurological: Pt is alert. At baseline orientation, motor grossly intact Skin: Skin is warm. No rashes, other new lesions, no LE edema Psychiatric: Pt behavior is normal without agitation  All otherwise neg per pt  Lab Results  Component Value Date   WBC 7.9 11/14/2018   HGB 12.7 11/14/2018   HCT 37.8 11/14/2018   PLT 398.0 11/14/2018   GLUCOSE 247 (H) 11/14/2018   CHOL 274 (H) 08/23/2018   TRIG 322.0 (H) 08/23/2018   HDL 55.20 08/23/2018   LDLDIRECT 191.0 08/23/2018    LDLCALC 194 (H) 11/23/2017   ALT 11 11/14/2018   AST 11 11/14/2018   NA 132 (L) 11/14/2018   K 3.5 11/14/2018   CL 97 11/14/2018   CREATININE 0.67 11/14/2018   BUN 7 11/14/2018   CO2 28 11/14/2018   TSH 0.84 11/14/2018   INR 1.08 07/23/2009   HGBA1C 11.1 (A) 07/02/2019   MICROALBUR <0.7 06/25/2016       Assessment & Plan:

## 2019-08-29 NOTE — Assessment & Plan Note (Signed)

## 2019-08-29 NOTE — Progress Notes (Addendum)
Rachel [name] [place]: [name] Case ID: [id number] - Rx #[id number] Need help? Call us at [phone] Outcome Approvedtoday The request has been approved. The authorization is effective for a maximum of 6 fills from 08/30/2019 to 02/29/2020, as long as the member is enrolled in their current health plan. The request was approved as submitted. This request is approved for #1.32mL per 30 days. Renewal requires that you have experienced a reduction in migraine or headache frequency of at least 2 days per month, OR that you have experienced a reduction in migraine severity or migraine duration with Ajovy therapy. A written notification letter will follow with additional details. Drug Ajovy 225MG /1.5ML auto-injectors Form [employer] ePA Form 2017 NCPDP Original Claim Info 75

## 2019-08-29 NOTE — Patient Instructions (Signed)

## 2019-08-31 ENCOUNTER — Other Ambulatory Visit: Payer: Self-pay | Admitting: Internal Medicine

## 2019-08-31 DIAGNOSIS — E119 Type 2 diabetes mellitus without complications: Secondary | ICD-10-CM

## 2019-08-31 MED ORDER — CIPROFLOXACIN HCL 500 MG PO TABS: 500.0000 mg | ORAL_TABLET | Freq: Two times a day (BID) | ORAL | 0 refills | Status: AC

## 2019-09-05 ENCOUNTER — Ambulatory Visit (INDEPENDENT_AMBULATORY_CARE_PROVIDER_SITE_OTHER): Payer: 59 | Admitting: Family Medicine

## 2019-09-05 ENCOUNTER — Ambulatory Visit (INDEPENDENT_AMBULATORY_CARE_PROVIDER_SITE_OTHER): Payer: 59 | Admitting: Internal Medicine

## 2019-09-05 ENCOUNTER — Telehealth: Payer: Self-pay

## 2019-09-05 ENCOUNTER — Encounter: Payer: Self-pay | Admitting: Internal Medicine

## 2019-09-05 ENCOUNTER — Other Ambulatory Visit: Payer: Self-pay

## 2019-09-05 ENCOUNTER — Encounter: Payer: Self-pay | Admitting: Family Medicine

## 2019-09-05 VITALS — BP 110/80 | HR 92 | Ht 61.0 in | Wt 151.0 lb

## 2019-09-05 VITALS — BP 94/64 | HR 99 | Ht 61.0 in | Wt 142.0 lb

## 2019-09-05 DIAGNOSIS — M999 Biomechanical lesion, unspecified: Secondary | ICD-10-CM | POA: Diagnosis not present

## 2019-09-05 DIAGNOSIS — E782 Mixed hyperlipidemia: Secondary | ICD-10-CM | POA: Diagnosis not present

## 2019-09-05 DIAGNOSIS — Z794 Long term (current) use of insulin: Secondary | ICD-10-CM

## 2019-09-05 DIAGNOSIS — R519 Headache, unspecified: Secondary | ICD-10-CM

## 2019-09-05 DIAGNOSIS — E1165 Type 2 diabetes mellitus with hyperglycemia: Secondary | ICD-10-CM

## 2019-09-05 DIAGNOSIS — G4486 Cervicogenic headache: Secondary | ICD-10-CM

## 2019-09-05 DIAGNOSIS — E663 Overweight: Secondary | ICD-10-CM

## 2019-09-05 MED ORDER — INSULIN LISPRO (1 UNIT DIAL) 100 UNIT/ML (KWIKPEN): 5.0000 [IU] | PEN_INJECTOR | Freq: Three times a day (TID) | SUBCUTANEOUS | 11 refills | Status: DC

## 2019-09-05 MED ORDER — LANTUS SOLOSTAR 100 UNIT/ML ~~LOC~~ SOPN: PEN_INJECTOR | SUBCUTANEOUS | 5 refills | Status: DC

## 2019-09-05 MED ORDER — PENNSAID 2 % EX SOLN: 2.0000 g | Freq: Two times a day (BID) | CUTANEOUS | 3 refills | Status: AC

## 2019-09-05 NOTE — Patient Instructions (Addendum)
Good to see you Continue to monitor the knees Pennsaid sent in  Ice regularaly Stay off Cymbalta  See me again in 5-6 weeks

## 2019-09-05 NOTE — Telephone Encounter (Signed)
New message    The patient calling for test results.   

## 2019-09-05 NOTE — Progress Notes (Signed)
[facility] [address] [place] Phone: [phone] Subjective:   I Rachel [name] am serving as a Education administrator for Dr. Hulan Saas.  This visit occurred during the SARS-CoV-2 public health emergency.  Safety protocols were in place, including screening questions prior to the visit, additional usage of staff PPE, and extensive cleaning of exam room while observing appropriate contact time as indicated for disinfecting solutions.   I'm seeing this patient by the request  of:  Rachel Borg, MD  CC: Neck pain and headache follow-up  [id number]  Rachel [name] is a 54 y.o. female coming in with complaint of headaches and neck pain. Last seen on 08/08/2019 for OMT. Patient states her neck is painful. Bilateral knee pain. Patient states she feels like everything hurts.  Patient was unable to tolerate the Cymbalta.     Past Medical History:  Diagnosis Date  . ALLERGIC RHINITIS 10/04/2007  . Anemia 01/21/2011  . ASTHMA 08/02/2007   INHALERS ONLY IN [place]  . Blood transfusion without reported diagnosis    with first child   . COMMON MIGRAINE 05/15/2009  . DIABETES MELLITUS, TYPE II 08/02/2007  . GERD 08/02/2007  . HYPERLIPIDEMIA 08/02/2007  . INSOMNIA-SLEEP DISORDER-UNSPEC 01/04/2008  . INTERMITTENT VERTIGO 05/15/2009  . LIBIDO, DECREASED 01/09/2010   Past Surgical History:  Procedure Laterality Date  . CESAREAN SECTION     x 3  . CYST EXCISION     left knee  . PAROTIDECTOMY  10/21/2011   Procedure: PAROTIDECTOMY;  Surgeon: Melida Quitter, MD;  Location: [facility] OR;  Service: ENT;  Laterality: Left;   Social History   Socioeconomic History  . Marital status: Married    Spouse name: Rachel [name]  . Number of children: 3  . Years of education: Degree  . Highest education level: Not on file  Occupational History  . Occupation: Research scientist (life sciences): [employer]  . Occupation: Chartered certified accountant    Employer: [employer]  Tobacco Use  . Smoking  status: Never Smoker  . Smokeless tobacco: Never Used  Substance and Sexual Activity  . Alcohol use: No    Alcohol/week: 0.0 standard drinks  . Drug use: No  . Sexual activity: Yes  Other Topics Concern  . Not on file  Social History Narrative   She has 3 daughters.   Patient has a 4 year degree.    Patient working at [employer].    Patient is married to [place].   Social Determinants of Health   Financial Resource Strain:   . Difficulty of Paying Living Expenses:   Food Insecurity:   . Worried About Charity fundraiser in the Last Year:   . Arboriculturist in the Last Year:   Transportation Needs:   . Film/video editor (Medical):   Marland Kitchen Lack of Transportation (Non-Medical):   Physical Activity:   . Days of Exercise per Week:   . Minutes of Exercise per Session:   Stress:   . Feeling of Stress :   Social Connections:   . Frequency of Communication with Friends and Family:   . Frequency of Social Gatherings with Friends and Family:   . Attends Religious Services:   . Active Member of Clubs or Organizations:   . Attends Archivist Meetings:   Marland Kitchen Marital Status:    Allergies  Allergen Reactions  . Lipitor [Atorvastatin]     REACTION: myalgias  . Nitroglycerin  Family History  Problem Relation Age of Onset  . Hypertension Father   . Diabetes Father   . Asthma Father   . Cervical cancer Maternal Aunt   . Heart disease Maternal Aunt   . Anesthesia problems Neg Hx   . Colon cancer Neg Hx     Current Outpatient Medications (Endocrine & Metabolic):  Marland Kitchen  Insulin Glargine (LANTUS SOLOSTAR) 100 UNIT/ML Solostar Pen, INJECT 40 UNITS INTO THE SKIN IN THE MORNING. .  insulin lispro (HUMALOG KWIKPEN) 100 UNIT/ML KwikPen, Inject 0.08-0.12 mLs (8-12 Units total) into the skin 3 (three) times daily. .  Semaglutide,0.25 or 0.5MG /DOS, (OZEMPIC, 0.25 OR 0.5 MG/DOSE,) 2 MG/1.5ML SOPN, Inject 0.5 mg into the skin once a week.  Current Outpatient Medications  (Cardiovascular):  .  atenolol (TENORMIN) 25 MG tablet, Take 1 tablet (25 mg total) by mouth daily. X 7 days, then increase to 2 tabs (50mg ) daily .  rosuvastatin (CRESTOR) 40 MG tablet, TAKE 1 TABLET (40 MG TOTAL) BY MOUTH DAILY.   Current Outpatient Medications (Analgesics):  .  aspirin 81 MG EC tablet, Take 81 mg by mouth daily.   .  butalbital-acetaminophen-caffeine (FIORICET, ESGIC) 50-325-40 MG tablet, TAKE 1 TABLET BY MOUTH EVERY 6 HOURS AS NEEDED FOR HEADACHE (MAX OF 1 OR 2 TABLETS PER DAY) .  Fremanezumab-vfrm (AJOVY) 225 MG/1.5ML SOAJ, Inject 225 mg into the skin every 30 (thirty) days. [place] Succinate (REYVOW) 100 MG TABS, Take 100 mg by mouth as needed. Take 1 tablet for headache. No more than 1 tablet in 24 hours .  traMADol (ULTRAM) 50 MG tablet, TAKE 1 TABLET BY MOUTH EVERY 8 HOURS AS NEEDED   Current Outpatient Medications (Other):  .  Blood Glucose Monitoring Suppl (FREESTYLE LITE) DEVI, Use as directed four times daily E11.9 .  ciprofloxacin (CIPRO) 500 MG tablet, Take 1 tablet (500 mg total) by mouth 2 (two) times daily for 10 days. .  cyclobenzaprine (FLEXERIL) 5 MG tablet, Take 1 tablet (5 mg total) by mouth 3 (three) times daily as needed for muscle spasms. .  DENTA 5000 PLUS 1.1 % CREA dental cream,  .  DULoxetine (CYMBALTA) 20 MG capsule, Take 1 capsule (20 mg total) by mouth daily. Marland Kitchen  esomeprazole (NEXIUM) 40 MG capsule, TAKE 1 CAPSULE BY MOUTH 2 TIMES DAILY BEFORE A MEAL. Marland Kitchen  gabapentin (NEURONTIN) 300 MG capsule, TAKE 1 CAPSULE BY MOUTH 2 TIMES DAILY. Marland Kitchen  glucose blood (FREESTYLE LITE) test strip, Use as instructed four times daily E11.9 .  Insulin Pen Needle (UNIFINE PENTIPS) 32G X 4 MM MISC, USE 4x a day .  Lancets (FREESTYLE) lancets, Use as instructed four times daily E11.9 .  linaclotide (LINZESS) 145 MCG CAPS capsule, Take 1 capsule (145 mcg total) by mouth daily before breakfast. .  metoCLOPramide (REGLAN) 5 MG tablet, Take 1 tablet (5 mg total) by  mouth every 8 (eight) hours as needed for nausea or vomiting. .  Diclofenac Sodium (PENNSAID) 2 % SOLN, Place 2 g onto the skin 2 (two) times daily.   Reviewed prior external information including notes and imaging from  primary care provider As well as notes that were available from care everywhere and other healthcare systems.  Past medical history, social, surgical and family history all reviewed in electronic medical record.  No pertanent information unless stated regarding to the chief complaint.   Review of Systems:  No  visual changes, nausea, vomiting, diarrhea, constipation, dizziness, abdominal pain, skin rash, fevers, chills, night sweats, weight loss,  swollen lymph nodes, joint swelling, chest pain, shortness of breath, mood changes. POSITIVE muscle aches, body aches, headaches  Objective  Blood pressure 94/64, pulse 99, height 5\' 1"  (1.549 m), weight 142 lb (64.4 kg), SpO2 97 %.   General: No apparent distress alert and oriented x3 mood and affect normal, dressed appropriately.  HEENT: Pupils equal, extraocular movements intact  Respiratory: Patient's speak in full sentences and does not appear short of breath  Cardiovascular: No lower extremity edema, non tender, no erythema  Neuro: Cranial nerves II through XII are intact, neurovascularly intact in all extremities with 2+ DTRs and 2+ pulses.  Gait normal with good balance and coordination.  MSK:  Non tender with full range of motion and good stability and symmetric strength and tone of shoulders, elbows, wrist, hip, knee and ankles bilaterally.  Neck exam does have some loss of lordosis.  Tender to palpation in the paraspinal musculature all the way from the cervical down to the lumbar area today.  Mild worsening than previous exam.  Negative Spurling's today.  5-5 strength of the upper extremities.  Low back exam does have some tightness noted as well.  Tender to palpation with tightness in the [place].  Negative straight leg  test.  5-5 strength of the lower extremities  Osteopathic findings C2 flexed rotated and side bent right C6 flexed rotated and side bent left T3 extended rotated and side bent right inhaled third rib T9 extended rotated and side bent left L2 flexed rotated and side bent right  L4 flexed rotated and side bent left Sacrum right on right    Impression and Recommendations:     This case required medical decision making of moderate complexity. The above documentation has been reviewed and is accurate and complete Lyndal Pulley, DO       Note: This dictation was prepared with Dragon dictation along with smaller phrase technology. Any transcriptional errors that result from this process are unintentional.

## 2019-09-05 NOTE — Assessment & Plan Note (Signed)
Chronic problem : Mild exacerbation  interventions previously, including medication management: Tried multiple medications including Cymbalta the patient did have a side effect.   Interventions this visit: Continued Flexeril, started topical anti-inflammatories, discontinued Cymbalta, starting manipulation again We discussed with patient the importance ergonomics, home exercises, icing regimen, and over-the-counter natural products.      Return to clinic: 6 weeks

## 2019-09-05 NOTE — Assessment & Plan Note (Signed)

## 2019-09-05 NOTE — Progress Notes (Signed)
Subjective:     Patient ID: Rachel [name], female   DOB: 07-28-1965, 54 y.o.   MRN: [id number]  This visit occurred during the SARS-CoV-2 public health emergency.  Safety protocols were in place, including screening questions prior to the visit, additional usage of staff PPE, and extensive cleaning of exam room while observing appropriate contact time as indicated for disinfecting solutions.   HPI Rachel [name] is a  54 y.o. woman, returning for f/u for DM2, dx 1987, uncontrolled, insulin-dependent, with probable complications (? peripheral neuropathy).  Last visit 2 months ago.  Reviewed HbA1c levels:: Lab Results  Component Value Date   HGBA1C 10.0 (H) 08/29/2019   HGBA1C 11.1 (A) 07/02/2019   HGBA1C 10.2 (H) 11/14/2018  02/17/2017: HbA1c calculated from fructosamine is better, at 8.34%, but still high. Prev. 10.1%.  Reviewed history: She came off all DM medicines for 6 mo before the HbA1c in 09/2013. She again ran out of all meds in 04/2016.  She is  on: - Lantus 36 units in am >> night >> 40 units in a.m. - Humalog 3-7 >> 8-12 >> 3-7 units before meals - Ozempic 0.5 mg weekly - added 06/2019 Stopped Januvia 100 mg in am >> then Ozempic 0.5 mg weekly >> ...cannot remember if she actually started this... She is off Metformin XR 1000 mg 2x a day with b'fast and dinner >> upset stomach >> stopped [id number] We triedTrulicity 1.5 mg weekly >> worked great but had to stop b/c GERD >> stopped. We tried Jardiance 25 mg daily >> started 08/2016 >> but stopped recently 2/2 recurrent UTIs She was on Glipizide 5 mg bid - added 04/2015 >> was not taking it b/c low CBGs. We stopped Glipizide XL 5 mg in 07/2014. She had episodes of hypoglycemia with Amaryl in the past. She was on Bydureon 2 mg weekly (had nausea).  She checks her sugars 1-2 times a day: - am:   102-110, 260 >> 130s >> 134-200 >> 180-300 >> 138-140 - after b'fast: 158-251 >> n/c - before lunch:  n/c >> 137 >> n/c >> 180-200 >> n/c  >> 60 x1 - after lunch: up to 200s - before dinner:  191, 308 >> n/c >> 180-200 >> 137, 150 - after dinner: 166-245 >> n/c - bedtime: 140-150 >> 140-150s >> n/c >> 300 >> 100-125 (has snack) She has hypoglycemia awareness in the 90s. Lowest: 38 (while on Trulicity) >> [id number] when she moved basal insulin at bedtime >> 170 >> 60 x1 - skipped lunch. Highest 600 >> ... >> 450 >> 150.  She has seen nutrition in the past.  -+ HL. Last lipids: Lab Results  Component Value Date   CHOL 189 08/29/2019   HDL 64.10 08/29/2019   LDLCALC 108 (H) 08/29/2019   LDLDIRECT 191.0 08/23/2018   TRIG 88.0 08/29/2019   CHOLHDL 3 08/29/2019  On Crestor 40. 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  Review of Systems Constitutional: no weight gain/no weight loss, no fatigue, no subjective hyperthermia, no subjective hypothermia Eyes: no blurry vision, no xerophthalmia ENT: no sore throat, no nodules palpated in neck, no dysphagia, no odynophagia, no hoarseness Cardiovascular: no CP/no SOB/no palpitations/no leg swelling Respiratory: no cough/no SOB/no wheezing Gastrointestinal:  no N/no V/no D/no C/no acid reflux Musculoskeletal: no muscle aches/no joint aches Skin: no rashes, no hair loss Neurological: no tremors/no numbness/no tingling/no dizziness  I reviewed pt's medications, allergies, PMH, social hx, family hx, and changes were documented in the history of present illness. Otherwise, unchanged from my initial visit note.  Past Medical History:  Diagnosis Date  . ALLERGIC RHINITIS 10/04/2007  . Anemia 01/21/2011  . ASTHMA 08/02/2007   INHALERS ONLY IN [place]  . Blood transfusion without reported diagnosis    with first child   . COMMON MIGRAINE 05/15/2009  . DIABETES MELLITUS,  TYPE II 08/02/2007  . GERD 08/02/2007  . HYPERLIPIDEMIA 08/02/2007  . INSOMNIA-SLEEP DISORDER-UNSPEC 01/04/2008  . INTERMITTENT VERTIGO 05/15/2009  . LIBIDO, DECREASED 01/09/2010   Past Surgical History:  Procedure Laterality Date  . CESAREAN SECTION     x 3  . CYST EXCISION     left knee  . PAROTIDECTOMY  10/21/2011   Procedure: PAROTIDECTOMY;  Surgeon: Melida Quitter, MD;  Location: [facility] OR;  Service: ENT;  Laterality: Left;   Social History   Socioeconomic History  . Marital status: Married    Spouse name: Rachel [name]  . Number of children: 3  . Years of education: Degree  . Highest education level: Not on file  Occupational History  . Occupation: Research scientist (life sciences): [employer]  . Occupation: Chartered certified accountant    Employer: [employer]  Tobacco Use  . Smoking status: Never Smoker  . Smokeless tobacco: Never Used  Substance and Sexual Activity  . Alcohol use: No    Alcohol/week: 0.0 standard drinks  . Drug use: No  . Sexual activity: Yes  Other Topics Concern  . Not on file  Social History Narrative   She has 3 daughters.   Patient has a 4 year degree.    Patient working at [employer].    Patient is married to [place].   Social Determinants of Health   Financial Resource Strain:   . Difficulty of Paying Living Expenses:   Food Insecurity:   . Worried About Charity fundraiser in the Last Year:   . Arboriculturist in the Last Year:   Transportation Needs:   . Film/video editor (Medical):   Marland Kitchen Lack of Transportation (Non-Medical):   Physical Activity:   . Days of Exercise per Week:   . Minutes of Exercise per Session:   Stress:   . Feeling of Stress :   Social Connections:   . Frequency of Communication with Friends and Family:   . Frequency of Social Gatherings with Friends and Family:   . Attends Religious Services:   . Active Member of Clubs or Organizations:   . Attends Archivist Meetings:   Marland Kitchen Marital Status:   Intimate Partner Violence:   .  Fear of Current or Ex-Partner:   . Emotionally Abused:   Marland Kitchen Physically Abused:   . Sexually Abused:    Current Outpatient Medications on File Prior to Visit  Medication Sig Dispense Refill  . aspirin 81 MG EC tablet Take 81 mg by mouth daily.      Marland Kitchen atenolol (TENORMIN) 25 MG tablet Take 1 tablet (25 mg total) by mouth daily. X 7 days, then increase to 2 tabs (50mg ) daily 60 tablet 3  . Blood Glucose Monitoring Suppl (FREESTYLE LITE) DEVI Use as directed four times daily E11.9 1 each 0  . butalbital-acetaminophen-caffeine (FIORICET, ESGIC) 50-325-40 MG tablet TAKE  1 TABLET BY MOUTH EVERY 6 HOURS AS NEEDED FOR HEADACHE (MAX OF 1 OR 2 TABLETS PER DAY) 30 tablet 5  . ciprofloxacin (CIPRO) 500 MG tablet Take 1 tablet (500 mg total) by mouth 2 (two) times daily for 10 days. 20 tablet 0  . cyclobenzaprine (FLEXERIL) 5 MG tablet Take 1 tablet (5 mg total) by mouth 3 (three) times daily as needed for muscle spasms. 90 tablet 2  . DENTA 5000 PLUS 1.1 % CREA dental cream   3  . esomeprazole (NEXIUM) 40 MG capsule TAKE 1 CAPSULE BY MOUTH 2 TIMES DAILY BEFORE A MEAL. 180 capsule 3  . Fremanezumab-vfrm (AJOVY) 225 MG/1.5ML SOAJ Inject 225 mg into the skin every 30 (thirty) days. 1 pen 11  . gabapentin (NEURONTIN) 300 MG capsule TAKE 1 CAPSULE BY MOUTH 2 TIMES DAILY. 60 capsule 5  . glucose blood (FREESTYLE LITE) test strip Use as instructed four times daily E11.9 400 each 12  . Insulin Glargine (LANTUS SOLOSTAR) 100 UNIT/ML Solostar Pen INJECT 40 UNITS INTO THE SKIN IN THE MORNING. 33 mL 1  . insulin lispro (HUMALOG KWIKPEN) 100 UNIT/ML KwikPen Inject 0.08-0.12 mLs (8-12 Units total) into the skin 3 (three) times daily. 15 mL 11  . Insulin Pen Needle (UNIFINE PENTIPS) 32G X 4 MM MISC USE 4x a day 200 each PRN  . Lancets (FREESTYLE) lancets Use as instructed four times daily E11.9 400 each 12  . Lasmiditan Succinate (REYVOW) 100 MG TABS Take 100 mg by mouth as needed. Take 1 tablet for headache. No more than 1  tablet in 24 hours 8 tablet 3  . linaclotide (LINZESS) 145 MCG CAPS capsule Take 1 capsule (145 mcg total) by mouth daily before breakfast. 30 capsule 2  . metoCLOPramide (REGLAN) 5 MG tablet Take 1 tablet (5 mg total) by mouth every 8 (eight) hours as needed for nausea or vomiting. 40 tablet 1  . rosuvastatin (CRESTOR) 40 MG tablet TAKE 1 TABLET (40 MG TOTAL) BY MOUTH DAILY. 90 tablet 3  . Semaglutide,0.25 or 0.5MG /DOS, (OZEMPIC, 0.25 OR 0.5 MG/DOSE,) 2 MG/1.5ML SOPN Inject 0.5 mg into the skin once a week. 2 pen 5  . traMADol (ULTRAM) 50 MG tablet TAKE 1 TABLET BY MOUTH EVERY 8 HOURS AS NEEDED 60 tablet 1   No current facility-administered medications on file prior to visit.   Allergies  Allergen Reactions  . Lipitor [Atorvastatin]     REACTION: myalgias  . Nitroglycerin    Family History  Problem Relation Age of Onset  . Hypertension Father   . Diabetes Father   . Asthma Father   . Cervical cancer Maternal Aunt   . Heart disease Maternal Aunt   . Anesthesia problems Neg Hx   . Colon cancer Neg Hx     Objective:   Physical Exam BP 110/80   Pulse 92   Ht 5\' 1"  (1.549 m)   Wt 151 lb (68.5 kg)   SpO2 99%   BMI 28.53 kg/m  Body mass index is 28.53 kg/m. Wt Readings from Last 3 Encounters:  09/05/19 151 lb (68.5 kg)  09/05/19 142 lb (64.4 kg)  08/29/19 152 lb (68.9 kg)   Constitutional: Slightly overweight, in NAD Eyes: PERRLA, EOMI, no exophthalmos ENT: moist mucous membranes, no thyromegaly, no cervical lymphadenopathy Cardiovascular: RRR, No MRG Respiratory: CTA B Gastrointestinal: abdomen soft, NT, ND, BS+ Musculoskeletal: no deformities, strength intact in all 4 Skin: moist, warm, no rashes Neurological: no tremor with outstretched hands, DTR normal in all  4  Assessment:     1. DM2, uncontrolled, insulin-dependent, with probable complications - ? peripheral neuropathy  2. HL  3.  Overweight  Plan:     1. Patient with uncontrolled type 2 diabetes,  basal-bolus insulin regimen and weekly GLP-1 receptor agonist added at last visit her sugars were very high before last visit, with an HbA1c of 12.7%.  At that time, I suggested to change from Januvia to [facility] as she had severe GERD with Trulicity in the past.  She also had nausea with Bydureon.  We also tried SGLT 2 inhibitor and sulfonylurea in the past but she could not tolerate them.  However, at last visit, she did not remember if she actually took [place] but she was not on it at that time.  She was also on Metformin due to GI intolerance.  Her HbA1c was still very high, at 11.1%.  At that time, I advised him to start Ozempic and we increase her Humalog.  Since then, she had another HbA1c that was slightly better few days ago, at 10%. -At last visit I advised her to check sugars 4 times a day and bring in her log so I can send the prescription for this to her pharmacy. -At this visit, her sugars are much better.  She does not bring the log meter, but per her recall, they are much closer to goal.  At night, after dinner, her sugars are lower but still excellent.  However, she feels the need to eat a snack at that time as she is afraid that she may drop her sugars.  Also, she had 1 low blood sugar in the 60s midday 1 day when she skipped breakfast and still took Lantus in the morning.  Therefore, we will decrease the dose of Lantus from 40 units to 34 units.   -Because of the previous low blood sugars, she is taking lower doses of Humalog, usually 3 to 5 units in the morning occasionally 7 units when she eats starches.  Now with the reduced dose of Lantus, this will allow her to use higher dose of Humalog if needed. -She tolerates Ozempic well but this is expensive.  Given coupon card.  We will continue the same dose for now. - I advised her to: Patient Instructions  Please decrease: - Lantus 34 units in a.m.  Please increase: - Humalog 5-8 units 15 min before meals  Please continue: - Ozempic  0.5 mg weekly in a.m.  Please return in 3 months with your sugar log.   - advised to check sugars at different times of the day - 4x a day, rotating check times - advised for yearly eye exams >> she is UTD - return to clinic in 3 months  2. HL - Reviewed latest lipid panel from 08/2018: LDL extremely high, triglycerides high Lab Results  Component Value Date   CHOL 189 08/29/2019   HDL 64.10 08/29/2019   LDLCALC 108 (H) 08/29/2019   LDLDIRECT 191.0 08/23/2018   TRIG 88.0 08/29/2019   CHOLHDL 3 08/29/2019  - Continues Crestor 40 but she tells me she does not take it consistently >> will look at home to see if she has any left  3.  Overweight -We will continue Ozempic which should also help with weight loss -She lost 3 pounds since last visit  Philemon Kingdom, MD PhD [facility] Endocrinology

## 2019-09-05 NOTE — Patient Instructions (Addendum)
Please decrease: - Lantus 34 units in a.m.  Please increase: - Humalog 5-8 units 15 min before meals  Please continue: - Ozempic 0.5 mg weekly in a.m.  Please return in 3 months with your sugar log.

## 2019-09-06 NOTE — Telephone Encounter (Signed)
Spoke with patient and info given. She saw Endo on yesterday and picked up abx as well.

## 2019-09-06 NOTE — Telephone Encounter (Signed)
(  Dr. Gwynn Burly recommendations)  Left message on MyChart, pt to cont same tx except  The test results show that your current treatment is OK, as the tests are stable, except the urine test appears to be consistent with infection, and the A1c remains severely high  We need to: 1) antibiotic for the urine 2) Refer to endocrinology for better sugar control   Staff to please inform pt, I will do rx and referral

## 2019-10-03 NOTE — Progress Notes (Signed)
[facility] [place] Phone: [phone] Subjective:   [name], am serving as a scribe for Dr. Hulan Saas. This visit occurred during the SARS-CoV-2 public health emergency.  Safety protocols were in place, including screening questions prior to the visit, additional usage of staff PPE, and extensive cleaning of exam room while observing appropriate contact time as indicated for disinfecting solutions.   I'm seeing this patient by the request  of:  Biagio Borg, MD  CC: Neck pain follow-up  [id number]  Rachel [name] is a 54 y.o. female coming in with complaint of back pain. Last seen on 09/05/2019 for OMT. Patient states that she has been having an increase in neck pain and bilateral knee pain. Pain towards end of day and in the morning.  Patient states that the neck pain is severe and is not getting better.  MRI has shown some degenerative disc disease with an osteophyte complex at the C6 region.  Increasing radicular symptoms are noted down the right arm.  Patient denies though any weakness.     Past Medical History:  Diagnosis Date  . ALLERGIC RHINITIS 10/04/2007  . Anemia 01/21/2011  . ASTHMA 08/02/2007   INHALERS ONLY IN [place]  . Blood transfusion without reported diagnosis    with first child   . COMMON MIGRAINE 05/15/2009  . DIABETES MELLITUS, TYPE II 08/02/2007  . GERD 08/02/2007  . HYPERLIPIDEMIA 08/02/2007  . INSOMNIA-SLEEP DISORDER-UNSPEC 01/04/2008  . INTERMITTENT VERTIGO 05/15/2009  . LIBIDO, DECREASED 01/09/2010   Past Surgical History:  Procedure Laterality Date  . CESAREAN SECTION     x 3  . CYST EXCISION     left knee  . PAROTIDECTOMY  10/21/2011   Procedure: PAROTIDECTOMY;  Surgeon: Melida Quitter, MD;  Location: [facility] OR;  Service: ENT;  Laterality: Left;   Social History   Socioeconomic History  . Marital status: Married    Spouse name: [name]  . Number of children: 3  . Years of  education: Degree  . Highest education level: Not on file  Occupational History  . Occupation: Research scientist (life sciences): [employer]  . Occupation: Chartered certified accountant    Employer: [employer]  Tobacco Use  . Smoking status: Never Smoker  . Smokeless tobacco: Never Used  Substance and Sexual Activity  . Alcohol use: No    Alcohol/week: 0.0 standard drinks  . Drug use: No  . Sexual activity: Yes  Other Topics Concern  . Not on file  Social History Narrative   She has 3 daughters.   Patient has a 4 year degree.    Patient working at [employer].    Patient is married to Olivia.   Social Determinants of Health   Financial Resource Strain:   . Difficulty of Paying Living Expenses:   Food Insecurity:   . Worried About Charity fundraiser in the Last Year:   . Arboriculturist in the Last Year:   Transportation Needs:   . Film/video editor (Medical):   Marland Kitchen Lack of Transportation (Non-Medical):   Physical Activity:   . Days of Exercise per Week:   . Minutes of Exercise per Session:   Stress:   . Feeling of Stress :   Social Connections:   . Frequency of Communication with Friends and Family:   . Frequency of Social Gatherings with Friends and Family:   . Attends Religious Services:   .  Active Member of Clubs or Organizations:   . Attends Archivist Meetings:   Marland Kitchen Marital Status:    Allergies  Allergen Reactions  . Lipitor [Atorvastatin]     REACTION: myalgias  . Nitroglycerin    Family History  Problem Relation Age of Onset  . Hypertension Father   . Diabetes Father   . Asthma Father   . Cervical cancer Maternal Aunt   . Heart disease Maternal Aunt   . Anesthesia problems Neg Hx   . Colon cancer Neg Hx     Current Outpatient Medications (Endocrine & Metabolic):  .  insulin glargine (LANTUS SOLOSTAR) 100 UNIT/ML Solostar Pen, INJECT 34 UNITS INTO THE SKIN IN THE MORNING. .  insulin lispro (HUMALOG KWIKPEN) 100 UNIT/ML KwikPen, Inject 0.05-0.08 mLs  (5-8 Units total) into the skin 3 (three) times daily. .  Semaglutide,0.25 or 0.5MG /DOS, (OZEMPIC, 0.25 OR 0.5 MG/DOSE,) 2 MG/1.5ML SOPN, Inject 0.5 mg into the skin once a week.  Current Outpatient Medications (Cardiovascular):  .  atenolol (TENORMIN) 25 MG tablet, Take 1 tablet (25 mg total) by mouth daily. X 7 days, then increase to 2 tabs (50mg ) daily .  rosuvastatin (CRESTOR) 40 MG tablet, TAKE 1 TABLET (40 MG TOTAL) BY MOUTH DAILY.  Current Outpatient Medications (Respiratory):  .  fexofenadine (ALLEGRA) 180 MG tablet, Take 1 tablet (180 mg total) by mouth daily. Marland Kitchen  triamcinolone (NASACORT) 55 MCG/ACT AERO nasal inhaler, Place 2 sprays into the nose daily.  Current Outpatient Medications (Analgesics):  .  aspirin 81 MG EC tablet, Take 81 mg by mouth daily.   .  butalbital-acetaminophen-caffeine (FIORICET, ESGIC) 50-325-40 MG tablet, TAKE 1 TABLET BY MOUTH EVERY 6 HOURS AS NEEDED FOR HEADACHE (MAX OF 1 OR 2 TABLETS PER DAY) .  Fremanezumab-vfrm (AJOVY) 225 MG/1.5ML SOAJ, Inject 225 mg into the skin every 30 (thirty) days. [place] Succinate (REYVOW) 100 MG TABS, Take 100 mg by mouth as needed. Take 1 tablet for headache. No more than 1 tablet in 24 hours .  traMADol (ULTRAM) 50 MG tablet, TAKE 1 TABLET BY MOUTH EVERY 8 HOURS AS NEEDED   Current Outpatient Medications (Other):  .  Blood Glucose Monitoring Suppl (FREESTYLE LITE) DEVI, Use as directed four times daily E11.9 .  cyclobenzaprine (FLEXERIL) 5 MG tablet, Take 1 tablet (5 mg total) by mouth 3 (three) times daily as needed for muscle spasms. .  DENTA 5000 PLUS 1.1 % CREA dental cream,  .  Diclofenac Sodium (PENNSAID) 2 % SOLN, Place 2 g onto the skin 2 (two) times daily. Marland Kitchen  esomeprazole (NEXIUM) 40 MG capsule, TAKE 1 CAPSULE BY MOUTH 2 TIMES DAILY BEFORE A MEAL. Marland Kitchen  gabapentin (NEURONTIN) 300 MG capsule, TAKE 1 CAPSULE BY MOUTH 2 TIMES DAILY. Marland Kitchen  glucose blood (FREESTYLE LITE) test strip, Use as instructed four times daily  E11.9 .  Insulin Pen Needle (UNIFINE PENTIPS) 32G X 4 MM MISC, USE 4x a day .  Lancets (FREESTYLE) lancets, Use as instructed four times daily E11.9 .  linaclotide (LINZESS) 145 MCG CAPS capsule, Take 1 capsule (145 mcg total) by mouth daily before breakfast. .  metoCLOPramide (REGLAN) 5 MG tablet, Take 1 tablet (5 mg total) by mouth every 8 (eight) hours as needed for nausea or vomiting.   Reviewed prior external information including notes and imaging from  primary care provider As well as notes that were available from care everywhere and other healthcare systems.  Past medical history, social, surgical and family history all  reviewed in electronic medical record.  No pertanent information unless stated regarding to the chief complaint.   Review of Systems:  No headache, visual changes, nausea, vomiting, diarrhea, constipation, dizziness, abdominal pain, skin rash, fevers, chills, night sweats, weight loss, swollen lymph nodes, , chest pain, shortness of breath, mood changes. POSITIVE muscle aches, body aches, joint swelling  Objective  Blood pressure 110/78, pulse 96, height 5\' 1"  (1.549 m), weight 152 lb (68.9 kg), SpO2 98 %.   General: No apparent distress alert and oriented x3 mood and affect normal, dressed appropriately.  HEENT: Pupils equal, extraocular movements intact  Respiratory: Patient's speak in full sentences and does not appear short of breath  Cardiovascular: No lower extremity edema, non tender, no erythema  Neuro: Cranial nerves II through XII are intact, neurovascularly intact in all extremities with 2+ DTRs and 2+ pulses.  Gait mild antalgic MSK:   Neck exam does have loss of lordosis.  Tender to palpation in the paraspinal musculature.  Positive Spurling's in the C6 distribution on the right side.  Mild with C7 also noted.  Loss of sidebending and rotation to the right.  Knee exam bilaterally does have lateral tracking of the patella.  Patient does have a positive  patellar grind test.  Mild pain over the medial joint line as well.  After informed written and verbal consent, patient was seated on exam table. Right knee was prepped with alcohol swab and utilizing anterolateral approach, patient's right knee space was injected with 4:1  marcaine 0.5%: Kenalog 40mg /dL. Patient tolerated the procedure well without immediate complications.  After informed written and verbal consent, patient was seated on exam table. Left knee was prepped with alcohol swab and utilizing anterolateral approach, patient's left knee space was injected with 4:1  marcaine 0.5%: Kenalog 40mg /dL. Patient tolerated the procedure well without immediate complications.    Impression and Recommendations:     This case required medical decision making of moderate complexity. The above documentation has been reviewed and is accurate and complete Lyndal Pulley, DO       Note: This dictation was prepared with Dragon dictation along with smaller phrase technology. Any transcriptional errors that result from this process are unintentional.

## 2019-10-04 ENCOUNTER — Encounter: Payer: Self-pay | Admitting: Internal Medicine

## 2019-10-04 ENCOUNTER — Encounter: Payer: Self-pay | Admitting: Family Medicine

## 2019-10-04 ENCOUNTER — Other Ambulatory Visit: Payer: Self-pay

## 2019-10-04 ENCOUNTER — Ambulatory Visit (INDEPENDENT_AMBULATORY_CARE_PROVIDER_SITE_OTHER): Payer: 59 | Admitting: Family Medicine

## 2019-10-04 VITALS — BP 110/78 | HR 96 | Ht 61.0 in | Wt 152.0 lb

## 2019-10-04 DIAGNOSIS — M222X2 Patellofemoral disorders, left knee: Secondary | ICD-10-CM

## 2019-10-04 DIAGNOSIS — M222X1 Patellofemoral disorders, right knee: Secondary | ICD-10-CM

## 2019-10-04 DIAGNOSIS — M5412 Radiculopathy, cervical region: Secondary | ICD-10-CM | POA: Diagnosis not present

## 2019-10-04 MED ORDER — TRIAMCINOLONE ACETONIDE 55 MCG/ACT NA AERO: 2.0000 | INHALATION_SPRAY | Freq: Every day | NASAL | 12 refills | Status: DC

## 2019-10-04 MED ORDER — FEXOFENADINE HCL 180 MG PO TABS: 180.0000 mg | ORAL_TABLET | Freq: Every day | ORAL | 2 refills | Status: DC

## 2019-10-04 NOTE — Assessment & Plan Note (Signed)
Chronic problem with worsening pain.  Bilateral injections given today.  Tolerated the procedure well and hopefully will continue to improve.  Patient wants to avoid any surgical intervention for her knees with the idea that she may be having it for her neck in the near future.  Topical anti-inflammatories given.  Prescription drug management also discussed.

## 2019-10-04 NOTE — Patient Instructions (Addendum)
Injected both knees today [facility] will call you See me again in 5-6 weeks

## 2019-10-04 NOTE — Assessment & Plan Note (Signed)
Cervical radiculopathy.  Worsening pain in the C6 distribution that is corresponding with the osteophyte complex.  Mild benefit with 2 epidurals over the course of time but still having pain that is affecting quality of life.  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  At this point we will refer to neurosurgery for their evaluation and potential treatment.

## 2019-10-09 ENCOUNTER — Other Ambulatory Visit: Payer: Self-pay | Admitting: Internal Medicine

## 2019-10-09 DIAGNOSIS — Z1231 Encounter for screening mammogram for malignant neoplasm of breast: Secondary | ICD-10-CM

## 2019-10-15 ENCOUNTER — Emergency Department (HOSPITAL_COMMUNITY)
Admission: EM | Admit: 2019-10-15 | Discharge: 2019-10-15 | Disposition: A | Discharge status: Home / Self Care | Payer: 59 | Attending: Emergency Medicine | Admitting: Emergency Medicine

## 2019-10-15 ENCOUNTER — Other Ambulatory Visit: Payer: Self-pay

## 2019-10-15 ENCOUNTER — Encounter (HOSPITAL_COMMUNITY): Payer: Self-pay

## 2019-10-15 ENCOUNTER — Emergency Department (HOSPITAL_COMMUNITY): Payer: 59

## 2019-10-15 ENCOUNTER — Encounter: Payer: Self-pay | Admitting: Emergency Medicine

## 2019-10-15 ENCOUNTER — Ambulatory Visit (INDEPENDENT_AMBULATORY_CARE_PROVIDER_SITE_OTHER): Payer: 59 | Admitting: Emergency Medicine

## 2019-10-15 VITALS — BP 115/79 | HR 90 | Ht 60.0 in | Wt 147.0 lb

## 2019-10-15 DIAGNOSIS — Z794 Long term (current) use of insulin: Secondary | ICD-10-CM | POA: Diagnosis not present

## 2019-10-15 DIAGNOSIS — E1165 Type 2 diabetes mellitus with hyperglycemia: Secondary | ICD-10-CM | POA: Diagnosis not present

## 2019-10-15 DIAGNOSIS — Z79899 Other long term (current) drug therapy: Secondary | ICD-10-CM | POA: Insufficient documentation

## 2019-10-15 DIAGNOSIS — R079 Chest pain, unspecified: Secondary | ICD-10-CM

## 2019-10-15 DIAGNOSIS — R110 Nausea: Secondary | ICD-10-CM | POA: Diagnosis not present

## 2019-10-15 DIAGNOSIS — E119 Type 2 diabetes mellitus without complications: Secondary | ICD-10-CM | POA: Insufficient documentation

## 2019-10-15 DIAGNOSIS — E785 Hyperlipidemia, unspecified: Secondary | ICD-10-CM | POA: Diagnosis not present

## 2019-10-15 DIAGNOSIS — R0789 Other chest pain: Secondary | ICD-10-CM | POA: Diagnosis not present

## 2019-10-15 DIAGNOSIS — Z7982 Long term (current) use of aspirin: Secondary | ICD-10-CM | POA: Diagnosis not present

## 2019-10-15 DIAGNOSIS — J45909 Unspecified asthma, uncomplicated: Secondary | ICD-10-CM | POA: Insufficient documentation

## 2019-10-15 LAB — BASIC METABOLIC PANEL
Anion gap: 9 (ref 5–15)
BUN: 6 mg/dL (ref 6–20)
CO2: 25 mmol/L (ref 22–32)
Calcium: 9.4 mg/dL (ref 8.9–10.3)
Chloride: 103 mmol/L (ref 98–111)
Creatinine, Ser: 0.56 mg/dL (ref 0.44–1.00)
GFR calc Af Amer: >60 mL/min (ref >60)
GFR calc non Af Amer: >60 mL/min (ref >60)
Glucose, Bld: 283 mg/dL — ABNORMAL HIGH (ref 70–99)
Potassium: 4 mmol/L (ref 3.5–5.1)
Sodium: 137 mmol/L (ref 135–145)

## 2019-10-15 LAB — CBC
HCT: 36.8 % (ref 36.0–46.0)
Hemoglobin: 11.8 g/dL — ABNORMAL LOW (ref 12.0–15.0)
MCH: 27.4 pg (ref 26.0–34.0)
MCHC: 32.1 g/dL (ref 30.0–36.0)
MCV: 85.4 fL (ref 80.0–100.0)
Platelets: 387 10*3/uL (ref 150–400)
RBC: 4.31 MIL/uL (ref 3.87–5.11)
RDW: 13 % (ref 11.5–15.5)
WBC: 7.8 10*3/uL (ref 4.0–10.5)
nRBC: 0 % (ref 0.0–0.2)

## 2019-10-15 LAB — TROPONIN I (HIGH SENSITIVITY)
Troponin I (High Sensitivity): <2 ng/L (ref <18)
Troponin I (High Sensitivity): 3 ng/L (ref <18)

## 2019-10-15 MED ORDER — CYCLOBENZAPRINE HCL 10 MG PO TABS
10.0000 mg | ORAL_TABLET | Freq: Every day | ORAL | 0 refills | Status: DC
Start: 2019-10-15 — End: 2019-10-15

## 2019-10-15 MED ORDER — CYCLOBENZAPRINE HCL 10 MG PO TABS
10.0000 mg | ORAL_TABLET | Freq: Once | ORAL | Status: AC
  Administered 2019-10-15: 10 mg via ORAL
  Filled 2019-10-15: qty 1

## 2019-10-15 MED ORDER — CYCLOBENZAPRINE HCL 10 MG PO TABS
10.0000 mg | ORAL_TABLET | Freq: Every day | ORAL | 0 refills | Status: DC
Start: 2019-10-15 — End: 2019-11-24

## 2019-10-15 NOTE — ED Provider Notes (Signed)
[facility] EMERGENCY DEPARTMENT Provider Note   CSN: [id number] Arrival date & time: 10/15/19  1442     History Chief Complaint  Patient presents with  . Chest Pain    Rachel [name] is a 54 y.o. female.  HPI HPI Comments: Rachel [name] is a 54 y.o. female who presents to the Emergency Department complaining of chest pain.  Patient states she was sleeping last night and woke up at about 1 in the morning with 8/10 central, left-sided chest pain.  Her pain worsens with palpation.  She states it feels like a squeezing from the back to the front of her chest further noting palpable pain on her left back.  She took 325 ASA last night and then again this morning without significant relief.  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  She then came to the emergency department for reevaluation.  She states her pain is currently at a 5/10.  She states her entire left upper extremity feels "sore".  She reports some intermittent nausea without vomiting but none currently.  She denies fevers, chills, recent illnesses, shortness of breath, abdominal pain, diarrhea, urinary changes, syncope.    Past Medical History:  Diagnosis Date  . ALLERGIC RHINITIS 10/04/2007  . Anemia 01/21/2011  . ASTHMA 08/02/2007   INHALERS ONLY IN [place]  . Blood transfusion without reported diagnosis    with first child   . COMMON MIGRAINE 05/15/2009  . DIABETES MELLITUS, TYPE II 08/02/2007  . GERD 08/02/2007  . HYPERLIPIDEMIA 08/02/2007  . INSOMNIA-SLEEP DISORDER-UNSPEC 01/04/2008  . INTERMITTENT VERTIGO 05/15/2009  . LIBIDO, DECREASED 01/09/2010    Patient Active Problem List   Diagnosis Date Noted  . 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 Osteoporosis 04/25/2019  . Right knee pain 01/31/2019  . Anxiety 11/15/2018  . Nonallopathic lesion of rib cage 12/21/2017  . Cervicogenic  headache 04/27/2017  . Nonallopathic lesion of cervical region 04/27/2017  . Rheumatoid factor positive 02/10/2016  . Nonallopathic lesion of lumbosacral region 07/29/2015  . Nonallopathic lesion of sacral region 07/29/2015  . Nonallopathic lesion of thoracic region 07/29/2015  . Dysphagia, pharyngoesophageal phase 06/12/2014  . Microcytic anemia 01/21/2011  . Cervical radiculopathy 01/21/2011  . Depression 01/09/2010  . Migraine without aura 05/15/2009  . INSOMNIA-SLEEP DISORDER-UNSPEC 01/04/2008  . Type 2 diabetes mellitus with hyperglycemia, with long-term current use of insulin ([place]) 08/02/2007  . Hyperlipidemia 08/02/2007  . Asthma 08/02/2007  . GERD 08/02/2007    Past Surgical History:  Procedure Laterality Date  . CESAREAN SECTION     x 3  . CYST EXCISION     left knee  . PAROTIDECTOMY  10/21/2011   Procedure: PAROTIDECTOMY;  Surgeon: Melida Quitter, MD;  [facility];  Service: ENT;  Laterality: Left;     OB History   No obstetric history on file.     Family History  Problem Relation Age of Onset  . Hypertension Father   . Diabetes Father   . Asthma Father   . Cervical cancer Maternal Aunt   . Heart disease Maternal Aunt   . Anesthesia problems Neg Hx   . Colon cancer Neg Hx     Social History   Tobacco Use  . Smoking status: Never Smoker  . Smokeless tobacco: Never Used  Substance Use Topics  . Alcohol use: No    Alcohol/week:  0.0 standard drinks  . Drug use: No    Home Medications Prior to Admission medications   Medication Sig Start Date End Date Taking? Authorizing Provider  aspirin 81 MG EC tablet Take 81 mg by mouth daily.      [provider]  atenolol (TENORMIN) 25 MG tablet Take 1 tablet (25 mg total) by mouth daily. X 7 days, then increase to 2 tabs (50mg ) daily 12/29/17   Pieter Partridge, DO  Blood Glucose Monitoring Suppl (FREESTYLE LITE) DEVI Use as directed four times daily E11.9 11/14/18   Biagio Borg, MD   butalbital-acetaminophen-caffeine (FIORICET, ESGIC) [phone] MG tablet TAKE 1 TABLET BY MOUTH EVERY 6 HOURS AS NEEDED FOR HEADACHE (MAX OF 1 OR 2 TABLETS PER DAY) 08/23/18   Biagio Borg, MD  cyclobenzaprine (FLEXERIL) 5 MG tablet Take 1 tablet (5 mg total) by mouth 3 (three) times daily as needed for muscle spasms. 02/28/19   Biagio Borg, MD  DENTA 5000 PLUS 1.1 % CREA dental cream  08/05/16   [provider]  Diclofenac Sodium (PENNSAID) 2 % SOLN Place 2 g onto the skin 2 (two) times daily. 09/05/19   Lyndal Pulley, DO  esomeprazole (NEXIUM) 40 MG capsule TAKE 1 CAPSULE BY MOUTH 2 TIMES DAILY BEFORE A MEAL. 01/25/19   Biagio Borg, MD  fexofenadine (ALLEGRA) 180 MG tablet Take 1 tablet (180 mg total) by mouth daily. 10/04/19 10/03/20  Biagio Borg, MD  Fremanezumab-vfrm (AJOVY) 225 MG/1.5ML SOAJ Inject 225 mg into the skin every 30 (thirty) days. 08/29/19   Jaffe, Adam R, DO  gabapentin (NEURONTIN) 300 MG capsule TAKE 1 CAPSULE BY MOUTH 2 TIMES DAILY. 08/29/19   Pieter Partridge, DO  glucose blood (FREESTYLE LITE) test strip Use as instructed four times daily E11.9 11/14/18   Biagio Borg, MD  insulin glargine (LANTUS SOLOSTAR) 100 UNIT/ML Solostar Pen INJECT 34 UNITS INTO THE SKIN IN THE MORNING. 09/05/19   Philemon Kingdom, MD  insulin lispro (HUMALOG KWIKPEN) 100 UNIT/ML KwikPen Inject 0.05-0.08 mLs (5-8 Units total) into the skin 3 (three) times daily. 09/05/19   Philemon Kingdom, MD  Insulin Pen Needle (UNIFINE PENTIPS) 32G X 4 MM MISC USE 4x a day 12/20/17   Philemon Kingdom, MD  Lancets (FREESTYLE) lancets Use as instructed four times daily E11.9 11/14/18   Biagio Borg, MD  Lasmiditan Succinate (REYVOW) 100 MG TABS Take 100 mg by mouth as needed. Take 1 tablet for headache. No more than 1 tablet in 24 hours 09/12/18   Pieter Partridge, DO  linaclotide [facility]) 145 MCG CAPS capsule Take 1 capsule (145 mcg total) by mouth daily before breakfast. 11/14/18   Biagio Borg, MD  metoCLOPramide  (REGLAN) 5 MG tablet Take 1 tablet (5 mg total) by mouth every 8 (eight) hours as needed for nausea or vomiting. 01/24/19   Biagio Borg, MD  rosuvastatin (CRESTOR) 40 MG tablet TAKE 1 TABLET (40 MG TOTAL) BY MOUTH DAILY. 08/30/18   Biagio Borg, MD  Semaglutide,0.25 or 0.5MG /DOS, (OZEMPIC, 0.25 OR 0.5 MG/DOSE,) 2 MG/1.5ML SOPN Inject 0.5 mg into the skin once a week. 07/02/19   Philemon Kingdom, MD  traMADol (ULTRAM) 50 MG tablet TAKE 1 TABLET BY MOUTH EVERY 8 HOURS AS NEEDED 04/04/19   Biagio Borg, MD  triamcinolone (NASACORT) 55 MCG/ACT AERO nasal inhaler Place 2 sprays into the nose daily. 10/04/19   Biagio Borg, MD    Allergies  Lipitor [atorvastatin] and Nitroglycerin  Review of Systems   Review of Systems  All other systems reviewed and are negative. Ten systems reviewed and are negative for acute change, except as noted in the HPI.   Physical Exam Updated Vital Signs BP 117/84   Pulse (!) 104   Temp 98.4 F (36.9 C) (Oral)   Resp 18   Ht 5' (1.524 m)   Wt 66.7 kg   SpO2 99%   BMI 28.72 kg/m   Physical Exam Vitals and nursing note reviewed.  Constitutional:      General: She is not in acute distress.    Appearance: Normal appearance. She is well-developed. She is not ill-appearing, toxic-appearing or diaphoretic.  HENT:     Head: Normocephalic and atraumatic.     Right Ear: External ear normal.     Left Ear: External ear normal.     Nose: Nose normal.     Mouth/Throat:     Mouth: Mucous membranes are moist.     Pharynx: Oropharynx is clear. No oropharyngeal exudate or posterior oropharyngeal erythema.  Eyes:     Extraocular Movements: Extraocular movements intact.     Pupils: Pupils are equal, round, and reactive to light.  Cardiovascular:     Rate and Rhythm: Normal rate and regular rhythm.  No extrasystoles are present.    Pulses: Normal pulses.          Radial pulses are 2+ on the right side and 2+ on the left side.       Dorsalis pedis pulses are 2+ on  the right side and 2+ on the left side.     Heart sounds: Normal heart sounds. Heart sounds not distant. No murmur. No systolic murmur. No diastolic murmur. No friction rub. No gallop.   Pulmonary:     Effort: Pulmonary effort is normal. No tachypnea, accessory muscle usage or respiratory distress.     Breath sounds: Normal breath sounds. No stridor. No wheezing, rhonchi or rales.  Chest:     Chest wall: Tenderness present. No mass, deformity, crepitus or edema.     Comments: Moderate TTP noted to the left anterior chest wall. Abdominal:     General: Abdomen is flat.     Palpations: Abdomen is soft.     Tenderness: There is no abdominal tenderness.  Musculoskeletal:        General: Normal range of motion.     Cervical back: Normal range of motion and neck supple. No tenderness.     Right lower leg: No tenderness. No edema.     Left lower leg: No tenderness. No edema.     Comments: TTP noted to the left anterior shoulder, musculature of the superior shoulder, left lateral paraspinal musculature of the cervical spine.  Diffuse TTP noted to the left lateral thoracic musculature.  No midline C, T, L-spine tenderness.  No erythema, ecchymosis, edema.  No visible signs of trauma.  Diffuse TTP noted across the upper portion of the left upper extremity.  Skin:    General: Skin is warm and dry.  Neurological:     General: No focal deficit present.     Mental Status: She is alert and oriented to person, place, and time.  Psychiatric:        Mood and Affect: Mood normal.        Behavior: Behavior normal.    ED Results / Procedures / Treatments   Labs (all labs ordered are listed, but only abnormal results are displayed)  Labs Reviewed  BASIC METABOLIC PANEL - Abnormal; Notable for the following components:      Result Value   Glucose, Bld 283 (*)    All other components within normal limits  CBC - Abnormal; Notable for the following components:   Hemoglobin 11.8 (*)    All other  components within normal limits  TROPONIN I (HIGH SENSITIVITY)  TROPONIN I (HIGH SENSITIVITY)    EKG EKG Interpretation  Date/Time:  Monday Oct 15 2019 14:55:03 EDT Ventricular Rate:  102 PR Interval:  132 QRS Duration: 78 QT Interval:  330 QTC Calculation: 430 R Axis:   125 Text Interpretation: Sinus tachycardia Left posterior fascicular block Possible Anterior infarct , age undetermined Abnormal ECG No significant change since last tracing Confirmed by Gareth Morgan [phone]) on 10/15/2019 6:08:31 PM   Radiology DG Chest 2 View  Result Date: 10/15/2019 CLINICAL DATA:  Chest pain additional history provided: Patient reports chest pain at 1 a.m. this morning, took baby aspirin without relief, pain has improved but feeling nauseated at this time, history of type 2 diabetes. EXAM: CHEST - 2 VIEW COMPARISON:  Chest radiograph 08/25/2018 FINDINGS: Heart size within normal limits. There is no appreciable airspace consolidation. No evidence of pleural effusion or pneumothorax. No acute bony abnormality identified. IMPRESSION: No evidence of acute cardiopulmonary abnormality. Electronically Signed   By: Kellie Simmering DO   On: 10/15/2019 15:22    Procedures Procedures   Medications Ordered in ED Medications  cyclobenzaprine (FLEXERIL) tablet 10 mg (10 mg Oral Given 10/15/19 2109)    ED Course  I have reviewed the triage vital signs and the nursing notes.  Pertinent labs & imaging results that were available during my care of the patient were reviewed by me and considered in my medical decision making (see chart for details).    MDM Rules/Calculators/A&P                      Patient is a pleasant 54 year old female that presents with atraumatic left anterior chest wall, shoulder, left upper extremity, left thoracic region pain that began last night while she was sleeping.  Her physical exam is generally reassuring.  She has palpable pain in all the prior mentioned regions.  Her labs are  generally reassuring.  She is hyperglycemic at 283 but compared to prior values this seems to be baseline for her.  No anion gap.  Doubt DKA.  She is mildly anemic at 11.8.  Patient states she has a history of anemia.  Her troponin was negative which was repeated and was negative again.  Her heart score was calculated and was found to be 3 by both myself and my attending physician Dr. Gareth Morgan who also evaluated the patient.  She was given a short course of Flexeril as well as a work note for tomorrow.  She has a cardiologist already and states she will follow up with her cardiologist as well as her primary care provider tomorrow.  Strict return precautions were given and she understands she can return to the emergency department with any new or worsening symptoms.  She was initially tachycardic upon arrival but this has improved and her pulse has been in the 80s at both reevaluations.  Her questions were answered and she was amicable at the time of discharge.  Her vital signs are stable.  Patient discharged to home/self care.  Condition at discharge: Stable  Note: Portions of this report may have been transcribed using voice recognition  software. Every effort was made to ensure accuracy; however, inadvertent computerized transcription errors may be present.    Final Clinical Impression(s) / ED Diagnoses Final diagnoses:  Chest pain, unspecified type    Rx / DC Orders ED Discharge Orders         Ordered    cyclobenzaprine (FLEXERIL) 10 MG tablet  Daily at bedtime     10/15/19 2058    cyclobenzaprine (FLEXERIL) 10 MG tablet  Daily at bedtime,   Status:  Discontinued     10/15/19 2102           Rayna Sexton, PA-C 10/15/19 2114    Gareth Morgan, MD 10/16/19 0008

## 2019-10-15 NOTE — ED Triage Notes (Signed)
Pt reports chest pain at 1am this morning, took 4 baby ASA without relief. States the pain has improved but is feeling nauseated. Hx of type 2 diabetes, no cardiac hx.

## 2019-10-15 NOTE — Progress Notes (Signed)
Rachel [name] 54 y.o.   No chief complaint on file.   HISTORY OF PRESENT ILLNESS: This is a 54 y.o. female diabetic insulin-dependent complaining of left-sided squeezing-pressure chest pain that started at 1 in the morning today and has persisted all morning.  Took aspirin at the onset and then again at 7 in the morning and again 20 minutes ago.  Pain radiates to left shoulder and down the left arm.  No associated symptoms.  Denies difficulty breathing, nausea or vomiting.  Denies syncope or any other significant symptoms.  HPI   Prior to Admission medications   Medication Sig Start Date End Date Taking? Authorizing Provider  aspirin 81 MG EC tablet Take 81 mg by mouth daily.      [provider]  atenolol (TENORMIN) 25 MG tablet Take 1 tablet (25 mg total) by mouth daily. X 7 days, then increase to 2 tabs (50mg ) daily 12/29/17   Pieter Partridge, DO  Blood Glucose Monitoring Suppl (FREESTYLE LITE) DEVI Use as directed four times daily E11.9 11/14/18   Biagio Borg, MD  butalbital-acetaminophen-caffeine (FIORICET, ESGIC) [phone] MG tablet TAKE 1 TABLET BY MOUTH EVERY 6 HOURS AS NEEDED FOR HEADACHE (MAX OF 1 OR 2 TABLETS PER DAY) 08/23/18   Biagio Borg, MD  cyclobenzaprine (FLEXERIL) 5 MG tablet Take 1 tablet (5 mg total) by mouth 3 (three) times daily as needed for muscle spasms. 02/28/19   Biagio Borg, MD  DENTA 5000 PLUS 1.1 % CREA dental cream  08/05/16   [provider]  Diclofenac Sodium (PENNSAID) 2 % SOLN Place 2 g onto the skin 2 (two) times daily. 09/05/19   Lyndal Pulley, DO  esomeprazole (NEXIUM) 40 MG capsule TAKE 1 CAPSULE BY MOUTH 2 TIMES DAILY BEFORE A MEAL. 01/25/19   Biagio Borg, MD  fexofenadine (ALLEGRA) 180 MG tablet Take 1 tablet (180 mg total) by mouth daily. 10/04/19 10/03/20  Biagio Borg, MD  Fremanezumab-vfrm (AJOVY) 225 MG/1.5ML SOAJ Inject 225 mg into the skin every 30 (thirty) days. 08/29/19   Jaffe, Adam R, DO  gabapentin (NEURONTIN) 300 MG  capsule TAKE 1 CAPSULE BY MOUTH 2 TIMES DAILY. 08/29/19   Pieter Partridge, DO  glucose blood (FREESTYLE LITE) test strip Use as instructed four times daily E11.9 11/14/18   Biagio Borg, MD  insulin glargine (LANTUS SOLOSTAR) 100 UNIT/ML Solostar Pen INJECT 34 UNITS INTO THE SKIN IN THE MORNING. 09/05/19   Philemon Kingdom, MD  insulin lispro (HUMALOG KWIKPEN) 100 UNIT/ML KwikPen Inject 0.05-0.08 mLs (5-8 Units total) into the skin 3 (three) times daily. 09/05/19   Philemon Kingdom, MD  Insulin Pen Needle (UNIFINE PENTIPS) 32G X 4 MM MISC USE 4x a day 12/20/17   Philemon Kingdom, MD  Lancets (FREESTYLE) lancets Use as instructed four times daily E11.9 11/14/18   Biagio Borg, MD  Lasmiditan Succinate (REYVOW) 100 MG TABS Take 100 mg by mouth as needed. Take 1 tablet for headache. No more than 1 tablet in 24 hours 09/12/18   Pieter Partridge, DO  linaclotide [facility]) 145 MCG CAPS capsule Take 1 capsule (145 mcg total) by mouth daily before breakfast. 11/14/18   Biagio Borg, MD  metoCLOPramide (REGLAN) 5 MG tablet Take 1 tablet (5 mg total) by mouth every 8 (eight) hours as needed for nausea or vomiting. 01/24/19   Biagio Borg, MD  rosuvastatin (CRESTOR) 40 MG tablet TAKE 1 TABLET (40 MG TOTAL) BY MOUTH DAILY. 08/30/18  Biagio Borg, MD  Semaglutide,0.25 or 0.5MG /DOS, (OZEMPIC, 0.25 OR 0.5 MG/DOSE,) 2 MG/1.5ML SOPN Inject 0.5 mg into the skin once a week. 07/02/19   Philemon Kingdom, MD  traMADol (ULTRAM) 50 MG tablet TAKE 1 TABLET BY MOUTH EVERY 8 HOURS AS NEEDED 04/04/19   Biagio Borg, MD  triamcinolone (NASACORT) 55 MCG/ACT AERO nasal inhaler Place 2 sprays into the nose daily. 10/04/19   Biagio Borg, MD    Allergies  Allergen Reactions  . Lipitor [Atorvastatin]     REACTION: myalgias  . Nitroglycerin     Patient Active Problem List   Diagnosis Date Noted  . 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 Osteoporosis 04/25/2019  . Right knee pain 01/31/2019  . Anxiety 11/15/2018  .  Nonallopathic lesion of rib cage 12/21/2017  . Cervicogenic headache 04/27/2017  . Nonallopathic lesion of cervical region 04/27/2017  . Rheumatoid factor positive 02/10/2016  . Nonallopathic lesion of lumbosacral region 07/29/2015  . Nonallopathic lesion of sacral region 07/29/2015  . Nonallopathic lesion of thoracic region 07/29/2015  . Dysphagia, pharyngoesophageal phase 06/12/2014  . Microcytic anemia 01/21/2011  . Cervical radiculopathy 01/21/2011  . Depression 01/09/2010  . Migraine without aura 05/15/2009  . INSOMNIA-SLEEP DISORDER-UNSPEC 01/04/2008  . Type 2 diabetes mellitus with hyperglycemia, with long-term current use of insulin ([place]) 08/02/2007  . Hyperlipidemia 08/02/2007  . Asthma 08/02/2007  . GERD 08/02/2007    Past Medical History:  Diagnosis Date  . ALLERGIC RHINITIS 10/04/2007  . Anemia 01/21/2011  . ASTHMA 08/02/2007   INHALERS ONLY IN [place]  . Blood transfusion without reported diagnosis    with first child   . COMMON MIGRAINE 05/15/2009  . DIABETES MELLITUS, TYPE II 08/02/2007  . GERD 08/02/2007  . HYPERLIPIDEMIA 08/02/2007  . INSOMNIA-SLEEP DISORDER-UNSPEC 01/04/2008  . INTERMITTENT VERTIGO 05/15/2009  . LIBIDO, DECREASED 01/09/2010    Past Surgical History:  Procedure Laterality Date  . CESAREAN SECTION     x 3  . CYST EXCISION     left knee  . PAROTIDECTOMY  10/21/2011   Procedure: PAROTIDECTOMY;  Surgeon: Melida Quitter, MD;  Location: [facility] OR;  Service: ENT;  Laterality: Left;    Social History   Socioeconomic History  . Marital status: Married    Spouse name: [name]  . Number of children: 3  . Years of education: Degree  . Highest education level: Not on file  Occupational History  . Occupation: Research scientist (life sciences): [employer]  . Occupation: Chartered certified accountant    Employer: [employer]  Tobacco Use  . Smoking status: Never Smoker  . Smokeless tobacco: Never Used  Substance and Sexual Activity  . Alcohol use: No    Alcohol/week: 0.0  standard drinks  . Drug use: No  . Sexual activity: Yes  Other Topics Concern  . Not on file  Social History Narrative   She has 3 daughters.   Patient has a 4 year degree.    Patient working at [employer].    Patient is married to [place].   Social Determinants of Health   Financial Resource Strain:   . Difficulty of Paying Living Expenses:   Food Insecurity:   . Worried About Charity fundraiser in the Last Year:   . Arboriculturist in the Last Year:   Transportation Needs:   . Film/video editor (Medical):   Marland Kitchen Lack of Transportation (Non-Medical):   Physical Activity:   . Days of  Exercise per Week:   . Minutes of Exercise per Session:   Stress:   . Feeling of Stress :   Social Connections:   . Frequency of Communication with Friends and Family:   . Frequency of Social Gatherings with Friends and Family:   . Attends Religious Services:   . Active Member of Clubs or Organizations:   . Attends Archivist Meetings:   Marland Kitchen Marital Status:   Intimate Partner Violence:   . Fear of Current or Ex-Partner:   . Emotionally Abused:   Marland Kitchen Physically Abused:   . Sexually Abused:     Family History  Problem Relation Age of Onset  . Hypertension Father   . Diabetes Father   . Asthma Father   . Cervical cancer Maternal Aunt   . Heart disease Maternal Aunt   . Anesthesia problems Neg Hx   . Colon cancer Neg Hx      Review of Systems  Constitutional: Negative.  Negative for chills and fever.  HENT: Negative.  Negative for congestion and sore throat.   Respiratory: Negative.  Negative for cough and shortness of breath.   Cardiovascular: Positive for chest pain. Negative for palpitations and leg swelling.  Gastrointestinal: Negative.  Negative for abdominal pain, blood in stool, diarrhea, melena, nausea and vomiting.  Genitourinary: Negative.  Negative for dysuria and hematuria.  Musculoskeletal: Negative.  Negative for myalgias.  Skin: Negative.  Negative for rash.   Neurological: Negative.  Negative for dizziness and headaches.  All other systems reviewed and are negative.  Vitals:   10/15/19 1332  BP: 115/79  Pulse: 90  SpO2: 97%     Physical Exam Vitals reviewed.  Constitutional:      Appearance: Normal appearance.  HENT:     Head: Normocephalic.  Eyes:     Extraocular Movements: Extraocular movements intact.     Pupils: Pupils are equal, round, and reactive to light.  Cardiovascular:     Rate and Rhythm: Normal rate and regular rhythm.     Pulses: Normal pulses.     Heart sounds: Normal heart sounds.  Pulmonary:     Effort: Pulmonary effort is normal.     Breath sounds: Normal breath sounds.  Musculoskeletal:        General: Normal range of motion.     Cervical back: Normal range of motion and neck supple.  Skin:    General: Skin is warm and dry.     Capillary Refill: Capillary refill takes less than 2 seconds.  Neurological:     General: No focal deficit present.     Mental Status: She is alert and oriented to person, place, and time.  Psychiatric:        Mood and Affect: Mood normal.        Behavior: Behavior normal.    EKG: Normal sinus rhythm with normal ventricular rate.  No acute ischemic changes.  Normal EKG.  ASSESSMENT & PLAN: Advised to go to the emergency room for further evaluation and treatment.  Preferred method of transportation EMS ambulance with ACLS equipment. Patient prefers to go with family member due to cost.  Diagnoses and all orders for this visit:  Chest pain, unspecified type  Type 2 diabetes mellitus with hyperglycemia, with long-term current use of insulin ([place])  Dyslipidemia      Agustina Caroli, MD [facility] [place]

## 2019-10-15 NOTE — Discharge Instructions (Signed)
Per our discussion, I am prescribing you Flexeril for her pain.  This medication is a sedating effect so please be sure not to take it prior to operating a motor vehicle and please do not mix with alcohol.  Please follow-up with your primary care provider as well as your cardiologist regarding this visit.  Please do not hesitate to return to the emergency department any new or worsening symptoms.

## 2019-10-16 ENCOUNTER — Other Ambulatory Visit: Payer: Self-pay | Admitting: Internal Medicine

## 2019-10-16 ENCOUNTER — Other Ambulatory Visit: Payer: Self-pay | Admitting: Neurology

## 2019-10-16 NOTE — Telephone Encounter (Signed)
Done erx 

## 2019-10-23 ENCOUNTER — Telehealth: Payer: Self-pay | Admitting: Family Medicine

## 2019-10-23 NOTE — Telephone Encounter (Signed)
Pt was called today by [facility] for her referral. She expected that Dr. [name] was referring her to a specific provider at that practice, but this was not noted on the referral. She would like to know who Dr. [name] recommends before she agrees to schedule.

## 2019-10-24 NOTE — Telephone Encounter (Signed)
Sent patient MyChart message regarding recommendations.

## 2019-10-26 DIAGNOSIS — Z6828 Body mass index (BMI) 28.0-28.9, adult: Secondary | ICD-10-CM | POA: Insufficient documentation

## 2019-10-26 DIAGNOSIS — M4802 Spinal stenosis, cervical region: Secondary | ICD-10-CM | POA: Diagnosis not present

## 2019-10-29 ENCOUNTER — Telehealth: Payer: Self-pay | Admitting: Internal Medicine

## 2019-10-29 NOTE — Telephone Encounter (Signed)
    Patient calling for status of completion of FMLA forms sent by [employer]

## 2019-10-30 NOTE — Telephone Encounter (Signed)
Spoke with patient and informed I have not received these forms yet from [employer].  Patient is going to follow up with [employer] and I have given fax number given.

## 2019-10-31 NOTE — Telephone Encounter (Signed)
We still not received the forms.

## 2019-10-31 NOTE — Telephone Encounter (Signed)
    Patient calling to confirm if we have received paperwork from [employer] Please call her at [phone]

## 2019-11-01 DIAGNOSIS — Z0279 Encounter for issue of other medical certificate: Secondary | ICD-10-CM

## 2019-11-01 NOTE — Telephone Encounter (Signed)
I received forms via fax, Patient informed.  Forms have been completed &Placed in providers box to review and sign.

## 2019-11-01 NOTE — Telephone Encounter (Signed)
Forms signed, Faxed to [employer], Copy sent to scan &Charged for.   Patient informed and original mailed to patient for her records.

## 2019-11-07 ENCOUNTER — Ambulatory Visit: Payer: 59 | Admitting: Family Medicine

## 2019-11-07 NOTE — Progress Notes (Deleted)
[place] Phone: [phone] Subjective:    I'm seeing this patient by the request  of:  Biagio Borg, MD  CC:   [id number]  Rachel [name] is a 54 y.o. female coming in with complaint of neck pain. Last seen on 10/04/2019 for OMT. Patient states        Past Medical History:  Diagnosis Date  . ALLERGIC RHINITIS 10/04/2007  . Anemia 01/21/2011  . Anxiety 11/15/2018  . ASTHMA 08/02/2007   INHALERS ONLY IN [place]  . Asthma 08/02/2007   Qualifier: Diagnosis of  By: Wynona Luna   . Blood transfusion without reported diagnosis    with first child   . Cervical radiculopathy 01/21/2011  . Cervicogenic headache 04/27/2017  . COMMON MIGRAINE 05/15/2009  . Depression 01/09/2010   Qualifier: Diagnosis of  By: Jenny Reichmann MD, Hunt Oris   . DIABETES MELLITUS, TYPE II 08/02/2007  . Dysphagia, pharyngoesophageal phase 06/12/2014  . GERD 08/02/2007  . HYPERLIPIDEMIA 08/02/2007  . Hyperlipidemia 08/02/2007   Qualifier: Diagnosis of  By: Wynona Luna   . INSOMNIA-SLEEP DISORDER-UNSPEC 01/04/2008  . INTERMITTENT VERTIGO 05/15/2009  . LIBIDO, DECREASED 01/09/2010  . Migraine without aura 05/15/2009   Qualifier: Diagnosis of  By: Jenny Reichmann MD, Hunt Oris   . Nonallopathic lesion of rib cage 12/21/2017  . Nonallopathic lesion of sacral region 07/29/2015  . Nonallopathic lesion of thoracic region 07/29/2015  . Osteoporosis 04/25/2019  . 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 Rheumatoid factor positive 02/10/2016  . Right knee pain 01/31/2019   Injected January 31, 2019  . Type 2 diabetes mellitus with hyperglycemia, with long-term current use of insulin ([place]) 08/02/2007   Qualifier: Diagnosis of  By: Wynona Luna    Past Surgical History:  Procedure Laterality Date  . CESAREAN SECTION     x 3  . CYST EXCISION     left knee  . PAROTIDECTOMY  10/21/2011   Procedure: PAROTIDECTOMY;  Surgeon: Melida Quitter, MD;  Location:  [facility] OR;  Service: ENT;  Laterality: Left;   Social History   Socioeconomic History  . Marital status: Married    Spouse name: [name]  . Number of children: 3  . Years of education: Degree  . Highest education level: Not on file  Occupational History  . Occupation: Research scientist (life sciences): [employer]  . Occupation: Chartered certified accountant    Employer: [employer]  Tobacco Use  . Smoking status: Never Smoker  . Smokeless tobacco: Never Used  Substance and Sexual Activity  . Alcohol use: No    Alcohol/week: 0.0 standard drinks  . Drug use: No  . Sexual activity: Yes  Other Topics Concern  . Not on file  Social History Narrative   She has 3 daughters.   Patient has a 4 year degree.    Patient working at [employer].    Patient is married to [place].   Social Determinants of Health   Financial Resource Strain:   . Difficulty of Paying Living Expenses:   Food Insecurity:   . Worried About Charity fundraiser in the Last Year:   . Arboriculturist in the Last Year:   Transportation Needs:   . Film/video editor (Medical):   Marland Kitchen Lack of Transportation (Non-Medical):   Physical Activity:   . Days of Exercise per Week:   . Minutes of Exercise per  Session:   Stress:   . Feeling of Stress :   Social Connections:   . Frequency of Communication with Friends and Family:   . Frequency of Social Gatherings with Friends and Family:   . Attends Religious Services:   . Active Member of Clubs or Organizations:   . Attends Archivist Meetings:   Marland Kitchen Marital Status:    Allergies  Allergen Reactions  . Lipitor [Atorvastatin]     REACTION: myalgias  . Nitroglycerin    Family History  Problem Relation Age of Onset  . Hypertension Father   . Diabetes Father   . Asthma Father   . Cervical cancer Maternal Aunt   . Heart disease Maternal Aunt   . Anesthesia problems Neg Hx   . Colon cancer Neg Hx     Current Outpatient Medications (Endocrine & Metabolic):  .  insulin  glargine (LANTUS SOLOSTAR) 100 UNIT/ML Solostar Pen, INJECT 34 UNITS INTO THE SKIN IN THE MORNING. (Patient taking differently: Inject 34 Units into the skin daily. ) .  insulin lispro (HUMALOG KWIKPEN) 100 UNIT/ML KwikPen, Inject 0.05-0.08 mLs (5-8 Units total) into the skin 3 (three) times daily. (Patient taking differently: Inject 4-8 Units into the skin 3 (three) times daily. Sliding scale : if blood sugar 130 - takes 4 units . If over 150 takes 8 units.) .  Semaglutide,0.25 or 0.5MG /DOS, (OZEMPIC, 0.25 OR 0.5 MG/DOSE,) 2 MG/1.5ML SOPN, Inject 0.5 mg into the skin once a week.  Current Outpatient Medications (Cardiovascular):  .  atenolol (TENORMIN) 25 MG tablet, Take 1 tablet (25 mg total) by mouth daily. X 7 days, then increase to 2 tabs (50mg ) daily (Patient not taking: Reported on 10/15/2019) .  rosuvastatin (CRESTOR) 40 MG tablet, TAKE 1 TABLET (40 MG TOTAL) BY MOUTH DAILY.  Current Outpatient Medications (Respiratory):  .  fexofenadine (ALLEGRA) 180 MG tablet, Take 1 tablet (180 mg total) by mouth daily. (Patient not taking: Reported on 10/15/2019) .  triamcinolone (NASACORT) 55 MCG/ACT AERO nasal inhaler, Place 2 sprays into the nose daily. (Patient not taking: Reported on 10/15/2019)  Current Outpatient Medications (Analgesics):  .  aspirin 81 MG EC tablet, Take 81 mg by mouth daily.   .  butalbital-acetaminophen-caffeine (FIORICET) 50-325-40 MG tablet, TAKE 1 TABLET BY MOUTH EVERY 6 HOURS AS NEEDED FOR HEADACHE (MAX OF 1 OR 2 TABLETS PER DAY) .  Fremanezumab-vfrm (AJOVY) 225 MG/1.5ML SOAJ, Inject 225 mg into the skin every 30 (thirty) days. [place] Succinate (REYVOW) 100 MG TABS, Take 100 mg by mouth as needed. Take 1 tablet for headache. No more than 1 tablet in 24 hours .  traMADol (ULTRAM) 50 MG tablet, TAKE 1 TABLET BY MOUTH EVERY 8 HOURS AS NEEDED (Patient taking differently: Take 50 mg by mouth 3 (three) times daily as needed for severe pain. )  Current Outpatient Medications  (Hematological):  .  ferrous sulfate 324 MG TBEC, Take 324 mg by mouth daily.  Current Outpatient Medications (Other):  .  Blood Glucose Monitoring Suppl (FREESTYLE LITE) DEVI, Use as directed four times daily E11.9 .  Cholecalciferol (VITAMIN D) 50 MCG (2000 UT) CAPS, Take 4,000 Units by mouth daily. .  cyclobenzaprine (FLEXERIL) 10 MG tablet, Take 1 tablet (10 mg total) by mouth at bedtime. .  Diclofenac Sodium (PENNSAID) 2 % SOLN, Place 2 g onto the skin 2 (two) times daily. (Patient not taking: Reported on 10/15/2019) .  esomeprazole (NEXIUM) 40 MG capsule, TAKE 1 CAPSULE BY MOUTH 2 TIMES DAILY  BEFORE A MEAL. (Patient taking differently: Take 40 mg by mouth 2 (two) times daily before a meal. ) .  gabapentin (NEURONTIN) 300 MG capsule, TAKE 1 CAPSULE BY MOUTH 2 TIMES DAILY. (Patient taking differently: Take 300 mg by mouth in the morning and at bedtime. Marland Kitchen) .  glucose blood (FREESTYLE LITE) test strip, Use as instructed four times daily E11.9 .  Insulin Pen Needle (UNIFINE PENTIPS) 32G X 4 MM MISC, USE 4x a day .  Lancets (FREESTYLE) lancets, Use as instructed four times daily E11.9 .  linaclotide (LINZESS) 145 MCG CAPS capsule, Take 1 capsule (145 mcg total) by mouth daily before breakfast. (Patient taking differently: Take 145 mcg by mouth daily as needed (for irritable bowel movement). ) .  metoCLOPramide (REGLAN) 5 MG tablet, Take 1 tablet (5 mg total) by mouth every 8 (eight) hours as needed for nausea or vomiting. .  Multiple Vitamin (MULTIVITAMIN WITH MINERALS) TABS tablet, Take 1 tablet by mouth daily.   Reviewed prior external information including notes and imaging from  primary care provider As well as notes that were available from care everywhere and other healthcare systems.  Past medical history, social, surgical and family history all reviewed in electronic medical record.  No pertanent information unless stated regarding to the chief complaint.   Review of Systems:  No  headache, visual changes, nausea, vomiting, diarrhea, constipation, dizziness, abdominal pain, skin rash, fevers, chills, night sweats, weight loss, swollen lymph nodes, body aches, joint swelling, chest pain, shortness of breath, mood changes. POSITIVE muscle aches  Objective  There were no vitals taken for this visit.   General: No apparent distress alert and oriented x3 mood and affect normal, dressed appropriately.  HEENT: Pupils equal, extraocular movements intact  Respiratory: Patient's speak in full sentences and does not appear short of breath  Cardiovascular: No lower extremity edema, non tender, no erythema  Neuro: Cranial nerves II through XII are intact, neurovascularly intact in all extremities with 2+ DTRs and 2+ pulses.  Gait normal with good balance and coordination.  MSK:  Non tender with full range of motion and good stability and symmetric strength and tone of shoulders, elbows, wrist, hip, knee and ankles bilaterally.     Impression and Recommendations:     This case required medical decision making of moderate complexity. The above documentation has been reviewed and is accurate and complete Jacqual[facility] [place]       Note: This dictation was prepared with Dragon dictation along with smaller phrase technology. Any transcriptional errors that result from this process are unintentional.

## 2019-11-08 ENCOUNTER — Ambulatory Visit: Payer: 59 | Admitting: Family Medicine

## 2019-11-08 NOTE — Progress Notes (Deleted)
CARDIOLOGY CONSULT NOTE       Patient ID: Taliyah Bilderback MRN: [id number] DOB/AGE: [birth year] 54 y.o.  Admit date: (Not on file) Referring Physician: [place] PA-C Primary Physician: Biagio Borg, MD Primary Cardiologist: New Reason for Consultation: Chest Pain   Active Problems:   * No active hospital problems. *   HPI:  54 y.o. with DM,HLD Asthma. Seen in [facility] ER for atypical chest pain 10/15/19. R/O , normal CXR normal ECG. Pain occurred while sleeping  and woke up at about 1 in the morning with 8/10 central, left-sided chest pain.  Her pain worsens with palpation.  She states it felt like a squeezing from the back to the front of her chest further noting palpable pain on her left back.  She took 325 ASA  x2  without significant relief.  She works as a Chartered certified accountant and EKG in office which was negative at the time.  She then came to the emergency department for reevaluation.  Pain decreased to 5/10.  She states her entire left upper extremity feels "sore".  She reports some intermittent nausea without vomiting but none currently.  She denies fevers, chills, recent illnesses, shortness of breath, abdominal pain, diarrhea, urinary changes, syncope.  Her DM is poorly controlled A1c 10 08/29/19  LDL on statin 108 08/29/19   ***   ROS All other systems reviewed and negative except as noted above  Past Medical History:  Diagnosis Date  . ALLERGIC RHINITIS 10/04/2007  . Anemia 01/21/2011  . Anxiety 11/15/2018  . ASTHMA 08/02/2007   INHALERS ONLY IN [place]  . Asthma 08/02/2007   Qualifier: Diagnosis of  By: Wynona Luna   . Blood transfusion without reported diagnosis    with first child   . Cervical radiculopathy 01/21/2011  . Cervicogenic headache 04/27/2017  . COMMON MIGRAINE 05/15/2009  . Depression 01/09/2010   Qualifier: Diagnosis of  By: Jenny Reichmann MD, Hunt Oris   . DIABETES MELLITUS, TYPE II 08/02/2007  . Dysphagia, pharyngoesophageal phase 06/12/2014  . GERD 08/02/2007  .  HYPERLIPIDEMIA 08/02/2007  . Hyperlipidemia 08/02/2007   Qualifier: Diagnosis of  By: Wynona Luna   . INSOMNIA-SLEEP DISORDER-UNSPEC 01/04/2008  . INTERMITTENT VERTIGO 05/15/2009  . LIBIDO, DECREASED 01/09/2010  . Migraine without aura 05/15/2009   Qualifier: Diagnosis of  By: Jenny Reichmann MD, Hunt Oris   . Nonallopathic lesion of rib cage 12/21/2017  . Nonallopathic lesion of sacral region 07/29/2015  . Nonallopathic lesion of thoracic region 07/29/2015  . Osteoporosis 04/25/2019  . 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 Rheumatoid factor positive 02/10/2016  . Right knee pain 01/31/2019   Injected January 31, 2019  . Type 2 diabetes mellitus with hyperglycemia, with long-term current use of insulin ([place]) 08/02/2007   Qualifier: Diagnosis of  By: Wynona Luna     Family History  Problem Relation Age of Onset  . Hypertension Father   . Diabetes Father   . Asthma Father   . Cervical cancer Maternal Aunt   . Heart disease Maternal Aunt   . Anesthesia problems Neg Hx   . Colon cancer Neg Hx     Social History   Socioeconomic History  . Marital status: Married    Spouse name: [name]  . Number of children: 3  . Years of education: Degree  . Highest education level: Not on file  Occupational History  . Occupation: Research scientist (life sciences): [employer]  .  Occupation: Chartered certified accountant    Employer: [employer]  Tobacco Use  . Smoking status: Never Smoker  . Smokeless tobacco: Never Used  Substance and Sexual Activity  . Alcohol use: No    Alcohol/week: 0.0 standard drinks  . Drug use: No  . Sexual activity: Yes  Other Topics Concern  . Not on file  Social History Narrative   She has 3 daughters.   Patient has a 4 year degree.    Patient working at [employer].    Patient is married to [place].   Social Determinants of Health   Financial Resource Strain:   . Difficulty of Paying Living Expenses:   Food Insecurity:   . Worried About Charity fundraiser in the Last  Year:   . Arboriculturist in the Last Year:   Transportation Needs:   . Film/video editor (Medical):   Marland Kitchen Lack of Transportation (Non-Medical):   Physical Activity:   . Days of Exercise per Week:   . Minutes of Exercise per Session:   Stress:   . Feeling of Stress :   Social Connections:   . Frequency of Communication with Friends and Family:   . Frequency of Social Gatherings with Friends and Family:   . Attends Religious Services:   . Active Member of Clubs or Organizations:   . Attends Archivist Meetings:   Marland Kitchen Marital Status:   Intimate Partner Violence:   . Fear of Current or Ex-Partner:   . Emotionally Abused:   Marland Kitchen Physically Abused:   . Sexually Abused:     Past Surgical History:  Procedure Laterality Date  . CESAREAN SECTION     x 3  . CYST EXCISION     left knee  . PAROTIDECTOMY  10/21/2011   Procedure: PAROTIDECTOMY;  Surgeon: Melida Quitter, MD;  Location: [facility] OR;  Service: ENT;  Laterality: Left;      Current Outpatient Medications:  .  aspirin 81 MG EC tablet, Take 81 mg by mouth daily.  , Disp: , Rfl:  .  atenolol (TENORMIN) 25 MG tablet, Take 1 tablet (25 mg total) by mouth daily. X 7 days, then increase to 2 tabs (50mg ) daily (Patient not taking: Reported on 10/15/2019), Disp: 60 tablet, Rfl: 3 .  Blood Glucose Monitoring Suppl (FREESTYLE LITE) DEVI, Use as directed four times daily E11.9, Disp: 1 each, Rfl: 0 .  butalbital-acetaminophen-caffeine (FIORICET) 50-325-40 MG tablet, TAKE 1 TABLET BY MOUTH EVERY 6 HOURS AS NEEDED FOR HEADACHE (MAX OF 1 OR 2 TABLETS PER DAY), Disp: 30 tablet, Rfl: 3 .  Cholecalciferol (VITAMIN D) 50 MCG (2000 UT) CAPS, Take 4,000 Units by mouth daily., Disp: , Rfl:  .  cyclobenzaprine (FLEXERIL) 10 MG tablet, Take 1 tablet (10 mg total) by mouth at bedtime., Disp: 10 tablet, Rfl: 0 .  Diclofenac Sodium (PENNSAID) 2 % SOLN, Place 2 g onto the skin 2 (two) times daily. (Patient not taking: Reported on 10/15/2019), Disp: 112 g, Rfl:  3 .  esomeprazole (NEXIUM) 40 MG capsule, TAKE 1 CAPSULE BY MOUTH 2 TIMES DAILY BEFORE A MEAL. (Patient taking differently: Take 40 mg by mouth 2 (two) times daily before a meal. ), Disp: 180 capsule, Rfl: 3 .  ferrous sulfate 324 MG TBEC, Take 324 mg by mouth daily., Disp: , Rfl:  .  fexofenadine (ALLEGRA) 180 MG tablet, Take 1 tablet (180 mg total) by mouth daily. (Patient not taking: Reported on 10/15/2019), Disp: 30 tablet, Rfl: 2 .  Fremanezumab-vfrm (AJOVY) 225 MG/1.5ML SOAJ, Inject 225 mg into the skin every 30 (thirty) days., Disp: 1 pen, Rfl: 11 .  gabapentin (NEURONTIN) 300 MG capsule, TAKE 1 CAPSULE BY MOUTH 2 TIMES DAILY. (Patient taking differently: Take 300 mg by mouth in the morning and at bedtime. Marland Kitchen), Disp: 60 capsule, Rfl: 5 .  glucose blood (FREESTYLE LITE) test strip, Use as instructed four times daily E11.9, Disp: 400 each, Rfl: 12 .  insulin glargine (LANTUS SOLOSTAR) 100 UNIT/ML Solostar Pen, INJECT 34 UNITS INTO THE SKIN IN THE MORNING. (Patient taking differently: Inject 34 Units into the skin daily. ), Disp: 30 mL, Rfl: 5 .  insulin lispro (HUMALOG KWIKPEN) 100 UNIT/ML KwikPen, Inject 0.05-0.08 mLs (5-8 Units total) into the skin 3 (three) times daily. (Patient taking differently: Inject 4-8 Units into the skin 3 (three) times daily. Sliding scale : if blood sugar 130 - takes 4 units . If over 150 takes 8 units.), Disp: 15 mL, Rfl: 11 .  Insulin Pen Needle (UNIFINE PENTIPS) 32G X 4 MM MISC, USE 4x a day, Disp: 200 each, Rfl: PRN .  Lancets (FREESTYLE) lancets, Use as instructed four times daily E11.9, Disp: 400 each, Rfl: 12 .  Lasmiditan Succinate (REYVOW) 100 MG TABS, Take 100 mg by mouth as needed. Take 1 tablet for headache. No more than 1 tablet in 24 hours, Disp: 8 tablet, Rfl: 3 .  linaclotide (LINZESS) 145 MCG CAPS capsule, Take 1 capsule (145 mcg total) by mouth daily before breakfast. (Patient taking differently: Take 145 mcg by mouth daily as needed (for irritable bowel  movement). ), Disp: 30 capsule, Rfl: 2 .  metoCLOPramide (REGLAN) 5 MG tablet, Take 1 tablet (5 mg total) by mouth every 8 (eight) hours as needed for nausea or vomiting., Disp: 40 tablet, Rfl: 1 .  Multiple Vitamin (MULTIVITAMIN WITH MINERALS) TABS tablet, Take 1 tablet by mouth daily., Disp: , Rfl:  .  rosuvastatin (CRESTOR) 40 MG tablet, TAKE 1 TABLET (40 MG TOTAL) BY MOUTH DAILY., Disp: 90 tablet, Rfl: 3 .  Semaglutide,0.25 or 0.5MG /DOS, (OZEMPIC, 0.25 OR 0.5 MG/DOSE,) 2 MG/1.5ML SOPN, Inject 0.5 mg into the skin once a week., Disp: 2 pen, Rfl: 5 .  traMADol (ULTRAM) 50 MG tablet, TAKE 1 TABLET BY MOUTH EVERY 8 HOURS AS NEEDED (Patient taking differently: Take 50 mg by mouth 3 (three) times daily as needed for severe pain. ), Disp: 60 tablet, Rfl: 1 .  triamcinolone (NASACORT) 55 MCG/ACT AERO nasal inhaler, Place 2 sprays into the nose daily. (Patient not taking: Reported on 10/15/2019), Disp: 1 Inhaler, Rfl: 12    Physical Exam: There were no vitals taken for this visit.   Affect appropriate Healthy:  appears stated age 31: normal Neck supple with no adenopathy JVP normal no bruits no thyromegaly Lungs clear with no wheezing and good diaphragmatic motion Heart:  S1/S2 no murmur, no rub, gallop or click PMI normal Abdomen: benighn, BS positve, no tenderness, no AAA no bruit.  No HSM or HJR Distal pulses intact with no bruits No edema Neuro non-focal Skin warm and dry No muscular weakness   Labs:   Lab Results  Component Value Date   WBC 7.8 10/15/2019   HGB 11.8 (L) 10/15/2019   HCT 36.8 10/15/2019   MCV 85.4 10/15/2019   PLT 387 10/15/2019   No results for input(s): NA, K, CL, CO2, BUN, CREATININE, CALCIUM, PROT, BILITOT, ALKPHOS, ALT, AST, GLUCOSE in the last 168 hours.  Invalid input(s): LABALBU Lab Results  Component Value Date   CKTOTAL 53 07/24/2009   CKMB 0.5 07/24/2009   TROPONINI <0.03 07/01/2014    Lab Results  Component Value Date   CHOL 189  08/29/2019   CHOL 274 (H) 08/23/2018   CHOL 285 (H) 11/23/2017   Lab Results  Component Value Date   HDL 64.10 08/29/2019   HDL 55.20 08/23/2018   HDL 52.20 11/23/2017   Lab Results  Component Value Date   LDLCALC 108 (H) 08/29/2019   LDLCALC 194 (H) 11/23/2017   LDLCALC 198 (H) 01/09/2015   Lab Results  Component Value Date   TRIG 88.0 08/29/2019   TRIG 322.0 (H) 08/23/2018   TRIG 194.0 (H) 11/23/2017   Lab Results  Component Value Date   CHOLHDL 3 08/29/2019   CHOLHDL 5 08/23/2018   CHOLHDL 5 11/23/2017   Lab Results  Component Value Date   LDLDIRECT 191.0 08/23/2018   LDLDIRECT 122.0 06/25/2016   LDLDIRECT 149.0 07/09/2015      Radiology: DG Chest 2 View  Result Date: 10/15/2019 CLINICAL DATA:  Chest pain additional history provided: Patient reports chest pain at 1 a.m. this morning, took baby aspirin without relief, pain has improved but feeling nauseated at this time, history of type 2 diabetes. EXAM: CHEST - 2 VIEW COMPARISON:  Chest radiograph 08/25/2018 FINDINGS: Heart size within normal limits. There is no appreciable airspace consolidation. No evidence of pleural effusion or pneumothorax. No acute bony abnormality identified. IMPRESSION: No evidence of acute cardiopulmonary abnormality. Electronically Signed   By: Kellie Simmering DO   On: 10/15/2019 15:22    EKG: ST rate 102 nonspecific ST changes poor R wave progression    ASSESSMENT AND PLAN:   1. Chest Pain: atypical r/o CRF;s DM and HLD. Discussed options including cardiac CTA *** 2. DM:  Discussed low carb diet.  Target hemoglobin A1c is 6.5 or less.  Continue current medications. 3. HLD:  Continue statin target LDL may change based on calcium score done at time of cardiac CTA 4. Asthma:  No active wheezing continue Allegra and nasacort   Signed: Jenkins Rouge 11/08/2019, 2:38 PM

## 2019-11-09 ENCOUNTER — Other Ambulatory Visit: Payer: Self-pay | Admitting: Internal Medicine

## 2019-11-09 ENCOUNTER — Telehealth: Payer: Self-pay | Admitting: Internal Medicine

## 2019-11-09 MED ORDER — BASAGLAR KWIKPEN 100 UNIT/ML ~~LOC~~ SOPN: 34.0000 [IU] | PEN_INJECTOR | Freq: Every day | SUBCUTANEOUS | 11 refills | Status: DC

## 2019-11-09 NOTE — Telephone Encounter (Signed)
Patient doesn't understand why this refill was denied. She states she takes this medication because some her diabetes causes her to fill nausea.

## 2019-11-09 NOTE — Telephone Encounter (Signed)
Ok to let pt know - lantus was changed to basaglar (same insulin, different name) due to insurance coverage) - so no actual change in treatment

## 2019-11-13 ENCOUNTER — Telehealth: Payer: Self-pay

## 2019-11-13 ENCOUNTER — Other Ambulatory Visit: Payer: Self-pay

## 2019-11-13 ENCOUNTER — Ambulatory Visit
Admission: RE | Admit: 2019-11-13 | Discharge: 2019-11-13 | Disposition: A | Discharge status: Home / Self Care | Payer: 59 | Source: Ambulatory Visit | Attending: Internal Medicine | Admitting: Internal Medicine

## 2019-11-13 DIAGNOSIS — Z1231 Encounter for screening mammogram for malignant neoplasm of breast: Secondary | ICD-10-CM

## 2019-11-13 NOTE — Telephone Encounter (Signed)
Pharmacy sent [place] a fax requesting to change from Lantus to [facility] due to insurance no longer covering Lantus.  Please advise if ok to switch.

## 2019-11-13 NOTE — Telephone Encounter (Signed)
RX was already changed by PCP

## 2019-11-13 NOTE — Telephone Encounter (Signed)
OK - same dose Ty, C

## 2019-11-14 ENCOUNTER — Ambulatory Visit: Payer: 59 | Admitting: Cardiovascular Disease

## 2019-11-14 NOTE — Telephone Encounter (Signed)
Pt is requesting zofran but already rx for phenergan done may 28 - does she need the zofran too?

## 2019-11-14 NOTE — Telephone Encounter (Signed)
My-chart message sent with current changes noted.

## 2019-11-15 NOTE — Telephone Encounter (Signed)
Spoke with pt and informed her of Dr. Gwynn Burly note. Pt states she will try the Phenergan. If it does not work pt states she would like to go back on the Zofran.

## 2019-11-21 ENCOUNTER — Ambulatory Visit: Payer: 59 | Admitting: Family Medicine

## 2019-11-24 ENCOUNTER — Encounter (HOSPITAL_COMMUNITY): Payer: Self-pay | Admitting: *Deleted

## 2019-11-24 ENCOUNTER — Ambulatory Visit (HOSPITAL_COMMUNITY)
Admission: EM | Admit: 2019-11-24 | Discharge: 2019-11-24 | Disposition: A | Discharge status: Home / Self Care | Payer: 59 | Attending: Emergency Medicine | Admitting: Emergency Medicine

## 2019-11-24 ENCOUNTER — Other Ambulatory Visit: Payer: Self-pay

## 2019-11-24 ENCOUNTER — Ambulatory Visit (INDEPENDENT_AMBULATORY_CARE_PROVIDER_SITE_OTHER): Payer: 59

## 2019-11-24 DIAGNOSIS — R0789 Other chest pain: Secondary | ICD-10-CM | POA: Diagnosis not present

## 2019-11-24 DIAGNOSIS — M7918 Myalgia, other site: Secondary | ICD-10-CM

## 2019-11-24 DIAGNOSIS — S299XXA Unspecified injury of thorax, initial encounter: Secondary | ICD-10-CM | POA: Diagnosis not present

## 2019-11-24 DIAGNOSIS — R079 Chest pain, unspecified: Secondary | ICD-10-CM

## 2019-11-24 MED ORDER — KETOROLAC TROMETHAMINE 30 MG/ML IJ SOLN
30.0000 mg | Freq: Once | INTRAMUSCULAR | Status: AC
  Administered 2019-11-24: 30 mg via INTRAMUSCULAR

## 2019-11-24 MED ORDER — CYCLOBENZAPRINE HCL 5 MG PO TABS: 5.0000 mg | ORAL_TABLET | Freq: Two times a day (BID) | ORAL | 0 refills | Status: DC | PRN

## 2019-11-24 MED ORDER — KETOROLAC TROMETHAMINE 30 MG/ML IJ SOLN
INTRAMUSCULAR | Status: AC
  Filled 2019-11-24: qty 1

## 2019-11-24 MED ORDER — NAPROXEN 500 MG PO TABS
500.0000 mg | ORAL_TABLET | Freq: Two times a day (BID) | ORAL | 0 refills | Status: DC
Start: 2019-11-24 — End: 2020-12-30

## 2019-11-24 NOTE — Discharge Instructions (Signed)
Xray negative for fracture, lungs clear  We gave you a shot of Toradol to help with pain, this should begin working approximately 30-40 minutes Continue with Naprosyn twice daily with food to help with pain and inflammation within chest and back You may use flexeril as needed to help with pain. This is a muscle relaxer and causes sedation- please use only at bedtime or when you will be home and not have to drive/work Gentle stretching Alternate ice and heat Please follow-up if any symptoms not improving or worsening
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
## 2019-11-27 ENCOUNTER — Ambulatory Visit: Payer: 59 | Admitting: Family Medicine

## 2019-12-03 ENCOUNTER — Encounter: Payer: Self-pay | Admitting: Emergency Medicine

## 2019-12-13 ENCOUNTER — Ambulatory Visit: Payer: 59 | Admitting: Internal Medicine

## 2020-01-02 ENCOUNTER — Encounter: Payer: Self-pay | Admitting: Internal Medicine

## 2020-01-12 ENCOUNTER — Telehealth: Payer: 59 | Admitting: Physician Assistant

## 2020-01-12 DIAGNOSIS — J988 Other specified respiratory disorders: Secondary | ICD-10-CM

## 2020-01-12 DIAGNOSIS — B9789 Other viral agents as the cause of diseases classified elsewhere: Secondary | ICD-10-CM

## 2020-01-12 MED ORDER — BENZONATATE 100 MG PO CAPS
100.0000 mg | ORAL_CAPSULE | Freq: Two times a day (BID) | ORAL | 0 refills | Status: DC | PRN
Start: 2020-01-12 — End: 2020-01-18

## 2020-01-12 NOTE — Progress Notes (Signed)
E-Visit for Corona Virus Screening  Your current symptoms could be consistent with the coronavirus.  Many health care providers can now test patients at their office but not all are.  [place] has multiple testing sites. For information on our [place] testing locations and hours go to [email or web address]  We are enrolling you in our [place] for [place] . Daily you will receive a questionnaire within the [place] website. Our COVID 19 response team will be monitoring your responses daily.  Testing Information: The COVID-19 Community Testing sites will begin testing BY APPOINTMENT ONLY.  You can schedule online at [email or web address]  If you do not have access to a smart phone or computer you may call [phone] for an appointment.   Additional testing sites in the Community:  . For [facility] Testing sites in [facility]  FaceUpdate.uy  . For Pop-up testing sites in [place]  BowlDirectory.co.uk  . For Testing sites with regular hours [email or web address]  . For [place] [email or web address]  . For [facility] [email or web address]  . For [facility] testing in [place] and [employer] [email or web address]  . For [facility] testing in [facility] [facility]   [email or web address]  For  more information about community testing call [phone]   Please quarantine yourself while awaiting your test results. Please stay home for a minimum of 10 days from the first day of illness with improving symptoms and you have had 24 hours of no fever (without the use of Tylenol  (Acetaminophen) Motrin (Ibuprofen) or any fever reducing medication).  Also - Do not get tested prior to returning to work because once you have had a positive test the test can stay positive for more then a month in some cases.   You should wear a mask or cloth face covering over your nose and mouth if you must be around other people or animals, including pets (even at home). Try to stay at least 6 feet away from other people. This will protect the people around you.  Please continue good preventive care measures, including:  frequent hand-washing, avoid touching your face, cover coughs/sneezes, stay out of crowds and keep a 6 foot distance from others.  COVID-19 is a respiratory illness with symptoms that are similar to the flu. Symptoms are typically mild to moderate, but there have been cases of severe illness and death due to the virus.   The following symptoms may appear 2-14 days after exposure: . Fever . Cough . Shortness of breath or difficulty breathing . Chills . Repeated shaking with chills . Muscle pain . Headache . Sore throat . New loss of taste or smell . Fatigue . Congestion or runny nose . Nausea or vomiting . Diarrhea  Go to the nearest hospital ED for assessment if fever/cough/breathlessness are severe or illness seems like a threat to life.  It is vitally important that if you feel that you have an infection such as this virus or any other virus that you stay home and away from places where you may spread it to others.  You should avoid contact with people age 74 and older.   You can use medication such as A prescription cough medication called Tessalon Perles 100 mg. You may take 1-2 capsules every 8 hours as needed for cough  You may also take acetaminophen (Tylenol) as needed for fever.  Reduce your risk of any infection by using the same precautions used for avoiding the common cold or flu:  Marland Kitchen Wash  your hands often with soap and warm water for at least 20 seconds.   If soap and water are not readily available, use an alcohol-based hand sanitizer with at least 60% alcohol.  . If coughing or sneezing, cover your mouth and nose by coughing or sneezing into the elbow areas of your shirt or coat, into a tissue or into your sleeve (not your hands). . Avoid shaking hands with others and consider head nods or verbal greetings only. . Avoid touching your eyes, nose, or mouth with unwashed hands.  . Avoid close contact with people who are sick. . Avoid places or events with large numbers of people in one location, like concerts or sporting events. . Carefully consider travel plans you have or are making. . If you are planning any travel outside or inside the [place], visit the CDC's Travelers' Health webpage for the latest health notices. . If you have some symptoms but not all symptoms, continue to monitor at home and seek medical attention if your symptoms worsen. . If you are having a medical emergency, call 911.  HOME CARE . Only take medications as instructed by your medical team. . Drink plenty of fluids and get plenty of rest. . A steam or ultrasonic humidifier can help if you have congestion.   GET HELP RIGHT AWAY IF YOU HAVE EMERGENCY WARNING SIGNS** FOR COVID-19. If you or someone is showing any of these signs seek emergency medical care immediately. Call 911 or proceed to your closest emergency facility if: . You develop worsening high fever. . Trouble breathing . Bluish lips or face . Persistent pain or pressure in the chest . New confusion . Inability to wake or stay awake . You cough up blood. . Your symptoms become more severe  **This list is not all possible symptoms. Contact your medical provider for any symptoms that are sever or concerning to you.  MAKE SURE YOU   Understand these instructions.  Will watch your condition.  Will get help right away if you are not doing well or get worse.  Your e-visit answers were reviewed by a board  certified advanced clinical practitioner to complete your personal care plan.  Depending on the condition, your plan could have included both over the counter or prescription medications.  If there is a problem please reply once you have received a response from your provider.  Your safety is important to [place].  If you have drug allergies check your prescription carefully.    You can use MyChart to ask questions about today's visit, request a non-urgent call back, or ask for a work or school excuse for 24 hours related to this e-Visit. If it has been greater than 24 hours you will need to follow up with your provider, or enter a new e-Visit to address those concerns. You will get an e-mail in the next two days asking about your experience.  I hope that your e-visit has been valuable and will speed your recovery. Thank you for using e-visits.  Greater than 5 minutes, yet less than 10 minutes of time have been spent researching, coordinating, and implementing care for this patient today   I spoke with Rachel [name] on the phone today. She describes body aches, fever, cough, sore throat, no SOB. She has been around other that have tested positive for covid. She has not gotten her covid results back yet. She does have a fever but no signs of bacterial infection at this time. She does have a  sore throat  But no difficulty swallowing or symptoms consistent with strep pharyngitis. At this time she will continue symptomatic care but understands that if symptoms worsen or do not improve she will need to be seen in person.

## 2020-01-15 ENCOUNTER — Telehealth (INDEPENDENT_AMBULATORY_CARE_PROVIDER_SITE_OTHER): Payer: 59 | Admitting: Internal Medicine

## 2020-01-15 ENCOUNTER — Other Ambulatory Visit: Payer: Self-pay | Admitting: Adult Health

## 2020-01-15 DIAGNOSIS — U071 COVID-19: Secondary | ICD-10-CM | POA: Diagnosis not present

## 2020-01-15 MED ORDER — ONDANSETRON 4 MG PO TBDP
4.0000 mg | ORAL_TABLET | Freq: Three times a day (TID) | ORAL | 0 refills | Status: DC | PRN
Start: 2020-01-15 — End: 2020-01-18

## 2020-01-15 MED ORDER — PROMETHAZINE-DM 6.25-15 MG/5ML PO SYRP
5.0000 mL | ORAL_SOLUTION | Freq: Four times a day (QID) | ORAL | 0 refills | Status: DC | PRN
Start: 2020-01-15 — End: 2020-12-30

## 2020-01-15 MED ORDER — PROMETHAZINE HCL 25 MG PO TABS: ORAL_TABLET | ORAL | 0 refills | Status: DC

## 2020-01-15 NOTE — Progress Notes (Signed)
Virtual Visit via Video Note  I connected with Rachel [name] on 01/15/20 at  3:00 PM EDT by a video enabled telemedicine application and verified that I am speaking with the correct person using two identifiers.  The patient and the provider were at separate locations throughout the entire encounter. Patient location: home, Provider location: work   I discussed the limitations of evaluation and management by telemedicine and the availability of in person appointments. The patient expressed understanding and agreed to proceed. The patient and the provider were the only parties present for the visit unless noted in HPI below.  History of Present Illness: The patient is a 54 y.o. female with visit for headache, vomiting, body aches. She is coughing a lot especially at night time. Some congestion in throat and head. Back hurting a lot. Tried mucinex as advised by health at work. Started 01/10/20 and tested positive 01/11/20. Has been feeling worse since that time instead of better. She is diabetic and is concerned about her health. Denies SOB but coughing a lot and this does sometimes make her feel she cannot breathe. Overall it is worsening.   Observations/Objective: Appearance: appears sick, breathing appears normal, coughing during visit, no dyspnea, casual grooming, abdomen does not appear distended, throat not well visualized, memory normal, mental status is A and O times 3  Assessment and Plan: See problem oriented charting  Follow Up Instructions: refer to infusion center for monoclonal antibody, rx promethazine for nausea and cough medicine and zofran odt if she cannot stop vomiting to swallow promethazine  I discussed the assessment and treatment plan with the patient. The patient was provided an opportunity to ask questions and all were answered. The patient agreed with the plan and demonstrated an understanding of the instructions.   The patient was advised to call back or seek an  in-person evaluation if the symptoms worsen or if the condition fails to improve as anticipated.  Hoyt Koch, MD

## 2020-01-15 NOTE — Progress Notes (Signed)
I connected by phone with Rachel [name] on 01/15/2020 at 8:04 PM to discuss the potential use of a new treatment for mild to moderate COVID-19 viral infection in non-hospitalized patients.  This patient is a 54 y.o. female that meets the FDA criteria for Emergency Use Authorization of COVID monoclonal antibody casirivimab/imdevimab.  Has a (+) direct SARS-CoV-2 viral test result  Has mild or moderate COVID-19   Is NOT hospitalized due to COVID-19  Is within 10 days of symptom onset  Has at least one of the high risk factor(s) for progression to severe COVID-19 and/or hospitalization as defined in EUA.  Specific high risk criteria : Chronic Lung Disease, diabetes   I have spoken and communicated the following to the patient or parent/caregiver regarding COVID monoclonal antibody treatment:  1. FDA has authorized the emergency use for the treatment of mild to moderate COVID-19 in adults and pediatric patients with positive results of direct SARS-CoV-2 viral testing who are 105 years of age and older weighing at least 40 kg, and who are at high risk for progressing to severe COVID-19 and/or hospitalization.  2. The significant known and potential risks and benefits of COVID monoclonal antibody, and the extent to which such potential risks and benefits are unknown.  3. Information on available alternative treatments and the risks and benefits of those alternatives, including clinical trials.  4. Patients treated with COVID monoclonal antibody should continue to self-isolate and use infection control measures (e.g., wear mask, isolate, social distance, avoid sharing personal items, clean and disinfect "high touch" surfaces, and frequent handwashing) according to CDC guidelines.   5. The patient or parent/caregiver has the option to accept or refuse COVID monoclonal antibody treatment.  After reviewing this information with the patient, The patient agreed to proceed with receiving  casirivimab\imdevimab infusion and will be provided a copy of the Fact sheet prior to receiving the infusion. [name] 01/15/2020 8:04 PM

## 2020-01-16 ENCOUNTER — Encounter: Payer: Self-pay | Admitting: Internal Medicine

## 2020-01-16 DIAGNOSIS — U071 COVID-19: Secondary | ICD-10-CM | POA: Insufficient documentation

## 2020-01-16 MED ORDER — SODIUM CHLORIDE 0.9 % IV SOLN
Freq: Once | INTRAVENOUS | Status: AC
  Filled 2020-01-16: qty 600

## 2020-01-16 NOTE — Assessment & Plan Note (Signed)
Given co-morbidities qualifies for monoclonal antibody which she is interested in getting. Rx for symptoms today include zofran, phenergan for nausea and cough medicine.

## 2020-01-17 ENCOUNTER — Ambulatory Visit (HOSPITAL_COMMUNITY)
Admission: RE | Admit: 2020-01-17 | Discharge: 2020-01-17 | Disposition: A | Discharge status: Home / Self Care | Payer: 59 | Source: Ambulatory Visit | Attending: Pulmonary Disease | Admitting: Pulmonary Disease

## 2020-01-17 DIAGNOSIS — U071 COVID-19: Secondary | ICD-10-CM | POA: Insufficient documentation

## 2020-01-17 MED ORDER — EPINEPHRINE 0.3 MG/0.3ML IJ SOAJ: 0.3000 mg | Freq: Once | INTRAMUSCULAR | Status: DC | PRN

## 2020-01-17 MED ORDER — ALBUTEROL SULFATE HFA 108 (90 BASE) MCG/ACT IN AERS: 2.0000 | INHALATION_SPRAY | Freq: Once | RESPIRATORY_TRACT | Status: DC | PRN

## 2020-01-17 MED ORDER — METHYLPREDNISOLONE SODIUM SUCC 125 MG IJ SOLR: 125.0000 mg | Freq: Once | INTRAMUSCULAR | Status: DC | PRN

## 2020-01-17 MED ORDER — ACETAMINOPHEN 325 MG PO TABS
650.0000 mg | ORAL_TABLET | Freq: Four times a day (QID) | ORAL | Status: DC | PRN
  Filled 2020-01-17: qty 2

## 2020-01-17 MED ORDER — ONDANSETRON HCL 4 MG/2ML IJ SOLN
4.0000 mg | Freq: Once | INTRAMUSCULAR | Status: AC
  Administered 2020-01-17: 4 mg via INTRAVENOUS
  Filled 2020-01-17: qty 2

## 2020-01-17 MED ORDER — ACETAMINOPHEN 325 MG PO TABS
650.0000 mg | ORAL_TABLET | Freq: Four times a day (QID) | ORAL | Status: AC | PRN
  Administered 2020-01-17: 650 mg via ORAL

## 2020-01-17 MED ORDER — FAMOTIDINE IN NACL 20-0.9 MG/50ML-% IV SOLN: 20.0000 mg | Freq: Once | INTRAVENOUS | Status: DC | PRN

## 2020-01-17 MED ORDER — SODIUM CHLORIDE 0.9 % IV SOLN: INTRAVENOUS | Status: DC | PRN

## 2020-01-17 MED ORDER — DIPHENHYDRAMINE HCL 50 MG/ML IJ SOLN: 50.0000 mg | Freq: Once | INTRAMUSCULAR | Status: DC | PRN

## 2020-01-17 NOTE — Discharge Instructions (Signed)

## 2020-01-17 NOTE — Progress Notes (Addendum)
  Diagnosis: COVID-19  Physician: Dr. Asencion Noble  Procedure: [facility] Infusion Clinic Med: casirivimab\imdevimab infusion - Provided patient with casirivimab\imdevimab fact sheet for patients, parents and caregivers prior to infusion.  Complications: No immediate complications noted.  Discharge: Discharged home   Abran Cantor 01/17/2020

## 2020-01-18 ENCOUNTER — Encounter (HOSPITAL_COMMUNITY): Payer: Self-pay

## 2020-01-18 ENCOUNTER — Ambulatory Visit (INDEPENDENT_AMBULATORY_CARE_PROVIDER_SITE_OTHER): Payer: 59

## 2020-01-18 ENCOUNTER — Ambulatory Visit (HOSPITAL_COMMUNITY)
Admission: EM | Admit: 2020-01-18 | Discharge: 2020-01-18 | Disposition: A | Discharge status: Home / Self Care | Payer: 59 | Attending: Family Medicine | Admitting: Family Medicine

## 2020-01-18 ENCOUNTER — Other Ambulatory Visit: Payer: Self-pay

## 2020-01-18 DIAGNOSIS — U071 COVID-19: Secondary | ICD-10-CM

## 2020-01-18 DIAGNOSIS — R05 Cough: Secondary | ICD-10-CM

## 2020-01-18 DIAGNOSIS — J189 Pneumonia, unspecified organism: Secondary | ICD-10-CM

## 2020-01-18 MED ORDER — HYDROCODONE-HOMATROPINE 5-1.5 MG PO TABS: 1.0000 | ORAL_TABLET | Freq: Every evening | ORAL | 0 refills | Status: DC | PRN

## 2020-01-18 MED ORDER — DEXAMETHASONE SODIUM PHOSPHATE 10 MG/ML IJ SOLN
10.0000 mg | Freq: Once | INTRAMUSCULAR | Status: AC
  Administered 2020-01-18: 10 mg via INTRAMUSCULAR

## 2020-01-18 MED ORDER — ONDANSETRON 8 MG PO TBDP: 8.0000 mg | ORAL_TABLET | Freq: Three times a day (TID) | ORAL | 0 refills | Status: DC | PRN

## 2020-01-18 MED ORDER — BENZONATATE 200 MG PO CAPS: 200.0000 mg | ORAL_CAPSULE | Freq: Three times a day (TID) | ORAL | 0 refills | Status: AC | PRN

## 2020-01-18 MED ORDER — HYDROCODONE-HOMATROPINE 5-1.5 MG/5ML PO SYRP: 5.0000 mL | ORAL_SOLUTION | Freq: Every evening | ORAL | 0 refills | Status: DC | PRN

## 2020-01-18 MED ORDER — DEXAMETHASONE SODIUM PHOSPHATE 10 MG/ML IJ SOLN
INTRAMUSCULAR | Status: AC
  Filled 2020-01-18: qty 1

## 2020-01-18 NOTE — ED Provider Notes (Signed)
[facility]    CSN: [id number] Arrival date & time: 01/18/20  1214      History   Chief Complaint Chief Complaint  Patient presents with  . Fever  . Generalized Body Aches  . Cough    HPI Rachel [name] is a 54 y.o. female history of asthma, DM type II, hyperlipidemia, presenting today for evaluation of cough fatigue and vomiting.  Patient was diagnosed with Covid approximately 1 week ago.  She received infusion yesterday.  She reports significant nausea vomiting as well as diarrhea.  She reports continued fevers up to 103.  She has had very little energy.  Does have slight discomfort in her chest specially with coughing.  HPI  Past Medical History:  Diagnosis Date  . ALLERGIC RHINITIS 10/04/2007  . Anemia 01/21/2011  . Anxiety 11/15/2018  . ASTHMA 08/02/2007   INHALERS ONLY IN [place]  . Asthma 08/02/2007   Qualifier: Diagnosis of  By: Wynona Luna   . Blood transfusion without reported diagnosis    with first child   . Cervical radiculopathy 01/21/2011  . Cervicogenic headache 04/27/2017  . COMMON MIGRAINE 05/15/2009  . Depression 01/09/2010   Qualifier: Diagnosis of  By: Jenny Reichmann MD, Hunt Oris   . DIABETES MELLITUS, TYPE II 08/02/2007  . Dysphagia, pharyngoesophageal phase 06/12/2014  . GERD 08/02/2007  . HYPERLIPIDEMIA 08/02/2007  . Hyperlipidemia 08/02/2007   Qualifier: Diagnosis of  By: Wynona Luna   . INSOMNIA-SLEEP DISORDER-UNSPEC 01/04/2008  . INTERMITTENT VERTIGO 05/15/2009  . LIBIDO, DECREASED 01/09/2010  . Migraine without aura 05/15/2009   Qualifier: Diagnosis of  By: Jenny Reichmann MD, Hunt Oris   . Nonallopathic lesion of rib cage 12/21/2017  . Nonallopathic lesion of sacral region 07/29/2015  . Nonallopathic lesion of thoracic region 07/29/2015  . Osteoporosis 04/25/2019  . 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 Rheumatoid factor positive 02/10/2016  . Right knee pain 01/31/2019   Injected January 31, 2019  . Type 2 diabetes mellitus with  hyperglycemia, with long-term current use of insulin ([place]) 08/02/2007   Qualifier: Diagnosis of  By: Wynona Luna     Patient Active Problem List   Diagnosis Date Noted  . COVID-19 01/16/2020  . 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 Osteoporosis 04/25/2019  . Right knee pain 01/31/2019  . Anxiety 11/15/2018  . Nonallopathic lesion of rib cage 12/21/2017  . Cervicogenic headache 04/27/2017  . Nonallopathic lesion of cervical region 04/27/2017  . Rheumatoid factor positive 02/10/2016  . Nonallopathic lesion of lumbosacral region 07/29/2015  . Nonallopathic lesion of sacral region 07/29/2015  . Nonallopathic lesion of thoracic region 07/29/2015  . Dysphagia, pharyngoesophageal phase 06/12/2014  . Microcytic anemia 01/21/2011  . Cervical radiculopathy 01/21/2011  . Depression 01/09/2010  . Migraine without aura 05/15/2009  . INSOMNIA-SLEEP DISORDER-UNSPEC 01/04/2008  . Type 2 diabetes mellitus with hyperglycemia, with long-term current use of insulin ([place]) 08/02/2007  . Hyperlipidemia 08/02/2007  . Asthma 08/02/2007  . GERD 08/02/2007    Past Surgical History:  Procedure Laterality Date  . CESAREAN SECTION     x 3  . CYST EXCISION     left knee  . PAROTIDECTOMY  10/21/2011   Procedure: PAROTIDECTOMY;  Surgeon: Melida Quitter, MD;  Location: [facility];  Service: ENT;  Laterality: Left;    OB History   No obstetric history on file.      Home Medications    Prior to Admission medications   Medication Sig Start  Date End Date Taking? Authorizing Provider  aspirin 81 MG EC tablet Take 81 mg by mouth daily.      [provider]  atenolol (TENORMIN) 25 MG tablet Take 1 tablet (25 mg total) by mouth daily. X 7 days, then increase to 2 tabs (50mg ) daily Patient not taking: Reported on 10/15/2019 12/29/17   Pieter Partridge, DO  benzonatate (TESSALON) 200 MG capsule Take 1 capsule (200 mg total) by mouth 3 (three) times daily as needed for up to 7 days for cough.  01/18/20 01/25/20  Shandy Vi C, PA-C  Blood Glucose Monitoring Suppl (FREESTYLE LITE) DEVI Use as directed four times daily E11.9 11/14/18   Biagio Borg, MD  butalbital-acetaminophen-caffeine (FIORICET) [phone] MG tablet TAKE 1 TABLET BY MOUTH EVERY 6 HOURS AS NEEDED FOR HEADACHE (MAX OF 1 OR 2 TABLETS PER DAY) 10/16/19   Biagio Borg, MD  Cholecalciferol (VITAMIN D) 50 MCG (2000 UT) CAPS Take 4,000 Units by mouth daily.    [provider]  cyclobenzaprine (FLEXERIL) 5 MG tablet Take 1-2 tablets (5-10 mg total) by mouth 2 (two) times daily as needed for muscle spasms. 11/24/19   Tinsley Everman C, PA-C  Diclofenac Sodium (PENNSAID) 2 % SOLN Place 2 g onto the skin 2 (two) times daily. Patient not taking: Reported on 10/15/2019 09/05/19   Lyndal Pulley, DO  esomeprazole (NEXIUM) 40 MG capsule TAKE 1 CAPSULE BY MOUTH 2 TIMES DAILY BEFORE A MEAL. Patient taking differently: Take 40 mg by mouth 2 (two) times daily before a meal.  01/25/19   Biagio Borg, MD  ferrous sulfate 324 MG TBEC Take 324 mg by mouth daily.    [provider]  fexofenadine (ALLEGRA) 180 MG tablet Take 1 tablet (180 mg total) by mouth daily. Patient not taking: Reported on 10/15/2019 10/04/19 10/03/20  Biagio Borg, MD  Fremanezumab-vfrm (AJOVY) 225 MG/1.5ML SOAJ Inject 225 mg into the skin every 30 (thirty) days. 08/29/19   Jaffe, Adam R, DO  gabapentin (NEURONTIN) 300 MG capsule TAKE 1 CAPSULE BY MOUTH 2 TIMES DAILY. Patient taking differently: Take 300 mg by mouth in the morning and at bedtime. . 08/29/19   Pieter Partridge, DO  glucose blood (FREESTYLE LITE) test strip Use as instructed four times daily E11.9 11/14/18   Biagio Borg, MD  HYDROcodone-Homatropine 5-1.5 MG TABS Take 1 tablet by mouth at bedtime as needed (cough). 01/18/20   Bodey Frizell C, PA-C  Insulin Glargine (BASAGLAR KWIKPEN) 100 UNIT/ML Inject 0.34 mLs (34 Units total) into the skin daily. 11/09/19   Biagio Borg, MD  insulin lispro (HUMALOG  KWIKPEN) 100 UNIT/ML KwikPen Inject 0.05-0.08 mLs (5-8 Units total) into the skin 3 (three) times daily. Patient taking differently: Inject 4-8 Units into the skin 3 (three) times daily. Sliding scale : if blood sugar 130 - takes 4 units . If over 150 takes 8 units. 09/05/19   Philemon Kingdom, MD  Lancets (FREESTYLE) lancets Use as instructed four times daily E11.9 11/14/18   Biagio Borg, MD  Lasmiditan Succinate (REYVOW) 100 MG TABS Take 100 mg by mouth as needed. Take 1 tablet for headache. No more than 1 tablet in 24 hours 09/12/18   Pieter Partridge, DO  linaclotide (LINZESS) 145 MCG CAPS capsule Take 1 capsule (145 mcg total) by mouth daily before breakfast. Patient taking differently: Take 145 mcg by mouth daily as needed (for irritable bowel movement; pt states taking weekly).  11/14/18   Jenny Reichmann,  Hunt Oris, MD  metoCLOPramide (REGLAN) 5 MG tablet Take 1 tablet (5 mg total) by mouth every 8 (eight) hours as needed for nausea or vomiting. 01/24/19   Biagio Borg, MD  Multiple Vitamin (MULTIVITAMIN WITH MINERALS) TABS tablet Take 1 tablet by mouth daily.    [provider]  naproxen (NAPROSYN) 500 MG tablet Take 1 tablet (500 mg total) by mouth 2 (two) times daily. 11/24/19   Aamirah Salmi C, PA-C  ondansetron (ZOFRAN ODT) 8 MG disintegrating tablet Take 1 tablet (8 mg total) by mouth every 8 (eight) hours as needed for nausea or vomiting. 01/18/20   Selby Slovacek C, PA-C  promethazine (PHENERGAN) 25 MG tablet TAKE 1 TABLET BY MOUTH EVERY 4-6 HOURS AS NEEDED 01/15/20   Hoyt Koch, MD  promethazine-dextromethorphan (PROMETHAZINE-DM) 6.25-15 MG/5ML syrup Take 5 mLs by mouth 4 (four) times daily as needed for cough. 01/15/20   Hoyt Koch, MD  rosuvastatin (CRESTOR) 40 MG tablet TAKE 1 TABLET (40 MG TOTAL) BY MOUTH DAILY. 08/30/18   Biagio Borg, MD  Semaglutide,0.25 or 0.5MG /DOS, (OZEMPIC, 0.25 OR 0.5 MG/DOSE,) 2 MG/1.5ML SOPN Inject 0.5 mg into the skin once a week. 07/02/19    Philemon Kingdom, MD  traMADol (ULTRAM) 50 MG tablet TAKE 1 TABLET BY MOUTH EVERY 8 HOURS AS NEEDED Patient taking differently: Take 50 mg by mouth 3 (three) times daily as needed for severe pain.  04/04/19   Biagio Borg, MD  triamcinolone (NASACORT) 55 MCG/ACT AERO nasal inhaler Place 2 sprays into the nose daily. Patient not taking: Reported on 10/15/2019 10/04/19   Biagio Borg, MD  UNIFINE PENTIPS 32G X 4 MM MISC USE 4 TIMES A DAY 11/09/19   Philemon Kingdom, MD    Family History Family History  Problem Relation Age of Onset  . Hypertension Father   . Diabetes Father   . Asthma Father   . Cervical cancer Maternal Aunt   . Heart disease Maternal Aunt   . Anesthesia problems Neg Hx   . Colon cancer Neg Hx   . Breast cancer Neg Hx     Social History Social History   Tobacco Use  . Smoking status: Never Smoker  . Smokeless tobacco: Never Used  Vaping Use  . Vaping Use: Never used  Substance Use Topics  . Alcohol use: No    Alcohol/week: 0.0 standard drinks  . Drug use: No     Allergies   Lipitor [atorvastatin] and Nitroglycerin   Review of Systems Review of Systems  Constitutional: Positive for appetite change, chills, fatigue and fever. Negative for activity change.  HENT: Positive for congestion. Negative for ear pain, rhinorrhea, sinus pressure, sore throat and trouble swallowing.   Eyes: Negative for discharge and redness.  Respiratory: Positive for cough, chest tightness and shortness of breath.   Cardiovascular: Negative for chest pain.  Gastrointestinal: Positive for diarrhea, nausea and vomiting. Negative for abdominal pain.  Musculoskeletal: Negative for myalgias.  Skin: Negative for rash.  Neurological: Positive for headaches. Negative for dizziness and light-headedness.     Physical Exam Triage Vital Signs ED Triage Vitals  Enc Vitals Group     BP      Pulse      Resp      Temp      Temp src      SpO2      Weight      Height      Head  Circumference  Peak Flow      Pain Score      Pain Loc      Pain Edu?      Excl. in [place]?    No data found.  Updated Vital Signs BP 115/85   Pulse (!) 106   Temp 99.2 F (37.3 C)   Resp 16   SpO2 97%   Visual Acuity Right Eye Distance:   Left Eye Distance:   Bilateral Distance:    Right Eye Near:   Left Eye Near:    Bilateral Near:     Physical Exam Vitals and nursing note reviewed.  Constitutional:      Appearance: She is well-developed.     Comments: No acute distress  HENT:     Head: Normocephalic and atraumatic.     Ears:     Comments: Bilateral ears without tenderness to palpation of external auricle, tragus and mastoid, EAC's without erythema or swelling, TM's with good bony landmarks and cone of light. Non erythematous.     Nose: Nose normal.     Mouth/Throat:     Comments: Oral mucosa pink and moist, no tonsillar enlargement or exudate. Posterior pharynx patent and nonerythematous, no uvula deviation or swelling. Normal phonation. Eyes:     Conjunctiva/sclera: Conjunctivae normal.  Cardiovascular:     Rate and Rhythm: Tachycardia present.  Pulmonary:     Effort: Pulmonary effort is normal. No respiratory distress.     Comments: Breathing comfortably at rest, CTABL, no wheezing, rales or other adventitious sounds auscultated Abdominal:     General: There is no distension.  Musculoskeletal:        General: Normal range of motion.     Cervical back: Neck supple.  Skin:    General: Skin is warm and dry.  Neurological:     Mental Status: She is alert and oriented to person, place, and time.      UC Treatments / Results  Labs (all labs ordered are listed, but only abnormal results are displayed) Labs Reviewed - No data to display  EKG   Radiology DG Chest 2 View  Result Date: 01/18/2020 CLINICAL DATA:  COVID positive.  Cough. EXAM: CHEST - 2 VIEW COMPARISON:  10/15/2019. FINDINGS: Mediastinum hilar structures normal. Heart size normal.  Bilateral increase interstitial prominence suggesting pneumonitis. Mild atelectatic changes right lung base. No pleural effusion or pneumothorax. IMPRESSION: Bilateral increase interstitial prominence suggesting pneumonitis in this known COVID positive patient. Mild atelectatic changes right lung base. Electronically Signed   By: Marcello Moores  Register   On: 01/18/2020 14:10    Procedures Procedures (including critical care time)  Medications Ordered in UC Medications  dexamethasone (DECADRON) injection 10 mg (has no administration in time range)    Initial Impression / Assessment and Plan / UC Course  I have reviewed the triage vital signs and the nursing notes.  Pertinent labs & imaging results that were available during my care of the patient were reviewed by me and considered in my medical decision making (see chart for details).     X-ray negative for pneumonia, does suggest pneumonitis, likely secondary to COVID-19.  Will provide one-time dose of Decadron IM, does have diabetes, last A1c 10, full benefit greater than risk in setting of COVID-19.  Providing further symptomatic and supportive care for nausea with Zofran, Tessalon for daytime cough, Hycodan for nighttime cough.  Rest and fluids.  Imodium/Pepto-Bismol as needed for diarrhea.  Monitor for gradual improvement.  If symptoms not improving or worsening despite  the addition of above and time, patient to follow-up here or emergency room.Discussed strict return precautions. Patient verbalized understanding and is agreeable with plan.  Final Clinical Impressions(s) / UC Diagnoses   Final diagnoses:  [id number] virus infection  Pneumonitis     Discharge Instructions     We gave you a dose of Decadron today to help with cough and inflammation in lungs from Covid Please continue to use Zofran as needed for nausea, have sent in a milligram tablets 3 days instead of 4 mg tabs Over-the-counter Imodium/Pepto-Bismol for  diarrhea Benzonatate/Tessalon every 8 hours as needed for cough May use hydrocodone/homatropine at bedtime for nighttime cough Rest and fluids Please follow-up if symptoms not improving or worsening despite addition of the above    ED Prescriptions    Medication Sig Dispense Auth. Provider   ondansetron (ZOFRAN ODT) 8 MG disintegrating tablet Take 1 tablet (8 mg total) by mouth every 8 (eight) hours as needed for nausea or vomiting. 30 tablet Jyla Hopf C, PA-C   benzonatate (TESSALON) 200 MG capsule Take 1 capsule (200 mg total) by mouth 3 (three) times daily as needed for up to 7 days for cough. 28 capsule Tinna Kolker C, PA-C   HYDROcodone-Homatropine 5-1.5 MG TABS Take 1 tablet by mouth at bedtime as needed (cough). 8 tablet Jcion Buddenhagen, [place] C, PA-C     I have reviewed the PDMP during this encounter.   Janith Lima, PA-C 01/18/20 1429

## 2020-01-18 NOTE — Discharge Instructions (Addendum)
We gave you a dose of Decadron today to help with cough and inflammation in lungs from Covid Please continue to use Zofran as needed for nausea, have sent in a milligram tablets 3 days instead of 4 mg tabs Over-the-counter Imodium/Pepto-Bismol for diarrhea Benzonatate/Tessalon every 8 hours as needed for cough May use hydrocodone/homatropine at bedtime for nighttime cough Rest and fluids Please follow-up if symptoms not improving or worsening despite addition of the above

## 2020-01-18 NOTE — ED Triage Notes (Addendum)
Pt diagnosed with COVID 7 days ago, c/o cough, fever, body aches and headache. Requesting chest xray.  Pt had infusion yesterday, reports having diarrhea all night after

## 2020-01-21 ENCOUNTER — Emergency Department (HOSPITAL_COMMUNITY)
Admission: EM | Admit: 2020-01-21 | Discharge: 2020-01-21 | Disposition: A | Discharge status: Home / Self Care | Payer: 59 | Attending: Emergency Medicine | Admitting: Emergency Medicine

## 2020-01-21 ENCOUNTER — Encounter (HOSPITAL_COMMUNITY): Payer: Self-pay | Admitting: Emergency Medicine

## 2020-01-21 ENCOUNTER — Encounter (HOSPITAL_COMMUNITY): Payer: Self-pay

## 2020-01-21 ENCOUNTER — Telehealth: Payer: Self-pay

## 2020-01-21 ENCOUNTER — Ambulatory Visit (INDEPENDENT_AMBULATORY_CARE_PROVIDER_SITE_OTHER)
Admission: EM | Admit: 2020-01-21 | Discharge: 2020-01-21 | Disposition: A | Discharge status: Home / Self Care | Payer: 59 | Source: Home / Self Care

## 2020-01-21 ENCOUNTER — Telehealth: Payer: 59 | Admitting: Family

## 2020-01-21 ENCOUNTER — Other Ambulatory Visit: Payer: Self-pay

## 2020-01-21 DIAGNOSIS — R509 Fever, unspecified: Secondary | ICD-10-CM

## 2020-01-21 DIAGNOSIS — R824 Acetonuria: Secondary | ICD-10-CM | POA: Insufficient documentation

## 2020-01-21 DIAGNOSIS — R197 Diarrhea, unspecified: Secondary | ICD-10-CM | POA: Insufficient documentation

## 2020-01-21 DIAGNOSIS — R1110 Vomiting, unspecified: Secondary | ICD-10-CM | POA: Diagnosis not present

## 2020-01-21 DIAGNOSIS — J45909 Unspecified asthma, uncomplicated: Secondary | ICD-10-CM | POA: Insufficient documentation

## 2020-01-21 DIAGNOSIS — U071 COVID-19: Secondary | ICD-10-CM | POA: Insufficient documentation

## 2020-01-21 DIAGNOSIS — E1165 Type 2 diabetes mellitus with hyperglycemia: Secondary | ICD-10-CM | POA: Insufficient documentation

## 2020-01-21 DIAGNOSIS — R109 Unspecified abdominal pain: Secondary | ICD-10-CM | POA: Diagnosis not present

## 2020-01-21 DIAGNOSIS — Z794 Long term (current) use of insulin: Secondary | ICD-10-CM

## 2020-01-21 DIAGNOSIS — R071 Chest pain on breathing: Secondary | ICD-10-CM | POA: Diagnosis not present

## 2020-01-21 DIAGNOSIS — R112 Nausea with vomiting, unspecified: Secondary | ICD-10-CM | POA: Insufficient documentation

## 2020-01-21 DIAGNOSIS — R519 Headache, unspecified: Secondary | ICD-10-CM | POA: Diagnosis not present

## 2020-01-21 DIAGNOSIS — R05 Cough: Secondary | ICD-10-CM | POA: Diagnosis present

## 2020-01-21 DIAGNOSIS — R42 Dizziness and giddiness: Secondary | ICD-10-CM | POA: Diagnosis not present

## 2020-01-21 LAB — CBC WITH DIFFERENTIAL/PLATELET
Abs Immature Granulocytes: 0.14 10*3/uL — ABNORMAL HIGH (ref 0.00–0.07)
Basophils Absolute: 0 10*3/uL (ref 0.0–0.1)
Basophils Relative: 0 %
Eosinophils Absolute: 0 10*3/uL (ref 0.0–0.5)
Eosinophils Relative: 0 %
HCT: 35.7 % — ABNORMAL LOW (ref 36.0–46.0)
Hemoglobin: 11.3 g/dL — ABNORMAL LOW (ref 12.0–15.0)
Immature Granulocytes: 1 %
Lymphocytes Relative: 31 %
Lymphs Abs: 3.2 10*3/uL (ref 0.7–4.0)
MCH: 26 pg (ref 26.0–34.0)
MCHC: 31.7 g/dL (ref 30.0–36.0)
MCV: 82.3 fL (ref 80.0–100.0)
Monocytes Absolute: 0.9 10*3/uL (ref 0.1–1.0)
Monocytes Relative: 9 %
Neutro Abs: 6.1 10*3/uL (ref 1.7–7.7)
Neutrophils Relative %: 59 %
Platelets: 533 10*3/uL — ABNORMAL HIGH (ref 150–400)
RBC: 4.34 MIL/uL (ref 3.87–5.11)
RDW: 12.7 % (ref 11.5–15.5)
WBC: 10.4 10*3/uL (ref 4.0–10.5)
nRBC: 0 % (ref 0.0–0.2)

## 2020-01-21 LAB — COMPREHENSIVE METABOLIC PANEL
ALT: 34 U/L (ref 0–44)
AST: 23 U/L (ref 15–41)
Albumin: 3.5 g/dL (ref 3.5–5.0)
Alkaline Phosphatase: 65 U/L (ref 38–126)
Anion gap: 16 — ABNORMAL HIGH (ref 5–15)
BUN: 6 mg/dL (ref 6–20)
CO2: 23 mmol/L (ref 22–32)
Calcium: 9 mg/dL (ref 8.9–10.3)
Chloride: 97 mmol/L — ABNORMAL LOW (ref 98–111)
Creatinine, Ser: 0.67 mg/dL (ref 0.44–1.00)
GFR calc Af Amer: >60 mL/min (ref >60)
GFR calc non Af Amer: >60 mL/min (ref >60)
Glucose, Bld: 237 mg/dL — ABNORMAL HIGH (ref 70–99)
Potassium: 3.5 mmol/L (ref 3.5–5.1)
Sodium: 136 mmol/L (ref 135–145)
Total Bilirubin: 0.9 mg/dL (ref 0.3–1.2)
Total Protein: 7.3 g/dL (ref 6.5–8.1)

## 2020-01-21 LAB — CBC
HCT: 39.7 % (ref 36.0–46.0)
Hemoglobin: 12.8 g/dL (ref 12.0–15.0)
MCH: 26 pg (ref 26.0–34.0)
MCHC: 32.2 g/dL (ref 30.0–36.0)
MCV: 80.7 fL (ref 80.0–100.0)
Platelets: 564 10*3/uL — ABNORMAL HIGH (ref 150–400)
RBC: 4.92 MIL/uL (ref 3.87–5.11)
RDW: 12.6 % (ref 11.5–15.5)
WBC: 10.1 10*3/uL (ref 4.0–10.5)
nRBC: 0 % (ref 0.0–0.2)

## 2020-01-21 LAB — POCT URINALYSIS DIPSTICK, ED / UC
Glucose, UA: 500 mg/dL — AB
Hgb urine dipstick: NEGATIVE
Ketones, ur: >=160 mg/dL — AB
Leukocytes,Ua: NEGATIVE
Nitrite: NEGATIVE
Protein, ur: 30 mg/dL — AB
Specific Gravity, Urine: 1.02 (ref 1.005–1.030)
Urobilinogen, UA: 1 mg/dL (ref 0.0–1.0)
pH: 7 (ref 5.0–8.0)

## 2020-01-21 LAB — CBG MONITORING, ED
Glucose-Capillary: 244 mg/dL — ABNORMAL HIGH (ref 70–99)
Glucose-Capillary: 216 mg/dL — ABNORMAL HIGH (ref 70–99)

## 2020-01-21 MED ORDER — KETOROLAC TROMETHAMINE 15 MG/ML IJ SOLN
15.0000 mg | Freq: Once | INTRAMUSCULAR | Status: AC
  Administered 2020-01-21: 15 mg via INTRAVENOUS
  Filled 2020-01-21: qty 1

## 2020-01-21 MED ORDER — METOCLOPRAMIDE HCL 5 MG/ML IJ SOLN
10.0000 mg | Freq: Once | INTRAMUSCULAR | Status: AC
  Administered 2020-01-21: 10 mg via INTRAVENOUS
  Filled 2020-01-21: qty 2

## 2020-01-21 MED ORDER — LACTATED RINGERS IV BOLUS
2000.0000 mL | Freq: Once | INTRAVENOUS | Status: AC
  Administered 2020-01-21: 2000 mL via INTRAVENOUS

## 2020-01-21 MED ORDER — HYDROCODONE-HOMATROPINE 5-1.5 MG/5ML PO SYRP: 5.0000 mL | ORAL_SOLUTION | Freq: Every evening | ORAL | 0 refills | Status: DC | PRN

## 2020-01-21 MED ORDER — SODIUM CHLORIDE 0.9 % IV BOLUS
1000.0000 mL | Freq: Once | INTRAVENOUS | Status: AC
  Administered 2020-01-21: 1000 mL via INTRAVENOUS

## 2020-01-21 NOTE — Telephone Encounter (Signed)
Pt left a VM on triage line due to access nurse giving pt number to triage line. No virtual clinic at this clinic this weekend. Pt was seen at [facility] and then at ER for positive covid.  Forwarding msg to PCP.

## 2020-01-21 NOTE — ED Triage Notes (Signed)
Pt sent here from uc to further evaluation. Pt continues to have nausea, vomiting and fever and was diagnose with Covid on 7/29.

## 2020-01-21 NOTE — Discharge Instructions (Addendum)
Given your symptoms of COVID, blood sugars and findings today, you need further evaluation in the Emergency Department. Our staff will escort you there. It is very important you stay to be evaluated by a provider and complete the work up

## 2020-01-21 NOTE — ED Triage Notes (Signed)
Pt states she tested positive for COVID on 01/10/2020 through [facility]. Pt c/o fever with Tmax 103 this morning, n/v/d for approx 1 week., cough, SOB, dizziness with HA, also reports left upper CP when coughing.   Denies sore throat.  Pt states she vomited this morning, then took zofran, phenergan and no emesis since taking Rx.  Denies tylenol/motrin use. Afebrile in clinic w/o use of anti-pyretics. Pt states she has not taken her insulin in two days; pt took metformin yesterday; although Rx was d/c for pt. Pt reports she last checked her blood glucose yesterday and it was 254. Bilateral lung sounds CTA

## 2020-01-21 NOTE — Telephone Encounter (Signed)
Cough med done erx 

## 2020-01-21 NOTE — ED Provider Notes (Signed)
[facility]    CSN: [id number] Arrival date & time: 01/21/20  0831      History   Chief Complaint Chief Complaint  Patient presents with  . Fever  . Emesis    HPI Rachel [name] is a 54 y.o. female.   Patient with known COVID-19 presents for persistent symptoms, of nausea, vomiting, abdominal pain, headache and dizziness.  She endorses left upper chest pain as well.  Left upper chest pain is worse with cough and deep inspiration.  This chest pain is also felt in the left upper back.  No pain at rest only with cough or deep breath.  Cough is remained nonproductive.  She reports she is also having shortness of breath with ambulation.  She reports she has had vomiting with ambulation and some dizziness.  She reports she has only been drinking" 2 small Gatorade's" daily and has not been tolerating water.  She reports episodes of vomiting abdominal pain this morning.  No reported blood in the vomit.  She has not been using her insulin as prescribed has been taking Metformin which she was no longer supposed to be taking for sugar.  Glucose is in the 200s.  She reports leg pains with walking cramping.  She was seen most recently on 01/18/2020 for similar symptoms and treated symptomatically.  X-ray at the time showed suspicion of pneumonitis.  Patient reports she is feeling worse and is definitely not improved.  She has been taking the medicines as prescribed.       Past Medical History:  Diagnosis Date  . ALLERGIC RHINITIS 10/04/2007  . Anemia 01/21/2011  . Anxiety 11/15/2018  . ASTHMA 08/02/2007   INHALERS ONLY IN [place]  . Asthma 08/02/2007   Qualifier: Diagnosis of  By: Wynona Luna   . Blood transfusion without reported diagnosis    with first child   . Cervical radiculopathy 01/21/2011  . Cervicogenic headache 04/27/2017  . COMMON MIGRAINE 05/15/2009  . Depression 01/09/2010   Qualifier: Diagnosis of  By: Jenny Reichmann MD, Hunt Oris   . DIABETES MELLITUS, TYPE II 08/02/2007  .  Dysphagia, pharyngoesophageal phase 06/12/2014  . GERD 08/02/2007  . HYPERLIPIDEMIA 08/02/2007  . Hyperlipidemia 08/02/2007   Qualifier: Diagnosis of  By: Wynona Luna   . INSOMNIA-SLEEP DISORDER-UNSPEC 01/04/2008  . INTERMITTENT VERTIGO 05/15/2009  . LIBIDO, DECREASED 01/09/2010  . Migraine without aura 05/15/2009   Qualifier: Diagnosis of  By: Jenny Reichmann MD, Hunt Oris   . Nonallopathic lesion of rib cage 12/21/2017  . Nonallopathic lesion of sacral region 07/29/2015  . Nonallopathic lesion of thoracic region 07/29/2015  . Osteoporosis 04/25/2019  . 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 Rheumatoid factor positive 02/10/2016  . Right knee pain 01/31/2019   Injected January 31, 2019  . Type 2 diabetes mellitus with hyperglycemia, with long-term current use of insulin ([place]) 08/02/2007   Qualifier: Diagnosis of  By: Wynona Luna     Patient Active Problem List   Diagnosis Date Noted  . COVID-19 01/16/2020  . 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 Osteoporosis 04/25/2019  . Right knee pain 01/31/2019  . Anxiety 11/15/2018  . Nonallopathic lesion of rib cage 12/21/2017  . Cervicogenic headache 04/27/2017  . Nonallopathic lesion of cervical region 04/27/2017  . Rheumatoid factor positive 02/10/2016  . Nonallopathic lesion of lumbosacral region 07/29/2015  . Nonallopathic lesion of sacral region 07/29/2015  . Nonallopathic lesion of thoracic region 07/29/2015  . Dysphagia, pharyngoesophageal  phase 06/12/2014  . Microcytic anemia 01/21/2011  . Cervical radiculopathy 01/21/2011  . Depression 01/09/2010  . Migraine without aura 05/15/2009  . INSOMNIA-SLEEP DISORDER-UNSPEC 01/04/2008  . Type 2 diabetes mellitus with hyperglycemia, with long-term current use of insulin ([place]) 08/02/2007  . Hyperlipidemia 08/02/2007  . Asthma 08/02/2007  . GERD 08/02/2007    Past Surgical History:  Procedure Laterality Date  . CESAREAN SECTION     x 3  . CYST EXCISION     left  knee  . PAROTIDECTOMY  10/21/2011   Procedure: PAROTIDECTOMY;  Surgeon: Melida Quitter, MD;  Location: [facility];  Service: ENT;  Laterality: Left;    OB History   No obstetric history on file.      Home Medications    Prior to Admission medications   Medication Sig Start Date End Date Taking? Authorizing Provider  aspirin 81 MG EC tablet Take 81 mg by mouth daily.     Yes [provider]  Cholecalciferol (VITAMIN D) 50 MCG (2000 UT) CAPS Take 4,000 Units by mouth daily.   Yes [provider]  esomeprazole (NEXIUM) 40 MG capsule TAKE 1 CAPSULE BY MOUTH 2 TIMES DAILY BEFORE A MEAL. Patient taking differently: Take 40 mg by mouth 2 (two) times daily before a meal.  01/25/19  Yes Biagio Borg, MD  Fremanezumab-vfrm (AJOVY) 225 MG/1.5ML SOAJ Inject 225 mg into the skin every 30 (thirty) days. 08/29/19  Yes Jaffe, Adam R, DO  gabapentin (NEURONTIN) 300 MG capsule TAKE 1 CAPSULE BY MOUTH 2 TIMES DAILY. Patient taking differently: Take 300 mg by mouth in the morning and at bedtime. . 08/29/19  Yes Tomi Likens, Adam R, DO  Insulin Glargine (BASAGLAR KWIKPEN) 100 UNIT/ML Inject 0.34 mLs (34 Units total) into the skin daily. 11/09/19  Yes Biagio Borg, MD  promethazine (PHENERGAN) 25 MG tablet TAKE 1 TABLET BY MOUTH EVERY 4-6 HOURS AS NEEDED 01/15/20  Yes Hoyt Koch, MD  rosuvastatin (CRESTOR) 40 MG tablet TAKE 1 TABLET (40 MG TOTAL) BY MOUTH DAILY. 08/30/18  Yes Biagio Borg, MD  Semaglutide,0.25 or 0.5MG /DOS, (OZEMPIC, 0.25 OR 0.5 MG/DOSE,) 2 MG/1.5ML SOPN Inject 0.5 mg into the skin once a week. 07/02/19  Yes Philemon Kingdom, MD  atenolol (TENORMIN) 25 MG tablet Take 1 tablet (25 mg total) by mouth daily. X 7 days, then increase to 2 tabs (50mg ) daily Patient not taking: Reported on 10/15/2019 12/29/17   Pieter Partridge, DO  benzonatate (TESSALON) 200 MG capsule Take 1 capsule (200 mg total) by mouth 3 (three) times daily as needed for up to 7 days for cough. 01/18/20 01/25/20  Wieters,  Hallie C, PA-C  Blood Glucose Monitoring Suppl (FREESTYLE LITE) DEVI Use as directed four times daily E11.9 11/14/18   Biagio Borg, MD  butalbital-acetaminophen-caffeine (FIORICET) [phone] MG tablet TAKE 1 TABLET BY MOUTH EVERY 6 HOURS AS NEEDED FOR HEADACHE (MAX OF 1 OR 2 TABLETS PER DAY) 10/16/19   Biagio Borg, MD  cyclobenzaprine (FLEXERIL) 5 MG tablet Take 1-2 tablets (5-10 mg total) by mouth 2 (two) times daily as needed for muscle spasms. 11/24/19   Wieters, Hallie C, PA-C  Diclofenac Sodium (PENNSAID) 2 % SOLN Place 2 g onto the skin 2 (two) times daily. Patient not taking: Reported on 10/15/2019 09/05/19   Lyndal Pulley, DO  ferrous sulfate 324 MG TBEC Take 324 mg by mouth daily.    [provider]  fexofenadine (ALLEGRA) 180 MG tablet Take 1 tablet (180 mg  total) by mouth daily. Patient not taking: Reported on 10/15/2019 10/04/19 10/03/20  Biagio Borg, MD  glucose blood (FREESTYLE LITE) test strip Use as instructed four times daily E11.9 11/14/18   Biagio Borg, MD  HYDROcodone-homatropine [facility]) 5-1.5 MG/5ML syrup Take 5 mLs by mouth at bedtime as needed for cough. 01/18/20   Wieters, Hallie C, PA-C  insulin lispro (HUMALOG KWIKPEN) 100 UNIT/ML KwikPen Inject 0.05-0.08 mLs (5-8 Units total) into the skin 3 (three) times daily. Patient taking differently: Inject 4-8 Units into the skin 3 (three) times daily. Sliding scale : if blood sugar 130 - takes 4 units . If over 150 takes 8 units. 09/05/19   Philemon Kingdom, MD  Lancets (FREESTYLE) lancets Use as instructed four times daily E11.9 11/14/18   Biagio Borg, MD  Lasmiditan Succinate (REYVOW) 100 MG TABS Take 100 mg by mouth as needed. Take 1 tablet for headache. No more than 1 tablet in 24 hours 09/12/18   Pieter Partridge, DO  linaclotide (LINZESS) 145 MCG CAPS capsule Take 1 capsule (145 mcg total) by mouth daily before breakfast. Patient taking differently: Take 145 mcg by mouth daily as needed (for irritable bowel movement; pt  states taking weekly).  11/14/18   Biagio Borg, MD  metoCLOPramide (REGLAN) 5 MG tablet Take 1 tablet (5 mg total) by mouth every 8 (eight) hours as needed for nausea or vomiting. 01/24/19   Biagio Borg, MD  Multiple Vitamin (MULTIVITAMIN WITH MINERALS) TABS tablet Take 1 tablet by mouth daily.    [provider]  naproxen (NAPROSYN) 500 MG tablet Take 1 tablet (500 mg total) by mouth 2 (two) times daily. 11/24/19   Wieters, Hallie C, PA-C  ondansetron (ZOFRAN ODT) 8 MG disintegrating tablet Take 1 tablet (8 mg total) by mouth every 8 (eight) hours as needed for nausea or vomiting. 01/18/20   Wieters, Hallie C, PA-C  promethazine-dextromethorphan (PROMETHAZINE-DM) 6.25-15 MG/5ML syrup Take 5 mLs by mouth 4 (four) times daily as needed for cough. 01/15/20   Hoyt Koch, MD  traMADol (ULTRAM) 50 MG tablet TAKE 1 TABLET BY MOUTH EVERY 8 HOURS AS NEEDED Patient taking differently: Take 50 mg by mouth 3 (three) times daily as needed for severe pain.  04/04/19   Biagio Borg, MD  triamcinolone (NASACORT) 55 MCG/ACT AERO nasal inhaler Place 2 sprays into the nose daily. Patient not taking: Reported on 10/15/2019 10/04/19   Biagio Borg, MD  UNIFINE PENTIPS 32G X 4 MM MISC USE 4 TIMES A DAY 11/09/19   Philemon Kingdom, MD    Family History Family History  Problem Relation Age of Onset  . Hypertension Father   . Diabetes Father   . Asthma Father   . Cervical cancer Maternal Aunt   . Heart disease Maternal Aunt   . Anesthesia problems Neg Hx   . Colon cancer Neg Hx   . Breast cancer Neg Hx     Social History Social History   Tobacco Use  . Smoking status: Never Smoker  . Smokeless tobacco: Never Used  Vaping Use  . Vaping Use: Never used  Substance Use Topics  . Alcohol use: No    Alcohol/week: 0.0 standard drinks  . Drug use: No     Allergies   Lipitor [atorvastatin] and Nitroglycerin   Review of Systems Review of Systems   Physical Exam Triage Vital Signs ED  Triage Vitals  Enc Vitals Group     BP 01/21/20 0941 130/85  Pulse Rate 01/21/20 0941 (!) 115     Resp 01/21/20 0941 20     Temp 01/21/20 0941 98.9 F (37.2 C)     Temp Source 01/21/20 0941 Oral     SpO2 01/21/20 0941 98 %     Weight --      Height --      Head Circumference --      Peak Flow --      Pain Score 01/21/20 0942 8     Pain Loc --      Pain Edu? --      Excl. in [place]? --    Orthostatic VS for the past 24 hrs:  BP- Lying Pulse- Lying BP- Sitting Pulse- Sitting BP- Standing at 0 minutes Pulse- Standing at 0 minutes  01/21/20 1049 115/79 94 120/86 99 115/81 104    Updated Vital Signs BP 130/85 (BP Location: Right Arm)   Pulse (!) 115   Temp 98.9 F (37.2 C) (Oral)   Resp 20   SpO2 98%   Visual Acuity Right Eye Distance:   Left Eye Distance:   Bilateral Distance:    Right Eye Near:   Left Eye Near:    Bilateral Near:     Physical Exam Vitals and nursing note reviewed.  Constitutional:      General: She is not in acute distress.    Appearance: She is well-developed. She is ill-appearing.  HENT:     Head: Normocephalic and atraumatic.     Nose: Congestion present.     Mouth/Throat:     Mouth: Mucous membranes are dry.     Pharynx: Oropharynx is clear.  Cardiovascular:     Rate and Rhythm: Regular rhythm. Tachycardia present.     Heart sounds: No murmur heard.   Pulmonary:     Effort: Pulmonary effort is normal. No respiratory distress.     Breath sounds: Normal breath sounds.  Abdominal:     Palpations: Abdomen is soft.     Tenderness: There is abdominal tenderness (Epigastric).  Musculoskeletal:     Cervical back: Neck supple.     Right lower leg: No edema.     Left lower leg: No edema.  Skin:    General: Skin is warm and dry.     Coloration: Skin is not jaundiced.     Findings: No rash.  Neurological:     General: No focal deficit present.     Mental Status: She is alert and oriented to person, place, and time.      UC Treatments /  Results  Labs (all labs ordered are listed, but only abnormal results are displayed) Labs Reviewed  COMPREHENSIVE METABOLIC PANEL - Abnormal; Notable for the following components:      Result Value   Chloride 97 (*)    Glucose, Bld 237 (*)    Anion gap 16 (*)    All other components within normal limits  CBC - Abnormal; Notable for the following components:   Platelets 564 (*)    All other components within normal limits  CBG MONITORING, ED - Abnormal; Notable for the following components:   Glucose-Capillary 244 (*)    All other components within normal limits  POCT URINALYSIS DIPSTICK, ED / UC - Abnormal; Notable for the following components:   Glucose, UA 500 (*)    Bilirubin Urine SMALL (*)    Ketones, ur >=160 (*)    Protein, ur 30 (*)    All other components within normal limits  EKG Normal sinus rhythm.  Low voltage, otherwise normal EKG.  Radiology No results found.  Procedures Procedures (including critical care time)  Medications Ordered in UC Medications  sodium chloride 0.9 % bolus 1,000 mL (0 mLs Intravenous Stopped 01/21/20 1145)    Initial Impression / Assessment and Plan / UC Course  I have reviewed the triage vital signs and the nursing notes.  Pertinent labs & imaging results that were available during my care of the patient were reviewed by me and considered in my medical decision making (see chart for details).  Clinical Course as of Jan 20 1154  Mon Jan 21, 2020  1139 Comprehensive metabolic panel [JD]    Clinical Course User Index [JD] Jadine Brumley, Marguerita Beards, PA-C    #Covid #Nausea vomiting diarrhea #Acute urinary #Type 2 diabetes with long-term use of insulin-hyperglycemia Patient is a 54 year old diabetic patient with known Covid 19 infection presenting with continued and worsening symptoms.  Hyperglycemic in clinic, with ketonuria on urinalysis.  Clinically dehydrated.  Given COVID-19 infection, concern for electrolyte derangements and  dehydration with ketonuria, with some concern for developing DKA or other hyperglycemic emergency.  Given these concerns and clinically worsening symptoms, feel she needs further evaluation and work-up in the emergency department.  Given 1 L normal saline in clinic with CMP pending at time of discharge to [facility] emergency department.  Discussed case with attending physician Dr. Meda Coffee and felt, awaiting CMP was unnecessary prior to discharge.  Given patient's symptoms and IV access gained in urgent care without wanting to lose this, patient was escorted by clinical staff to [facility] emergency department.  Patient agrees to report to [facility] emergency department for further evaluation today.  Final Clinical Impressions(s) / UC Diagnoses   Final diagnoses:  COVID-19  Nausea vomiting and diarrhea  Ketonuria  Type 2 diabetes mellitus with hyperglycemia, with long-term current use of insulin [facility])     Discharge Instructions     Given your symptoms of COVID, blood sugars and findings today, you need further evaluation in the Emergency Department. Our staff will escort you there. It is very important you stay to be evaluated by a provider and complete the work up     ED Prescriptions    None     PDMP not reviewed this encounter.   Purnell Shoemaker, PA-C 01/21/20 1155

## 2020-01-21 NOTE — ED Notes (Signed)
Tolerating po sips ice water. approx 900cc IVF infused. Pt ambulated hallway and SpO2 remained 100% and HR max 101.

## 2020-01-21 NOTE — ED Provider Notes (Signed)
[facility] EMERGENCY DEPARTMENT Provider Note   CSN: [id number] Arrival date & time: 01/21/20  1149     History Chief Complaint  Patient presents with  . Generalized Body Aches    Rachel [name] is a 54 y.o. female.  HPI   54 year old female with known Covid.  Referred to the emergency from urgent care.  She has been having nausea, vomiting, crampy abdominal pain lightheadedness and headaches.  Some chest discomfort with deep inspiration and coughing.  Cough is nonproductive.  Subjective fevers.  She has been try to drink Gatorade but only been able to drink small amounts.  Past Medical History:  Diagnosis Date  . ALLERGIC RHINITIS 10/04/2007  . Anemia 01/21/2011  . Anxiety 11/15/2018  . ASTHMA 08/02/2007   INHALERS ONLY IN [place]  . Asthma 08/02/2007   Qualifier: Diagnosis of  By: Wynona Luna   . Blood transfusion without reported diagnosis    with first child   . Cervical radiculopathy 01/21/2011  . Cervicogenic headache 04/27/2017  . COMMON MIGRAINE 05/15/2009  . Depression 01/09/2010   Qualifier: Diagnosis of  By: Jenny Reichmann MD, Hunt Oris   . DIABETES MELLITUS, TYPE II 08/02/2007  . Dysphagia, pharyngoesophageal phase 06/12/2014  . GERD 08/02/2007  . HYPERLIPIDEMIA 08/02/2007  . Hyperlipidemia 08/02/2007   Qualifier: Diagnosis of  By: Wynona Luna   . INSOMNIA-SLEEP DISORDER-UNSPEC 01/04/2008  . INTERMITTENT VERTIGO 05/15/2009  . LIBIDO, DECREASED 01/09/2010  . Migraine without aura 05/15/2009   Qualifier: Diagnosis of  By: Jenny Reichmann MD, Hunt Oris   . Nonallopathic lesion of rib cage 12/21/2017  . Nonallopathic lesion of sacral region 07/29/2015  . Nonallopathic lesion of thoracic region 07/29/2015  . Osteoporosis 04/25/2019  . 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 Rheumatoid factor positive 02/10/2016  . Right knee pain 01/31/2019   Injected January 31, 2019  . Type 2 diabetes mellitus with hyperglycemia, with long-term current use of insulin ([place])  08/02/2007   Qualifier: Diagnosis of  By: Wynona Luna     Patient Active Problem List   Diagnosis Date Noted  . COVID-19 01/16/2020  . 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 Osteoporosis 04/25/2019  . Right knee pain 01/31/2019  . Anxiety 11/15/2018  . Nonallopathic lesion of rib cage 12/21/2017  . Cervicogenic headache 04/27/2017  . Nonallopathic lesion of cervical region 04/27/2017  . Rheumatoid factor positive 02/10/2016  . Nonallopathic lesion of lumbosacral region 07/29/2015  . Nonallopathic lesion of sacral region 07/29/2015  . Nonallopathic lesion of thoracic region 07/29/2015  . Dysphagia, pharyngoesophageal phase 06/12/2014  . Microcytic anemia 01/21/2011  . Cervical radiculopathy 01/21/2011  . Depression 01/09/2010  . Migraine without aura 05/15/2009  . INSOMNIA-SLEEP DISORDER-UNSPEC 01/04/2008  . Type 2 diabetes mellitus with hyperglycemia, with long-term current use of insulin ([place]) 08/02/2007  . Hyperlipidemia 08/02/2007  . Asthma 08/02/2007  . GERD 08/02/2007    Past Surgical History:  Procedure Laterality Date  . CESAREAN SECTION     x 3  . CYST EXCISION     left knee  . PAROTIDECTOMY  10/21/2011   Procedure: PAROTIDECTOMY;  Surgeon: Melida Quitter, MD;  Location: [facility];  Service: ENT;  Laterality: Left;     OB History   No obstetric history on file.     Family History  Problem Relation Age of Onset  . Hypertension Father   . Diabetes Father   . Asthma Father   . Cervical cancer Maternal Aunt   .  Heart disease Maternal Aunt   . Anesthesia problems Neg Hx   . Colon cancer Neg Hx   . Breast cancer Neg Hx     Social History   Tobacco Use  . Smoking status: Never Smoker  . Smokeless tobacco: Never Used  Vaping Use  . Vaping Use: Never used  Substance Use Topics  . Alcohol use: No    Alcohol/week: 0.0 standard drinks  . Drug use: No    Home Medications Prior to Admission medications   Medication Sig Start Date End Date  Taking? Authorizing Provider  aspirin 81 MG EC tablet Take 81 mg by mouth daily.      [provider]  atenolol (TENORMIN) 25 MG tablet Take 1 tablet (25 mg total) by mouth daily. X 7 days, then increase to 2 tabs (50mg ) daily Patient not taking: Reported on 10/15/2019 12/29/17   Pieter Partridge, DO  benzonatate (TESSALON) 200 MG capsule Take 1 capsule (200 mg total) by mouth 3 (three) times daily as needed for up to 7 days for cough. 01/18/20 01/25/20  Wieters, Hallie C, PA-C  Blood Glucose Monitoring Suppl (FREESTYLE LITE) DEVI Use as directed four times daily E11.9 11/14/18   Biagio Borg, MD  butalbital-acetaminophen-caffeine (FIORICET) [phone] MG tablet TAKE 1 TABLET BY MOUTH EVERY 6 HOURS AS NEEDED FOR HEADACHE (MAX OF 1 OR 2 TABLETS PER DAY) 10/16/19   Biagio Borg, MD  Cholecalciferol (VITAMIN D) 50 MCG (2000 UT) CAPS Take 4,000 Units by mouth daily.    [provider]  cyclobenzaprine (FLEXERIL) 5 MG tablet Take 1-2 tablets (5-10 mg total) by mouth 2 (two) times daily as needed for muscle spasms. 11/24/19   Wieters, Hallie C, PA-C  Diclofenac Sodium (PENNSAID) 2 % SOLN Place 2 g onto the skin 2 (two) times daily. Patient not taking: Reported on 10/15/2019 09/05/19   Lyndal Pulley, DO  esomeprazole (NEXIUM) 40 MG capsule TAKE 1 CAPSULE BY MOUTH 2 TIMES DAILY BEFORE A MEAL. Patient taking differently: Take 40 mg by mouth 2 (two) times daily before a meal.  01/25/19   Biagio Borg, MD  ferrous sulfate 324 MG TBEC Take 324 mg by mouth daily.    [provider]  fexofenadine (ALLEGRA) 180 MG tablet Take 1 tablet (180 mg total) by mouth daily. Patient not taking: Reported on 10/15/2019 10/04/19 10/03/20  Biagio Borg, MD  Fremanezumab-vfrm (AJOVY) 225 MG/1.5ML SOAJ Inject 225 mg into the skin every 30 (thirty) days. 08/29/19   Jaffe, Adam R, DO  gabapentin (NEURONTIN) 300 MG capsule TAKE 1 CAPSULE BY MOUTH 2 TIMES DAILY. Patient taking differently: Take 300 mg by mouth in the  morning and at bedtime. . 08/29/19   Pieter Partridge, DO  glucose blood (FREESTYLE LITE) test strip Use as instructed four times daily E11.9 11/14/18   Biagio Borg, MD  HYDROcodone-homatropine [facility]) 5-1.5 MG/5ML syrup Take 5 mLs by mouth at bedtime as needed for cough. 01/21/20   Biagio Borg, MD  Insulin Glargine [facility]) 100 UNIT/ML Inject 0.34 mLs (34 Units total) into the skin daily. 11/09/19   Biagio Borg, MD  insulin lispro (HUMALOG KWIKPEN) 100 UNIT/ML KwikPen Inject 0.05-0.08 mLs (5-8 Units total) into the skin 3 (three) times daily. Patient taking differently: Inject 4-8 Units into the skin 3 (three) times daily. Sliding scale : if blood sugar 130 - takes 4 units . If over 150 takes 8 units. 09/05/19   Philemon Kingdom, MD  Lancets (FREESTYLE) lancets Use as instructed four times daily E11.9 11/14/18   Biagio Borg, MD  Lasmiditan Succinate (REYVOW) 100 MG TABS Take 100 mg by mouth as needed. Take 1 tablet for headache. No more than 1 tablet in 24 hours 09/12/18   Pieter Partridge, DO  linaclotide (LINZESS) 145 MCG CAPS capsule Take 1 capsule (145 mcg total) by mouth daily before breakfast. Patient taking differently: Take 145 mcg by mouth daily as needed (for irritable bowel movement; pt states taking weekly).  11/14/18   Biagio Borg, MD  metoCLOPramide (REGLAN) 5 MG tablet Take 1 tablet (5 mg total) by mouth every 8 (eight) hours as needed for nausea or vomiting. 01/24/19   Biagio Borg, MD  Multiple Vitamin (MULTIVITAMIN WITH MINERALS) TABS tablet Take 1 tablet by mouth daily.    [provider]  naproxen (NAPROSYN) 500 MG tablet Take 1 tablet (500 mg total) by mouth 2 (two) times daily. 11/24/19   Wieters, Hallie C, PA-C  ondansetron (ZOFRAN ODT) 8 MG disintegrating tablet Take 1 tablet (8 mg total) by mouth every 8 (eight) hours as needed for nausea or vomiting. 01/18/20   Wieters, Hallie C, PA-C  promethazine (PHENERGAN) 25 MG tablet TAKE 1 TABLET BY MOUTH EVERY 4-6 HOURS AS  NEEDED 01/15/20   Hoyt Koch, MD  promethazine-dextromethorphan (PROMETHAZINE-DM) 6.25-15 MG/5ML syrup Take 5 mLs by mouth 4 (four) times daily as needed for cough. 01/15/20   Hoyt Koch, MD  rosuvastatin (CRESTOR) 40 MG tablet TAKE 1 TABLET (40 MG TOTAL) BY MOUTH DAILY. 08/30/18   Biagio Borg, MD  Semaglutide,0.25 or 0.5MG /DOS, (OZEMPIC, 0.25 OR 0.5 MG/DOSE,) 2 MG/1.5ML SOPN Inject 0.5 mg into the skin once a week. 07/02/19   Philemon Kingdom, MD  traMADol (ULTRAM) 50 MG tablet TAKE 1 TABLET BY MOUTH EVERY 8 HOURS AS NEEDED Patient taking differently: Take 50 mg by mouth 3 (three) times daily as needed for severe pain.  04/04/19   Biagio Borg, MD  triamcinolone (NASACORT) 55 MCG/ACT AERO nasal inhaler Place 2 sprays into the nose daily. Patient not taking: Reported on 10/15/2019 10/04/19   Biagio Borg, MD  UNIFINE PENTIPS 32G X 4 MM MISC USE 4 TIMES A DAY 11/09/19   Philemon Kingdom, MD    Allergies    Lipitor [atorvastatin] and Nitroglycerin  Review of Systems   Review of Systems All systems reviewed and negative, other than as noted in HPI.  Physical Exam Updated Vital Signs BP 129/88   Pulse 93   Temp 99 F (37.2 C) (Oral)   Resp (!) 22   Ht 5' (1.524 m)   Wt 62.1 kg   SpO2 100%   BMI 26.74 kg/m   Physical Exam Vitals and nursing note reviewed.  Constitutional:      Appearance: She is well-developed.     Comments: Laying in bed.  Appears tired, but nontoxic.  HENT:     Head: Normocephalic and atraumatic.  Eyes:     General:        Right eye: No discharge.        Left eye: No discharge.     Conjunctiva/sclera: Conjunctivae normal.  Cardiovascular:     Rate and Rhythm: Regular rhythm. Tachycardia present.     Heart sounds: Normal heart sounds. No murmur heard.  No friction rub. No gallop.   Pulmonary:     Effort: Pulmonary effort is normal. No respiratory distress.     Breath sounds: Normal  breath sounds.  Abdominal:     General: There is no  distension.     Palpations: Abdomen is soft.     Tenderness: There is no abdominal tenderness.  Musculoskeletal:        General: No tenderness.     Cervical back: Neck supple.  Skin:    General: Skin is warm and dry.  Neurological:     Mental Status: She is alert.  Psychiatric:        Behavior: Behavior normal.        Thought Content: Thought content normal.     ED Results / Procedures / Treatments   Labs (all labs ordered are listed, but only abnormal results are displayed) Labs Reviewed  CBC WITH DIFFERENTIAL/PLATELET - Abnormal; Notable for the following components:      Result Value   Hemoglobin 11.3 (*)    HCT 35.7 (*)    Platelets 533 (*)    Abs Immature Granulocytes 0.14 (*)    All other components within normal limits  CBG MONITORING, ED - Abnormal; Notable for the following components:   Glucose-Capillary 216 (*)    All other components within normal limits    EKG None  Radiology No results found.  Procedures Procedures (including critical care time)  Medications Ordered in ED Medications  lactated ringers bolus 2,000 mL (2,000 mLs Intravenous New Bag/Given 01/21/20 1952)  metoCLOPramide (REGLAN) injection 10 mg (10 mg Intravenous Given 01/21/20 1953)  ketorolac (TORADOL) 15 MG/ML injection 15 mg (15 mg Intravenous Given 01/21/20 1953)    ED Course  I have reviewed the triage vital signs and the nursing notes.  Pertinent labs & imaging results that were available during my care of the patient were reviewed by me and considered in my medical decision making (see chart for details).    MDM Rules/Calculators/A&P                          54 year old female symptomatic with Covid.  Fine from a respiratory standpoint.  Treated with IV fluids with improvement of symptoms.  No indication for hospitalization at this time.  Continue symptomatic treatment.  Return precautions discussed.  Aysha Patricia Nettle was evaluated in Emergency Department on 01/21/2020 for the  symptoms described in the history of present illness. She was evaluated in the context of the global COVID-19 pandemic, which necessitated consideration that the patient might be at risk for infection with the SARS-CoV-2 virus that causes COVID-19. Institutional protocols and algorithms that pertain to the evaluation of patients at risk for COVID-19 are in a state of rapid change based on information released by regulatory bodies including the CDC and federal and state organizations. These policies and algorithms were followed during the patient's care in the ED.  Final Clinical Impression(s) / ED Diagnoses Final diagnoses:  COVID-19 virus infection    Rx / DC Orders ED Discharge Orders    None       Virgel Manifold, MD 01/21/20 2322

## 2020-01-21 NOTE — ED Notes (Signed)
Patient is being discharged from the Urgent Care and sent to the Emergency Department via [facility]. Per J. Darr, patient is in need of higher level of care due to ketonuria, n/v, possible DKA. Patient is aware and verbalizes understanding of plan of care.  Vitals:   01/21/20 0941  BP: 130/85  Pulse: (!) 115  Resp: 20  Temp: 98.9 F (37.2 C)  SpO2: 98%

## 2020-01-22 NOTE — Telephone Encounter (Signed)
LVM to inform pt of cough medication.

## 2020-01-23 ENCOUNTER — Telehealth: Payer: Self-pay | Admitting: Internal Medicine

## 2020-01-23 NOTE — Telephone Encounter (Signed)
Per [facility] note, -Caller states she has COVID. Has had COVID for 15 days. She is tired. Has headache. Has back and neck pain. Temp 103 earlier this am. coughs at night. has been vomiting and nauseated. She is urinating.  Advised to go to ED now. Patient states that she will go to urgent care

## 2020-01-23 NOTE — Telephone Encounter (Signed)
   Patient calling to report she is covid+, fever 103, headache Call transferred to [facility] for immediate advice

## 2020-01-24 ENCOUNTER — Encounter: Payer: Self-pay | Admitting: Internal Medicine

## 2020-01-24 ENCOUNTER — Other Ambulatory Visit: Payer: Self-pay | Admitting: Internal Medicine

## 2020-01-24 ENCOUNTER — Telehealth (INDEPENDENT_AMBULATORY_CARE_PROVIDER_SITE_OTHER): Payer: 59 | Admitting: Internal Medicine

## 2020-01-24 DIAGNOSIS — U071 COVID-19: Secondary | ICD-10-CM | POA: Diagnosis not present

## 2020-01-24 DIAGNOSIS — J4520 Mild intermittent asthma, uncomplicated: Secondary | ICD-10-CM | POA: Diagnosis not present

## 2020-01-24 DIAGNOSIS — E1165 Type 2 diabetes mellitus with hyperglycemia: Secondary | ICD-10-CM | POA: Diagnosis not present

## 2020-01-24 DIAGNOSIS — Z794 Long term (current) use of insulin: Secondary | ICD-10-CM

## 2020-01-24 MED ORDER — ALBUTEROL SULFATE HFA 108 (90 BASE) MCG/ACT IN AERS
2.0000 | INHALATION_SPRAY | Freq: Four times a day (QID) | RESPIRATORY_TRACT | 5 refills | Status: DC | PRN
Start: 2020-01-24 — End: 2020-01-24

## 2020-01-24 MED ORDER — ALBUTEROL SULFATE HFA 108 (90 BASE) MCG/ACT IN AERS
2.0000 | INHALATION_SPRAY | Freq: Four times a day (QID) | RESPIRATORY_TRACT | 0 refills | Status: DC | PRN
Start: 2020-01-24 — End: 2020-01-24

## 2020-01-24 NOTE — Patient Instructions (Signed)
Please continue all other medications as before, and refills have been done if requested.  Please have the pharmacy call with any other refills you may need.  Please keep your appointments with your specialists as you may have planned  Your are will be given the work note when requested

## 2020-01-24 NOTE — Progress Notes (Signed)
Patient ID: Rachel [name], female   DOB: 07-31-1965, 54 y.o.   MRN: [id number]  Virtual Visit via Video Note  I connected with Rachel [name] on 01/24/20 at  3:40 PM EDT by a video enabled telemedicine application and verified that I am speaking with the correct person using two identifiers.  Location of all participants today Patient: at home Provider: at office   I discussed the limitations of evaluation and management by telemedicine and the availability of in person appointments. The patient expressed understanding and agreed to proceed.  History of Present Illness: Here to f/u recent covid symptom onset July 22 with dx July 29 with c/o persistent general weakness, fatigue, n/v, neck pain, sob, and lack of taste and smell, also some occasional diarrhea without blood, fever.  Works at [employer], just does not think she can return to work yet.  Was tx with monoclonal ab, antiemetic, tramadol and zinc, vit d, vit c, and cough med..  Was unvaccinated, but plans for vaccine at 90 days.  Asks for inhaler refill prn, and work excuse.  Last day worked was July 28.  Past Medical History:  Diagnosis Date  . ALLERGIC RHINITIS 10/04/2007  . Anemia 01/21/2011  . Anxiety 11/15/2018  . ASTHMA 08/02/2007   INHALERS ONLY IN [place]  . Asthma 08/02/2007   Qualifier: Diagnosis of  By: Wynona Luna   . Blood transfusion without reported diagnosis    with first child   . Cervical radiculopathy 01/21/2011  . Cervicogenic headache 04/27/2017  . COMMON MIGRAINE 05/15/2009  . Depression 01/09/2010   Qualifier: Diagnosis of  By: Jenny Reichmann MD, Hunt Oris   . DIABETES MELLITUS, TYPE II 08/02/2007  . Dysphagia, pharyngoesophageal phase 06/12/2014  . GERD 08/02/2007  . HYPERLIPIDEMIA 08/02/2007  . Hyperlipidemia 08/02/2007   Qualifier: Diagnosis of  By: Wynona Luna   . INSOMNIA-SLEEP DISORDER-UNSPEC 01/04/2008  . INTERMITTENT VERTIGO 05/15/2009  . LIBIDO, DECREASED 01/09/2010  . Migraine without aura  05/15/2009   Qualifier: Diagnosis of  By: Jenny Reichmann MD, Hunt Oris   . Nonallopathic lesion of rib cage 12/21/2017  . Nonallopathic lesion of sacral region 07/29/2015  . Nonallopathic lesion of thoracic region 07/29/2015  . Osteoporosis 04/25/2019  . 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 Rheumatoid factor positive 02/10/2016  . Right knee pain 01/31/2019   Injected January 31, 2019  . Type 2 diabetes mellitus with hyperglycemia, with long-term current use of insulin ([place]) 08/02/2007   Qualifier: Diagnosis of  By: Wynona Luna    Past Surgical History:  Procedure Laterality Date  . CESAREAN SECTION     x 3  . CYST EXCISION     left knee  . PAROTIDECTOMY  10/21/2011   Procedure: PAROTIDECTOMY;  Surgeon: Melida Quitter, MD;  Location: [facility];  Service: ENT;  Laterality: Left;    reports that she has never smoked. She has never used smokeless tobacco. She reports that she does not drink alcohol and does not use drugs. family history includes Asthma in her father; Cervical cancer in her maternal aunt; Diabetes in her father; Heart disease in her maternal aunt; Hypertension in her father. Allergies  Allergen Reactions  . Lipitor [Atorvastatin]     REACTION: myalgias  . Nitroglycerin    Current Outpatient Medications on File Prior to Visit  Medication Sig Dispense Refill  . aspirin 81 MG EC tablet Take 81 mg by mouth daily.      Marland Kitchen atenolol (TENORMIN) 25  MG tablet Take 1 tablet (25 mg total) by mouth daily. X 7 days, then increase to 2 tabs (50mg ) daily (Patient not taking: Reported on 10/15/2019) 60 tablet 3  . benzonatate (TESSALON) 200 MG capsule Take 1 capsule (200 mg total) by mouth 3 (three) times daily as needed for up to 7 days for cough. 28 capsule 0  . Blood Glucose Monitoring Suppl (FREESTYLE LITE) DEVI Use as directed four times daily E11.9 1 each 0  . butalbital-acetaminophen-caffeine (FIORICET) 50-325-40 MG tablet TAKE 1 TABLET BY MOUTH EVERY 6 HOURS AS NEEDED FOR HEADACHE (MAX  OF 1 OR 2 TABLETS PER DAY) 30 tablet 3  . Cholecalciferol (VITAMIN D) 50 MCG (2000 UT) CAPS Take 4,000 Units by mouth daily.    . cyclobenzaprine (FLEXERIL) 5 MG tablet Take 1-2 tablets (5-10 mg total) by mouth 2 (two) times daily as needed for muscle spasms. 24 tablet 0  . Diclofenac Sodium (PENNSAID) 2 % SOLN Place 2 g onto the skin 2 (two) times daily. (Patient not taking: Reported on 10/15/2019) 112 g 3  . esomeprazole (NEXIUM) 40 MG capsule TAKE 1 CAPSULE BY MOUTH 2 TIMES DAILY BEFORE A MEAL. (Patient taking differently: Take 40 mg by mouth 2 (two) times daily before a meal. ) 180 capsule 3  . ferrous sulfate 324 MG TBEC Take 324 mg by mouth daily.    . fexofenadine (ALLEGRA) 180 MG tablet Take 1 tablet (180 mg total) by mouth daily. (Patient not taking: Reported on 10/15/2019) 30 tablet 2  . Fremanezumab-vfrm (AJOVY) 225 MG/1.5ML SOAJ Inject 225 mg into the skin every 30 (thirty) days. 1 pen 11  . gabapentin (NEURONTIN) 300 MG capsule TAKE 1 CAPSULE BY MOUTH 2 TIMES DAILY. (Patient taking differently: Take 300 mg by mouth in the morning and at bedtime. Marland Kitchen) 60 capsule 5  . glucose blood (FREESTYLE LITE) test strip Use as instructed four times daily E11.9 400 each 12  . HYDROcodone-homatropine (HYCODAN) 5-1.5 MG/5ML syrup Take 5 mLs by mouth at bedtime as needed for cough. 75 mL 0  . Insulin Glargine (BASAGLAR KWIKPEN) 100 UNIT/ML Inject 0.34 mLs (34 Units total) into the skin daily. 30 mL 11  . insulin lispro (HUMALOG KWIKPEN) 100 UNIT/ML KwikPen Inject 0.05-0.08 mLs (5-8 Units total) into the skin 3 (three) times daily. (Patient taking differently: Inject 4-8 Units into the skin 3 (three) times daily. Sliding scale : if blood sugar 130 - takes 4 units . If over 150 takes 8 units.) 15 mL 11  . Lancets (FREESTYLE) lancets Use as instructed four times daily E11.9 400 each 12  . Lasmiditan Succinate (REYVOW) 100 MG TABS Take 100 mg by mouth as needed. Take 1 tablet for headache. No more than 1 tablet in  24 hours 8 tablet 3  . linaclotide (LINZESS) 145 MCG CAPS capsule Take 1 capsule (145 mcg total) by mouth daily before breakfast. (Patient taking differently: Take 145 mcg by mouth daily as needed (for irritable bowel movement; pt states taking weekly). ) 30 capsule 2  . metoCLOPramide (REGLAN) 5 MG tablet Take 1 tablet (5 mg total) by mouth every 8 (eight) hours as needed for nausea or vomiting. 40 tablet 1  . Multiple Vitamin (MULTIVITAMIN WITH MINERALS) TABS tablet Take 1 tablet by mouth daily.    . naproxen (NAPROSYN) 500 MG tablet Take 1 tablet (500 mg total) by mouth 2 (two) times daily. 30 tablet 0  . ondansetron (ZOFRAN ODT) 8 MG disintegrating tablet Take 1 tablet (8 mg total)  by mouth every 8 (eight) hours as needed for nausea or vomiting. 30 tablet 0  . promethazine (PHENERGAN) 25 MG tablet TAKE 1 TABLET BY MOUTH EVERY 4-6 HOURS AS NEEDED 12 tablet 0  . promethazine-dextromethorphan (PROMETHAZINE-DM) 6.25-15 MG/5ML syrup Take 5 mLs by mouth 4 (four) times daily as needed for cough. 118 mL 0  . rosuvastatin (CRESTOR) 40 MG tablet TAKE 1 TABLET (40 MG TOTAL) BY MOUTH DAILY. 90 tablet 3  . Semaglutide,0.25 or 0.5MG /DOS, (OZEMPIC, 0.25 OR 0.5 MG/DOSE,) 2 MG/1.5ML SOPN Inject 0.5 mg into the skin once a week. 2 pen 5  . traMADol (ULTRAM) 50 MG tablet TAKE 1 TABLET BY MOUTH EVERY 8 HOURS AS NEEDED (Patient taking differently: Take 50 mg by mouth 3 (three) times daily as needed for severe pain. ) 60 tablet 1  . triamcinolone (NASACORT) 55 MCG/ACT AERO nasal inhaler Place 2 sprays into the nose daily. (Patient not taking: Reported on 10/15/2019) 1 Inhaler 12  . UNIFINE PENTIPS 32G X 4 MM MISC USE 4 TIMES A DAY 200 each PRN   No current facility-administered medications on file prior to visit.    Observations/Objective: Alert, appears ill and fatigued, appropriate mood and affect, resps normal, cn 2-12 intact, moves all 4s, no visible rash or swelling Lab Results  Component Value Date   WBC 10.4  01/21/2020   HGB 11.3 (L) 01/21/2020   HCT 35.7 (L) 01/21/2020   PLT 533 (H) 01/21/2020   GLUCOSE 237 (H) 01/21/2020   CHOL 189 08/29/2019   TRIG 88.0 08/29/2019   HDL 64.10 08/29/2019   LDLDIRECT 191.0 08/23/2018   LDLCALC 108 (H) 08/29/2019   ALT 34 01/21/2020   AST 23 01/21/2020   NA 136 01/21/2020   K 3.5 01/21/2020   CL 97 (L) 01/21/2020   CREATININE 0.67 01/21/2020   BUN 6 01/21/2020   CO2 23 01/21/2020   TSH 0.76 08/29/2019   INR 1.08 07/23/2009   HGBA1C 10.0 (H) 08/29/2019   MICROALBUR 2.1 (H) 08/29/2019    Assessment and Plan: See notes  Follow Up Instructions: See notes   I discussed the assessment and treatment plan with the patient. The patient was provided an opportunity to ask questions and all were answered. The patient agreed with the plan and demonstrated an understanding of the instructions.   The patient was advised to call back or seek an in-person evaluation if the symptoms worsen or if the condition fails to improve as anticipated.  Cathlean Cower, MD

## 2020-01-24 NOTE — Assessment & Plan Note (Signed)
stable overall by history and exam, recent data reviewed with pt, and pt to continue medical treatment as before,  to f/u any worsening symptoms or concerns  

## 2020-01-24 NOTE — Assessment & Plan Note (Addendum)
For inhaler refill, work note off July 29 to return to work aug 30, and will need vaccination at 90 days  I spent 31 minutes in preparing to see the patient by review of recent labs, imaging and procedures, obtaining and reviewing separately obtained history, communicating with the patient and family or caregiver, ordering medications, tests or procedures, and documenting clinical information in the EHR including the differential Dx, treatment, and any further evaluation and other management of covid infection, dm, asthma

## 2020-01-28 ENCOUNTER — Telehealth: Payer: Self-pay | Admitting: Internal Medicine

## 2020-01-28 NOTE — Telephone Encounter (Signed)
Spoke with patient and informed I received FMLA via fax.   Forms have been completed for dates out starting 07-29 to 08-30, Placed in providers box to review and sign.

## 2020-01-28 NOTE — Telephone Encounter (Signed)
   Patient calling to inquire if FMLA paperwork has been received from [facility]

## 2020-01-29 NOTE — Telephone Encounter (Signed)
Forms have been signed, Faxed to [employer], Copy sent to scan, &Charged for.   Patient informed and original mailed to patient for her records.

## 2020-02-07 ENCOUNTER — Other Ambulatory Visit: Payer: Self-pay | Admitting: Internal Medicine

## 2020-02-12 ENCOUNTER — Other Ambulatory Visit: Payer: Self-pay | Admitting: Internal Medicine

## 2020-02-12 ENCOUNTER — Telehealth (INDEPENDENT_AMBULATORY_CARE_PROVIDER_SITE_OTHER): Payer: 59 | Admitting: Internal Medicine

## 2020-02-12 DIAGNOSIS — M7918 Myalgia, other site: Secondary | ICD-10-CM | POA: Diagnosis not present

## 2020-02-12 DIAGNOSIS — U071 COVID-19: Secondary | ICD-10-CM

## 2020-02-12 DIAGNOSIS — R42 Dizziness and giddiness: Secondary | ICD-10-CM | POA: Diagnosis not present

## 2020-02-12 MED ORDER — MELOXICAM 15 MG PO TABS
15.0000 mg | ORAL_TABLET | Freq: Every day | ORAL | 1 refills | Status: DC | PRN
Start: 2020-02-12 — End: 2020-02-12

## 2020-02-12 MED ORDER — MECLIZINE HCL 12.5 MG PO TABS
12.5000 mg | ORAL_TABLET | Freq: Three times a day (TID) | ORAL | 1 refills | Status: AC | PRN
Start: 2020-02-12 — End: 2021-02-11

## 2020-02-12 MED ORDER — NYSTATIN-TRIAMCINOLONE 100000-0.1 UNIT/GM-% EX OINT
1.0000 | TOPICAL_OINTMENT | Freq: Two times a day (BID) | CUTANEOUS | 2 refills | Status: DC
Start: 2020-02-12 — End: 2020-02-12

## 2020-02-12 MED ORDER — CYCLOBENZAPRINE HCL 5 MG PO TABS: 5.0000 mg | ORAL_TABLET | Freq: Three times a day (TID) | ORAL | 1 refills | Status: DC | PRN

## 2020-02-12 NOTE — Patient Instructions (Addendum)
Please take all medication as prescribed - the ointment refill  Please take all new medication as prescribed - the muscle relaxer, and anti-inflammatory for the neck pain  Please take all new medication as prescribed - the meclizine as needed for vertigo  You will be contacted regarding the referral for: Vestibular rehab (a kind of physical therapy)  Please have your husband pick up your note for being out of work, as well as the note for stating you are eligible for the covid vaccination at 90 days or after January 10, 2020 only  Please forward the FMLA form which we would fill in that given your post covid symptoms, you should be allowed a continuous period of time off work from Feb 11, 2020 to a return to work date of Mar 16, 2020  Please continue all other medications as before, and refills have been done if requested.  Please have the pharmacy call with any other refills you may need.  Please continue your efforts at being more active, low cholesterol diet, and weight control.  Please keep your appointments with your specialists as you may have planned

## 2020-02-14 ENCOUNTER — Telehealth: Payer: Self-pay | Admitting: Internal Medicine

## 2020-02-14 NOTE — Telephone Encounter (Signed)
I received FMLA for patient via fax.   LOV:02/12/20 Provider states "Please forward the FMLA form which we would fill in that given your post covid symptoms, you should be allowed a continuous period of time off work from Feb 11, 2020 to a return to work date of Mar 16, 2020"  Forms have been completed &Placed in providers box to review and sign.

## 2020-02-14 NOTE — Telephone Encounter (Signed)
Forms have been signed, Faxed to [employer], Copy sent to scan.   LVM to inform patient and original mailed to patient.

## 2020-02-17 NOTE — Progress Notes (Signed)
Patient ID: Rachel [name], female   DOB: [birth year], 54 y.o.   MRN: [id number]  Virtual Visit via Video Note  I connected with Rachel [name] on 8 31 2021 at  4:00 PM EDT by a video enabled telemedicine application and verified that I am speaking with the correct person using two identifiers.  Location of all participants today Patient: at home Provider:at office   I discussed the limitations of evaluation and management by telemedicine and the availability of in person appointments. The patient expressed understanding and agreed to proceed.  History of Present Illness: Here to f/u post covid; diagnosis was 7/29 and overall "rough" for her and not completely symptomaticaly improved it seems with persistent bilat lower post neck pain and aching without UE radicular symptoms such as pain, numbness or weakness, mild to mod, worse to turn head left and right and worse in the aM to get OOB. Also having persistent vertigo multiple time during day, positional but moderate to occasionally severe, unable to drive and has not been able to return to work, also has persistent generalized weakness and fatigue.  ALso needs note to present to work that she would not be due for covid vaccaination until 90 days after 7/29, as they have told her she needs mandated vaccination by oct 1 in order to keep her job.  Also has occasional no prod cough but Pt denies chest pain, increased sob or doe, wheezing, orthopnea, PND, increased LE swelling, palpitations, dizziness or syncope.  Pt denies new neurological symptoms such as new headache, or facial or extremity weakness or numbness   Pt denies polydipsia, polyuria,  Past Medical History:  Diagnosis Date  . ALLERGIC RHINITIS 10/04/2007  . Anemia 01/21/2011  . Anxiety 11/15/2018  . ASTHMA 08/02/2007   INHALERS ONLY IN [place]  . Asthma 08/02/2007   Qualifier: Diagnosis of  By: Wynona Luna   . Blood transfusion without reported diagnosis    with first child   .  Cervical radiculopathy 01/21/2011  . Cervicogenic headache 04/27/2017  . COMMON MIGRAINE 05/15/2009  . Depression 01/09/2010   Qualifier: Diagnosis of  By: Jenny Reichmann MD, Hunt Oris   . DIABETES MELLITUS, TYPE II 08/02/2007  . Dysphagia, pharyngoesophageal phase 06/12/2014  . GERD 08/02/2007  . HYPERLIPIDEMIA 08/02/2007  . Hyperlipidemia 08/02/2007   Qualifier: Diagnosis of  By: Wynona Luna   . INSOMNIA-SLEEP DISORDER-UNSPEC 01/04/2008  . INTERMITTENT VERTIGO 05/15/2009  . LIBIDO, DECREASED 01/09/2010  . Migraine without aura 05/15/2009   Qualifier: Diagnosis of  By: Jenny Reichmann MD, Hunt Oris   . Nonallopathic lesion of rib cage 12/21/2017  . Nonallopathic lesion of sacral region 07/29/2015  . Nonallopathic lesion of thoracic region 07/29/2015  . Osteoporosis 04/25/2019  . 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 Rheumatoid factor positive 02/10/2016  . Right knee pain 01/31/2019   Injected January 31, 2019  . Type 2 diabetes mellitus with hyperglycemia, with long-term current use of insulin ([place]) 08/02/2007   Qualifier: Diagnosis of  By: Wynona Luna    Past Surgical History:  Procedure Laterality Date  . CESAREAN SECTION     x 3  . CYST EXCISION     left knee  . PAROTIDECTOMY  10/21/2011   Procedure: PAROTIDECTOMY;  Surgeon: Melida Quitter, MD;  Location: [facility];  Service: ENT;  Laterality: Left;    reports that she has never smoked. She has never used smokeless tobacco. She reports that she does not drink alcohol and  does not use drugs. family history includes Asthma in her father; Cervical cancer in her maternal aunt; Diabetes in her father; Heart disease in her maternal aunt; Hypertension in her father. Allergies  Allergen Reactions  . Lipitor [Atorvastatin]     REACTION: myalgias  . Nitroglycerin    Current Outpatient Medications on File Prior to Visit  Medication Sig Dispense Refill  . albuterol (VENTOLIN HFA) 108 (90 Base) MCG/ACT inhaler Inhale 2 puffs into the lungs every 6 (six)  hours as needed for wheezing or shortness of breath. 18 g 5  . aspirin 81 MG EC tablet Take 81 mg by mouth daily.      Marland Kitchen atenolol (TENORMIN) 25 MG tablet Take 1 tablet (25 mg total) by mouth daily. X 7 days, then increase to 2 tabs (50mg ) daily (Patient not taking: Reported on 10/15/2019) 60 tablet 3  . Blood Glucose Monitoring Suppl (FREESTYLE LITE) DEVI Use as directed four times daily E11.9 1 each 0  . butalbital-acetaminophen-caffeine (FIORICET) 50-325-40 MG tablet TAKE 1 TABLET BY MOUTH EVERY 6 HOURS AS NEEDED FOR HEADACHE (MAX OF 1 OR 2 TABLETS PER DAY) 30 tablet 3  . Cholecalciferol (VITAMIN D) 50 MCG (2000 UT) CAPS Take 4,000 Units by mouth daily.    . Diclofenac Sodium (PENNSAID) 2 % SOLN Place 2 g onto the skin 2 (two) times daily. (Patient not taking: Reported on 10/15/2019) 112 g 3  . esomeprazole (NEXIUM) 40 MG capsule TAKE 1 CAPSULE BY MOUTH 2 TIMES DAILY BEFORE A MEAL. 180 capsule 3  . ferrous sulfate 324 MG TBEC Take 324 mg by mouth daily.    . fexofenadine (ALLEGRA) 180 MG tablet Take 1 tablet (180 mg total) by mouth daily. (Patient not taking: Reported on 10/15/2019) 30 tablet 2  . Fremanezumab-vfrm (AJOVY) 225 MG/1.5ML SOAJ Inject 225 mg into the skin every 30 (thirty) days. 1 pen 11  . gabapentin (NEURONTIN) 300 MG capsule TAKE 1 CAPSULE BY MOUTH 2 TIMES DAILY. (Patient taking differently: Take 300 mg by mouth in the morning and at bedtime. Marland Kitchen) 60 capsule 5  . glucose blood (FREESTYLE LITE) test strip Use as instructed four times daily E11.9 400 each 12  . HYDROcodone-homatropine (HYCODAN) 5-1.5 MG/5ML syrup Take 5 mLs by mouth at bedtime as needed for cough. 75 mL 0  . Insulin Glargine (BASAGLAR KWIKPEN) 100 UNIT/ML Inject 0.34 mLs (34 Units total) into the skin daily. 30 mL 11  . insulin lispro (HUMALOG KWIKPEN) 100 UNIT/ML KwikPen Inject 0.05-0.08 mLs (5-8 Units total) into the skin 3 (three) times daily. (Patient taking differently: Inject 4-8 Units into the skin 3 (three) times  daily. Sliding scale : if blood sugar 130 - takes 4 units . If over 150 takes 8 units.) 15 mL 11  . Lancets (FREESTYLE) lancets Use as instructed four times daily E11.9 400 each 12  . Lasmiditan Succinate (REYVOW) 100 MG TABS Take 100 mg by mouth as needed. Take 1 tablet for headache. No more than 1 tablet in 24 hours 8 tablet 3  . linaclotide (LINZESS) 145 MCG CAPS capsule Take 1 capsule (145 mcg total) by mouth daily before breakfast. (Patient taking differently: Take 145 mcg by mouth daily as needed (for irritable bowel movement; pt states taking weekly). ) 30 capsule 2  . metoCLOPramide (REGLAN) 5 MG tablet Take 1 tablet (5 mg total) by mouth every 8 (eight) hours as needed for nausea or vomiting. 40 tablet 1  . Multiple Vitamin (MULTIVITAMIN WITH MINERALS) TABS tablet Take 1  tablet by mouth daily.    . naproxen (NAPROSYN) 500 MG tablet Take 1 tablet (500 mg total) by mouth 2 (two) times daily. 30 tablet 0  . ondansetron (ZOFRAN ODT) 8 MG disintegrating tablet Take 1 tablet (8 mg total) by mouth every 8 (eight) hours as needed for nausea or vomiting. 30 tablet 0  . promethazine (PHENERGAN) 25 MG tablet TAKE 1 TABLET BY MOUTH EVERY 4-6 HOURS AS NEEDED 12 tablet 0  . promethazine-dextromethorphan (PROMETHAZINE-DM) 6.25-15 MG/5ML syrup Take 5 mLs by mouth 4 (four) times daily as needed for cough. 118 mL 0  . rosuvastatin (CRESTOR) 40 MG tablet TAKE 1 TABLET (40 MG TOTAL) BY MOUTH DAILY. 90 tablet 3  . Semaglutide,0.25 or 0.5MG /DOS, (OZEMPIC, 0.25 OR 0.5 MG/DOSE,) 2 MG/1.5ML SOPN Inject 0.5 mg into the skin once a week. 2 pen 5  . traMADol (ULTRAM) 50 MG tablet TAKE 1 TABLET BY MOUTH EVERY 8 HOURS AS NEEDED (Patient taking differently: Take 50 mg by mouth 3 (three) times daily as needed for severe pain. ) 60 tablet 1  . triamcinolone (NASACORT) 55 MCG/ACT AERO nasal inhaler Place 2 sprays into the nose daily. (Patient not taking: Reported on 10/15/2019) 1 Inhaler 12  . UNIFINE PENTIPS 32G X 4 MM MISC USE  4 TIMES A DAY 200 each PRN   No current facility-administered medications on file prior to visit.    Observations/Objective: Alert, NAD, appropriate mood and affect, resps normal, cn 2-12 intact, moves all 4s, no visible rash or swelling Lab Results  Component Value Date   WBC 10.4 01/21/2020   HGB 11.3 (L) 01/21/2020   HCT 35.7 (L) 01/21/2020   PLT 533 (H) 01/21/2020   GLUCOSE 237 (H) 01/21/2020   CHOL 189 08/29/2019   TRIG 88.0 08/29/2019   HDL 64.10 08/29/2019   LDLDIRECT 191.0 08/23/2018   LDLCALC 108 (H) 08/29/2019   ALT 34 01/21/2020   AST 23 01/21/2020   NA 136 01/21/2020   K 3.5 01/21/2020   CL 97 (L) 01/21/2020   CREATININE 0.67 01/21/2020   BUN 6 01/21/2020   CO2 23 01/21/2020   TSH 0.76 08/29/2019   INR 1.08 07/23/2009   HGBA1C 10.0 (H) 08/29/2019   MICROALBUR 2.1 (H) 08/29/2019   Assessment and Plan: See notes  Follow Up Instructions: See notes   I discussed the assessment and treatment plan with the patient. The patient was provided an opportunity to ask questions and all were answered. The patient agreed with the plan and demonstrated an understanding of the instructions.   The patient was advised to call back or seek an in-person evaluation if the symptoms worsen or if the condition fails to improve as anticipated.   Cathlean Cower, MD

## 2020-02-18 ENCOUNTER — Encounter: Payer: Self-pay | Admitting: Internal Medicine

## 2020-02-18 NOTE — Assessment & Plan Note (Signed)
Mild to mod, for muscle relaxer prn, nsaid prn, to f/u any worsening symptoms or concerns

## 2020-02-18 NOTE — Assessment & Plan Note (Addendum)
Ok for note to delay mandated covid vaccination to 90 days post infection, and fMLA to be done for out of work total aug 30 to return to work date of Mar 16, 2020  I spent 31 minutes in preparing to see the patient by review of recent labs, imaging and procedures, obtaining and reviewing separately obtained history, communicating with the patient and family or caregiver, ordering medications, tests or procedures, and documenting clinical information in the EHR including the differential Dx, treatment, and any further evaluation and other management of covid 19 infection, vertigo, and myofascial pain

## 2020-02-18 NOTE — Assessment & Plan Note (Signed)
Ok for meclizine prn, and referral to vestibular rehab

## 2020-02-19 ENCOUNTER — Encounter: Payer: Self-pay | Admitting: Neurology

## 2020-02-19 NOTE — Progress Notes (Addendum)
Estefanie MUNOZ Key: [id number] - [place] Case ID: [id number] help? Call us at [phone] Outcome Approvedtoday The request has been approved. The authorization is effective for a maximum of 12 fills from 02/19/2020 to 02/17/2021, as long as the member is enrolled in their current health plan. This has been approved for a quantity limit of 1.5 with a day supply limit of 30.0. A written notification letter will follow with additional details.

## 2020-02-20 ENCOUNTER — Ambulatory Visit (INDEPENDENT_AMBULATORY_CARE_PROVIDER_SITE_OTHER): Payer: 59 | Admitting: Nurse Practitioner

## 2020-02-20 ENCOUNTER — Other Ambulatory Visit: Payer: Self-pay

## 2020-02-20 ENCOUNTER — Telehealth: Payer: Self-pay | Admitting: Internal Medicine

## 2020-02-20 VITALS — BP 94/68 | HR 97 | Temp 97.3°F | Ht 60.0 in | Wt 136.0 lb

## 2020-02-20 DIAGNOSIS — Z8616 Personal history of COVID-19: Secondary | ICD-10-CM | POA: Insufficient documentation

## 2020-02-20 DIAGNOSIS — I951 Orthostatic hypotension: Secondary | ICD-10-CM | POA: Diagnosis not present

## 2020-02-20 DIAGNOSIS — R5383 Other fatigue: Secondary | ICD-10-CM | POA: Diagnosis not present

## 2020-02-20 DIAGNOSIS — R000 Tachycardia, unspecified: Secondary | ICD-10-CM | POA: Diagnosis not present

## 2020-02-20 DIAGNOSIS — R5381 Other malaise: Secondary | ICD-10-CM

## 2020-02-20 DIAGNOSIS — H8113 Benign paroxysmal vertigo, bilateral: Secondary | ICD-10-CM | POA: Insufficient documentation

## 2020-02-20 NOTE — Progress Notes (Signed)
@Patient  ID: Rachel [name], female    DOB: 03-20-1966, 54 y.o.   MRN: [id number]  Chief Complaint  Patient presents with  . Covid Positive    Positive 7/29, Infusion 8/5. Sx: Headaches, dizziness, upper back into neck.     Referring provider: Biagio Borg, MD   54 year old female with history of migraine, allergic rhinitis, asthma, GERD, diabetes, cervical radiculopathy, myofascial pain syndrome, depression, anemia, hyperlipidemia.  Diagnosed with Covid on 01/10/2020.  HPI  Patient presents today for post COVID care clinic visit.  She was diagnosed with Covid on 01/10/2020.  She did receive monoclonal antibody infusion on 01/17/2020.  Patient states that she continues to have intermittent headaches, dizziness, upper back and neck pain.  Patient is tachycardic in office today.  She states that she does have a history of tachycardia.  Patient states that she does experience dizziness when going from sitting to standing position or laying to sitting position.  She also experiences dizziness when she bends over.  She has been trying to stay active but gets tired very quickly.  Patient has followed up with PCP since diagnosed with Covid and was prescribed meclizine for vertigo and a referral was placed for vestibular rehab.  She has not started physical therapy yet.  She has not returned to work since being diagnosed with Covid. Denies f/c/s, n/v/d, hemoptysis, PND, chest pain or edema.      Allergies  Allergen Reactions  . Lipitor [Atorvastatin]     REACTION: myalgias  . Nitroglycerin     Immunization History  Administered Date(s) Administered  . H1N1 04/15/2008  . Influenza Whole 04/22/2008, 03/14/2010  . Influenza,inj,Quad PF,6+ Mos 03/13/2014, 02/28/2019  . Pneumococcal Conjugate-13 07/09/2015  . Pneumococcal Polysaccharide-23 07/08/2008, 06/25/2016  . Td 10/03/2006  . Tdap 06/25/2016    Past Medical History:  Diagnosis Date  . ALLERGIC RHINITIS 10/04/2007  . Anemia 01/21/2011   . Anxiety 11/15/2018  . ASTHMA 08/02/2007   INHALERS ONLY IN [place]  . Asthma 08/02/2007   Qualifier: Diagnosis of  By: Wynona Luna   . Blood transfusion without reported diagnosis    with first child   . Cervical radiculopathy 01/21/2011  . Cervicogenic headache 04/27/2017  . COMMON MIGRAINE 05/15/2009  . Depression 01/09/2010   Qualifier: Diagnosis of  By: Jenny Reichmann MD, Hunt Oris   . DIABETES MELLITUS, TYPE II 08/02/2007  . Dysphagia, pharyngoesophageal phase 06/12/2014  . GERD 08/02/2007  . HYPERLIPIDEMIA 08/02/2007  . Hyperlipidemia 08/02/2007   Qualifier: Diagnosis of  By: Wynona Luna   . INSOMNIA-SLEEP DISORDER-UNSPEC 01/04/2008  . INTERMITTENT VERTIGO 05/15/2009  . LIBIDO, DECREASED 01/09/2010  . Migraine without aura 05/15/2009   Qualifier: Diagnosis of  By: Jenny Reichmann MD, Hunt Oris   . Nonallopathic lesion of rib cage 12/21/2017  . Nonallopathic lesion of sacral region 07/29/2015  . Nonallopathic lesion of thoracic region 07/29/2015  . Osteoporosis 04/25/2019  . 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 Rheumatoid factor positive 02/10/2016  . Right knee pain 01/31/2019   Injected January 31, 2019  . Type 2 diabetes mellitus with hyperglycemia, with long-term current use of insulin ([place]) 08/02/2007   Qualifier: Diagnosis of  By: Wynona Luna     Tobacco History: Social History   Tobacco Use  Smoking Status Never Smoker  Smokeless Tobacco Never Used   Counseling given: Yes   Outpatient Encounter Medications as of 02/20/2020  Medication Sig  . albuterol (VENTOLIN HFA) 108 (90 Base) MCG/ACT inhaler  Inhale 2 puffs into the lungs every 6 (six) hours as needed for wheezing or shortness of breath.  Marland Kitchen aspirin 81 MG EC tablet Take 81 mg by mouth daily.    . cyclobenzaprine (FLEXERIL) 5 MG tablet Take 1 tablet (5 mg total) by mouth 3 (three) times daily as needed for muscle spasms.  Marland Kitchen linaclotide (LINZESS) 145 MCG CAPS capsule Take 1 capsule (145 mcg total) by mouth daily before  breakfast. (Patient taking differently: Take 145 mcg by mouth daily as needed (for irritable bowel movement; pt states taking weekly). )  . meclizine (ANTIVERT) 12.5 MG tablet Take 1 tablet (12.5 mg total) by mouth 3 (three) times daily as needed for dizziness.  . meloxicam (MOBIC) 15 MG tablet Take 1 tablet (15 mg total) by mouth daily as needed for pain.  Marland Kitchen metoCLOPramide (REGLAN) 5 MG tablet Take 1 tablet (5 mg total) by mouth every 8 (eight) hours as needed for nausea or vomiting.  . Multiple Vitamin (MULTIVITAMIN WITH MINERALS) TABS tablet Take 1 tablet by mouth daily.  . naproxen (NAPROSYN) 500 MG tablet Take 1 tablet (500 mg total) by mouth 2 (two) times daily.  Marland Kitchen nystatin-triamcinolone ointment (MYCOLOG) Apply 1 application topically 2 (two) times daily.  . ondansetron (ZOFRAN ODT) 8 MG disintegrating tablet Take 1 tablet (8 mg total) by mouth every 8 (eight) hours as needed for nausea or vomiting.  . rosuvastatin (CRESTOR) 40 MG tablet TAKE 1 TABLET (40 MG TOTAL) BY MOUTH DAILY.  . traMADol (ULTRAM) 50 MG tablet TAKE 1 TABLET BY MOUTH EVERY 8 HOURS AS NEEDED (Patient taking differently: Take 50 mg by mouth 3 (three) times daily as needed for severe pain. )  . atenolol (TENORMIN) 25 MG tablet Take 1 tablet (25 mg total) by mouth daily. X 7 days, then increase to 2 tabs (50mg ) daily (Patient not taking: Reported on 10/15/2019)  . Blood Glucose Monitoring Suppl (FREESTYLE LITE) DEVI Use as directed four times daily E11.9  . butalbital-acetaminophen-caffeine (FIORICET) 50-325-40 MG tablet TAKE 1 TABLET BY MOUTH EVERY 6 HOURS AS NEEDED FOR HEADACHE (MAX OF 1 OR 2 TABLETS PER DAY)  . Cholecalciferol (VITAMIN D) 50 MCG (2000 UT) CAPS Take 4,000 Units by mouth daily.  . Diclofenac Sodium (PENNSAID) 2 % SOLN Place 2 g onto the skin 2 (two) times daily. (Patient not taking: Reported on 10/15/2019)  . esomeprazole (NEXIUM) 40 MG capsule TAKE 1 CAPSULE BY MOUTH 2 TIMES DAILY BEFORE A MEAL.  . ferrous  sulfate 324 MG TBEC Take 324 mg by mouth daily.  . fexofenadine (ALLEGRA) 180 MG tablet Take 1 tablet (180 mg total) by mouth daily. (Patient not taking: Reported on 10/15/2019)  . Fremanezumab-vfrm (AJOVY) 225 MG/1.5ML SOAJ Inject 225 mg into the skin every 30 (thirty) days.  Marland Kitchen gabapentin (NEURONTIN) 300 MG capsule TAKE 1 CAPSULE BY MOUTH 2 TIMES DAILY. (Patient taking differently: Take 300 mg by mouth in the morning and at bedtime. Marland Kitchen)  . glucose blood (FREESTYLE LITE) test strip Use as instructed four times daily E11.9  . HYDROcodone-homatropine (HYCODAN) 5-1.5 MG/5ML syrup Take 5 mLs by mouth at bedtime as needed for cough.  . Insulin Glargine (BASAGLAR KWIKPEN) 100 UNIT/ML Inject 0.34 mLs (34 Units total) into the skin daily.  . insulin lispro (HUMALOG KWIKPEN) 100 UNIT/ML KwikPen Inject 0.05-0.08 mLs (5-8 Units total) into the skin 3 (three) times daily. (Patient taking differently: Inject 4-8 Units into the skin 3 (three) times daily. Sliding scale : if blood sugar 130 -  takes 4 units . If over 150 takes 8 units.)  . Lancets (FREESTYLE) lancets Use as instructed four times daily E11.9  . Lasmiditan Succinate (REYVOW) 100 MG TABS Take 100 mg by mouth as needed. Take 1 tablet for headache. No more than 1 tablet in 24 hours (Patient not taking: Reported on 02/20/2020)  . promethazine (PHENERGAN) 25 MG tablet TAKE 1 TABLET BY MOUTH EVERY 4-6 HOURS AS NEEDED (Patient not taking: Reported on 02/20/2020)  . promethazine-dextromethorphan (PROMETHAZINE-DM) 6.25-15 MG/5ML syrup Take 5 mLs by mouth 4 (four) times daily as needed for cough. (Patient not taking: Reported on 02/20/2020)  . Semaglutide,0.25 or 0.5MG /DOS, (OZEMPIC, 0.25 OR 0.5 MG/DOSE,) 2 MG/1.5ML SOPN Inject 0.5 mg into the skin once a week. (Patient not taking: Reported on 02/20/2020)  . triamcinolone (NASACORT) 55 MCG/ACT AERO nasal inhaler Place 2 sprays into the nose daily. (Patient not taking: Reported on 10/15/2019)  . UNIFINE PENTIPS 32G X 4 MM MISC  USE 4 TIMES A DAY (Patient not taking: Reported on 02/20/2020)   No facility-administered encounter medications on file as of 02/20/2020.     Review of Systems  Review of Systems  Constitutional: Positive for fatigue. Negative for fever.  HENT: Negative.   Respiratory: Negative for cough and shortness of breath.   Cardiovascular: Negative.   Gastrointestinal: Negative.   Musculoskeletal: Positive for arthralgias, myalgias and neck pain.  Allergic/Immunologic: Negative.   Neurological: Positive for dizziness and headaches.  Psychiatric/Behavioral: Negative.        Physical Exam  BP 94/68 Comment: standing  Pulse 97   Temp (!) 97.3 F (36.3 C)   Ht 5' (1.524 m)   Wt 136 lb 0.1 oz (61.7 kg)   SpO2 97%   BMI 26.56 kg/m   Wt Readings from Last 5 Encounters:  02/20/20 136 lb 0.1 oz (61.7 kg)  01/21/20 136 lb 14.5 oz (62.1 kg)  10/15/19 147 lb 0.8 oz (66.7 kg)  10/15/19 147 lb (66.7 kg)  10/04/19 152 lb (68.9 kg)     Physical Exam Vitals and nursing note reviewed.  Constitutional:      General: She is not in acute distress.    Appearance: She is well-developed.  Cardiovascular:     Rate and Rhythm: Regular rhythm. Tachycardia present.  Pulmonary:     Effort: Pulmonary effort is normal.     Breath sounds: Normal breath sounds.  Musculoskeletal:     Right lower leg: No edema.     Left lower leg: No edema.  Neurological:     Mental Status: She is alert and oriented to person, place, and time.        Assessment & Plan:   History of COVID-19 Fatigue Deconditioning  Vertigo:  Referral placed for PT for vertigo and deconditioning  Stay well hydrated  Stay active - handout given on home PT exercises  May continue meclizine as prescribed by PCP.   Tachycardia Orthostatic hypotension:  Most likely due to deconditioning  Patient walked in office today.  O2 sats remained above 97% for entire walk.  Heart rate was elevated at 117 bpm with minimal exertion.   Patient did become dizzy -blood pressure checked and did drop to 94/68 while standing.  Stay well hydrated  Will place referral to cardiology  Will check labs and call with results  Get up and down very slowly   Follow up:  Follow up in 3 weeks or sooner if needed         Fenton Foy,  NP 02/20/2020

## 2020-02-20 NOTE — Assessment & Plan Note (Signed)
Fatigue Deconditioning  Vertigo:  Referral placed for PT for vertigo and deconditioning  Stay well hydrated  Stay active - handout given on home PT exercises  May continue meclizine as prescribed by PCP.   Tachycardia Orthostatic hypotension:  Most likely due to deconditioning  Patient walked in office today.  O2 sats remained above 97% for entire walk.  Heart rate was elevated at 117 bpm with minimal exertion.  Patient did become dizzy -blood pressure checked and did drop to 94/68 while standing.  Stay well hydrated  Will place referral to cardiology  Will check labs and call with results  Get up and down very slowly   Follow up:  Follow up in 3 weeks or sooner if needed

## 2020-02-20 NOTE — Patient Instructions (Addendum)
History of Covid Fatigue Deconditioning  Vertigo:  Referral placed for PT for vertigo and deconditioning  Stay well hydrated  Stay active - handout given on home PT exercises  May continue meclizine as prescribed by PCP.   Tachycardia Orthostatic hypotension:  Most likely due to deconditioning  Patient walked in office today.  O2 sats remained above 97% for entire walk.  Heart rate was elevated at 117 bpm with minimal exertion.  Patient did become dizzy -blood pressure checked and did drop to 94/68 while standing.  Stay well hydrated  Will place referral to cardiology  Will check labs and call with results  Get up and down very slowly   Follow up:  Follow up in 3 weeks or sooner if needed

## 2020-02-20 NOTE — Telephone Encounter (Signed)
Patient called and had questions about her short term disability papers. She said that they were faxed on 02/14/2020 to the office. She is requesting a call back.

## 2020-02-21 ENCOUNTER — Encounter: Payer: Self-pay | Admitting: Physical Therapy

## 2020-02-21 ENCOUNTER — Ambulatory Visit: Payer: 59 | Attending: Internal Medicine | Admitting: Physical Therapy

## 2020-02-21 ENCOUNTER — Other Ambulatory Visit: Payer: Self-pay

## 2020-02-21 DIAGNOSIS — R42 Dizziness and giddiness: Secondary | ICD-10-CM | POA: Diagnosis not present

## 2020-02-21 DIAGNOSIS — M6281 Muscle weakness (generalized): Secondary | ICD-10-CM | POA: Diagnosis not present

## 2020-02-21 DIAGNOSIS — H8112 Benign paroxysmal vertigo, left ear: Secondary | ICD-10-CM | POA: Diagnosis not present

## 2020-02-21 DIAGNOSIS — M542 Cervicalgia: Secondary | ICD-10-CM | POA: Diagnosis not present

## 2020-02-21 DIAGNOSIS — H8111 Benign paroxysmal vertigo, right ear: Secondary | ICD-10-CM | POA: Insufficient documentation

## 2020-02-21 DIAGNOSIS — R262 Difficulty in walking, not elsewhere classified: Secondary | ICD-10-CM | POA: Insufficient documentation

## 2020-02-21 DIAGNOSIS — R2681 Unsteadiness on feet: Secondary | ICD-10-CM | POA: Insufficient documentation

## 2020-02-21 LAB — CBC
Hematocrit: 37.5 % (ref 34.0–46.6)
Hemoglobin: 12.4 g/dL (ref 11.1–15.9)
MCH: 26.7 pg (ref 26.6–33.0)
MCHC: 33.1 g/dL (ref 31.5–35.7)
MCV: 81 fL (ref 79–97)
Platelets: 367 10*3/uL (ref 150–450)
RBC: 4.65 x10E6/uL (ref 3.77–5.28)
RDW: 13.9 % (ref 11.7–15.4)
WBC: 8.4 10*3/uL (ref 3.4–10.8)

## 2020-02-21 LAB — COMPREHENSIVE METABOLIC PANEL
ALT: 14 IU/L (ref 0–32)
AST: 16 IU/L (ref 0–40)
Albumin/Globulin Ratio: 1.9 (ref 1.2–2.2)
Albumin: 4.7 g/dL (ref 3.8–4.9)
Alkaline Phosphatase: 87 IU/L (ref 48–121)
BUN/Creatinine Ratio: 11 (ref 9–23)
BUN: 8 mg/dL (ref 6–24)
Bilirubin Total: 0.3 mg/dL (ref 0.0–1.2)
CO2: 22 mmol/L (ref 20–29)
Calcium: 10.1 mg/dL (ref 8.7–10.2)
Chloride: 96 mmol/L (ref 96–106)
Creatinine, Ser: 0.76 mg/dL (ref 0.57–1.00)
GFR calc Af Amer: 102 mL/min/{1.73_m2} (ref >59)
GFR calc non Af Amer: 89 mL/min/{1.73_m2} (ref >59)
Globulin, Total: 2.5 g/dL (ref 1.5–4.5)
Glucose: 204 mg/dL — ABNORMAL HIGH (ref 65–99)
Potassium: 4.9 mmol/L (ref 3.5–5.2)
Sodium: 136 mmol/L (ref 134–144)
Total Protein: 7.2 g/dL (ref 6.0–8.5)

## 2020-02-21 LAB — VITAMIN D 25 HYDROXY (VIT D DEFICIENCY, FRACTURES): Vit D, 25-Hydroxy: 75.8 ng/mL (ref 30.0–100.0)

## 2020-02-21 LAB — THYROID PANEL WITH TSH
Free Thyroxine Index: 2.3 (ref 1.2–4.9)
T3 Uptake Ratio: 29 % (ref 24–39)
T4, Total: 7.8 ug/dL (ref 4.5–12.0)
TSH: 1.15 u[IU]/mL (ref 0.450–4.500)

## 2020-02-21 NOTE — Telephone Encounter (Signed)
TE on this already dated 02/14/20.   LVM for patient to call back.

## 2020-02-21 NOTE — Therapy (Signed)
[facility] [address] [place], [place], [address] Phone: [phone]   Fax:  [phone]  Physical Therapy Evaluation  Patient Details  Name: Rachel [name] MRN: [id number] Date of Birth: 01/12/1966 Referring Provider (PT): Fenton Foy, NP   Encounter Date: 02/21/2020   PT End of Session - 02/21/20 1245    Visit Number 1    Number of Visits 17    Date for PT Re-Evaluation 04/21/20    Authorization Type [facility]; VL: [place]    PT Start Time 1884    PT Stop Time 1230    PT Time Calculation (min) 45 min    Activity Tolerance Patient tolerated treatment well    Behavior During Therapy [facility] for tasks assessed/performed           Past Medical History:  Diagnosis Date  . ALLERGIC RHINITIS 10/04/2007  . Anemia 01/21/2011  . Anxiety 11/15/2018  . ASTHMA 08/02/2007   INHALERS ONLY IN [place]  . Asthma 08/02/2007   Qualifier: Diagnosis of  By: Wynona Luna   . Blood transfusion without reported diagnosis    with first child   . Cervical radiculopathy 01/21/2011  . Cervicogenic headache 04/27/2017  . COMMON MIGRAINE 05/15/2009  . Depression 01/09/2010   Qualifier: Diagnosis of  By: Jenny Reichmann MD, Hunt Oris   . DIABETES MELLITUS, TYPE II 08/02/2007  . Dysphagia, pharyngoesophageal phase 06/12/2014  . GERD 08/02/2007  . HYPERLIPIDEMIA 08/02/2007  . Hyperlipidemia 08/02/2007   Qualifier: Diagnosis of  By: Wynona Luna   . INSOMNIA-SLEEP DISORDER-UNSPEC 01/04/2008  . INTERMITTENT VERTIGO 05/15/2009  . LIBIDO, DECREASED 01/09/2010  . Migraine without aura 05/15/2009   Qualifier: Diagnosis of  By: Jenny Reichmann MD, Hunt Oris   . Nonallopathic lesion of rib cage 12/21/2017  . Nonallopathic lesion of sacral region 07/29/2015  . Nonallopathic lesion of thoracic region 07/29/2015  . Osteoporosis 04/25/2019  . 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 Rheumatoid factor positive 02/10/2016  . Right knee pain 01/31/2019   Injected January 31, 2019  . Type 2 diabetes mellitus with hyperglycemia, with long-term current use of insulin ([place]) 08/02/2007   Qualifier: Diagnosis of  By: Wynona Luna     Past Surgical History:  Procedure Laterality Date  . CESAREAN SECTION     x 3  . CYST EXCISION     left knee  . PAROTIDECTOMY  10/21/2011   Procedure: PAROTIDECTOMY;  Surgeon: Melida Quitter, MD;  Location: [place];  Service: ENT;  Laterality: Left;    There were no vitals filed for this visit.    Subjective Assessment - 02/21/20 1150    Subjective Pt had COVID in July 2021 - pain in neck, HA and dizziness have not subsided since infection.  Pt is also having an issue with orthostatic BP.  Pt is also dealing with significant fatigue - feels like she could stay in bed all day but husband has her get OOB and he helps her with ADLs, showering, getting dressed, household chores, etc.  Not working right now and isn't driving right now.    Patient is accompained by: Family member    Limitations Standing;Walking;House hold activities    Patient Stated Goals To be stronger and be able to do things independently    Currently in Pain? Yes    Pain Score 5     Pain Location Back    Pain Orientation Posterior;Upper;Mid;Lower    Pain Descriptors /  Indicators Constant;Headache    Pain Type Chronic pain    Pain Onset 1 to 4 weeks ago    Pain Frequency Constant              [facility] PT Assessment - 02/21/20 1156      Assessment   Medical Diagnosis Post Covid dizziness, neck pain/HA    Referring Provider (PT) Fenton Foy, NP    Onset Date/Surgical Date 02/20/20    Prior Therapy yes for knee pain      Precautions   Precautions Other (comment)    Precaution Comments allergies, anemia, anxiety, asthma, blood transfusion, cervical radiculopathy, cervicogenic HA, migraine, depression, type 2 DM, dysphagia, GERD, HLD, insomnia, intermittent vertigo, nonallopathic lesions of rib/sacral region/thoracic region, osteoporosis, patellofemoral  syndrome of bilat knees, positive rheumatoid factor      Balance Screen   Has the patient fallen in the past 6 months Yes    How many times? 1    Has the patient had a decrease in activity level because of a fear of falling?  Yes    Is the patient reluctant to leave their home because of a fear of falling?  Yes      [place] residence    Living Arrangements Spouse/significant other;Children    Type of [place] Access Level entry    [place] Two level;Bed/bath upstairs    Alternate Level Stairs-Number of Steps 18    Alternate Level Stairs-Rails Right;Can reach both;Left    Home Equipment None    Additional Comments currently not driving or working.  Husband is helping with ADL and housework.  Husband is a Theme park manager and they have a church in [place]; husband travels to [place] to help with the church.      Prior Function   Level of Independence Independent    Vocation Full time employment    Vocation Requirements works front office at [employer]      Observation/Other Assessments   Focus on Therapeutic Outcomes (FOTO)  48%    Other Surveys  Dizziness Handicap Inventory (DHI)    Dizziness Handicap Inventory (DHI)  68      Sensation   Light Touch Appears Intact      ROM / Strength   AROM / PROM / Strength Strength;AROM      AROM   Overall AROM  Deficits    AROM Assessment Site Cervical    Cervical Flexion 65    Cervical Extension 40    Cervical - Right Side Bend 40    Cervical - Left Side Bend 40    Cervical - Right Rotation 55    Cervical - Left Rotation 55      Strength   Overall Strength Deficits    Overall Strength Comments bilat UE 3+/5, bilat LE: 3+/5                  Vestibular Assessment - 02/21/20 1209      Symptom Behavior   Subjective history of current problem No dizziness sitting at rest; reports nausea and vomiting.  Denies changes in vision.  Pt reports decreased hearing in L ear but no tinnitus.       Type of Dizziness  Spinning    Frequency of Dizziness daily    Duration of Dizziness until she repositions her head    Symptom Nature Motion provoked;Positional    Aggravating Factors Rolling to left;Forward bending   looking  down   Relieving Factors Comments   reposition head/body   Progression of Symptoms No change since onset      Oculomotor Exam   Oculomotor Alignment Normal    Spontaneous Absent    Gaze-induced  Absent    Smooth Pursuits Intact    Saccades Hypometric;Slow      Oculomotor Exam-Fixation Suppressed    Left Head Impulse unable to test, pt closed eyes and reported dizziness    Right Head Impulse unable to test, pt closed eyes and reported dizziness      Vestibulo-Ocular Reflex   VOR to Slow Head Movement Positive bilaterally   unable to maintain gaze   VOR Cancellation Normal      Positional Testing   Dix-Hallpike Dix-Hallpike Right;Dix-Hallpike Left    Horizontal Canal Testing Horizontal Canal Right;Horizontal Canal Left      Dix-Hallpike Right   Dix-Hallpike Right Duration 60 seconds    Dix-Hallpike Right Symptoms Upbeat, right rotatory nystagmus      Dix-Hallpike Left   Dix-Hallpike Left Duration 0    Dix-Hallpike Left Symptoms No nystagmus      Horizontal Canal Right   Horizontal Canal Right Duration 60 seconds    Horizontal Canal Right Symptoms Other (comment)   R rotary     Horizontal Canal Left   Horizontal Canal Left Duration 0    Horizontal Canal Left Symptoms Normal              Objective measurements completed on examination: See above findings.               PT Education - 02/21/20 1244    Education Details clinical findings, PT POC and goals    Person(s) Educated Patient    Methods Explanation    Comprehension Verbalized understanding            PT Short Term Goals - 02/21/20 1258      PT SHORT TERM GOAL #1   Title Pt will participate in further vestibular, balance, gait assessments - MSQ/DVA, gait velocity,  FGA    Time 4    Period Weeks    Status New    Target Date 03/22/20      PT SHORT TERM GOAL #2   Title Pt will be able to tolerate treatment for R posterior canal cupulo with resolution of dizziness with bed mobility.    Time 4    Period Weeks    Status New    Target Date 03/22/20      PT SHORT TERM GOAL #3   Title Pt will initiate HEP focusing on vestibular, neck, balance and strengthening    Time 4    Period Weeks    Status New    Target Date 03/22/20      PT SHORT TERM GOAL #4   Title Pt will report decreased dizziness with bending forwards to allow her to tie her own shoes and don lower body clothing.    Time 4    Period Weeks    Status New    Target Date 03/22/20             PT Long Term Goals - 02/21/20 1302      PT LONG TERM GOAL #1   Title Pt will report increase in FOTO function to >/= 78% and decrease in [place] by 18 points    Baseline FOTO: 48%, DHI: 68    Time 8    Period Weeks    Status New  Target Date 04/21/20      PT LONG TERM GOAL #2   Title Pt will report 0/5 dizziness on MSQ to allow pt to return to full independence with driving, ADL, and housework    Time 8    Period Weeks    Status New    Target Date 04/21/20      PT LONG TERM GOAL #3   Title Pt will demonstrate 4/5 UE and LE strength and demonstrate 10 deg in cervical spine ROM and decrease in neck pain in order to return to driving    Baseline 3+/5, see flow sheet for ROM    Time 8    Period Weeks    Status New    Target Date 04/21/20      PT LONG TERM GOAL #4   Title Pt will demonstrate gait velocity >3.5 ft/sec and increase FGA by 4 points to indicate decreased falls risk    Baseline TBD for both    Time 8    Period Weeks    Status New    Target Date 04/21/20      PT LONG TERM GOAL #5   Title Pt will be independent with final HEP    Time 8    Period Weeks    Status New    Target Date 04/21/20                  Plan - 02/21/20 1246    Clinical Impression  Statement Pt is a 54 year old female referred to Neuro OPPT for evaluation of post COVID dizziness, deconditioning and HA.  Pt's PMH is significant for the following: cervicogenic HA, migraine, depression, type 2 DM, dysphagia, GERD, HLD, insomnia, intermittent vertigo, nonallopathic lesions of rib/sacral region/thoracic region, osteoporosis, patellofemoral syndrome of bilat knees, positive rheumatoid factor. The following deficits were noted during pt's exam: impaired strength in bilat UE and LE, impaired activity tolerance/endurance, neck and back pain with decreased ROM in cervical spine, motion sensitivity, upbeating R rotary nystagmus >60 seconds during R hallpike-dix which may indicate R posterior canal cupulolithiasis vs. L hypofunction - unable to fully assess HIT due to pt closing eyes and guarded neck movement; pt also presents with impaired balance and impaired gait.  Pt would benefit from skilled PT to address these impairments and functional limitations to maximize functional mobility independence and reduce falls risk.    Personal Factors and Comorbidities Comorbidity 3+;Profession;Social Background;Transportation    Comorbidities cervicogenic HA, migraine, depression, type 2 DM, dysphagia, GERD, HLD, insomnia, intermittent vertigo, nonallopathic lesions of rib/sacral region/thoracic region, osteoporosis, patellofemoral syndrome of bilat knees, positive rheumatoid factor    Examination-Activity Limitations Bathing;Bed Mobility;Bend;Dressing;Hygiene/Grooming;Locomotion Level;Sleep;Stairs;Transfers    Examination-Participation Restrictions Cleaning;Community Activity;Driving;Laundry;Meal Prep;Occupation    Stability/Clinical Decision Making Evolving/Moderate complexity    Clinical Decision Making Moderate    Rehab Potential Good    PT Frequency 2x / week    PT Duration 8 weeks    PT Treatment/Interventions ADLs/Self Care Home Management;Aquatic Therapy;Canalith Repostioning;Cryotherapy;Moist  Heat;Gait training;Stair training;Functional mobility training;Therapeutic activities;Therapeutic exercise;Balance training;Neuromuscular re-education;Patient/family education;Manual techniques;Passive range of motion;Dry needling;Vestibular    PT Next Visit Plan reassess and treat R posterior cupulo, assess MSQ/DVA?, initiate HEP.  Need a balance or gait assessment - FGA, gait velocity.  When dizziness has improved also begin to work on endurance, neck ROM/pain, UE and LE strengthening, functional ADL.    Consulted and Agree with Plan of Care Patient           Patient will benefit  from skilled therapeutic intervention in order to improve the following deficits and impairments:  Decreased activity tolerance, Decreased balance, Decreased endurance, Decreased range of motion, Decreased strength, Difficulty walking, Dizziness, Pain  Visit Diagnosis: BPPV (benign paroxysmal positional vertigo), right  Dizziness and giddiness  Unsteadiness on feet  Difficulty in walking, not elsewhere classified  Muscle weakness (generalized)  Cervicalgia     Problem List Patient Active Problem List   Diagnosis Date Noted  . History of COVID-19 02/20/2020  . Fatigue 02/20/2020  . Benign paroxysmal positional vertigo due to bilateral vestibular disorder 02/20/2020  . Physical deconditioning 02/20/2020  . Tachycardia 02/20/2020  . Orthostatic hypotension 02/20/2020  . Myofascial pain syndrome, cervical 02/12/2020  . COVID-19 01/16/2020  . 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 Osteoporosis 04/25/2019  . Right knee pain 01/31/2019  . Anxiety 11/15/2018  . Nonallopathic lesion of rib cage 12/21/2017  . Cervicogenic headache 04/27/2017  . Nonallopathic lesion of cervical region 04/27/2017  . Rheumatoid factor positive 02/10/2016  . Nonallopathic lesion of lumbosacral region 07/29/2015  . Nonallopathic lesion of sacral region 07/29/2015  . Nonallopathic lesion of thoracic region  07/29/2015  . Dysphagia, pharyngoesophageal phase 06/12/2014  . Microcytic anemia 01/21/2011  . Cervical radiculopathy 01/21/2011  . Depression 01/09/2010  . Migraine without aura 05/15/2009  . Vertigo 05/15/2009  . INSOMNIA-SLEEP DISORDER-UNSPEC 01/04/2008  . Type 2 diabetes mellitus with hyperglycemia, with long-term current use of insulin ([place]) 08/02/2007  . Hyperlipidemia 08/02/2007  . Asthma 08/02/2007  . GERD 08/02/2007    Rico Junker, PT, DPT 02/21/20    1:08 PM    [facility] [address] [place], [place], [address] Phone: [phone]   Fax:  [phone]  Name: Rachel [name] MRN: [id number] Date of Birth: 09-29-1965

## 2020-02-22 ENCOUNTER — Telehealth: Payer: Self-pay | Admitting: Nurse Practitioner

## 2020-02-22 NOTE — Telephone Encounter (Signed)
Patient notified of results, verbally understood. No additional questions.

## 2020-02-22 NOTE — Telephone Encounter (Signed)
-----   Message from Fenton Foy, NP sent at 02/21/2020  4:01 PM EDT ----- Please call to let patient know that her labs were normal other than glucose still elevated. Patient can follow up with her PCP regarding elevated blood glucose level.

## 2020-02-25 ENCOUNTER — Other Ambulatory Visit: Payer: Self-pay

## 2020-02-25 ENCOUNTER — Ambulatory Visit: Payer: 59 | Admitting: Physical Therapy

## 2020-02-25 ENCOUNTER — Encounter: Payer: Self-pay | Admitting: Physical Therapy

## 2020-02-25 DIAGNOSIS — H8111 Benign paroxysmal vertigo, right ear: Secondary | ICD-10-CM

## 2020-02-25 DIAGNOSIS — M6281 Muscle weakness (generalized): Secondary | ICD-10-CM | POA: Diagnosis not present

## 2020-02-25 DIAGNOSIS — R42 Dizziness and giddiness: Secondary | ICD-10-CM

## 2020-02-25 DIAGNOSIS — R262 Difficulty in walking, not elsewhere classified: Secondary | ICD-10-CM | POA: Diagnosis not present

## 2020-02-25 DIAGNOSIS — H8112 Benign paroxysmal vertigo, left ear: Secondary | ICD-10-CM | POA: Diagnosis not present

## 2020-02-25 DIAGNOSIS — R2681 Unsteadiness on feet: Secondary | ICD-10-CM

## 2020-02-25 DIAGNOSIS — M542 Cervicalgia: Secondary | ICD-10-CM | POA: Diagnosis not present

## 2020-02-25 NOTE — Therapy (Signed)
[facility] [address] [place], [place], [address] Phone: [phone]   Fax:  [phone]  Physical Therapy Treatment  Patient Details  Name: Rachel [name] MRN: [id number] Date of Birth: [birth year] Referring Provider (PT): Fenton Foy, NP   Encounter Date: 02/25/2020   PT End of Session - 02/25/20 1416    Visit Number 2    Number of Visits 17    Date for PT Re-Evaluation 04/21/20    Authorization Type [facility]; VL: [place]    PT Start Time 1015    PT Stop Time 1107    PT Time Calculation (min) 52 min    Activity Tolerance Patient tolerated treatment well    Behavior During Therapy [facility] for tasks assessed/performed           Past Medical History:  Diagnosis Date  . ALLERGIC RHINITIS 10/04/2007  . Anemia 01/21/2011  . Anxiety 11/15/2018  . ASTHMA 08/02/2007   INHALERS ONLY IN [place]  . Asthma 08/02/2007   Qualifier: Diagnosis of  By: Wynona Luna   . Blood transfusion without reported diagnosis    with first child   . Cervical radiculopathy 01/21/2011  . Cervicogenic headache 04/27/2017  . COMMON MIGRAINE 05/15/2009  . Depression 01/09/2010   Qualifier: Diagnosis of  By: Jenny Reichmann MD, Hunt Oris   . DIABETES MELLITUS, TYPE II 08/02/2007  . Dysphagia, pharyngoesophageal phase 06/12/2014  . GERD 08/02/2007  . HYPERLIPIDEMIA 08/02/2007  . Hyperlipidemia 08/02/2007   Qualifier: Diagnosis of  By: Wynona Luna   . INSOMNIA-SLEEP DISORDER-UNSPEC 01/04/2008  . INTERMITTENT VERTIGO 05/15/2009  . LIBIDO, DECREASED 01/09/2010  . Migraine without aura 05/15/2009   Qualifier: Diagnosis of  By: Jenny Reichmann MD, Hunt Oris   . Nonallopathic lesion of rib cage 12/21/2017  . Nonallopathic lesion of sacral region 07/29/2015  . Nonallopathic lesion of thoracic region 07/29/2015  . Osteoporosis 04/25/2019  . 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 Rheumatoid factor positive 02/10/2016  . Right knee pain 01/31/2019   Injected January 31, 2019  . Type 2 diabetes mellitus with hyperglycemia, with long-term current use of insulin ([place]) 08/02/2007   Qualifier: Diagnosis of  By: Wynona Luna     Past Surgical History:  Procedure Laterality Date  . CESAREAN SECTION     x 3  . CYST EXCISION     left knee  . PAROTIDECTOMY  10/21/2011   Procedure: PAROTIDECTOMY;  Surgeon: Melida Quitter, MD;  Location: [facility];  Service: ENT;  Laterality: Left;    There were no vitals filed for this visit.   Subjective Assessment - 02/25/20 1019    Subjective Pt reports trying to do more this weekend, dressed herself, did light cooking, did the dishes and put some dishes in the dishwasher.  Was dizzy and fatigued afterwards but if she sits down she feels better after about a minute.  Feels more dizzy lying down to R side.    Patient is accompained by: Family member    Limitations Standing;Walking;House hold activities    Patient Stated Goals To be stronger and be able to do things independently    Currently in Pain? Yes    Pain Score 5     Pain Location Head    Pain Orientation Posterior    Pain Descriptors / Indicators Headache    Pain Radiating Towards from occiput to R eye    Pain Onset Today  [facility] PT Assessment - 02/25/20 1033      Palpation   Palpation comment Tenderness to palpation along cervical paraspinals and suboccipital muscles bilaterally.  Mild tenderness in upper trapezius               Vestibular Assessment - 02/25/20 1034      Positional Testing   Sidelying Test Sidelying Right      Sidelying Right   Sidelying Right Duration 60 seconds    Sidelying Right Symptoms Downbeat, right rotatory nystagmus                     Vestibular Treatment/Exercise - 02/25/20 1027      Vestibular Treatment/Exercise   Vestibular Treatment Provided Canalith Repositioning    Canalith Repositioning Semont Procedure Right Posterior;Comment;Semont Procedure Right Anterior      Semont Procedure  Right Anterior   Number of Reps  1    Response Details  combined with repeated head turns and bone vibration      Semont Procedure Right Posterior   Number of Reps  1    Overall Response  No change    Response Details  Performed with 20 reps fast head turns and 20 seconds of bone vibration on R to loosen otoconia on cupula; nystagmus changed from upbeating to downbeating but duration remained the same      OTHER   Comment Due to change to downbeat nystagmus changed to deep head hanging maneuver but continued to use repeated head turns and vibration to address cupulolithiasis.  After one manuever of deep head hang, no change in duration or direction of nystagmus.  Performed x 2                     PT Education - 02/25/20 1415    Education Details transition to downbeating nystagmus; model of vestibular canal and mechanism of BPPV    Person(s) Educated Patient    Methods Explanation;Demonstration    Comprehension Verbalized understanding            PT Short Term Goals - 02/21/20 1258      PT SHORT TERM GOAL #1   Title Pt will participate in further vestibular, balance, gait assessments - MSQ/DVA, gait velocity, FGA    Time 4    Period Weeks    Status New    Target Date 03/22/20      PT SHORT TERM GOAL #2   Title Pt will be able to tolerate treatment for R posterior canal cupulo with resolution of dizziness with bed mobility.    Time 4    Period Weeks    Status New    Target Date 03/22/20      PT SHORT TERM GOAL #3   Title Pt will initiate HEP focusing on vestibular, neck, balance and strengthening    Time 4    Period Weeks    Status New    Target Date 03/22/20      PT SHORT TERM GOAL #4   Title Pt will report decreased dizziness with bending forwards to allow her to tie her own shoes and don lower body clothing.    Time 4    Period Weeks    Status New    Target Date 03/22/20             PT Long Term Goals - 02/21/20 1302      PT LONG TERM GOAL #1    Title Pt will report increase in  FOTO function to >/= 78% and decrease in [place] by 18 points    Baseline FOTO: 48%, DHI: 68    Time 8    Period Weeks    Status New    Target Date 04/21/20      PT LONG TERM GOAL #2   Title Pt will report 0/5 dizziness on MSQ to allow pt to return to full independence with driving, ADL, and housework    Time 8    Period Weeks    Status New    Target Date 04/21/20      PT LONG TERM GOAL #3   Title Pt will demonstrate 4/5 UE and LE strength and demonstrate 10 deg in cervical spine ROM and decrease in neck pain in order to return to driving    Baseline 3+/5, see flow sheet for ROM    Time 8    Period Weeks    Status New    Target Date 04/21/20      PT LONG TERM GOAL #4   Title Pt will demonstrate gait velocity >3.5 ft/sec and increase FGA by 4 points to indicate decreased falls risk    Baseline TBD for both    Time 8    Period Weeks    Status New    Target Date 04/21/20      PT LONG TERM GOAL #5   Title Pt will be independent with final HEP    Time 8    Period Weeks    Status New    Target Date 04/21/20                 Plan - 02/25/20 1416    Clinical Impression Statement Pt continues to report dizziness with lying down to R side for >1 minute.  Treated x 1 with Semont mauever with pt's nystagmus converting to R rotary, downbeating for >60 seconds indicating possible long arm of R posterior canal or anterior canal involvement.  Performed 2 reps of deep head hang with repeated head turns and bone vibration with no change in symptoms.  Also performed 1 rep of semont for anterior canal (head turned to R).  Will attempt to bring pt in for second session this week to continue to address.  Symptoms no worse at end of session.    Personal Factors and Comorbidities Comorbidity 3+;Profession;Social Background;Transportation    Comorbidities cervicogenic HA, migraine, depression, type 2 DM, dysphagia, GERD, HLD, insomnia, intermittent vertigo,  nonallopathic lesions of rib/sacral region/thoracic region, osteoporosis, patellofemoral syndrome of bilat knees, positive rheumatoid factor    Examination-Activity Limitations Bathing;Bed Mobility;Bend;Dressing;Hygiene/Grooming;Locomotion Level;Sleep;Stairs;Transfers    Examination-Participation Restrictions Cleaning;Community Activity;Driving;Laundry;Meal Prep;Occupation    Stability/Clinical Decision Making Evolving/Moderate complexity    Rehab Potential Good    PT Frequency 2x / week    PT Duration 8 weeks    PT Treatment/Interventions ADLs/Self Care Home Management;Aquatic Therapy;Canalith Repostioning;Cryotherapy;Moist Heat;Gait training;Stair training;Functional mobility training;Therapeutic activities;Therapeutic exercise;Balance training;Neuromuscular re-education;Patient/family education;Manual techniques;Passive range of motion;Dry needling;Vestibular    PT Next Visit Plan reassess and treat R cupulo with reverse Dix-hallpike?  Anterior semont?, assess MSQ/DVA?, initiate HEP.  Need a balance or gait assessment - FGA, gait velocity.  When dizziness has improved also begin to work on endurance, neck ROM/pain, UE and LE strengthening, functional ADL.    Consulted and Agree with Plan of Care Patient           Patient will benefit from skilled therapeutic intervention in order to improve the following deficits and impairments:  Decreased activity  tolerance, Decreased balance, Decreased endurance, Decreased range of motion, Decreased strength, Difficulty walking, Dizziness, Pain  Visit Diagnosis: BPPV (benign paroxysmal positional vertigo), right  Dizziness and giddiness  Unsteadiness on feet  Difficulty in walking, not elsewhere classified     Problem List Patient Active Problem List   Diagnosis Date Noted  . History of COVID-19 02/20/2020  . Fatigue 02/20/2020  . Benign paroxysmal positional vertigo due to bilateral vestibular disorder 02/20/2020  . Physical deconditioning  02/20/2020  . Tachycardia 02/20/2020  . Orthostatic hypotension 02/20/2020  . Myofascial pain syndrome, cervical 02/12/2020  . COVID-19 01/16/2020  . 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 Osteoporosis 04/25/2019  . Right knee pain 01/31/2019  . Anxiety 11/15/2018  . Nonallopathic lesion of rib cage 12/21/2017  . Cervicogenic headache 04/27/2017  . Nonallopathic lesion of cervical region 04/27/2017  . Rheumatoid factor positive 02/10/2016  . Nonallopathic lesion of lumbosacral region 07/29/2015  . Nonallopathic lesion of sacral region 07/29/2015  . Nonallopathic lesion of thoracic region 07/29/2015  . Dysphagia, pharyngoesophageal phase 06/12/2014  . Microcytic anemia 01/21/2011  . Cervical radiculopathy 01/21/2011  . Depression 01/09/2010  . Migraine without aura 05/15/2009  . Vertigo 05/15/2009  . INSOMNIA-SLEEP DISORDER-UNSPEC 01/04/2008  . Type 2 diabetes mellitus with hyperglycemia, with long-term current use of insulin ([place]) 08/02/2007  . Hyperlipidemia 08/02/2007  . Asthma 08/02/2007  . GERD 08/02/2007   Rico Junker, PT, DPT 02/25/20    2:24 PM    [facility] [address] [place], [place], [address] Phone: [phone]   Fax:  [phone]  Name: Dymphna Wadley MRN: [id number] Date of Birth: [birth year]

## 2020-02-26 ENCOUNTER — Ambulatory Visit: Payer: 59 | Admitting: Internal Medicine

## 2020-02-26 NOTE — Progress Notes (Deleted)
Cardiology Office Note:    Date:  02/26/2020   ID:  Rachel [name], DOB 02-Jun-1966, MRN [id number]  PCP:  Rachel Borg, MD  [facility] Cardiologist:  No primary care provider on file.  [facility] Electrophysiologist:  None   Referring MD: Rachel Foy, NP   CC: Othostatic hypotension Seen for consultation of hypotension at the behest of Dr. Cathlean Cower.  History of Present Illness:    Rachel [name] is a 54 y.o. female with a hx of T2DM without Insulin, Asthma, Anxiety, recent COVID-19 infection, who presents for the evaluation of orthostatic hypotension.  Past Medical History:  Diagnosis Date  . ALLERGIC RHINITIS 10/04/2007  . Anemia 01/21/2011  . Anxiety 11/15/2018  . ASTHMA 08/02/2007   INHALERS ONLY IN [place]  . Asthma 08/02/2007   Qualifier: Diagnosis of  By: Wynona Luna   . Blood transfusion without reported diagnosis    with first child   . Cervical radiculopathy 01/21/2011  . Cervicogenic headache 04/27/2017  . COMMON MIGRAINE 05/15/2009  . Depression 01/09/2010   Qualifier: Diagnosis of  By: Jenny Reichmann MD, Hunt Oris   . DIABETES MELLITUS, TYPE II 08/02/2007  . Dysphagia, pharyngoesophageal phase 06/12/2014  . GERD 08/02/2007  . HYPERLIPIDEMIA 08/02/2007  . Hyperlipidemia 08/02/2007   Qualifier: Diagnosis of  By: Wynona Luna   . INSOMNIA-SLEEP DISORDER-UNSPEC 01/04/2008  . INTERMITTENT VERTIGO 05/15/2009  . LIBIDO, DECREASED 01/09/2010  . Migraine without aura 05/15/2009   Qualifier: Diagnosis of  By: Jenny Reichmann MD, Hunt Oris   . Nonallopathic lesion of rib cage 12/21/2017  . Nonallopathic lesion of sacral region 07/29/2015  . Nonallopathic lesion of thoracic region 07/29/2015  . Osteoporosis 04/25/2019  . 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 Rheumatoid factor positive 02/10/2016  . Right knee pain 01/31/2019   Injected January 31, 2019  . Type 2 diabetes mellitus with hyperglycemia, with long-term current use of insulin ([place]) 08/02/2007   Qualifier:  Diagnosis of  By: Wynona Luna     Past Surgical History:  Procedure Laterality Date  . CESAREAN SECTION     x 3  . CYST EXCISION     left knee  . PAROTIDECTOMY  10/21/2011   Procedure: PAROTIDECTOMY;  Surgeon: Melida Quitter, MD;  Location: [facility] OR;  Service: ENT;  Laterality: Left;    Current Medications: No outpatient medications have been marked as taking for the 02/26/20 encounter (Appointment) with Werner Lean, MD.     Allergies:   Lipitor [atorvastatin] and Nitroglycerin   Social History   Socioeconomic History  . Marital status: Married    Spouse name: Rachel [name]  . Number of children: 3  . Years of education: Degree  . Highest education level: Not on file  Occupational History  . 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 Occupation: Chartered certified accountant    Employer: Rachel [name]  Tobacco Use  . Smoking status: Never Smoker  . Smokeless tobacco: Never Used  Vaping Use  . Vaping Use: Never used  Substance and Sexual Activity  . Alcohol use: No    Alcohol/week: 0.0 standard drinks  . Drug use: No  . Sexual activity: Not on file  Other Topics Concern  . Not on file  Social History Narrative   She has 3 daughters.   Patient has a 4 year degree.    Patient working at [employer].    Patient is married to [place].  Social Determinants of Health   Financial Resource Strain:   . Difficulty of Paying Living Expenses: Not on file  Food Insecurity:   . Worried About Charity fundraiser in the Last Year: Not on file  . Ran Out of Food in the Last Year: Not on file  Transportation Needs:   . Lack of Transportation (Medical): Not on file  . Lack of Transportation (Non-Medical): Not on file  Physical Activity:   . Days of Exercise per Week: Not on file  . Minutes of Exercise per Session: Not on file  Stress:   . Feeling of Stress : Not on file  Social Connections:   . Frequency of Communication with Friends and Family: Not on file  . Frequency  of Social Gatherings with Friends and Family: Not on file  . Attends Religious Services: Not on file  . Active Member of Clubs or Organizations: Not on file  . Attends Archivist Meetings: Not on file  . Marital Status: Not on file     Family History: The patient's ***family history includes Asthma in her father; Cervical cancer in her maternal aunt; Diabetes in her father; Heart disease in her maternal aunt; Hypertension in her father. There is no history of Anesthesia problems, Colon cancer, or Breast cancer.  ROS:   Please see the history of present illness.    *** All other systems reviewed and are negative.  EKGs/Labs/Other Studies Reviewed:    The following studies were reviewed today: ***  EKG:  EKG is *** ordered today.  The ekg ordered today demonstrates *** EKG 01/21/20- sinus rhythm rate 93, low voltage (limb lead) 11/26/17- sinus tach rate 100, low voltage (limb lead)  Recent Labs: 02/20/2020: ALT 14; BUN 8; Creatinine, Ser 0.76; Hemoglobin 12.4; Platelets 367; Potassium 4.9; Sodium 136; TSH 1.150  Recent Lipid Panel    Component Value Date/Time   CHOL 189 08/29/2019 1200   TRIG 88.0 08/29/2019 1200   HDL 64.10 08/29/2019 1200   CHOLHDL 3 08/29/2019 1200   VLDL 17.6 08/29/2019 1200   LDLCALC 108 (H) 08/29/2019 1200   LDLDIRECT 191.0 08/23/2018 1516   06/28/2012- MPI statis images:  No perfusion defect  Physical Exam:    VS:  There were no vitals taken for this visit.    Wt Readings from Last 3 Encounters:  02/20/20 136 lb 0.1 oz (61.7 kg)  01/21/20 136 lb 14.5 oz (62.1 kg)  10/15/19 147 lb 0.8 oz (66.7 kg)     GEN: *** Well nourished, well developed in no acute distress HEENT: Normal NECK: No JVD; No carotid bruits LYMPHATICS: No lymphadenopathy CARDIAC: ***RRR, no murmurs, rubs, gallops RESPIRATORY:  Clear to auscultation without rales, wheezing or rhonchi  ABDOMEN: Soft, non-tender, non-distended MUSCULOSKELETAL:  No edema; No deformity  SKIN:  Warm and dry NEUROLOGIC:  Alert and oriented x 3 PSYCHIATRIC:  Normal affect   ASSESSMENT:    No diagnosis found. PLAN:    In order of problems listed above:  1. *** 2. Hyperlipidemia- with lipitor myaglias; will trial ***   Medication Adjustments/Labs and Tests Ordered: Current medicines are reviewed at length with the patient today.  Concerns regarding medicines are outlined above.  No orders of the defined types were placed in this encounter.  No orders of the defined types were placed in this encounter.   There are no Patient Instructions on file for this visit.   Signed, Werner Lean, MD  02/26/2020 9:16 AM    [facility]

## 2020-02-27 ENCOUNTER — Encounter: Payer: Self-pay | Admitting: Physical Therapy

## 2020-02-27 ENCOUNTER — Ambulatory Visit: Payer: 59 | Admitting: Physical Therapy

## 2020-02-27 ENCOUNTER — Other Ambulatory Visit: Payer: Self-pay

## 2020-02-27 DIAGNOSIS — M542 Cervicalgia: Secondary | ICD-10-CM

## 2020-02-27 DIAGNOSIS — R262 Difficulty in walking, not elsewhere classified: Secondary | ICD-10-CM | POA: Diagnosis not present

## 2020-02-27 DIAGNOSIS — M6281 Muscle weakness (generalized): Secondary | ICD-10-CM | POA: Diagnosis not present

## 2020-02-27 DIAGNOSIS — H8112 Benign paroxysmal vertigo, left ear: Secondary | ICD-10-CM | POA: Diagnosis not present

## 2020-02-27 DIAGNOSIS — R2681 Unsteadiness on feet: Secondary | ICD-10-CM | POA: Diagnosis not present

## 2020-02-27 DIAGNOSIS — H8111 Benign paroxysmal vertigo, right ear: Secondary | ICD-10-CM

## 2020-02-27 DIAGNOSIS — R42 Dizziness and giddiness: Secondary | ICD-10-CM | POA: Diagnosis not present

## 2020-02-27 NOTE — Therapy (Signed)
[facility] [address] [place], [place], [address] Phone: [phone]   Fax:  [phone]  Physical Therapy Treatment  Patient Details  Name: Rachel [name] MRN: [id number] Date of Birth: [birth year] Referring Provider (PT): Fenton Foy, NP   Encounter Date: 02/27/2020   PT End of Session - 02/27/20 1116    Visit Number 3    Number of Visits 17    Date for PT Re-Evaluation 04/21/20    Authorization Type [facility]; VL: [place]    PT Start Time 1015    PT Stop Time 1100    PT Time Calculation (min) 45 min    Activity Tolerance Patient tolerated treatment well    Behavior During Therapy [facility] for tasks assessed/performed           Past Medical History:  Diagnosis Date   ALLERGIC RHINITIS 10/04/2007   Anemia 01/21/2011   Anxiety 11/15/2018   ASTHMA 08/02/2007   INHALERS ONLY IN [place]   Asthma 08/02/2007   Qualifier: Diagnosis of  By: Wynona Luna    Blood transfusion without reported diagnosis    with first child    Cervical radiculopathy 01/21/2011   Cervicogenic headache 04/27/2017   COMMON MIGRAINE 05/15/2009   Depression 01/09/2010   Qualifier: Diagnosis of  By: Jenny Reichmann MD, [place], TYPE II 08/02/2007   Dysphagia, pharyngoesophageal phase 06/12/2014   GERD 08/02/2007   HYPERLIPIDEMIA 08/02/2007   Hyperlipidemia 08/02/2007   Qualifier: Diagnosis of  By: Wynona Luna    INSOMNIA-SLEEP DISORDER-UNSPEC 01/04/2008   INTERMITTENT VERTIGO 05/15/2009   LIBIDO, DECREASED 01/09/2010   Migraine without aura 05/15/2009   Qualifier: Diagnosis of  By: Jenny Reichmann MD, Hunt Oris    Nonallopathic lesion of rib cage 12/21/2017   Nonallopathic lesion of sacral region 07/29/2015   Nonallopathic lesion of thoracic region 07/29/2015   Osteoporosis 04/25/2019   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   Rheumatoid factor positive 02/10/2016   Right knee pain 01/31/2019   Injected January 31, 2019   Type 2 diabetes mellitus with hyperglycemia, with long-term current use of insulin ([place]) 08/02/2007   Qualifier: Diagnosis of  By: Wynona Luna     Past Surgical History:  Procedure Laterality Date   CESAREAN SECTION     x 3   CYST EXCISION     left knee   PAROTIDECTOMY  10/21/2011   Procedure: PAROTIDECTOMY;  Surgeon: Melida Quitter, MD;  Location: [facility];  Service: ENT;  Laterality: Left;    There were no vitals filed for this visit.   Subjective Assessment - 02/27/20 1020    Subjective Feeling okay, a little off balance when first standing up.  No change in dizziness.    Patient is accompained by: Family member    Limitations Standing;Walking;House hold activities    Patient Stated Goals To be stronger and be able to do things independently    Currently in Pain? Yes    Pain Onset Today                   Vestibular Assessment - 02/27/20 1021      Other Tests   Comments Deep head hang test: R rotary, downbeating nystagmus of long duration.      Positional Testing   Sidelying Test Sidelying Right      Sidelying Right   Sidelying Right Duration 60 seconds    Sidelying Right Symptoms Downbeat, right  rotatory nystagmus   after 3 reps deep head hang, dec after forward 360 roll                    Vestibular Treatment/Exercise - 02/27/20 1031      Vestibular Treatment/Exercise   Vestibular Treatment Provided Canalith Repositioning    Canalith Repositioning Comment      OTHER   Comment Deep head hang for neck extension performed with vibration to R side x 3 reps with 2 minute hold.  Then performed Forward 360 roll for anterior canal on R with use of vibration, x 2 reps.                 PT Education - 02/27/20 1115    Education Details educated pt on anterior canal orientation and specific canalith repositioning maneuvers to clear.  Encouraged to continue graded exposure to household activities and ADL    Person(s) Educated  Patient    Methods Explanation;Demonstration    Comprehension Verbalized understanding            PT Short Term Goals - 02/21/20 1258      PT SHORT TERM GOAL #1   Title Pt will participate in further vestibular, balance, gait assessments - MSQ/DVA, gait velocity, FGA    Time 4    Period Weeks    Status New    Target Date 03/22/20      PT SHORT TERM GOAL #2   Title Pt will be able to tolerate treatment for R posterior canal cupulo with resolution of dizziness with bed mobility.    Time 4    Period Weeks    Status New    Target Date 03/22/20      PT SHORT TERM GOAL #3   Title Pt will initiate HEP focusing on vestibular, neck, balance and strengthening    Time 4    Period Weeks    Status New    Target Date 03/22/20      PT SHORT TERM GOAL #4   Title Pt will report decreased dizziness with bending forwards to allow her to tie her own shoes and don lower body clothing.    Time 4    Period Weeks    Status New    Target Date 03/22/20             PT Long Term Goals - 02/21/20 1302      PT LONG TERM GOAL #1   Title Pt will report increase in FOTO function to >/= 78% and decrease in [place] by 18 points    Baseline FOTO: 48%, DHI: 68    Time 8    Period Weeks    Status New    Target Date 04/21/20      PT LONG TERM GOAL #2   Title Pt will report 0/5 dizziness on MSQ to allow pt to return to full independence with driving, ADL, and housework    Time 8    Period Weeks    Status New    Target Date 04/21/20      PT LONG TERM GOAL #3   Title Pt will demonstrate 4/5 UE and LE strength and demonstrate 10 deg in cervical spine ROM and decrease in neck pain in order to return to driving    Baseline 3+/5, see flow sheet for ROM    Time 8    Period Weeks    Status New    Target Date 04/21/20  PT LONG TERM GOAL #4   Title Pt will demonstrate gait velocity >3.5 ft/sec and increase FGA by 4 points to indicate decreased falls risk    Baseline TBD for both    Time 8     Period Weeks    Status New    Target Date 04/21/20      PT LONG TERM GOAL #5   Title Pt will be independent with final HEP    Time 8    Period Weeks    Status New    Target Date 04/21/20                 Plan - 02/27/20 1116    Clinical Impression Statement Re-tested R BPPV with no change in symptoms.  Performed 3 reps of deep head hang with vibration with 2 minute hold with no change in symptoms.  Changed to Forward/Prone 360 roll for R anterior canal and performed x 2 reps with vibration.  When re-assessed at end of session pt demonstrated shorter duration of nystagmus and decreased downbeating noted.  Will continue to assess and address and indicated.    Personal Factors and Comorbidities Comorbidity 3+;Profession;Social Background;Transportation    Comorbidities cervicogenic HA, migraine, depression, type 2 DM, dysphagia, GERD, HLD, insomnia, intermittent vertigo, nonallopathic lesions of rib/sacral region/thoracic region, osteoporosis, patellofemoral syndrome of bilat knees, positive rheumatoid factor    Examination-Activity Limitations Bathing;Bed Mobility;Bend;Dressing;Hygiene/Grooming;Locomotion Level;Sleep;Stairs;Transfers    Examination-Participation Restrictions Cleaning;Community Activity;Driving;Laundry;Meal Prep;Occupation    Stability/Clinical Decision Making Evolving/Moderate complexity    Rehab Potential Good    PT Frequency 2x / week    PT Duration 8 weeks    PT Treatment/Interventions ADLs/Self Care Home Management;Aquatic Therapy;Canalith Repostioning;Cryotherapy;Moist Heat;Gait training;Stair training;Functional mobility training;Therapeutic activities;Therapeutic exercise;Balance training;Neuromuscular re-education;Patient/family education;Manual techniques;Passive range of motion;Dry needling;Vestibular    PT Next Visit Plan reassess and treat R cupulo with forward/prone 360 roll.  assess MSQ/DVA?, initiate HEP.  Need a balance or gait assessment - FGA, gait  velocity.  When dizziness has improved also begin to work on endurance, neck ROM/pain, UE and LE strengthening, functional ADL.    Consulted and Agree with Plan of Care Patient           Patient will benefit from skilled therapeutic intervention in order to improve the following deficits and impairments:  Decreased activity tolerance, Decreased balance, Decreased endurance, Decreased range of motion, Decreased strength, Difficulty walking, Dizziness, Pain  Visit Diagnosis: BPPV (benign paroxysmal positional vertigo), right  Dizziness and giddiness  Unsteadiness on feet  Difficulty in walking, not elsewhere classified  Cervicalgia     Problem List Patient Active Problem List   Diagnosis Date Noted   History of COVID-19 02/20/2020   Fatigue 02/20/2020   Benign paroxysmal positional vertigo due to bilateral vestibular disorder 02/20/2020   Physical deconditioning 02/20/2020   Tachycardia 02/20/2020   Orthostatic hypotension 02/20/2020   Myofascial pain syndrome, cervical 02/12/2020   COVID-19 01/16/2020   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   Osteoporosis 04/25/2019   Right knee pain 01/31/2019   Anxiety 11/15/2018   Nonallopathic lesion of rib cage 12/21/2017   Cervicogenic headache 04/27/2017   Nonallopathic lesion of cervical region 04/27/2017   Rheumatoid factor positive 02/10/2016   Nonallopathic lesion of lumbosacral region 07/29/2015   Nonallopathic lesion of sacral region 07/29/2015   Nonallopathic lesion of thoracic region 07/29/2015   Dysphagia, pharyngoesophageal phase 06/12/2014   Microcytic anemia 01/21/2011   Cervical radiculopathy 01/21/2011   Depression 01/09/2010   Migraine without aura 05/15/2009  Vertigo 05/15/2009   INSOMNIA-SLEEP DISORDER-UNSPEC 01/04/2008   Type 2 diabetes mellitus with hyperglycemia, with long-term current use of insulin ([place]) 08/02/2007   Hyperlipidemia 08/02/2007   Asthma 08/02/2007    GERD 08/02/2007    Rico Junker, PT, DPT 02/27/20    11:20 AM    [facility] [address] [place], [place], [address] Phone: [phone]   Fax:  [phone]  Name: Rachel [name] MRN: [id number] Date of Birth: [birth year]

## 2020-02-28 NOTE — Progress Notes (Deleted)
NEUROLOGY FOLLOW UP OFFICE NOTE  Druanne Bosques [id number]  HISTORY OF PRESENT ILLNESS: Jaleigh Mccroskey a 54 year old right-handed female with type 2 diabetes, hyperlipidemia, GERD, asthma, and migraine who follows up for migraines.  UPDATE: She had COVID last month, followed by BPPV.  *** *** Current NSAIDS:none Current analgesics:Excedrin Migraine, Tramadol (for back pain, ineffective for headache) Current triptans:None Current ergotamine:None Current anti-emetic:Reglan 10mg  Current muscle relaxants:Flexeril Current anti-anxiolytic:None Current sleep aide:None Current Antihypertensive medications:None Current Antidepressant medications:none Current Anticonvulsant medications:Gabapentin 300 mgtwice daily(for back pain) Current anti-CGRP:Emgality, Nurtec Current Vitamins/Herbal/Supplements:Turmeric Current Antihistamines/Decongestants:None Other therapy:She continues to see Dr. [name] for OMT regarding chronic neck with cervical radiculopathy and back pain.  Caffeine:One cup of coffee daily Diet:Drinks plenty of water. No soda. Exercise:No Depression:No; Anxiety:yes. Her mother has been ill. She has been confused.  Other pain:Knee pain. Sleep hygiene:Poor. Worrying about her mother.  HISTORY: Onset:2011.She does have remote history of headaches when she was younger. Location:Back of head and radiates to the front Quality:Shooting up from back of head, pounding on top and front of head Initial Intensity:8/10 Aura:no Prodrome:no Associated symptoms:Nausea, photophobia, phonophobia, blurred vision (she gets annual eye exams due to diabetes) Initial Duration:All day Initial Frequency:Almost daily (cannot function 6 days per month) Triggers/exacerbating factors:wine Relieving factors:No Activity:Cannot work about 6 days per month (she works as an Advertising account planner)  Past NSAIDS:Cambia (made headache  worse),flurbiprofen,Sprix nasal spray, ibuprofen; naproxen Past analgesics:Fioricet, Excedrin, Tylenol Past abortive triptans:Sumatriptan 100 mg, Zembrace-SymTouch (did not like the feeling), Zomig 5 mg NS (did not like the way it made her feel), Maxalt (dizziness, ineffective) Past abortive ergotamine:Cafergot Past muscle relaxants:Robaxin Past anti-emetic:Zofran 8mg  Past antihypertensive medications:Atenolol Past antidepressant medications:Venlafaxine Exar 150 mg; Cymbalta 20mg  daily Past anticonvulsant medications:Topiramate, Depakote Past anti-CGRP:Aimovig 140mg  Past vitamins/Herbal/Supplements:None Past antihistamines/decongestants:None Other past therapies:Botox  Frequent Falls: For the past few years, she has had falls, but they have become more frequent over the past year. Previously, she was diagnosed with vertigo. However, she reports that she doesn't note spinning or lightheadedness. She says she "just falls". On one occasion, she was pushing the seat forward while sitting and just fell over. Otherwise, it always occurs while walking or on her feet. She denies double vision or focal numbness or weakness. MRI of the brain with seizure protocol was performed on 08/02/11, which was unremarkable. Sleep deprived EEG was normal. She has had PT, which was ineffective. She does report that she sometimes trips while walking but doesn't fall. Cervical plain films from 05/07/15 showed mild degenerative disc disease at C5-6, but otherwise unremarkable. Lumbar films from 06/10/15 were personally reviewed and were negative. She underwent anothner MRI of brain with and without contrast on 02/10/16, and was stable compared to prior MRI from 2013. NCV-EMG from 05/13/16 was negative for neuropathy or lumbosacral radiculopathy.  PAST MEDICAL HISTORY: Past Medical History:  Diagnosis Date  . ALLERGIC RHINITIS 10/04/2007  . Anemia 01/21/2011  . Anxiety 11/15/2018  . ASTHMA  08/02/2007   INHALERS ONLY IN [place]  . Asthma 08/02/2007   Qualifier: Diagnosis of  By: Wynona Luna   . Blood transfusion without reported diagnosis    with first child   . Cervical radiculopathy 01/21/2011  . Cervicogenic headache 04/27/2017  . COMMON MIGRAINE 05/15/2009  . Depression 01/09/2010   Qualifier: Diagnosis of  By: Jenny Reichmann MD, Hunt Oris   . DIABETES MELLITUS, TYPE II 08/02/2007  . Dysphagia, pharyngoesophageal phase 06/12/2014  . GERD 08/02/2007  . HYPERLIPIDEMIA 08/02/2007  . Hyperlipidemia 08/02/2007  Qualifier: Diagnosis of  By: Wynona Luna   . INSOMNIA-SLEEP DISORDER-UNSPEC 01/04/2008  . INTERMITTENT VERTIGO 05/15/2009  . LIBIDO, DECREASED 01/09/2010  . Migraine without aura 05/15/2009   Qualifier: Diagnosis of  By: Jenny Reichmann MD, Hunt Oris   . Nonallopathic lesion of rib cage 12/21/2017  . Nonallopathic lesion of sacral region 07/29/2015  . Nonallopathic lesion of thoracic region 07/29/2015  . Osteoporosis 04/25/2019  . 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 Rheumatoid factor positive 02/10/2016  . Right knee pain 01/31/2019   Injected January 31, 2019  . Type 2 diabetes mellitus with hyperglycemia, with long-term current use of insulin ([place]) 08/02/2007   Qualifier: Diagnosis of  By: Wynona Luna     MEDICATIONS: Current Outpatient Medications on File Prior to Visit  Medication Sig Dispense Refill  . albuterol (VENTOLIN HFA) 108 (90 Base) MCG/ACT inhaler Inhale 2 puffs into the lungs every 6 (six) hours as needed for wheezing or shortness of breath. 18 g 5  . aspirin 81 MG EC tablet Take 81 mg by mouth daily.      Marland Kitchen atenolol (TENORMIN) 25 MG tablet Take 1 tablet (25 mg total) by mouth daily. X 7 days, then increase to 2 tabs (50mg ) daily 60 tablet 3  . Blood Glucose Monitoring Suppl (FREESTYLE LITE) DEVI Use as directed four times daily E11.9 1 each 0  . butalbital-acetaminophen-caffeine (FIORICET) 50-325-40 MG tablet TAKE 1 TABLET BY MOUTH EVERY 6 HOURS AS  NEEDED FOR HEADACHE (MAX OF 1 OR 2 TABLETS PER DAY) 30 tablet 3  . Cholecalciferol (VITAMIN D) 50 MCG (2000 UT) CAPS Take 4,000 Units by mouth daily.    . cyclobenzaprine (FLEXERIL) 5 MG tablet Take 1 tablet (5 mg total) by mouth 3 (three) times daily as needed for muscle spasms. 90 tablet 1  . Diclofenac Sodium (PENNSAID) 2 % SOLN Place 2 g onto the skin 2 (two) times daily. 112 g 3  . esomeprazole (NEXIUM) 40 MG capsule TAKE 1 CAPSULE BY MOUTH 2 TIMES DAILY BEFORE A MEAL. 180 capsule 3  . ferrous sulfate 324 MG TBEC Take 324 mg by mouth daily.    . fexofenadine (ALLEGRA) 180 MG tablet Take 1 tablet (180 mg total) by mouth daily. 30 tablet 2  . Fremanezumab-vfrm (AJOVY) 225 MG/1.5ML SOAJ Inject 225 mg into the skin every 30 (thirty) days. 1 pen 11  . gabapentin (NEURONTIN) 300 MG capsule TAKE 1 CAPSULE BY MOUTH 2 TIMES DAILY. (Patient taking differently: Take 300 mg by mouth in the morning and at bedtime. Marland Kitchen) 60 capsule 5  . glucose blood (FREESTYLE LITE) test strip Use as instructed four times daily E11.9 400 each 12  . HYDROcodone-homatropine (HYCODAN) 5-1.5 MG/5ML syrup Take 5 mLs by mouth at bedtime as needed for cough. 75 mL 0  . Insulin Glargine (BASAGLAR KWIKPEN) 100 UNIT/ML Inject 0.34 mLs (34 Units total) into the skin daily. 30 mL 11  . insulin lispro (HUMALOG KWIKPEN) 100 UNIT/ML KwikPen Inject 0.05-0.08 mLs (5-8 Units total) into the skin 3 (three) times daily. (Patient taking differently: Inject 4-8 Units into the skin 3 (three) times daily. Sliding scale : if blood sugar 130 - takes 4 units . If over 150 takes 8 units.) 15 mL 11  . Lancets (FREESTYLE) lancets Use as instructed four times daily E11.9 400 each 12  . Lasmiditan Succinate (REYVOW) 100 MG TABS Take 100 mg by mouth as needed. Take 1 tablet for headache. No more than 1 tablet in  24 hours 8 tablet 3  . linaclotide (LINZESS) 145 MCG CAPS capsule Take 1 capsule (145 mcg total) by mouth daily before breakfast. (Patient taking  differently: Take 145 mcg by mouth daily as needed (for irritable bowel movement; pt states taking weekly). ) 30 capsule 2  . meclizine (ANTIVERT) 12.5 MG tablet Take 1 tablet (12.5 mg total) by mouth 3 (three) times daily as needed for dizziness. 40 tablet 1  . meloxicam (MOBIC) 15 MG tablet Take 1 tablet (15 mg total) by mouth daily as needed for pain. 30 tablet 1  . metoCLOPramide (REGLAN) 5 MG tablet Take 1 tablet (5 mg total) by mouth every 8 (eight) hours as needed for nausea or vomiting. 40 tablet 1  . Multiple Vitamin (MULTIVITAMIN WITH MINERALS) TABS tablet Take 1 tablet by mouth daily.    . naproxen (NAPROSYN) 500 MG tablet Take 1 tablet (500 mg total) by mouth 2 (two) times daily. 30 tablet 0  . nystatin-triamcinolone ointment (MYCOLOG) Apply 1 application topically 2 (two) times daily. 30 g 2  . ondansetron (ZOFRAN ODT) 8 MG disintegrating tablet Take 1 tablet (8 mg total) by mouth every 8 (eight) hours as needed for nausea or vomiting. 30 tablet 0  . promethazine (PHENERGAN) 25 MG tablet TAKE 1 TABLET BY MOUTH EVERY 4-6 HOURS AS NEEDED 12 tablet 0  . promethazine-dextromethorphan (PROMETHAZINE-DM) 6.25-15 MG/5ML syrup Take 5 mLs by mouth 4 (four) times daily as needed for cough. 118 mL 0  . rosuvastatin (CRESTOR) 40 MG tablet TAKE 1 TABLET (40 MG TOTAL) BY MOUTH DAILY. 90 tablet 3  . Semaglutide,0.25 or 0.5MG /DOS, (OZEMPIC, 0.25 OR 0.5 MG/DOSE,) 2 MG/1.5ML SOPN Inject 0.5 mg into the skin once a week. 2 pen 5  . traMADol (ULTRAM) 50 MG tablet TAKE 1 TABLET BY MOUTH EVERY 8 HOURS AS NEEDED (Patient taking differently: Take 50 mg by mouth 3 (three) times daily as needed for severe pain. ) 60 tablet 1  . triamcinolone (NASACORT) 55 MCG/ACT AERO nasal inhaler Place 2 sprays into the nose daily. 1 Inhaler 12  . UNIFINE PENTIPS 32G X 4 MM MISC USE 4 TIMES A DAY 200 each PRN   No current facility-administered medications on file prior to visit.    ALLERGIES: Allergies  Allergen Reactions    . Lipitor [Atorvastatin]     REACTION: myalgias  . Nitroglycerin     FAMILY HISTORY: Family History  Problem Relation Age of Onset  . Hypertension Father   . Diabetes Father   . Asthma Father   . Cervical cancer Maternal Aunt   . Heart disease Maternal Aunt   . Anesthesia problems Neg Hx   . Colon cancer Neg Hx   . Breast cancer Neg Hx     SOCIAL HISTORY: Social History   Socioeconomic History  . Marital status: Married    Spouse name: [name]  . Number of children: 3  . Years of education: Degree  . Highest education level: Not on file  Occupational History  . Occupation: Research scientist (life sciences): [employer]  . Occupation: Chartered certified accountant    Employer: [employer]  Tobacco Use  . Smoking status: Never Smoker  . Smokeless tobacco: Never Used  Vaping Use  . Vaping Use: Never used  Substance and Sexual Activity  . Alcohol use: No    Alcohol/week: 0.0 standard drinks  . Drug use: No  . Sexual activity: Not on file  Other Topics Concern  . Not on file  Social History Narrative   She has 3 daughters.   Patient has a 4 year degree.    Patient working at [employer].    Patient is married to Momence.   Social Determinants of Health   Financial Resource Strain:   . Difficulty of Paying Living Expenses: Not on file  Food Insecurity:   . Worried About Charity fundraiser in the Last Year: Not on file  . Ran Out of Food in the Last Year: Not on file  Transportation Needs:   . Lack of Transportation (Medical): Not on file  . Lack of Transportation (Non-Medical): Not on file  Physical Activity:   . Days of Exercise per Week: Not on file  . Minutes of Exercise per Session: Not on file  Stress:   . Feeling of Stress : Not on file  Social Connections:   . Frequency of Communication with Friends and Family: Not on file  . Frequency of Social Gatherings with Friends and Family: Not on file  . Attends Religious Services: Not on file  . Active Member of Clubs or  Organizations: Not on file  . Attends Archivist Meetings: Not on file  . Marital Status: Not on file  Intimate Partner Violence:   . Fear of Current or Ex-Partner: Not on file  . Emotionally Abused: Not on file  . Physically Abused: Not on file  . Sexually Abused: Not on file    PHYSICAL EXAM: *** General: No acute distress.  Patient appears ***-groomed.   Head:  Normocephalic/atraumatic Eyes:  Fundi examined but not visualized Neck: supple, no paraspinal tenderness, full range of motion Heart:  Regular rate and rhythm Lungs:  Clear to auscultation bilaterally Back: No paraspinal tenderness Neurological Exam: alert and oriented to person, place, and time. Attention span and concentration intact, recent and remote memory intact, fund of knowledge intact.  Speech fluent and not dysarthric, language intact.  CN II-XII intact. Bulk and tone normal, muscle strength 5/5 throughout.  Sensation to light touch, temperature and vibration intact.  Deep tendon reflexes 2+ throughout, toes downgoing.  Finger to nose and heel to shin testing intact.  Gait normal, Romberg negative.  IMPRESSION: ***  PLAN: ***  Metta Clines, DO  CC: ***

## 2020-02-29 ENCOUNTER — Ambulatory Visit: Payer: 59 | Admitting: Neurology

## 2020-03-03 ENCOUNTER — Ambulatory Visit: Payer: 59 | Admitting: Physical Therapy

## 2020-03-03 ENCOUNTER — Ambulatory Visit: Payer: 59 | Admitting: Neurology

## 2020-03-03 ENCOUNTER — Other Ambulatory Visit: Payer: Self-pay

## 2020-03-03 ENCOUNTER — Encounter: Payer: Self-pay | Admitting: Physical Therapy

## 2020-03-03 DIAGNOSIS — M542 Cervicalgia: Secondary | ICD-10-CM | POA: Diagnosis not present

## 2020-03-03 DIAGNOSIS — H8112 Benign paroxysmal vertigo, left ear: Secondary | ICD-10-CM

## 2020-03-03 DIAGNOSIS — R42 Dizziness and giddiness: Secondary | ICD-10-CM

## 2020-03-03 DIAGNOSIS — R2681 Unsteadiness on feet: Secondary | ICD-10-CM | POA: Diagnosis not present

## 2020-03-03 DIAGNOSIS — H8111 Benign paroxysmal vertigo, right ear: Secondary | ICD-10-CM

## 2020-03-03 DIAGNOSIS — M6281 Muscle weakness (generalized): Secondary | ICD-10-CM | POA: Diagnosis not present

## 2020-03-03 DIAGNOSIS — R262 Difficulty in walking, not elsewhere classified: Secondary | ICD-10-CM | POA: Diagnosis not present

## 2020-03-03 NOTE — Therapy (Signed)
[facility] [address] [place], [place], [address] Phone: [phone]   Fax:  [phone]  Physical Therapy Treatment  Patient Details  Name: Rachel [name] MRN: [id number] Date of Birth: Jul 25, 1965 Referring Provider (PT): Fenton Foy, NP   Encounter Date: 03/03/2020   PT End of Session - 03/03/20 1100    Visit Number 4    Number of Visits 17    Date for PT Re-Evaluation 04/21/20    Authorization Type [facility]; VL: [place]    PT Start Time 1020    PT Stop Time 6962   left early due to nausea/vomiting   PT Time Calculation (min) 31 min    Activity Tolerance Other (comment)   nausea and vomiting   Behavior During Therapy [facility] for tasks assessed/performed           Past Medical History:  Diagnosis Date   ALLERGIC RHINITIS 10/04/2007   Anemia 01/21/2011   Anxiety 11/15/2018   ASTHMA 08/02/2007   INHALERS ONLY IN [place]   Asthma 08/02/2007   Qualifier: Diagnosis of  By: Wynona Luna    Blood transfusion without reported diagnosis    with first child    Cervical radiculopathy 01/21/2011   Cervicogenic headache 04/27/2017   COMMON MIGRAINE 05/15/2009   Depression 01/09/2010   Qualifier: Diagnosis of  By: Jenny Reichmann MD, [place], TYPE II 08/02/2007   Dysphagia, pharyngoesophageal phase 06/12/2014   GERD 08/02/2007   HYPERLIPIDEMIA 08/02/2007   Hyperlipidemia 08/02/2007   Qualifier: Diagnosis of  By: Wynona Luna    INSOMNIA-SLEEP DISORDER-UNSPEC 01/04/2008   INTERMITTENT VERTIGO 05/15/2009   LIBIDO, DECREASED 01/09/2010   Migraine without aura 05/15/2009   Qualifier: Diagnosis of  By: Jenny Reichmann MD, Hunt Oris    Nonallopathic lesion of rib cage 12/21/2017   Nonallopathic lesion of sacral region 07/29/2015   Nonallopathic lesion of thoracic region 07/29/2015   Osteoporosis 04/25/2019   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   Rheumatoid factor positive 02/10/2016   Right  knee pain 01/31/2019   Injected January 31, 2019   Type 2 diabetes mellitus with hyperglycemia, with long-term current use of insulin ([place]) 08/02/2007   Qualifier: Diagnosis of  By: Wynona Luna     Past Surgical History:  Procedure Laterality Date   CESAREAN SECTION     x 3   CYST EXCISION     left knee   PAROTIDECTOMY  10/21/2011   Procedure: PAROTIDECTOMY;  Surgeon: Melida Quitter, MD;  Location: [facility];  Service: ENT;  Laterality: Left;    There were no vitals filed for this visit.   Subjective Assessment - 03/03/20 1022    Subjective Still having dizziness especially when bending down to do laundry.  Dizziness is less intense.   Gets the dizziness during the activity, not afterwards.  Dizziness settles down after she stops the activity.  Also feels dizzy when she lies on R side.    Patient is accompained by: Family member    Pertinent History cervicogenic HA, migraine, depression, type 2 DM, dysphagia, GERD, HLD, insomnia, intermittent vertigo, nonallopathic lesions of rib/sacral region/thoracic region, osteoporosis, patellofemoral syndrome of bilat knees, positive rheumatoid factor    Limitations Standing;Walking;House hold activities    Patient Stated Goals To be stronger and be able to do things independently    Currently in Pain? Yes    Pain Onset Today  Vestibular Assessment - 03/03/20 1025      Positional Testing   Dix-Hallpike Dix-Hallpike Right;Dix-Hallpike Left      Dix-Hallpike Right   Dix-Hallpike Right Duration 5 seconds    Dix-Hallpike Right Symptoms Upbeat, right rotatory nystagmus      Dix-Hallpike Left   Dix-Hallpike Left Duration 10 seconds    Dix-Hallpike Left Symptoms Upbeat, left rotatory nystagmus                     Vestibular Treatment/Exercise - 03/03/20 1035      Vestibular Treatment/Exercise   Vestibular Treatment Provided Canalith Repositioning    Canalith Repositioning Epley Manuever Right;Epley  Manuever Left       EPLEY MANUEVER RIGHT   Number of Reps  1    Overall Response Symptoms Worsened    Response Details  pt reporting significant spinning when head turned to L       EPLEY MANUEVER LEFT   Number of Reps  2    Overall Response  Improved Symptoms     RESPONSE DETAILS LEFT after second maneuver pt experienced increased nausea and almost vomited.  Pt had nausea medication she could take.                 PT Education - 03/03/20 1059    Education Details due to pt traveling on Wed, cancelled Friday and Monday's sessions and added a session tomorrow.  Conversion of anterior canal > posterior canal and bilat BPPV    Person(s) Educated Patient    Methods Explanation    Comprehension Verbalized understanding            PT Short Term Goals - 02/21/20 1258      PT SHORT TERM GOAL #1   Title Pt will participate in further vestibular, balance, gait assessments - MSQ/DVA, gait velocity, FGA    Time 4    Period Weeks    Status New    Target Date 03/22/20      PT SHORT TERM GOAL #2   Title Pt will be able to tolerate treatment for R posterior canal cupulo with resolution of dizziness with bed mobility.    Time 4    Period Weeks    Status New    Target Date 03/22/20      PT SHORT TERM GOAL #3   Title Pt will initiate HEP focusing on vestibular, neck, balance and strengthening    Time 4    Period Weeks    Status New    Target Date 03/22/20      PT SHORT TERM GOAL #4   Title Pt will report decreased dizziness with bending forwards to allow her to tie her own shoes and don lower body clothing.    Time 4    Period Weeks    Status New    Target Date 03/22/20             PT Long Term Goals - 02/21/20 1302      PT LONG TERM GOAL #1   Title Pt will report increase in FOTO function to >/= 78% and decrease in [address] by 18 points    Baseline FOTO: 48%, DHI: 68    Time 8    Period Weeks    Status New    Target Date 04/21/20      PT LONG TERM GOAL #2   Title  Pt will report 0/5 dizziness on MSQ to allow pt to return to full independence with  driving, ADL, and housework    Time 8    Period Weeks    Status New    Target Date 04/21/20      PT LONG TERM GOAL #3   Title Pt will demonstrate 4/5 UE and LE strength and demonstrate 10 deg in cervical spine ROM and decrease in neck pain in order to return to driving    Baseline 3+/5, see flow sheet for ROM    Time 8    Period Weeks    Status New    Target Date 04/21/20      PT LONG TERM GOAL #4   Title Pt will demonstrate gait velocity >3.5 ft/sec and increase FGA by 4 points to indicate decreased falls risk    Baseline TBD for both    Time 8    Period Weeks    Status New    Target Date 04/21/20      PT LONG TERM GOAL #5   Title Pt will be independent with final HEP    Time 8    Period Weeks    Status New    Target Date 04/21/20                 Plan - 03/03/20 1101    Clinical Impression Statement Pt demonstrated resolution of R anterior canal cupulolithiasis but in R hallpike pt demonstrated brief upbeating, R rotary nystagmus.  When treated with R CRM pt experienced significant spinning with head turned to L.  Reassesed L posterior canal with pt now demonstrating L upbeating nystagmus of short duration indicating new L posterior canal BPPV.  Pt treated x2 with CRM but had to stop early due to nausea and vomiting.  Will see pt for one more session tomorrow to re-assess and treat if needed before pt travels.    Personal Factors and Comorbidities Comorbidity 3+;Profession;Social Background;Transportation    Comorbidities cervicogenic HA, migraine, depression, type 2 DM, dysphagia, GERD, HLD, insomnia, intermittent vertigo, nonallopathic lesions of rib/sacral region/thoracic region, osteoporosis, patellofemoral syndrome of bilat knees, positive rheumatoid factor    Examination-Activity Limitations Bathing;Bed Mobility;Bend;Dressing;Hygiene/Grooming;Locomotion Level;Sleep;Stairs;Transfers     Examination-Participation Restrictions Cleaning;Community Activity;Driving;Laundry;Meal Prep;Occupation    Stability/Clinical Decision Making Evolving/Moderate complexity    Rehab Potential Good    PT Frequency 2x / week    PT Duration 8 weeks    PT Treatment/Interventions ADLs/Self Care Home Management;Aquatic Therapy;Canalith Repostioning;Cryotherapy;Moist Heat;Gait training;Stair training;Functional mobility training;Therapeutic activities;Therapeutic exercise;Balance training;Neuromuscular re-education;Patient/family education;Manual techniques;Passive range of motion;Dry needling;Vestibular    PT Next Visit Plan [place] - she is flying on Wed to see her daughter.  She did have anterior canal cupulo on R - treated.  Now has L posterior canal BPPV.  Please reassess and treat as indicated.  If no BPPV assess MSQ/DVA, initiate HEP.  Need a balance or gait assessment - FGA, gait velocity.  When dizziness has improved also begin to work on endurance, neck ROM/pain, UE and LE strengthening, functional ADL.    Consulted and Agree with Plan of Care Patient           Patient will benefit from skilled therapeutic intervention in order to improve the following deficits and impairments:  Decreased activity tolerance, Decreased balance, Decreased endurance, Decreased range of motion, Decreased strength, Difficulty walking, Dizziness, Pain  Visit Diagnosis: BPPV (benign paroxysmal positional vertigo), left  BPPV (benign paroxysmal positional vertigo), right  Dizziness and giddiness  Unsteadiness on feet     Problem List Patient Active Problem List   Diagnosis Date Noted  History of COVID-19 02/20/2020   Fatigue 02/20/2020   Benign paroxysmal positional vertigo due to bilateral vestibular disorder 02/20/2020   Physical deconditioning 02/20/2020   Tachycardia 02/20/2020   Orthostatic hypotension 02/20/2020   Myofascial pain syndrome, cervical 02/12/2020   COVID-19 01/16/2020    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   Osteoporosis 04/25/2019   Right knee pain 01/31/2019   Anxiety 11/15/2018   Nonallopathic lesion of rib cage 12/21/2017   Cervicogenic headache 04/27/2017   Nonallopathic lesion of cervical region 04/27/2017   Rheumatoid factor positive 02/10/2016   Nonallopathic lesion of lumbosacral region 07/29/2015   Nonallopathic lesion of sacral region 07/29/2015   Nonallopathic lesion of thoracic region 07/29/2015   Dysphagia, pharyngoesophageal phase 06/12/2014   Microcytic anemia 01/21/2011   Cervical radiculopathy 01/21/2011   Depression 01/09/2010   Migraine without aura 05/15/2009   Vertigo 05/15/2009   INSOMNIA-SLEEP DISORDER-UNSPEC 01/04/2008   Type 2 diabetes mellitus with hyperglycemia, with long-term current use of insulin ([place]) 08/02/2007   Hyperlipidemia 08/02/2007   Asthma 08/02/2007   GERD 08/02/2007    Rico Junker, PT, DPT 03/03/20    12:30 PM    [facility] [address] [place], [place], [address] Phone: [phone]   Fax:  [phone]  Name: Rachel [name] MRN: [id number] Date of Birth: [birth year]

## 2020-03-04 ENCOUNTER — Encounter: Payer: Self-pay | Admitting: Physical Therapy

## 2020-03-04 ENCOUNTER — Ambulatory Visit: Payer: 59 | Admitting: Physical Therapy

## 2020-03-04 VITALS — BP 117/87 | HR 90

## 2020-03-04 DIAGNOSIS — M6281 Muscle weakness (generalized): Secondary | ICD-10-CM | POA: Diagnosis not present

## 2020-03-04 DIAGNOSIS — R42 Dizziness and giddiness: Secondary | ICD-10-CM | POA: Diagnosis not present

## 2020-03-04 DIAGNOSIS — H8112 Benign paroxysmal vertigo, left ear: Secondary | ICD-10-CM

## 2020-03-04 DIAGNOSIS — R2681 Unsteadiness on feet: Secondary | ICD-10-CM | POA: Diagnosis not present

## 2020-03-04 DIAGNOSIS — R262 Difficulty in walking, not elsewhere classified: Secondary | ICD-10-CM | POA: Diagnosis not present

## 2020-03-04 DIAGNOSIS — H8111 Benign paroxysmal vertigo, right ear: Secondary | ICD-10-CM | POA: Diagnosis not present

## 2020-03-04 DIAGNOSIS — M542 Cervicalgia: Secondary | ICD-10-CM | POA: Diagnosis not present

## 2020-03-04 NOTE — Therapy (Signed)
[facility] [address] [place], [place], [address] Phone: [phone]   Fax:  [phone]  Physical Therapy Treatment  Patient Details  Name: Rachel [name] MRN: [id number] Date of Birth: October 01, 1965 Referring Provider (PT): Fenton Foy, NP   Encounter Date: 03/04/2020   PT End of Session - 03/04/20 2108    Visit Number 5    Number of Visits 17    Date for PT Re-Evaluation 04/21/20    Authorization Type [facility]; VL: [place]    PT Start Time 0850    PT Stop Time 0930    PT Time Calculation (min) 40 min    Activity Tolerance Other (comment)   nausea and vomiting   Behavior During Therapy [facility] for tasks assessed/performed           Past Medical History:  Diagnosis Date  . ALLERGIC RHINITIS 10/04/2007  . Anemia 01/21/2011  . Anxiety 11/15/2018  . ASTHMA 08/02/2007   INHALERS ONLY IN [place]  . Asthma 08/02/2007   Qualifier: Diagnosis of  By: Wynona Luna   . Blood transfusion without reported diagnosis    with first child   . Cervical radiculopathy 01/21/2011  . Cervicogenic headache 04/27/2017  . COMMON MIGRAINE 05/15/2009  . Depression 01/09/2010   Qualifier: Diagnosis of  By: Jenny Reichmann MD, Hunt Oris   . DIABETES MELLITUS, TYPE II 08/02/2007  . Dysphagia, pharyngoesophageal phase 06/12/2014  . GERD 08/02/2007  . HYPERLIPIDEMIA 08/02/2007  . Hyperlipidemia 08/02/2007   Qualifier: Diagnosis of  By: Wynona Luna   . INSOMNIA-SLEEP DISORDER-UNSPEC 01/04/2008  . INTERMITTENT VERTIGO 05/15/2009  . LIBIDO, DECREASED 01/09/2010  . Migraine without aura 05/15/2009   Qualifier: Diagnosis of  By: Jenny Reichmann MD, Hunt Oris   . Nonallopathic lesion of rib cage 12/21/2017  . Nonallopathic lesion of sacral region 07/29/2015  . Nonallopathic lesion of thoracic region 07/29/2015  . Osteoporosis 04/25/2019  . 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 Rheumatoid factor positive 02/10/2016  . Right knee pain 01/31/2019   Injected  January 31, 2019  . Type 2 diabetes mellitus with hyperglycemia, with long-term current use of insulin ([place]) 08/02/2007   Qualifier: Diagnosis of  By: Wynona Luna     Past Surgical History:  Procedure Laterality Date  . CESAREAN SECTION     x 3  . CYST EXCISION     left knee  . PAROTIDECTOMY  10/21/2011   Procedure: PAROTIDECTOMY;  Surgeon: Melida Quitter, MD;  Location: [facility] OR;  Service: ENT;  Laterality: Left;    Vitals:   03/04/20 0911  BP: 117/87  Pulse: 90     Subjective Assessment - 03/04/20 2054    Subjective Pt states she is feeling little better today - says she was here for treatment yesterday and became sick when she rolled onto her left side; went home and lied down; pt leaves Wed. for [place] to visit her daughter - will be gone for a week and a half    Patient is accompained by: Family member    Pertinent History cervicogenic HA, migraine, depression, type 2 DM, dysphagia, GERD, HLD, insomnia, intermittent vertigo, nonallopathic lesions of rib/sacral region/thoracic region, osteoporosis, patellofemoral syndrome of bilat knees, positive rheumatoid factor    Limitations Standing;Walking;House hold activities    Patient Stated Goals To be stronger and be able to do things independently    Currently in Pain? Yes    Pain Score 4  Pain Location Back   upper trap area - worse on Rt side   Pain Orientation Upper;Right    Pain Descriptors / Indicators Aching;Tightness;Discomfort    Pain Type Chronic pain    Pain Onset 1 to 4 weeks ago    Pain Frequency Intermittent                   Vestibular Assessment - 03/04/20 0912      Positional Testing   Dix-Hallpike Dix-Hallpike Right;Dix-Hallpike Left    Sidelying Test Sidelying Right;Sidelying Left      Dix-Hallpike Right   Dix-Hallpike Right Duration none   pt reported mod dizziness with return to sitting   Dix-Hallpike Right Symptoms No nystagmus      Dix-Hallpike Left   Dix-Hallpike Left  Duration none   pt reported mod dizziness with return to sitting   Dix-Hallpike Left Symptoms No nystagmus      Orthostatics   BP supine (x 5 minutes) 120/79    HR supine (x 5 minutes) 84    BP sitting 114/80    HR sitting 96    BP standing (after 1 minute) 127/75    HR standing (after 1 minute) 93    BP standing (after 3 minutes) 119/86    HR standing (after 3 minutes) 94    Orthostatics Comment no c/o light-headedness in standing             SVA line 9;  DVA line 5 - pt reported no incr. dizziness upon completion of test     X1 viewing exercise to be issued when pt returns from trip from [place]             PT Education - 03/04/20 2106    Education Details HEP not initiated due to pt travelling to [place] on [place] 0-08-67 - will initiate HEP when pt returns (pt does not want to provoke dizziness while on this trip)    Person(s) Educated Patient    Methods Explanation    Comprehension Verbalized understanding            PT Short Term Goals - 03/04/20 2120      PT SHORT TERM GOAL #1   Title Pt will participate in further vestibular, balance, gait assessments - MSQ/DVA, gait velocity, FGA    Time 4    Period Weeks    Status New    Target Date 03/22/20      PT SHORT TERM GOAL #2   Title Pt will be able to tolerate treatment for R posterior canal cupulo with resolution of dizziness with bed mobility.    Time 4    Period Weeks    Status New    Target Date 03/22/20      PT SHORT TERM GOAL #3   Title Pt will initiate HEP focusing on vestibular, neck, balance and strengthening    Time 4    Period Weeks    Status New    Target Date 03/22/20      PT SHORT TERM GOAL #4   Title Pt will report decreased dizziness with bending forwards to allow her to tie her own shoes and don lower body clothing.    Time 4    Period Weeks    Status New    Target Date 03/22/20             PT Long Term Goals - 03/04/20 2120      PT LONG TERM  GOAL #  1   Title Pt will report increase in FOTO function to >/= 78% and decrease in [place] by 18 points    Baseline FOTO: 48%, DHI: 68    Time 8    Period Weeks    Status New      PT LONG TERM GOAL #2   Title Pt will report 0/5 dizziness on MSQ to allow pt to return to full independence with driving, ADL, and housework    Time 8    Period Weeks    Status New      PT LONG TERM GOAL #3   Title Pt will demonstrate 4/5 UE and LE strength and demonstrate 10 deg in cervical spine ROM and decrease in neck pain in order to return to driving    Baseline 3+/5, see flow sheet for ROM    Time 8    Period Weeks    Status New      PT LONG TERM GOAL #4   Title Pt will demonstrate gait velocity >3.5 ft/sec and increase FGA by 4 points to indicate decreased falls risk    Baseline TBD for both    Time 8    Period Weeks    Status New      PT LONG TERM GOAL #5   Title Pt will be independent with final HEP    Time 8    Period Weeks    Status New                 Plan - 03/04/20 2109    Clinical Impression Statement Pt had no signs or symptoms of Lt posterior canal BPPV as she did during treatment session on 03-03-20;  pt did report moderate dizziness with return to upright from both Rt and Lt Dix-Hallpike test positions and also from Rt and Lt sidelying positions.  No nystagmus noted during treatment session today - orthostatics assessed but BP readings do not indicate orthostatic hypotension.  DVA abnormal with a 4 line difference but pt reported no vertigo upon completion of test.    Personal Factors and Comorbidities Comorbidity 3+;Profession;Social Background;Transportation    Comorbidities cervicogenic HA, migraine, depression, type 2 DM, dysphagia, GERD, HLD, insomnia, intermittent vertigo, nonallopathic lesions of rib/sacral region/thoracic region, osteoporosis, patellofemoral syndrome of bilat knees, positive rheumatoid factor    Examination-Activity Limitations Bathing;Bed  Mobility;Bend;Dressing;Hygiene/Grooming;Locomotion Level;Sleep;Stairs;Transfers    Examination-Participation Restrictions Cleaning;Community Activity;Driving;Laundry;Meal Prep;Occupation    Stability/Clinical Decision Making Evolving/Moderate complexity    Rehab Potential Good    PT Frequency 2x / week    PT Duration 8 weeks    PT Treatment/Interventions ADLs/Self Care Home Management;Aquatic Therapy;Canalith Repostioning;Cryotherapy;Moist Heat;Gait training;Stair training;Functional mobility training;Therapeutic activities;Therapeutic exercise;Balance training;Neuromuscular re-education;Patient/family education;Manual techniques;Passive range of motion;Dry needling;Vestibular    PT Next Visit Plan Reassess BPPV (pt had Rt anterior cupulo which converted to Lt posterior on 03-03-20, but no nystagmus was noted when I (Suz.)  saw her on 03-04-20 Please reassess and treat as indicated.(Pt having dizziness with return to upright sitting)  If no BPPV assess MSQ, initiate HEP for x1 viewing   Need a balance or gait assessment - FGA, gait velocity.  When dizziness has improved also begin to work on endurance, neck ROM/pain, UE and LE strengthening, functional ADL.    Consulted and Agree with Plan of Care Patient           Patient will benefit from skilled therapeutic intervention in order to improve the following deficits and impairments:  Decreased activity tolerance, Decreased balance, Decreased endurance,  Decreased range of motion, Decreased strength, Difficulty walking, Dizziness, Pain  Visit Diagnosis: BPPV (benign paroxysmal positional vertigo), left  Dizziness and giddiness     Problem List Patient Active Problem List   Diagnosis Date Noted  . History of COVID-19 02/20/2020  . Fatigue 02/20/2020  . Benign paroxysmal positional vertigo due to bilateral vestibular disorder 02/20/2020  . Physical deconditioning 02/20/2020  . Tachycardia 02/20/2020  . Orthostatic hypotension 02/20/2020  .  Myofascial pain syndrome, cervical 02/12/2020  . COVID-19 01/16/2020  . 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 Osteoporosis 04/25/2019  . Right knee pain 01/31/2019  . Anxiety 11/15/2018  . Nonallopathic lesion of rib cage 12/21/2017  . Cervicogenic headache 04/27/2017  . Nonallopathic lesion of cervical region 04/27/2017  . Rheumatoid factor positive 02/10/2016  . Nonallopathic lesion of lumbosacral region 07/29/2015  . Nonallopathic lesion of sacral region 07/29/2015  . Nonallopathic lesion of thoracic region 07/29/2015  . Dysphagia, pharyngoesophageal phase 06/12/2014  . Microcytic anemia 01/21/2011  . Cervical radiculopathy 01/21/2011  . Depression 01/09/2010  . Migraine without aura 05/15/2009  . Vertigo 05/15/2009  . INSOMNIA-SLEEP DISORDER-UNSPEC 01/04/2008  . Type 2 diabetes mellitus with hyperglycemia, with long-term current use of insulin ([place]) 08/02/2007  . Hyperlipidemia 08/02/2007  . Asthma 08/02/2007  . GERD 08/02/2007    [place], PT 03/04/2020, 9:23 PM  [facility] [address] [place], [place], [address] Phone: [phone]   Fax:  [phone]  Name: Rachel [name] MRN: [id number] Date of Birth: [birth year]

## 2020-03-07 ENCOUNTER — Ambulatory Visit: Payer: 59

## 2020-03-10 ENCOUNTER — Ambulatory Visit: Payer: 59 | Admitting: Physical Therapy

## 2020-03-13 ENCOUNTER — Ambulatory Visit: Payer: 59

## 2020-03-17 ENCOUNTER — Other Ambulatory Visit: Payer: Self-pay

## 2020-03-17 ENCOUNTER — Ambulatory Visit: Payer: 59 | Attending: Internal Medicine

## 2020-03-17 DIAGNOSIS — R42 Dizziness and giddiness: Secondary | ICD-10-CM | POA: Insufficient documentation

## 2020-03-17 DIAGNOSIS — M6281 Muscle weakness (generalized): Secondary | ICD-10-CM | POA: Insufficient documentation

## 2020-03-17 DIAGNOSIS — R2681 Unsteadiness on feet: Secondary | ICD-10-CM | POA: Diagnosis not present

## 2020-03-17 DIAGNOSIS — H8112 Benign paroxysmal vertigo, left ear: Secondary | ICD-10-CM | POA: Insufficient documentation

## 2020-03-17 DIAGNOSIS — R262 Difficulty in walking, not elsewhere classified: Secondary | ICD-10-CM | POA: Insufficient documentation

## 2020-03-17 DIAGNOSIS — M542 Cervicalgia: Secondary | ICD-10-CM | POA: Insufficient documentation

## 2020-03-17 NOTE — Therapy (Signed)
[facility] [address] [place], [place], [address] Phone: [phone]   Fax:  [phone]  Physical Therapy Treatment  Patient Details  Name: Rachel [name] MRN: [id number] Date of Birth: Jun 29, 1965 Referring Provider (PT): Fenton Foy, NP   Encounter Date: 03/17/2020   PT End of Session - 03/17/20 0739    Visit Number 6    Number of Visits 17    Date for PT Re-Evaluation 04/21/20    Authorization Type [facility]; VL: [place]    PT Start Time 0705    PT Stop Time 0738   pt requesting to leave for work   PT Time Calculation (min) 33 min    Activity Tolerance Patient tolerated treatment well    Behavior During Therapy [facility] for tasks assessed/performed           Past Medical History:  Diagnosis Date  . ALLERGIC RHINITIS 10/04/2007  . Anemia 01/21/2011  . Anxiety 11/15/2018  . ASTHMA 08/02/2007   INHALERS ONLY IN [place]  . Asthma 08/02/2007   Qualifier: Diagnosis of  By: Wynona Luna   . Blood transfusion without reported diagnosis    with first child   . Cervical radiculopathy 01/21/2011  . Cervicogenic headache 04/27/2017  . COMMON MIGRAINE 05/15/2009  . Depression 01/09/2010   Qualifier: Diagnosis of  By: Jenny Reichmann MD, Hunt Oris   . DIABETES MELLITUS, TYPE II 08/02/2007  . Dysphagia, pharyngoesophageal phase 06/12/2014  . GERD 08/02/2007  . HYPERLIPIDEMIA 08/02/2007  . Hyperlipidemia 08/02/2007   Qualifier: Diagnosis of  By: Wynona Luna   . INSOMNIA-SLEEP DISORDER-UNSPEC 01/04/2008  . INTERMITTENT VERTIGO 05/15/2009  . LIBIDO, DECREASED 01/09/2010  . Migraine without aura 05/15/2009   Qualifier: Diagnosis of  By: Jenny Reichmann MD, Hunt Oris   . Nonallopathic lesion of rib cage 12/21/2017  . Nonallopathic lesion of sacral region 07/29/2015  . Nonallopathic lesion of thoracic region 07/29/2015  . Osteoporosis 04/25/2019  . 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 Rheumatoid factor positive 02/10/2016  . Right knee  pain 01/31/2019   Injected January 31, 2019  . Type 2 diabetes mellitus with hyperglycemia, with long-term current use of insulin ([place]) 08/02/2007   Qualifier: Diagnosis of  By: Wynona Luna     Past Surgical History:  Procedure Laterality Date  . CESAREAN SECTION     x 3  . CYST EXCISION     left knee  . PAROTIDECTOMY  10/21/2011   Procedure: PAROTIDECTOMY;  Surgeon: Melida Quitter, MD;  Location: [facility];  Service: ENT;  Laterality: Left;    There were no vitals filed for this visit.   Subjective Assessment - 03/17/20 0708    Subjective Patient reports one episode of dizziness that occured last night, lasted around 1 minute according to patient.    Patient is accompained by: Family member    Pertinent History cervicogenic HA, migraine, depression, type 2 DM, dysphagia, GERD, HLD, insomnia, intermittent vertigo, nonallopathic lesions of rib/sacral region/thoracic region, osteoporosis, patellofemoral syndrome of bilat knees, positive rheumatoid factor    Limitations Standing;Walking;House hold activities    Patient Stated Goals To be stronger and be able to do things independently    Currently in Pain? Yes    Pain Score 6     Pain Location Neck    Pain Orientation Left;Lower    Pain Descriptors / Indicators Stabbing    Pain Type Chronic pain    Pain Onset 1 to 4 weeks  ago                   Vestibular Assessment - 03/17/20 0001      Positional Testing   Dix-Hallpike Dix-Hallpike Right;Dix-Hallpike Left      Dix-Hallpike Right   Dix-Hallpike Right Duration none    Dix-Hallpike Right Symptoms No nystagmus      Dix-Hallpike Left   Dix-Hallpike Left Duration 5 seconds    Dix-Hallpike Left Symptoms Other (comment)   no nystagmus, but mild symptom (<5 seconds)                     Vestibular Treatment/Exercise - 03/17/20 0001      Vestibular Treatment/Exercise   Vestibular Treatment Provided Canalith Repositioning;Gaze    Canalith Repositioning Epley  Manuever Left    Gaze Exercises X1 Viewing Horizontal;X1 Viewing Vertical       EPLEY MANUEVER LEFT   Number of Reps  2    Overall Response  Improved Symptoms     RESPONSE DETAILS LEFT after completion of second maneuver patient report decreased smyptoms. no nausea with completion today.       X1 Viewing Horizontal   Foot Position seated    Reps 2    Comments x 45 seconds each. each. patient reports mild discomfort in neck due to increased tightness.       X1 Viewing Vertical   Foot Position seated    Reps 2    Comments x 45 seconds each. patient reports mild discomfort in neck due to increased tightness                 PT Education - 03/17/20 0738    Education Details HEP (Gaze Stabilization)    Person(s) Educated Patient    Methods Explanation;Demonstration;Handout    Comprehension Verbalized understanding;Returned demonstration            PT Short Term Goals - 03/04/20 2120      PT SHORT TERM GOAL #1   Title Pt will participate in further vestibular, balance, gait assessments - MSQ/DVA, gait velocity, FGA    Time 4    Period Weeks    Status New    Target Date 03/22/20      PT SHORT TERM GOAL #2   Title Pt will be able to tolerate treatment for R posterior canal cupulo with resolution of dizziness with bed mobility.    Time 4    Period Weeks    Status New    Target Date 03/22/20      PT SHORT TERM GOAL #3   Title Pt will initiate HEP focusing on vestibular, neck, balance and strengthening    Time 4    Period Weeks    Status New    Target Date 03/22/20      PT SHORT TERM GOAL #4   Title Pt will report decreased dizziness with bending forwards to allow her to tie her own shoes and don lower body clothing.    Time 4    Period Weeks    Status New    Target Date 03/22/20             PT Long Term Goals - 03/04/20 2120      PT LONG TERM GOAL #1   Title Pt will report increase in FOTO function to >/= 78% and decrease in [place] by 18 points     Baseline FOTO: 48%, DHI: 68    Time 8    Period  Weeks    Status New      PT LONG TERM GOAL #2   Title Pt will report 0/5 dizziness on MSQ to allow pt to return to full independence with driving, ADL, and housework    Time 8    Period Weeks    Status New      PT LONG TERM GOAL #3   Title Pt will demonstrate 4/5 UE and LE strength and demonstrate 10 deg in cervical spine ROM and decrease in neck pain in order to return to driving    Baseline 3+/5, see flow sheet for ROM    Time 8    Period Weeks    Status New      PT LONG TERM GOAL #4   Title Pt will demonstrate gait velocity >3.5 ft/sec and increase FGA by 4 points to indicate decreased falls risk    Baseline TBD for both    Time 8    Period Weeks    Status New      PT LONG TERM GOAL #5   Title Pt will be independent with final HEP    Time 8    Period Weeks    Status New                 Plan - 03/17/20 0741    Clinical Impression Statement With Lt Dix Hallpike test position, patient had no nystagmus but did report vertigo sesnsation that lasted <5 seconds. Completed treatment x 2 reps with improved symptoms after completion. Initiate VOR x 1 HEP with patient reports discomfort due to stiffness in neck. Patietn requesting dry needling for neck stiffness, PT educating that will work to arrange therapy appointments for this.    Personal Factors and Comorbidities Comorbidity 3+;Profession;Social Background;Transportation    Comorbidities cervicogenic HA, migraine, depression, type 2 DM, dysphagia, GERD, HLD, insomnia, intermittent vertigo, nonallopathic lesions of rib/sacral region/thoracic region, osteoporosis, patellofemoral syndrome of bilat knees, positive rheumatoid factor    Examination-Activity Limitations Bathing;Bed Mobility;Bend;Dressing;Hygiene/Grooming;Locomotion Level;Sleep;Stairs;Transfers    Examination-Participation Restrictions Cleaning;Community Activity;Driving;Laundry;Meal Prep;Occupation     Stability/Clinical Decision Making Evolving/Moderate complexity    Rehab Potential Good    PT Frequency 2x / week    PT Duration 8 weeks    PT Treatment/Interventions ADLs/Self Care Home Management;Aquatic Therapy;Canalith Repostioning;Cryotherapy;Moist Heat;Gait training;Stair training;Functional mobility training;Therapeutic activities;Therapeutic exercise;Balance training;Neuromuscular re-education;Patient/family education;Manual techniques;Passive range of motion;Dry needling;Vestibular    PT Next Visit Plan Reassess BPPV (pt had Rt anterior cupulo which converted to Lt posterior on 03-03-20), treated L posterior last session with no nystagmus. reassess and treat as indicated  How was HEP? If no BPPV assess MSQ. Need a balance or gait assessment - FGA, gait velocity.  When dizziness has improved also begin to work on endurance, neck ROM/pain, UE and LE strengthening, functional ADL.    Consulted and Agree with Plan of Care Patient           Patient will benefit from skilled therapeutic intervention in order to improve the following deficits and impairments:  Decreased activity tolerance, Decreased balance, Decreased endurance, Decreased range of motion, Decreased strength, Difficulty walking, Dizziness, Pain  Visit Diagnosis: BPPV (benign paroxysmal positional vertigo), left  Dizziness and giddiness  Unsteadiness on feet  Difficulty in walking, not elsewhere classified  Cervicalgia     Problem List Patient Active Problem List   Diagnosis Date Noted  . History of COVID-19 02/20/2020  . Fatigue 02/20/2020  . Benign paroxysmal positional vertigo due to bilateral vestibular disorder 02/20/2020  .  Physical deconditioning 02/20/2020  . Tachycardia 02/20/2020  . Orthostatic hypotension 02/20/2020  . Myofascial pain syndrome, cervical 02/12/2020  . COVID-19 01/16/2020  . 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 Osteoporosis 04/25/2019  . Right knee pain 01/31/2019  .  Anxiety 11/15/2018  . Nonallopathic lesion of rib cage 12/21/2017  . Cervicogenic headache 04/27/2017  . Nonallopathic lesion of cervical region 04/27/2017  . Rheumatoid factor positive 02/10/2016  . Nonallopathic lesion of lumbosacral region 07/29/2015  . Nonallopathic lesion of sacral region 07/29/2015  . Nonallopathic lesion of thoracic region 07/29/2015  . Dysphagia, pharyngoesophageal phase 06/12/2014  . Microcytic anemia 01/21/2011  . Cervical radiculopathy 01/21/2011  . Depression 01/09/2010  . Migraine without aura 05/15/2009  . Vertigo 05/15/2009  . INSOMNIA-SLEEP DISORDER-UNSPEC 01/04/2008  . Type 2 diabetes mellitus with hyperglycemia, with long-term current use of insulin ([place]) 08/02/2007  . Hyperlipidemia 08/02/2007  . Asthma 08/02/2007  . GERD 08/02/2007    Jones Bales 03/17/2020, 7:46 AM  [facility] [address] [place], [place], [address] Phone: [phone]   Fax:  [phone]  Name: Adina Puzzo MRN: [id number] Date of Birth: Sep 25, 1965

## 2020-03-17 NOTE — Patient Instructions (Signed)
Gaze Stabilization: Sitting    Keeping eyes on target on wall 4 feet away, tilt head down 15-30 and move head side to side for 45 seconds. Repeat while moving head up and down for 45 seconds. Do 2-3 sessions per day. Repeat using target on pattern background.  Copyright  VHI. All rights reserved.

## 2020-03-24 ENCOUNTER — Other Ambulatory Visit: Payer: Self-pay

## 2020-03-24 ENCOUNTER — Ambulatory Visit: Payer: 59

## 2020-03-24 DIAGNOSIS — M542 Cervicalgia: Secondary | ICD-10-CM

## 2020-03-24 DIAGNOSIS — R2681 Unsteadiness on feet: Secondary | ICD-10-CM | POA: Diagnosis not present

## 2020-03-24 DIAGNOSIS — M6281 Muscle weakness (generalized): Secondary | ICD-10-CM | POA: Diagnosis not present

## 2020-03-24 DIAGNOSIS — H8112 Benign paroxysmal vertigo, left ear: Secondary | ICD-10-CM | POA: Diagnosis not present

## 2020-03-24 DIAGNOSIS — R262 Difficulty in walking, not elsewhere classified: Secondary | ICD-10-CM

## 2020-03-24 DIAGNOSIS — R42 Dizziness and giddiness: Secondary | ICD-10-CM

## 2020-03-24 NOTE — Therapy (Signed)
[facility] [address] [place], [place], [address] Phone: [phone]   Fax:  [phone]  Physical Therapy Treatment  Patient Details  Name: Rachel [name] MRN: [id number] Date of Birth: 10-Sep-1965 Referring Provider (PT): Fenton Foy, NP   Encounter Date: 03/24/2020   PT End of Session - 03/24/20 0720    Visit Number 7    Number of Visits 17    Date for PT Re-Evaluation 04/21/20    Authorization Type [facility]; VL: [place]    PT Start Time 0715    PT Stop Time 0740   pt requesting to leave for work   PT Time Calculation (min) 25 min    Activity Tolerance Patient tolerated treatment well    Behavior During Therapy [facility] for tasks assessed/performed           Past Medical History:  Diagnosis Date  . ALLERGIC RHINITIS 10/04/2007  . Anemia 01/21/2011  . Anxiety 11/15/2018  . ASTHMA 08/02/2007   INHALERS ONLY IN [place]  . Asthma 08/02/2007   Qualifier: Diagnosis of  By: Wynona Luna   . Blood transfusion without reported diagnosis    with first child   . Cervical radiculopathy 01/21/2011  . Cervicogenic headache 04/27/2017  . COMMON MIGRAINE 05/15/2009  . Depression 01/09/2010   Qualifier: Diagnosis of  By: Jenny Reichmann MD, Hunt Oris   . DIABETES MELLITUS, TYPE II 08/02/2007  . Dysphagia, pharyngoesophageal phase 06/12/2014  . GERD 08/02/2007  . HYPERLIPIDEMIA 08/02/2007  . Hyperlipidemia 08/02/2007   Qualifier: Diagnosis of  By: Wynona Luna   . INSOMNIA-SLEEP DISORDER-UNSPEC 01/04/2008  . INTERMITTENT VERTIGO 05/15/2009  . LIBIDO, DECREASED 01/09/2010  . Migraine without aura 05/15/2009   Qualifier: Diagnosis of  By: Jenny Reichmann MD, Hunt Oris   . Nonallopathic lesion of rib cage 12/21/2017  . Nonallopathic lesion of sacral region 07/29/2015  . Nonallopathic lesion of thoracic region 07/29/2015  . Osteoporosis 04/25/2019  . 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 Rheumatoid factor positive 02/10/2016  . Right knee  pain 01/31/2019   Injected January 31, 2019  . Type 2 diabetes mellitus with hyperglycemia, with long-term current use of insulin ([place]) 08/02/2007   Qualifier: Diagnosis of  By: Wynona Luna     Past Surgical History:  Procedure Laterality Date  . CESAREAN SECTION     x 3  . CYST EXCISION     left knee  . PAROTIDECTOMY  10/21/2011   Procedure: PAROTIDECTOMY;  Surgeon: Melida Quitter, MD;  Location: [place];  Service: ENT;  Laterality: Left;    There were no vitals filed for this visit.   Subjective Assessment - 03/24/20 0718    Subjective Patient reports no episodes of dizziness has improved, no episodes recently. Reports neck pain is her concern at this time.    Patient is accompained by: Family member    Pertinent History cervicogenic HA, migraine, depression, type 2 DM, dysphagia, GERD, HLD, insomnia, intermittent vertigo, nonallopathic lesions of rib/sacral region/thoracic region, osteoporosis, patellofemoral syndrome of bilat knees, positive rheumatoid factor    Limitations Standing;Walking;House hold activities    Patient Stated Goals To be stronger and be able to do things independently    Currently in Pain? Yes    Pain Score 7     Pain Location Neck    Pain Orientation Left;Lower    Pain Descriptors / Indicators Aching;Stabbing    Pain Type Chronic pain    Pain Onset 1  to 4 weeks ago              [facility] PT Assessment - 03/24/20 0001      Functional Gait  Assessment   Gait assessed  Yes    Gait Level Surface Walks 20 ft in less than 5.5 sec, no assistive devices, good speed, no evidence for imbalance, normal gait pattern, deviates no more than 6 in outside of the 12 in walkway width.    Change in Gait Speed Able to smoothly change walking speed without loss of balance or gait deviation. Deviate no more than 6 in outside of the 12 in walkway width.    Gait with Horizontal Head Turns Performs head turns smoothly with slight change in gait velocity (eg, minor disruption to  smooth gait path), deviates 6-10 in outside 12 in walkway width, or uses an assistive device.   mild dizziness   Gait with Vertical Head Turns Performs task with slight change in gait velocity (eg, minor disruption to smooth gait path), deviates 6 - 10 in outside 12 in walkway width or uses assistive device   mild dizziness   Gait and Pivot Turn Pivot turns safely in greater than 3 sec and stops with no loss of balance, or pivot turns safely within 3 sec and stops with mild imbalance, requires small steps to catch balance.    Step Over Obstacle Is able to step over 2 stacked shoe boxes taped together (9 in total height) without changing gait speed. No evidence of imbalance.    Gait with Narrow Base of Support Ambulates 4-7 steps.    Gait with Eyes Closed Walks 20 ft, uses assistive device, slower speed, mild gait deviations, deviates 6-10 in outside 12 in walkway width. Ambulates 20 ft in less than 9 sec but greater than 7 sec.    Ambulating Backwards Walks 20 ft, no assistive devices, good speed, no evidence for imbalance, normal gait    Steps Alternating feet, no rail.    Total Score 24    FGA comment: patient reporting having mild nausea symptoms after completion of horizontal/vertical head turns.                Vestibular Assessment - 03/24/20 0725      Positional Sensitivities   Sit to Supine No dizziness    Supine to Left Side No dizziness    Supine to Right Side Mild dizziness    Supine to Sitting No dizziness    Right Hallpike Mild dizziness    Up from Right Hallpike Mild dizziness    Up from Left Hallpike Mild dizziness    Nose to Right Knee No dizziness    Right Knee to Sitting Mild dizziness    Nose to Left Knee No dizziness    Left Knee to Sitting Mild dizziness    Head Turning x 5 Mild dizziness    Head Nodding x 5 No dizziness    Pivot Right in Standing No dizziness    Pivot Left in Standing No dizziness    Rolling Right Mild dizziness    Rolling Left Mild dizziness                     [facility] Adult PT Treatment/Exercise - 03/24/20 0001      Neuro Re-ed    Neuro Re-ed Details  Reviewed completion of HEP for x 1 viewing to ensure proper completion.  PT Short Term Goals - 03/24/20 0744      PT SHORT TERM GOAL #1   Title Pt will participate in further vestibular, balance, gait assessments - MSQ/DVA, gait velocity, FGA    Baseline FGA/MSQ assessed, Gait Velocity not assessed    Time 4    Period Weeks    Status Partially Met    Target Date 03/22/20      PT SHORT TERM GOAL #2   Title Pt will be able to tolerate treatment for R posterior canal cupulo with resolution of dizziness with bed mobility.    Time 4    Period Weeks    Status New    Target Date 03/22/20      PT SHORT TERM GOAL #3   Title Pt will initiate HEP focusing on vestibular, neck, balance and strengthening    Baseline HEP initiated    Time 4    Period Weeks    Status Achieved    Target Date 03/22/20      PT SHORT TERM GOAL #4   Title Pt will report decreased dizziness with bending forwards to allow her to tie her own shoes and don lower body clothing.    Time 4    Period Weeks    Status New    Target Date 03/22/20             PT Long Term Goals - 03/04/20 2120      PT LONG TERM GOAL #1   Title Pt will report increase in FOTO function to >/= 78% and decrease in [place] by 18 points    Baseline FOTO: 48%, DHI: 68    Time 8    Period Weeks    Status New      PT LONG TERM GOAL #2   Title Pt will report 0/5 dizziness on MSQ to allow pt to return to full independence with driving, ADL, and housework    Time 8    Period Weeks    Status New      PT LONG TERM GOAL #3   Title Pt will demonstrate 4/5 UE and LE strength and demonstrate 10 deg in cervical spine ROM and decrease in neck pain in order to return to driving    Baseline 3+/5, see flow sheet for ROM    Time 8    Period Weeks    Status New      PT LONG TERM GOAL #4   Title  Pt will demonstrate gait velocity >3.5 ft/sec and increase FGA by 4 points to indicate decreased falls risk    Baseline TBD for both    Time 8    Period Weeks    Status New      PT LONG TERM GOAL #5   Title Pt will be independent with final HEP    Time 8    Period Weeks    Status New                 Plan - 03/24/20 0745    Clinical Impression Statement Today's skilled PT session included completion of MSQ and FGA. Patient scoring 24/30 on FGA today, with most difficulty noted with horizontal/vertical head turns and tandem walking. Patient felt nauseous after completion of head turns, but resolved quickly. Reviewed VOR x 1 for proper completion. Began assessment of patient's progress toward STGs today, but unable to finish due to session limited due to patient needing to leave for work.    Personal Factors and  Comorbidities Comorbidity 3+;Profession;Social Background;Transportation    Comorbidities cervicogenic HA, migraine, depression, type 2 DM, dysphagia, GERD, HLD, insomnia, intermittent vertigo, nonallopathic lesions of rib/sacral region/thoracic region, osteoporosis, patellofemoral syndrome of bilat knees, positive rheumatoid factor    Examination-Activity Limitations Bathing;Bed Mobility;Bend;Dressing;Hygiene/Grooming;Locomotion Level;Sleep;Stairs;Transfers    Examination-Participation Restrictions Cleaning;Community Activity;Driving;Laundry;Meal Prep;Occupation    Stability/Clinical Decision Making Evolving/Moderate complexity    Rehab Potential Good    PT Frequency 2x / week    PT Duration 8 weeks    PT Treatment/Interventions ADLs/Self Care Home Management;Aquatic Therapy;Canalith Repostioning;Cryotherapy;Moist Heat;Gait training;Stair training;Functional mobility training;Therapeutic activities;Therapeutic exercise;Balance training;Neuromuscular re-education;Patient/family education;Manual techniques;Passive range of motion;Dry needling;Vestibular    PT Next Visit Plan  Finish checking STGs. Dry Needling/Manual To Neck. Reassess BPPV (pt had Rt anterior cupulo which converted to Lt posterior on 03-03-20), treated L posterior last session with no nystagmus. reassess and treat as indicated  How was HEP? If no BPPV assess MSQ. Need a balance or gait assessment - FGA, gait velocity.  When dizziness has improved also begin to work on endurance, neck ROM/pain, UE and LE strengthening, functional ADL.    Consulted and Agree with Plan of Care Patient           Patient will benefit from skilled therapeutic intervention in order to improve the following deficits and impairments:  Decreased activity tolerance, Decreased balance, Decreased endurance, Decreased range of motion, Decreased strength, Difficulty walking, Dizziness, Pain  Visit Diagnosis: Unsteadiness on feet  Difficulty in walking, not elsewhere classified  Cervicalgia  Dizziness and giddiness     Problem List Patient Active Problem List   Diagnosis Date Noted  . History of COVID-19 02/20/2020  . Fatigue 02/20/2020  . Benign paroxysmal positional vertigo due to bilateral vestibular disorder 02/20/2020  . Physical deconditioning 02/20/2020  . Tachycardia 02/20/2020  . Orthostatic hypotension 02/20/2020  . Myofascial pain syndrome, cervical 02/12/2020  . COVID-19 01/16/2020  . 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 Osteoporosis 04/25/2019  . Right knee pain 01/31/2019  . Anxiety 11/15/2018  . Nonallopathic lesion of rib cage 12/21/2017  . Cervicogenic headache 04/27/2017  . Nonallopathic lesion of cervical region 04/27/2017  . Rheumatoid factor positive 02/10/2016  . Nonallopathic lesion of lumbosacral region 07/29/2015  . Nonallopathic lesion of sacral region 07/29/2015  . Nonallopathic lesion of thoracic region 07/29/2015  . Dysphagia, pharyngoesophageal phase 06/12/2014  . Microcytic anemia 01/21/2011  . Cervical radiculopathy 01/21/2011  . Depression 01/09/2010  .  Migraine without aura 05/15/2009  . Vertigo 05/15/2009  . INSOMNIA-SLEEP DISORDER-UNSPEC 01/04/2008  . Type 2 diabetes mellitus with hyperglycemia, with long-term current use of insulin ([place]) 08/02/2007  . Hyperlipidemia 08/02/2007  . Asthma 08/02/2007  . GERD 08/02/2007    Jones Bales, PT, DPT 03/24/2020, 7:51 AM  [place] [address] [place], [place], [address] Phone: [phone]   Fax:  [phone]  Name: Rachel [name] MRN: [id number] Date of Birth: 1966/01/29

## 2020-03-26 ENCOUNTER — Ambulatory Visit: Payer: 59

## 2020-03-28 ENCOUNTER — Encounter: Payer: Self-pay | Admitting: Physical Therapy

## 2020-03-28 ENCOUNTER — Other Ambulatory Visit: Payer: Self-pay

## 2020-03-28 ENCOUNTER — Ambulatory Visit: Payer: 59 | Admitting: Physical Therapy

## 2020-03-28 DIAGNOSIS — M542 Cervicalgia: Secondary | ICD-10-CM | POA: Diagnosis not present

## 2020-03-28 DIAGNOSIS — R42 Dizziness and giddiness: Secondary | ICD-10-CM

## 2020-03-28 DIAGNOSIS — R2681 Unsteadiness on feet: Secondary | ICD-10-CM | POA: Diagnosis not present

## 2020-03-28 DIAGNOSIS — H8112 Benign paroxysmal vertigo, left ear: Secondary | ICD-10-CM | POA: Diagnosis not present

## 2020-03-28 DIAGNOSIS — M6281 Muscle weakness (generalized): Secondary | ICD-10-CM | POA: Diagnosis not present

## 2020-03-28 DIAGNOSIS — R262 Difficulty in walking, not elsewhere classified: Secondary | ICD-10-CM | POA: Diagnosis not present

## 2020-03-28 NOTE — Patient Instructions (Signed)

## 2020-03-28 NOTE — Therapy (Signed)
[facility] [address] [place], [place], [address] Phone: [phone]   Fax:  [phone]  Physical Therapy Treatment  Patient Details  Name: Rachel [name] MRN: [id number] Date of Birth: 05/20/1966 Referring Provider (PT): Fenton Foy, NP   Encounter Date: 03/28/2020   PT End of Session - 03/28/20 1136    Visit Number 8    Number of Visits 17    Date for PT Re-Evaluation 04/21/20    Authorization Type [facility]; VL: [place]    PT Start Time 0720    PT Stop Time 0800    PT Time Calculation (min) 40 min    Activity Tolerance Patient tolerated treatment well    Behavior During Therapy [facility] for tasks assessed/performed           Past Medical History:  Diagnosis Date  . ALLERGIC RHINITIS 10/04/2007  . Anemia 01/21/2011  . Anxiety 11/15/2018  . ASTHMA 08/02/2007   INHALERS ONLY IN [place]  . Asthma 08/02/2007   Qualifier: Diagnosis of  By: Wynona Luna   . Blood transfusion without reported diagnosis    with first child   . Cervical radiculopathy 01/21/2011  . Cervicogenic headache 04/27/2017  . COMMON MIGRAINE 05/15/2009  . Depression 01/09/2010   Qualifier: Diagnosis of  By: Jenny Reichmann MD, Hunt Oris   . DIABETES MELLITUS, TYPE II 08/02/2007  . Dysphagia, pharyngoesophageal phase 06/12/2014  . GERD 08/02/2007  . HYPERLIPIDEMIA 08/02/2007  . Hyperlipidemia 08/02/2007   Qualifier: Diagnosis of  By: Wynona Luna   . INSOMNIA-SLEEP DISORDER-UNSPEC 01/04/2008  . INTERMITTENT VERTIGO 05/15/2009  . LIBIDO, DECREASED 01/09/2010  . Migraine without aura 05/15/2009   Qualifier: Diagnosis of  By: Jenny Reichmann MD, Hunt Oris   . Nonallopathic lesion of rib cage 12/21/2017  . Nonallopathic lesion of sacral region 08-11-2015  . Nonallopathic lesion of thoracic region 2015-08-11  . Osteoporosis 04/25/2019  . 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 Rheumatoid factor positive 02/10/2016  . Right knee pain 01/31/2019   Injected  January 31, 2019  . Type 2 diabetes mellitus with hyperglycemia, with long-term current use of insulin ([place]) 08/02/2007   Qualifier: Diagnosis of  By: Wynona Luna     Past Surgical History:  Procedure Laterality Date  . CESAREAN SECTION     x 3  . CYST EXCISION     left knee  . PAROTIDECTOMY  10/21/2011   Procedure: PAROTIDECTOMY;  Surgeon: Melida Quitter, MD;  Location: [place];  Service: ENT;  Laterality: Left;    There were no vitals filed for this visit.   Subjective Assessment - 03/28/20 0726    Subjective Pt does not have to work today.  Her mom passed away 2022-08-10 and has been with her family.  Is going to cancel Monday's appointment so she can get rest.  Still having a lot of neck tension, dizziness is better.    Patient is accompained by: Family member    Pertinent History cervicogenic HA, migraine, depression, type 2 DM, dysphagia, GERD, HLD, insomnia, intermittent vertigo, nonallopathic lesions of rib/sacral region/thoracic region, osteoporosis, patellofemoral syndrome of bilat knees, positive rheumatoid factor    Limitations Standing;Walking;House hold activities    Patient Stated Goals To be stronger and be able to do things independently    Currently in Pain? Yes    Pain Score 8     Pain Location Neck    Pain Orientation Right;Left;Posterior    Pain Descriptors /  Indicators Headache;Tightness    Pain Type Chronic pain    Pain Onset 1 to 4 weeks ago                             [facility] Adult PT Treatment/Exercise - 03/28/20 1129      Therapeutic Activites    Therapeutic Activities Other Therapeutic Activities    Other Therapeutic Activities Educated on the use of trigger point dry needling for treatment of ROM and myofascial pain.  Pt screened for any contraindications or precautions, none identified.  Educated pt on side effects and ways to mitigate side effects.      Manual Therapy   Manual Therapy Soft tissue mobilization;Passive ROM    Manual  therapy comments performed in prone after TDN     Soft tissue mobilization STM to upper, middle and lower trapezius on R and L side; STM to cervical paraspinal muscles and suboccipital muscles after TDN    Passive ROM demonstrated to pt how to perform upper trap stretch on each side and recommended pt perform throughout the day today to mitigate soreness            Trigger Point Dry Needling - 03/28/20 1132    Consent Given? Yes    Education Handout Provided Yes    Muscles Treated Head and Neck Upper trapezius;Suboccipitals    Dry Needling Comments Performed in prone; upper trap on R and L side.  Suboccipitals on L side only    Upper Trapezius Response Twitch reponse elicited;Palpable increased muscle length    Suboccipitals Response Twitch response elicited;Palpable increased muscle length                PT Education - 03/28/20 1135    Education Details see TA; upper trap stretch in sitting    Person(s) Educated Patient    Methods Explanation;Handout;Demonstration    Comprehension Verbalized understanding;Returned demonstration          Trigger Point Dry Needling  . What is Trigger Point Dry Needling (DN)? o DN is a physical therapy technique used to treat muscle pain and dysfunction. Specifically, DN helps deactivate muscle trigger points (muscle knots).  o A thin filiform needle is used to penetrate the skin and stimulate the underlying trigger point. The goal is for a local twitch response (LTR) to occur and for the trigger point to relax. No medication of any kind is injected during the procedure.   . What Does Trigger Point Dry Needling Feel Like?  o The procedure feels different for each individual patient. Some patients report that they do not actually feel the needle enter the skin and overall the process is not painful. Very mild bleeding may occur. However, many patients feel a deep cramping in the muscle in which the needle was inserted. This is the local twitch  response.   Marland Kitchen How Will I feel after the treatment? o Soreness is normal, and the onset of soreness may not occur for a few hours. Typically this soreness does not last longer than two days.  o Bruising is uncommon, however; ice can be used to decrease any possible bruising.  o In rare cases feeling tired or nauseous after the treatment is normal. In addition, your symptoms may get worse before they get better, this period will typically not last longer than 24 hours.   . What Can I do After My Treatment? o Increase your hydration by drinking more water for the  next 24 hours. o You may place ice or heat on the areas treated that have become sore, however, do not use heat on inflamed or bruised areas. Heat often brings more relief post needling. o You can continue your regular activities, but vigorous activity is not recommended initially after the treatment for 24 hours. o DN is best combined with other physical therapy such as strengthening, stretching, and other therapies    PT Short Term Goals - 03/24/20 0744      PT SHORT TERM GOAL #1   Title Pt will participate in further vestibular, balance, gait assessments - MSQ/DVA, gait velocity, FGA    Baseline FGA/MSQ assessed, Gait Velocity not assessed    Time 4    Period Weeks    Status Partially Met    Target Date 03/22/20      PT SHORT TERM GOAL #2   Title Pt will be able to tolerate treatment for R posterior canal cupulo with resolution of dizziness with bed mobility.    Time 4    Period Weeks    Status New    Target Date 03/22/20      PT SHORT TERM GOAL #3   Title Pt will initiate HEP focusing on vestibular, neck, balance and strengthening    Baseline HEP initiated    Time 4    Period Weeks    Status Achieved    Target Date 03/22/20      PT SHORT TERM GOAL #4   Title Pt will report decreased dizziness with bending forwards to allow her to tie her own shoes and don lower body clothing.    Time 4    Period Weeks    Status  New    Target Date 03/22/20             PT Long Term Goals - 03/04/20 2120      PT LONG TERM GOAL #1   Title Pt will report increase in FOTO function to >/= 78% and decrease in [place] by 18 points    Baseline FOTO: 48%, DHI: 68    Time 8    Period Weeks    Status New      PT LONG TERM GOAL #2   Title Pt will report 0/5 dizziness on MSQ to allow pt to return to full independence with driving, ADL, and housework    Time 8    Period Weeks    Status New      PT LONG TERM GOAL #3   Title Pt will demonstrate 4/5 UE and LE strength and demonstrate 10 deg in cervical spine ROM and decrease in neck pain in order to return to driving    Baseline 3+/5, see flow sheet for ROM    Time 8    Period Weeks    Status New      PT LONG TERM GOAL #4   Title Pt will demonstrate gait velocity >3.5 ft/sec and increase FGA by 4 points to indicate decreased falls risk    Baseline TBD for both    Time 8    Period Weeks    Status New      PT LONG TERM GOAL #5   Title Pt will be independent with final HEP    Time 8    Period Weeks    Status New                 Plan - 03/28/20 1137    Clinical Impression Statement Pt  continues to present with significant myofascial pain, tension in neck, shoulders resulting in tension HA and decreased cervical ROM.  Addressed today with trigger point dry needling to R and L upper trap and suboccipital muscles with pt experiencing referral of pain in typical referral pattern.  Also performed soft tissue mobilization and educated pt on upper trap stretches.  Will continue to address in order to progress towards LTG.    Personal Factors and Comorbidities Comorbidity 3+;Profession;Social Background;Transportation    Comorbidities cervicogenic HA, migraine, depression, type 2 DM, dysphagia, GERD, HLD, insomnia, intermittent vertigo, nonallopathic lesions of rib/sacral region/thoracic region, osteoporosis, patellofemoral syndrome of bilat knees, positive rheumatoid  factor    Examination-Activity Limitations Bathing;Bed Mobility;Bend;Dressing;Hygiene/Grooming;Locomotion Level;Sleep;Stairs;Transfers    Examination-Participation Restrictions Cleaning;Community Activity;Driving;Laundry;Meal Prep;Occupation    Stability/Clinical Decision Making Evolving/Moderate complexity    Rehab Potential Good    PT Frequency 2x / week    PT Duration 8 weeks    PT Treatment/Interventions ADLs/Self Care Home Management;Aquatic Therapy;Canalith Repostioning;Cryotherapy;Moist Heat;Gait training;Stair training;Functional mobility training;Therapeutic activities;Therapeutic exercise;Balance training;Neuromuscular re-education;Patient/family education;Manual techniques;Passive range of motion;Dry needling;Vestibular    PT Next Visit Plan Finish checking STGs. Dry Needling/Manual To Neck. Reassess BPPV (pt had Rt anterior cupulo which converted to Lt posterior on 03-03-20), treated L posterior last session with no nystagmus. reassess and treat as indicated  How was HEP? If no BPPV assess MSQ. Need a balance or gait assessment - FGA, gait velocity.  When dizziness has improved also begin to work on endurance, neck ROM/pain, UE and LE strengthening, functional ADL.    Consulted and Agree with Plan of Care Patient           Patient will benefit from skilled therapeutic intervention in order to improve the following deficits and impairments:  Decreased activity tolerance, Decreased balance, Decreased endurance, Decreased range of motion, Decreased strength, Difficulty walking, Dizziness, Pain  Visit Diagnosis: Cervicalgia  Dizziness and giddiness     Problem List Patient Active Problem List   Diagnosis Date Noted  . History of COVID-19 02/20/2020  . Fatigue 02/20/2020  . Benign paroxysmal positional vertigo due to bilateral vestibular disorder 02/20/2020  . Physical deconditioning 02/20/2020  . Tachycardia 02/20/2020  . Orthostatic hypotension 02/20/2020  . Myofascial pain  syndrome, cervical 02/12/2020  . COVID-19 01/16/2020  . 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 Osteoporosis 04/25/2019  . Right knee pain 01/31/2019  . Anxiety 11/15/2018  . Nonallopathic lesion of rib cage 12/21/2017  . Cervicogenic headache 04/27/2017  . Nonallopathic lesion of cervical region 04/27/2017  . Rheumatoid factor positive 02/10/2016  . Nonallopathic lesion of lumbosacral region 07/29/2015  . Nonallopathic lesion of sacral region 07/29/2015  . Nonallopathic lesion of thoracic region 07/29/2015  . Dysphagia, pharyngoesophageal phase 06/12/2014  . Microcytic anemia 01/21/2011  . Cervical radiculopathy 01/21/2011  . Depression 01/09/2010  . Migraine without aura 05/15/2009  . Vertigo 05/15/2009  . INSOMNIA-SLEEP DISORDER-UNSPEC 01/04/2008  . Type 2 diabetes mellitus with hyperglycemia, with long-term current use of insulin ([place]) 08/02/2007  . Hyperlipidemia 08/02/2007  . Asthma 08/02/2007  . GERD 08/02/2007    Rico Junker, PT, DPT 03/28/20    11:41 AM    [place] [address] [place], [place], [address] Phone: [phone]   Fax:  [phone]  Name: Rachel [name] MRN: [id number] Date of Birth: Apr 21, 1966

## 2020-03-29 ENCOUNTER — Ambulatory Visit: Payer: 59

## 2020-03-31 ENCOUNTER — Ambulatory Visit: Payer: 59

## 2020-04-02 ENCOUNTER — Ambulatory Visit: Payer: 59

## 2020-04-03 ENCOUNTER — Ambulatory Visit: Payer: 59 | Admitting: Physical Therapy

## 2020-04-03 ENCOUNTER — Ambulatory Visit: Payer: 59 | Admitting: Cardiovascular Disease

## 2020-04-04 ENCOUNTER — Telehealth: Payer: Self-pay | Admitting: Internal Medicine

## 2020-04-04 ENCOUNTER — Other Ambulatory Visit: Payer: Self-pay | Admitting: Internal Medicine

## 2020-04-04 NOTE — Telephone Encounter (Signed)
Done erx 

## 2020-04-04 NOTE — Telephone Encounter (Signed)
Patient states she would like to apply for full time disability and she was informed to reach out to her doctor about what to do. Last seen- 08.12.21 Next apt- 03.17.21 Patient # [phone]

## 2020-04-07 ENCOUNTER — Other Ambulatory Visit: Payer: Self-pay

## 2020-04-07 ENCOUNTER — Other Ambulatory Visit: Payer: Self-pay | Admitting: Internal Medicine

## 2020-04-07 ENCOUNTER — Ambulatory Visit: Payer: 59

## 2020-04-07 DIAGNOSIS — M542 Cervicalgia: Secondary | ICD-10-CM | POA: Diagnosis not present

## 2020-04-07 DIAGNOSIS — R262 Difficulty in walking, not elsewhere classified: Secondary | ICD-10-CM | POA: Diagnosis not present

## 2020-04-07 DIAGNOSIS — R2681 Unsteadiness on feet: Secondary | ICD-10-CM

## 2020-04-07 DIAGNOSIS — H8112 Benign paroxysmal vertigo, left ear: Secondary | ICD-10-CM | POA: Diagnosis not present

## 2020-04-07 DIAGNOSIS — R42 Dizziness and giddiness: Secondary | ICD-10-CM | POA: Diagnosis not present

## 2020-04-07 DIAGNOSIS — M6281 Muscle weakness (generalized): Secondary | ICD-10-CM | POA: Diagnosis not present

## 2020-04-07 NOTE — Telephone Encounter (Signed)
Sent to Dr. John to advise. 

## 2020-04-07 NOTE — Telephone Encounter (Signed)
Very sorry, I am not involved in the disability process  I suspect she would need to contact her HR regarding ST or LT disability and application thereof

## 2020-04-07 NOTE — Telephone Encounter (Signed)
Done erx 

## 2020-04-07 NOTE — Therapy (Signed)
[facility] [address] [place], [place], [address] Phone: [phone]   Fax:  [phone]  Physical Therapy Treatment  Patient Details  Name: Rachel [name] MRN: [id number] Date of Birth: 06/12/1966 Referring Provider (PT): Fenton Foy, NP   Encounter Date: 04/07/2020   PT End of Session - 04/07/20 0724    Visit Number 9    Number of Visits 17    Date for PT Re-Evaluation 04/21/20    Authorization Type [facility]; VL: [place]    PT Start Time 0718    PT Stop Time 0759    PT Time Calculation (min) 41 min    Activity Tolerance Patient tolerated treatment well    Behavior During Therapy [facility] for tasks assessed/performed           Past Medical History:  Diagnosis Date  . ALLERGIC RHINITIS 10/04/2007  . Anemia 01/21/2011  . Anxiety 11/15/2018  . ASTHMA 08/02/2007   INHALERS ONLY IN [place]  . Asthma 08/02/2007   Qualifier: Diagnosis of  By: Wynona Luna   . Blood transfusion without reported diagnosis    with first child   . Cervical radiculopathy 01/21/2011  . Cervicogenic headache 04/27/2017  . COMMON MIGRAINE 05/15/2009  . Depression 01/09/2010   Qualifier: Diagnosis of  By: Jenny Reichmann MD, Hunt Oris   . DIABETES MELLITUS, TYPE II 08/02/2007  . Dysphagia, pharyngoesophageal phase 06/12/2014  . GERD 08/02/2007  . HYPERLIPIDEMIA 08/02/2007  . Hyperlipidemia 08/02/2007   Qualifier: Diagnosis of  By: Wynona Luna   . INSOMNIA-SLEEP DISORDER-UNSPEC 01/04/2008  . INTERMITTENT VERTIGO 05/15/2009  . LIBIDO, DECREASED 01/09/2010  . Migraine without aura 05/15/2009   Qualifier: Diagnosis of  By: Jenny Reichmann MD, Hunt Oris   . Nonallopathic lesion of rib cage 12/21/2017  . Nonallopathic lesion of sacral region 07/29/2015  . Nonallopathic lesion of thoracic region 07/29/2015  . Osteoporosis 04/25/2019  . 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 Rheumatoid factor positive 02/10/2016  . Right knee pain 01/31/2019   Injected  January 31, 2019  . Type 2 diabetes mellitus with hyperglycemia, with long-term current use of insulin ([place]) 08/02/2007   Qualifier: Diagnosis of  By: Wynona Luna     Past Surgical History:  Procedure Laterality Date  . CESAREAN SECTION     x 3  . CYST EXCISION     left knee  . PAROTIDECTOMY  10/21/2011   Procedure: PAROTIDECTOMY;  Surgeon: Melida Quitter, MD;  Location: [facility] [place];  Service: ENT;  Laterality: Left;    There were no vitals filed for this visit.   Subjective Assessment - 04/07/20 0721    Subjective Patient reports that she was sore for about 3 days after. Patient reports she went straight to working, did not apply heat. Reports that she was unsure of benefit, but willing to try again.    Patient is accompained by: Family member    Pertinent History cervicogenic HA, migraine, depression, type 2 DM, dysphagia, GERD, HLD, insomnia, intermittent vertigo, nonallopathic lesions of rib/sacral region/thoracic region, osteoporosis, patellofemoral syndrome of bilat knees, positive rheumatoid factor    Limitations Standing;Walking;House hold activities    Patient Stated Goals To be stronger and be able to do things independently    Currently in Pain? Yes    Pain Score 7     Pain Location Back    Pain Orientation Mid;Upper    Pain Descriptors / Indicators Aching    Pain Type Chronic  pain    Pain Onset 1 to 4 weeks ago               [facility] Adult PT Treatment/Exercise - 04/07/20 0001      Self-Care   Self-Care Heat/Ice Application    Heat/Ice Application PT educating on Heat Application to upper cervical region/mid back area to promote improvements in muscle tension. Educating to complete prior to completing HEP. PT also educating on use of tennis ball to area for self massage to further reduce tension.       Exercises   Exercises Neck;Other Exercises    Other Exercises  see patient instructions for details on medbridge program.      Manual Therapy   Manual Therapy Soft  tissue mobilization    Manual therapy comments performed in prone    Soft tissue mobilization completed STM to bilateral upper/middle and lower trapezius, B levator scapulae, B suboccipitals, B rhomboids, and B cervical paraspinals. Prolonged pressure held on trigger points noted during STM. Patient tolerating STM well, and reports improvements in muscle tension upon completion.            Completed the following exercises and established HEP:   Access Code: [email or web address] URL: [email or web address] Date: 04/07/2020 Prepared by: Jethro Bastos  Exercises Seated Upper Trapezius Stretch - 1-2 x daily - 5 x weekly - 1 sets - 3 reps - 30 hold Seated Levator Scapulae Stretch - 1 x daily - 5 x weekly - 1 sets - 3 reps - 30 hold Seated Assisted Cervical Rotation with Towel - 1 x daily - 5 x weekly - 1 sets - 5 reps - 15 hold Seated Cervical Retraction - 1 x daily - 5 x weekly - 2 sets - 10 reps - verbal cues required for proper completion       PT Education - 04/07/20 0801    Education Details HEP (stretches for neck/posture)    Person(s) Educated Patient    Methods Explanation;Demonstration;Handout    Comprehension Verbalized understanding;Returned demonstration;Need further instruction;Verbal cues required            PT Short Term Goals - 03/24/20 0744      PT SHORT TERM GOAL #1   Title Pt will participate in further vestibular, balance, gait assessments - MSQ/DVA, gait velocity, FGA    Baseline FGA/MSQ assessed, Gait Velocity not assessed    Time 4    Period Weeks    Status Partially Met    Target Date 03/22/20      PT SHORT TERM GOAL #2   Title Pt will be able to tolerate treatment for R posterior canal cupulo with resolution of dizziness with bed mobility.    Time 4    Period Weeks    Status New    Target Date 03/22/20      PT SHORT TERM GOAL #3   Title Pt will initiate HEP focusing on vestibular, neck, balance and strengthening    Baseline HEP initiated     Time 4    Period Weeks    Status Achieved    Target Date 03/22/20      PT SHORT TERM GOAL #4   Title Pt will report decreased dizziness with bending forwards to allow her to tie her own shoes and don lower body clothing.    Time 4    Period Weeks    Status New    Target Date 03/22/20             PT Long  Term Goals - 03/04/20 2120      PT LONG TERM GOAL #1   Title Pt will report increase in FOTO function to >/= 78% and decrease in [place] by 18 points    Baseline FOTO: 48%, DHI: 68    Time 8    Period Weeks    Status New      PT LONG TERM GOAL #2   Title Pt will report 0/5 dizziness on MSQ to allow pt to return to full independence with driving, ADL, and housework    Time 8    Period Weeks    Status New      PT LONG TERM GOAL #3   Title Pt will demonstrate 4/5 UE and LE strength and demonstrate 10 deg in cervical spine ROM and decrease in neck pain in order to return to driving    Baseline 3+/5, see flow sheet for ROM    Time 8    Period Weeks    Status New      PT LONG TERM GOAL #4   Title Pt will demonstrate gait velocity >3.5 ft/sec and increase FGA by 4 points to indicate decreased falls risk    Baseline TBD for both    Time 8    Period Weeks    Status New      PT LONG TERM GOAL #5   Title Pt will be independent with final HEP    Time 8    Period Weeks    Status New                 Plan - 04/07/20 0901    Clinical Impression Statement Initiated HEP targeting neck stretches and cervical ROM to promote reduction in muscle tension and myofascial pain. Patient tolerating all exercises well, vebral cues required for proper completion of cervical retraction.  Continued manual therapy to B cervical paraspinals and B traps to further reduce pain and muscle tension. Patient verbalizing improvements after manual therapy. Will continue to progress toward all LTGs.    Personal Factors and Comorbidities Comorbidity 3+;Profession;Social Background;Transportation     Comorbidities cervicogenic HA, migraine, depression, type 2 DM, dysphagia, GERD, HLD, insomnia, intermittent vertigo, nonallopathic lesions of rib/sacral region/thoracic region, osteoporosis, patellofemoral syndrome of bilat knees, positive rheumatoid factor    Examination-Activity Limitations Bathing;Bed Mobility;Bend;Dressing;Hygiene/Grooming;Locomotion Level;Sleep;Stairs;Transfers    Examination-Participation Restrictions Cleaning;Community Activity;Driving;Laundry;Meal Prep;Occupation    Stability/Clinical Decision Making Evolving/Moderate complexity    Rehab Potential Good    PT Frequency 2x / week    PT Duration 8 weeks    PT Treatment/Interventions ADLs/Self Care Home Management;Aquatic Therapy;Canalith Repostioning;Cryotherapy;Moist Heat;Gait training;Stair training;Functional mobility training;Therapeutic activities;Therapeutic exercise;Balance training;Neuromuscular re-education;Patient/family education;Manual techniques;Passive range of motion;Dry needling;Vestibular    PT Next Visit Plan Dry Needling/Manual To Neck. How was HEP additions? (review as needed). Reassess BPPV (pt had Rt anterior cupulo which converted to Lt posterior on 03-03-20), treated L posterior last session with no nystagmus. reassess and treat as indicated  When dizziness has improved also begin to work on endurance, neck ROM/pain, UE and LE strengthening, functional ADL.    Consulted and Agree with Plan of Care Patient           Patient will benefit from skilled therapeutic intervention in order to improve the following deficits and impairments:  Decreased activity tolerance, Decreased balance, Decreased endurance, Decreased range of motion, Decreased strength, Difficulty walking, Dizziness, Pain  Visit Diagnosis: Cervicalgia  Dizziness and giddiness  Unsteadiness on feet  Difficulty in walking, not elsewhere classified  Problem List Patient Active Problem List   Diagnosis Date Noted  . History of  COVID-19 02/20/2020  . Fatigue 02/20/2020  . Benign paroxysmal positional vertigo due to bilateral vestibular disorder 02/20/2020  . Physical deconditioning 02/20/2020  . Tachycardia 02/20/2020  . Orthostatic hypotension 02/20/2020  . Myofascial pain syndrome, cervical 02/12/2020  . COVID-19 01/16/2020  . 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 Osteoporosis 04/25/2019  . Right knee pain 01/31/2019  . Anxiety 11/15/2018  . Nonallopathic lesion of rib cage 12/21/2017  . Cervicogenic headache 04/27/2017  . Nonallopathic lesion of cervical region 04/27/2017  . Rheumatoid factor positive 02/10/2016  . Nonallopathic lesion of lumbosacral region 07/29/2015  . Nonallopathic lesion of sacral region 07/29/2015  . Nonallopathic lesion of thoracic region 07/29/2015  . Dysphagia, pharyngoesophageal phase 06/12/2014  . Microcytic anemia 01/21/2011  . Cervical radiculopathy 01/21/2011  . Depression 01/09/2010  . Migraine without aura 05/15/2009  . Vertigo 05/15/2009  . INSOMNIA-SLEEP DISORDER-UNSPEC 01/04/2008  . Type 2 diabetes mellitus with hyperglycemia, with long-term current use of insulin ([place]) 08/02/2007  . Hyperlipidemia 08/02/2007  . Asthma 08/02/2007  . GERD 08/02/2007    Jones Bales, PT, DPT 04/07/2020, 9:05 AM  [facility] [address] [place], [place], [address] Phone: [phone]   Fax:  [phone]  Name: Rachel [name] MRN: [id number] Date of Birth: 11/16/1965

## 2020-04-07 NOTE — Patient Instructions (Signed)
Access Code: [email or web address] URL: [email or web address] Date: 04/07/2020 Prepared by: Baldomero Lamy  Exercises Seated Upper Trapezius Stretch - 1-2 x daily - 5 x weekly - 1 sets - 3 reps - 30 hold Seated Levator Scapulae Stretch - 1 x daily - 5 x weekly - 1 sets - 3 reps - 30 hold Seated Assisted Cervical Rotation with Towel - 1 x daily - 5 x weekly - 1 sets - 5 reps - 15 hold Seated Cervical Retraction - 1 x daily - 5 x weekly - 2 sets - 10 reps

## 2020-04-09 ENCOUNTER — Other Ambulatory Visit: Payer: Self-pay | Admitting: Internal Medicine

## 2020-04-09 ENCOUNTER — Ambulatory Visit: Payer: 59

## 2020-04-10 ENCOUNTER — Other Ambulatory Visit: Payer: Self-pay | Admitting: Internal Medicine

## 2020-04-11 ENCOUNTER — Other Ambulatory Visit: Payer: Self-pay | Admitting: Internal Medicine

## 2020-04-11 ENCOUNTER — Ambulatory Visit: Payer: 59 | Admitting: Physical Therapy

## 2020-04-11 ENCOUNTER — Other Ambulatory Visit: Payer: Self-pay

## 2020-04-11 ENCOUNTER — Encounter: Payer: Self-pay | Admitting: Physical Therapy

## 2020-04-11 ENCOUNTER — Telehealth: Payer: Self-pay | Admitting: Internal Medicine

## 2020-04-11 ENCOUNTER — Other Ambulatory Visit (HOSPITAL_COMMUNITY): Payer: Self-pay | Admitting: Internal Medicine

## 2020-04-11 DIAGNOSIS — M542 Cervicalgia: Secondary | ICD-10-CM | POA: Diagnosis not present

## 2020-04-11 DIAGNOSIS — R42 Dizziness and giddiness: Secondary | ICD-10-CM | POA: Diagnosis not present

## 2020-04-11 DIAGNOSIS — M6281 Muscle weakness (generalized): Secondary | ICD-10-CM | POA: Diagnosis not present

## 2020-04-11 DIAGNOSIS — R262 Difficulty in walking, not elsewhere classified: Secondary | ICD-10-CM

## 2020-04-11 DIAGNOSIS — H8112 Benign paroxysmal vertigo, left ear: Secondary | ICD-10-CM | POA: Diagnosis not present

## 2020-04-11 DIAGNOSIS — R2681 Unsteadiness on feet: Secondary | ICD-10-CM

## 2020-04-11 MED ORDER — FREESTYLE LITE TEST VI STRP
ORAL_STRIP | 12 refills | Status: DC
Start: 2020-04-11 — End: 2020-09-16

## 2020-04-11 MED ORDER — FREESTYLE LITE DEVI
0 refills | Status: DC
Start: 2020-04-11 — End: 2020-09-16

## 2020-04-11 NOTE — Therapy (Signed)
[facility] [address] [place], [place], [address] Phone: [phone]   Fax:  [phone]  Physical Therapy Treatment and D/C Summary  Patient Details  Name: Rachel [name] MRN: [id number] Date of Birth: [birth year] Referring Provider (PT): Fenton Foy, NP   Encounter Date: 04/11/2020   PT End of Session - 04/11/20 1129    Visit Number 10    Number of Visits 17    Date for PT Re-Evaluation 04/21/20    Authorization Type [facility]; VL: [place]    PT Start Time 0717    PT Stop Time 0800    PT Time Calculation (min) 43 min    Activity Tolerance Patient limited by pain    Behavior During Therapy [facility] for tasks assessed/performed           Past Medical History:  Diagnosis Date  . ALLERGIC RHINITIS 10/04/2007  . Anemia 01/21/2011  . Anxiety 11/15/2018  . ASTHMA 08/02/2007   INHALERS ONLY IN [place]  . Asthma 08/02/2007   Qualifier: Diagnosis of  By: Wynona Luna   . Blood transfusion without reported diagnosis    with first child   . Cervical radiculopathy 01/21/2011  . Cervicogenic headache 04/27/2017  . COMMON MIGRAINE 05/15/2009  . Depression 01/09/2010   Qualifier: Diagnosis of  By: Jenny Reichmann MD, Hunt Oris   . DIABETES MELLITUS, TYPE II 08/02/2007  . Dysphagia, pharyngoesophageal phase 06/12/2014  . GERD 08/02/2007  . HYPERLIPIDEMIA 08/02/2007  . Hyperlipidemia 08/02/2007   Qualifier: Diagnosis of  By: Wynona Luna   . INSOMNIA-SLEEP DISORDER-UNSPEC 01/04/2008  . INTERMITTENT VERTIGO 05/15/2009  . LIBIDO, DECREASED 01/09/2010  . Migraine without aura 05/15/2009   Qualifier: Diagnosis of  By: Jenny Reichmann MD, Hunt Oris   . Nonallopathic lesion of rib cage 12/21/2017  . Nonallopathic lesion of sacral region 07/29/2015  . Nonallopathic lesion of thoracic region 07/29/2015  . Osteoporosis 04/25/2019  . 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 Rheumatoid factor positive 02/10/2016  . Right knee pain 01/31/2019    Injected January 31, 2019  . Type 2 diabetes mellitus with hyperglycemia, with long-term current use of insulin ([place]) 08/02/2007   Qualifier: Diagnosis of  By: Wynona Luna     Past Surgical History:  Procedure Laterality Date  . CESAREAN SECTION     x 3  . CYST EXCISION     left knee  . PAROTIDECTOMY  10/21/2011   Procedure: PAROTIDECTOMY;  Surgeon: Melida Quitter, MD;  Location: [facility];  Service: ENT;  Laterality: Left;    There were no vitals filed for this visit.   Subjective Assessment - 04/11/20 0727    Subjective Pt reports that today needs to be the last day of therapy.  Isn't sure she will have insurance after this week.  Was let go from her job. No dizziness.    Patient is accompained by: Family member    Pertinent History cervicogenic HA, migraine, depression, type 2 DM, dysphagia, GERD, HLD, insomnia, intermittent vertigo, nonallopathic lesions of rib/sacral region/thoracic region, osteoporosis, patellofemoral syndrome of bilat knees, positive rheumatoid factor    Limitations Standing;Walking;House hold activities    Patient Stated Goals To be stronger and be able to do things independently    Currently in Pain? Yes    Pain Onset 1 to 4 weeks ago              [facility] PT Assessment - 04/11/20 0730  Assessment   Medical Diagnosis Post Covid dizziness, neck pain/HA    Referring Provider (PT) Fenton Foy, NP    Onset Date/Surgical Date 02/20/20    Hand Dominance Right    Prior Therapy yes for knee pain      Precautions   Precautions Other (comment)    Precaution Comments allergies, anemia, anxiety, asthma, blood transfusion, cervical radiculopathy, cervicogenic HA, migraine, depression, type 2 DM, dysphagia, GERD, HLD, insomnia, intermittent vertigo, nonallopathic lesions of rib/sacral region/thoracic region, osteoporosis, patellofemoral syndrome of bilat knees, positive rheumatoid factor      Observation/Other Assessments   Focus on Therapeutic Outcomes  (FOTO)  85%    Dizziness Handicap Inventory (DHI)  10 (mild)      ROM / Strength   AROM / PROM / Strength AROM      AROM   Overall AROM  Deficits    AROM Assessment Site Cervical    Cervical Flexion 50    Cervical Extension 50    Cervical - Right Side Bend 45    Cervical - Left Side Bend 40    Cervical - Right Rotation 60    Cervical - Left Rotation 60      Strength   Overall Strength Deficits    Overall Strength Comments bilat UE: 4-/5; bilat LE: 4-/5 except ankles 3+/5, inconsistent effort.  Pain with resistance      Standardized Balance Assessment   Standardized Balance Assessment 10 meter walk test    10 Meter Walk 9.35 seconds or 3.5 ft/sec      Functional Gait  Assessment   Gait assessed  Yes    Gait Level Surface Walks 20 ft in less than 5.5 sec, no assistive devices, good speed, no evidence for imbalance, normal gait pattern, deviates no more than 6 in outside of the 12 in walkway width.    Change in Gait Speed Able to smoothly change walking speed without loss of balance or gait deviation. Deviate no more than 6 in outside of the 12 in walkway width.    Gait with Horizontal Head Turns Performs head turns smoothly with slight change in gait velocity (eg, minor disruption to smooth gait path), deviates 6-10 in outside 12 in walkway width, or uses an assistive device.    Gait with Vertical Head Turns Performs head turns with no change in gait. Deviates no more than 6 in outside 12 in walkway width.    Gait and Pivot Turn Pivot turns safely within 3 sec and stops quickly with no loss of balance.    Step Over Obstacle Is able to step over 2 stacked shoe boxes taped together (9 in total height) without changing gait speed. No evidence of imbalance.    Gait with Narrow Base of Support Ambulates 7-9 steps.    Gait with Eyes Closed Walks 20 ft, slow speed, abnormal gait pattern, evidence for imbalance, deviates 10-15 in outside 12 in walkway width. Requires more than 9 sec to  ambulate 20 ft.    Ambulating Backwards Walks 20 ft, no assistive devices, good speed, no evidence for imbalance, normal gait    Steps Alternating feet, no rail.    Total Score 26    FGA comment: 26/30 no dizziness               Vestibular Assessment - 04/11/20 0731      Positional Sensitivities   Sit to Supine No dizziness    Supine to Left Side No dizziness    Supine to  Right Side No dizziness    Supine to Sitting No dizziness    Nose to Right Knee No dizziness    Right Knee to Sitting No dizziness    Nose to Left Knee No dizziness    Left Knee to Sitting No dizziness    Head Turning x 5 No dizziness    Head Nodding x 5 No dizziness    Pivot Right in Standing No dizziness    Pivot Left in Standing No dizziness    Rolling Right No dizziness    Rolling Left No dizziness                            PT Education - 04/11/20 1128    Education Details Progress towards goals with dizziness, advised pt to contact HR about COBRA and that pt would need new order to return to continue to address neck pain/impairments and strength impairments    Person(s) Educated Patient    Methods Explanation    Comprehension Verbalized understanding            PT Short Term Goals - 03/24/20 0744      PT SHORT TERM GOAL #1   Title Pt will participate in further vestibular, balance, gait assessments - MSQ/DVA, gait velocity, FGA    Baseline FGA/MSQ assessed, Gait Velocity not assessed    Time 4    Period Weeks    Status Partially Met    Target Date 03/22/20      PT SHORT TERM GOAL #2   Title Pt will be able to tolerate treatment for R posterior canal cupulo with resolution of dizziness with bed mobility.    Time 4    Period Weeks    Status New    Target Date 03/22/20      PT SHORT TERM GOAL #3   Title Pt will initiate HEP focusing on vestibular, neck, balance and strengthening    Baseline HEP initiated    Time 4    Period Weeks    Status Achieved    Target Date  03/22/20      PT SHORT TERM GOAL #4   Title Pt will report decreased dizziness with bending forwards to allow her to tie her own shoes and don lower body clothing.    Time 4    Period Weeks    Status New    Target Date 03/22/20             PT Long Term Goals - 04/11/20 0728      PT LONG TERM GOAL #1   Title Pt will report increase in FOTO function to >/= 78% and decrease in [place] by 18 points    Baseline 85%; 10    Time 8    Period Weeks    Status Achieved      PT LONG TERM GOAL #2   Title Pt will report 0/5 dizziness on MSQ to allow pt to return to full independence with driving, ADL, and housework    Time 8    Period Weeks    Status Achieved      PT LONG TERM GOAL #3   Title Pt will demonstrate 4/5 UE and LE strength and demonstrate 10 deg in cervical spine ROM and decrease in neck pain in order to return to driving    Baseline partially met    Time 8    Period Weeks    Status Partially Met  PT LONG TERM GOAL #4   Title Pt will demonstrate gait velocity >3.5 ft/sec and increase FGA by 4 points to indicate decreased falls risk    Baseline 3.5 ft/sec; 2 point increase in FGA    Time 8    Period Weeks    Status Partially Met      PT LONG TERM GOAL #5   Title Pt will be independent with final HEP    Time 8    Period Weeks    Status Achieved                 Plan - 04/11/20 1131    Clinical Impression Statement Pt requesting to finish with therapy today due to loss of job and financial concerns.  Performed assessment of progress towards LTG.  Pt has made good progress and has met 3/5 LTG, partially meeting other 2.  She reports full resolution of dizziness, 0/5 for all movements on MSQ, has returned to full household activities and reported an improvement in overall function on FOTO to 85% and decrease in [place] to 10.  She also demonstrates improved balance and decreased falls risk as indicated by FGA and gait velocity.  Due to having to D/C early, therapy was  not able to fully address neck pain and decreased ROM.  Pt made slight gains in ROM but pain level remained unchanged.  Pt agreeable to return if she is able to obtain continued coverage of benefits.  Pt is safe for D/C at current functional level.    Personal Factors and Comorbidities Comorbidity 3+;Profession;Social Background;Transportation    Comorbidities cervicogenic HA, migraine, depression, type 2 DM, dysphagia, GERD, HLD, insomnia, intermittent vertigo, nonallopathic lesions of rib/sacral region/thoracic region, osteoporosis, patellofemoral syndrome of bilat knees, positive rheumatoid factor    Examination-Activity Limitations Bathing;Bed Mobility;Bend;Dressing;Hygiene/Grooming;Locomotion Level;Sleep;Stairs;Transfers    Examination-Participation Restrictions Cleaning;Community Activity;Driving;Laundry;Meal Prep;Occupation    Stability/Clinical Decision Making Evolving/Moderate complexity    Rehab Potential Good    PT Frequency 2x / week    PT Duration 8 weeks    PT Treatment/Interventions ADLs/Self Care Home Management;Aquatic Therapy;Canalith Repostioning;Cryotherapy;Moist Heat;Gait training;Stair training;Functional mobility training;Therapeutic activities;Therapeutic exercise;Balance training;Neuromuscular re-education;Patient/family education;Manual techniques;Passive range of motion;Dry needling;Vestibular    Consulted and Agree with Plan of Care Patient           Patient will benefit from skilled therapeutic intervention in order to improve the following deficits and impairments:  Decreased activity tolerance, Decreased balance, Decreased endurance, Decreased range of motion, Decreased strength, Difficulty walking, Dizziness, Pain  Visit Diagnosis: Cervicalgia  Dizziness and giddiness  Unsteadiness on feet  Difficulty in walking, not elsewhere classified  Muscle weakness (generalized)     Problem List Patient Active Problem List   Diagnosis Date Noted  . History of  COVID-19 02/20/2020  . Fatigue 02/20/2020  . Benign paroxysmal positional vertigo due to bilateral vestibular disorder 02/20/2020  . Physical deconditioning 02/20/2020  . Tachycardia 02/20/2020  . Orthostatic hypotension 02/20/2020  . Myofascial pain syndrome, cervical 02/12/2020  . COVID-19 01/16/2020  . 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 Osteoporosis 04/25/2019  . Right knee pain 01/31/2019  . Anxiety 11/15/2018  . Nonallopathic lesion of rib cage 12/21/2017  . Cervicogenic headache 04/27/2017  . Nonallopathic lesion of cervical region 04/27/2017  . Rheumatoid factor positive 02/10/2016  . Nonallopathic lesion of lumbosacral region 07/29/2015  . Nonallopathic lesion of sacral region 07/29/2015  . Nonallopathic lesion of thoracic region 07/29/2015  . Dysphagia, pharyngoesophageal phase 06/12/2014  . Microcytic anemia 01/21/2011  .  Cervical radiculopathy 01/21/2011  . Depression 01/09/2010  . Migraine without aura 05/15/2009  . Vertigo 05/15/2009  . INSOMNIA-SLEEP DISORDER-UNSPEC 01/04/2008  . Type 2 diabetes mellitus with hyperglycemia, with long-term current use of insulin ([place]) 08/02/2007  . Hyperlipidemia 08/02/2007  . Asthma 08/02/2007  . GERD 08/02/2007    PHYSICAL THERAPY DISCHARGE SUMMARY  Visits from Start of Care: 10  Current functional level related to goals / functional outcomes: See LTG achievement and impression statement above.   Remaining deficits: Neck pain and decreased neck ROM   Education / Equipment: HEP  Plan: Patient agrees to discharge.  Patient goals were partially met. Patient is being discharged due to financial reasons.  ?????     Rico Junker, PT, DPT 04/11/20    11:35 AM    [facility] [address] [place], [place], [address] Phone: [phone]   Fax:  [phone]  Name: Rachel [name] MRN: [id number] Date of Birth: 19-Oct-1965

## 2020-04-11 NOTE — Telephone Encounter (Signed)
   Patient states the test strips recently ordered do not work on her glucometer.Pateint requesting new glucometer. She is unsure what brand to order/ or covered by insurance

## 2020-04-14 ENCOUNTER — Ambulatory Visit: Payer: 59

## 2020-04-16 ENCOUNTER — Ambulatory Visit: Payer: 59

## 2020-04-21 ENCOUNTER — Ambulatory Visit: Payer: 59

## 2020-04-23 ENCOUNTER — Ambulatory Visit: Payer: 59

## 2020-05-27 ENCOUNTER — Ambulatory Visit: Payer: 59 | Admitting: Internal Medicine

## 2020-05-30 ENCOUNTER — Ambulatory Visit (INDEPENDENT_AMBULATORY_CARE_PROVIDER_SITE_OTHER): Payer: Self-pay | Admitting: Internal Medicine

## 2020-05-30 ENCOUNTER — Encounter: Payer: Self-pay | Admitting: Internal Medicine

## 2020-05-30 ENCOUNTER — Other Ambulatory Visit: Payer: Self-pay

## 2020-05-30 VITALS — BP 120/78 | HR 103 | Temp 98.4°F | Ht 60.0 in | Wt 140.0 lb

## 2020-05-30 DIAGNOSIS — Z794 Long term (current) use of insulin: Secondary | ICD-10-CM

## 2020-05-30 DIAGNOSIS — R5381 Other malaise: Secondary | ICD-10-CM

## 2020-05-30 DIAGNOSIS — E1165 Type 2 diabetes mellitus with hyperglycemia: Secondary | ICD-10-CM

## 2020-05-30 DIAGNOSIS — F4321 Adjustment disorder with depressed mood: Secondary | ICD-10-CM

## 2020-05-30 NOTE — Progress Notes (Deleted)
   Subjective:    Patient ID: Rachel [name], female    DOB: 1966/03/06, 54 y.o.   MRN: [id number]  HPI     Recently fired per [facility] system for impropre use or employer ID by another per son    Review of Systems     Objective:   Physical Exam        Assessment & Plan:

## 2020-05-30 NOTE — Patient Instructions (Addendum)
Your paperwork will be finished and sent soon  Please continue all other medications as before, and refills have been done if requested.  Please have the pharmacy call with any other refills you may need.  Please continue your efforts at being more active, low cholesterol diet, and weight control  Please keep your appointments with your specialists as you may have planned  Please make an Appointment to return in Aug 28, 2020 (you have an appt already)

## 2020-06-15 ENCOUNTER — Encounter: Payer: Self-pay | Admitting: Internal Medicine

## 2020-06-15 DIAGNOSIS — F4321 Adjustment disorder with depressed mood: Secondary | ICD-10-CM | POA: Insufficient documentation

## 2020-06-15 DIAGNOSIS — R5381 Other malaise: Secondary | ICD-10-CM | POA: Insufficient documentation

## 2020-06-15 NOTE — Assessment & Plan Note (Signed)
Pt declines referral counseling or other mediation change today

## 2020-06-15 NOTE — Assessment & Plan Note (Signed)
Lab Results  Component Value Date   HGBA1C 10.0 (H) 08/29/2019  has been severe uncontrolled, urged pt to f/u with endo as planned but unclear as currently has no health insurance

## 2020-06-15 NOTE — Progress Notes (Signed)
Established Patient Office Visit  Subjective:  Patient ID: Rachel [name], female    DOB: 08-Feb-1966  Age: 55 y.o. MRN: [id number]       Chief Complaint: (concise statement describing the symptom, problem, condition, diagnosis, physician recommended return, or other factor as reason for encounter) follow up DM, grief, debility for forms to be signed       HPI:  Rachel [name] is a 55 y.o. female here to f/u; overall doing ok,  Pt denies chest pain, increasing sob or doe, wheezing, orthopnea, PND, increased LE swelling, palpitations, dizziness or syncope.  Pt denies new neurological symptoms such as new headache, or facial or extremity weakness or numbness.  Pt denies polydipsia, polyuria, or symptomatic low sugars. Pt states overall good compliance with meds, mostly trying to follow appropriate diet, with wt overall stable,  but little exercise however due to multiple co morbids.  Pt was recently fired by [employer] system for improper use of employee ID by another person, which she denies.  She is currently applying for [employer] Disability for psychiatric and physical concerns and forms to be signed today, including, FMS, chronic pain, frquenty migraines, DM uncontrolled, fatigue worse after covid infection, dizziness chronic, asthma, depression, right knee DJD and to her even worse is the cervical and lumbar spine arthritis/DJD right > left where she has been putting off surgury, can only sit less than 10 min before has to stand, and can only walk less than 1 block before burning pain to the neck and lower back.  Mother also died 2 mo ago, now grieving. Wt Readings from Last 3 Encounters:  05/30/20 140 lb (63.5 kg)  02/20/20 136 lb 0.1 oz (61.7 kg)  01/21/20 136 lb 14.5 oz (62.1 kg)   BP Readings from Last 3 Encounters:  05/30/20 120/78  03/04/20 117/87  02/20/20 94/68       Past Medical History:  Diagnosis Date  . ALLERGIC RHINITIS 10/04/2007  . Anemia 01/21/2011  . Anxiety 11/15/2018  .  ASTHMA 08/02/2007   INHALERS ONLY IN [place]  . Asthma 08/02/2007   Qualifier: Diagnosis of  By: Wynona Luna   . Blood transfusion without reported diagnosis    with first child   . Cervical radiculopathy 01/21/2011  . Cervicogenic headache 04/27/2017  . COMMON MIGRAINE 05/15/2009  . Depression 01/09/2010   Qualifier: Diagnosis of  By: Jenny Reichmann MD, Hunt Oris   . DIABETES MELLITUS, TYPE II 08/02/2007  . Dysphagia, pharyngoesophageal phase 06/12/2014  . GERD 08/02/2007  . HYPERLIPIDEMIA 08/02/2007  . Hyperlipidemia 08/02/2007   Qualifier: Diagnosis of  By: Wynona Luna   . INSOMNIA-SLEEP DISORDER-UNSPEC 01/04/2008  . INTERMITTENT VERTIGO 05/15/2009  . LIBIDO, DECREASED 01/09/2010  . Migraine without aura 05/15/2009   Qualifier: Diagnosis of  By: Jenny Reichmann MD, Hunt Oris   . Nonallopathic lesion of rib cage 12/21/2017  . Nonallopathic lesion of sacral region 07/29/2015  . Nonallopathic lesion of thoracic region 07/29/2015  . Osteoporosis 04/25/2019  . 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 Rheumatoid factor positive 02/10/2016  . Right knee pain 01/31/2019   Injected January 31, 2019  . Type 2 diabetes mellitus with hyperglycemia, with long-term current use of insulin ([place]) 08/02/2007   Qualifier: Diagnosis of  By: Wynona Luna    Past Surgical History:  Procedure Laterality Date  . CESAREAN SECTION     x 3  . CYST EXCISION     left knee  . PAROTIDECTOMY  10/21/2011   Procedure: PAROTIDECTOMY;  Surgeon: Melida Quitter, MD;  Location: [facility];  Service: ENT;  Laterality: Left;    reports that she has never smoked. She has never used smokeless tobacco. She reports that she does not drink alcohol and does not use drugs. family history includes Asthma in her father; Cervical cancer in her maternal aunt; Diabetes in her father; Heart disease in her maternal aunt; Hypertension in her father. Allergies  Allergen Reactions  . Lipitor [Atorvastatin]     REACTION: myalgias  . Nitroglycerin     Current Outpatient Medications on File Prior to Visit  Medication Sig Dispense Refill  . albuterol (VENTOLIN HFA) 108 (90 Base) MCG/ACT inhaler Inhale 2 puffs into the lungs every 6 (six) hours as needed for wheezing or shortness of breath. 18 g 5  . aspirin 81 MG EC tablet Take 81 mg by mouth daily.    Marland Kitchen atenolol (TENORMIN) 25 MG tablet Take 1 tablet (25 mg total) by mouth daily. X 7 days, then increase to 2 tabs (50mg ) daily 60 tablet 3  . Blood Glucose Monitoring Suppl (FREESTYLE LITE) DEVI Use as directed four times daily E11.9 1 each 0  . butalbital-acetaminophen-caffeine (FIORICET) 50-325-40 MG tablet TAKE 1 TABLET BY MOUTH EVERY 6 HOURS AS NEEDED FOR HEADACHE. MAX 1 OR 2 TABLETS PER DAY. 30 tablet 3  . Cholecalciferol (VITAMIN D) 50 MCG (2000 UT) CAPS Take 4,000 Units by mouth daily.    . cyclobenzaprine (FLEXERIL) 5 MG tablet Take 1 tablet (5 mg total) by mouth 3 (three) times daily as needed for muscle spasms. 90 tablet 1  . Diclofenac Sodium (PENNSAID) 2 % SOLN Place 2 g onto the skin 2 (two) times daily. 112 g 3  . esomeprazole (NEXIUM) 40 MG capsule TAKE 1 CAPSULE BY MOUTH 2 TIMES DAILY BEFORE A MEAL. 180 capsule 3  . ferrous sulfate 324 MG TBEC Take 324 mg by mouth daily.    . fexofenadine (ALLEGRA) 180 MG tablet Take 1 tablet (180 mg total) by mouth daily. 30 tablet 2  . Fremanezumab-vfrm (AJOVY) 225 MG/1.5ML SOAJ Inject 225 mg into the skin every 30 (thirty) days. 1 pen 11  . gabapentin (NEURONTIN) 300 MG capsule TAKE 1 CAPSULE BY MOUTH 2 TIMES DAILY. (Patient taking differently: Take 300 mg by mouth in the morning and at bedtime. Marland Kitchen) 60 capsule 5  . glucose blood (FREESTYLE LITE) test strip USE AS INSTRUCTED FOUR TIMES DAILY E11.9 400 strip 12  . HUMALOG KWIKPEN 100 UNIT/ML KwikPen INJECT 15 UNITS UNDER THE SKIN THREE TIMES DAILY WITH MEALS 39 mL 3  . HYDROcodone-homatropine (HYCODAN) 5-1.5 MG/5ML syrup Take 5 mLs by mouth at bedtime as needed for cough. 75 mL 0  . Insulin  Glargine (BASAGLAR KWIKPEN) 100 UNIT/ML Inject 0.34 mLs (34 Units total) into the skin daily. 30 mL 11  . Lancets (FREESTYLE) lancets USE AS INSTRUCTED FOUR TIMES DAILY E11.9 400 each 12  . Lasmiditan Succinate (REYVOW) 100 MG TABS Take 100 mg by mouth as needed. Take 1 tablet for headache. No more than 1 tablet in 24 hours 8 tablet 3  . LINZESS 145 MCG CAPS capsule TAKE 1 CAPSULE (145 MCG TOTAL) BY MOUTH DAILY BEFORE BREAKFAST. 30 capsule 2  . meclizine (ANTIVERT) 12.5 MG tablet Take 1 tablet (12.5 mg total) by mouth 3 (three) times daily as needed for dizziness. 40 tablet 1  . meloxicam (MOBIC) 15 MG tablet Take 1 tablet (15 mg total) by mouth daily as needed for  pain. 30 tablet 1  . metoCLOPramide (REGLAN) 5 MG tablet Take 1 tablet (5 mg total) by mouth every 8 (eight) hours as needed for nausea or vomiting. 40 tablet 1  . Multiple Vitamin (MULTIVITAMIN WITH MINERALS) TABS tablet Take 1 tablet by mouth daily.    . naproxen (NAPROSYN) 500 MG tablet Take 1 tablet (500 mg total) by mouth 2 (two) times daily. 30 tablet 0  . nystatin-triamcinolone ointment (MYCOLOG) Apply 1 application topically 2 (two) times daily. 30 g 2  . ondansetron (ZOFRAN ODT) 8 MG disintegrating tablet Take 1 tablet (8 mg total) by mouth every 8 (eight) hours as needed for nausea or vomiting. 30 tablet 0  . promethazine (PHENERGAN) 25 MG tablet TAKE 1 TABLET BY MOUTH EVERY 4-6 HOURS AS NEEDED 12 tablet 0  . promethazine-dextromethorphan (PROMETHAZINE-DM) 6.25-15 MG/5ML syrup Take 5 mLs by mouth 4 (four) times daily as needed for cough. 118 mL 0  . rosuvastatin (CRESTOR) 40 MG tablet TAKE 1 TABLET (40 MG TOTAL) BY MOUTH DAILY. 90 tablet 3  . Semaglutide,0.25 or 0.5MG /DOS, (OZEMPIC, 0.25 OR 0.5 MG/DOSE,) 2 MG/1.5ML SOPN Inject 0.5 mg into the skin once a week. 2 pen 5  . traMADol (ULTRAM) 50 MG tablet TAKE 1 TABLET BY MOUTH EVERY 8 HOURS AS NEEDED 60 tablet 1  . triamcinolone (NASACORT) 55 MCG/ACT AERO nasal inhaler Place 2  sprays into the nose daily. 1 Inhaler 12  . UNIFINE PENTIPS 32G X 4 MM MISC USE 4 TIMES A DAY 200 each PRN  . DULoxetine (CYMBALTA) 60 MG capsule Take 60 mg by mouth daily.     No current facility-administered medications on file prior to visit.        ROS:  All others reviewed and negative.  Objective        PE:  BP 120/78 (BP Location: Left Arm, Patient Position: Sitting, Cuff Size: Large)   Pulse (!) 103   Temp 98.4 F (36.9 C) (Oral)   Ht 5' (1.524 m) Comment: 1  Wt 140 lb (63.5 kg)   SpO2 98%   BMI 27.34 kg/m                 Constitutional: Pt appears in NAD               HENT: Head: NCAT.                Right Ear: External ear normal.                 Left Ear: External ear normal.                Eyes: . Pupils are equal, round, and reactive to light. Conjunctivae and EOM are normal               Nose: without d/c or deformity               Neck: Neck supple. Gross normal ROM               Cardiovascular: Normal rate and regular rhythm.                 Pulmonary/Chest: Effort normal and breath sounds without rales or wheezing.                Abd:  Soft, NT, ND, + BS, no organomegaly               Neurological: Pt is alert. At baseline orientation, motor  grossly intact               Skin: Skin is warm. No rashes, no other new lesions, LE edema - none               Psychiatric: Pt behavior is normal without agitation , depressed affect  Assessment/Plan:  Rachel [name] is a 55 y.o. Other or two or more races [6] female with  has a past medical history of ALLERGIC RHINITIS (10/04/2007), Anemia (01/21/2011), Anxiety (11/15/2018), ASTHMA (08/02/2007), Asthma (08/02/2007), Blood transfusion without reported diagnosis, Cervical radiculopathy (01/21/2011), Cervicogenic headache (04/27/2017), COMMON MIGRAINE (05/15/2009), Depression (01/09/2010), DIABETES MELLITUS, TYPE II (08/02/2007), Dysphagia, pharyngoesophageal phase (06/12/2014), GERD (08/02/2007), HYPERLIPIDEMIA (08/02/2007),  Hyperlipidemia (08/02/2007), INSOMNIA-SLEEP DISORDER-UNSPEC (01/04/2008), INTERMITTENT VERTIGO (05/15/2009), LIBIDO, DECREASED (01/09/2010), Migraine without aura (05/15/2009), Nonallopathic lesion of rib cage (12/21/2017), Nonallopathic lesion of sacral region (07/29/2015), Nonallopathic lesion of thoracic region (07/29/2015), Osteoporosis (04/25/2019), Patellofemoral syndrome of both knees (04/25/2019), Rheumatoid factor positive (02/10/2016), Right knee pain (01/31/2019), and Type 2 diabetes mellitus with hyperglycemia, with long-term current use of insulin (HCC) (08/02/2007).   Assessment Plan  See problem oriented assessment and plan Labs reviewed for each problem: Lab Results  Component Value Date   WBC 8.4 02/20/2020   HGB 12.4 02/20/2020   HCT 37.5 02/20/2020   PLT 367 02/20/2020   GLUCOSE 204 (H) 02/20/2020   CHOL 189 08/29/2019   TRIG 88.0 08/29/2019   HDL 64.10 08/29/2019   LDLDIRECT 191.0 08/23/2018   LDLCALC 108 (H) 08/29/2019   ALT 14 02/20/2020   AST 16 02/20/2020   NA 136 02/20/2020   K 4.9 02/20/2020   CL 96 02/20/2020   CREATININE 0.76 02/20/2020   BUN 8 02/20/2020   CO2 22 02/20/2020   TSH 1.150 02/20/2020   INR 1.08 07/23/2009   HGBA1C 10.0 (H) 08/29/2019   MICROALBUR 2.1 (H) 08/29/2019    Micro: none  Cardiac tracings I have personally interpreted today:  none  Pertinent Radiological findings (summarize): none   I spent total 36 minutes in caring for the patient for this visit:  1) by communicating with the patient and family/caregiver during the visit  2) by review of pertinent vital sign data, physical examination and labs as documented in the assessment and plan  3) by review of pertinent imaging - none today  4) by review of pertinent procedures - none today  5) by obtaining and reviewing separately obtained information from family/caretaker and Care Everywhere - none today  6) by ordering medications- none  7) by ordering tests - none, pt declines  today, no current insurance coverage  8) by documenting all of this clinical information in the EHR including the management of each problem noted today in assessment and plan  Health Maintenance Due  Topic Date Due  . COVID-19 Vaccine (1) Never done  . PAP SMEAR-Modifier  09/10/2019  . INFLUENZA VACCINE  01/13/2020  . HEMOGLOBIN A1C  02/29/2020    There are no preventive care reminders to display for this patient.  Problem List Items Addressed This Visit      Medium   Type 2 diabetes mellitus with hyperglycemia, with long-term current use of insulin (HCC) - Primary    Lab Results  Component Value Date   HGBA1C 10.0 (H) 08/29/2019  has been severe uncontrolled, urged pt to f/u with endo as planned but unclear as currently has no health insurance      Grief    Pt declines referral counseling or other mediation  change today      Debility    Forms filled out and signed today for psychiatric and physical assessment for [employer]         No orders of the defined types were placed in this encounter.   Follow-up: Return in about 3 months (around 08/28/2020).   Cathlean Cower, MD 06/15/2020 1:54 PM [facility]

## 2020-06-15 NOTE — Assessment & Plan Note (Addendum)
Forms filled out and signed today for psychiatric and physical assessment for [employer] disability, [facility]

## 2020-06-16 ENCOUNTER — Other Ambulatory Visit: Payer: Self-pay | Admitting: Internal Medicine

## 2020-06-16 ENCOUNTER — Other Ambulatory Visit: Payer: Self-pay

## 2020-06-16 ENCOUNTER — Telehealth (INDEPENDENT_AMBULATORY_CARE_PROVIDER_SITE_OTHER): Payer: Self-pay | Admitting: Internal Medicine

## 2020-06-16 DIAGNOSIS — E1165 Type 2 diabetes mellitus with hyperglycemia: Secondary | ICD-10-CM

## 2020-06-16 DIAGNOSIS — Z794 Long term (current) use of insulin: Secondary | ICD-10-CM

## 2020-06-16 DIAGNOSIS — J4520 Mild intermittent asthma, uncomplicated: Secondary | ICD-10-CM

## 2020-06-16 DIAGNOSIS — J329 Chronic sinusitis, unspecified: Secondary | ICD-10-CM | POA: Insufficient documentation

## 2020-06-16 MED ORDER — DOXYCYCLINE HYCLATE 100 MG PO TABS: 100.0000 mg | ORAL_TABLET | Freq: Two times a day (BID) | ORAL | 0 refills | Status: DC

## 2020-06-16 MED ORDER — HYDROCODONE-HOMATROPINE 5-1.5 MG/5ML PO SYRP: 5.0000 mL | ORAL_SOLUTION | Freq: Four times a day (QID) | ORAL | 0 refills | Status: DC | PRN

## 2020-06-16 NOTE — Assessment & Plan Note (Addendum)
Mild to mod, for antibx course,  to f/u any worsening symptoms or concerns  I spent total 24 minutes in caring for the patient for this visit:  1) by communicating with the patient and family/caregiver during the visit  2) by review of pertinent vital sign data, physical examination and labs as documented in the assessment and plan  3) by review of pertinent imaging - none today  4) by review of pertinent procedures - none today  5) by obtaining and reviewing separately obtained information from family/caretaker and Care Everywhere - none today  6) by ordering medications  7) by ordering tests - none  8) by documenting all of this clinical information in the EHR including the management of each problem noted today in assessment and plan

## 2020-06-16 NOTE — Patient Instructions (Signed)
Please take all new medication as prescribed 

## 2020-06-16 NOTE — Assessment & Plan Note (Signed)
stable overall by history and exam, recent data reviewed with pt, and pt to continue medical treatment as before,  to f/u any worsening symptoms or concerns  

## 2020-06-16 NOTE — Progress Notes (Signed)
Patient ID: Rachel [name], female   DOB: Aug 13, 1965, 55 y.o.   MRN: [id number]  Virtual Visit via Video Note  I connected with Rachel [name] on 06/16/20 at  2:40 PM EST by a video enabled telemedicine application and verified that I am speaking with the correct person using two identifiers.  Location of all participants today Patient: at home Provider: at office   I discussed the limitations of evaluation and management by telemedicine and the availability of in person appointments. The patient expressed understanding and agreed to proceed.  History of Present Illness:  Here with 2-3 days acute onset fever, facial pain, pressure, headache, general weakness and malaise, and greenish d/c, with mild ST and cough, but pt denies chest pain, wheezing, increased sob or doe, orthopnea, PND, increased LE swelling, palpitations, dizziness or syncope. Is covid neg 2 days  All family she was exposed to recently all tested neg as well.  Pt denies new neurological symptoms such as new headache, or facial or extremity weakness or numbness   Pt denies polydipsia, polyuria Past Medical History:  Diagnosis Date  . ALLERGIC RHINITIS 10/04/2007  . Anemia 01/21/2011  . Anxiety 11/15/2018  . ASTHMA 08/02/2007   INHALERS ONLY IN [place]  . Asthma 08/02/2007   Qualifier: Diagnosis of  By: Wynona Luna   . Blood transfusion without reported diagnosis    with first child   . Cervical radiculopathy 01/21/2011  . Cervicogenic headache 04/27/2017  . COMMON MIGRAINE 05/15/2009  . Depression 01/09/2010   Qualifier: Diagnosis of  By: Jenny Reichmann MD, Hunt Oris   . DIABETES MELLITUS, TYPE II 08/02/2007  . Dysphagia, pharyngoesophageal phase 06/12/2014  . GERD 08/02/2007  . HYPERLIPIDEMIA 08/02/2007  . Hyperlipidemia 08/02/2007   Qualifier: Diagnosis of  By: Wynona Luna   . INSOMNIA-SLEEP DISORDER-UNSPEC 01/04/2008  . INTERMITTENT VERTIGO 05/15/2009  . LIBIDO, DECREASED 01/09/2010  . Migraine without aura 05/15/2009    Qualifier: Diagnosis of  By: Jenny Reichmann MD, Hunt Oris   . Nonallopathic lesion of rib cage 12/21/2017  . Nonallopathic lesion of sacral region 07/29/2015  . Nonallopathic lesion of thoracic region 07/29/2015  . Osteoporosis 04/25/2019  . 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 Rheumatoid factor positive 02/10/2016  . Right knee pain 01/31/2019   Injected January 31, 2019  . Type 2 diabetes mellitus with hyperglycemia, with long-term current use of insulin ([place]) 08/02/2007   Qualifier: Diagnosis of  By: Wynona Luna    Past Surgical History:  Procedure Laterality Date  . CESAREAN SECTION     x 3  . CYST EXCISION     left knee  . PAROTIDECTOMY  10/21/2011   Procedure: PAROTIDECTOMY;  Surgeon: Melida Quitter, MD;  Location: [facility];  Service: ENT;  Laterality: Left;    reports that she has never smoked. She has never used smokeless tobacco. She reports that she does not drink alcohol and does not use drugs. family history includes Asthma in her father; Cervical cancer in her maternal aunt; Diabetes in her father; Heart disease in her maternal aunt; Hypertension in her father. Allergies  Allergen Reactions  . Lipitor [Atorvastatin]     REACTION: myalgias  . Nitroglycerin    Current Outpatient Medications on File Prior to Visit  Medication Sig Dispense Refill  . albuterol (VENTOLIN HFA) 108 (90 Base) MCG/ACT inhaler Inhale 2 puffs into the lungs every 6 (six) hours as needed for wheezing or shortness of breath. 18 g 5  .  aspirin 81 MG EC tablet Take 81 mg by mouth daily.    Marland Kitchen atenolol (TENORMIN) 25 MG tablet Take 1 tablet (25 mg total) by mouth daily. X 7 days, then increase to 2 tabs (50mg ) daily 60 tablet 3  . Blood Glucose Monitoring Suppl (FREESTYLE LITE) DEVI Use as directed four times daily E11.9 1 each 0  . butalbital-acetaminophen-caffeine (FIORICET) 50-325-40 MG tablet TAKE 1 TABLET BY MOUTH EVERY 6 HOURS AS NEEDED FOR HEADACHE. MAX 1 OR 2 TABLETS PER DAY. 30 tablet 3  .  Cholecalciferol (VITAMIN D) 50 MCG (2000 UT) CAPS Take 4,000 Units by mouth daily.    . cyclobenzaprine (FLEXERIL) 5 MG tablet Take 1 tablet (5 mg total) by mouth 3 (three) times daily as needed for muscle spasms. 90 tablet 1  . Diclofenac Sodium (PENNSAID) 2 % SOLN Place 2 g onto the skin 2 (two) times daily. 112 g 3  . DULoxetine (CYMBALTA) 60 MG capsule Take 60 mg by mouth daily.    esomeprazole (NEXIUM) 40 MG capsule TAKE 1 CAPSULE BY MOUTH 2 TIMES DAILY BEFORE A MEAL. 180 capsule 3  . ferrous sulfate 324 MG TBEC Take 324 mg by mouth daily.    . fexofenadine (ALLEGRA) 180 MG tablet Take 1 tablet (180 mg total) by mouth daily. 30 tablet 2  . Fremanezumab-vfrm (AJOVY) 225 MG/1.5ML SOAJ Inject 225 mg into the skin every 30 (thirty) days. 1 pen 11  . gabapentin (NEURONTIN) 300 MG capsule TAKE 1 CAPSULE BY MOUTH 2 TIMES DAILY. (Patient taking differently: Take 300 mg by mouth in the morning and at bedtime. Marland Kitchen) 60 capsule 5  . glucose blood (FREESTYLE LITE) test strip USE AS INSTRUCTED FOUR TIMES DAILY E11.9 400 strip 12  . HUMALOG KWIKPEN 100 UNIT/ML KwikPen INJECT 15 UNITS UNDER THE SKIN THREE TIMES DAILY WITH MEALS 39 mL 3  . Insulin Glargine (BASAGLAR KWIKPEN) 100 UNIT/ML Inject 0.34 mLs (34 Units total) into the skin daily. 30 mL 11  . Lancets (FREESTYLE) lancets USE AS INSTRUCTED FOUR TIMES DAILY E11.9 400 each 12  . Lasmiditan Succinate (REYVOW) 100 MG TABS Take 100 mg by mouth as needed. Take 1 tablet for headache. No more than 1 tablet in 24 hours 8 tablet 3  . LINZESS 145 MCG CAPS capsule TAKE 1 CAPSULE (145 MCG TOTAL) BY MOUTH DAILY BEFORE BREAKFAST. 30 capsule 2  . meclizine (ANTIVERT) 12.5 MG tablet Take 1 tablet (12.5 mg total) by mouth 3 (three) times daily as needed for dizziness. 40 tablet 1  . meloxicam (MOBIC) 15 MG tablet Take 1 tablet (15 mg total) by mouth daily as needed for pain. 30 tablet 1  . metoCLOPramide (REGLAN) 5 MG tablet Take 1 tablet (5 mg total) by mouth every 8  (eight) hours as needed for nausea or vomiting. 40 tablet 1  . Multiple Vitamin (MULTIVITAMIN WITH MINERALS) TABS tablet Take 1 tablet by mouth daily.    . naproxen (NAPROSYN) 500 MG tablet Take 1 tablet (500 mg total) by mouth 2 (two) times daily. 30 tablet 0  . nystatin-triamcinolone ointment (MYCOLOG) Apply 1 application topically 2 (two) times daily. 30 g 2  . ondansetron (ZOFRAN ODT) 8 MG disintegrating tablet Take 1 tablet (8 mg total) by mouth every 8 (eight) hours as needed for nausea or vomiting. 30 tablet 0  . promethazine (PHENERGAN) 25 MG tablet TAKE 1 TABLET BY MOUTH EVERY 4-6 HOURS AS NEEDED 12 tablet 0  . promethazine-dextromethorphan (PROMETHAZINE-DM) 6.25-15 MG/5ML syrup Take 5 mLs  by mouth 4 (four) times daily as needed for cough. 118 mL 0  . rosuvastatin (CRESTOR) 40 MG tablet TAKE 1 TABLET (40 MG TOTAL) BY MOUTH DAILY. 90 tablet 3  . Semaglutide,0.25 or 0.5MG /DOS, (OZEMPIC, 0.25 OR 0.5 MG/DOSE,) 2 MG/1.5ML SOPN Inject 0.5 mg into the skin once a week. 2 pen 5  . traMADol (ULTRAM) 50 MG tablet TAKE 1 TABLET BY MOUTH EVERY 8 HOURS AS NEEDED 60 tablet 1  . triamcinolone (NASACORT) 55 MCG/ACT AERO nasal inhaler Place 2 sprays into the nose daily. 1 Inhaler 12  . UNIFINE PENTIPS 32G X 4 MM MISC USE 4 TIMES A DAY 200 each PRN   No current facility-administered medications on file prior to visit.    Observations/Objective: Alert, NAD, appropriate mood and affect, resps normal, cn 2-12 intact, moves all 4s, no visible rash or swelling Lab Results  Component Value Date   WBC 8.4 02/20/2020   HGB 12.4 02/20/2020   HCT 37.5 02/20/2020   PLT 367 02/20/2020   GLUCOSE 204 (H) 02/20/2020   CHOL 189 08/29/2019   TRIG 88.0 08/29/2019   HDL 64.10 08/29/2019   LDLDIRECT 191.0 08/23/2018   LDLCALC 108 (H) 08/29/2019   ALT 14 02/20/2020   AST 16 02/20/2020   NA 136 02/20/2020   K 4.9 02/20/2020   CL 96 02/20/2020   CREATININE 0.76 02/20/2020   BUN 8 02/20/2020   CO2 22 02/20/2020    TSH 1.150 02/20/2020   INR 1.08 07/23/2009   HGBA1C 10.0 (H) 08/29/2019   MICROALBUR 2.1 (H) 08/29/2019   Assessment and Plan: See notes  Follow Up Instructions: See notes   I discussed the assessment and treatment plan with the patient. The patient was provided an opportunity to ask questions and all were answered. The patient agreed with the plan and demonstrated an understanding of the instructions.   The patient was advised to call back or seek an in-person evaluation if the symptoms worsen or if the condition fails to improve as anticipated.   Cathlean Cower, MD

## 2020-06-16 NOTE — Assessment & Plan Note (Addendum)
stable overall by history and exam, recent data reviewed with pt, and pt to continue medical treatment as before,  to f/u any worsening symptoms or concerns  Lab Results  Component Value Date   HGBA1C 10.0 (H) 08/29/2019

## 2020-06-17 ENCOUNTER — Other Ambulatory Visit: Payer: Self-pay | Admitting: Internal Medicine

## 2020-06-17 ENCOUNTER — Other Ambulatory Visit: Payer: Self-pay

## 2020-06-17 ENCOUNTER — Encounter: Payer: Self-pay | Admitting: Internal Medicine

## 2020-06-17 ENCOUNTER — Ambulatory Visit (INDEPENDENT_AMBULATORY_CARE_PROVIDER_SITE_OTHER): Payer: Self-pay | Admitting: Internal Medicine

## 2020-06-17 VITALS — BP 110/80 | HR 111 | Ht 60.0 in | Wt 140.8 lb

## 2020-06-17 DIAGNOSIS — Z794 Long term (current) use of insulin: Secondary | ICD-10-CM

## 2020-06-17 DIAGNOSIS — E782 Mixed hyperlipidemia: Secondary | ICD-10-CM

## 2020-06-17 DIAGNOSIS — E1165 Type 2 diabetes mellitus with hyperglycemia: Secondary | ICD-10-CM

## 2020-06-17 DIAGNOSIS — E663 Overweight: Secondary | ICD-10-CM

## 2020-06-17 LAB — POCT GLYCOSYLATED HEMOGLOBIN (HGB A1C): Hemoglobin A1C: 7.8 % — AB (ref 4.0–5.6)

## 2020-06-17 MED ORDER — BASAGLAR KWIKPEN 100 UNIT/ML ~~LOC~~ SOPN
32.0000 [IU] | PEN_INJECTOR | Freq: Every day | SUBCUTANEOUS | 11 refills | Status: DC
Start: 2020-06-17 — End: 2020-08-05

## 2020-06-17 MED ORDER — OZEMPIC (0.25 OR 0.5 MG/DOSE) 2 MG/1.5ML ~~LOC~~ SOPN
0.5000 mg | PEN_INJECTOR | SUBCUTANEOUS | 3 refills | Status: DC
Start: 2020-06-17 — End: 2020-06-17

## 2020-06-17 MED ORDER — INSULIN LISPRO (1 UNIT DIAL) 100 UNIT/ML (KWIKPEN)
PEN_INJECTOR | SUBCUTANEOUS | 3 refills | Status: DC
Start: 2020-06-17 — End: 2020-08-05

## 2020-06-17 NOTE — Addendum Note (Signed)
Addended by: Nagee Goates on: 06/17/2020 04:17 PM   Modules accepted: Orders  

## 2020-06-17 NOTE — Progress Notes (Signed)
Subjective:     Patient ID: Rachel [name], female   DOB: December 23, 1965, 55 y.o.   MRN: [id number]  This visit occurred during the SARS-CoV-2 public health emergency.  Safety protocols were in place, including screening questions prior to the visit, additional usage of staff PPE, and extensive cleaning of exam room while observing appropriate contact time as indicated for disinfecting solutions.   HPI Ms. Rachel [name] is a  55 y.o. woman, returning for f/u for DM2, dx 1987, uncontrolled, insulin-dependent, with probable complications (? peripheral neuropathy).  Last visit 9 months ago.  She is usually noncompliant with medications and visits.  She had multiple stressors since last visit: She developed COVID-19 in 12/2019.  Her sugars increased significantly (up to HI) and they also became more fluctuating.  She was admitted to the hospital in DKA.  She slowly recovered after discharge, but still had dizziness/vertigo.  This has now improved.  Sugars started to improve after her discharge.  Reviewed HbA1c levels: Lab Results  Component Value Date   HGBA1C 10.0 (H) 08/29/2019   HGBA1C 11.1 (A) 07/02/2019   HGBA1C 10.2 (H) 11/14/2018  02/17/2017: HbA1c calculated from fructosamine is better, at 8.34%, but still high. Prev. 10.1%.  Reviewed history: She came off all DM medicines for 6 mo before the HbA1c in 09/2013. She again ran out of all meds in 04/2016.  She is  on: - Lantus 36 units in am >> night >> 40 >> 34 >> 36 units in a.m. - Humalog 3-7 >> 8-12 >> 3-7 >> 5-8 >> 6-7 units before meals  - forgets doses 1x a day - Ozempic 0.5 mg weekly - added 06/2019 >> 0.25 mg weekly (?) Stopped Januvia 100 mg in am >> then Ozempic 0.5 mg weekly She is off Metformin XR 1000 mg 2x a day with b'fast and dinner >> upset stomach >> stopped [id number] We triedTrulicity 1.5 mg weekly >> worked great but had to stop b/c GERD >> stopped. We tried Jardiance 25 mg daily >> started 08/2016 >> but stopped recently 2/2  recurrent UTIs She was on Glipizide 5 mg bid - added 04/2015 >> was not taking it b/c low CBGs. We stopped Glipizide XL 5 mg in 07/2014. She had episodes of hypoglycemia with Amaryl in the past. She was on Bydureon 2 mg weekly (had nausea).  She checks her sugars 1-2 times a day: - am: 130s >> 134-200 >> 180-300 >> 138-140 >> 100-130 - after b'fast: 158-251 >> n/c - before lunch: 180-200 >> n/c >> 60 x1 >> n/c - after lunch: up to 200s >> n/c - before dinner:  180-200 >> 137, 150 >> 140-150 - after dinner: 166-245 >> n/c - bedtime: n/c >> 300 >> 100-125 (has snack) >> n/c She has hypoglycemia awareness in the 90s. Lowest: 38 (while on Trulicity) >> [id number] >> [id number] >> 60 x1 - skipped lunch >> 49 (last month) at night - lost consciousness x2. Highest 600 >> ... >> 450 >> 150 >> HI.  She saw nutrition in the past  -+ HL. Last lipids: Lab Results  Component Value Date   CHOL 189 08/29/2019   HDL 64.10 08/29/2019   LDLCALC 108 (H) 08/29/2019   LDLDIRECT 191.0 08/23/2018   TRIG 88.0 08/29/2019   CHOLHDL 3 08/29/2019  On Crestor 40. 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  Review of Systems Constitutional: no weight gain/+ weight loss, no fatigue, no subjective hyperthermia, no subjective hypothermia Eyes: no blurry vision, no xerophthalmia ENT: no sore throat, no nodules palpated in neck, no dysphagia, no odynophagia, no hoarseness Cardiovascular: no CP/no SOB/no palpitations/no leg swelling Respiratory: no cough/no SOB/no wheezing Gastrointestinal: no N/no V/no D/no C/no acid reflux Musculoskeletal: no muscle aches/no joint aches Skin: no rashes, no hair loss Neurological: no tremors/no numbness/no tingling/no dizziness  I reviewed pt's  medications, allergies, PMH, social hx, family hx, and changes were documented in the history of present illness. Otherwise, unchanged from my initial visit note.  Past Medical History:  Diagnosis Date  . ALLERGIC RHINITIS 10/04/2007  . Anemia 01/21/2011  . Anxiety 11/15/2018  . ASTHMA 08/02/2007   INHALERS ONLY IN [place]  . Asthma 08/02/2007   Qualifier: Diagnosis of  By: Nena Jordan   . Blood transfusion without reported diagnosis    with first child   . Cervical radiculopathy 01/21/2011  . Cervicogenic headache 04/27/2017  . COMMON MIGRAINE 05/15/2009  . Depression 01/09/2010   Qualifier: Diagnosis of  By: Jonny Ruiz MD, Len Blalock   . DIABETES MELLITUS, TYPE II 08/02/2007  . Dysphagia, pharyngoesophageal phase 06/12/2014  . GERD 08/02/2007  . HYPERLIPIDEMIA 08/02/2007  . Hyperlipidemia 08/02/2007   Qualifier: Diagnosis of  By: Nena Jordan   . INSOMNIA-SLEEP DISORDER-UNSPEC 01/04/2008  . INTERMITTENT VERTIGO 05/15/2009  . LIBIDO, DECREASED 01/09/2010  . Migraine without aura 05/15/2009   Qualifier: Diagnosis of  By: Jonny Ruiz MD, Len Blalock   . Nonallopathic lesion of rib cage 12/21/2017  . Nonallopathic lesion of sacral region 07/29/2015  . Nonallopathic lesion of thoracic region 07/29/2015  . Osteoporosis 04/25/2019  . 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 Rheumatoid factor positive 02/10/2016  . Right knee pain 01/31/2019   Injected January 31, 2019  . Type 2 diabetes mellitus with hyperglycemia, with long-term current use of insulin (HCC) 08/02/2007   Qualifier: Diagnosis of  By: Nena Jordan    Past Surgical History:  Procedure Laterality Date  . CESAREAN SECTION     x 3  . CYST EXCISION     left knee  . PAROTIDECTOMY  10/21/2011   Procedure: PAROTIDECTOMY;  Surgeon: Christia Reading, MD;  Location: [facility] OR;  Service: ENT;  Laterality: Left;   Social History   Socioeconomic History  . Marital status: Married    Spouse name: Rachel [name]  . Number of children: 3  . Years of  education: Degree  . Highest education level: Not on file  Occupational History  . Occupation: Curator: [employer]  . Occupation: Psychologist, sport and exercise    Employer: [employer]  Tobacco Use  . Smoking status: Never Smoker  . Smokeless tobacco: Never Used  Vaping Use  . Vaping Use: Never used  Substance and Sexual Activity  . Alcohol use: No    Alcohol/week: 0.0 standard drinks  . Drug use: No  . Sexual activity: Not on file  Other Topics Concern  . Not on file  Social History Narrative   She has 3 daughters.   Patient has a 4 year degree.    Patient working at [employer].    Patient is married to [place].   Social Determinants of Health   Financial Resource Strain: Not on  file  Food Insecurity: Not on file  Transportation Needs: Not on file  Physical Activity: Not on file  Stress: Not on file  Social Connections: Not on file  Intimate Partner Violence: Not on file   Current Outpatient Medications on File Prior to Visit  Medication Sig Dispense Refill  . albuterol (VENTOLIN HFA) 108 (90 Base) MCG/ACT inhaler Inhale 2 puffs into the lungs every 6 (six) hours as needed for wheezing or shortness of breath. 18 g 5  . aspirin 81 MG EC tablet Take 81 mg by mouth daily.    Marland Kitchen atenolol (TENORMIN) 25 MG tablet Take 1 tablet (25 mg total) by mouth daily. X 7 days, then increase to 2 tabs (50mg ) daily 60 tablet 3  . Blood Glucose Monitoring Suppl (FREESTYLE LITE) DEVI Use as directed four times daily E11.9 1 each 0  . butalbital-acetaminophen-caffeine (FIORICET) 50-325-40 MG tablet TAKE 1 TABLET BY MOUTH EVERY 6 HOURS AS NEEDED FOR HEADACHE. MAX 1 OR 2 TABLETS PER DAY. 30 tablet 3  . Cholecalciferol (VITAMIN D) 50 MCG (2000 UT) CAPS Take 4,000 Units by mouth daily.    . cyclobenzaprine (FLEXERIL) 5 MG tablet Take 1 tablet (5 mg total) by mouth 3 (three) times daily as needed for muscle spasms. 90 tablet 1  . Diclofenac Sodium (PENNSAID) 2 % SOLN Place 2 g onto the skin  2 (two) times daily. 112 g 3  . doxycycline (VIBRA-TABS) 100 MG tablet Take 1 tablet (100 mg total) by mouth 2 (two) times daily. 20 tablet 0  . DULoxetine (CYMBALTA) 60 MG capsule Take 60 mg by mouth daily.    Marland Kitchen esomeprazole (NEXIUM) 40 MG capsule TAKE 1 CAPSULE BY MOUTH 2 TIMES DAILY BEFORE A MEAL. 180 capsule 3  . ferrous sulfate 324 MG TBEC Take 324 mg by mouth daily.    . fexofenadine (ALLEGRA) 180 MG tablet Take 1 tablet (180 mg total) by mouth daily. 30 tablet 2  . Fremanezumab-vfrm (AJOVY) 225 MG/1.5ML SOAJ Inject 225 mg into the skin every 30 (thirty) days. 1 pen 11  . gabapentin (NEURONTIN) 300 MG capsule TAKE 1 CAPSULE BY MOUTH 2 TIMES DAILY. (Patient taking differently: Take 300 mg by mouth in the morning and at bedtime. Marland Kitchen) 60 capsule 5  . glucose blood (FREESTYLE LITE) test strip USE AS INSTRUCTED FOUR TIMES DAILY E11.9 400 strip 12  . HUMALOG KWIKPEN 100 UNIT/ML KwikPen INJECT 15 UNITS UNDER THE SKIN THREE TIMES DAILY WITH MEALS 39 mL 3  . HYDROcodone-homatropine (HYCODAN) 5-1.5 MG/5ML syrup Take 5 mLs by mouth every 6 (six) hours as needed for cough. 180 mL 0  . Insulin Glargine (BASAGLAR KWIKPEN) 100 UNIT/ML Inject 0.34 mLs (34 Units total) into the skin daily. 30 mL 11  . Lancets (FREESTYLE) lancets USE AS INSTRUCTED FOUR TIMES DAILY E11.9 400 each 12  . Lasmiditan Succinate (REYVOW) 100 MG TABS Take 100 mg by mouth as needed. Take 1 tablet for headache. No more than 1 tablet in 24 hours 8 tablet 3  . LINZESS 145 MCG CAPS capsule TAKE 1 CAPSULE (145 MCG TOTAL) BY MOUTH DAILY BEFORE BREAKFAST. 30 capsule 2  . meclizine (ANTIVERT) 12.5 MG tablet Take 1 tablet (12.5 mg total) by mouth 3 (three) times daily as needed for dizziness. 40 tablet 1  . meloxicam (MOBIC) 15 MG tablet Take 1 tablet (15 mg total) by mouth daily as needed for pain. 30 tablet 1  . metoCLOPramide (REGLAN) 5 MG tablet Take 1 tablet (5 mg total) by  mouth every 8 (eight) hours as needed for nausea or vomiting. 40  tablet 1  . Multiple Vitamin (MULTIVITAMIN WITH MINERALS) TABS tablet Take 1 tablet by mouth daily.    . naproxen (NAPROSYN) 500 MG tablet Take 1 tablet (500 mg total) by mouth 2 (two) times daily. 30 tablet 0  . nystatin-triamcinolone ointment (MYCOLOG) Apply 1 application topically 2 (two) times daily. 30 g 2  . ondansetron (ZOFRAN ODT) 8 MG disintegrating tablet Take 1 tablet (8 mg total) by mouth every 8 (eight) hours as needed for nausea or vomiting. 30 tablet 0  . promethazine (PHENERGAN) 25 MG tablet TAKE 1 TABLET BY MOUTH EVERY 4-6 HOURS AS NEEDED 12 tablet 0  . promethazine-dextromethorphan (PROMETHAZINE-DM) 6.25-15 MG/5ML syrup Take 5 mLs by mouth 4 (four) times daily as needed for cough. 118 mL 0  . rosuvastatin (CRESTOR) 40 MG tablet TAKE 1 TABLET (40 MG TOTAL) BY MOUTH DAILY. 90 tablet 3  . Semaglutide,0.25 or 0.5MG /DOS, (OZEMPIC, 0.25 OR 0.5 MG/DOSE,) 2 MG/1.5ML SOPN Inject 0.5 mg into the skin once a week. 2 pen 5  . traMADol (ULTRAM) 50 MG tablet TAKE 1 TABLET BY MOUTH EVERY 8 HOURS AS NEEDED 60 tablet 1  . triamcinolone (NASACORT) 55 MCG/ACT AERO nasal inhaler Place 2 sprays into the nose daily. 1 Inhaler 12  . UNIFINE PENTIPS 32G X 4 MM MISC USE 4 TIMES A DAY 200 each PRN   No current facility-administered medications on file prior to visit.   Allergies  Allergen Reactions  . Lipitor [Atorvastatin]     REACTION: myalgias  . Nitroglycerin    Family History  Problem Relation Age of Onset  . Hypertension Father   . Diabetes Father   . Asthma Father   . Cervical cancer Maternal Aunt   . Heart disease Maternal Aunt   . Anesthesia problems Neg Hx   . Colon cancer Neg Hx   . Breast cancer Neg Hx     Objective:   Physical Exam BP 110/80   Pulse (!) 111   Ht 5' (1.524 m)   Wt 140 lb 12.8 oz (63.9 kg)   SpO2 98%   BMI 27.50 kg/m  Body mass index is 27.5 kg/m. Wt Readings from Last 3 Encounters:  06/17/20 140 lb 12.8 oz (63.9 kg)  05/30/20 140 lb (63.5 kg)   02/20/20 136 lb 0.1 oz (61.7 kg)   Constitutional: + slightly overweight, in NAD Eyes: PERRLA, EOMI, no exophthalmos ENT: moist mucous membranes, no thyromegaly, no cervical lymphadenopathy Cardiovascular: tachycardia, RR, No MRG Respiratory: CTA B Gastrointestinal: abdomen soft, NT, ND, BS+ Musculoskeletal: no deformities, strength intact in all 4 Skin: moist, warm, no rashes Neurological: no tremor with outstretched hands, DTR normal in all 4  Assessment:     1. DM2, uncontrolled, insulin-dependent, with probable complications - ? peripheral neuropathy  2. HL  3.  Overweight  Plan:     1. Patient with history of uncontrolled type 2 diabetes, on basal-bolus insulin and also weekly GLP-1 receptor agonist, returning after another long absence.  She is usually noncompliant with visits and medications.  At last visit, we decreased the dose of Lantus and increase the dose of Humalog and continue to Ozempic.  At that time, sugars are much better per her recall.  She even had one lower blood sugar in the 60s midday, after she skipped breakfast.  She was taking only 3 to 5 units of Humalog with meals but SVBG his Lantus, I advised him to use  a slightly higher dose of Humalog.  I also gave her a coupon for Ozempic, since this was expensive for her.  We did not change the dose at that time.  HbA1c at last visit was still high: Lab Results  Component Value Date   HGBA1C 10.0 (H) 08/29/2019  -At today's visit, sugars are at goal in the morning and close to goal before dinner.  She is not checking after dinner and I advised her to try to check some blood sugars then.  She increased her Lantus slightly since last visit but she is using slightly lower doses of Humalog been recommended.  She had 2 episodes of loss of consciousness approximately 1 month ago due to low blood sugars in 1 night, when sugars dropped to 49.  He did not have any more episodes since.  However, I would like to decrease the dose  of her Lantus and will continue with the lower doses of Humalog but, since she is taking only 0.25 mg of Ozempic weekly now, I advised her to increase this to 0.5 mg weekly, states she is tolerating this well. - I advised her to: Patient Instructions  Please decrease: - Lantus 32 units in a.m.  Please continue - Humalog 6-7 units 15 min before meals  Please increase: - Ozempic 0.5 mg weekly in a.m.  Please return in 3 months with your sugar log.   - we checked her HbA1c: 7.8% (much better) - advised to check sugars at different times of the day - 3-4x a day, rotating check times - advised for yearly eye exams >> she is UTD - return to clinic in 3 months  2. HL -Reviewed latest lipid panel from 08/2019: LDL slightly above target, much improved, the rest of the fractions at goal: Lab Results  Component Value Date   CHOL 189 08/29/2019   HDL 64.10 08/29/2019   LDLCALC 108 (H) 08/29/2019   LDLDIRECT 191.0 08/23/2018   TRIG 88.0 08/29/2019   CHOLHDL 3 08/29/2019  -At last visit, she was telling that she was not taking Crestor consistently, though we discussed about the importance of doing so.  She is now taking Crestor 40 daily, without side effects.  3.  Overweight -We will continue Ozempic which should also help with weight loss -Before last visit, she lost 3 pounds -During her Covid hospitalization she tells me that she lost a significant amount of weight, as of now, she is net -11 pounds since last visit  Philemon Kingdom, MD PhD [facility]ocr[facility]

## 2020-06-17 NOTE — Patient Instructions (Addendum)
Please decrease: - Lantus 32 units in a.m.  Please continue - Humalog 6-7 units 15 min before meals  Please increase: - Ozempic 0.5 mg weekly in a.m.  Please return in 3 months with your sugar log.

## 2020-06-19 ENCOUNTER — Ambulatory Visit (INDEPENDENT_AMBULATORY_CARE_PROVIDER_SITE_OTHER): Payer: 59

## 2020-06-19 ENCOUNTER — Other Ambulatory Visit: Payer: Self-pay | Admitting: Neurology

## 2020-06-19 ENCOUNTER — Encounter: Payer: Self-pay | Admitting: Neurology

## 2020-06-19 ENCOUNTER — Other Ambulatory Visit: Payer: Self-pay

## 2020-06-19 ENCOUNTER — Ambulatory Visit (INDEPENDENT_AMBULATORY_CARE_PROVIDER_SITE_OTHER): Payer: 59 | Admitting: Neurology

## 2020-06-19 ENCOUNTER — Ambulatory Visit (INDEPENDENT_AMBULATORY_CARE_PROVIDER_SITE_OTHER): Payer: 59 | Admitting: Family Medicine

## 2020-06-19 ENCOUNTER — Encounter: Payer: Self-pay | Admitting: Family Medicine

## 2020-06-19 VITALS — BP 108/72 | HR 98 | Ht 60.0 in | Wt 140.8 lb

## 2020-06-19 VITALS — BP 100/72 | HR 109 | Ht 60.0 in | Wt 140.0 lb

## 2020-06-19 DIAGNOSIS — M545 Low back pain, unspecified: Secondary | ICD-10-CM | POA: Diagnosis not present

## 2020-06-19 DIAGNOSIS — G8929 Other chronic pain: Secondary | ICD-10-CM

## 2020-06-19 DIAGNOSIS — M222X1 Patellofemoral disorders, right knee: Secondary | ICD-10-CM

## 2020-06-19 DIAGNOSIS — M25561 Pain in right knee: Secondary | ICD-10-CM

## 2020-06-19 DIAGNOSIS — G43709 Chronic migraine without aura, not intractable, without status migrainosus: Secondary | ICD-10-CM

## 2020-06-19 DIAGNOSIS — M546 Pain in thoracic spine: Secondary | ICD-10-CM

## 2020-06-19 DIAGNOSIS — M999 Biomechanical lesion, unspecified: Secondary | ICD-10-CM | POA: Diagnosis not present

## 2020-06-19 DIAGNOSIS — M25562 Pain in left knee: Secondary | ICD-10-CM

## 2020-06-19 DIAGNOSIS — M7918 Myalgia, other site: Secondary | ICD-10-CM

## 2020-06-19 DIAGNOSIS — M222X2 Patellofemoral disorders, left knee: Secondary | ICD-10-CM

## 2020-06-19 DIAGNOSIS — R768 Other specified abnormal immunological findings in serum: Secondary | ICD-10-CM

## 2020-06-19 MED ORDER — KETOROLAC TROMETHAMINE 60 MG/2ML IM SOLN
60.0000 mg | Freq: Once | INTRAMUSCULAR | Status: DC
Start: 2020-06-19 — End: 2020-06-19

## 2020-06-19 MED ORDER — DIPHENHYDRAMINE HCL 50 MG/ML IJ SOLN
25.0000 mg | Freq: Once | INTRAMUSCULAR | Status: AC
  Administered 2020-06-19: 25 mg via INTRAMUSCULAR

## 2020-06-19 MED ORDER — METOCLOPRAMIDE HCL 5 MG/ML IJ SOLN
10.0000 mg | Freq: Once | INTRAMUSCULAR | Status: AC
  Administered 2020-06-19: 10 mg via INTRAMUSCULAR

## 2020-06-19 MED ORDER — KETOROLAC TROMETHAMINE 60 MG/2ML IM SOLN
60.0000 mg | Freq: Once | INTRAMUSCULAR | Status: AC
  Administered 2020-06-19: 60 mg via INTRAMUSCULAR

## 2020-06-19 MED ORDER — PREDNISONE 10 MG PO TABS: ORAL_TABLET | ORAL | 0 refills | Status: DC

## 2020-06-19 MED ORDER — GABAPENTIN 300 MG PO CAPS: ORAL_CAPSULE | ORAL | 0 refills | Status: DC

## 2020-06-19 NOTE — Assessment & Plan Note (Signed)
Responding fairly well to osteopathic manipulation.  Patient has responded to epidurals previously but she would like to hold on this.  Patient is no longer working and hopefully this will calm down some of the musculoskeletal complaints.  Will be traveling over to the [place] more often and likely does have a what appears to be an osteopathic physician there as well which will be helpful.  Follow-up with me again in 6 weeks

## 2020-06-19 NOTE — Patient Instructions (Addendum)
Xray today Restart exercises [employer] pride tart cherry extract on Sim Boast could be a good option See me again in 6 weeks

## 2020-06-19 NOTE — Assessment & Plan Note (Signed)
History of patellofemoral arthritis.  We will hold on injections today.  We will start with topical anti-inflammatories, icing regimen.  Patient has multiple different comorbidities that could be contributing to some of the pain.  New x-rays for further evaluation of the arthritic changes but in 2020 MRI did not show anything significant.  Follow-up again in 6 weeks if continuing to have trouble we will consider injections at that time

## 2020-06-19 NOTE — Addendum Note (Signed)
Addended by: Leida Lauth on: 06/19/2020 04:22 PM   Modules accepted: Orders

## 2020-06-19 NOTE — Progress Notes (Signed)
NEUROLOGY FOLLOW UP OFFICE NOTE  Rachel [name] [id number]   Subjective:  Rachel [name] a 55 year old right-handed female with type 2 diabetes, hyperlipidemia, GERD, asthma, and migraine who follows up for migraines.  UPDATE: Switched to Ajovy back in March.  She was averaging 5 to 6 migraines a month.  She had COVID-19 in September and had nausea and dizziness.  Headaches occur 4 to 5 days a week.  Persistent headache for past 2 weeks.  Not taking pain relievers.  Current NSAIDS:none Current analgesics:Excedrin Migraine, Tramadol (for back pain, ineffective for headache, not taken lately) Current triptans:None Current ergotamine:None Current anti-emetic:none Current muscle relaxants:none Current anti-anxiolytic:None Current sleep aide:None Current Antihypertensive medications:None Current Antidepressant medications:Cymbalta 60mg  daily Current Anticonvulsant medications:Gabapentin 300 mgtwice daily(for back pain) Current anti-CGRP: Ajovy Current Vitamins/Herbal/Supplements:Turmeric Current Antihistamines/Decongestants: mecllzine, Allegra  Caffeine:One cup of coffee daily Diet:Drinks plenty of water. No soda. Exercise:No Depression:No; Anxiety:yes. Her mother has been ill. She has been confused.  Other pain:Knee pain. Sleep hygiene:Poor. Worrying about her mother.  HISTORY: Onset:2011.She does have remote history of headaches when she was younger. Location:Back of head and radiates to the front Quality:Shooting up from back of head, pounding on top and front of head Initial Intensity:8/10 Aura:no Prodrome:no Associated symptoms:Nausea, photophobia, phonophobia, blurred vision (she gets annual eye exams due to diabetes) Initial Duration:All day Initial Frequency:Almost daily (cannot function 6 days per month) Triggers/exacerbating factors:wine Relieving factors:No Activity:Cannot work about 6 days per month (she  works as an Advertising account planner)  Past NSAIDS:Cambia (made headache worse),flurbiprofen,Sprix nasal spray, ibuprofen; naproxen Past analgesics:Fioricet, Excedrin, Tylenol Past abortive triptans:Sumatriptan 100 mg, Zembrace-SymTouch (did not like the feeling), Zomig 5 mg NS (did not like the way it made her feel), Maxalt (dizziness, ineffective) Past abortive ergotamine:Cafergot Past muscle relaxants:Robaxin, Flexeril Past anti-emetic:Zofran 8mg , Reglan, Phenergan Past antihypertensive medications:Atenolol Past antidepressant medications:Venlafaxine XR 150 mg; nortriptyline Past anticonvulsant medications:Topiramate, Depakote Past anti-CGRP:Aimovig 140mg , Emgality, Nurtec (dizziness) Past vitamins/Herbal/Supplements:None Past antihistamines/decongestants:None Other past therapies:Botox  Frequent Falls: For the past few years, she has had falls, but they have become more frequent over the past year. Previously, she was diagnosed with vertigo. However, she reports that she doesn't note spinning or lightheadedness. She says she "just falls". On one occasion, she was pushing the seat forward while sitting and just fell over. Otherwise, it always occurs while walking or on her feet. She denies double vision or focal numbness or weakness. MRI of the brain with seizure protocol was performed on 08/02/11, which was unremarkable. Sleep deprived EEG was normal. She has had PT, which was ineffective. She does report that she sometimes trips while walking but doesn't fall. Cervical plain films from 05/07/15 showed mild degenerative disc disease at C5-6, but otherwise unremarkable. Lumbar films from 06/10/15 were personally reviewed and were negative. She underwent anothner MRI of brain with and without contrast on 02/10/16, and was stable compared to prior MRI from 2013. NCV-EMG from 05/13/16 was negative for neuropathy or lumbosacral radiculopathy.  PAST MEDICAL  HISTORY: Past Medical History:  Diagnosis Date  . ALLERGIC RHINITIS 10/04/2007  . Anemia 01/21/2011  . Anxiety 11/15/2018  . ASTHMA 08/02/2007   INHALERS ONLY IN [place]  . Asthma 08/02/2007   Qualifier: Diagnosis of  By: Wynona Luna   . Blood transfusion without reported diagnosis    with first child   . Cervical radiculopathy 01/21/2011  . Cervicogenic headache 04/27/2017  . COMMON MIGRAINE 05/15/2009  . Depression 01/09/2010   Qualifier: Diagnosis of  By: Jenny Reichmann MD,  Len Blalock   . DIABETES MELLITUS, TYPE II 08/02/2007  . Dysphagia, pharyngoesophageal phase 06/12/2014  . GERD 08/02/2007  . HYPERLIPIDEMIA 08/02/2007  . Hyperlipidemia 08/02/2007   Qualifier: Diagnosis of  By: Nena Jordan   . INSOMNIA-SLEEP DISORDER-UNSPEC 01/04/2008  . INTERMITTENT VERTIGO 05/15/2009  . LIBIDO, DECREASED 01/09/2010  . Migraine without aura 05/15/2009   Qualifier: Diagnosis of  By: Jonny Ruiz MD, Len Blalock   . Nonallopathic lesion of rib cage 12/21/2017  . Nonallopathic lesion of sacral region 07/29/2015  . Nonallopathic lesion of thoracic region 07/29/2015  . Osteoporosis 04/25/2019  . 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 Rheumatoid factor positive 02/10/2016  . Right knee pain 01/31/2019   Injected January 31, 2019  . Type 2 diabetes mellitus with hyperglycemia, with long-term current use of insulin (HCC) 08/02/2007   Qualifier: Diagnosis of  By: Nena Jordan     MEDICATIONS: Current Outpatient Medications on File Prior to Visit  Medication Sig Dispense Refill  . albuterol (VENTOLIN HFA) 108 (90 Base) MCG/ACT inhaler Inhale 2 puffs into the lungs every 6 (six) hours as needed for wheezing or shortness of breath. 18 g 5  . aspirin 81 MG EC tablet Take 81 mg by mouth daily.    Marland Kitchen atenolol (TENORMIN) 25 MG tablet Take 1 tablet (25 mg total) by mouth daily. X 7 days, then increase to 2 tabs (50mg ) daily 60 tablet 3  . Blood Glucose Monitoring Suppl (FREESTYLE LITE) DEVI Use as directed four times  daily E11.9 1 each 0  . butalbital-acetaminophen-caffeine (FIORICET) 50-325-40 MG tablet TAKE 1 TABLET BY MOUTH EVERY 6 HOURS AS NEEDED FOR HEADACHE. MAX 1 OR 2 TABLETS PER DAY. 30 tablet 3  . Cholecalciferol (VITAMIN D) 50 MCG (2000 UT) CAPS Take 4,000 Units by mouth daily.    . cyclobenzaprine (FLEXERIL) 5 MG tablet Take 1 tablet (5 mg total) by mouth 3 (three) times daily as needed for muscle spasms. 90 tablet 1  . Diclofenac Sodium (PENNSAID) 2 % SOLN Place 2 g onto the skin 2 (two) times daily. 112 g 3  . doxycycline (VIBRA-TABS) 100 MG tablet Take 1 tablet (100 mg total) by mouth 2 (two) times daily. 20 tablet 0  . DULoxetine (CYMBALTA) 60 MG capsule Take 60 mg by mouth daily.    esomeprazole (NEXIUM) 40 MG capsule TAKE 1 CAPSULE BY MOUTH 2 TIMES DAILY BEFORE A MEAL. 180 capsule 3  . fexofenadine (ALLEGRA) 180 MG tablet Take 1 tablet (180 mg total) by mouth daily. 30 tablet 2  . Fremanezumab-vfrm (AJOVY) 225 MG/1.5ML SOAJ Inject 225 mg into the skin every 30 (thirty) days. 1 pen 11  . gabapentin (NEURONTIN) 300 MG capsule TAKE 1 CAPSULE BY MOUTH 2 TIMES DAILY. (Patient taking differently: Take 300 mg by mouth in the morning and at bedtime. Marland Kitchen) 60 capsule 5  . glucose blood (FREESTYLE LITE) test strip USE AS INSTRUCTED FOUR TIMES DAILY E11.9 400 strip 12  . HYDROcodone-homatropine (HYCODAN) 5-1.5 MG/5ML syrup Take 5 mLs by mouth every 6 (six) hours as needed for cough. 180 mL 0  . Insulin Glargine (BASAGLAR KWIKPEN) 100 UNIT/ML Inject 32 Units into the skin daily. 30 mL 11  . insulin lispro (HUMALOG KWIKPEN) 100 UNIT/ML KwikPen INJECT 5-8 UNITS UNDER THE SKIN THREE TIMES DAILY WITH MEALS 39 mL 3  . Lancets (FREESTYLE) lancets USE AS INSTRUCTED FOUR TIMES DAILY E11.9 400 each 12  . Lasmiditan Succinate (REYVOW) 100 MG TABS Take 100 mg  by mouth as needed. Take 1 tablet for headache. No more than 1 tablet in 24 hours 8 tablet 3  . LINZESS 145 MCG CAPS capsule TAKE 1 CAPSULE (145 MCG TOTAL) BY  MOUTH DAILY BEFORE BREAKFAST. 30 capsule 2  . meclizine (ANTIVERT) 12.5 MG tablet Take 1 tablet (12.5 mg total) by mouth 3 (three) times daily as needed for dizziness. 40 tablet 1  . meloxicam (MOBIC) 15 MG tablet Take 1 tablet (15 mg total) by mouth daily as needed for pain. 30 tablet 1  . metoCLOPramide (REGLAN) 5 MG tablet Take 1 tablet (5 mg total) by mouth every 8 (eight) hours as needed for nausea or vomiting. 40 tablet 1  . Multiple Vitamin (MULTIVITAMIN WITH MINERALS) TABS tablet Take 1 tablet by mouth daily.    . naproxen (NAPROSYN) 500 MG tablet Take 1 tablet (500 mg total) by mouth 2 (two) times daily. 30 tablet 0  . nystatin-triamcinolone ointment (MYCOLOG) Apply 1 application topically 2 (two) times daily. 30 g 2  . ondansetron (ZOFRAN ODT) 8 MG disintegrating tablet Take 1 tablet (8 mg total) by mouth every 8 (eight) hours as needed for nausea or vomiting. 30 tablet 0  . promethazine (PHENERGAN) 25 MG tablet TAKE 1 TABLET BY MOUTH EVERY 4-6 HOURS AS NEEDED 12 tablet 0  . promethazine-dextromethorphan (PROMETHAZINE-DM) 6.25-15 MG/5ML syrup Take 5 mLs by mouth 4 (four) times daily as needed for cough. 118 mL 0  . rosuvastatin (CRESTOR) 40 MG tablet TAKE 1 TABLET (40 MG TOTAL) BY MOUTH DAILY. 90 tablet 3  . Semaglutide,0.25 or 0.5MG /DOS, (OZEMPIC, 0.25 OR 0.5 MG/DOSE,) 2 MG/1.5ML SOPN Inject 0.5 mg into the skin once a week. 4.5 mL 3  . traMADol (ULTRAM) 50 MG tablet TAKE 1 TABLET BY MOUTH EVERY 8 HOURS AS NEEDED 60 tablet 1  . triamcinolone (NASACORT) 55 MCG/ACT AERO nasal inhaler Place 2 sprays into the nose daily. 1 Inhaler 12  . UNIFINE PENTIPS 32G X 4 MM MISC USE 4 TIMES A DAY 200 each PRN   No current facility-administered medications on file prior to visit.    ALLERGIES: Allergies  Allergen Reactions  . Lipitor [Atorvastatin]     REACTION: myalgias  . Nitroglycerin     FAMILY HISTORY: Family History  Problem Relation Age of Onset  . Hypertension Father   . Diabetes  Father   . Asthma Father   . Cervical cancer Maternal Aunt   . Heart disease Maternal Aunt   . Anesthesia problems Neg Hx   . Colon cancer Neg Hx   . Breast cancer Neg Hx     SOCIAL HISTORY: Social History   Socioeconomic History  . Marital status: Married    Spouse name: Lars Mage  . Number of children: 3  . Years of education: Degree  . Highest education level: Not on file  Occupational History  . Occupation: Curator: [employer]  . Occupation: Psychologist, sport and exercise    Employer: [employer]  Tobacco Use  . Smoking status: Never Smoker  . Smokeless tobacco: Never Used  Vaping Use  . Vaping Use: Never used  Substance and Sexual Activity  . Alcohol use: No    Alcohol/week: 0.0 standard drinks  . Drug use: No  . Sexual activity: Not on file  Other Topics Concern  . Not on file  Social History Narrative   She has 3 daughters.   Patient has a 4 year degree.    Patient working at [employer].    Patient is married to [place].   Social Determinants of Health   Financial Resource Strain: Not on file  Food Insecurity: Not on file  Transportation Needs: Not on file  Physical Activity: Not on file  Stress: Not on file  Social Connections: Not on file  Intimate Partner Violence: Not on file     Objective:  Blood pressure 108/72, pulse 98, height 5' (1.524 m), weight 140 lb 12.8 oz (63.9 kg), SpO2 98 %. General: No acute distress.  Patient appears well-groomed.   Head:  Normocephalic/atraumatic Eyes:  Fundi examined but not visualized Neck: supple, no paraspinal tenderness, full range of motion Heart:  Regular rate and rhythm Lungs:  Clear to auscultation bilaterally Back: No paraspinal tenderness Neurological Exam: alert and oriented to person, place, and time. Attention span and concentration intact, recent and remote memory intact, fund of knowledge intact.  Speech fluent and not dysarthric, language intact.  CN II-XII intact. Bulk and tone normal, muscle  strength 5/5 throughout.  Sensation to light touch, temperature and vibration intact.  Deep tendon reflexes 2+ throughout, toes downgoing.  Finger to nose and heel to shin testing intact.  Gait normal, Romberg negative.   Assessment/Plan:   1.  Chronic migraine without aura, without status migrainosus, intractable - she was doing well on Ajovy but aggravated since COVID-19.   1.  Continue Ajovy.  Will increase gabapentin to 600mg  twice daily to help reduce migraine frequency.  Once migraines are again controlled, plan would be to decrease back to 300mg  twice daily.  If no improvement in 2 months, will switch Ajovy to another medication. 2.  Will have her try Ubrelvy 100mg  for rescue medication. 3.  Limit use of pain relievers to no more than 2 days out of week to prevent risk of rebound or medication-overuse headache. 4.  Keep headache diary 5.  Due to intractable migraine with status migrainosus for past 2 weeks, will give her a headache cocktail - toradol 60mg 10mg /Benadryl 25mg  6.  Follow up 6 months.  , DO  CC: , MD

## 2020-06-19 NOTE — Progress Notes (Signed)
[place] Phone: [phone] Subjective:   Rachel [name], am serving as a scribe for Dr. Hulan Saas. This visit occurred during the SARS-CoV-2 public health emergency.  Safety protocols were in place, including screening questions prior to the visit, additional usage of staff PPE, and extensive cleaning of exam room while observing appropriate contact time as indicated for disinfecting solutions.   I'm seeing this patient by the request  of:  Biagio Borg, MD  CC:  Neck pain, lumbar pain, knee pain [id number]   10/04/2019 Chronic problem with worsening pain.  Bilateral injections given today.  Tolerated the procedure well and hopefully will continue to improve.  Patient wants to avoid any surgical intervention for her knees with the idea that she may be having it for her neck in the near future.  Topical anti-inflammatories given.  Prescription drug management also discussed.  Cervical radiculopathy.  Worsening pain in the C6 distribution that is corresponding with the osteophyte complex.  Mild benefit with 2 epidurals over the course of time but still having pain that is affecting quality of life.  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  At this point we will refer to neurosurgery for their evaluation and potential treatment.  Update 06/19/2020 Rachel [name] is a 55 y.o. female coming in with complaint of bilateral knee and back pain.   Back pain remains unchanged despite [name] longer working.  Patient states he continues have discomfort and pain.  Patient was staying in the [place] and found somebody else who is doing manipulation which was helping her.  Patient feels like she should not start this again at this time.  Patient states that she is never without some discomfort.  Medications that she is taking regularly at  this moment include patient Cymbalta, Flexeril as well as gabapentin.  Patient just came from neurology for her cervical spine pain and headaches.  Patient was given an injection of Toradol, Reglan and Benadryl in office today.  Has not noticed improvement yet.  Right knee pain worse than left. Has had injections which last for 2 months.  Patient states that it is a grinding sensation.  More of a burning sensation when it occurs as well.  Does not know if there is any swelling but sometimes can affect her going up and down steps or walking long distances  Since we have seen patient patient also did have Covid and then seemed to have a sequelae of BPPV I did take physical therapy to completely resolved.  Past Medical History:  Diagnosis Date  . ALLERGIC RHINITIS 10/04/2007  . Anemia 01/21/2011  . Anxiety 11/15/2018  . ASTHMA 08/02/2007   INHALERS ONLY IN [place]  . Asthma 08/02/2007   Qualifier: Diagnosis of  By: Wynona Luna   . Blood transfusion without reported diagnosis    with first child   . Cervical radiculopathy 01/21/2011  . Cervicogenic headache 04/27/2017  . COMMON MIGRAINE 05/15/2009  . Depression 01/09/2010   Qualifier: Diagnosis of  By: Jenny Reichmann MD, Hunt Oris   . DIABETES MELLITUS, TYPE II 08/02/2007  . Dysphagia, pharyngoesophageal phase 06/12/2014  . GERD 08/02/2007  . HYPERLIPIDEMIA 08/02/2007  . Hyperlipidemia 08/02/2007   Qualifier: Diagnosis of  By: Wynona Luna   . INSOMNIA-SLEEP DISORDER-UNSPEC 01/04/2008  . INTERMITTENT VERTIGO 05/15/2009  . LIBIDO, DECREASED 01/09/2010  . Migraine without aura  05/15/2009   Qualifier: Diagnosis of  By: Jonny Ruiz MD, Len Blalock   . Nonallopathic lesion of rib cage 12/21/2017  . Nonallopathic lesion of sacral region 07/29/2015  . Nonallopathic lesion of thoracic region 07/29/2015  . Osteoporosis 04/25/2019  . 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 Rheumatoid factor positive 02/10/2016  . Right knee pain 01/31/2019   Injected January 31, 2019  . Type 2 diabetes mellitus with hyperglycemia, with long-term current use of insulin (HCC) 08/02/2007   Qualifier: Diagnosis of  By: Nena Jordan    Past Surgical History:  Procedure Laterality Date  . CESAREAN SECTION     x 3  . CYST EXCISION     left knee  . PAROTIDECTOMY  10/21/2011   Procedure: PAROTIDECTOMY;  Surgeon: Christia Reading, MD;  Location: [facility] OR;  Service: ENT;  Laterality: Left;   Social History   Socioeconomic History  . Marital status: Married    Spouse name: Rachel [name]  . Number of children: 3  . Years of education: Degree  . Highest education level: Not on file  Occupational History  . Occupation: Curator: [employer]  . Occupation: Psychologist, sport and exercise    Employer: [employer]  Tobacco Use  . Smoking status: Never Smoker  . Smokeless tobacco: Never Used  Vaping Use  . Vaping Use: Never used  Substance and Sexual Activity  . Alcohol use: [name]    Alcohol/week: 0.0 standard drinks  . Drug use: [name]  . Sexual activity: Not on file  Other Topics Concern  . Not on file  Social History Narrative   She has 3 daughters.   Patient has a 4 year degree.    Patient working at [employer].    Patient is married to [place].   Social Determinants of Health   Financial Resource Strain: Not on file  Food Insecurity: Not on file  Transportation Needs: Not on file  Physical Activity: Not on file  Stress: Not on file  Social Connections: Not on file   Allergies  Allergen Reactions  . Lipitor [Atorvastatin]     REACTION: myalgias  . Nitroglycerin    Family History  Problem Relation Age of Onset  . Hypertension Father   . Diabetes Father   . Asthma Father   . Cervical cancer Maternal Aunt   . Heart disease Maternal Aunt   . Anesthesia problems Neg Hx   . Colon cancer Neg Hx   . Breast cancer Neg Hx     Current Outpatient Medications (Endocrine & Metabolic):  Marland Kitchen  Insulin Glargine (BASAGLAR KWIKPEN) 100 UNIT/ML, Inject 32 Units into the  skin daily. .  insulin lispro (HUMALOG KWIKPEN) 100 UNIT/ML KwikPen, INJECT 5-8 UNITS UNDER THE SKIN THREE TIMES DAILY WITH MEALS .  Semaglutide,0.25 or 0.5MG /DOS, (OZEMPIC, 0.25 OR 0.5 MG/DOSE,) 2 MG/1.5ML SOPN, Inject 0.5 mg into the skin once a week.  Current Outpatient Medications (Cardiovascular):  .  atenolol (TENORMIN) 25 MG tablet, Take 1 tablet (25 mg total) by mouth daily. X 7 days, then increase to 2 tabs (50mg ) daily .  rosuvastatin (CRESTOR) 40 MG tablet, TAKE 1 TABLET (40 MG TOTAL) BY MOUTH DAILY.  Current Outpatient Medications (Respiratory):  .  albuterol (VENTOLIN HFA) 108 (90 Base) MCG/ACT inhaler, Inhale 2 puffs into the lungs every 6 (six) hours as needed for wheezing or shortness of breath. .  fexofenadine (ALLEGRA) 180 MG tablet, Take 1 tablet (180 mg total) by mouth daily.  HYDROcodone-homatropine (HYCODAN) 5-1.5 MG/5ML syrup, Take 5 mLs by mouth every 6 (six) hours as needed for cough. .  promethazine (PHENERGAN) 25 MG tablet, TAKE 1 TABLET BY MOUTH EVERY 4-6 HOURS AS NEEDED .  promethazine-dextromethorphan (PROMETHAZINE-DM) 6.25-15 MG/5ML syrup, Take 5 mLs by mouth 4 (four) times daily as needed for cough. .  triamcinolone (NASACORT) 55 MCG/ACT AERO nasal inhaler, Place 2 sprays into the nose daily.  Current Outpatient Medications (Analgesics):  .  aspirin 81 MG EC tablet, Take 81 mg by mouth daily. .  butalbital-acetaminophen-caffeine (FIORICET) 50-325-40 MG tablet, TAKE 1 TABLET BY MOUTH EVERY 6 HOURS AS NEEDED FOR HEADACHE. MAX 1 OR 2 TABLETS PER DAY. Marland Kitchen  Fremanezumab-vfrm (AJOVY) 225 MG/1.5ML SOAJ, Inject 225 mg into the skin every 30 (thirty) days. [place] Succinate (REYVOW) 100 MG TABS, Take 100 mg by mouth as needed. Take 1 tablet for headache. [name] more than 1 tablet in 24 hours .  meloxicam (MOBIC) 15 MG tablet, Take 1 tablet (15 mg total) by mouth daily as needed for pain. .  naproxen (NAPROSYN) 500 MG tablet, Take 1 tablet (500 mg total) by mouth 2 (two)  times daily. .  traMADol (ULTRAM) 50 MG tablet, TAKE 1 TABLET BY MOUTH EVERY 8 HOURS AS NEEDED   Current Outpatient Medications (Other):  .  Blood Glucose Monitoring Suppl (FREESTYLE LITE) DEVI, Use as directed four times daily E11.9 .  Cholecalciferol (VITAMIN D) 50 MCG (2000 UT) CAPS, Take 4,000 Units by mouth daily. .  cyclobenzaprine (FLEXERIL) 5 MG tablet, Take 1 tablet (5 mg total) by mouth 3 (three) times daily as needed for muscle spasms. .  Diclofenac Sodium (PENNSAID) 2 % SOLN, Place 2 g onto the skin 2 (two) times daily. Marland Kitchen  doxycycline (VIBRA-TABS) 100 MG tablet, Take 1 tablet (100 mg total) by mouth 2 (two) times daily. .  DULoxetine (CYMBALTA) 60 MG capsule, Take 60 mg by mouth daily. Marland Kitchen  esomeprazole (NEXIUM) 40 MG capsule, TAKE 1 CAPSULE BY MOUTH 2 TIMES DAILY BEFORE A MEAL. Marland Kitchen  gabapentin (NEURONTIN) 300 MG capsule, Take 1 capsule in the morning and 2 capsules at night for one week, then 2 capsules twice daily .  glucose blood (FREESTYLE LITE) test strip, USE AS INSTRUCTED FOUR TIMES DAILY E11.9 .  Lancets (FREESTYLE) lancets, USE AS INSTRUCTED FOUR TIMES DAILY E11.9 .  LINZESS 145 MCG CAPS capsule, TAKE 1 CAPSULE (145 MCG TOTAL) BY MOUTH DAILY BEFORE BREAKFAST. Marland Kitchen  meclizine (ANTIVERT) 12.5 MG tablet, Take 1 tablet (12.5 mg total) by mouth 3 (three) times daily as needed for dizziness. .  metoCLOPramide (REGLAN) 5 MG tablet, Take 1 tablet (5 mg total) by mouth every 8 (eight) hours as needed for nausea or vomiting. .  Multiple Vitamin (MULTIVITAMIN WITH MINERALS) TABS tablet, Take 1 tablet by mouth daily. Marland Kitchen  nystatin-triamcinolone ointment (MYCOLOG), Apply 1 application topically 2 (two) times daily. .  ondansetron (ZOFRAN ODT) 8 MG disintegrating tablet, Take 1 tablet (8 mg total) by mouth every 8 (eight) hours as needed for nausea or vomiting. Marland Kitchen  UNIFINE PENTIPS 32G X 4 MM MISC, USE 4 TIMES A DAY   Reviewed prior external information including notes and imaging from  primary  care provider As well as notes that were available from care everywhere and other healthcare systems.  Past medical history, social, surgical and family history all reviewed in electronic medical record.  [name] pertanent information unless stated regarding to the chief complaint.   Review of Systems:  [name]  visual changes, nausea, vomiting, diarrhea, constipation, dizziness, abdominal pain, skin rash, fevers, chills, night sweats, weight loss, swollen lymph nodes, chest pain, shortness of breath, mood changes. POSITIVE muscle aches, body aches, joint swelling, headache  Objective  Blood pressure 100/72, pulse (!) 109, height 5' (1.524 m), weight 140 lb (63.5 kg), SpO2 96 %.   General: [name] apparent distress alert and oriented x3 mood and affect normal, dressed appropriately.  HEENT: Pupils equal, extraocular movements intact  Respiratory: Patient's speak in full sentences and does not appear short of breath  Cardiovascular: [name] lower extremity edema, non tender, [name] erythema  Normal gait Patient is diffusely tender in multiple different muscle areas.  Patient does have full range of motion of the different joints but does have discomfort with movement.  Neck exam does have some voluntary guarding noted.  Patient is tender to palpation diffusely both bilaterally of the muscles.  Pain in the parascapular region as well.  Low back and tightness noted.  Mild weakness in core strength and hip abductor strength.  Negative straight leg test but does have tightness noted.  Knee exam bilaterally is does have diffuse pain.  Mild lateral tracking of the right patella noted today.  Patient does have mild crepitus bilaterally with range of motion [name] significant instability  Osteopathic findings C5 flexed rotated and side bent right C6 flexed rotated and side bent left T3 extended rotated and side bent right inhaled third rib T7 extended rotated and side bent left L1 flexed rotated and side bent right L4  flexed rotated and side bent right Sacrum right on right    Impression and Recommendations:     The above documentation has been reviewed and is accurate and complete Lyndal Pulley, DO

## 2020-06-19 NOTE — Patient Instructions (Addendum)
1.  Increase gabapentin to 600mg  twice daily as directed. If no improvement in 2 months, contact me. 2.  Continue Ajovy 3.  Take Ubrelvy 100mg  at earliest onset of headache.  May repeat in 2 hours if needed.  Max 2 tablets in 24 hours.  If effective, contact me for prescription 4.  Limit use of pain relievers to no more than 2 days out of week to prevent risk of rebound or medication-overuse headache. 5.  Keep headache diary 6.  Follow up in 6 months.

## 2020-06-19 NOTE — Assessment & Plan Note (Signed)
Seen rheumatology in the past.  On Cymbalta.

## 2020-07-11 ENCOUNTER — Encounter: Payer: Self-pay | Admitting: Neurology

## 2020-07-11 NOTE — Progress Notes (Signed)
Received fax approval valid from 07/10/20 through 01/06/21. Auth #: [id number].

## 2020-07-14 ENCOUNTER — Other Ambulatory Visit: Payer: Self-pay | Admitting: Internal Medicine

## 2020-07-30 NOTE — Progress Notes (Unsigned)
[place] Phone: [phone] Subjective:   Rachel [name], am serving as a scribe for Dr. Hulan [name]. This visit occurred during the SARS-CoV-2 public health emergency.  Safety protocols were in place, including screening questions prior to the visit, additional usage of staff PPE, and extensive cleaning of exam room while observing appropriate contact time as indicated for disinfecting solutions.   I'm seeing this patient by the request  of:  Rachel Borg, MD  CC: neck and back pain follow up knee pain follow up   [id number]  Rachel [name] is a 55 y.o. female coming in with complaint of back and neck pain. OMT 06/19/2020. Patient states that she is about the same as last visit. Uses flexeril prn. Continues to use gabapentin and cymbalta.   Medications patient has been prescribed: cymbalta, flexeril and gabapentin   Taking: Yes     MRI 2020 of right knee mild degenerative changes     Reviewed prior external information including notes and imaging from previsou exam, outside providers and external EMR if available.   As well as notes that were available from care everywhere and other healthcare systems.  Past medical history, social, surgical and family history all reviewed in electronic medical record.  [name] pertanent information unless stated regarding to the chief complaint.   Past Medical History:  Diagnosis Date  . ALLERGIC RHINITIS 10/04/2007  . Anemia 01/21/2011  . Anxiety 11/15/2018  . ASTHMA 08/02/2007   INHALERS ONLY IN [place]  . Asthma 08/02/2007   Qualifier: Diagnosis of  By: Wynona Luna   . Blood transfusion without reported diagnosis    with first child   . Cervical radiculopathy 01/21/2011  . Cervicogenic headache 04/27/2017  . COMMON MIGRAINE 05/15/2009  . Depression 01/09/2010   Qualifier: Diagnosis of  By: Jenny Reichmann MD, Hunt Oris   . DIABETES MELLITUS, TYPE II 08/02/2007  . Dysphagia,  pharyngoesophageal phase 06/12/2014  . GERD 08/02/2007  . HYPERLIPIDEMIA 08/02/2007  . Hyperlipidemia 08/02/2007   Qualifier: Diagnosis of  By: Wynona Luna   . INSOMNIA-SLEEP DISORDER-UNSPEC 01/04/2008  . INTERMITTENT VERTIGO 05/15/2009  . LIBIDO, DECREASED 01/09/2010  . Migraine without aura 05/15/2009   Qualifier: Diagnosis of  By: Jenny Reichmann MD, Hunt Oris   . Nonallopathic lesion of rib cage 12/21/2017  . Nonallopathic lesion of sacral region 07/29/2015  . Nonallopathic lesion of thoracic region 07/29/2015  . Osteoporosis 04/25/2019  . 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 Rheumatoid factor positive 02/10/2016  . Right knee pain 01/31/2019   Injected January 31, 2019  . Type 2 diabetes mellitus with hyperglycemia, with long-term current use of insulin ([place]) 08/02/2007   Qualifier: Diagnosis of  By: Wynona Luna     Allergies  Allergen Reactions  . Lipitor [Atorvastatin]     REACTION: myalgias  . Nitroglycerin      Review of Systems:  [name] headache, visual changes, nausea, vomiting, diarrhea, constipation, dizziness, abdominal pain, skin rash, fevers, chills, night sweats, weight loss, swollen lymph nodes,joint swelling, chest pain, shortness of breath, mood changes. POSITIVE muscle aches, body aches  Objective  Blood pressure 104/76, pulse (!) 110, height 5' (1.524 m), weight 143 lb (64.9 kg), SpO2 98 %.   General: [name] apparent distress alert and oriented x3 mood and affect normal, dressed appropriately.  HEENT: Pupils equal, extraocular movements intact  Respiratory: Patient's speak in full sentences and does not appear short  of breath  Cardiovascular: [name] lower extremity edema, non tender, [name] erythema  Gait normal with good balance and coordination.  MSK:   Back -patient does have diffuse tenderness noted from the cervical down to the lumbar spine.  Patient's pain does seem to be minorly out of proportion to the amount of palpation.  Patient does have tightness noted  throughout the paraspinal musculature of the back.  Patient does have though good range of motion of all the extremities.  Osteopathic findings  C2 flexed rotated and side bent right C6 flexed rotated and side bent left T3 extended rotated and side bent right inhaled rib T9 extended rotated and side bent left L2 flexed rotated and side bent right Sacrum right on right       Assessment and Plan: Myofascial pain syndrome, cervical Patient is having exacerbation.  Seems to have significant amount of pain everywhere.  Not only in the cervical but also in the mid and lower back today.  Patient does not feel like herself.  Patient is on the Cymbalta.  Do not want to change of medication significantly at this time discussed using the Flexeril on a more regular basis if she is able to tolerate it follow-up with me again 4 to 8 weeks.     Nonallopathic problems  Decision today to treat with OMT was based on Physical Exam  After verbal consent patient was treated with  ME, FPR techniques in cervical, rib, thoracic, lumbar, and sacral  areas  Patient tolerated the procedure well with improvement in symptoms  Patient given exercises, stretches and lifestyle modifications  See medications in patient instructions if given  Patient will follow up in 4-8 weeks      The above documentation has been reviewed and is accurate and complete Rachel Pulley, DO       Note: This dictation was prepared with Dragon dictation along with smaller phrase technology. Any transcriptional errors that result from this process are unintentional.

## 2020-07-31 ENCOUNTER — Encounter: Payer: Self-pay | Admitting: Family Medicine

## 2020-07-31 ENCOUNTER — Other Ambulatory Visit: Payer: Self-pay

## 2020-07-31 ENCOUNTER — Ambulatory Visit (INDEPENDENT_AMBULATORY_CARE_PROVIDER_SITE_OTHER): Payer: 59 | Admitting: Family Medicine

## 2020-07-31 VITALS — BP 104/76 | HR 110 | Ht 60.0 in | Wt 143.0 lb

## 2020-07-31 DIAGNOSIS — M7918 Myalgia, other site: Secondary | ICD-10-CM

## 2020-07-31 DIAGNOSIS — M999 Biomechanical lesion, unspecified: Secondary | ICD-10-CM

## 2020-07-31 NOTE — Patient Instructions (Signed)
Use flexeril once daily through weekend See me again in 4 weeks

## 2020-07-31 NOTE — Assessment & Plan Note (Signed)
Patient is having exacerbation.  Seems to have significant amount of pain everywhere.  Not only in the cervical but also in the mid and lower back today.  Patient does not feel like herself.  Patient is on the Cymbalta.  Do not want to change of medication significantly at this time discussed using the Flexeril on a more regular basis if she is able to tolerate it follow-up with me again 4 to 8 weeks.

## 2020-08-05 ENCOUNTER — Telehealth: Payer: Self-pay | Admitting: Neurology

## 2020-08-05 ENCOUNTER — Telehealth: Payer: Self-pay | Admitting: Internal Medicine

## 2020-08-05 ENCOUNTER — Other Ambulatory Visit: Payer: Self-pay | Admitting: Internal Medicine

## 2020-08-05 DIAGNOSIS — E1165 Type 2 diabetes mellitus with hyperglycemia: Secondary | ICD-10-CM

## 2020-08-05 DIAGNOSIS — Z794 Long term (current) use of insulin: Secondary | ICD-10-CM

## 2020-08-05 MED ORDER — INSULIN LISPRO (1 UNIT DIAL) 100 UNIT/ML (KWIKPEN): PEN_INJECTOR | SUBCUTANEOUS | 1 refills | Status: DC

## 2020-08-05 MED ORDER — LANTUS SOLOSTAR 100 UNIT/ML ~~LOC~~ SOPN: 32.0000 [IU] | PEN_INJECTOR | Freq: Every day | SUBCUTANEOUS | 1 refills | Status: DC

## 2020-08-05 NOTE — Telephone Encounter (Signed)
Rx sent to preferred pharmacy.

## 2020-08-05 NOTE — Telephone Encounter (Signed)
MEDICATION: Lantus and Humalog  PHARMACY:   [place], [place] - [address]. Phone:  [phone]  Fax:  [phone]     HAS THE PATIENT CONTACTED [place]?  yes  IS THIS A 90 DAY SUPPLY :   IS PATIENT OUT OF MEDICATION: yes  IF NOT; HOW MUCH IS LEFT: none  LAST APPOINTMENT DATE: @1 /09/2020  NEXT APPOINTMENT DATE:@4 /10/2020  DO WE HAVE YOUR PERMISSION TO LEAVE A DETAILED MESSAGE?: yes   **Let patient know to contact pharmacy at the end of the day to make sure medication is ready. **  ** Please notify patient to allow 48-72 hours to process**  **Encourage patient to contact the pharmacy for refills or they can request refills through [facility]**

## 2020-08-06 NOTE — Telephone Encounter (Signed)
Patient needs Gabapentin refill. She uses the [facility] out patient [facility]

## 2020-08-06 NOTE — Telephone Encounter (Signed)
Per office visit note, Pt to call in two months to let us know how she is doing.

## 2020-08-07 ENCOUNTER — Telehealth: Payer: Self-pay | Admitting: Internal Medicine

## 2020-08-07 ENCOUNTER — Other Ambulatory Visit: Payer: Self-pay | Admitting: Neurology

## 2020-08-07 DIAGNOSIS — E1165 Type 2 diabetes mellitus with hyperglycemia: Secondary | ICD-10-CM

## 2020-08-07 DIAGNOSIS — Z794 Long term (current) use of insulin: Secondary | ICD-10-CM

## 2020-08-07 MED ORDER — INSULIN ASPART 100 UNIT/ML ~~LOC~~ SOLN: 5.0000 [IU] | Freq: Three times a day (TID) | SUBCUTANEOUS | 1 refills | Status: DC

## 2020-08-07 NOTE — Telephone Encounter (Signed)
OK 

## 2020-08-07 NOTE — Telephone Encounter (Signed)
Ok to switch to [employer]?

## 2020-08-07 NOTE — Telephone Encounter (Signed)
Rx sent to preferred pharmacy.

## 2020-08-07 NOTE — Telephone Encounter (Signed)
Rachel [name] with [facility] called to request the Rx for Humalog be switched to Novolog and sent to [facility]. Also, Junie Panning states that they [facility]) have been trying to get the above medications switched for approx. 1 week but the phone# and fax# that are listed on the electronic RX is for the office of Dr. Jenny Reichmann located at [address] instead of Dr. Arman Filter contact information.

## 2020-08-13 ENCOUNTER — Telehealth: Payer: Self-pay | Admitting: Internal Medicine

## 2020-08-13 NOTE — Telephone Encounter (Signed)
1.Medication Requested: promethazine (PHENERGAN) 25 MG tablet    2. Pharmacy (Name, Street, [place]): [place], [place] - [address]  3. On Med List: yes   4. Last Visit with PCP: 1.3.22  5. Next visit date with PCP: 3.17.22   Patient is requesting a 30 day supply of the above medication.    Agent: Please be advised that RX refills may take up to 3 business days. We ask that you follow-up with your pharmacy.

## 2020-08-15 ENCOUNTER — Other Ambulatory Visit: Payer: Self-pay | Admitting: Internal Medicine

## 2020-08-15 MED ORDER — PROMETHAZINE HCL 25 MG PO TABS: ORAL_TABLET | ORAL | 5 refills | Status: DC

## 2020-08-28 ENCOUNTER — Encounter: Payer: Self-pay | Admitting: Internal Medicine

## 2020-08-28 ENCOUNTER — Other Ambulatory Visit: Payer: Self-pay

## 2020-08-28 ENCOUNTER — Ambulatory Visit (INDEPENDENT_AMBULATORY_CARE_PROVIDER_SITE_OTHER): Payer: 59 | Admitting: Family Medicine

## 2020-08-28 ENCOUNTER — Ambulatory Visit (INDEPENDENT_AMBULATORY_CARE_PROVIDER_SITE_OTHER): Payer: 59 | Admitting: Internal Medicine

## 2020-08-28 VITALS — BP 110/68 | HR 100 | Temp 98.6°F | Ht 60.0 in | Wt 144.0 lb

## 2020-08-28 VITALS — BP 110/76 | HR 101 | Ht 60.0 in

## 2020-08-28 DIAGNOSIS — Z0001 Encounter for general adult medical examination with abnormal findings: Secondary | ICD-10-CM | POA: Insufficient documentation

## 2020-08-28 DIAGNOSIS — E538 Deficiency of other specified B group vitamins: Secondary | ICD-10-CM | POA: Diagnosis not present

## 2020-08-28 DIAGNOSIS — E559 Vitamin D deficiency, unspecified: Secondary | ICD-10-CM

## 2020-08-28 DIAGNOSIS — M999 Biomechanical lesion, unspecified: Secondary | ICD-10-CM

## 2020-08-28 DIAGNOSIS — E1165 Type 2 diabetes mellitus with hyperglycemia: Secondary | ICD-10-CM | POA: Diagnosis not present

## 2020-08-28 DIAGNOSIS — G4486 Cervicogenic headache: Secondary | ICD-10-CM

## 2020-08-28 DIAGNOSIS — Z0000 Encounter for general adult medical examination without abnormal findings: Secondary | ICD-10-CM

## 2020-08-28 DIAGNOSIS — Z794 Long term (current) use of insulin: Secondary | ICD-10-CM | POA: Diagnosis not present

## 2020-08-28 LAB — CBC WITH DIFFERENTIAL/PLATELET
Basophils Absolute: 0.1 10*3/uL (ref 0.0–0.1)
Basophils Relative: 1.5 % (ref 0.0–3.0)
Eosinophils Absolute: 0.3 10*3/uL (ref 0.0–0.7)
Eosinophils Relative: 3.2 % (ref 0.0–5.0)
HCT: 35.8 % — ABNORMAL LOW (ref 36.0–46.0)
Hemoglobin: 11.9 g/dL — ABNORMAL LOW (ref 12.0–15.0)
Lymphocytes Relative: 48.7 % — ABNORMAL HIGH (ref 12.0–46.0)
Lymphs Abs: 3.9 10*3/uL (ref 0.7–4.0)
MCHC: 33.2 g/dL (ref 30.0–36.0)
MCV: 80.5 fl (ref 78.0–100.0)
Monocytes Absolute: 0.7 10*3/uL (ref 0.1–1.0)
Monocytes Relative: 8.8 % (ref 3.0–12.0)
Neutro Abs: 3 10*3/uL (ref 1.4–7.7)
Neutrophils Relative %: 37.8 % — ABNORMAL LOW (ref 43.0–77.0)
Platelets: 408 10*3/uL — ABNORMAL HIGH (ref 150.0–400.0)
RBC: 4.44 Mil/uL (ref 3.87–5.11)
RDW: 14 % (ref 11.5–15.5)
WBC: 8 10*3/uL (ref 4.0–10.5)

## 2020-08-28 LAB — TSH: TSH: 1.07 u[IU]/mL (ref 0.35–4.50)

## 2020-08-28 LAB — LIPID PANEL
Cholesterol: 229 mg/dL — ABNORMAL HIGH (ref 0–200)
HDL: 57.1 mg/dL (ref >39.00)
LDL Cholesterol: 151 mg/dL — ABNORMAL HIGH (ref 0–99)
NonHDL: 171.97
Total CHOL/HDL Ratio: 4
Triglycerides: 103 mg/dL (ref 0.0–149.0)
VLDL: 20.6 mg/dL (ref 0.0–40.0)

## 2020-08-28 LAB — URINALYSIS, ROUTINE W REFLEX MICROSCOPIC
Bilirubin Urine: NEGATIVE
Hgb urine dipstick: NEGATIVE
Ketones, ur: NEGATIVE
Nitrite: NEGATIVE
RBC / HPF: NONE SEEN (ref 0–?)
Specific Gravity, Urine: 1.01 (ref 1.000–1.030)
Total Protein, Urine: NEGATIVE
Urine Glucose: NEGATIVE
Urobilinogen, UA: 0.2 (ref 0.0–1.0)
pH: 6.5 (ref 5.0–8.0)

## 2020-08-28 LAB — BASIC METABOLIC PANEL
BUN: 13 mg/dL (ref 6–23)
CO2: 27 mEq/L (ref 19–32)
Calcium: 9.9 mg/dL (ref 8.4–10.5)
Chloride: 101 mEq/L (ref 96–112)
Creatinine, Ser: 0.7 mg/dL (ref 0.40–1.20)
GFR: 96.78 mL/min (ref >60.00)
Glucose, Bld: 88 mg/dL (ref 70–99)
Potassium: 4 mEq/L (ref 3.5–5.1)
Sodium: 137 mEq/L (ref 135–145)

## 2020-08-28 LAB — MICROALBUMIN / CREATININE URINE RATIO
Creatinine,U: 88.9 mg/dL
Microalb Creat Ratio: 0.8 mg/g (ref 0.0–30.0)
Microalb, Ur: <0.7 mg/dL (ref 0.0–1.9)

## 2020-08-28 LAB — HEPATIC FUNCTION PANEL
ALT: 12 U/L (ref 0–35)
AST: 12 U/L (ref 0–37)
Albumin: 4.2 g/dL (ref 3.5–5.2)
Alkaline Phosphatase: 60 U/L (ref 39–117)
Bilirubin, Direct: 0 mg/dL (ref 0.0–0.3)
Total Bilirubin: 0.4 mg/dL (ref 0.2–1.2)
Total Protein: 7.4 g/dL (ref 6.0–8.3)

## 2020-08-28 LAB — VITAMIN B12: Vitamin B-12: 363 pg/mL (ref 211–911)

## 2020-08-28 LAB — VITAMIN D 25 HYDROXY (VIT D DEFICIENCY, FRACTURES): VITD: 45.94 ng/mL (ref 30.00–100.00)

## 2020-08-28 MED ORDER — CYCLOBENZAPRINE HCL 5 MG PO TABS: 5.0000 mg | ORAL_TABLET | Freq: Three times a day (TID) | ORAL | 1 refills | Status: DC | PRN

## 2020-08-28 MED ORDER — PROMETHAZINE HCL 25 MG PO TABS: ORAL_TABLET | ORAL | 5 refills | Status: DC

## 2020-08-28 NOTE — Assessment & Plan Note (Signed)
Patient continues to have cervicogenic headaches.  Does have known cervical stenosis of the spine.  Patient also has significant body aches that is likely secondary to more of a fibromyalgia chronic pain syndrome.  Patient is on multiple different medications for this.  We did discuss potentially trying to increase her Flexeril to 2 pills at night.  See how patient responds.  Patient has done this before on her own accord.  Refill given today.  Discussed posture and ergonomics.  Patient does seem to respond fairly well to osteopathic manipulation and we can continue this.  We will see patient again in 4 to 5 weeks.

## 2020-08-28 NOTE — Progress Notes (Signed)
Patient ID: Rachel [name], female   DOB: 12-10-1965, 55 y.o.   MRN: [id number]         Chief Complaint:: wellness exam       HPI:  Rachel [name] is a 55 y.o. female here for wellness exam, declines gyn appt for now and plans to call herself, also states previously had difficlty with prep for colonoscopy before inolving low sugar and did not get the procedure, but plans to call insurance to see if insurnace covers cologuard.  O/w up to date with preventive referrals and immunizations                        Also applying for disability, currentl living on savings as unemployment checks not yet coming.  Pt denies polydipsia, polyuria, or low sugar symptoms such as weakness or confusion improved with po intake.  Pt states overall good compliance with meds, trying to follow lower cholesterol, diabetic diet, wt overall stable but little exercise however.    Pt denies fever, wt loss, night sweats, loss of appetite, or other constitutional symptoms   Wt Readings from Last 3 Encounters:  08/28/20 144 lb (65.3 kg)  07/31/20 143 lb (64.9 kg)  06/19/20 140 lb (63.5 kg)   BP Readings from Last 3 Encounters:  08/28/20 110/76  08/28/20 110/68  07/31/20 104/76   Immunization History  Administered Date(s) Administered  . H1N1 04/15/2008  . Influenza Whole 04/22/2008, 03/14/2010  . Influenza,inj,Quad PF,6+ Mos 03/13/2014, 02/28/2019, 02/26/2020  . Influenza-Unspecified 03/09/2017, 02/14/2018  . Moderna Sars-Covid-2 Vaccination 04/25/2020, 06/03/2020  . Pneumococcal Conjugate-13 07/09/2015  . Pneumococcal Polysaccharide-23 07/08/2008, 06/25/2016  . Td 10/03/2006  . Tdap 06/25/2016   Health Maintenance Due  Topic Date Due  . COLONOSCOPY (Pts 45-87yrs Insurance coverage will need to be confirmed)  Never done      Past Medical History:  Diagnosis Date  . ALLERGIC RHINITIS 10/04/2007  . Anemia 01/21/2011  . Anxiety 11/15/2018  . ASTHMA 08/02/2007   INHALERS ONLY IN [place]  . Asthma 08/02/2007    Qualifier: Diagnosis of  By: Wynona Luna   . Blood transfusion without reported diagnosis    with first child   . Cervical radiculopathy 01/21/2011  . Cervicogenic headache 04/27/2017  . COMMON MIGRAINE 05/15/2009  . Depression 01/09/2010   Qualifier: Diagnosis of  By: Jenny Reichmann MD, Hunt Oris   . DIABETES MELLITUS, TYPE II 08/02/2007  . Dysphagia, pharyngoesophageal phase 06/12/2014  . GERD 08/02/2007  . HYPERLIPIDEMIA 08/02/2007  . Hyperlipidemia 08/02/2007   Qualifier: Diagnosis of  By: Wynona Luna   . INSOMNIA-SLEEP DISORDER-UNSPEC 01/04/2008  . INTERMITTENT VERTIGO 05/15/2009  . LIBIDO, DECREASED 01/09/2010  . Migraine without aura 05/15/2009   Qualifier: Diagnosis of  By: Jenny Reichmann MD, Hunt Oris   . Nonallopathic lesion of rib cage 12/21/2017  . Nonallopathic lesion of sacral region 07/29/2015  . Nonallopathic lesion of thoracic region 07/29/2015  . Osteoporosis 04/25/2019  . 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 Rheumatoid factor positive 02/10/2016  . Right knee pain 01/31/2019   Injected January 31, 2019  . Type 2 diabetes mellitus with hyperglycemia, with long-term current use of insulin ([place]) 08/02/2007   Qualifier: Diagnosis of  By: Wynona Luna    Past Surgical History:  Procedure Laterality Date  . CESAREAN SECTION     x 3  . CYST EXCISION     left knee  . PAROTIDECTOMY  10/21/2011  Procedure: PAROTIDECTOMY;  Surgeon: Melida Quitter, MD;  Location: [facility];  Service: ENT;  Laterality: Left;    reports that she has never smoked. She has never used smokeless tobacco. She reports that she does not drink alcohol and does not use drugs. family history includes Asthma in her father; Cervical cancer in her maternal aunt; Diabetes in her father; Heart disease in her maternal aunt; Hypertension in her father. Allergies  Allergen Reactions  . Lipitor [Atorvastatin]     REACTION: myalgias  . Nitroglycerin    Current Outpatient Medications on File Prior to Visit  Medication  Sig Dispense Refill  . albuterol (VENTOLIN HFA) 108 (90 Base) MCG/ACT inhaler Inhale 2 puffs into the lungs every 6 (six) hours as needed for wheezing or shortness of breath. 18 g 5  . aspirin 81 MG EC tablet Take 81 mg by mouth daily.    Marland Kitchen atenolol (TENORMIN) 25 MG tablet Take 1 tablet (25 mg total) by mouth daily. X 7 days, then increase to 2 tabs (50mg ) daily 60 tablet 3  . butalbital-acetaminophen-caffeine (FIORICET) 50-325-40 MG tablet TAKE 1 TABLET BY MOUTH EVERY 6 HOURS AS NEEDED FOR HEADACHE. MAX 1 OR 2 TABLETS PER DAY. 30 tablet 3  . Cholecalciferol (VITAMIN D) 50 MCG (2000 UT) CAPS Take 4,000 Units by mouth daily.    . Diclofenac Sodium (PENNSAID) 2 % SOLN Place 2 g onto the skin 2 (two) times daily. 112 g 3  . DULoxetine (CYMBALTA) 60 MG capsule Take 60 mg by mouth daily.    Marland Kitchen esomeprazole (NEXIUM) 40 MG capsule TAKE 1 CAPSULE BY MOUTH 2 TIMES DAILY BEFORE A MEAL. 180 capsule 3  . fexofenadine (ALLEGRA) 180 MG tablet Take 1 tablet (180 mg total) by mouth daily. 30 tablet 2  . Fremanezumab-vfrm (AJOVY) 225 MG/1.5ML SOAJ Inject 225 mg into the skin every 30 (thirty) days. 1 pen 11  . gabapentin (NEURONTIN) 300 MG capsule 2 capsules twice daily. Call office to get further refills 120 capsule 0  . HYDROcodone-homatropine (HYCODAN) 5-1.5 MG/5ML syrup Take 5 mLs by mouth every 6 (six) hours as needed for cough. 180 mL 0  . insulin aspart (NOVOLOG) 100 UNIT/ML injection Inject 5-8 Units into the skin 3 (three) times daily before meals. 24 mL 1  . insulin glargine (LANTUS SOLOSTAR) 100 UNIT/ML Solostar Pen Inject 32 Units into the skin daily. 30 mL 1  . Lasmiditan Succinate (REYVOW) 100 MG TABS Take 100 mg by mouth as needed. Take 1 tablet for headache. No more than 1 tablet in 24 hours 8 tablet 3  . LINZESS 145 MCG CAPS capsule TAKE 1 CAPSULE (145 MCG TOTAL) BY MOUTH DAILY BEFORE BREAKFAST. 30 capsule 2  . meclizine (ANTIVERT) 12.5 MG tablet Take 1 tablet (12.5 mg total) by mouth 3 (three) times  daily as needed for dizziness. 40 tablet 1  . meloxicam (MOBIC) 15 MG tablet Take 1 tablet (15 mg total) by mouth daily as needed for pain. 30 tablet 1  . metoCLOPramide (REGLAN) 5 MG tablet Take 1 tablet (5 mg total) by mouth every 8 (eight) hours as needed for nausea or vomiting. 40 tablet 1  . Multiple Vitamin (MULTIVITAMIN WITH MINERALS) TABS tablet Take 1 tablet by mouth daily.    . naproxen (NAPROSYN) 500 MG tablet Take 1 tablet (500 mg total) by mouth 2 (two) times daily. 30 tablet 0  . nystatin-triamcinolone ointment (MYCOLOG) Apply 1 application topically 2 (two) times daily. 30 g 2  . ondansetron (ZOFRAN ODT)  8 MG disintegrating tablet Take 1 tablet (8 mg total) by mouth every 8 (eight) hours as needed for nausea or vomiting. 30 tablet 0  . ondansetron (ZOFRAN) 4 MG tablet TAKE 1 TABLET BY MOUTH EVERY 8 HOURS AS NEEDED FOR NAUSEA OR VOMITING. 40 tablet 1  . promethazine-dextromethorphan (PROMETHAZINE-DM) 6.25-15 MG/5ML syrup Take 5 mLs by mouth 4 (four) times daily as needed for cough. 118 mL 0  . rosuvastatin (CRESTOR) 40 MG tablet TAKE 1 TABLET (40 MG TOTAL) BY MOUTH DAILY. 90 tablet 3  . Semaglutide,0.25 or 0.5MG /DOS, (OZEMPIC, 0.25 OR 0.5 MG/DOSE,) 2 MG/1.5ML SOPN Inject 0.5 mg into the skin once a week. 4.5 mL 3  . traMADol (ULTRAM) 50 MG tablet TAKE 1 TABLET BY MOUTH EVERY 8 HOURS AS NEEDED 60 tablet 1  . triamcinolone (NASACORT) 55 MCG/ACT AERO nasal inhaler Place 2 sprays into the nose daily. 1 Inhaler 12  . UNIFINE PENTIPS 32G X 4 MM MISC USE 4 TIMES A DAY 200 each PRN  . amLODipine (NORVASC) 10 MG tablet     . Blood Glucose Monitoring Suppl (FREESTYLE LITE) DEVI Use as directed four times daily E11.9 (Patient not taking: Reported on 08/28/2020) 1 each 0  . doxycycline (VIBRA-TABS) 100 MG tablet Take 1 tablet (100 mg total) by mouth 2 (two) times daily. (Patient not taking: Reported on 08/28/2020) 20 tablet 0  . glucose blood (FREESTYLE LITE) test strip USE AS INSTRUCTED FOUR TIMES  DAILY E11.9 (Patient not taking: Reported on 08/28/2020) 400 strip 12  . Insulin Regular Human (NOVOLIN R FLEXPEN) 100 UNIT/ML SOPN inject 36 units daily    . Lancets (FREESTYLE) lancets USE AS INSTRUCTED FOUR TIMES DAILY E11.9 (Patient not taking: Reported on 08/28/2020) 400 each 12  . pantoprazole (PROTONIX) 40 MG tablet Take by mouth.     No current facility-administered medications on file prior to visit.        ROS:  All others reviewed and negative.  Objective        PE:  BP 110/68   Pulse 100   Temp 98.6 F (37 C) (Oral)   Ht 5' (1.524 m)   Wt 144 lb (65.3 kg)   SpO2 98%   BMI 28.12 kg/m                 Constitutional: Pt appears in NAD               HENT: Head: NCAT.                Right Ear: External ear normal.                 Left Ear: External ear normal.                Eyes: . Pupils are equal, round, and reactive to light. Conjunctivae and EOM are normal               Nose: without d/c or deformity               Neck: Neck supple. Gross normal ROM               Cardiovascular: Normal rate and regular rhythm.                 Pulmonary/Chest: Effort normal and breath sounds without rales or wheezing.                Abd:  Soft, NT, ND, + BS, no organomegaly  Neurological: Pt is alert. At baseline orientation, motor grossly intact               Skin: Skin is warm. No rashes, no other new lesions, LE edema - none               Psychiatric: Pt behavior is normal without agitation   Micro: none  Cardiac tracings I have personally interpreted today:  none  Pertinent Radiological findings (summarize): none   Lab Results  Component Value Date   WBC 8.0 08/28/2020   HGB 11.9 (L) 08/28/2020   HCT 35.8 (L) 08/28/2020   PLT 408.0 (H) 08/28/2020   GLUCOSE 88 08/28/2020   CHOL 229 (H) 08/28/2020   TRIG 103.0 08/28/2020   HDL 57.10 08/28/2020   LDLDIRECT 191.0 08/23/2018   LDLCALC 151 (H) 08/28/2020   ALT 12 08/28/2020   AST 12 08/28/2020   NA 137  08/28/2020   K 4.0 08/28/2020   CL 101 08/28/2020   CREATININE 0.70 08/28/2020   BUN 13 08/28/2020   CO2 27 08/28/2020   TSH 1.07 08/28/2020   INR 1.08 07/23/2009   HGBA1C 7.8 (A) 06/17/2020   MICROALBUR <0.7 08/28/2020   Assessment/Plan:  Rachel [name] is a 55 y.o. Other or two or more races [6] female with  has a past medical history of ALLERGIC RHINITIS (10/04/2007), Anemia (01/21/2011), Anxiety (11/15/2018), ASTHMA (08/02/2007), Asthma (08/02/2007), Blood transfusion without reported diagnosis, Cervical radiculopathy (01/21/2011), Cervicogenic headache (04/27/2017), COMMON MIGRAINE (05/15/2009), Depression (01/09/2010), DIABETES MELLITUS, TYPE II (08/02/2007), Dysphagia, pharyngoesophageal phase (06/12/2014), GERD (08/02/2007), HYPERLIPIDEMIA (08/02/2007), Hyperlipidemia (08/02/2007), INSOMNIA-SLEEP DISORDER-UNSPEC (01/04/2008), INTERMITTENT VERTIGO (05/15/2009), LIBIDO, DECREASED (01/09/2010), Migraine without aura (05/15/2009), Nonallopathic lesion of rib cage (12/21/2017), Nonallopathic lesion of sacral region (07/29/2015), Nonallopathic lesion of thoracic region (07/29/2015), Osteoporosis (04/25/2019), Patellofemoral syndrome of both knees (04/25/2019), Rheumatoid factor positive (02/10/2016), Right knee pain (01/31/2019), and Type 2 diabetes mellitus with hyperglycemia, with long-term current use of insulin ([place]) (08/02/2007).  Preventative health care Age and sex appropriate education and counseling updated with regular exercise and diet Referrals for preventative services - pt now accepts GYN referral - will do Immunizations addressed - none needed Smoking counseling  - none needed Evidence for depression or other mood disorder - none significant Most recent labs reviewed. I have personally reviewed and have noted: 1) the patient's medical and social history 2) The patient's current medications and supplements 3) The patient's height, weight, and BMI have been recorded in the chart   Type 2 diabetes  mellitus with hyperglycemia, with long-term current use of insulin ([facility]) Lab Results  Component Value Date   HGBA1C 7.8 (A) 06/17/2020   Stable, pt to continue current medical treatment - f/u endo   Followup: Return in about 6 months (around 02/28/2021).  Cathlean Cower, MD 08/31/2020 10:08 PM [facility]

## 2020-08-28 NOTE — Patient Instructions (Addendum)
Please continue all other medications as before, and refills have been done if requested.  Please have the pharmacy call with any other refills you may need.  Please continue your efforts at being more active, low cholesterol diet, and weight control.  You are otherwise up to date with prevention measures today.  You will be contacted regarding the referral for: GYN  Please keep your appointments with your specialists as you may have planned  Please go to the LAB at the blood drawing area for the tests to be done  You will be contacted by phone if any changes need to be made immediately.  Otherwise, you will receive a letter about your results with an explanation, but please check with MyChart first.  Please remember to sign up for MyChart if you have not done so, as this will be important to you in the future with finding out test results, communicating by private email, and scheduling acute appointments online when needed.  Please make an Appointment to return in 6 months, or sooner if needed

## 2020-08-28 NOTE — Progress Notes (Signed)
[place] Phone: [phone] Subjective:   Rachel [name], am serving as a scribe for Dr. Hulan Saas. This visit occurred during the SARS-CoV-2 public health emergency.  Safety protocols were in place, including screening questions prior to the visit, additional usage of staff PPE, and extensive cleaning of exam room while observing appropriate contact time as indicated for disinfecting solutions.   I'm seeing this patient by the request  of:  Biagio Borg, MD  CC: Back and neck pain follow-up  [id number]  Rachel [name] is a 55 y.o. female coming in with complaint of back and neck pain. OMT 07/31/2020. Patient states that her neck is tight. Patient feels the same as last visit.  Patient states he is has pain every day.  Starting to have some mild increase in knee pain again.  Nothing [name] severe.  Everything is hurts.  Medications patient has been prescribed: None Taking: Patient has been taking Flexeril that was prescribed initially by primary care provider.  Did take 10 mg at night that did help her sleep but otherwise only gets 2 hours of sleep at night.         Reviewed prior external information including notes and imaging from previsou exam, outside providers and external EMR if available.   As well as notes that were available from care everywhere and other healthcare systems.  Past medical history, social, surgical and family history all reviewed in electronic medical record.  [name] pertanent information unless stated regarding to the chief complaint.   Past Medical History:  Diagnosis Date  . ALLERGIC RHINITIS 10/04/2007  . Anemia 01/21/2011  . Anxiety 11/15/2018  . ASTHMA 08/02/2007   INHALERS ONLY IN [place]  . Asthma 08/02/2007   Qualifier: Diagnosis of  By: Wynona Luna   . Blood transfusion without reported diagnosis    with first child   . Cervical radiculopathy 01/21/2011  .  Cervicogenic headache 04/27/2017  . COMMON MIGRAINE 05/15/2009  . Depression 01/09/2010   Qualifier: Diagnosis of  By: Jenny Reichmann MD, Hunt Oris   . DIABETES MELLITUS, TYPE II 08/02/2007  . Dysphagia, pharyngoesophageal phase 06/12/2014  . GERD 08/02/2007  . HYPERLIPIDEMIA 08/02/2007  . Hyperlipidemia 08/02/2007   Qualifier: Diagnosis of  By: Wynona Luna   . INSOMNIA-SLEEP DISORDER-UNSPEC 01/04/2008  . INTERMITTENT VERTIGO 05/15/2009  . LIBIDO, DECREASED 01/09/2010  . Migraine without aura 05/15/2009   Qualifier: Diagnosis of  By: Jenny Reichmann MD, Hunt Oris   . Nonallopathic lesion of rib cage 12/21/2017  . Nonallopathic lesion of sacral region 07/29/2015  . Nonallopathic lesion of thoracic region 07/29/2015  . Osteoporosis 04/25/2019  . 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 Rheumatoid factor positive 02/10/2016  . Right knee pain 01/31/2019   Injected January 31, 2019  . Type 2 diabetes mellitus with hyperglycemia, with long-term current use of insulin ([place]) 08/02/2007   Qualifier: Diagnosis of  By: Wynona Luna     Allergies  Allergen Reactions  . Lipitor [Atorvastatin]     REACTION: myalgias  . Nitroglycerin      Review of Systems:  [name] headache, visual changes, nausea, vomiting, diarrhea, constipation, dizziness, abdominal pain, skin rash, fevers, chills, night sweats, weight loss, swollen lymph nodes,joint swelling, chest pain, shortness of breath, mood changes. POSITIVE muscle aches, body aches  Objective  Blood pressure 110/76, pulse (!) 101, height 5' (1.524 m), SpO2 98 %.   General: [name]  apparent distress alert and oriented x3 mood and affect normal, dressed appropriately.  HEENT: Pupils equal, extraocular movements intact  Respiratory: Patient's speak in full sentences and does not appear short of breath  Cardiovascular: [name] lower extremity edema, non tender, [name] erythema  Gait normal with good balance and coordination.  MSK: Diffuse tenderness of multiple joints.  Patient's  knees do have some mild crepitus.  [name] significant instability though noted. Back -back exam does have loss of lordosis.  Still poor core strength.  Patient does have some mild improvement though in strength of the upper back.  Patient is diffusely tender in multiple points of the soft tissue.  Osteopathic findings  C2 flexed rotated and side bent right C5 flexed rotated and side bent left T3 extended rotated and side bent right inhaled rib T8 extended rotated and side bent left L2 flexed rotated and side bent right L4 flexed rotated and side bent left Sacrum right on right       Assessment and Plan:  Cervicogenic headache Patient continues to have cervicogenic headaches.  Does have known cervical stenosis of the spine.  Patient also has significant body aches that is likely secondary to more of a fibromyalgia chronic pain syndrome.  Patient is on multiple different medications for this.  We did discuss potentially trying to increase her Flexeril to 2 pills at night.  See how patient responds.  Patient has done this before on her own accord.  Refill given today.  Discussed posture and ergonomics.  Patient does seem to respond fairly well to osteopathic manipulation and we can continue this.  We will see patient again in 4 to 5 weeks.    Nonallopathic problems  Decision today to treat with OMT was based on Physical Exam  After verbal consent patient was treated with HVLA, ME, FPR techniques in cervical, rib, thoracic, lumbar, and sacral  areas  Patient tolerated the procedure well with improvement in symptoms  Patient given exercises, stretches and lifestyle modifications  See medications in patient instructions if given  Patient will follow up in 4-8 weeks      The above documentation has been reviewed and is accurate and complete Lyndal Pulley, DO       Note: This dictation was prepared with Dragon dictation along with smaller phrase technology. Any transcriptional  errors that result from this process are unintentional.

## 2020-08-28 NOTE — Patient Instructions (Addendum)
Take up to 2 at night of flexeril Keep being active See me again in 4-5 weeks

## 2020-08-29 ENCOUNTER — Encounter: Payer: Self-pay | Admitting: Family Medicine

## 2020-08-31 ENCOUNTER — Encounter: Payer: Self-pay | Admitting: Internal Medicine

## 2020-08-31 NOTE — Assessment & Plan Note (Addendum)
Age and sex appropriate education and counseling updated with regular exercise and diet Referrals for preventative services - pt now accepts GYN referral - will do Immunizations addressed - none needed Smoking counseling  - none needed Evidence for depression or other mood disorder - none significant Most recent labs reviewed. I have personally reviewed and have noted: 1) the patient's medical and social history 2) The patient's current medications and supplements 3) The patient's height, weight, and BMI have been recorded in the chart

## 2020-08-31 NOTE — Assessment & Plan Note (Signed)
Lab Results  Component Value Date   HGBA1C 7.8 (A) 06/17/2020   Stable, pt to continue current medical treatment - f/u endo

## 2020-09-16 ENCOUNTER — Other Ambulatory Visit: Payer: Self-pay

## 2020-09-16 ENCOUNTER — Ambulatory Visit (INDEPENDENT_AMBULATORY_CARE_PROVIDER_SITE_OTHER): Payer: 59 | Admitting: Internal Medicine

## 2020-09-16 ENCOUNTER — Other Ambulatory Visit (HOSPITAL_COMMUNITY): Payer: Self-pay

## 2020-09-16 ENCOUNTER — Encounter: Payer: Self-pay | Admitting: Internal Medicine

## 2020-09-16 VITALS — BP 118/78 | HR 106 | Ht 60.0 in | Wt 143.4 lb

## 2020-09-16 DIAGNOSIS — E663 Overweight: Secondary | ICD-10-CM

## 2020-09-16 DIAGNOSIS — Z794 Long term (current) use of insulin: Secondary | ICD-10-CM

## 2020-09-16 DIAGNOSIS — E1165 Type 2 diabetes mellitus with hyperglycemia: Secondary | ICD-10-CM

## 2020-09-16 DIAGNOSIS — E782 Mixed hyperlipidemia: Secondary | ICD-10-CM

## 2020-09-16 LAB — POCT GLYCOSYLATED HEMOGLOBIN (HGB A1C): Hemoglobin A1C: 6.6 % — AB (ref 4.0–5.6)

## 2020-09-16 MED ORDER — ACCU-CHEK GUIDE VI STRP
ORAL_STRIP | 12 refills | Status: DC
  Filled 2020-09-16: qty 150, 75d supply, fill #0
  Filled 2021-07-27: qty 150, 75d supply, fill #1

## 2020-09-16 MED ORDER — INSULIN GLARGINE 100 UNIT/ML SOLOSTAR PEN
PEN_INJECTOR | SUBCUTANEOUS | 1 refills | Status: DC
Start: 2020-09-16 — End: 2021-02-05

## 2020-09-16 NOTE — Progress Notes (Signed)
Subjective:     Patient ID: Rachel [name], female   DOB: [birth year], 55 y.o.   MRN: [id number]  This visit occurred during the SARS-CoV-2 public health emergency.  Safety protocols were in place, including screening questions prior to the visit, additional usage of staff PPE, and extensive cleaning of exam room while observing appropriate contact time as indicated for disinfecting solutions.   HPI Ms. Rachel [name] is a  55 y.o. woman, returning for f/u for DM2, dx 1989, uncontrolled, insulin-dependent, with probable complications (? peripheral neuropathy).  Last visit 3 months ago.  Interim history: Since last visit, she did not develop any increased urination, blurry vision, significant weight changes.  She has no complaints at this visit Her sugars improved further.  Reviewed HbA1c levels: Lab Results  Component Value Date   HGBA1C 7.8 (A) 06/17/2020   HGBA1C 10.0 (H) 08/29/2019   HGBA1C 11.1 (A) 07/02/2019  02/17/2017: HbA1c calculated from fructosamine is better, at 8.34%, but still high. Prev. 10.1%.  Reviewed history: She came off all DM medicines for 6 mo before the HbA1c in 09/2013. She again ran out of all meds in 04/2016.  She is  on: - Lantus 40 >> 34 >> 36 >> 32 units in a.m. - Humalog 3-7 >> 8-12 >> 3-7 >> 5-8 >> NovoLog 6-7 units before meals  -takes these only when sugars are higher before meals - Ozempic 0.5 mg weekly - added 06/2019 >> 0.25 >> 0.5 mg weekly - no nausea Stopped Januvia 100 mg in am >> then Ozempic 0.5 mg weekly She is off Metformin XR 1000 mg 2x a day with b'fast and dinner >> upset stomach >> stopped 85/2778 We triedTrulicity 1.5 mg weekly >> worked great but had to stop b/c GERD >> stopped. We tried Jardiance 25 mg daily >> started 08/2016 >> but stopped recently 2/2 recurrent UTIs She was on Glipizide 5 mg bid - added 04/2015 >> was not taking it b/c low CBGs. We stopped Glipizide XL 5 mg in 07/2014. She had episodes of hypoglycemia with Amaryl in the  past. She was on Bydureon 2 mg weekly (had nausea).  She checks her sugars 1-2 times a day:,  - am: 180-300 >> 138-140 >> 100-130 >> 66, 74, 84-141, 145 (Chinese) - after b'fast: 158-251 >> n/c - before lunch: 180-200 >> n/c >> 60 x1 >> n/c - after lunch: up to 200s >> n/c - before dinner:  180-200 >> 137, 150 >> 140-150  - after dinner: 166-245 >> n/c - bedtime: n/c >> 300 >> 100-125 (has snack) >> n/c She has hypoglycemia awareness in the 90s. Lowest: 38 (while on Trulicity) >> 24M >> 353 >> 60 x1 - skipped lunch >> 49 (05/2020) at night - lost consciousness x2. Highest 600 >> ... >> 450 >> 150 >> HI.  She saw nutrition in the past  -+ HL. Last lipids: Lab Results  Component Value Date   CHOL 229 (H) 08/28/2020   HDL 57.10 08/28/2020   LDLCALC 151 (H) 08/28/2020   LDLDIRECT 191.0 08/23/2018   TRIG 103.0 08/28/2020   CHOLHDL 4 08/28/2020  On Crestor 40 (ran out Crestor) >> restarted.  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  Review of Systems Constitutional: no weight gain/no weight loss, no fatigue, no subjective hyperthermia, no subjective hypothermia Eyes: no blurry vision, no xerophthalmia ENT: no sore throat, no nodules palpated in neck, no dysphagia, no odynophagia, no hoarseness Cardiovascular: no CP/no SOB/no palpitations/no leg swelling Respiratory: no cough/no SOB/no wheezing Gastrointestinal: no N/no V/no D/no C/no acid reflux Musculoskeletal: no muscle aches/no joint aches Skin: no rashes, no hair loss Neurological: no tremors/no numbness/no tingling/no dizziness  I reviewed pt's medications, allergies, PMH, social hx, family hx, and changes were documented in the history of present illness. Otherwise, unchanged from my initial  visit note.  Past Medical History:  Diagnosis Date  . ALLERGIC RHINITIS 10/04/2007  . Anemia 01/21/2011  . Anxiety 11/15/2018  . ASTHMA 08/02/2007   INHALERS ONLY IN [place]  . Asthma 08/02/2007   Qualifier: Diagnosis of  By: Wynona Luna   . Blood transfusion without reported diagnosis    with first child   . Cervical radiculopathy 01/21/2011  . Cervicogenic headache 04/27/2017  . COMMON MIGRAINE 05/15/2009  . Depression 01/09/2010   Qualifier: Diagnosis of  By: Jenny Reichmann MD, Hunt Oris   . DIABETES MELLITUS, TYPE II 08/02/2007  . Dysphagia, pharyngoesophageal phase 06/12/2014  . GERD 08/02/2007  . HYPERLIPIDEMIA 08/02/2007  . Hyperlipidemia 08/02/2007   Qualifier: Diagnosis of  By: Wynona Luna   . INSOMNIA-SLEEP DISORDER-UNSPEC 01/04/2008  . INTERMITTENT VERTIGO 05/15/2009  . LIBIDO, DECREASED 01/09/2010  . Migraine without aura 05/15/2009   Qualifier: Diagnosis of  By: Jenny Reichmann MD, Hunt Oris   . Nonallopathic lesion of rib cage 12/21/2017  . Nonallopathic lesion of sacral region 07/29/2015  . Nonallopathic lesion of thoracic region 07/29/2015  . Osteoporosis 04/25/2019  . 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 Rheumatoid factor positive 02/10/2016  . Right knee pain 01/31/2019   Injected January 31, 2019  . Type 2 diabetes mellitus with hyperglycemia, with long-term current use of insulin ([place]) 08/02/2007   Qualifier: Diagnosis of  By: Wynona Luna    Past Surgical History:  Procedure Laterality Date  . CESAREAN SECTION     x 3  . CYST EXCISION     left knee  . PAROTIDECTOMY  10/21/2011   Procedure: PAROTIDECTOMY;  Surgeon: Melida Quitter, MD;  Location: [facility] OR;  Service: ENT;  Laterality: Left;   Social History   Socioeconomic History  . Marital status: Married    Spouse name: Rachel [name]  . Number of children: 3  . Years of education: Degree  . Highest education level: Not on file  Occupational History  . Occupation: Research scientist (life sciences): [employer]  .  Occupation: Chartered certified accountant    Employer: [employer]  Tobacco Use  . Smoking status: Never Smoker  . Smokeless tobacco: Never Used  Vaping Use  . Vaping Use: Never used  Substance and Sexual Activity  . Alcohol use: No    Alcohol/week: 0.0 standard drinks  . Drug use: No  . Sexual activity: Not on file  Other Topics Concern  . Not on file  Social History Narrative   She has 3 daughters.   Patient has a 4 year degree.    Patient working at [employer].    Patient is married to [place].   Social Determinants of Health   Financial Resource Strain: Not on file  Food Insecurity: Not on file  Transportation Needs: Not on file  Physical Activity: Not on file  Stress: Not on  file  Social Connections: Not on file  Intimate Partner Violence: Not on file   Current Outpatient Medications on File Prior to Visit  Medication Sig Dispense Refill  . albuterol (VENTOLIN HFA) 108 (90 Base) MCG/ACT inhaler INHALE 2 PUFFS INTO THE LUNGS EVERY 6 (SIX) HOURS AS NEEDED FOR WHEEZING OR SHORTNESS OF BREATH. 18 g 5  . amLODipine (NORVASC) 10 MG tablet     . aspirin 81 MG EC tablet Take 81 mg by mouth daily.    Marland Kitchen atenolol (TENORMIN) 25 MG tablet Take 1 tablet (25 mg total) by mouth daily. X 7 days, then increase to 2 tabs ($Remov'50mg'lHvnUu$ ) daily 60 tablet 3  . Blood Glucose Monitoring Suppl (FREESTYLE LITE) DEVI Use as directed four times daily E11.9 (Patient not taking: Reported on 08/28/2020) 1 each 0  . Blood Glucose Monitoring Suppl (FREESTYLE LITE) w/Device KIT USE AS DIRECTED FOUR TIMES DAILY E11.9 1 kit 0  . butalbital-acetaminophen-caffeine (FIORICET) 50-325-40 MG tablet TAKE 1 TABLET BY MOUTH EVERY 6 HOURS AS NEEDED FOR HEADACHE. MAX 1 OR 2 TABLETS PER DAY. 30 tablet 3  . Cholecalciferol (VITAMIN D) 50 MCG (2000 UT) CAPS Take 4,000 Units by mouth daily.    . cyclobenzaprine (FLEXERIL) 5 MG tablet TAKE 1 TABLET BY MOUTH THREE TIMES DAILY AS NEEDED FOR MUSCLE SPASMS. 90 tablet 1  . Diclofenac Sodium (PENNSAID) 2 %  SOLN Place 2 g onto the skin 2 (two) times daily. 112 g 3  . doxycycline (VIBRA-TABS) 100 MG tablet TAKE 1 TABLET (100 MG TOTAL) BY MOUTH 2 (TWO) TIMES DAILY. (Patient not taking: Reported on 08/28/2020) 20 tablet 0  . DULoxetine (CYMBALTA) 60 MG capsule Take 60 mg by mouth daily.    Marland Kitchen esomeprazole (NEXIUM) 40 MG capsule TAKE 1 CAPSULE BY MOUTH 2 TIMES DAILY BEFORE A MEAL. 180 capsule 3  . fexofenadine (ALLEGRA) 180 MG tablet Take 1 tablet (180 mg total) by mouth daily. 30 tablet 2  . gabapentin (NEURONTIN) 300 MG capsule TAKE 2 CAPSULES BY MOUTH 2 TIMES DAILY 120 capsule 0  . glucose blood (FREESTYLE LITE) test strip USE AS INSTRUCTED FOUR TIMES DAILY E11.9 (Patient not taking: Reported on 08/28/2020) 400 strip 12  . HYDROcodone-homatropine (HYCODAN) 5-1.5 MG/5ML syrup TAKE 5 MLS BY MOUTH EVERY 6 (SIX) HOURS AS NEEDED FOR COUGH. 180 mL 0  . insulin aspart (NOVOLOG) 100 UNIT/ML FlexPen INJECT 5 TO 8 UNITS INTO THE SKIN THREE TIMES DAILY BEFORE MEALS. 15 mL 5  . insulin aspart (NOVOLOG) 100 UNIT/ML injection Inject 5-8 Units into the skin 3 (three) times daily before meals. 24 mL 1  . insulin glargine (LANTUS) 100 UNIT/ML Solostar Pen INJECT 32 UNITS INTO THE SKIN DAILY. 30 mL 1  . Insulin Pen Needle 32G X 4 MM MISC USE AS DIRECTED. 200 each 3  . Insulin Pen Needle 32G X 4 MM MISC USE AS DIRECTED FOUR TIMES DAILY 200 each 99  . Insulin Regular Human (NOVOLIN R FLEXPEN) 100 UNIT/ML SOPN inject 36 units daily    . Lancets (FREESTYLE) lancets USE 1 FOUR TIMES DAILY AS DIRECTED. (Patient not taking: Reported on 08/28/2020) 400 each 12  . Lasmiditan Succinate (REYVOW) 100 MG TABS Take 100 mg by mouth as needed. Take 1 tablet for headache. No more than 1 tablet in 24 hours 8 tablet 3  . linaclotide (LINZESS) 145 MCG CAPS capsule TAKE 1 CAPSULE (145 MCG TOTAL) BY MOUTH DAILY BEFORE BREAKFAST. 30 capsule 2  . meclizine (ANTIVERT) 12.5 MG tablet Take 1  tablet (12.5 mg total) by mouth 3 (three) times daily as  needed for dizziness. 40 tablet 1  . meloxicam (MOBIC) 15 MG tablet TAKE 1 TABLET (15 MG TOTAL) BY MOUTH DAILY AS NEEDED FOR PAIN. 30 tablet 1  . metoCLOPramide (REGLAN) 5 MG tablet Take 1 tablet (5 mg total) by mouth every 8 (eight) hours as needed for nausea or vomiting. 40 tablet 1  . Multiple Vitamin (MULTIVITAMIN WITH MINERALS) TABS tablet Take 1 tablet by mouth daily.    . naproxen (NAPROSYN) 500 MG tablet Take 1 tablet (500 mg total) by mouth 2 (two) times daily. 30 tablet 0  . nystatin-triamcinolone ointment (MYCOLOG) APPLY 1 APPLICATION TOPICALLY 2 (TWO) TIMES DAILY. 30 g 2  . ondansetron (ZOFRAN ODT) 8 MG disintegrating tablet Take 1 tablet (8 mg total) by mouth every 8 (eight) hours as needed for nausea or vomiting. 30 tablet 0  . ondansetron (ZOFRAN) 4 MG tablet TAKE 1 TABLET BY MOUTH EVERY 8 HOURS AS NEEDED FOR NAUSEA OR VOMITING. 40 tablet 1  . pantoprazole (PROTONIX) 40 MG tablet Take by mouth.    . promethazine (PHENERGAN) 25 MG tablet TAKE 1 TABLET BY MOUTH EVERY 6 HOURS AS NEEDED FOR NAUSEA 20 tablet 5  . promethazine-dextromethorphan (PROMETHAZINE-DM) 6.25-15 MG/5ML syrup Take 5 mLs by mouth 4 (four) times daily as needed for cough. 118 mL 0  . rosuvastatin (CRESTOR) 40 MG tablet TAKE 1 TABLET (40 MG TOTAL) BY MOUTH DAILY. 90 tablet 3  . Semaglutide,0.25 or 0.5MG /DOS, 2 MG/1.5ML SOPN INJECT 0.5 MG INTO THE SKIN ONCE A WEEK. 4.5 mL 3  . traMADol (ULTRAM) 50 MG tablet TAKE 1 TABLET BY MOUTH EVERY 8 HOURS AS NEEDED. 60 tablet 1  . triamcinolone (NASACORT) 55 MCG/ACT AERO nasal inhaler Place 2 sprays into the nose daily. 1 Inhaler 12   No current facility-administered medications on file prior to visit.   Allergies  Allergen Reactions  . Lipitor [Atorvastatin]     REACTION: myalgias  . Nitroglycerin    Family History  Problem Relation Age of Onset  . Hypertension Father   . Diabetes Father   . Asthma Father   . Cervical cancer Maternal Aunt   . Heart disease Maternal Aunt    . Anesthesia problems Neg Hx   . Colon cancer Neg Hx   . Breast cancer Neg Hx     Objective:   Physical Exam BP 118/78 (BP Location: Right Arm, Patient Position: Sitting, Cuff Size: Normal)   Pulse (!) 106   Ht 5' (1.524 m)   Wt 143 lb 6.4 oz (65 kg)   SpO2 99%   BMI 28.01 kg/m  Body mass index is 28.01 kg/m. Wt Readings from Last 3 Encounters:  09/16/20 143 lb 6.4 oz (65 kg)  08/28/20 144 lb (65.3 kg)  07/31/20 143 lb (64.9 kg)   Constitutional: + slightly overweight, in NAD Eyes: PERRLA, EOMI, no exophthalmos ENT: moist mucous membranes, no thyromegaly, no cervical lymphadenopathy Cardiovascular: tachycardia, RR, No MRG Respiratory: CTA B Gastrointestinal: abdomen soft, NT, ND, BS+ Musculoskeletal: no deformities, strength intact in all 4 Skin: moist, warm, no rashes Neurological: no tremor with outstretched hands, DTR normal in all 4  Assessment:     1. DM2, uncontrolled, insulin-dependent, with probable complications - ? peripheral neuropathy  2. HL  3.  Overweight  Plan:     1. Patient with history of uncontrolled type 2 diabetes, on basal-bolus insulin regimen and GLP-1 receptor agonist, increased at last visit.  She  is usually noncompliant with visits and medications.  HbA1c was much better, at 7.8% 3 months ago.  At last visit, sugars were at goal in the morning and close to goal before dinner.  She was not checking sugars after dinner and I strongly advised her to start.  She did have episodes of hypoglycemia, as low as 49, and she lost consciousness twice before last visit.  To avoid further hypoglycemia we decreased her Lantus dose and increase the Ozempic dose. -At today's visit, she is only checking sugars in the morning and they are almost all at goal.  She did have one blood sugar in the 70s and one in the 60s since last visit.  The sugars have significantly improved so we can back off her Lantus dose.  Also, since she is not taking the rapid acting insulin  consistently and only when sugars are higher than 120, I advised her to stop NovoLog.  I did advise her to check sugars later in the day and let me know if they increase, in which case, we can increase the Ozempic dose.  She tolerates this well.  - I advised her to: Patient Instructions  Please decrease: - Lantus 28 units in a.m.  Continue: - Ozempic 0.5 mg weekly in a.m.  You can stop Novolog.  Please return in 3 months with your sugar log.   - we checked her HbA1c: 6.6% (better) - advised to check sugars at different times of the day - 3x a day, rotating check times - advised for yearly eye exams >> she is UTD - return to clinic in 3 months  2. HL -Reviewed latest lipid panel from earlier this month: LDL above target, triglycerides and HDL at goal: Lab Results  Component Value Date   CHOL 229 (H) 08/28/2020   HDL 57.10 08/28/2020   LDLCALC 151 (H) 08/28/2020   LDLDIRECT 191.0 08/23/2018   TRIG 103.0 08/28/2020   CHOLHDL 4 08/28/2020  -On Crestor 40 mg daily -missed doses before the above results returned as she ran out.  Now taking it consistently.  No side effects.  3.  Overweight -We will continue Ozempic which should also help with weight loss -At last visit she was net -11 pounds since the previous visit, but also due to Covid 19 hospitalization -gained 3 lbs since last OV  Philemon Kingdom, MD PhD [facility] Endocrinology

## 2020-09-16 NOTE — Patient Instructions (Addendum)
Please decrease: - Lantus 28 units in a.m.  Continue: - Ozempic 0.5 mg weekly in a.m.  You can stop Novolog.  Please return in 3 months with your sugar log.

## 2020-09-24 NOTE — Progress Notes (Signed)
[name] [place] Phone: [phone] Subjective:   Rachel [name], am serving as a scribe for Dr. Hulan Saas. This visit occurred during the SARS-CoV-2 public health emergency.  Safety protocols were in place, including screening questions prior to the visit, additional usage of staff PPE, and extensive cleaning of exam room while observing appropriate contact time as indicated for disinfecting solutions.   I'm seeing this patient by the request  of:  Biagio Borg, MD  CC: Neck pain, back pain and pain all over follow-up  [id number]  Rachel [name] is a 55 y.o. female coming in with complaint of back and neck pain. OMT 08/28/2020. Patient states back is hurting and both of her knees are bothering her as well.  Medications patient has been prescribed: None    Patient is doing well and has had better A1c recently with her diabetes.     Reviewed prior external information including notes and imaging from previsou exam, outside providers and external EMR if available.   As well as notes that were available from care everywhere and other healthcare systems.  Past medical history, social, surgical and family history all reviewed in electronic medical record.  No pertanent information unless stated regarding to the chief complaint.   Past Medical History:  Diagnosis Date  . ALLERGIC RHINITIS 10/04/2007  . Anemia 01/21/2011  . Anxiety 11/15/2018  . ASTHMA 08/02/2007   INHALERS ONLY IN [place]  . Asthma 08/02/2007   Qualifier: Diagnosis of  By: Wynona Luna   . Blood transfusion without reported diagnosis    with first child   . Cervical radiculopathy 01/21/2011  . Cervicogenic headache 04/27/2017  . COMMON MIGRAINE 05/15/2009  . Depression 01/09/2010   Qualifier: Diagnosis of  By: Jenny Reichmann MD, Hunt Oris   . DIABETES MELLITUS, TYPE II 08/02/2007  . Dysphagia, pharyngoesophageal phase 06/12/2014  . GERD 08/02/2007  .  HYPERLIPIDEMIA 08/02/2007  . Hyperlipidemia 08/02/2007   Qualifier: Diagnosis of  By: Wynona Luna   . INSOMNIA-SLEEP DISORDER-UNSPEC 01/04/2008  . INTERMITTENT VERTIGO 05/15/2009  . LIBIDO, DECREASED 01/09/2010  . Migraine without aura 05/15/2009   Qualifier: Diagnosis of  By: Jenny Reichmann MD, Hunt Oris   . Nonallopathic lesion of rib cage 12/21/2017  . Nonallopathic lesion of sacral region 07/29/2015  . Nonallopathic lesion of thoracic region 07/29/2015  . Osteoporosis 04/25/2019  . 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 Rheumatoid factor positive 02/10/2016  . Right knee pain 01/31/2019   Injected January 31, 2019  . Type 2 diabetes mellitus with hyperglycemia, with long-term current use of insulin ([place]) 08/02/2007   Qualifier: Diagnosis of  By: Wynona Luna     Allergies  Allergen Reactions  . Lipitor [Atorvastatin]     REACTION: myalgias  . Nitroglycerin      Review of Systems:  No headache, visual changes, nausea, vomiting, diarrhea, constipation, dizziness, abdominal pain, skin rash, fevers, chills, night sweats, weight loss, swollen lymph nodes,  joint swelling, chest pain, shortness of breath, mood changes. POSITIVE muscle aches, body aches  Objective  Blood pressure 108/60, pulse 98, height 5' (1.524 m), weight 145 lb (65.8 kg), SpO2 99 %.   General: No apparent distress alert and oriented x3 mood and affect normal, dressed appropriately.  HEENT: Pupils equal, extraocular movements intact  Respiratory: Patient's speak in full sentences and does not appear short of breath  Cardiovascular: No lower extremity edema, non tender,  no erythema  Gait normal with good balance and coordination.  MSK:   Back -  Osteopathic findings  C7 flexed rotated and side bent left T3 extended rotated and side bent right inhaled rib T9 extended rotated and side bent left L2 flexed rotated and side bent right Sacrum right on right       Assessment and Plan:  Patellofemoral  syndrome of both knees Bilateral injections given today, tolerated procedure well, discussed icing regimen and home exercises, encourage patient to continue to stay active.  Discussed posture and ergonomics, discussed with patient about icing regimen patient's x-rays and previous work-up has been fairly unremarkable.  Follow-up again in 6 weeks  Cervicogenic headache Patient has responded relatively well to home exercises, icing regimen, patient is responding well to manipulation.  Discussed with patient about icing regimen discussed patient's medications again in greater length.  Chronic, with mild exacerbation.  Patient will be traveling for the next 2 months and we will see her when she gets back.    Nonallopathic problems  Decision today to treat with OMT was based on Physical Exam  After verbal consent patient was treated with HVLA, ME, FPR techniques in cervical, rib, thoracic, lumbar, and sacral  areas  Patient tolerated the procedure well with improvement in symptoms  Patient given exercises, stretches and lifestyle modifications  See medications in patient instructions if given  Patient will follow up in 4-8 weeks      The above documentation has been reviewed and is accurate and complete Lyndal Pulley, DO       Note: This dictation was prepared with Dragon dictation along with smaller phrase technology. Any transcriptional errors that result from this process are unintentional.

## 2020-09-25 ENCOUNTER — Ambulatory Visit (INDEPENDENT_AMBULATORY_CARE_PROVIDER_SITE_OTHER): Payer: 59 | Admitting: Family Medicine

## 2020-09-25 ENCOUNTER — Encounter: Payer: Self-pay | Admitting: Family Medicine

## 2020-09-25 ENCOUNTER — Other Ambulatory Visit: Payer: Self-pay

## 2020-09-25 VITALS — BP 108/60 | HR 98 | Ht 60.0 in | Wt 145.0 lb

## 2020-09-25 DIAGNOSIS — G4486 Cervicogenic headache: Secondary | ICD-10-CM

## 2020-09-25 DIAGNOSIS — M9904 Segmental and somatic dysfunction of sacral region: Secondary | ICD-10-CM | POA: Diagnosis not present

## 2020-09-25 DIAGNOSIS — M9901 Segmental and somatic dysfunction of cervical region: Secondary | ICD-10-CM | POA: Diagnosis not present

## 2020-09-25 DIAGNOSIS — M9903 Segmental and somatic dysfunction of lumbar region: Secondary | ICD-10-CM

## 2020-09-25 DIAGNOSIS — M9902 Segmental and somatic dysfunction of thoracic region: Secondary | ICD-10-CM | POA: Diagnosis not present

## 2020-09-25 DIAGNOSIS — M222X2 Patellofemoral disorders, left knee: Secondary | ICD-10-CM | POA: Diagnosis not present

## 2020-09-25 DIAGNOSIS — M222X1 Patellofemoral disorders, right knee: Secondary | ICD-10-CM

## 2020-09-25 DIAGNOSIS — M9908 Segmental and somatic dysfunction of rib cage: Secondary | ICD-10-CM

## 2020-09-25 NOTE — Assessment & Plan Note (Signed)
Bilateral injections given today, tolerated procedure well, discussed icing regimen and home exercises, encourage patient to continue to stay active.  Discussed posture and ergonomics, discussed with patient about icing regimen patient's x-rays and previous work-up has been fairly unremarkable.  Follow-up again in 6 weeks

## 2020-09-25 NOTE — Assessment & Plan Note (Signed)
Patient has responded relatively well to home exercises, icing regimen, patient is responding well to manipulation.  Discussed with patient about icing regimen discussed patient's medications again in greater length.  Chronic, with mild exacerbation.  Patient will be traveling for the next 2 months and we will see her when she gets back.

## 2020-09-25 NOTE — Patient Instructions (Addendum)
Good to see you  Have a great trip  Stay active Injected knees See me again in 8 weeks

## 2020-10-13 ENCOUNTER — Other Ambulatory Visit: Payer: Self-pay

## 2020-10-13 ENCOUNTER — Telehealth: Payer: Self-pay | Admitting: Neurology

## 2020-10-13 NOTE — Telephone Encounter (Signed)
New message   Going out of the country -[place] month.    1. Which medications need to be refilled? (please list name of each medication and dose if known) gabapentin (NEURONTIN) 300 MG capsule  2. Which pharmacy/location (including street and city if local pharmacy) is medication to be sent to?[facility]  3. Do they need a 30 day or 90 day supply? 90 days supply

## 2020-10-13 NOTE — Telephone Encounter (Signed)
Sent to [facility] for review.

## 2020-10-14 ENCOUNTER — Other Ambulatory Visit: Payer: Self-pay | Admitting: Internal Medicine

## 2020-10-14 ENCOUNTER — Other Ambulatory Visit: Payer: Self-pay | Admitting: Neurology

## 2020-10-14 ENCOUNTER — Other Ambulatory Visit (HOSPITAL_COMMUNITY): Payer: Self-pay

## 2020-10-14 MED ORDER — AJOVY 225 MG/1.5ML ~~LOC~~ SOAJ
225.0000 mg | SUBCUTANEOUS | 2 refills | Status: DC
  Filled 2020-10-14: qty 1.5, 30d supply, fill #0
  Filled 2020-12-29: qty 1.5, 30d supply, fill #1

## 2020-10-14 MED ORDER — BUTALBITAL-APAP-CAFFEINE 50-325-40 MG PO TABS
1.0000 | ORAL_TABLET | Freq: Four times a day (QID) | ORAL | 3 refills | Status: DC | PRN
  Filled 2020-10-14: qty 30, 15d supply, fill #0
  Filled 2020-12-29: qty 30, 15d supply, fill #1
  Filled 2021-02-17: qty 30, 15d supply, fill #2
  Filled 2021-03-18: qty 30, 15d supply, fill #3

## 2020-10-14 MED ORDER — GABAPENTIN 300 MG PO CAPS
600.0000 mg | ORAL_CAPSULE | Freq: Two times a day (BID) | ORAL | 2 refills | Status: DC
  Filled 2020-10-14: qty 120, 30d supply, fill #0

## 2020-10-14 MED FILL — Ondansetron HCl Tab 4 MG: ORAL | 6 days supply | Qty: 18 | Fill #0 | Status: AC

## 2020-11-25 ENCOUNTER — Ambulatory Visit: Payer: 59 | Admitting: Family Medicine

## 2020-12-12 ENCOUNTER — Ambulatory Visit: Payer: Self-pay | Admitting: Neurology

## 2020-12-16 NOTE — Progress Notes (Deleted)
NEUROLOGY FOLLOW UP OFFICE NOTE  Uilani Sanville [id number]  Assessment/Plan:   *** Migraine without Aura, Without Status Migrainosus, Not Intractable   For preventative management, Ajovy Q4wks  For abortive therapy, ***  Limit use of pain relievers to no more than 2 days out of week to prevent risk of rebound or medication-overuse headache.  Keep headache diary  Exercise, hydration, caffeine cessation, sleep hygiene, monitor for and avoid triggers  Consider:  magnesium citrate 400mg  daily, riboflavin 400mg  daily, and coenzyme Q10 100mg  three times daily Always keep in mind that currently taking a hormone or birth control may be a possible trigger or aggravating factor for migraine. Follow up ***  Subjective:  Ecko Beasley is a 55 year old right-handed female with type 2 diabetes, hyperlipidemia, GERD, asthma, and migraine who follows up for migraines.   UPDATE: In January, gabapentin was increased.  ***   Current NSAIDS: none Current analgesics: Excedrin Migraine, Tramadol (for back pain, ineffective for headache, not taken lately) Current triptans: None Current ergotamine: None Current anti-emetic: none Current muscle relaxants: none Current anti-anxiolytic: None Current sleep aide: None Current Antihypertensive medications: None Current Antidepressant medications: Cymbalta 60mg  daily Current Anticonvulsant medications: Gabapentin 600 mg twice daily (for back pain) Current anti-CGRP: Melodie Bouillon 100mg  Current Vitamins/Herbal/Supplements: Turmeric Current Antihistamines/Decongestants: mecllzine, Allegra   Caffeine: One cup of coffee daily Diet: Drinks plenty of water.  No soda. Exercise: No Depression: No; Anxiety: yes.  Her mother has been ill.  She has been confused. Other pain: Knee pain. Sleep hygiene: Poor.  Worrying about her mother.   HISTORY: Onset:  2011.  She does have remote history of headaches when she was younger. Location:  Back of head and  radiates to the front Quality:  Shooting up from back of head, pounding on top and front of head Initial Intensity:  8/10 Aura:  no Prodrome:  no Associated symptoms:  Nausea, photophobia, phonophobia, blurred vision (she gets annual eye exams due to diabetes) Initial Duration:  All day Initial Frequency:  Almost daily (cannot function 6 days per month) Triggers/exacerbating factors:  wine Relieving factors:  No Activity:  Cannot work about 6 days per month (she works as an Advertising account planner)   Past NSAIDS: Cambia (made headache worse), flurbiprofen, Sprix nasal spray, ibuprofen; naproxen Past analgesics: Fioricet, Excedrin, Tylenol Past abortive triptans: Sumatriptan 100 mg, Zembrace-SymTouch (did not like the feeling), Zomig 5 mg NS (did not like the way it made her feel), Maxalt (dizziness, ineffective) Past abortive ergotamine: Cafergot Past muscle relaxants: Robaxin, Flexeril Past anti-emetic: Zofran 8mg , Reglan, Phenergan Past antihypertensive medications: Atenolol Past antidepressant medications: Venlafaxine XR 150 mg; nortriptyline Past anticonvulsant medications: Topiramate, Depakote Past anti-CGRP: Aimovig 140mg , Emgality, Nurtec (dizziness) Past vitamins/Herbal/Supplements: None Past antihistamines/decongestants: None Other past therapies: Botox   Frequent Falls: For the past few years, she has had falls, but they have become more frequent over the past year.  Previously, she was diagnosed with vertigo.  However, she reports that she doesn't note spinning or lightheadedness.  She says she "just falls".  On one occasion, she was pushing the seat forward while sitting and just fell over.  Otherwise, it always occurs while walking or on her feet.  She denies double vision or focal numbness or weakness.  MRI of the brain with seizure protocol was performed on 08/02/11, which was unremarkable.  Sleep deprived EEG was normal.  She has had PT, which was ineffective.  She does  report that she sometimes trips while walking but doesn't  fall.   Cervical plain films from 05/07/15 showed mild degenerative disc disease at C5-6, but otherwise unremarkable.  Lumbar films from 06/10/15 were personally reviewed and were negative.  She underwent anothner MRI of brain with and without contrast on 02/10/16, and was stable compared to prior MRI from 2013.  NCV-EMG from 05/13/16 was negative for neuropathy or lumbosacral radiculopathy.  PAST MEDICAL HISTORY: Past Medical History:  Diagnosis Date   ALLERGIC RHINITIS 10/04/2007   Anemia 01/21/2011   Anxiety 11/15/2018   ASTHMA 08/02/2007   INHALERS ONLY IN [place]   Asthma 08/02/2007   Qualifier: Diagnosis of  By: Wynona Luna    Blood transfusion without reported diagnosis    with first child    Cervical radiculopathy 01/21/2011   Cervicogenic headache 04/27/2017   COMMON MIGRAINE 05/15/2009   Depression 01/09/2010   Qualifier: Diagnosis of  By: Jenny Reichmann MD, [place], TYPE II 08/02/2007   Dysphagia, pharyngoesophageal phase 06/12/2014   GERD 08/02/2007   HYPERLIPIDEMIA 08/02/2007   Hyperlipidemia 08/02/2007   Qualifier: Diagnosis of  By: Wynona Luna    INSOMNIA-SLEEP DISORDER-UNSPEC 01/04/2008   INTERMITTENT VERTIGO 05/15/2009   LIBIDO, DECREASED 01/09/2010   Migraine without aura 05/15/2009   Qualifier: Diagnosis of  By: Jenny Reichmann MD, Hunt Oris    Nonallopathic lesion of rib cage 12/21/2017   Nonallopathic lesion of sacral region 07/29/2015   Nonallopathic lesion of thoracic region 07/29/2015   Osteoporosis 04/25/2019   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   Rheumatoid factor positive 02/10/2016   Right knee pain 01/31/2019   Injected January 31, 2019   Type 2 diabetes mellitus with hyperglycemia, with long-term current use of insulin ([place]) 08/02/2007   Qualifier: Diagnosis of  By: Wynona Luna     MEDICATIONS: Current Outpatient Medications on File Prior to Visit  Medication Sig Dispense Refill    albuterol (VENTOLIN HFA) 108 (90 Base) MCG/ACT inhaler INHALE 2 PUFFS INTO THE LUNGS EVERY 6 (SIX) HOURS AS NEEDED FOR WHEEZING OR SHORTNESS OF BREATH. 18 g 5   amLODipine (NORVASC) 10 MG tablet      aspirin 81 MG EC tablet Take 81 mg by mouth daily.     atenolol (TENORMIN) 25 MG tablet Take 1 tablet (25 mg total) by mouth daily. X 7 days, then increase to 2 tabs (50mg ) daily 60 tablet 3   butalbital-acetaminophen-caffeine (FIORICET) 50-325-40 MG tablet Take 1 tablet by mouth every 6 (six) hours as needed for headache. Max 2 tablets per day 30 tablet 3   Cholecalciferol (VITAMIN D) 50 MCG (2000 UT) CAPS Take 4,000 Units by mouth daily.     cyclobenzaprine (FLEXERIL) 5 MG tablet TAKE 1 TABLET BY MOUTH THREE TIMES DAILY AS NEEDED FOR MUSCLE SPASMS. (Patient taking differently: Take by mouth 3 (three) times daily as needed. for muscle spams) 90 tablet 1   Diclofenac Sodium (PENNSAID) 2 % SOLN Place 2 g onto the skin 2 (two) times daily. 112 g 3   doxycycline (VIBRA-TABS) 100 MG tablet TAKE 1 TABLET (100 MG TOTAL) BY MOUTH 2 (TWO) TIMES DAILY. 20 tablet 0   DULoxetine (CYMBALTA) 60 MG capsule Take 60 mg by mouth daily.     esomeprazole (NEXIUM) 40 MG capsule TAKE 1 CAPSULE BY MOUTH 2 TIMES DAILY BEFORE A MEAL. 180 capsule 3   Fremanezumab-vfrm (AJOVY) 225 MG/1.5ML SOAJ Inject 225 mg into the skin every 28 (twenty-eight) days. 1.5 mL 2   gabapentin (NEURONTIN) 300 MG  capsule TAKE 2 CAPSULES BY MOUTH 2 TIMES DAILY 120 capsule 2   glucose blood (ACCU-CHEK GUIDE) test strip Use as instructed 2x a day 200 each 12   insulin glargine (LANTUS) 100 UNIT/ML Solostar Pen Inject under skin 28 units daily 30 mL 1   Insulin Pen Needle 32G X 4 MM MISC USE AS DIRECTED. 200 each 3   Insulin Regular Human (NOVOLIN R FLEXPEN) 100 UNIT/ML SOPN inject 36 units daily     Lancets (FREESTYLE) lancets USE 1 FOUR TIMES DAILY AS DIRECTED. (Patient taking differently: USE 1 FOUR TIMES DAILY AS DIRECTED.) 400 each 12   Lasmiditan  Succinate (REYVOW) 100 MG TABS Take 100 mg by mouth as needed. Take 1 tablet for headache. No more than 1 tablet in 24 hours 8 tablet 3   linaclotide (LINZESS) 145 MCG CAPS capsule TAKE 1 CAPSULE (145 MCG TOTAL) BY MOUTH DAILY BEFORE BREAKFAST. 30 capsule 2   meclizine (ANTIVERT) 12.5 MG tablet Take 1 tablet (12.5 mg total) by mouth 3 (three) times daily as needed for dizziness. 40 tablet 1   meloxicam (MOBIC) 15 MG tablet TAKE 1 TABLET (15 MG TOTAL) BY MOUTH DAILY AS NEEDED FOR PAIN. 30 tablet 1   metoCLOPramide (REGLAN) 5 MG tablet Take 1 tablet (5 mg total) by mouth every 8 (eight) hours as needed for nausea or vomiting. 40 tablet 1   Multiple Vitamin (MULTIVITAMIN WITH MINERALS) TABS tablet Take 1 tablet by mouth daily.     naproxen (NAPROSYN) 500 MG tablet Take 1 tablet (500 mg total) by mouth 2 (two) times daily. 30 tablet 0   nystatin-triamcinolone ointment (MYCOLOG) APPLY 1 APPLICATION TOPICALLY 2 (TWO) TIMES DAILY. 30 g 2   ondansetron (ZOFRAN ODT) 8 MG disintegrating tablet Take 1 tablet (8 mg total) by mouth every 8 (eight) hours as needed for nausea or vomiting. 30 tablet 0   ondansetron (ZOFRAN) 4 MG tablet TAKE 1 TABLET BY MOUTH EVERY 8 HOURS AS NEEDED FOR NAUSEA OR VOMITING. 40 tablet 1   pantoprazole (PROTONIX) 40 MG tablet Take by mouth.     promethazine (PHENERGAN) 25 MG tablet TAKE 1 TABLET BY MOUTH EVERY 6 HOURS AS NEEDED FOR NAUSEA (Patient taking differently: Take by mouth every 6 (six) hours as needed. for nausea) 20 tablet 5   promethazine-dextromethorphan (PROMETHAZINE-DM) 6.25-15 MG/5ML syrup Take 5 mLs by mouth 4 (four) times daily as needed for cough. 118 mL 0   rosuvastatin (CRESTOR) 40 MG tablet TAKE 1 TABLET (40 MG TOTAL) BY MOUTH DAILY. 90 tablet 3   Semaglutide,0.25 or 0.5MG /DOS, 2 MG/1.5ML SOPN INJECT 0.5 MG INTO THE SKIN ONCE A WEEK. 4.5 mL 3   triamcinolone (NASACORT) 55 MCG/ACT AERO nasal inhaler Place 2 sprays into the nose daily. 1 Inhaler 12   No current  facility-administered medications on file prior to visit.    ALLERGIES: Allergies  Allergen Reactions   Lipitor [Atorvastatin]     REACTION: myalgias   Nitroglycerin     FAMILY HISTORY: Family History  Problem Relation Age of Onset   Hypertension Father    Diabetes Father    Asthma Father    Cervical cancer Maternal Aunt    Heart disease Maternal Aunt    Anesthesia problems Neg Hx    Colon cancer Neg Hx    Breast cancer Neg Hx       Objective:  *** General: No acute distress.  Patient appears well-groomed.   Head:  Normocephalic/atraumatic Eyes:  Fundi examined but not visualized Neck:  supple, no paraspinal tenderness, full range of motion Heart:  Regular rate and rhythm Lungs:  Clear to auscultation bilaterally Back: No paraspinal tenderness Neurological Exam: alert and oriented to person, place, and time.  Speech fluent and not dysarthric, language intact.  CN II-XII intact. Bulk and tone normal, muscle strength 5/5 throughout.  Sensation to light touch intact.  Deep tendon reflexes 2+ throughout, toes downgoing.  Finger to nose testing intact.  Gait normal, Romberg negative.   Metta Clines, DO  CC: Cathlean Cower, MD

## 2020-12-17 ENCOUNTER — Ambulatory Visit: Payer: 59 | Admitting: Internal Medicine

## 2020-12-17 NOTE — Progress Notes (Deleted)
Subjective:     Patient ID: Rachel [name], female   DOB: 01/13/1966, 55 y.o.   MRN: [id number]  This visit occurred during the SARS-CoV-2 public health emergency.  Safety protocols were in place, including screening questions prior to the visit, additional usage of staff PPE, and extensive cleaning of exam room while observing appropriate contact time as indicated for disinfecting solutions.   HPI Ms. Rachel [name] is a  55 y.o. woman, returning for f/u for DM2, dx 1987, uncontrolled, insulin-dependent, with probable complications (? peripheral neuropathy).  Last visit 3 months ago.  Interim history: No increased urination, blurry vision, nausea, chest pain.  Reviewed HbA1c levels: Lab Results  Component Value Date   HGBA1C 6.6 (A) 09/16/2020   HGBA1C 7.8 (A) 06/17/2020   HGBA1C 10.0 (H) 08/29/2019  02/17/2017: HbA1c calculated from fructosamine is better, at 8.34%, but still high. Prev. 10.1%.  Reviewed history: She came off all DM medicines for 6 mo before the HbA1c in 09/2013. She again ran out of all meds in 04/2016.  She is  on: - Lantus 40 >> 34 >> 36 >> 32 units in a.m. -  >> stopped 09/2020 due to good control - Ozempic 0.5 mg weekly - added 06/2019 >> 0.25 >> 0.5 mg weekly - no nausea Stopped Januvia 100 mg in am >> then Ozempic 0.5 mg weekly She is off Metformin XR 1000 mg 2x a day with b'fast and dinner >> upset stomach >> stopped 82/9937 We triedTrulicity 1.5 mg weekly >> worked great but had to stop b/c GERD >> stopped. We tried Jardiance 25 mg daily >> started 08/2016 >> but stopped recently 2/2 recurrent UTIs She was on Glipizide 5 mg bid - added 04/2015 >> was not taking it b/c low CBGs. We stopped Glipizide XL 5 mg in 07/2014. She had episodes of hypoglycemia with Amaryl in the past. She was on Bydureon 2 mg weekly (had nausea).  She checks her sugars 1-2 times a day:,  - am: 180-300 >> 138-140 >> 100-130 >> 66, 74, 84-141, 145  - after b'fast: 158-251 >> n/c -  before lunch: 180-200 >> n/c >> 60 x1 >> n/c - after lunch: up to 200s >> n/c - before dinner:  180-200 >> 137, 150 >> 140-150  - after dinner: 166-245 >> n/c - bedtime: n/c >> 300 >> 100-125 (has snack) >> n/c She has hypoglycemia awareness in the 90s. Lowest: 38 (while on Trulicity) >> ... 49 (05/2020) at night - lost consciousness x2. Highest 600 >> ... >> 450 >> 150 >> HI.  She saw nutrition in the past  -+ HL. Last lipids: Lab Results  Component Value Date   CHOL 229 (H) 08/28/2020   HDL 57.10 08/28/2020   LDLCALC 151 (H) 08/28/2020   LDLDIRECT 191.0 08/23/2018   TRIG 103.0 08/28/2020   CHOLHDL 4 08/28/2020  On Crestor 40 (ran out Crestor) >> restarted.  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  Review of Systems Constitutional: no weight gain/no weight loss, no fatigue, no subjective hyperthermia, no subjective hypothermia Eyes: no blurry vision, no xerophthalmia ENT: no sore throat, no nodules palpated in neck, no dysphagia, no odynophagia, no hoarseness Cardiovascular: no CP/no SOB/no palpitations/no leg  swelling Respiratory: no cough/no SOB/no wheezing Gastrointestinal: no N/no V/no D/no C/no acid reflux Musculoskeletal: no muscle aches/no joint aches Skin: no rashes, no hair loss Neurological: no tremors/no numbness/no tingling/no dizziness  I reviewed pt's medications, allergies, PMH, social hx, family hx, and changes were documented in the history of present illness. Otherwise, unchanged from my initial visit note.  Past Medical History:  Diagnosis Date   ALLERGIC RHINITIS 10/04/2007   Anemia 01/21/2011   Anxiety 11/15/2018   ASTHMA 08/02/2007   INHALERS ONLY IN [place]   Asthma 08/02/2007   Qualifier: Diagnosis of  By: Wynona Luna    Blood transfusion without reported diagnosis    with first child    Cervical radiculopathy 01/21/2011   Cervicogenic headache 04/27/2017   COMMON MIGRAINE 05/15/2009   Depression 01/09/2010   Qualifier: Diagnosis of  By: Jenny Reichmann MD, [place], TYPE II 08/02/2007   Dysphagia, pharyngoesophageal phase 06/12/2014   GERD 08/02/2007   HYPERLIPIDEMIA 08/02/2007   Hyperlipidemia 08/02/2007   Qualifier: Diagnosis of  By: Wynona Luna    INSOMNIA-SLEEP DISORDER-UNSPEC 01/04/2008   INTERMITTENT VERTIGO 05/15/2009   LIBIDO, DECREASED 01/09/2010   Migraine without aura 05/15/2009   Qualifier: Diagnosis of  By: Jenny Reichmann MD, [place]    Nonallopathic lesion of rib cage 12/21/2017   Nonallopathic lesion of sacral region 07/29/2015   Nonallopathic lesion of thoracic region 07/29/2015   Osteoporosis 04/25/2019   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   Rheumatoid factor positive 02/10/2016   Right knee pain 01/31/2019   Injected January 31, 2019   Type 2 diabetes mellitus with hyperglycemia, with long-term current use of insulin ([place]) 08/02/2007   Qualifier: Diagnosis of  By: Wynona Luna    Past Surgical History:  Procedure Laterality Date   CESAREAN SECTION     x 3   CYST EXCISION     left knee   PAROTIDECTOMY  10/21/2011   Procedure: PAROTIDECTOMY;  Surgeon: Melida Quitter, MD;  Location: [facility] OR;  Service: ENT;  Laterality: Left;   Social History   Socioeconomic History   Marital status: Married    Spouse name: [name]   Number of children: 3   Years of education: Degree   Highest education level: Not on file  Occupational History   Occupation: Chartered certified accountant    Employer: [employer]   Occupation: [employer]    Employer: [employer]  Tobacco Use   Smoking status: Never   Smokeless tobacco: Never  Vaping Use   Vaping Use: Never used  Substance and Sexual Activity   Alcohol use: No    Alcohol/week: 0.0 standard drinks   Drug use: No   Sexual  activity: Not on file  Other Topics Concern   Not on file  Social History Narrative   She has 3 daughters.   Patient has a 4 year degree.    Patient working at [employer].    Patient is married to [place].   Social Determinants of Health   Financial Resource Strain: Not on file  Food Insecurity: Not on file  Transportation Needs: Not on file  Physical Activity: Not on file  Stress: Not on file  Social Connections: Not on file  Intimate Partner Violence: Not on file   Current Outpatient Medications on File Prior to Visit  Medication Sig Dispense Refill   albuterol (VENTOLIN HFA) 108 (90 Base) MCG/ACT inhaler INHALE 2 PUFFS INTO THE LUNGS EVERY  6 (SIX) HOURS AS NEEDED FOR WHEEZING OR SHORTNESS OF BREATH. 18 g 5   amLODipine (NORVASC) 10 MG tablet      aspirin 81 MG EC tablet Take 81 mg by mouth daily.     atenolol (TENORMIN) 25 MG tablet Take 1 tablet (25 mg total) by mouth daily. X 7 days, then increase to 2 tabs (50mg ) daily 60 tablet 3   butalbital-acetaminophen-caffeine (FIORICET) 50-325-40 MG tablet Take 1 tablet by mouth every 6 (six) hours as needed for headache. Max 2 tablets per day 30 tablet 3   Cholecalciferol (VITAMIN D) 50 MCG (2000 UT) CAPS Take 4,000 Units by mouth daily.     cyclobenzaprine (FLEXERIL) 5 MG tablet TAKE 1 TABLET BY MOUTH THREE TIMES DAILY AS NEEDED FOR MUSCLE SPASMS. (Patient taking differently: Take by mouth 3 (three) times daily as needed. for muscle spams) 90 tablet 1   Diclofenac Sodium (PENNSAID) 2 % SOLN Place 2 g onto the skin 2 (two) times daily. 112 g 3   doxycycline (VIBRA-TABS) 100 MG tablet TAKE 1 TABLET (100 MG TOTAL) BY MOUTH 2 (TWO) TIMES DAILY. 20 tablet 0   DULoxetine (CYMBALTA) 60 MG capsule Take 60 mg by mouth daily.     esomeprazole (NEXIUM) 40 MG capsule TAKE 1 CAPSULE BY MOUTH 2 TIMES DAILY BEFORE A MEAL. 180 capsule 3   Fremanezumab-vfrm (AJOVY) 225 MG/1.5ML SOAJ Inject 225 mg into the skin every 28 (twenty-eight) days. 1.5 mL 2    gabapentin (NEURONTIN) 300 MG capsule TAKE 2 CAPSULES BY MOUTH 2 TIMES DAILY 120 capsule 2   glucose blood (ACCU-CHEK GUIDE) test strip Use as instructed 2x a day 200 each 12   insulin glargine (LANTUS) 100 UNIT/ML Solostar Pen Inject under skin 28 units daily 30 mL 1   Insulin Pen Needle 32G X 4 MM MISC USE AS DIRECTED. 200 each 3   Insulin Regular Human (NOVOLIN R FLEXPEN) 100 UNIT/ML SOPN inject 36 units daily     Lancets (FREESTYLE) lancets USE 1 FOUR TIMES DAILY AS DIRECTED. (Patient taking differently: USE 1 FOUR TIMES DAILY AS DIRECTED.) 400 each 12   Lasmiditan Succinate (REYVOW) 100 MG TABS Take 100 mg by mouth as needed. Take 1 tablet for headache. No more than 1 tablet in 24 hours 8 tablet 3   linaclotide (LINZESS) 145 MCG CAPS capsule TAKE 1 CAPSULE (145 MCG TOTAL) BY MOUTH DAILY BEFORE BREAKFAST. 30 capsule 2   meclizine (ANTIVERT) 12.5 MG tablet Take 1 tablet (12.5 mg total) by mouth 3 (three) times daily as needed for dizziness. 40 tablet 1   meloxicam (MOBIC) 15 MG tablet TAKE 1 TABLET (15 MG TOTAL) BY MOUTH DAILY AS NEEDED FOR PAIN. 30 tablet 1   metoCLOPramide (REGLAN) 5 MG tablet Take 1 tablet (5 mg total) by mouth every 8 (eight) hours as needed for nausea or vomiting. 40 tablet 1   Multiple Vitamin (MULTIVITAMIN WITH MINERALS) TABS tablet Take 1 tablet by mouth daily.     naproxen (NAPROSYN) 500 MG tablet Take 1 tablet (500 mg total) by mouth 2 (two) times daily. 30 tablet 0   nystatin-triamcinolone ointment (MYCOLOG) APPLY 1 APPLICATION TOPICALLY 2 (TWO) TIMES DAILY. 30 g 2   ondansetron (ZOFRAN ODT) 8 MG disintegrating tablet Take 1 tablet (8 mg total) by mouth every 8 (eight) hours as needed for nausea or vomiting. 30 tablet 0   ondansetron (ZOFRAN) 4 MG tablet TAKE 1 TABLET BY MOUTH EVERY 8 HOURS AS NEEDED FOR NAUSEA OR VOMITING. [place]  tablet 1   pantoprazole (PROTONIX) 40 MG tablet Take by mouth.     promethazine (PHENERGAN) 25 MG tablet TAKE 1 TABLET BY MOUTH EVERY 6 HOURS AS  NEEDED FOR NAUSEA (Patient taking differently: Take by mouth every 6 (six) hours as needed. for nausea) 20 tablet 5   promethazine-dextromethorphan (PROMETHAZINE-DM) 6.25-15 MG/5ML syrup Take 5 mLs by mouth 4 (four) times daily as needed for cough. 118 mL 0   rosuvastatin (CRESTOR) 40 MG tablet TAKE 1 TABLET (40 MG TOTAL) BY MOUTH DAILY. 90 tablet 3   Semaglutide,0.25 or 0.5MG /DOS, 2 MG/1.5ML SOPN INJECT 0.5 MG INTO THE SKIN ONCE A WEEK. 4.5 mL 3   triamcinolone (NASACORT) 55 MCG/ACT AERO nasal inhaler Place 2 sprays into the nose daily. 1 Inhaler 12   No current facility-administered medications on file prior to visit.   Allergies  Allergen Reactions   Lipitor [Atorvastatin]     REACTION: myalgias   Nitroglycerin    Family History  Problem Relation Age of Onset   Hypertension Father    Diabetes Father    Asthma Father    Cervical cancer Maternal Aunt    Heart disease Maternal Aunt    Anesthesia problems Neg Hx    Colon cancer Neg Hx    Breast cancer Neg Hx     Objective:   Physical Exam There were no vitals taken for this visit. There is no height or weight on file to calculate BMI. Wt Readings from Last 3 Encounters:  09/25/20 145 lb (65.8 kg)  09/16/20 143 lb 6.4 oz (65 kg)  08/28/20 144 lb (65.3 kg)   Constitutional: + slightly overweight, in NAD Eyes: PERRLA, EOMI, no exophthalmos ENT: moist mucous membranes, no thyromegaly, no cervical lymphadenopathy Cardiovascular: tachycardia, RR, No MRG Respiratory: CTA B Gastrointestinal: abdomen soft, NT, ND, BS+ Musculoskeletal: no deformities, strength intact in all 4 Skin: moist, warm, no rashes Neurological: no tremor with outstretched hands, DTR normal in all 4  Assessment:     1. DM2, uncontrolled, insulin-dependent, with probable complications - ? peripheral neuropathy  2. HL  3.  Overweight  Plan:     1. Patient with   history of uncontrolled type 2 diabetes, on basal-bolus insulin regimen and GLP-1  receptor agonist, increased at last visit.  She is usually noncompliant with visits and medications.  HbA1c was much better, at 7.8% 3 months ago.  At last visit, sugars were at goal in the morning and close to goal before dinner.  She was not checking sugars after dinner and I strongly advised her to start.  She did have episodes of hypoglycemia, as low as 49, and she lost consciousness twice before last visit.  To avoid further hypoglycemia we decreased her Lantus dose and increase the Ozempic dose. -At today's visit, she is only checking sugars in the morning and they are almost all at goal.  She did have one blood sugar in the 70s and one in the 60s since last visit.  The sugars have significantly improved so we can back off her Lantus dose.  Also, since she is not taking the rapid acting insulin consistently and only when sugars are higher than 120, I advised her to stop NovoLog.  I did advise her to check sugars later in the day and let me know if they increase, in which case, we can increase the Ozempic dose.  She tolerates this well.   - I advised her to: Patient Instructions  Please continue: - Lantus 28 units  in a.m. - Ozempic 0.5 mg weekly in a.m.  Please return in 3-4 months with your sugar log.   - we checked her HbA1c: 7%  - advised to check sugars at different times of the day - 1x a day, rotating check times - advised for yearly eye exams >> she is UTD - return to clinic in 3-4 months  2. HL -Reviewed latest lipid panel from 08/2020: High LDL, the rest of the fractions at goal Lab Results  Component Value Date   CHOL 229 (H) 08/28/2020   HDL 57.10 08/28/2020   LDLCALC 151 (H) 08/28/2020   LDLDIRECT 191.0 08/23/2018   TRIG 103.0 08/28/2020   CHOLHDL 4 08/28/2020  -She continues on Crestor 40 mg daily without side effects.  Before the above labs returned, she was missing doses.  Not taken consistently.  3.  Overweight -Will continue Ozempic which should also help with  weight loss -gained 3 lbs since last OV  Philemon Kingdom, MD PhD [facility] Endocr[facility]

## 2020-12-18 ENCOUNTER — Ambulatory Visit: Payer: 59 | Admitting: Neurology

## 2020-12-26 ENCOUNTER — Other Ambulatory Visit: Payer: Self-pay | Admitting: Internal Medicine

## 2020-12-26 DIAGNOSIS — Z1231 Encounter for screening mammogram for malignant neoplasm of breast: Secondary | ICD-10-CM

## 2020-12-29 ENCOUNTER — Other Ambulatory Visit (HOSPITAL_COMMUNITY): Payer: Self-pay

## 2020-12-29 MED FILL — Ondansetron HCl Tab 4 MG: ORAL | 6 days supply | Qty: 18 | Fill #1 | Status: AC

## 2020-12-29 MED FILL — Semaglutide Soln Pen-inj 0.25 or 0.5 MG/DOSE (2 MG/1.5ML): SUBCUTANEOUS | 84 days supply | Qty: 4.5 | Fill #0 | Status: AC

## 2020-12-29 MED FILL — Esomeprazole Magnesium Cap Delayed Release 40 MG (Base Eq): ORAL | 30 days supply | Qty: 60 | Fill #0 | Status: AC

## 2020-12-30 ENCOUNTER — Other Ambulatory Visit: Payer: Self-pay

## 2020-12-30 ENCOUNTER — Encounter: Payer: Self-pay | Admitting: Internal Medicine

## 2020-12-30 ENCOUNTER — Ambulatory Visit (INDEPENDENT_AMBULATORY_CARE_PROVIDER_SITE_OTHER): Payer: 59 | Admitting: Internal Medicine

## 2020-12-30 ENCOUNTER — Other Ambulatory Visit (HOSPITAL_COMMUNITY): Payer: Self-pay

## 2020-12-30 VITALS — BP 102/80 | HR 83 | Temp 98.3°F | Ht 60.0 in | Wt 142.0 lb

## 2020-12-30 DIAGNOSIS — E782 Mixed hyperlipidemia: Secondary | ICD-10-CM

## 2020-12-30 DIAGNOSIS — I959 Hypotension, unspecified: Secondary | ICD-10-CM | POA: Diagnosis not present

## 2020-12-30 DIAGNOSIS — Z794 Long term (current) use of insulin: Secondary | ICD-10-CM | POA: Diagnosis not present

## 2020-12-30 DIAGNOSIS — K2970 Gastritis, unspecified, without bleeding: Secondary | ICD-10-CM | POA: Diagnosis not present

## 2020-12-30 DIAGNOSIS — E1165 Type 2 diabetes mellitus with hyperglycemia: Secondary | ICD-10-CM | POA: Diagnosis not present

## 2020-12-30 DIAGNOSIS — K5900 Constipation, unspecified: Secondary | ICD-10-CM

## 2020-12-30 LAB — URINALYSIS, ROUTINE W REFLEX MICROSCOPIC
Bilirubin Urine: NEGATIVE
Hgb urine dipstick: NEGATIVE
Ketones, ur: NEGATIVE
Leukocytes,Ua: NEGATIVE
Nitrite: NEGATIVE
RBC / HPF: NONE SEEN (ref 0–?)
Specific Gravity, Urine: 1.01 (ref 1.000–1.030)
Total Protein, Urine: NEGATIVE
Urine Glucose: NEGATIVE
Urobilinogen, UA: 0.2 (ref 0.0–1.0)
WBC, UA: NONE SEEN (ref 0–?)
pH: 7 (ref 5.0–8.0)

## 2020-12-30 LAB — BASIC METABOLIC PANEL
BUN: 11 mg/dL (ref 6–23)
CO2: 28 mEq/L (ref 19–32)
Calcium: 9.5 mg/dL (ref 8.4–10.5)
Chloride: 101 mEq/L (ref 96–112)
Creatinine, Ser: 0.72 mg/dL (ref 0.40–1.20)
GFR: 93.34 mL/min (ref >60.00)
Glucose, Bld: 110 mg/dL — ABNORMAL HIGH (ref 70–99)
Potassium: 3.9 mEq/L (ref 3.5–5.1)
Sodium: 135 mEq/L (ref 135–145)

## 2020-12-30 LAB — HEPATIC FUNCTION PANEL
ALT: 12 U/L (ref 0–35)
AST: 17 U/L (ref 0–37)
Albumin: 4.4 g/dL (ref 3.5–5.2)
Alkaline Phosphatase: 61 U/L (ref 39–117)
Bilirubin, Direct: 0.1 mg/dL (ref 0.0–0.3)
Total Bilirubin: 0.5 mg/dL (ref 0.2–1.2)
Total Protein: 7.3 g/dL (ref 6.0–8.3)

## 2020-12-30 LAB — CBC WITH DIFFERENTIAL/PLATELET
Basophils Absolute: 0.1 10*3/uL (ref 0.0–0.1)
Basophils Relative: 0.7 % (ref 0.0–3.0)
Eosinophils Absolute: 0.1 10*3/uL (ref 0.0–0.7)
Eosinophils Relative: 0.9 % (ref 0.0–5.0)
HCT: 35.3 % — ABNORMAL LOW (ref 36.0–46.0)
Hemoglobin: 11.6 g/dL — ABNORMAL LOW (ref 12.0–15.0)
Lymphocytes Relative: 55.5 % — ABNORMAL HIGH (ref 12.0–46.0)
Lymphs Abs: 4.5 10*3/uL — ABNORMAL HIGH (ref 0.7–4.0)
MCHC: 33 g/dL (ref 30.0–36.0)
MCV: 81.2 fl (ref 78.0–100.0)
Monocytes Absolute: 0.7 10*3/uL (ref 0.1–1.0)
Monocytes Relative: 8.3 % (ref 3.0–12.0)
Neutro Abs: 2.8 10*3/uL (ref 1.4–7.7)
Neutrophils Relative %: 34.6 % — ABNORMAL LOW (ref 43.0–77.0)
Platelets: 410 10*3/uL — ABNORMAL HIGH (ref 150.0–400.0)
RBC: 4.35 Mil/uL (ref 3.87–5.11)
RDW: 14 % (ref 11.5–15.5)
WBC: 8.2 10*3/uL (ref 4.0–10.5)

## 2020-12-30 LAB — HEMOGLOBIN A1C: Hgb A1c MFr Bld: 8.3 % — ABNORMAL HIGH (ref 4.6–6.5)

## 2020-12-30 LAB — LIPASE: Lipase: 39 U/L (ref 11.0–59.0)

## 2020-12-30 MED ORDER — FAMOTIDINE 20 MG PO TABS
20.0000 mg | ORAL_TABLET | Freq: Two times a day (BID) | ORAL | 5 refills | Status: DC
  Filled 2020-12-30: qty 60, 30d supply, fill #0
  Filled 2021-02-17: qty 60, 30d supply, fill #1

## 2020-12-30 MED ORDER — PROMETHAZINE HCL 25 MG PO TABS
25.0000 mg | ORAL_TABLET | Freq: Four times a day (QID) | ORAL | 5 refills | Status: DC | PRN
  Filled 2020-12-30: qty 20, 5d supply, fill #0
  Filled 2021-01-21: qty 20, 5d supply, fill #1

## 2020-12-30 NOTE — Assessment & Plan Note (Signed)
Mild worsening recently I suspect, for contd linzess rprn,  to f/u any worsening symptoms or concerns

## 2020-12-30 NOTE — Assessment & Plan Note (Signed)
midl to mod, for add pepcid 20 bid, phenergan prn, declines GI referral for now, for cbc with labs

## 2020-12-30 NOTE — Patient Instructions (Signed)
Please take all new medication as prescribed - the pepcid twice per day  Ok to stop the amlodipine and the tenormin for now  Please continue all other medications as before, including the linzess as needed, and phenergan  Please have the pharmacy call with any other refills you may need.  Please continue your efforts at being more active, low cholesterol diet, and weight control.  Please keep your appointments with your specialists as you may have planned  Please go to the LAB at the blood drawing area for the tests to be done  You will be contacted by phone if any changes need to be made immediately.  Otherwise, you will receive a letter about your results with an explanation, but please check with MyChart first.  Please remember to sign up for MyChart if you have not done so, as this will be important to you in the future with finding out test results, communicating by private email, and scheduling acute appointments online when needed.

## 2020-12-30 NOTE — Assessment & Plan Note (Addendum)
Lab Results  Component Value Date   HGBA1C 8.3 (H) 12/30/2020   Uncontrolled, pt to continue current medical treatmen lantus, novolog, and f/u endo

## 2020-12-30 NOTE — Assessment & Plan Note (Signed)
Appears overtreated, to d/c amlodipine and tenormin, continue to follow BP athome and next visit

## 2020-12-30 NOTE — Assessment & Plan Note (Signed)
Lab Results  Component Value Date   LDLCALC 151 (H) 08/28/2020   Has been lipitor intolerant,, pt to continue current low chol DM diet, declines statin for now

## 2020-12-30 NOTE — Progress Notes (Signed)
Patient ID: Rachel [name], female   DOB: 08/13/1965, 55 y.o.   MRN: [id number]        Chief Complaint: follow up HTN with recent low BP and falls x 2       HPI:  Rachel [name] is a 55 y.o. female here with c/o fall x 2 with laceration to bridge of nose requiring stitch, and left knee contusion both new resolved, but lower BP persist with fatigue and generalized weakness.  Also has recent onset 3-4 days epigastric discomfort with several episodes n/v, trying to get by with soft food and gatorade, without fever, chills, blood, and no sick contacts,  CBGs have been low 100s.  Has had constipation but does not think the cause.  Has had vertigo mild intermittent recently as well but does not think related.  May be somewhat improved after not taking the amlodipine the last 3 days.  Has been taking nexium bid.  Denies worsening dysphagia.  Phenergan helps the nausea.         Wt Readings from Last 3 Encounters:  12/30/20 142 lb (64.4 kg)  09/25/20 145 lb (65.8 kg)  09/16/20 143 lb 6.4 oz (65 kg)   BP Readings from Last 3 Encounters:  12/30/20 102/80  09/25/20 108/60  09/16/20 118/78         Past Medical History:  Diagnosis Date   ALLERGIC RHINITIS 10/04/2007   Anemia 01/21/2011   Anxiety 11/15/2018   ASTHMA 08/02/2007   INHALERS ONLY IN [place]   Asthma 08/02/2007   Qualifier: Diagnosis of  By: Wynona Luna    Blood transfusion without reported diagnosis    with first child    Cervical radiculopathy 01/21/2011   Cervicogenic headache 04/27/2017   COMMON MIGRAINE 05/15/2009   Depression 01/09/2010   Qualifier: Diagnosis of  By: Jenny Reichmann MD, [place], TYPE II 08/02/2007   Dysphagia, pharyngoesophageal phase 06/12/2014   GERD 08/02/2007   HYPERLIPIDEMIA 08/02/2007   Hyperlipidemia 08/02/2007   Qualifier: Diagnosis of  By: Wynona Luna    INSOMNIA-SLEEP [place] 01/04/2008   INTERMITTENT VERTIGO 05/15/2009   LIBIDO, DECREASED 01/09/2010   Migraine without aura  05/15/2009   Qualifier: Diagnosis of  By: Jenny Reichmann MD, [place]    Nonallopathic lesion of rib cage 12/21/2017   Nonallopathic lesion of sacral region 07/29/2015   Nonallopathic lesion of thoracic region 07/29/2015   Osteoporosis 04/25/2019   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   Rheumatoid factor positive 02/10/2016   Right knee pain 01/31/2019   Injected January 31, 2019   Type 2 diabetes mellitus with hyperglycemia, with long-term current use of insulin ([place]) 08/02/2007   Qualifier: Diagnosis of  By: Wynona Luna    Past Surgical History:  Procedure Laterality Date   CESAREAN SECTION     x 3   CYST EXCISION     left knee   PAROTIDECTOMY  10/21/2011   Procedure: PAROTIDECTOMY;  Surgeon: Melida Quitter, MD;  Location: [facility];  Service: ENT;  Laterality: Left;    reports that she has never smoked. She has never used smokeless tobacco. She reports that she does not drink alcohol and does not use drugs. family history includes Asthma in her father; Cervical cancer in her maternal aunt; Diabetes in her father; Heart disease in her maternal aunt; Hypertension in her father. Allergies  Allergen Reactions   Lipitor [Atorvastatin]     REACTION: myalgias   Nitroglycerin  Current Outpatient Medications on File Prior to Visit  Medication Sig Dispense Refill   albuterol (VENTOLIN HFA) 108 (90 Base) MCG/ACT inhaler INHALE 2 PUFFS INTO THE LUNGS EVERY 6 (SIX) HOURS AS NEEDED FOR WHEEZING OR SHORTNESS OF BREATH. 18 g 5   aspirin 81 MG EC tablet Take 81 mg by mouth daily.     butalbital-acetaminophen-caffeine (FIORICET) 50-325-40 MG tablet Take 1 tablet by mouth every 6 (six) hours as needed for headache. Max 2 tablets per day 30 tablet 3   Cholecalciferol (VITAMIN D) 50 MCG (2000 UT) CAPS Take 4,000 Units by mouth daily.     cyclobenzaprine (FLEXERIL) 5 MG tablet TAKE 1 TABLET BY MOUTH THREE TIMES DAILY AS NEEDED FOR MUSCLE SPASMS. 90 tablet 1   Diclofenac Sodium (PENNSAID) 2 % SOLN Place 2  g onto the skin 2 (two) times daily. 112 g 3   DULoxetine (CYMBALTA) 60 MG capsule Take 60 mg by mouth daily.     esomeprazole (NEXIUM) 40 MG capsule TAKE 1 CAPSULE BY MOUTH 2 TIMES DAILY BEFORE A MEAL. 180 capsule 3   Fremanezumab-vfrm (AJOVY) 225 MG/1.5ML SOAJ Inject 225 mg into the skin every 28 (twenty-eight) days. 1.5 mL 2   gabapentin (NEURONTIN) 300 MG capsule TAKE 2 CAPSULES BY MOUTH 2 TIMES DAILY 120 capsule 2   glucose blood (ACCU-CHEK GUIDE) test strip Use as instructed 2x a day 200 each 12   insulin glargine (LANTUS) 100 UNIT/ML Solostar Pen Inject under skin 28 units daily 30 mL 1   Insulin Pen Needle 32G X 4 MM MISC USE AS DIRECTED. 200 each 3   Insulin Regular Human (NOVOLIN R FLEXPEN) 100 UNIT/ML SOPN inject 36 units daily     Lancets (FREESTYLE) lancets USE 1 FOUR TIMES DAILY AS DIRECTED. (Patient taking differently: USE 1 FOUR TIMES DAILY AS DIRECTED.) 400 each 12   Lasmiditan Succinate (REYVOW) 100 MG TABS Take 100 mg by mouth as needed. Take 1 tablet for headache. No more than 1 tablet in 24 hours 8 tablet 3   linaclotide (LINZESS) 145 MCG CAPS capsule TAKE 1 CAPSULE (145 MCG TOTAL) BY MOUTH DAILY BEFORE BREAKFAST. 30 capsule 2   meclizine (ANTIVERT) 12.5 MG tablet Take 1 tablet (12.5 mg total) by mouth 3 (three) times daily as needed for dizziness. 40 tablet 1   meloxicam (MOBIC) 15 MG tablet TAKE 1 TABLET (15 MG TOTAL) BY MOUTH DAILY AS NEEDED FOR PAIN. 30 tablet 1   metoCLOPramide (REGLAN) 5 MG tablet Take 1 tablet (5 mg total) by mouth every 8 (eight) hours as needed for nausea or vomiting. 40 tablet 1   Multiple Vitamin (MULTIVITAMIN WITH MINERALS) TABS tablet Take 1 tablet by mouth daily.     nystatin-triamcinolone ointment (MYCOLOG) APPLY 1 APPLICATION TOPICALLY 2 (TWO) TIMES DAILY. 30 g 2   ondansetron (ZOFRAN ODT) 8 MG disintegrating tablet Take 1 tablet (8 mg total) by mouth every 8 (eight) hours as needed for nausea or vomiting. 30 tablet 0   ondansetron (ZOFRAN) 4 MG  tablet TAKE 1 TABLET BY MOUTH EVERY 8 HOURS AS NEEDED FOR NAUSEA OR VOMITING. 40 tablet 1   Semaglutide,0.25 or 0.5MG /DOS, 2 MG/1.5ML SOPN INJECT 0.5 MG INTO THE SKIN ONCE A WEEK. 4.5 mL 3   triamcinolone (NASACORT) 55 MCG/ACT AERO nasal inhaler Place 2 sprays into the nose daily. 1 Inhaler 12   No current facility-administered medications on file prior to visit.        ROS:  All others reviewed and negative.  Objective        PE:  BP 102/80 (BP Location: Left Arm, Patient Position: Sitting, Cuff Size: Normal)   Pulse 83   Temp 98.3 F (36.8 C) (Oral)   Ht 5' (1.524 m)   Wt 142 lb (64.4 kg)   SpO2 97%   BMI 27.73 kg/m                 Constitutional: Pt appears in NAD               HENT: Head: NCAT.                Right Ear: External ear normal.                 Left Ear: External ear normal.                Eyes: . Pupils are equal, round, and reactive to light. Conjunctivae and EOM are normal               Nose: without d/c or deformity               Neck: Neck supple. Gross normal ROM               Cardiovascular: Normal rate and regular rhythm.                 Pulmonary/Chest: Effort normal and breath sounds without rales or wheezing.                Abd:  Soft, NT, ND, + BS, no organomegaly               Neurological: Pt is alert. At baseline orientation, motor grossly intact               Skin: Skin is warm. No rashes, no other new lesions, LE edema - none               Psychiatric: Pt behavior is normal without agitation   Micro: none  Cardiac tracings I have personally interpreted today:  none  Pertinent Radiological findings (summarize): none   Lab Results  Component Value Date   WBC 8.2 12/30/2020   HGB 11.6 (L) 12/30/2020   HCT 35.3 (L) 12/30/2020   PLT 410.0 (H) 12/30/2020   GLUCOSE 110 (H) 12/30/2020   CHOL 229 (H) 08/28/2020   TRIG 103.0 08/28/2020   HDL 57.10 08/28/2020   LDLDIRECT 191.0 08/23/2018   LDLCALC 151 (H) 08/28/2020   ALT 12 12/30/2020   AST  17 12/30/2020   NA 135 12/30/2020   K 3.9 12/30/2020   CL 101 12/30/2020   CREATININE 0.72 12/30/2020   BUN 11 12/30/2020   CO2 28 12/30/2020   TSH 1.07 08/28/2020   INR 1.08 07/23/2009   HGBA1C 8.3 (H) 12/30/2020   MICROALBUR <0.7 08/28/2020   Assessment/Plan:  Laurella Tull is a 55 y.o. Other or two or more races [6] female with  has a past medical history of ALLERGIC RHINITIS (10/04/2007), Anemia (01/21/2011), Anxiety (11/15/2018), ASTHMA (08/02/2007), Asthma (08/02/2007), Blood transfusion without reported diagnosis, Cervical radiculopathy (01/21/2011), Cervicogenic headache (04/27/2017), COMMON MIGRAINE (05/15/2009), Depression (01/09/2010), DIABETES MELLITUS, TYPE II (08/02/2007), Dysphagia, pharyngoesophageal phase (06/12/2014), GERD (08/02/2007), HYPERLIPIDEMIA (08/02/2007), Hyperlipidemia (08/02/2007), INSOMNIA-SLEEP DISORDER-UNSPEC (01/04/2008), INTERMITTENT VERTIGO (05/15/2009), LIBIDO, DECREASED (01/09/2010), Migraine without aura (05/15/2009), Nonallopathic lesion of rib cage (12/21/2017), Nonallopathic lesion of sacral region (07/29/2015), Nonallopathic lesion of thoracic region (07/29/2015), Osteoporosis (04/25/2019), Patellofemoral syndrome of both knees (  04/25/2019), Rheumatoid factor positive (02/10/2016), Right knee pain (01/31/2019), and Type 2 diabetes mellitus with hyperglycemia, with long-term current use of insulin ([place]) (08/02/2007).  Type 2 diabetes mellitus with hyperglycemia, with long-term current use of insulin ([facility]) Lab Results  Component Value Date   HGBA1C 8.3 (H) 12/30/2020   Uncontrolled, pt to continue current medical treatmen lantus, novolog, and f/u endo   Hypotension Appears overtreated, to d/c amlodipine and tenormin, continue to follow BP athome and next visit  Hyperlipidemia Lab Results  Component Value Date   [place] 151 (H) 08/28/2020   Has been lipitor intolerant,, pt to continue current low chol DM diet, declines statin for now   Gastritis midl to mod, for  add pepcid 20 bid, phenergan prn, declines GI referral for now, for cbc with labs  Constipation Mild worsening recently I suspect, for contd linzess rprn,  to f/u any worsening symptoms or concerns  Followup: Return if symptoms worsen or fail to improve.  Cathlean Cower, MD 12/30/2020 10:19 PM [facility]

## 2021-01-01 ENCOUNTER — Other Ambulatory Visit: Payer: Self-pay

## 2021-01-01 ENCOUNTER — Ambulatory Visit (INDEPENDENT_AMBULATORY_CARE_PROVIDER_SITE_OTHER): Payer: 59 | Admitting: Neurology

## 2021-01-01 ENCOUNTER — Ambulatory Visit (INDEPENDENT_AMBULATORY_CARE_PROVIDER_SITE_OTHER): Payer: 59 | Admitting: Family Medicine

## 2021-01-01 ENCOUNTER — Encounter: Payer: Self-pay | Admitting: Neurology

## 2021-01-01 ENCOUNTER — Other Ambulatory Visit (HOSPITAL_COMMUNITY): Payer: Self-pay

## 2021-01-01 ENCOUNTER — Encounter: Payer: Self-pay | Admitting: Family Medicine

## 2021-01-01 VITALS — BP 110/82 | HR 95 | Ht 60.0 in | Wt 142.0 lb

## 2021-01-01 VITALS — BP 115/80 | HR 87 | Ht 60.0 in | Wt 142.0 lb

## 2021-01-01 DIAGNOSIS — K219 Gastro-esophageal reflux disease without esophagitis: Secondary | ICD-10-CM | POA: Diagnosis not present

## 2021-01-01 DIAGNOSIS — G43009 Migraine without aura, not intractable, without status migrainosus: Secondary | ICD-10-CM

## 2021-01-01 DIAGNOSIS — M9903 Segmental and somatic dysfunction of lumbar region: Secondary | ICD-10-CM

## 2021-01-01 DIAGNOSIS — M999 Biomechanical lesion, unspecified: Secondary | ICD-10-CM | POA: Diagnosis not present

## 2021-01-01 DIAGNOSIS — R296 Repeated falls: Secondary | ICD-10-CM | POA: Diagnosis not present

## 2021-01-01 DIAGNOSIS — M9901 Segmental and somatic dysfunction of cervical region: Secondary | ICD-10-CM

## 2021-01-01 DIAGNOSIS — M4802 Spinal stenosis, cervical region: Secondary | ICD-10-CM | POA: Diagnosis not present

## 2021-01-01 DIAGNOSIS — M9908 Segmental and somatic dysfunction of rib cage: Secondary | ICD-10-CM

## 2021-01-01 DIAGNOSIS — M9904 Segmental and somatic dysfunction of sacral region: Secondary | ICD-10-CM | POA: Diagnosis not present

## 2021-01-01 DIAGNOSIS — M9902 Segmental and somatic dysfunction of thoracic region: Secondary | ICD-10-CM

## 2021-01-01 MED ORDER — AJOVY 225 MG/1.5ML ~~LOC~~ SOAJ
225.0000 mg | SUBCUTANEOUS | 5 refills | Status: DC
  Filled 2021-01-01 – 2021-01-21 (×2): qty 1.5, 30d supply, fill #0
  Filled 2021-02-18 – 2021-03-25 (×6): qty 1.5, 30d supply, fill #1

## 2021-01-01 MED ORDER — GABAPENTIN 300 MG PO CAPS
600.0000 mg | ORAL_CAPSULE | Freq: Two times a day (BID) | ORAL | 5 refills | Status: DC
  Filled 2021-01-01: qty 120, 30d supply, fill #0
  Filled 2021-02-17: qty 120, 30d supply, fill #1
  Filled 2021-03-18: qty 120, 30d supply, fill #2
  Filled 2021-05-22: qty 120, 30d supply, fill #3
  Filled 2021-07-27: qty 120, 30d supply, fill #4
  Filled 2021-10-12: qty 120, 30d supply, fill #5

## 2021-01-01 MED ORDER — METHYLPREDNISOLONE ACETATE 40 MG/ML IJ SUSP
40.0000 mg | Freq: Once | INTRAMUSCULAR | Status: AC
  Administered 2021-01-01: 40 mg via INTRAMUSCULAR

## 2021-01-01 MED ORDER — KETOROLAC TROMETHAMINE 30 MG/ML IJ SOLN
30.0000 mg | Freq: Once | INTRAMUSCULAR | Status: AC
  Administered 2021-01-01: 30 mg via INTRAMUSCULAR

## 2021-01-01 NOTE — Assessment & Plan Note (Signed)
Patient is responding fairly well though to osteopathic manipulation at this moment.  Patient is endorsing recent falls.  Has had a history of hypertension previously as well as orthostatics.  Patient unknown baseline history of potential early dumping syndrome with patient feeling fatigued and lethargic after eating.  Patient is also associated with nausea and a couple episodes of vomiting afterwards.  Will refer to gastroenterology.  Did respond well to manipulation again of the neck.  Toradol and Depo-Medrol given today, will hold on any other type of medication changes.  Follow-up with me again 4 to 6 weeks

## 2021-01-01 NOTE — Assessment & Plan Note (Signed)
Patient does have signs and symptoms of worsening gastroesophageal reflux disease and does have a history of gastritis.  Patient has done a gastric emptying study previously that was fairly markable.  Patient has been had worsening reflux at this point and discussed referral to endocrinology to further evaluate.  Patient states that she does have a feeling of weakness and almost feels like needing to take a nap after eating regularly and wondering if this is contributing to some of the following.

## 2021-01-01 NOTE — Progress Notes (Signed)
NEUROLOGY FOLLOW UP OFFICE NOTE  Luciel Brickman [id number]  Assessment/Plan:   1  Migraine without Aura, Without Status Migrainosus, Not Intractable 2  Frequent falls.  I have no neurologic explanation.  She has had a thorough workup over past 9 years.  She does not exhibit signs of neurodegenerative disease such as Parkinson's. Lewy Body Dementia or Progressive Supranuclear palsy.  MRI of brain has not demonstrated possibly NPH or secondary to cerebrovascular disease.  She does not have cervical spinal stenosis causing myelopathy.  She does not have evidence of peripheral neuropathy.  She does not have lower extremity weakness or pain to suggest lumbar stenosis.  EEG negative for seizures.  She does not endorse associated dizziness.  Recommend evaluation for other potential causes.   For preventative management, Ajovy Q4wks, gabapentin 600mg  BID  For abortive therapy, Excedrin Migraine, urged her to continue using Fioricet sparingly  Limit use of pain relievers to no more than 2 days out of week to prevent risk of rebound or medication-overuse headache.  Keep headache diary  Exercise, hydration, caffeine cessation, sleep hygiene, monitor for and avoid triggers  Consider:  magnesium citrate 400mg  daily, riboflavin 400mg  daily, and coenzyme Q10 100mg  three times daily Always keep in mind that currently taking a hormone or birth control may be a possible trigger or aggravating factor for migraine. Follow up 6 months.  Subjective:  Erionna Strum is a 55 year old right-handed female with type 2 diabetes, hyperlipidemia, GERD, asthma, and migraine who follows up for migraines.   UPDATE: In January, gabapentin was increased.  Migraines improved, averaging 5 to 6 a month.  [place] injection.  Headaches increased.  Will be picking up today.    She has had two recent falls.  Again,she says she just falls.  One time was preceded by feeling weak with nausea  and vomiting, but that is not typical.     Current NSAIDS: none Current analgesics: Excedrin Migraine, Fioricet (sometimes) Current triptans: None Current ergotamine: None Current anti-emetic: promethazine, Reglan Current muscle relaxants: none Current anti-anxiolytic: None Current sleep aide: None Current Antihypertensive medications: None Current Antidepressant medications: Cymbalta 60mg  daily Current Anticonvulsant medications: Gabapentin 600 mg twice daily (for back pain) Current anti-CGRP: Melodie Bouillon 100mg  Current Vitamins/Herbal/Supplements: Turmeric Current Antihistamines/Decongestants: mecllzine, Allegra   Caffeine: One cup of coffee daily Diet: Drinks plenty of water.  No soda. Exercise: No Depression: No; Anxiety: yes.  Her mother has been ill.  She has been confused. Other pain: Knee pain. Sleep hygiene: Poor.  Worrying about her mother.   HISTORY: Onset:  2011.  She does have remote history of headaches when she was younger. Location:  Back of head and radiates to the front Quality:  Shooting up from back of head, pounding on top and front of head Initial Intensity:  8/10 Aura:  no Prodrome:  no Associated symptoms:  Nausea, photophobia, phonophobia, blurred vision (she gets annual eye exams due to diabetes) Initial Duration:  All day Initial Frequency:  Almost daily (cannot function 6 days per month) Triggers/exacerbating factors:  wine Relieving factors:  No Activity:  Cannot work about 6 days per month (she works as an Advertising account planner)   Past NSAIDS: Cambia (made headache worse), flurbiprofen, Sprix nasal spray, ibuprofen; naproxen Past analgesics: Tramadol, Excedrin, Tylenol Past abortive triptans: Sumatriptan 100 mg, Zembrace-SymTouch (did not like the feeling), Zomig 5 mg NS (did not like the way it made her feel), Maxalt (dizziness, ineffective)  Past abortive ergotamine: Cafergot Past muscle relaxants: Robaxin, Flexeril Past anti-emetic:  Zofran 8mg , Reglan, Phenergan Past antihypertensive medications: Atenolol Past antidepressant medications: Venlafaxine XR 150 mg; nortriptyline Past anticonvulsant medications: Topiramate, Depakote Past anti-CGRP: Aimovig 140mg , Emgality, Nurtec (dizziness) Past vitamins/Herbal/Supplements: None Past antihistamines/decongestants: None Other past therapies: Botox   Frequent Falls: For almost 10 years, she has had falls, but they have become more frequent over the past year.  Previously, she was diagnosed with vertigo.  However, she reports that she doesn't note spinning or lightheadedness.  She says she "just falls".  On one occasion, she was pushing the seat forward while sitting and just fell over.  Otherwise, it always occurs while walking or on her feet.  She denies double vision or focal numbness or weakness.  MRI of the brain with seizure protocol was performed on 08/02/11, which was unremarkable.  Sleep deprived EEG was normal.  She has had PT, which was ineffective.  She does report that she sometimes trips while walking but doesn't fall.   Cervical plain films from 05/07/15 showed mild degenerative disc disease at C5-6, but otherwise unremarkable.  Lumbar films from 06/10/15 were personally reviewed and were negative.  She underwent anothner MRI of brain with and without contrast on 02/10/16, and was stable compared to prior MRI from 2013.  NCV-EMG from 05/13/16 was negative for neuropathy or lumbosacral radiculopathy.  MRI cervical spine on 03/31/2019 showed degenerative spondylosis but no significant spinal stenosis or cord compression. Denies diplopia or visual hallucinations.  PAST MEDICAL HISTORY: Past Medical History:  Diagnosis Date   ALLERGIC RHINITIS 10/04/2007   Anemia 01/21/2011   Anxiety 11/15/2018   ASTHMA 08/02/2007   INHALERS ONLY IN [place]   Asthma 08/02/2007   Qualifier: Diagnosis of  By: Wynona Luna    Blood transfusion without reported diagnosis    with first child     Cervical radiculopathy 01/21/2011   Cervicogenic headache 04/27/2017   COMMON MIGRAINE 05/15/2009   Depression 01/09/2010   Qualifier: Diagnosis of  By: Jenny Reichmann MD, [place], TYPE II 08/02/2007   Dysphagia, pharyngoesophageal phase 06/12/2014   GERD 08/02/2007   HYPERLIPIDEMIA 08/02/2007   Hyperlipidemia 08/02/2007   Qualifier: Diagnosis of  By: Wynona Luna    INSOMNIA-SLEEP DISORDER-UNSPEC 01/04/2008   INTERMITTENT VERTIGO 05/15/2009   LIBIDO, DECREASED 01/09/2010   Migraine without aura 05/15/2009   Qualifier: Diagnosis of  By: Jenny Reichmann MD, [place]    Nonallopathic lesion of rib cage 12/21/2017   Nonallopathic lesion of sacral region 07/29/2015   Nonallopathic lesion of thoracic region 07/29/2015   Osteoporosis 04/25/2019   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   Rheumatoid factor positive 02/10/2016   Right knee pain 01/31/2019   Injected January 31, 2019   Type 2 diabetes mellitus with hyperglycemia, with long-term current use of insulin ([place]) 08/02/2007   Qualifier: Diagnosis of  By: Wynona Luna     MEDICATIONS: Current Outpatient Medications on File Prior to Visit  Medication Sig Dispense Refill   albuterol (VENTOLIN HFA) 108 (90 Base) MCG/ACT inhaler INHALE 2 PUFFS INTO THE LUNGS EVERY 6 (SIX) HOURS AS NEEDED FOR WHEEZING OR SHORTNESS OF BREATH. 18 g 5   aspirin 81 MG EC tablet Take 81 mg by mouth daily.     butalbital-acetaminophen-caffeine (FIORICET) 50-325-40 MG tablet Take 1 tablet by mouth every 6 (six) hours as needed for headache. Max 2 tablets per day 30 tablet 3   Cholecalciferol (VITAMIN  D) 50 MCG (2000 UT) CAPS Take 4,000 Units by mouth daily.     cyclobenzaprine (FLEXERIL) 5 MG tablet TAKE 1 TABLET BY MOUTH THREE TIMES DAILY AS NEEDED FOR MUSCLE SPASMS. 90 tablet 1   Diclofenac Sodium (PENNSAID) 2 % SOLN Place 2 g onto the skin 2 (two) times daily. 112 g 3   DULoxetine (CYMBALTA) 60 MG capsule Take 60 mg by mouth daily.     esomeprazole  (NEXIUM) 40 MG capsule TAKE 1 CAPSULE BY MOUTH 2 TIMES DAILY BEFORE A MEAL. 180 capsule 3   famotidine (PEPCID) 20 MG tablet Take 1 tablet (20 mg total) by mouth 2 (two) times daily. 60 tablet 5   Fremanezumab-vfrm (AJOVY) 225 MG/1.5ML SOAJ Inject 225 mg into the skin every 28 (twenty-eight) days. 1.5 mL 2   gabapentin (NEURONTIN) 300 MG capsule TAKE 2 CAPSULES BY MOUTH 2 TIMES DAILY 120 capsule 2   glucose blood (ACCU-CHEK GUIDE) test strip Use as instructed 2x a day 200 each 12   insulin glargine (LANTUS) 100 UNIT/ML Solostar Pen Inject under skin 28 units daily 30 mL 1   Insulin Pen Needle 32G X 4 MM MISC USE AS DIRECTED. 200 each 3   Insulin Regular Human (NOVOLIN R FLEXPEN) 100 UNIT/ML SOPN inject 36 units daily     Lancets (FREESTYLE) lancets USE 1 FOUR TIMES DAILY AS DIRECTED. (Patient taking differently: USE 1 FOUR TIMES DAILY AS DIRECTED.) 400 each 12   Lasmiditan Succinate (REYVOW) 100 MG TABS Take 100 mg by mouth as needed. Take 1 tablet for headache. No more than 1 tablet in 24 hours 8 tablet 3   linaclotide (LINZESS) 145 MCG CAPS capsule TAKE 1 CAPSULE (145 MCG TOTAL) BY MOUTH DAILY BEFORE BREAKFAST. 30 capsule 2   meclizine (ANTIVERT) 12.5 MG tablet Take 1 tablet (12.5 mg total) by mouth 3 (three) times daily as needed for dizziness. 40 tablet 1   meloxicam (MOBIC) 15 MG tablet TAKE 1 TABLET (15 MG TOTAL) BY MOUTH DAILY AS NEEDED FOR PAIN. 30 tablet 1   metoCLOPramide (REGLAN) 5 MG tablet Take 1 tablet (5 mg total) by mouth every 8 (eight) hours as needed for nausea or vomiting. 40 tablet 1   Multiple Vitamin (MULTIVITAMIN WITH MINERALS) TABS tablet Take 1 tablet by mouth daily.     nystatin-triamcinolone ointment (MYCOLOG) APPLY 1 APPLICATION TOPICALLY 2 (TWO) TIMES DAILY. 30 g 2   ondansetron (ZOFRAN ODT) 8 MG disintegrating tablet Take 1 tablet (8 mg total) by mouth every 8 (eight) hours as needed for nausea or vomiting. 30 tablet 0   ondansetron (ZOFRAN) 4 MG tablet TAKE 1 TABLET  BY MOUTH EVERY 8 HOURS AS NEEDED FOR NAUSEA OR VOMITING. 40 tablet 1   promethazine (PHENERGAN) 25 MG tablet Take 1 tablet (25 mg total) by mouth every 6 (six) hours as needed for nausea 20 tablet 5   Semaglutide,0.25 or 0.5MG /DOS, 2 MG/1.5ML SOPN INJECT 0.5 MG INTO THE SKIN ONCE A WEEK. 4.5 mL 3   triamcinolone (NASACORT) 55 MCG/ACT AERO nasal inhaler Place 2 sprays into the nose daily. 1 Inhaler 12   No current facility-administered medications on file prior to visit.    ALLERGIES: Allergies  Allergen Reactions   Lipitor [Atorvastatin]     REACTION: myalgias   Nitroglycerin     FAMILY HISTORY: Family History  Problem Relation Age of Onset   Hypertension Father    Diabetes Father    Asthma Father    Cervical cancer Maternal Aunt  Heart disease Maternal Aunt    Anesthesia problems Neg Hx    Colon cancer Neg Hx    Breast cancer Neg Hx       Objective:  Blood pressure 115/80, pulse 87, height 5' (1.524 m), weight 142 lb (64.4 kg), SpO2 98 %. General: No acute distress.  Patient appears well-groomed.   Head:  Normocephalic/atraumatic Eyes:  Fundi examined but not visualized Neck: supple, no paraspinal tenderness, full range of motion Heart:  Regular rate and rhythm Lungs:  Clear to auscultation bilaterally Back: No paraspinal tenderness Neurological Exam: alert and oriented to person, place, and time.  Speech fluent and not dysarthric, language intact.  CN II-XII intact. Bulk and tone normal, muscle strength 5/5 throughout.  Sensation to temperature and vibration intact.  Deep tendon reflexes 2+ throughout, toes downgoing.  Finger to nose testing intact.  Gait with slight veering but no ataxia.  Able to turn and tandem walk.  Romberg negative.   Metta Clines, DO  CC: Cathlean Cower, MD

## 2021-01-01 NOTE — Patient Instructions (Signed)
Ajovy and gabapentin refilled with 3 additional refills

## 2021-01-01 NOTE — Patient Instructions (Addendum)
Good to see you Hopefully this will calm everything down Referral sent to GI See me again in 4-6 weeks

## 2021-01-01 NOTE — Progress Notes (Signed)
[facility] [address] [place] Phone: [phone] Subjective:   I Rachel [name] am serving as a Education administrator for Dr. Hulan Saas.  This visit occurred during the SARS-CoV-2 public health emergency.  Safety protocols were in place, including screening questions prior to the visit, additional usage of staff PPE, and extensive cleaning of exam room while observing appropriate contact time as indicated for disinfecting solutions.   I'm seeing this patient by the request  of:  Biagio Borg, MD  CC: Neck back and knee pain  AJG:[id number]  09/25/2020 Patient has responded relatively well to home exercises, icing regimen, patient is responding well to manipulation.  Discussed with patient about icing regimen discussed patient's medications again in greater length.  Chronic, with mild exacerbation.  Patient will be traveling for the next 2 months and we will see her when she gets back.  Bilateral injections given today, tolerated procedure well, discussed icing regimen and home exercises, encourage patient to continue to stay active.  Discussed posture and ergonomics, discussed with patient about icing regimen patient's x-rays and previous work-up has been fairly unremarkable.  Follow-up again in 6 weeks  01/01/2021 Rachel [name] is a 55 y.o. female coming in with complaint of neck, back and knees pain. Patient fell recently. Face cuts. States she also fell on the left knee. Saw neurologist this morning.  Read Dr. Georgie Chard note.  We will provide that this is likely not neurologic and may need further work-up.  Patient denies any shortness of breath, any chest pain.  Only thing different that she notices that she is having more difficulty with eating.  States that every time she eats she feels very fatigued and does have worsening reflux disease.  Has not noticed though that patient has had these episodes of falls with this.  Denies that she ever is truly  blacking out.  States that everything is painful.       Past Medical History:  Diagnosis Date   ALLERGIC RHINITIS 10/04/2007   Anemia 01/21/2011   Anxiety 11/15/2018   ASTHMA 08/02/2007   INHALERS ONLY IN [place]   Asthma 08/02/2007   Qualifier: Diagnosis of  By: Wynona Luna    Blood transfusion without reported diagnosis    with first child    Cervical radiculopathy 01/21/2011   Cervicogenic headache 04/27/2017   COMMON MIGRAINE 05/15/2009   Depression 01/09/2010   Qualifier: Diagnosis of  By: Jenny Reichmann MD, [place], TYPE II 08/02/2007   Dysphagia, pharyngoesophageal phase 06/12/2014   GERD 08/02/2007   HYPERLIPIDEMIA 08/02/2007   Hyperlipidemia 08/02/2007   Qualifier: Diagnosis of  By: Wynona Luna    INSOMNIA-SLEEP [place] 01/04/2008   INTERMITTENT VERTIGO 05/15/2009   LIBIDO, DECREASED 01/09/2010   Migraine without aura 05/15/2009   Qualifier: Diagnosis of  By: Jenny Reichmann MD, Hunt Oris    Nonallopathic lesion of rib cage 12/21/2017   Nonallopathic lesion of sacral region 07/29/2015   Nonallopathic lesion of thoracic region 07/29/2015   Osteoporosis 04/25/2019   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   Rheumatoid factor positive 02/10/2016   Right knee pain 01/31/2019   Injected January 31, 2019   Type 2 diabetes mellitus with hyperglycemia, with long-term current use of insulin ([place]) 08/02/2007   Qualifier: Diagnosis of  By: Wynona Luna    Past Surgical History:  Procedure Laterality Date   CESAREAN SECTION     x 3  CYST EXCISION     left knee   PAROTIDECTOMY  10/21/2011   Procedure: PAROTIDECTOMY;  Surgeon: Melida Quitter, MD;  Location: [facility] OR;  Service: ENT;  Laterality: Left;   Social History   Socioeconomic History   Marital status: Married    Spouse name: [name]   Number of children: 3   Years of education: Degree   Highest education level: Not on file  Occupational History   Occupation: Research scientist (life sciences): [employer]    Occupation: Chartered certified accountant    Employer: [employer]  Tobacco Use   Smoking status: Never   Smokeless tobacco: Never  Vaping Use   Vaping Use: Never used  Substance and Sexual Activity   Alcohol use: No    Alcohol/week: 0.0 standard drinks   Drug use: No   Sexual activity: Not on file  Other Topics Concern   Not on file  Social History Narrative   She has 3 daughters.   Patient has a 4 year degree.    Patient working at [employer].    Patient is married to [place].   Social Determinants of Health   Financial Resource Strain: Not on file  Food Insecurity: Not on file  Transportation Needs: Not on file  Physical Activity: Not on file  Stress: Not on file  Social Connections: Not on file   Allergies  Allergen Reactions   Lipitor [Atorvastatin]     REACTION: myalgias   Nitroglycerin    Family History  Problem Relation Age of Onset   Hypertension Father    Diabetes Father    Asthma Father    Cervical cancer Maternal Aunt    Heart disease Maternal Aunt    Anesthesia problems Neg Hx    Colon cancer Neg Hx    Breast cancer Neg Hx     Current Outpatient Medications (Endocrine & Metabolic):    insulin glargine (LANTUS) 100 UNIT/ML Solostar Pen, Inject under skin 28 units daily   Insulin Regular Human (NOVOLIN R FLEXPEN) 100 UNIT/ML SOPN, inject 36 units daily   Semaglutide,0.25 or 0.5MG /DOS, 2 MG/1.5ML SOPN, INJECT 0.5 MG INTO THE SKIN ONCE A WEEK.   Current Outpatient Medications (Respiratory):    albuterol (VENTOLIN HFA) 108 (90 Base) MCG/ACT inhaler, INHALE 2 PUFFS INTO THE LUNGS EVERY 6 (SIX) HOURS AS NEEDED FOR WHEEZING OR SHORTNESS OF BREATH.   promethazine (PHENERGAN) 25 MG tablet, Take 1 tablet (25 mg total) by mouth every 6 (six) hours as needed for nausea   triamcinolone (NASACORT) 55 MCG/ACT AERO nasal inhaler, Place 2 sprays into the nose daily.  Current Outpatient Medications (Analgesics):    aspirin 81 MG EC tablet, Take 81 mg by mouth daily.    butalbital-acetaminophen-caffeine (FIORICET) 50-325-40 MG tablet, Take 1 tablet by mouth every 6 (six) hours as needed for headache. Max 2 tablets per day   Fremanezumab-vfrm (AJOVY) 225 MG/1.5ML SOAJ, Inject 225 mg into the skin every 28 (twenty-eight) days.   Lasmiditan Succinate (REYVOW) 100 MG TABS, Take 100 mg by mouth as needed. Take 1 tablet for headache. No more than 1 tablet in 24 hours   meloxicam (MOBIC) 15 MG tablet, TAKE 1 TABLET (15 MG TOTAL) BY MOUTH DAILY AS NEEDED FOR PAIN.   Current Outpatient Medications (Other):    Cholecalciferol (VITAMIN D) 50 MCG (2000 UT) CAPS, Take 4,000 Units by mouth daily.   cyclobenzaprine (FLEXERIL) 5 MG tablet, TAKE 1 TABLET BY MOUTH THREE TIMES DAILY AS NEEDED FOR MUSCLE SPASMS.  Diclofenac Sodium (PENNSAID) 2 % SOLN, Place 2 g onto the skin 2 (two) times daily.   DULoxetine (CYMBALTA) 60 MG capsule, Take 60 mg by mouth daily.   esomeprazole (NEXIUM) 40 MG capsule, TAKE 1 CAPSULE BY MOUTH 2 TIMES DAILY BEFORE A MEAL.   famotidine (PEPCID) 20 MG tablet, Take 1 tablet (20 mg total) by mouth 2 (two) times daily.   gabapentin (NEURONTIN) 300 MG capsule, TAKE 2 CAPSULES BY MOUTH 2 TIMES DAILY   glucose blood (ACCU-CHEK GUIDE) test strip, Use as instructed 2x a day   Insulin Pen Needle 32G X 4 MM MISC, USE AS DIRECTED.   Lancets (FREESTYLE) lancets, USE 1 FOUR TIMES DAILY AS DIRECTED. (Patient taking differently: USE 1 FOUR TIMES DAILY AS DIRECTED.)   linaclotide (LINZESS) 145 MCG CAPS capsule, TAKE 1 CAPSULE (145 MCG TOTAL) BY MOUTH DAILY BEFORE BREAKFAST.   meclizine (ANTIVERT) 12.5 MG tablet, Take 1 tablet (12.5 mg total) by mouth 3 (three) times daily as needed for dizziness.   metoCLOPramide (REGLAN) 5 MG tablet, Take 1 tablet (5 mg total) by mouth every 8 (eight) hours as needed for nausea or vomiting.   Multiple Vitamin (MULTIVITAMIN WITH MINERALS) TABS tablet, Take 1 tablet by mouth daily.   nystatin-triamcinolone ointment (MYCOLOG), APPLY 1  APPLICATION TOPICALLY 2 (TWO) TIMES DAILY.   ondansetron (ZOFRAN ODT) 8 MG disintegrating tablet, Take 1 tablet (8 mg total) by mouth every 8 (eight) hours as needed for nausea or vomiting.   ondansetron (ZOFRAN) 4 MG tablet, TAKE 1 TABLET BY MOUTH EVERY 8 HOURS AS NEEDED FOR NAUSEA OR VOMITING.   Reviewed prior external information including notes and imaging from  primary care provider As well as notes that were available from care everywhere and other healthcare systems.  Past medical history, social, surgical and family history all reviewed in electronic medical record.  No pertanent information unless stated regarding to the chief complaint.   Review of Systems:  No headache, visual changes, nausea, vomiting, diarrhea, constipation, dizziness, abdominal pain, skin rash, fevers, chills, night sweats, weight loss, swollen lymph nodes, body aches, joint swelling, chest pain, shortness of breath, mood changes. POSITIVE muscle aches  Objective  Blood pressure 110/82, pulse 95, height 5' (1.524 m), weight 142 lb (64.4 kg), SpO2 99 %.   General: No apparent distress alert and oriented x3 mood and affect normal, dressed appropriately.  HEENT: Pupils equal, extraocular movements intact patient does have a cat noted over the bridge of her nose that seems to be healing relatively well.  Also has an area just above the left alae that also is healing with no signs of any infectious etiology. Respiratory: Patient's speak in full sentences and does not appear short of breath  Cardiovascular: No lower extremity edema, non tender, no erythema  Gait mild antalgic favoring the left knee MSK: Patient is diffusely tender to even light palpation in the paraspinal musculature in multiple different areas.  Patient does have tightness of the neck noted.  Negative Spurling's.  Patient does have voluntary guarding noted of the neck.  Seems to have good grip strength.  Neurovascularly intact Patient does have some  pain in the midepigastric region.  Voluntary guarding noted.  No masses appreciated.  Patient now is tender.  Osteopathic findings C7 flexed rotated and side bent right T6 extended rotated and side bent right with inhaled rib L5 flexed rotated and side bent left Sacrum left on left    Impression and Recommendations:     The above  documentation has been reviewed and is accurate and complete Lyndal Pulley, DO

## 2021-01-02 ENCOUNTER — Ambulatory Visit: Payer: 59 | Admitting: Family Medicine

## 2021-01-16 ENCOUNTER — Other Ambulatory Visit: Payer: Self-pay

## 2021-01-16 ENCOUNTER — Encounter: Payer: Self-pay | Admitting: Gastroenterology

## 2021-01-16 ENCOUNTER — Ambulatory Visit (INDEPENDENT_AMBULATORY_CARE_PROVIDER_SITE_OTHER): Payer: 59 | Admitting: Gastroenterology

## 2021-01-16 VITALS — BP 110/80 | HR 90 | Ht 60.0 in | Wt 139.0 lb

## 2021-01-16 DIAGNOSIS — R140 Abdominal distension (gaseous): Secondary | ICD-10-CM | POA: Diagnosis not present

## 2021-01-16 DIAGNOSIS — R112 Nausea with vomiting, unspecified: Secondary | ICD-10-CM

## 2021-01-16 DIAGNOSIS — K219 Gastro-esophageal reflux disease without esophagitis: Secondary | ICD-10-CM

## 2021-01-16 NOTE — Progress Notes (Signed)
[facility] Consult Note:  History: Rachel [name] 01/16/2021  Referring provider: Biagio Borg, MD  Reason for consult/chief complaint: Abdominal Pain (Pt reports intermittent mid abdominal pain, sometimes accompanied by nausea, vomiting, and diarrhea; still having reflux symptoms in spite of twice a day Nexium and Pepcid)   Subjective  HPI: From my 08/28/2015 office consult note: "This is the initial office consult for a 55 year old woman referred for chronic GERD as well as more recent onset dysphagia and throat discomfort. She reports about 15 years of frequent GERD symptoms with pyrosis and regurgitation. This has required daily Nexium use. For about the last year, she has a fairly constant sensation of fullness and something in the throat.  It seems worse when eating, and she has episodes of coughing and choking when eating solid food or liquids. She wakes at night with coughing or choking as well. She denies early satiety, nausea, vomiting, or weight loss. She is concerned because she thinks her tonsils may be too big. Also, several family members have needed tonsils removed for recurrent infection. She denies sinus congestion or drainage or postnasal drip Bozena was to have a screening colonoscopy with Dr. Carlean Purl a few months ago, but says that the liquid diet the day before and the bowel prep gave her a migraine, and she had to cancel. Bowel habits are regular, she denies rectal bleeding, but she tends toward abdominal bloating if she sits too long."  Patient declined a repeat attempt at colonoscopy.  Upper endoscopy on 08/29/2015 with 3 cm hiatal hernia, few dispersed gastric erosions (biopsy negative for H. pylori).  Patient's PPI was increased, office follow-up planned with patient has not been seen since then. __________________  Rachel [name] was referred back by primary care after recent office visit due to concern for worsening GERD as well as nausea and vomiting.   I reviewed the primary care note from 12/30/2020, at which time she was prescribed Phenergan. Catalea describes chronic nausea that is probably been going on since at least last year based on some timing of prescriptions (see below).  She says it has been worse in the last few weeks.  This seems to coincide with a worsening of her hemoglobin A1c, which she attributes to being unable to take her insulin at times due to her nausea.  Earlier this week she had acute onset of generalized abdominal pain in the middle the night, says it was quite severe, she then vomited multiple times, felt dizzy and later had passage of nonbloody diarrhea.  Symptoms started abating after that and she still has some residual bloating and cramps.  No recent antibiotic use or sick contacts.  She is bothered by persistent regurgitation and pyrosis both daytime and night despite twice daily acid suppression.  Her bowel habits tend to alternate but primarily constipation, and she takes Linzess as needed.  Rachel [name] has had diabetes for about 35 years since her pregnancy, and I can see from records that during much of 2021 the control was poor with very high hemoglobin A1c values.  ROS:  Review of Systems  Constitutional:  Negative for appetite change and unexpected weight change.  HENT:  Negative for mouth sores and voice change.   Eyes:  Negative for pain and redness.  Respiratory:  Negative for cough and shortness of breath.   Cardiovascular:  Negative for chest pain and palpitations.  Genitourinary:  Negative for dysuria and hematuria.  Musculoskeletal:  Positive for back pain. Negative for arthralgias and myalgias.  Skin:  Negative for pallor and rash.  Allergic/Immunologic: Positive for environmental allergies.  Neurological:  Negative for weakness and headaches.  Hematological:  Negative for adenopathy.  Psychiatric/Behavioral:         Depression  Falls  Past Medical History: Past Medical History:  Diagnosis Date    ALLERGIC RHINITIS 10/04/2007   Anemia 01/21/2011   Anxiety 11/15/2018   ASTHMA 08/02/2007   INHALERS ONLY IN [place]   Asthma 08/02/2007   Qualifier: Diagnosis of  By: Wynona Luna    Blood transfusion without reported diagnosis    with first child    Cervical radiculopathy 01/21/2011   Cervicogenic headache 04/27/2017   COMMON MIGRAINE 05/15/2009   Depression 01/09/2010   Qualifier: Diagnosis of  By: Jenny Reichmann MD, Hunt Oris    DIABETES MELLITUS, TYPE II 08/02/2007   Dysphagia, pharyngoesophageal phase 06/12/2014   GERD 08/02/2007   HYPERLIPIDEMIA 08/02/2007   Hyperlipidemia 08/02/2007   Qualifier: Diagnosis of  By: Wynona Luna    INSOMNIA-SLEEP [place] 01/04/2008   INTERMITTENT VERTIGO 05/15/2009   LIBIDO, DECREASED 01/09/2010   Migraine without aura 05/15/2009   Qualifier: Diagnosis of  By: Jenny Reichmann MD, Hunt Oris    Nonallopathic lesion of rib cage 12/21/2017   Nonallopathic lesion of sacral region 07/29/2015   Nonallopathic lesion of thoracic region 07/29/2015   Osteoporosis 04/25/2019   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   Rheumatoid factor positive 02/10/2016   Right knee pain 01/31/2019   Injected January 31, 2019   Type 2 diabetes mellitus with hyperglycemia, with long-term current use of insulin ([place]) 08/02/2007   Qualifier: Diagnosis of  By: Wynona Luna      Past Surgical History: Past Surgical History:  Procedure Laterality Date   CESAREAN SECTION     x 3   CYST EXCISION     left knee   PAROTIDECTOMY  10/21/2011   Procedure: PAROTIDECTOMY;  Surgeon: Melida Quitter, MD;  Location: [facility] OR;  Service: ENT;  Laterality: Left;     Family History: Family History  Problem Relation Age of Onset   Hypertension Father    Diabetes Father    Asthma Father    Cervical cancer Maternal Aunt    Heart disease Maternal Aunt    Anesthesia problems Neg Hx    Colon cancer Neg Hx    Breast cancer Neg Hx     Social History: Social History   Socioeconomic History    Marital status: Married    Spouse name: [name]   Number of children: 3   Years of education: Degree   Highest education level: Not on file  Occupational History   Occupation: Chartered certified accountant    Employer: [employer]   Occupation: Chartered certified accountant    Employer: [employer]  Tobacco Use   Smoking status: Never   Smokeless tobacco: Never  Vaping Use   Vaping Use: Never used  Substance and Sexual Activity   Alcohol use: No    Alcohol/week: 0.0 standard drinks   Drug use: No   Sexual activity: Not on file  Other Topics Concern   Not on file  Social History Narrative   She has 3 daughters.   Patient has a 4 year degree.    Patient working at [employer].    Patient is married to [place].   Social Determinants of Health   Financial Resource Strain: Not on file  Food Insecurity: Not on file  Transportation Needs: Not on file  Physical Activity: Not on file  Stress: Not on file  Social Connections: Not on file    Allergies: Allergies  Allergen Reactions   Lipitor [Atorvastatin]     REACTION: myalgias   Nitroglycerin     Outpatient Meds: Current Outpatient Medications  Medication Sig Dispense Refill   albuterol (VENTOLIN HFA) 108 (90 Base) MCG/ACT inhaler INHALE 2 PUFFS INTO THE LUNGS EVERY 6 (SIX) HOURS AS NEEDED FOR WHEEZING OR SHORTNESS OF BREATH. 18 g 5   aspirin 81 MG EC tablet Take 81 mg by mouth daily.     butalbital-acetaminophen-caffeine (FIORICET) 50-325-40 MG tablet Take 1 tablet by mouth every 6 (six) hours as needed for headache. Max 2 tablets per day 30 tablet 3   Cholecalciferol (VITAMIN D) 50 MCG (2000 UT) CAPS Take 4,000 Units by mouth daily.     cyclobenzaprine (FLEXERIL) 5 MG tablet TAKE 1 TABLET BY MOUTH THREE TIMES DAILY AS NEEDED FOR MUSCLE SPASMS. 90 tablet 1   Diclofenac Sodium (PENNSAID) 2 % SOLN Place 2 g onto the skin 2 (two) times daily. 112 g 3   DULoxetine (CYMBALTA) 60 MG capsule Take 60 mg by mouth daily.     esomeprazole (NEXIUM) 40 MG  capsule TAKE 1 CAPSULE BY MOUTH 2 TIMES DAILY BEFORE A MEAL. 180 capsule 3   famotidine (PEPCID) 20 MG tablet Take 1 tablet (20 mg total) by mouth 2 (two) times daily. 60 tablet 5   Fremanezumab-vfrm (AJOVY) 225 MG/1.5ML SOAJ Inject 225 mg into the skin every 28 (twenty-eight) days. 1.5 mL 5   gabapentin (NEURONTIN) 300 MG capsule TAKE 2 CAPSULES BY MOUTH 2 TIMES DAILY 120 capsule 5   glucose blood (ACCU-CHEK GUIDE) test strip Use as instructed 2x a day 200 each 12   insulin glargine (LANTUS) 100 UNIT/ML Solostar Pen Inject under skin 28 units daily 30 mL 1   Insulin Pen Needle 32G X 4 MM MISC USE AS DIRECTED. 200 each 3   Insulin Regular Human (NOVOLIN R FLEXPEN) 100 UNIT/ML SOPN inject 36 units daily     Lancets (FREESTYLE) lancets USE 1 FOUR TIMES DAILY AS DIRECTED. (Patient taking differently: USE 1 FOUR TIMES DAILY AS DIRECTED.) 400 each 12   Lasmiditan Succinate (REYVOW) 100 MG TABS Take 100 mg by mouth as needed. Take 1 tablet for headache. No more than 1 tablet in 24 hours 8 tablet 3   linaclotide (LINZESS) 145 MCG CAPS capsule TAKE 1 CAPSULE (145 MCG TOTAL) BY MOUTH DAILY BEFORE BREAKFAST. 30 capsule 2   meclizine (ANTIVERT) 12.5 MG tablet Take 1 tablet (12.5 mg total) by mouth 3 (three) times daily as needed for dizziness. 40 tablet 1   meloxicam (MOBIC) 15 MG tablet TAKE 1 TABLET (15 MG TOTAL) BY MOUTH DAILY AS NEEDED FOR PAIN. 30 tablet 1   metoCLOPramide (REGLAN) 5 MG tablet Take 1 tablet (5 mg total) by mouth every 8 (eight) hours as needed for nausea or vomiting. 40 tablet 1   Multiple Vitamin (MULTIVITAMIN WITH MINERALS) TABS tablet Take 1 tablet by mouth daily.     nystatin-triamcinolone ointment (MYCOLOG) APPLY 1 APPLICATION TOPICALLY 2 (TWO) TIMES DAILY. 30 g 2   ondansetron (ZOFRAN ODT) 8 MG disintegrating tablet Take 1 tablet (8 mg total) by mouth every 8 (eight) hours as needed for nausea or vomiting. 30 tablet 0   ondansetron (ZOFRAN) 4 MG tablet TAKE 1 TABLET BY MOUTH EVERY 8  HOURS AS NEEDED FOR NAUSEA OR VOMITING. 40 tablet 1   promethazine (PHENERGAN) 25  MG tablet Take 1 tablet (25 mg total) by mouth every 6 (six) hours as needed for nausea 20 tablet 5   Semaglutide,0.25 or 0.'5MG'$ /DOS, 2 MG/1.5ML SOPN INJECT 0.5 MG INTO THE SKIN ONCE A WEEK. 4.5 mL 3   triamcinolone (NASACORT) 55 MCG/ACT AERO nasal inhaler Place 2 sprays into the nose daily. 1 Inhaler 12   No current facility-administered medications for this visit.    Reglan was Rx in 01/2019 Zofran Rx in 06/2020 Phenergan Rx 12/30/20  ___________________________________________________________________ Objective   Exam:  BP 110/80   Pulse 90   Ht 5' (1.524 m)   Wt 139 lb (63 kg)   BMI 27.15 kg/m  Wt Readings from Last 3 Encounters:  01/16/21 139 lb (63 kg)  01/01/21 142 lb (64.4 kg)  01/01/21 142 lb (64.4 kg)    General: Not acutely ill-appearing.  Well-hydrated. Eyes: sclera anicteric, no redness ENT: oral mucosa moist without lesions, no cervical or supraclavicular lymphadenopathy CV: RRR without murmur, S1/S2, no JVD, no peripheral edema Resp: clear to auscultation bilaterally, normal RR and effort noted GI: soft, mild multifocal tenderness, with active bowel sounds. No guarding or palpable organomegaly noted. Skin; warm and dry, no rash or jaundice noted Neuro: awake, alert and oriented x 3. Normal gross motor function and fluent speech  Labs:  Hgb A1c 7.8 in January, 6.6 in early April, 8.3 on 12/30/20 - patient says b/c no taking insulin as much due to nausea As high as 11.2 last year  Radiologic Studies:  Nml GES 2010  Assessment: Encounter Diagnoses  Name Primary?   Nausea and vomiting in adult Yes   Gastroesophageal reflux disease without esophagitis    Abdominal bloating     Chronic GERD, symptoms worsened last several months.  Also more recent nausea and vomiting.  Not clear if that is a symptom of worsening GERD and perhaps enlarging hiatal hernia, or if she may have  additional condition such as delayed gastric emptying.  Chronic abdominal bloating and gas and altered bowel habits suggesting functional bowel disorder.  Consider SIBO She has been taking Phenergan lately, but says gives her migraines so she has to take additional medicine for that.  However, she cannot recall for sure if the metoclopramide or ondansetron were helpful to her.  Plan:  Upper endoscopy.  She was agreeable after discussion of procedure and risks.  The benefits and risks of the planned procedure were described in detail with the patient or (when appropriate) their health care proxy.  Risks were outlined as including, but not limited to, bleeding, infection, perforation, adverse medication reaction leading to cardiac or pulmonary decompensation, pancreatitis (if ERCP).  The limitation of incomplete mucosal visualization was also discussed.  No guarantees or warranties were given.  Probable gastric emptying study to follow, would need to be off antiemetics 5 days prior.  Thank you for the courtesy of this consult.  Please call me with any questions or concerns.  Nelida Meuse III  CC: Referring provider noted above

## 2021-01-16 NOTE — Patient Instructions (Signed)
If you are age 57 or older, your body mass index should be between 23-30. Your Body mass index is 27.15 kg/m. If this is out of the aforementioned range listed, please consider follow up with your Primary Care Provider.  If you are age 14 or younger, your body mass index should be between 19-25. Your Body mass index is 27.15 kg/m. If this is out of the aformentioned range listed, please consider follow up with your Primary Care Provider.   __________________________________________________________  The [place] [facility] providers would like to encourage you to use [facility] to communicate with providers for non-urgent requests or questions.  Due to long hold times on the telephone, sending your provider a message by [facility] may be a faster and more efficient way to get a response.  Please allow 48 business hours for a response.  Please remember that this is for non-urgent requests.   You have been scheduled for an endoscopy. Please follow written instructions given to you at your visit today. If you use inhalers (even only as needed), please bring them with you on the day of your procedure.  It was a pleasure to see you today!  Thank you for trusting me with your gastrointestinal care!

## 2021-01-19 ENCOUNTER — Encounter: Payer: Self-pay | Admitting: Gastroenterology

## 2021-01-19 ENCOUNTER — Ambulatory Visit (AMBULATORY_SURGERY_CENTER): Payer: 59 | Admitting: Gastroenterology

## 2021-01-19 ENCOUNTER — Other Ambulatory Visit: Payer: Self-pay

## 2021-01-19 VITALS — BP 100/73 | HR 95 | Temp 98.4°F | Resp 16 | Ht 60.0 in | Wt 139.0 lb

## 2021-01-19 DIAGNOSIS — K2970 Gastritis, unspecified, without bleeding: Secondary | ICD-10-CM

## 2021-01-19 DIAGNOSIS — K2950 Unspecified chronic gastritis without bleeding: Secondary | ICD-10-CM

## 2021-01-19 DIAGNOSIS — K317 Polyp of stomach and duodenum: Secondary | ICD-10-CM

## 2021-01-19 DIAGNOSIS — K449 Diaphragmatic hernia without obstruction or gangrene: Secondary | ICD-10-CM | POA: Diagnosis not present

## 2021-01-19 DIAGNOSIS — R112 Nausea with vomiting, unspecified: Secondary | ICD-10-CM

## 2021-01-19 MED ORDER — SODIUM CHLORIDE 0.9 % IV SOLN: 500.0000 mL | Freq: Once | INTRAVENOUS | Status: DC

## 2021-01-19 NOTE — Patient Instructions (Addendum)
Resume previous diet, continue present medications. Awaiting pathology results.  Do a gastric emptying study at appointment to be scheduled. Remain off all anti-emetics 5 days prior to this study.  YOU HAD AN ENDOSCOPIC PROCEDURE TODAY AT [facility]:   Refer to the procedure report that was given to you for any specific questions about what was found during the examination.  If the procedure report does not answer your questions, please call your gastroenterologist to clarify.  If you requested that your care partner not be given the details of your procedure findings, then the procedure report has been included in a sealed envelope for you to review at your convenience later.  YOU SHOULD EXPECT: Some feelings of bloating in the abdomen. Passage of more gas than usual.  Walking can help get rid of the air that was put into your GI tract during the procedure and reduce the bloating. If you had a lower endoscopy (such as a colonoscopy or flexible sigmoidoscopy) you may notice spotting of blood in your stool or on the toilet paper. If you underwent a bowel prep for your procedure, you may not have a normal bowel movement for a few days.  Please Note:  You might notice some irritation and congestion in your nose or some drainage.  This is from the oxygen used during your procedure.  There is no need for concern and it should clear up in a day or so.  SYMPTOMS TO REPORT IMMEDIATELY:   Following upper endoscopy (EGD)  Vomiting of blood or coffee ground material  New chest pain or pain under the shoulder blades  Painful or persistently difficult swallowing  New shortness of breath  Fever of 100F or higher  Black, tarry-looking stools  For urgent or emergent issues, a gastroenterologist can be reached at any hour by calling [phone]. Do not use MyChart messaging for urgent concerns.    DIET:  We do recommend a small meal at first, but then you may proceed to your regular  diet.  Drink plenty of fluids but you should avoid alcoholic beverages for 24 hours.  ACTIVITY:  You should plan to take it easy for the rest of today and you should NOT DRIVE or use heavy machinery until tomorrow (because of the sedation medicines used during the test).    FOLLOW UP: Our staff will call the number listed on your records 48-72 hours following your procedure to check on you and address any questions or concerns that you may have regarding the information given to you following your procedure. If we do not reach you, we will leave a message.  We will attempt to reach you two times.  During this call, we will ask if you have developed any symptoms of COVID 19. If you develop any symptoms (ie: fever, flu-like symptoms, shortness of breath, cough etc.) before then, please call [phone].  If you test positive for Covid 19 in the 2 weeks post procedure, please call and report this information to [place].    If any biopsies were taken you will be contacted by phone or by letter within the next 1-3 weeks.  Please call us at [phone] if you have not heard about the biopsies in 3 weeks.    SIGNATURES/CONFIDENTIALITY: You and/or your care partner have signed paperwork which will be entered into your electronic medical record.  These signatures attest to the fact that that the information above on your After Visit Summary has been reviewed and is  understood.  Full responsibility of the confidentiality of this discharge information lies with you and/or your care-partner.

## 2021-01-19 NOTE — Progress Notes (Signed)
No changes to clinical history since recent office visit (01/16/21).

## 2021-01-19 NOTE — Progress Notes (Signed)
Called to room to assist during endoscopic procedure.  Patient ID and intended procedure confirmed with present staff. Received instructions for my participation in the procedure from the performing physician.  

## 2021-01-19 NOTE — Progress Notes (Signed)
Vitals by CW 

## 2021-01-19 NOTE — Op Note (Signed)
[place] Patient Name: Ayumi Dip Procedure Date: 01/19/2021 8:33 AM MRN: [id number] Endoscopist: Mallie Mussel L. Loletha Carrow , MD Age: 55 Referring MD:  Date of Birth: 02/25/1966 Gender: Female Account #: [id number] Procedure:                Upper GI endoscopy Indications:              Esophageal dysphagia, Esophageal reflux symptoms                            that persist despite appropriate therapy, Nausea                            with vomiting                           see 01/16/21 office note for clinical details Medicines:                Monitored Anesthesia Care Procedure:                Pre-Anesthesia Assessment:                           - Prior to the procedure, a History and Physical                            was performed, and patient medications and                            allergies were reviewed. The patient's tolerance of                            previous anesthesia was also reviewed. The risks                            and benefits of the procedure and the sedation                            options and risks were discussed with the patient.                            All questions were answered, and informed consent                            was obtained. Prior Anticoagulants: The patient has                            taken no previous anticoagulant or antiplatelet                            agents. ASA Grade Assessment: II - A patient with                            mild systemic disease. After reviewing the risks  and benefits, the patient was deemed in                            satisfactory condition to undergo the procedure.                           After obtaining informed consent, the endoscope was                            passed under direct vision. Throughout the                            procedure, the patient's blood pressure, pulse, and                            oxygen saturations were monitored continuously.  The                            Endoscope was introduced through the mouth, and                            advanced to the second part of duodenum. The upper                            GI endoscopy was accomplished without difficulty.                            The patient tolerated the procedure well. Scope In: Scope Out: Findings:                 The larynx was normal.                           A 4 cm hiatal hernia was present. (EGJ 31cm from                            teeth). The hernia is causing mild distal                            esophageal tortuosity.                           There is no endoscopic evidence of Barrett's                            esophagus, esophagitis or stricture in the entire                            esophagus.                           Striped mildly erythematous mucosa was found in the                            prepyloric region of the stomach. Several biopsies  were obtained on the greater curvature of the                            gastric body, on the lesser curvature of the                            gastric body, on the greater curvature of the                            gastric antrum and on the lesser curvature of the                            gastric antrum with cold forceps for histology.                           Multiple small sessile fundic gland polyps were                            found in the gastric body. One such small polyp in                            the distal gastric body had a (possibly)                            adenomatous appearance Biopsies were taken with a                            cold forceps for histology (entire polyp removed).                           The exam of the stomach was otherwise normal.                           The examined duodenum was normal. Complications:            No immediate complications. Estimated Blood Loss:     Estimated blood loss was minimal. Impression:                - Normal larynx.                           - 4 cm hiatal hernia. Accounts for dysphagia.                           - Erythematous mucosa in the prepyloric region of                            the stomach.                           - Multiple fundic gland polyps. Biopsied.                            (benign-appearing)                           -  Normal examined duodenum.                           - Several biopsies were obtained on the greater                            curvature of the gastric body, on the lesser                            curvature of the gastric body, on the greater                            curvature of the gastric antrum and on the lesser                            curvature of the gastric antrum. Recommendation:           - Patient has a contact number available for                            emergencies. The signs and symptoms of potential                            delayed complications were discussed with the                            patient. Return to normal activities tomorrow.                            Written discharge instructions were provided to the                            patient.                           - Resume previous diet.                           - Continue present medications.                           - Await pathology results.                           - Do a gastric emptying study at appointment to be                            scheduled. Remain off all anti-emetics 5 days prior                            to this study. Makar Slatter L. Loletha Carrow, MD 01/19/2021 9:02:59 AM This report has been signed electronically.

## 2021-01-19 NOTE — Progress Notes (Signed)
Pt has short burst of SVT to 112 before any medications given. Pt was waiting quietly for physician to come speak with her. Compensatory pause and then back to SR in 80's. Pt states she does feel these flutters occassionally.tb

## 2021-01-19 NOTE — Progress Notes (Signed)
To PACU, VSS. Report to Rn.tb 

## 2021-01-20 ENCOUNTER — Telehealth: Payer: Self-pay

## 2021-01-20 DIAGNOSIS — K219 Gastro-esophageal reflux disease without esophagitis: Secondary | ICD-10-CM

## 2021-01-20 DIAGNOSIS — K449 Diaphragmatic hernia without obstruction or gangrene: Secondary | ICD-10-CM

## 2021-01-20 DIAGNOSIS — K2970 Gastritis, unspecified, without bleeding: Secondary | ICD-10-CM

## 2021-01-20 DIAGNOSIS — R112 Nausea with vomiting, unspecified: Secondary | ICD-10-CM

## 2021-01-20 NOTE — Telephone Encounter (Signed)
GES order in epic. Secure staff message sent to radiology schedulers to set up appt. Pt notified and aware that she will need to hold Phenergan 5 days prior to appt. I have provided patient with the radiology schedulers number as well. Patient verbalized understanding and had no concerns at the end of the call.

## 2021-01-20 NOTE — Telephone Encounter (Signed)
-----   Message from Doran Stabler, MD sent at 01/19/2021  4:27 PM EDT ----- Please arrange a gastric emptying study for this patient.  I believe she has recently been on Phenergan, which must be held 5 days prior to the test if she is able to do so.  - HD

## 2021-01-21 ENCOUNTER — Telehealth: Payer: Self-pay

## 2021-01-21 ENCOUNTER — Other Ambulatory Visit (HOSPITAL_COMMUNITY): Payer: Self-pay

## 2021-01-21 NOTE — Telephone Encounter (Signed)
Left message on follow up call. 

## 2021-01-21 NOTE — Telephone Encounter (Signed)
  Follow up Call-  Call back number 01/19/2021  Post procedure Call Back phone  # [phone]  Permission to leave phone message Yes  Some recent data might be hidden     Patient questions:  Do you have a fever, pain , or abdominal swelling? No. Pain Score  0 *  Have you tolerated food without any problems? Yes.    Have you been able to return to your normal activities? Yes.    Do you have any questions about your discharge instructions: Diet   No. Medications  No. Follow up visit  No.  Do you have questions or concerns about your Care? No.  Actions: * If pain score is 4 or above: No action needed, pain <4. Have you developed a fever since your procedure? no  2.   Have you had an respiratory symptoms (SOB or cough) since your procedure? no  3.   Have you tested positive for COVID 19 since your procedure no  4.   Have you had any family members/close contacts diagnosed with the COVID 19 since your procedure?  no   If yes to any of these questions please route to Joylene John, RN and Joella Prince, RN

## 2021-01-23 ENCOUNTER — Encounter: Payer: Self-pay | Admitting: Gastroenterology

## 2021-01-26 ENCOUNTER — Other Ambulatory Visit (HOSPITAL_COMMUNITY): Payer: Self-pay

## 2021-01-26 MED FILL — Esomeprazole Magnesium Cap Delayed Release 40 MG (Base Eq): ORAL | 30 days supply | Qty: 60 | Fill #1 | Status: AC

## 2021-01-27 ENCOUNTER — Other Ambulatory Visit (HOSPITAL_COMMUNITY): Payer: Self-pay

## 2021-01-27 ENCOUNTER — Telehealth: Payer: Self-pay

## 2021-01-27 NOTE — Telephone Encounter (Signed)
Patient reporting that insurance is not willing to pay for Nexium twice daily without a prior auth.

## 2021-01-27 NOTE — Telephone Encounter (Signed)
PA has been submitted to plan. Waiting for decision from plan.  Key: [id number]

## 2021-01-29 NOTE — Progress Notes (Deleted)
[place] Phone: [phone] Subjective:    I'm seeing this patient by the request  of:  Biagio Borg, MD  CC:   [id number]  Rachel [name] is a 55 y.o. female coming in with complaint of back and neck pain. OMT 01/01/21 and 08/28/2020. Had a Toradol and Depo-Medrol injection at her last visit.  Patient states   Medications patient has been prescribed: Flexeril  Taking:         Reviewed prior external information including notes and imaging from previsou exam, outside providers and external EMR if available.   As well as notes that were available from care everywhere and other healthcare systems.  Past medical history, social, surgical and family history all reviewed in electronic medical record.  No pertanent information unless stated regarding to the chief complaint.   Past Medical History:  Diagnosis Date   ALLERGIC RHINITIS 10/04/2007   Anemia 01/21/2011   Anxiety 11/15/2018   ASTHMA 08/02/2007   INHALERS ONLY IN [place]   Asthma 08/02/2007   Qualifier: Diagnosis of  By: Wynona Luna    Blood transfusion without reported diagnosis    with first child    Cervical radiculopathy 01/21/2011   Cervicogenic headache 04/27/2017   COMMON MIGRAINE 05/15/2009   Depression 01/09/2010   Qualifier: Diagnosis of  By: Jenny Reichmann MD, [place], TYPE II 08/02/2007   Dysphagia, pharyngoesophageal phase 06/12/2014   GERD 08/02/2007   HYPERLIPIDEMIA 08/02/2007   Hyperlipidemia 08/02/2007   Qualifier: Diagnosis of  By: Wynona Luna    INSOMNIA-SLEEP [place] 01/04/2008   INTERMITTENT VERTIGO 05/15/2009   LIBIDO, DECREASED 01/09/2010   Migraine without aura 05/15/2009   Qualifier: Diagnosis of  By: Jenny Reichmann MD, [place]    Nonallopathic lesion of rib cage 12/21/2017   Nonallopathic lesion of sacral region 07/29/2015   Nonallopathic lesion of thoracic region 07/29/2015   Osteoporosis 04/25/2019    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   Rheumatoid factor positive 02/10/2016   Right knee pain 01/31/2019   Injected January 31, 2019   Type 2 diabetes mellitus with hyperglycemia, with long-term current use of insulin ([place]) 08/02/2007   Qualifier: Diagnosis of  By: Wynona Luna     Allergies  Allergen Reactions   Lipitor [Atorvastatin]     REACTION: myalgias   Nitroglycerin      Review of Systems:  No headache, visual changes, nausea, vomiting, diarrhea, constipation, dizziness, abdominal pain, skin rash, fevers, chills, night sweats, weight loss, swollen lymph nodes, body aches, joint swelling, chest pain, shortness of breath, mood changes. POSITIVE muscle aches  Objective  There were no vitals taken for this visit.   General: No apparent distress alert and oriented x3 mood and affect normal, dressed appropriately.  HEENT: Pupils equal, extraocular movements intact  Respiratory: Patient's speak in full sentences and does not appear short of breath  Cardiovascular: No lower extremity edema, non tender, no erythema  Neuro: Cranial nerves II through XII are intact, neurovascularly intact in all extremities with 2+ DTRs and 2+ pulses.  Gait normal with good balance and coordination.  MSK:  Non tender with full range of motion and good stability and symmetric strength and tone of shoulders, elbows, wrist, hip, knee and ankles bilaterally.  Back - Normal skin, Spine with normal alignment and no deformity.  No tenderness to vertebral process palpation.  Paraspinous muscles are not tender and  without spasm.   Range of motion is full at neck and lumbar sacral regions  Osteopathic findings  C2 flexed rotated and side bent right C6 flexed rotated and side bent left T3 extended rotated and side bent right inhaled rib T9 extended rotated and side bent left L2 flexed rotated and side bent right Sacrum right on right       Assessment and Plan:    Nonallopathic  problems  Decision today to treat with OMT was based on Physical Exam  After verbal consent patient was treated with HVLA, ME, FPR techniques in cervical, rib, thoracic, lumbar, and sacral  areas  Patient tolerated the procedure well with improvement in symptoms  Patient given exercises, stretches and lifestyle modifications  See medications in patient instructions if given  Patient will follow up in 4-8 weeks      The above documentation has been reviewed and is accurate and complete Jacqualin Combes       Note: This dictation was prepared with Dragon dictation along with smaller phrase technology. Any transcriptional errors that result from this process are unintentional.

## 2021-01-30 ENCOUNTER — Other Ambulatory Visit (HOSPITAL_COMMUNITY): Payer: Self-pay

## 2021-01-30 ENCOUNTER — Ambulatory Visit: Payer: 59 | Admitting: Family Medicine

## 2021-02-03 ENCOUNTER — Encounter (HOSPITAL_COMMUNITY)
Admission: RE | Admit: 2021-02-03 | Discharge: 2021-02-03 | Disposition: A | Discharge status: Home / Self Care | Payer: 59 | Source: Ambulatory Visit | Attending: Gastroenterology | Admitting: Gastroenterology

## 2021-02-03 ENCOUNTER — Other Ambulatory Visit: Payer: Self-pay

## 2021-02-03 DIAGNOSIS — K2970 Gastritis, unspecified, without bleeding: Secondary | ICD-10-CM | POA: Diagnosis present

## 2021-02-03 DIAGNOSIS — K219 Gastro-esophageal reflux disease without esophagitis: Secondary | ICD-10-CM | POA: Diagnosis present

## 2021-02-03 DIAGNOSIS — K449 Diaphragmatic hernia without obstruction or gangrene: Secondary | ICD-10-CM | POA: Insufficient documentation

## 2021-02-03 DIAGNOSIS — R112 Nausea with vomiting, unspecified: Secondary | ICD-10-CM | POA: Insufficient documentation

## 2021-02-03 MED ORDER — TECHNETIUM TC 99M SULFUR COLLOID
2.0000 | Freq: Once | INTRAVENOUS | Status: AC | PRN
  Administered 2021-02-03: 2 via INTRAVENOUS

## 2021-02-03 NOTE — Progress Notes (Signed)
Rachel [name]: [id number] Subjective:   [name], am serving as a scribe for Dr. Hulan Saas.  This visit occurred during the SARS-CoV-2 public health emergency.  Safety protocols were in place, including screening questions prior to the visit, additional usage of staff PPE, and extensive cleaning of exam room while observing appropriate contact time as indicated for disinfecting solutions.   I'm seeing this patient by the request  of:  Biagio Borg, MD  CC: Back and neck pain follow-up  [id number]  Rachel [name] is a 55 y.o. female coming in with complaint of back and neck pain. OMT 01/01/2021. Patient states that her neck is tight. Patient also would like to get injections in both knees today as well for pain throughout the joint.  Patient has known patellofemoral syndrome.  Has responded well to injections previously and feels like she does need them again.  Medications patient has been prescribed: Flexeril  Taking: Yes         Reviewed prior external information including notes and imaging from previsou exam, outside providers and external EMR if available.   As well as notes that were available from care everywhere and other healthcare systems.  Past medical history, social, surgical and family history all reviewed in electronic medical record.  No pertanent information unless stated regarding to the chief complaint.   Past Medical History:  Diagnosis Date   ALLERGIC RHINITIS 10/04/2007   Anemia 01/21/2011   Anxiety 11/15/2018   ASTHMA 08/02/2007   INHALERS ONLY IN [place]   Asthma 08/02/2007   Qualifier: Diagnosis of  By: Wynona Luna    Blood transfusion without reported diagnosis    with first child    Cervical radiculopathy 01/21/2011   Cervicogenic headache 04/27/2017   COMMON MIGRAINE 05/15/2009   Depression 01/09/2010   Qualifier: Diagnosis of  By: Jenny Reichmann MD, [place], TYPE II 08/02/2007   Dysphagia, pharyngoesophageal phase 06/12/2014   GERD 08/02/2007   HYPERLIPIDEMIA 08/02/2007   Hyperlipidemia 08/02/2007   Qualifier: Diagnosis of  By: Wynona Luna    INSOMNIA-SLEEP [place] 01/04/2008   INTERMITTENT VERTIGO 05/15/2009   LIBIDO, DECREASED 01/09/2010   Migraine without aura 05/15/2009   Qualifier: Diagnosis of  By: Jenny Reichmann MD, [place] Oris    Nonallopathic lesion of rib cage 12/21/2017   Nonallopathic lesion of sacral region 07/29/2015   Nonallopathic lesion of thoracic region 07/29/2015   Osteoporosis 04/25/2019   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   Rheumatoid factor positive 02/10/2016   Right knee pain 01/31/2019   Injected January 31, 2019   Type 2 diabetes mellitus with hyperglycemia, with long-term current use of insulin ([place]) 08/02/2007   Qualifier: Diagnosis of  By: Wynona Luna     Allergies  Allergen Reactions   Lipitor [Atorvastatin]     REACTION: myalgias   Nitroglycerin      Review of Systems:  No headache, visual changes, nausea, vomiting, diarrhea, constipation, dizziness, abdominal pain, skin rash, fevers, chills, night sweats, weight loss, swollen lymph nodes, joint swelling, chest pain, shortness of breath, mood changes. POSITIVE muscle aches, body aches  Objective  Blood pressure 104/76, pulse (!) 103, height 5' (1.524 m), weight 143 lb (64.9 kg), SpO2 99 %.   General: No apparent distress alert and oriented x3 mood and affect normal, dressed appropriately.  HEENT: Pupils equal, extraocular movements intact  Respiratory: Patient's  speak in full sentences and does not appear short of breath  Cardiovascular: No lower extremity edema, non tender, no erythema  Patient's knees do have some lateral tracking noted.  Pain is out of proportion to the amount of palpation.  No significant instability of the knee though otherwise. Low back does have some loss of lordosis.  Some tenderness to palpation of  the paraspinal musculature of the lumbar spine.  Diffuse tenderness noted of the soft tissue of the back as well as the neck.  Osteopathic findings  C7 flexed rotated and side bent left T3 extended rotated and side bent right inhaled rib T9 extended rotated and side bent left L2 flexed rotated and side bent right Sacrum right on right  After informed written and verbal consent, patient was seated on exam table. Right knee was prepped with alcohol swab and utilizing anterolateral approach, patient's right knee space was injected with 4:1  marcaine 0.5%: Kenalog '40mg'$ /dL. Patient tolerated the procedure well without immediate complications.  After informed written and verbal consent, patient was seated on exam table. Left knee was prepped with alcohol swab and utilizing anterolateral approach, patient's left knee space was injected with 4:1  marcaine 0.5%: Kenalog '40mg'$ /dL. Patient tolerated the procedure well without immediate complications.     Assessment and Plan:  Cervicogenic headache Chronic issues with severe exacerbation.  Responds well to osteopathic manipulation.  Discussed posture and ergonomics.  Discussed with patient to continue to stay active otherwise.  Patient will follow up with me again in 6 to 8 weeks  Patellofemoral syndrome of both knees Injection given today, tolerated the procedure well.  Patient has responded previously and hopefully well again.  Worsening pain could be a potential candidate for viscosupplementation.  Follow-up with me again in 6 to 8 weeks.  May need advanced imaging with patient's x-rays.  Only mild to moderate arthritic changes.   Nonallopathic problems  Decision today to treat with OMT was based on Physical Exam  After verbal consent patient was treated with HVLA, ME, FPR techniques in cervical, rib, thoracic, lumbar, and sacral  areas  Patient tolerated the procedure well with improvement in symptoms  Patient given exercises, stretches and  lifestyle modifications  See medications in patient instructions if given  Patient will follow up in 4-8 weeks      The above documentation has been reviewed and is accurate and complete Lyndal Pulley, DO       Note: This dictation was prepared with Dragon dictation along with smaller phrase technology. Any transcriptional errors that result from this process are unintentional.

## 2021-02-04 ENCOUNTER — Ambulatory Visit (INDEPENDENT_AMBULATORY_CARE_PROVIDER_SITE_OTHER): Payer: 59 | Admitting: Family Medicine

## 2021-02-04 ENCOUNTER — Encounter: Payer: Self-pay | Admitting: Family Medicine

## 2021-02-04 ENCOUNTER — Other Ambulatory Visit (HOSPITAL_COMMUNITY): Payer: Self-pay

## 2021-02-04 VITALS — BP 104/76 | HR 103 | Ht 60.0 in | Wt 143.0 lb

## 2021-02-04 DIAGNOSIS — G4486 Cervicogenic headache: Secondary | ICD-10-CM

## 2021-02-04 DIAGNOSIS — M9908 Segmental and somatic dysfunction of rib cage: Secondary | ICD-10-CM | POA: Diagnosis not present

## 2021-02-04 DIAGNOSIS — M9904 Segmental and somatic dysfunction of sacral region: Secondary | ICD-10-CM

## 2021-02-04 DIAGNOSIS — M222X2 Patellofemoral disorders, left knee: Secondary | ICD-10-CM | POA: Diagnosis not present

## 2021-02-04 DIAGNOSIS — M222X1 Patellofemoral disorders, right knee: Secondary | ICD-10-CM

## 2021-02-04 DIAGNOSIS — M9901 Segmental and somatic dysfunction of cervical region: Secondary | ICD-10-CM | POA: Diagnosis not present

## 2021-02-04 DIAGNOSIS — M9902 Segmental and somatic dysfunction of thoracic region: Secondary | ICD-10-CM | POA: Diagnosis not present

## 2021-02-04 DIAGNOSIS — M9903 Segmental and somatic dysfunction of lumbar region: Secondary | ICD-10-CM

## 2021-02-04 NOTE — Patient Instructions (Signed)
Knee injections today Gel injection if needed See me in 5-6 weeks

## 2021-02-04 NOTE — Assessment & Plan Note (Signed)
Chronic issues with severe exacerbation.  Responds well to osteopathic manipulation.  Discussed posture and ergonomics.  Discussed with patient to continue to stay active otherwise.  Patient will follow up with me again in 6 to 8 weeks

## 2021-02-04 NOTE — Assessment & Plan Note (Signed)
Injection given today, tolerated the procedure well.  Patient has responded previously and hopefully well again.  Worsening pain could be a potential candidate for viscosupplementation.  Follow-up with me again in 6 to 8 weeks.  May need advanced imaging with patient's x-rays.  Only mild to moderate arthritic changes.

## 2021-02-05 ENCOUNTER — Other Ambulatory Visit: Payer: Self-pay | Admitting: Internal Medicine

## 2021-02-05 ENCOUNTER — Other Ambulatory Visit (HOSPITAL_COMMUNITY): Payer: Self-pay

## 2021-02-05 ENCOUNTER — Telehealth: Payer: Self-pay

## 2021-02-05 DIAGNOSIS — E1165 Type 2 diabetes mellitus with hyperglycemia: Secondary | ICD-10-CM

## 2021-02-05 DIAGNOSIS — Z794 Long term (current) use of insulin: Secondary | ICD-10-CM

## 2021-02-05 MED ORDER — INSULIN GLARGINE 100 UNIT/ML SOLOSTAR PEN
28.0000 [IU] | PEN_INJECTOR | Freq: Every day | SUBCUTANEOUS | 1 refills | Status: DC
  Filled 2021-02-05: qty 24, 85d supply, fill #0
  Filled 2021-02-05: qty 30, fill #0
  Filled 2022-01-12 – 2022-01-14 (×3): qty 24, 85d supply, fill #1

## 2021-02-05 MED FILL — Insulin Aspart Soln Pen-injector 100 Unit/ML: SUBCUTANEOUS | 63 days supply | Qty: 15 | Fill #0 | Status: AC

## 2021-02-05 MED FILL — Insulin Pen Needle 32 G X 4 MM (1/6" or 5/32"): 50 days supply | Qty: 200 | Fill #0 | Status: CN

## 2021-02-05 MED FILL — Insulin Glargine Soln Pen-Injector 100 Unit/ML: SUBCUTANEOUS | 90 days supply | Qty: 30 | Fill #0 | Status: CN

## 2021-02-05 NOTE — Telephone Encounter (Signed)
New message    Pending  Rachel [name] (Key: [id number] help? Call us at [phone] Status Sent to [employer] The plan will fax you a determination, typically within 1 to 5 business days.  How do I follow up? Drug AJOVY (fremanezumab-vfrm) injection '225MG'$ /1.5ML auto-injectors Form [employer] Prior Authorization Request Form Prior Authorization for [employer] members [phone] [phone]

## 2021-02-09 ENCOUNTER — Other Ambulatory Visit (HOSPITAL_COMMUNITY): Payer: Self-pay

## 2021-02-09 ENCOUNTER — Other Ambulatory Visit: Payer: Self-pay

## 2021-02-09 MED ORDER — METOCLOPRAMIDE HCL 5 MG PO TABS
5.0000 mg | ORAL_TABLET | Freq: Three times a day (TID) | ORAL | 1 refills | Status: AC
  Filled 2021-02-09: qty 90, 30d supply, fill #0

## 2021-02-10 NOTE — Telephone Encounter (Signed)
F/u  Noticed from [place]  Notice of denial of prescription drug coverage  - Ajovy  '225mg'$ / 1.64m auto inject

## 2021-02-12 ENCOUNTER — Telehealth: Payer: Self-pay | Admitting: Neurology

## 2021-02-12 NOTE — Telephone Encounter (Signed)
Pt called back again regarding a sample. She said she needs a sample today, its the first of the month. She wants [place] to start another PA.

## 2021-02-12 NOTE — Telephone Encounter (Signed)
Advised pt, she can come by the office and pick up samples of Ajovy.

## 2021-02-12 NOTE — Telephone Encounter (Signed)
Pt states  she is needing to correct paperwork for a PA. Her ins denied her migraine shot due to lack of medical necessity. She is seeking a sample of meds for this month and a correction on her pre certification

## 2021-02-13 ENCOUNTER — Ambulatory Visit (INDEPENDENT_AMBULATORY_CARE_PROVIDER_SITE_OTHER): Payer: 59

## 2021-02-13 ENCOUNTER — Telehealth: Payer: Self-pay | Admitting: Neurology

## 2021-02-13 ENCOUNTER — Other Ambulatory Visit: Payer: Self-pay

## 2021-02-13 DIAGNOSIS — G43009 Migraine without aura, not intractable, without status migrainosus: Secondary | ICD-10-CM

## 2021-02-13 DIAGNOSIS — G43709 Chronic migraine without aura, not intractable, without status migrainosus: Secondary | ICD-10-CM

## 2021-02-13 MED ORDER — KETOROLAC TROMETHAMINE 60 MG/2ML IM SOLN
60.0000 mg | Freq: Once | INTRAMUSCULAR | Status: AC
  Administered 2021-02-13: 60 mg via INTRAMUSCULAR

## 2021-02-13 MED ORDER — METOCLOPRAMIDE HCL 5 MG/ML IJ SOLN
10.0000 mg | Freq: Once | INTRAMUSCULAR | Status: AC
  Administered 2021-02-13: 10 mg via INTRAMUSCULAR

## 2021-02-13 MED ORDER — DIPHENHYDRAMINE HCL 50 MG/ML IJ SOLN
25.0000 mg | Freq: Once | INTRAMUSCULAR | Status: AC
  Administered 2021-02-13: 25 mg via INTRAMUSCULAR

## 2021-02-13 MED ORDER — AJOVY 225 MG/1.5ML ~~LOC~~ SOAJ: 225.0000 mg | SUBCUTANEOUS | 0 refills | Status: DC

## 2021-02-13 NOTE — Telephone Encounter (Signed)
Pt is coming today to get samples around 03-1029. She said she is hurting very bad and cant take it. She is wondering if she can get a cocktail when she gets her samples.

## 2021-02-13 NOTE — Telephone Encounter (Signed)
Pt pick up sample and given a headache cocktail.

## 2021-02-17 ENCOUNTER — Other Ambulatory Visit (HOSPITAL_COMMUNITY): Payer: Self-pay

## 2021-02-18 ENCOUNTER — Other Ambulatory Visit (HOSPITAL_COMMUNITY): Payer: Self-pay

## 2021-02-19 ENCOUNTER — Other Ambulatory Visit (HOSPITAL_COMMUNITY): Payer: Self-pay

## 2021-02-20 NOTE — Telephone Encounter (Signed)
F/u   Resubmit via fax to [place] on prior authorization   Medication Ajovy.

## 2021-02-25 ENCOUNTER — Other Ambulatory Visit (HOSPITAL_COMMUNITY): Payer: Self-pay

## 2021-03-02 ENCOUNTER — Ambulatory Visit: Payer: 59 | Admitting: Internal Medicine

## 2021-03-04 ENCOUNTER — Ambulatory Visit: Payer: 59 | Admitting: Family Medicine

## 2021-03-04 NOTE — Progress Notes (Deleted)
[facility] [place] [employer] Phone: [phone] Subjective:    I'm seeing this patient by the request  of:  Biagio Borg, MD  CC:   [id number]  Rachel [name] is a 55 y.o. female coming in with complaint of back and neck pain. OMT on 02/04/2021. Patient states   Medications patient has been prescribed: None  Taking:         Reviewed prior external information including notes and imaging from previsou exam, outside providers and external EMR if available.   As well as notes that were available from care everywhere and other healthcare systems.  Past medical history, social, surgical and family history all reviewed in electronic medical record.  No pertanent information unless stated regarding to the chief complaint.   Past Medical History:  Diagnosis Date   ALLERGIC RHINITIS 10/04/2007   Anemia 01/21/2011   Anxiety 11/15/2018   ASTHMA 08/02/2007   INHALERS ONLY IN [place]   Asthma 08/02/2007   Qualifier: Diagnosis of  By: Wynona Luna    Blood transfusion without reported diagnosis    with first child    Cervical radiculopathy 01/21/2011   Cervicogenic headache 04/27/2017   COMMON MIGRAINE 05/15/2009   Depression 01/09/2010   Qualifier: Diagnosis of  By: Jenny Reichmann MD, [place], TYPE II 08/02/2007   Dysphagia, pharyngoesophageal phase 06/12/2014   GERD 08/02/2007   HYPERLIPIDEMIA 08/02/2007   Hyperlipidemia 08/02/2007   Qualifier: Diagnosis of  By: Wynona Luna    INSOMNIA-SLEEP [place] 01/04/2008   INTERMITTENT VERTIGO 05/15/2009   LIBIDO, DECREASED 01/09/2010   Migraine without aura 05/15/2009   Qualifier: Diagnosis of  By: Jenny Reichmann MD, [place]    Nonallopathic lesion of rib cage 12/21/2017   Nonallopathic lesion of sacral region 07/29/2015   Nonallopathic lesion of thoracic region 07/29/2015   Osteoporosis 04/25/2019   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   Rheumatoid factor  positive 02/10/2016   Right knee pain 01/31/2019   Injected January 31, 2019   Type 2 diabetes mellitus with hyperglycemia, with long-term current use of insulin ([place]) 08/02/2007   Qualifier: Diagnosis of  By: Wynona Luna     Allergies  Allergen Reactions   Lipitor [Atorvastatin]     REACTION: myalgias   Nitroglycerin      Review of Systems:  No headache, visual changes, nausea, vomiting, diarrhea, constipation, dizziness, abdominal pain, skin rash, fevers, chills, night sweats, weight loss, swollen lymph nodes, body aches, joint swelling, chest pain, shortness of breath, mood changes. POSITIVE muscle aches  Objective  There were no vitals taken for this visit.   General: No apparent distress alert and oriented x3 mood and affect normal, dressed appropriately.  HEENT: Pupils equal, extraocular movements intact  Respiratory: Patient's speak in full sentences and does not appear short of breath  Cardiovascular: No lower extremity edema, non tender, no erythema  Neuro: Cranial nerves II through XII are intact, neurovascularly intact in all extremities with 2+ DTRs and 2+ pulses.  Gait normal with good balance and coordination.  MSK:  Non tender with full range of motion and good stability and symmetric strength and tone of shoulders, elbows, wrist, hip, knee and ankles bilaterally.  Back - Normal skin, Spine with normal alignment and no deformity.  No tenderness to vertebral process palpation.  Paraspinous muscles are not tender and without spasm.   Range of motion is full at neck and  lumbar sacral regions  Osteopathic findings  C2 flexed rotated and side bent right C6 flexed rotated and side bent left T3 extended rotated and side bent right inhaled rib T9 extended rotated and side bent left L2 flexed rotated and side bent right Sacrum right on right       Assessment and Plan:    Nonallopathic problems  Decision today to treat with OMT was based on Physical  Exam  After verbal consent patient was treated with HVLA, ME, FPR techniques in cervical, rib, thoracic, lumbar, and sacral  areas  Patient tolerated the procedure well with improvement in symptoms  Patient given exercises, stretches and lifestyle modifications  See medications in patient instructions if given  Patient will follow up in 4-8 weeks      The above documentation has been reviewed and is accurate and complete Belva Agee       Note: This dictation was prepared with Diplomatic Services operational officer dictation along with smaller Company secretary. Any transcriptional errors that result from this process are unintentional.

## 2021-03-16 ENCOUNTER — Ambulatory Visit (INDEPENDENT_AMBULATORY_CARE_PROVIDER_SITE_OTHER): Payer: 59 | Admitting: Internal Medicine

## 2021-03-16 ENCOUNTER — Ambulatory Visit: Payer: 59 | Admitting: Neurology

## 2021-03-16 ENCOUNTER — Other Ambulatory Visit (HOSPITAL_COMMUNITY): Payer: Self-pay

## 2021-03-16 ENCOUNTER — Encounter: Payer: Self-pay | Admitting: Internal Medicine

## 2021-03-16 ENCOUNTER — Other Ambulatory Visit: Payer: Self-pay

## 2021-03-16 VITALS — BP 110/60 | HR 91 | Temp 98.4°F | Ht 60.0 in | Wt 143.0 lb

## 2021-03-16 DIAGNOSIS — Z794 Long term (current) use of insulin: Secondary | ICD-10-CM

## 2021-03-16 DIAGNOSIS — E1165 Type 2 diabetes mellitus with hyperglycemia: Secondary | ICD-10-CM | POA: Diagnosis not present

## 2021-03-16 DIAGNOSIS — E782 Mixed hyperlipidemia: Secondary | ICD-10-CM

## 2021-03-16 DIAGNOSIS — K219 Gastro-esophageal reflux disease without esophagitis: Secondary | ICD-10-CM | POA: Diagnosis not present

## 2021-03-16 DIAGNOSIS — J4520 Mild intermittent asthma, uncomplicated: Secondary | ICD-10-CM | POA: Diagnosis not present

## 2021-03-16 DIAGNOSIS — F32A Depression, unspecified: Secondary | ICD-10-CM

## 2021-03-16 MED ORDER — OMEPRAZOLE 40 MG PO CPDR
40.0000 mg | DELAYED_RELEASE_CAPSULE | Freq: Every day | ORAL | 1 refills | Status: DC
  Filled 2021-03-16: qty 90, 90d supply, fill #0
  Filled 2021-05-22: qty 90, 90d supply, fill #1
  Filled 2021-08-09: qty 90, 90d supply, fill #2

## 2021-03-16 NOTE — Assessment & Plan Note (Signed)
Lab Results  Component Value Date   LDLCALC 151 (H) 08/28/2020   Stable, pt to continue current dm diet, declines statin , has been intolerant lipitor

## 2021-03-16 NOTE — Assessment & Plan Note (Signed)
Subjectively uncontrolled, for prilosec bid

## 2021-03-16 NOTE — Assessment & Plan Note (Signed)
Stable overall, cont current tx - no med for now

## 2021-03-16 NOTE — Assessment & Plan Note (Signed)
Stable, cont inhaler prn 

## 2021-03-16 NOTE — Assessment & Plan Note (Signed)
Lab Results  Component Value Date   HGBA1C 8.3 (H) 12/30/2020   Stable, pt to continue current medical treatment novolog, lantus, f/u Endo

## 2021-03-16 NOTE — Patient Instructions (Addendum)
Ok to continue the prilosec at twice per day if ok with the insurance  Please continue all other medications as before, and refills have been done if requested.  Please have the pharmacy call with any other refills you may need.  Please continue your efforts at being more active, low cholesterol diet, and weight control.  Please keep your appointments with your specialists as you may have planned - Dr Loletha Carrow later this, and Endocrinology soon, and Dr [name] as well  Please make an Appointment to return in 6 months, or sooner if needed

## 2021-03-16 NOTE — Progress Notes (Signed)
Patient ID: Rachel [name], female   DOB: [birth year], 55 y.o.   MRN: [id number]        Chief Complaint: follow up HLD and hyperglycemia , gastritis       HPI:  Rachel [name] is a 55 y.o. female here with c/o recent gastric emptying study abnormal, has f/u with GI later this wk.  Insurnce not covering priloec 40 bid but asks for rx again then Prior auth pursue due to persistent gerd and pepcid not helping.  Had cortisone to both knees < 1 mo, today with a few days increased right knee pain, likley to have the gel shots next.  Tends to want to giveaway but no falls.  Pt denies chest pain, increased sob or doe, wheezing, orthopnea, PND, increased LE swelling, palpitations, dizziness or syncope.   Pt denies polydipsia, polyuria, or new focal neuro s/s   Wt Readings from Last 3 Encounters:  03/16/21 143 lb (64.9 kg)  02/04/21 143 lb (64.9 kg)  01/19/21 139 lb (63 kg)   BP Readings from Last 3 Encounters:  03/16/21 110/60  02/04/21 104/76  01/19/21 100/73         Past Medical History:  Diagnosis Date   ALLERGIC RHINITIS 10/04/2007   Anemia 01/21/2011   Anxiety 11/15/2018   ASTHMA 08/02/2007   INHALERS ONLY IN [place]   Asthma 08/02/2007   Qualifier: Diagnosis of  By: Wynona Luna    Blood transfusion without reported diagnosis    with first child    Cervical radiculopathy 01/21/2011   Cervicogenic headache 04/27/2017   COMMON MIGRAINE 05/15/2009   Depression 01/09/2010   Qualifier: Diagnosis of  By: Jenny Reichmann MD, [place], TYPE II 08/02/2007   Dysphagia, pharyngoesophageal phase 06/12/2014   GERD 08/02/2007   HYPERLIPIDEMIA 08/02/2007   Hyperlipidemia 08/02/2007   Qualifier: Diagnosis of  By: Wynona Luna    INSOMNIA-SLEEP [place] 01/04/2008   INTERMITTENT VERTIGO 05/15/2009   LIBIDO, DECREASED 01/09/2010   Migraine without aura 05/15/2009   Qualifier: Diagnosis of  By: Jenny Reichmann MD, [place]    Nonallopathic lesion of rib cage 12/21/2017   Nonallopathic lesion of  sacral region 07/29/2015   Nonallopathic lesion of thoracic region 07/29/2015   Osteoporosis 04/25/2019   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   Rheumatoid factor positive 02/10/2016   Right knee pain 01/31/2019   Injected January 31, 2019   Type 2 diabetes mellitus with hyperglycemia, with long-term current use of insulin ([place]) 08/02/2007   Qualifier: Diagnosis of  By: Wynona Luna    Past Surgical History:  Procedure Laterality Date   CESAREAN SECTION     x 3   CYST EXCISION     left knee   PAROTIDECTOMY  10/21/2011   Procedure: PAROTIDECTOMY;  Surgeon: Melida Quitter, MD;  Location: [facility];  Service: ENT;  Laterality: Left;    reports that she has never smoked. She has never used smokeless tobacco. She reports that she does not drink alcohol and does not use drugs. family history includes Asthma in her father; Cervical cancer in her maternal aunt; Diabetes in her father; Heart disease in her maternal aunt; Hypertension in her father. Allergies  Allergen Reactions   Lipitor [Atorvastatin]     REACTION: myalgias   Nitroglycerin    Current Outpatient Medications on File Prior to Visit  Medication Sig Dispense Refill   aspirin 81 MG EC tablet Take 81 mg by mouth daily.  butalbital-acetaminophen-caffeine (FIORICET) 50-325-40 MG tablet Take 1 tablet by mouth every 6 (six) hours as needed for headache. Max 2 tablets per day 30 tablet 3   Cholecalciferol (VITAMIN D) 50 MCG (2000 UT) CAPS Take 4,000 Units by mouth daily.     cyclobenzaprine (FLEXERIL) 5 MG tablet TAKE 1 TABLET BY MOUTH THREE TIMES DAILY AS NEEDED FOR MUSCLE SPASMS. 90 tablet 1   Diclofenac Sodium (PENNSAID) 2 % SOLN Place 2 g onto the skin 2 (two) times daily. 112 g 3   DULoxetine (CYMBALTA) 60 MG capsule Take 60 mg by mouth daily.     Fremanezumab-vfrm (AJOVY) 225 MG/1.5ML SOAJ Inject 225 mg into the skin every 28 (twenty-eight) days. 1.5 mL 5   Fremanezumab-vfrm (AJOVY) 225 MG/1.5ML SOAJ Inject 225 mg into  the skin every 28 (twenty-eight) days. 1.5 mL 0   gabapentin (NEURONTIN) 300 MG capsule TAKE 2 CAPSULES BY MOUTH 2 TIMES DAILY 120 capsule 5   glucose blood (ACCU-CHEK GUIDE) test strip Use as instructed 2x a day 200 each 12   insulin aspart (NOVOLOG) 100 UNIT/ML FlexPen INJECT 5 TO 8 UNITS INTO THE SKIN THREE TIMES DAILY BEFORE MEALS. 15 mL 5   insulin glargine (LANTUS) 100 UNIT/ML Solostar Pen INJECT 32 UNITS INTO THE SKIN DAILY. 30 mL 1   insulin glargine (LANTUS) 100 UNIT/ML Solostar Pen Inject 28 Units into the skin daily. 30 mL 1   Insulin Pen Needle 32G X 4 MM MISC USE AS DIRECTED. 200 each 3   Insulin Regular Human (NOVOLIN R FLEXPEN) 100 UNIT/ML SOPN inject 36 units daily     Lancets (FREESTYLE) lancets USE 1 FOUR TIMES DAILY AS DIRECTED. (Patient taking differently: USE 1 FOUR TIMES DAILY AS DIRECTED.) 400 each 12   Lasmiditan Succinate (REYVOW) 100 MG TABS Take 100 mg by mouth as needed. Take 1 tablet for headache. No more than 1 tablet in 24 hours 8 tablet 3   linaclotide (LINZESS) 145 MCG CAPS capsule TAKE 1 CAPSULE (145 MCG TOTAL) BY MOUTH DAILY BEFORE BREAKFAST. 30 capsule 2   metoCLOPramide (REGLAN) 5 MG tablet Take 1 tablet (5 mg total) by mouth 3 (three) times daily 30 minutes before meals 90 tablet 1   Multiple Vitamin (MULTIVITAMIN WITH MINERALS) TABS tablet Take 1 tablet by mouth daily.     ondansetron (ZOFRAN ODT) 8 MG disintegrating tablet Take 1 tablet (8 mg total) by mouth every 8 (eight) hours as needed for nausea or vomiting. 30 tablet 0   ondansetron (ZOFRAN) 4 MG tablet TAKE 1 TABLET BY MOUTH EVERY 8 HOURS AS NEEDED FOR NAUSEA OR VOMITING. 40 tablet 1   Semaglutide,0.25 or 0.5MG /DOS, 2 MG/1.5ML SOPN INJECT 0.5 MG INTO THE SKIN ONCE A WEEK. 4.5 mL 3   triamcinolone (NASACORT) 55 MCG/ACT AERO nasal inhaler Place 2 sprays into the nose daily. 1 Inhaler 12   albuterol (VENTOLIN HFA) 108 (90 Base) MCG/ACT inhaler INHALE 2 PUFFS INTO THE LUNGS EVERY 6 (SIX) HOURS AS NEEDED FOR  WHEEZING OR SHORTNESS OF BREATH. 18 g 5   No current facility-administered medications on file prior to visit.        ROS:  All others reviewed and negative.  Objective        PE:  BP 110/60 (BP Location: Left Arm, Patient Position: Sitting, Cuff Size: Normal)   Pulse 91   Temp 98.4 F (36.9 C) (Oral)   Ht 5' (1.524 m)   Wt 143 lb (64.9 kg)   SpO2 98%   BMI  27.93 kg/m                 Constitutional: Pt appears in NAD               HENT: Head: NCAT.                Right Ear: External ear normal.                 Left Ear: External ear normal.                Eyes: . Pupils are equal, round, and reactive to light. Conjunctivae and EOM are normal               Nose: without d/c or deformity               Neck: Neck supple. Gross normal ROM               Cardiovascular: Normal rate and regular rhythm.                 Pulmonary/Chest: Effort normal and breath sounds without rales or wheezing.                Abd:  Soft, NT, ND, + BS, no organomegaly               Neurological: Pt is alert. At baseline orientation, motor grossly intact               Skin: Skin is warm. No rashes, no other new lesions, LE edema - none               Psychiatric: Pt behavior is normal without agitation   Micro: none  Cardiac tracings I have personally interpreted today:  none  Pertinent Radiological findings (summarize): none   Lab Results  Component Value Date   WBC 8.2 12/30/2020   HGB 11.6 (L) 12/30/2020   HCT 35.3 (L) 12/30/2020   PLT 410.0 (H) 12/30/2020   GLUCOSE 110 (H) 12/30/2020   CHOL 229 (H) 08/28/2020   TRIG 103.0 08/28/2020   HDL 57.10 08/28/2020   LDLDIRECT 191.0 08/23/2018   LDLCALC 151 (H) 08/28/2020   ALT 12 12/30/2020   AST 17 12/30/2020   NA 135 12/30/2020   K 3.9 12/30/2020   CL 101 12/30/2020   CREATININE 0.72 12/30/2020   BUN 11 12/30/2020   CO2 28 12/30/2020   TSH 1.07 08/28/2020   INR 1.08 07/23/2009   HGBA1C 8.3 (H) 12/30/2020   MICROALBUR <0.7 08/28/2020    Assessment/Plan:  Rekia Kujala is a 55 y.o. Other or two or more races [6] female with  has a past medical history of ALLERGIC RHINITIS (10/04/2007), Anemia (01/21/2011), Anxiety (11/15/2018), ASTHMA (08/02/2007), Asthma (08/02/2007), Blood transfusion without reported diagnosis, Cervical radiculopathy (01/21/2011), Cervicogenic headache (04/27/2017), COMMON MIGRAINE (05/15/2009), Depression (01/09/2010), DIABETES MELLITUS, TYPE II (08/02/2007), Dysphagia, pharyngoesophageal phase (06/12/2014), GERD (08/02/2007), HYPERLIPIDEMIA (08/02/2007), Hyperlipidemia (08/02/2007), INSOMNIA-SLEEP DISORDER-UNSPEC (01/04/2008), INTERMITTENT VERTIGO (05/15/2009), LIBIDO, DECREASED (01/09/2010), Migraine without aura (05/15/2009), Nonallopathic lesion of rib cage (12/21/2017), Nonallopathic lesion of sacral region (07/29/2015), Nonallopathic lesion of thoracic region (07/29/2015), Osteoporosis (04/25/2019), Patellofemoral syndrome of both knees (04/25/2019), Rheumatoid factor positive (02/10/2016), Right knee pain (01/31/2019), and Type 2 diabetes mellitus with hyperglycemia, with long-term current use of insulin ([place]) (08/02/2007).  Type 2 diabetes mellitus with hyperglycemia, with long-term current use of insulin ([facility]) Lab Results  Component Value Date   HGBA1C 8.3 (H) 12/30/2020   Stable, pt  to continue current medical treatment novolog, lantus, f/u Endo   Hyperlipidemia Lab Results  Component Value Date   LDLCALC 151 (H) 08/28/2020   Stable, pt to continue current dm diet, declines statin , has been intolerant lipitor    Depression Stable overall, cont current tx - no med for now   Asthma Stable, cont inhaler prn  GERD Subjectively uncontrolled, for prilosec bid  Followup: Return in about 6 months (around 09/14/2021).  Cathlean Cower, MD 03/16/2021 10:13 PM [facility]

## 2021-03-17 ENCOUNTER — Telehealth: Payer: Self-pay | Admitting: Internal Medicine

## 2021-03-17 ENCOUNTER — Other Ambulatory Visit (HOSPITAL_COMMUNITY): Payer: Self-pay

## 2021-03-17 NOTE — Telephone Encounter (Signed)
Patient has called concerning her acid reflux medication. Stated she was not aware of any changes during her appt yesterday but when she went to the pharmacy it was a new one given to her.   Please advise

## 2021-03-18 ENCOUNTER — Ambulatory Visit (INDEPENDENT_AMBULATORY_CARE_PROVIDER_SITE_OTHER): Payer: 59 | Admitting: Gastroenterology

## 2021-03-18 ENCOUNTER — Encounter: Payer: Self-pay | Admitting: Gastroenterology

## 2021-03-18 VITALS — BP 104/70 | HR 88 | Ht 61.0 in | Wt 141.2 lb

## 2021-03-18 DIAGNOSIS — K3184 Gastroparesis: Secondary | ICD-10-CM | POA: Diagnosis not present

## 2021-03-18 DIAGNOSIS — R140 Abdominal distension (gaseous): Secondary | ICD-10-CM

## 2021-03-18 DIAGNOSIS — R110 Nausea: Secondary | ICD-10-CM

## 2021-03-18 DIAGNOSIS — R6881 Early satiety: Secondary | ICD-10-CM | POA: Diagnosis not present

## 2021-03-18 MED FILL — Linaclotide Cap 145 MCG: ORAL | 30 days supply | Qty: 30 | Fill #0 | Status: AC

## 2021-03-18 NOTE — Progress Notes (Signed)
[facility] Progress Note  Chief Complaint: Gastroparesis  Subjective  History: Rachel [name] follows up for her upper abdominal pain, nausea vomiting and reflux symptoms.  I saw her in the office August 5 with extensive clinical details outlined in that note, and then she had upper endoscopy August 8 with findings noted below. Subsequent GES showed delayed emptying, patient started on metoclopramide 5 mg three times daily with meals.  Harry says she is feeling somewhat better than when I last saw her.  She took Reglan for about 5 days, but then stopped because it made her feel "jittery".  She reports having called the office and spoken to someone and was told she should take Benadryl along with the medication, but I cannot find any chart notes to that effect.  She says she started taking the Reglan with the Benadryl from the first dose.  I also reminded her she had taken metoclopramide in the past (probably prescribed by primary care based on records), and did not appear to have any problems with that.  Nevertheless, she has been off the medication since then, has early satiety and does not feel hungry most of the day.  She has intermittent nausea and bloating but no more vomiting.  Glucose is also lately been under better control by report, with highest glucose about 130 in the last several weeks.  She is due to see endocrinology soon.   ROS: Cardiovascular:  no chest pain Respiratory: no dyspnea Fatigue Remainder systems negative except as above The patient's Past Medical, Family and Social History were reviewed and are on file in the EMR.  Objective:  Med list reviewed  Current Outpatient Medications:    albuterol (VENTOLIN HFA) 108 (90 Base) MCG/ACT inhaler, INHALE 2 PUFFS INTO THE LUNGS EVERY 6 (SIX) HOURS AS NEEDED FOR WHEEZING OR SHORTNESS OF BREATH., Disp: 18 g, Rfl: 5   aspirin 81 MG EC tablet, Take 81 mg by mouth daily., Disp: , Rfl:    butalbital-acetaminophen-caffeine  (FIORICET) 50-325-40 MG tablet, Take 1 tablet by mouth every 6 (six) hours as needed for headache. Max 2 tablets per day, Disp: 30 tablet, Rfl: 3   Cholecalciferol (VITAMIN D) 50 MCG (2000 UT) CAPS, Take 4,000 Units by mouth daily., Disp: , Rfl:    cyclobenzaprine (FLEXERIL) 5 MG tablet, TAKE 1 TABLET BY MOUTH THREE TIMES DAILY AS NEEDED FOR MUSCLE SPASMS., Disp: 90 tablet, Rfl: 1   Diclofenac Sodium (PENNSAID) 2 % SOLN, Place 2 g onto the skin 2 (two) times daily., Disp: 112 g, Rfl: 3   DULoxetine (CYMBALTA) 60 MG capsule, Take 60 mg by mouth daily., Disp: , Rfl:    Fremanezumab-vfrm (AJOVY) 225 MG/1.5ML SOAJ, Inject 225 mg into the skin every 28 (twenty-eight) days., Disp: 1.5 mL, Rfl: 5   gabapentin (NEURONTIN) 300 MG capsule, TAKE 2 CAPSULES BY MOUTH 2 TIMES DAILY, Disp: 120 capsule, Rfl: 5   glucose blood (ACCU-CHEK GUIDE) test strip, Use as instructed 2x a day, Disp: 200 each, Rfl: 12   insulin aspart (NOVOLOG) 100 UNIT/ML FlexPen, INJECT 5 TO 8 UNITS INTO THE SKIN THREE TIMES DAILY BEFORE MEALS., Disp: 15 mL, Rfl: 5   insulin glargine (LANTUS) 100 UNIT/ML Solostar Pen, Inject 28 Units into the skin daily., Disp: 30 mL, Rfl: 1   Insulin Pen Needle 32G X 4 MM MISC, USE AS DIRECTED., Disp: 200 each, Rfl: 3   Lancets (FREESTYLE) lancets, USE 1 FOUR TIMES DAILY AS DIRECTED. (Patient taking differently: USE 1 FOUR TIMES  DAILY AS DIRECTED.), Disp: 400 each, Rfl: 12   Lasmiditan Succinate (REYVOW) 100 MG TABS, Take 100 mg by mouth as needed. Take 1 tablet for headache. No more than 1 tablet in 24 hours, Disp: 8 tablet, Rfl: 3   linaclotide (LINZESS) 145 MCG CAPS capsule, TAKE 1 CAPSULE (145 MCG TOTAL) BY MOUTH DAILY BEFORE BREAKFAST., Disp: 30 capsule, Rfl: 2   metoCLOPramide (REGLAN) 5 MG tablet, Take 1 tablet (5 mg total) by mouth 3 (three) times daily 30 minutes before meals, Disp: 90 tablet, Rfl: 1   Multiple Vitamin (MULTIVITAMIN WITH MINERALS) TABS tablet, Take 1 tablet by mouth daily., Disp: ,  Rfl:    omeprazole (PRILOSEC) 40 MG capsule, Take 1 capsule (40 mg total) by mouth daily., Disp: 180 capsule, Rfl: 1   ondansetron (ZOFRAN ODT) 8 MG disintegrating tablet, Take 1 tablet (8 mg total) by mouth every 8 (eight) hours as needed for nausea or vomiting., Disp: 30 tablet, Rfl: 0   Semaglutide,0.25 or 0.5MG /DOS, 2 MG/1.5ML SOPN, INJECT 0.5 MG INTO THE SKIN ONCE A WEEK., Disp: 4.5 mL, Rfl: 3   triamcinolone (NASACORT) 55 MCG/ACT AERO nasal inhaler, Place 2 sprays into the nose daily., Disp: 1 Inhaler, Rfl: 12   Vital signs in last 24 hrs: Vitals:   03/18/21 1056  BP: 104/70  Pulse: 88   Wt Readings from Last 3 Encounters:  03/18/21 141 lb 4 oz (64.1 kg)  03/16/21 143 lb (64.9 kg)  02/04/21 143 lb (64.9 kg)    Physical Exam  Well-appearing HEENT: sclera anicteric, oral mucosa moist without lesions Neck: supple, no thyromegaly, JVD or lymphadenopathy Cardiac: RRR without murmurs, S1S2 heard, no peripheral edema Pulm: clear to auscultation bilaterally, normal RR and effort noted Abdomen: soft, no tenderness, with active bowel sounds. No guarding or palpable hepatosplenomegaly. Skin; warm and dry, no jaundice or rash  Labs:   ___________________________________________ Radiologic studies: Recent gastric emptying study  CLINICAL DATA:  Early satiety, nausea, vomiting, hiatal hernia, gastritis, GERD   EXAM: NUCLEAR MEDICINE GASTRIC EMPTYING SCAN   TECHNIQUE: After oral ingestion of radiolabeled meal, sequential abdominal images were obtained for 4 hours. Percentage of activity emptying the stomach was calculated at 1 hour, 2 hour, 3 hour, and 4 hours.   RADIOPHARMACEUTICALS:  2.0 mCi Tc-44m sulfur colloid in standardized meal   COMPARISON:  None.   FINDINGS: Expected location of the stomach in the left upper quadrant. Ingested meal empties the stomach gradually over the course of the study.   12% emptied at 1 hr ( normal >= 10%)   30% emptied at 2 hr (  normal >= 40%)   43% emptied at 3 hr ( normal >= 70%)   56% emptied at 4 hr ( normal >= 90%)   IMPRESSION: Delayed gastric emptying study.   ____________________________________________ Other: EGD findings  The larynx was normal. - A 4 cm hiatal hernia was present. (EGJ 31cm from teeth). The hernia is causing mild distal esophageal tortuosity. - There is no endoscopic evidence of Barrett's esophagus, esophagitis or stricture in the entire esophagus. - Striped mildly erythematous mucosa was found in the prepyloric region of the stomach. Several biopsies were obtained on the greater curvature of the gastric body, on the lesser curvature of the gastric body, on the greater curvature of the gastric antrum and on the lesser curvature of the gastric antrum with cold forceps for histology. - Multiple small sessile fundic gland polyps were found in the gastric body. One such small polyp in the  distal gastric body had a (possibly) adenomatous appearance Biopsies were taken with a cold forceps for histology (entire polyp removed). - The exam of the stomach was otherwise normal. - The examined duodenum was normal.   Pathology:  1. Surgical [P], random gastric - mid gastritis - ANTRAL AND OXYNTIC MUCOSA WITH SLIGHT CHRONIC INFLAMMATION. [place] NEGATIVE FOR HELICOBACTER PYLORI. - NO INTESTINAL METAPLASIA, DYSPLASIA OR CARCINOMA. 2. Surgical [P], gastric polyp - HYPERPLASTIC GASTRIC POLYP. - NO INTESTINAL METAPLASIA, ADENOMATOUS CHANGE OR CARCINOMA.  _____________________________________________ Assessment & Plan  Assessment: Encounter Diagnoses  Name Primary?   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  We discussed this condition and its available treatments. Some written dietary advice was given, which really should be the first-line mainstay of treatment. However,  given the severity of her symptoms before, I felt that prokinetic therapy was warranted.  The symptoms are now somewhat improved without the medication, in particular she is no longer vomiting or having nocturnal severe regurgitation as before.  It is not clear if the reported side effect was from the metoclopramide or because she took it along with Benadryl.  I recommended she try the metoclopramide without Benadryl for the next several days and see if she got the same side effects.  If so, she should continue the metoclopramide indefinitely. Either way, I asked her to keep in touch with me by portal message when necessary, and no longer than 2 to 3 weeks from now with an update.  I have mentioned the possibility of domperidone as being available through [facility].  She mentioned that her husband was traveling to the [place] soon and some medicines might be available there that are not available here.  I do not know the answer that, nor could I speak to the quality and safety of that medication.  For symptoms can be managed with diet alone, then so much the better.  If she tolerates metoclopramide, then lowest effective dose to control symptoms and maintain good blood sugar control is the goal. Plan: In addition to the above, she will see me in 2 to 3 months.  31 minutes were spent on this encounter (including chart review, history/exam, counseling/coordination of care, and documentation) > 50% of that time was spent on counseling and coordination of care.   Nelida Meuse III

## 2021-03-18 NOTE — Progress Notes (Signed)
[facility] [place] Phone: [phone] Subjective:   [name], am serving as a scribe for Dr. Hulan Saas. This visit occurred during the SARS-CoV-2 public health emergency.  Safety protocols were in place, including screening questions prior to the visit, additional usage of staff PPE, and extensive cleaning of exam room while observing appropriate contact time as indicated for disinfecting solutions.   I'm seeing this patient by the request  of:  Biagio Borg, MD  CC: Knee pain follow-up back pain follow-up  MBT:[id number]  Shakelia Scrivner is a 55 y.o. female coming in with complaint of back and neck pain. OMT on 02/04/2021. Also bilateral knee pain Injection given today, tolerated the procedure well.  Patient has responded previously and hopefully well again.  Worsening pain could be a potential candidate for viscosupplementation.  Follow-up with me again in 6 to 8 weeks.  May need advanced imaging with patient's x-rays.  Only mild to moderate arthritic changes. Patient states that B knees have been bothersome since the change in weather. Using brace on R knee for pain relief.   Neck pain is tight and painful from scapula to base of skull since last visit.   Medications patient has been prescribed: None          Reviewed prior external information including notes and imaging from previsou exam, outside providers and external EMR if available.  This includes patient's most recent visits with GI for gastroparesis, on primary care referral GERD and type 2 diabetes as well as neurology for her chronic migraines.  These are all office visits since we saw her on August 24  As well as notes that were available from care everywhere and other healthcare systems.  Past medical history, social, surgical and family history all reviewed in electronic medical record.  No pertanent information unless stated regarding to the  chief complaint.   Past Medical History:  Diagnosis Date   ALLERGIC RHINITIS 10/04/2007   Anemia 01/21/2011   Anxiety 11/15/2018   ASTHMA 08/02/2007   INHALERS ONLY IN [place]   Asthma 08/02/2007   Qualifier: Diagnosis of  By: Wynona Luna    Blood transfusion without reported diagnosis    with first child    Cervical radiculopathy 01/21/2011   Cervicogenic headache 04/27/2017   COMMON MIGRAINE 05/15/2009   Depression 01/09/2010   Qualifier: Diagnosis of  By: Jenny Reichmann MD, [place], TYPE II 08/02/2007   Dysphagia, pharyngoesophageal phase 06/12/2014   GERD 08/02/2007   HYPERLIPIDEMIA 08/02/2007   Hyperlipidemia 08/02/2007   Qualifier: Diagnosis of  By: Wynona Luna    INSOMNIA-SLEEP [place] 01/04/2008   INTERMITTENT VERTIGO 05/15/2009   LIBIDO, DECREASED 01/09/2010   Migraine without aura 05/15/2009   Qualifier: Diagnosis of  By: Jenny Reichmann MD, [place]    Nonallopathic lesion of rib cage 12/21/2017   Nonallopathic lesion of sacral region 07/29/2015   Nonallopathic lesion of thoracic region 07/29/2015   Osteoporosis 04/25/2019   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   Rheumatoid factor positive 02/10/2016   Right knee pain 01/31/2019   Injected January 31, 2019   Type 2 diabetes mellitus with hyperglycemia, with long-term current use of insulin ([place]) 08/02/2007   Qualifier: Diagnosis of  By: Wynona Luna     Allergies  Allergen Reactions   Lipitor [Atorvastatin]     REACTION: myalgias   Nitroglycerin  Review of Systems:  No headache, visual changes, nausea, vomiting, diarrhea, constipation, dizziness, abdominal pain, skin rash, fevers, chills, night sweats, weight loss, swollen lymph nodes, body aches, joint swelling, chest pain, shortness of breath, mood changes. POSITIVE muscle aches  Objective  Blood pressure 102/72, pulse (!) 101, height 5\' 1"  (1.549 m), weight 141 lb (64 kg), SpO2 97 %.   General: No apparent distress alert and oriented x3  mood and affect normal, dressed appropriately.  HEENT: Pupils equal, extraocular movements intact  Respiratory: Patient's speak in full sentences and does not appear short of breath  Cardiovascular: No lower extremity edema, non tender, no erythema  Neck exam shows significant stiffness with some voluntary guarding noted.  Patient has significant pain in the occipital area as well as in the parascapular region bilaterally. Knee exam ttp diffusely patient does have decent range of motion at the moment.  Osteopathic findings  C2 flexed rotated and side bent right C5 flexed rotated and side bent left T3 extended rotated and side bent right inhaled rib T6 extended rotated and side bent left L2 flexed rotated and side bent right Sacrum right on right       Assessment and Plan:  Cervicogenic headache Chronic problem with exacerbation.  I do believe that this is more secondary to significant underlying inflammation.  The patient is taking the duloxetine.  He still having difficulty.  Chronic problem with exacerbation.  Has had significant work-up that did not show anything else.  Seems to be more polyarthralgia with chronic pain.  Follow-up with me again in 6 to 8 weeks  Patellofemoral syndrome of both knees Has done relatively well with the injection, with light potential viscosupplementation.  Still has not heard back from primary insurance that if this will be covered or not.  Discussed which activities to do which wants to avoid.  Increase activity slowly.  Follow-up again in 6 to 8 weeks   Nonallopathic problems  Decision today to treat with OMT was based on Physical Exam  After verbal consent patient was treated with HVLA, ME, FPR techniques in cervical, rib, thoracic, lumbar, and sacral  areas  Patient tolerated the procedure well with improvement in symptoms  Patient given exercises, stretches and lifestyle modifications  See medications in patient instructions if  given  Patient will follow up in 4-8 weeks      The above documentation has been reviewed and is accurate and complete Lyndal Pulley, DO        Note: This dictation was prepared with Dragon dictation along with smaller phrase technology. Any transcriptional errors that result from this process are unintentional.

## 2021-03-18 NOTE — Patient Instructions (Signed)
If you are age 57 or older, your body mass index should be between 23-30. Your Body mass index is 26.69 kg/m. If this is out of the aforementioned range listed, please consider follow up with your Primary Care Provider.  If you are age 51 or younger, your body mass index should be between 19-25. Your Body mass index is 26.69 kg/m. If this is out of the aformentioned range listed, please consider follow up with your Primary Care Provider.   __________________________________________________________  The [facility] providers would like to encourage you to use [facility] to communicate with providers for non-urgent requests or questions.  Due to long hold times on the telephone, sending your provider a message by [facility] may be a faster and more efficient way to get a response.  Please allow 48 business hours for a response.  Please remember that this is for non-urgent requests.   Please call to schedule a follow up in Jan 2023  It was a pleasure to see you today!  Thank you for trusting me with your gastrointestinal care!

## 2021-03-18 NOTE — Telephone Encounter (Signed)
I think that was a mistake on my part when she spoke the name several times, and I though she said "omeprazole"    I am hoping she is ok with trying this though, and let me know in a week if not working well (she might be surprised); I could always change back if needed

## 2021-03-18 NOTE — Telephone Encounter (Signed)
   Patient wants to know why Esomeprazole was changed.

## 2021-03-19 ENCOUNTER — Encounter: Payer: Self-pay | Admitting: Family Medicine

## 2021-03-19 ENCOUNTER — Ambulatory Visit (INDEPENDENT_AMBULATORY_CARE_PROVIDER_SITE_OTHER): Payer: 59 | Admitting: Family Medicine

## 2021-03-19 ENCOUNTER — Other Ambulatory Visit (HOSPITAL_COMMUNITY): Payer: Self-pay

## 2021-03-19 ENCOUNTER — Other Ambulatory Visit: Payer: Self-pay

## 2021-03-19 VITALS — BP 102/72 | HR 101 | Ht 61.0 in | Wt 141.0 lb

## 2021-03-19 DIAGNOSIS — M9908 Segmental and somatic dysfunction of rib cage: Secondary | ICD-10-CM

## 2021-03-19 DIAGNOSIS — M9904 Segmental and somatic dysfunction of sacral region: Secondary | ICD-10-CM | POA: Diagnosis not present

## 2021-03-19 DIAGNOSIS — M9902 Segmental and somatic dysfunction of thoracic region: Secondary | ICD-10-CM

## 2021-03-19 DIAGNOSIS — M9903 Segmental and somatic dysfunction of lumbar region: Secondary | ICD-10-CM

## 2021-03-19 DIAGNOSIS — M222X2 Patellofemoral disorders, left knee: Secondary | ICD-10-CM

## 2021-03-19 DIAGNOSIS — M222X1 Patellofemoral disorders, right knee: Secondary | ICD-10-CM

## 2021-03-19 DIAGNOSIS — G4486 Cervicogenic headache: Secondary | ICD-10-CM

## 2021-03-19 DIAGNOSIS — M9901 Segmental and somatic dysfunction of cervical region: Secondary | ICD-10-CM | POA: Diagnosis not present

## 2021-03-19 MED ORDER — METHYLPREDNISOLONE ACETATE 40 MG/ML IJ SUSP
40.0000 mg | Freq: Once | INTRAMUSCULAR | Status: AC
  Administered 2021-03-19: 40 mg via INTRAMUSCULAR

## 2021-03-19 MED ORDER — KETOROLAC TROMETHAMINE 30 MG/ML IJ SOLN
30.0000 mg | Freq: Once | INTRAMUSCULAR | Status: AC
  Administered 2021-03-19: 30 mg via INTRAMUSCULAR

## 2021-03-19 NOTE — Telephone Encounter (Signed)
Patient notified

## 2021-03-19 NOTE — Assessment & Plan Note (Signed)
Chronic problem with exacerbation.  I do believe that this is more secondary to significant underlying inflammation.  The patient is taking the duloxetine.  He still having difficulty.  Chronic problem with exacerbation.  Has had significant work-up that did not show anything else.  Seems to be more polyarthralgia with chronic pain.  Follow-up with me again in 6 to 8 weeks

## 2021-03-19 NOTE — Patient Instructions (Signed)
See you in 6 weeks and can repeat injections if needed

## 2021-03-19 NOTE — Assessment & Plan Note (Signed)
Has done relatively well with the injection, with light potential viscosupplementation.  Still has not heard back from primary insurance that if this will be covered or not.  Discussed which activities to do which wants to avoid.  Increase activity slowly.  Follow-up again in 6 to 8 weeks

## 2021-03-20 ENCOUNTER — Telehealth: Payer: Self-pay

## 2021-03-20 ENCOUNTER — Other Ambulatory Visit (HOSPITAL_COMMUNITY): Payer: Self-pay

## 2021-03-20 NOTE — Telephone Encounter (Signed)
F/u   Received fax from [employer] - approval   Medication Ajovy  225 mg /1.5 ml    The authorization is effective from 03/20/2021 to 03/19/2022.

## 2021-03-20 NOTE — Telephone Encounter (Signed)
New message   Malena Catholic garcia Key: [id number] - PA Case ID: [id number] help? Call us at [phone] Status Sent to [place] (fremanezumab-vfrm) injection 225MG /1.5ML auto-injectors Form [employer] ePA Form 2017 NCPDP

## 2021-03-25 ENCOUNTER — Other Ambulatory Visit (HOSPITAL_COMMUNITY): Payer: Self-pay

## 2021-03-25 ENCOUNTER — Telehealth: Payer: Self-pay | Admitting: Neurology

## 2021-03-25 NOTE — Telephone Encounter (Signed)
Pt needs a call back regarding the ajovy. She said it was approved but it will cost her 150.00 mth. She needs something cheaper.

## 2021-03-26 NOTE — Telephone Encounter (Signed)
Telephone call to the pt, Advised pt to call her insurance to see what medications are cheaper in the same class or if there is a exception we can do.  If that do not work fill out the Assistance forms for [employer] and we can fax it to see if that could help since her pharmacy do accept copay cards.  Per the rep at [facility] the cost is with the copay card it took off $300.

## 2021-03-30 ENCOUNTER — Encounter: Payer: Self-pay | Admitting: Internal Medicine

## 2021-03-30 ENCOUNTER — Ambulatory Visit (INDEPENDENT_AMBULATORY_CARE_PROVIDER_SITE_OTHER): Payer: 59 | Admitting: Internal Medicine

## 2021-03-30 ENCOUNTER — Other Ambulatory Visit: Payer: Self-pay

## 2021-03-30 VITALS — BP 116/82 | HR 102 | Ht 61.0 in | Wt 142.8 lb

## 2021-03-30 DIAGNOSIS — E663 Overweight: Secondary | ICD-10-CM | POA: Diagnosis not present

## 2021-03-30 DIAGNOSIS — E782 Mixed hyperlipidemia: Secondary | ICD-10-CM

## 2021-03-30 DIAGNOSIS — Z794 Long term (current) use of insulin: Secondary | ICD-10-CM | POA: Diagnosis not present

## 2021-03-30 DIAGNOSIS — E1165 Type 2 diabetes mellitus with hyperglycemia: Secondary | ICD-10-CM

## 2021-03-30 LAB — POCT GLYCOSYLATED HEMOGLOBIN (HGB A1C): Hemoglobin A1C: 6.7 % — AB (ref 4.0–5.6)

## 2021-03-30 NOTE — Progress Notes (Signed)
Subjective:     Patient ID: Lysbeth Dicola, female   DOB: 08-02-1965, 55 y.o.   MRN: [id number]  This visit occurred during the SARS-CoV-2 public health emergency.  Safety protocols were in place, including screening questions prior to the visit, additional usage of staff PPE, and extensive cleaning of exam room while observing appropriate contact time as indicated for disinfecting solutions.   HPI [name] is a  55 y.o. woman, returning for f/u for DM2, dx 1987, uncontrolled, insulin-dependent, with probable complications (? peripheral neuropathy, ?  Gastroparesis).  Last visit 6 months ago.  Interim history: No increased urination, blurry vision, chest pain. At last visit, she was tolerating Ozempic well, but then developed nausea, vomiting, bloating and a gastric emptying study (02/03/2021) showed gastroparesis (but checked on Ozempic!). Started on Reglan. She is not using this consistently. Now nausea improved. She has constipation - on Linzess.  Constipation also improved. She missed her last appointment as she was traveling (to [place]  Reviewed HbA1c levels: Lab Results  Component Value Date   HGBA1C 8.3 (H) 12/30/2020   HGBA1C 6.6 (A) 09/16/2020   HGBA1C 7.8 (A) 06/17/2020  02/17/2017: HbA1c calculated from fructosamine is better, at 8.34%, but still high. Prev. 10.1%.  Reviewed history: She came off all DM medicines for 6 mo before the HbA1c in 09/2013. She again ran out of all meds in 04/2016.  She is  on: - Lantus 40 >> 34 >> 36 >> 32 >> 28 units in a.m. -  >> stopped 12/2020 - Ozempic 0.5 mg weekly - added 06/2019 >> 0.25 >> 0.5 mg weekly  Stopped Januvia 100 mg in am >> then Ozempic 0.5 mg weekly She is off Metformin XR 1000 mg 2x a day with b'fast and dinner >> upset stomach >> stopped 15/4008 We triedTrulicity 1.5 mg weekly >> worked great but had to stop b/c GERD >> stopped. We tried Jardiance 25 mg daily >> started 08/2016 >> but stopped recently 2/2  recurrent UTIs She was on Glipizide 5 mg bid - added 04/2015 >> was not taking it b/c low CBGs. We stopped Glipizide XL 5 mg in 07/2014. She had episodes of hypoglycemia with Amaryl in the past. She was on Bydureon 2 mg weekly (had nausea).  She checks her sugars 1-2 times a day: - am: 180-300 >> ... 66, 74, 84-141, 145 >> 88-126, 137, 200 - after b'fast: 158-251 >> n/c - before lunch: 180-200 >> n/c >> 60 x1 >> n/c - after lunch: up to 200s >> n/c - before dinner:  180-200 >> 137, 150 >> 140-150  >> n/c - after dinner: 166-245 >> n/c - bedtime: n/c >> 300 >> 100-125 (has snack) >> n/c She has hypoglycemia awareness in the 90s. Lowest: 38 (while on Trulicity) >> ... 49 (05/2020) at night - lost consciousness x2 >> 88. Highest 600 >> .Marland Kitchen.  150 >> HI >> 210 ([place] food).  She saw nutrition in the past.  -+ HL. Last lipids: Lab Results  Component Value Date   CHOL 229 (H) 08/28/2020   HDL 57.10 08/28/2020   LDLCALC 151 (H) 08/28/2020   LDLDIRECT 191.0 08/23/2018   TRIG 103.0 08/28/2020   CHOLHDL 4 08/28/2020  On Crestor 40 (ran out Crestor) >> restarted.  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  Review of Systems + see HPI  I reviewed pt's medications, allergies, PMH, social hx, family hx, and changes were documented in the history of present illness. Otherwise, unchanged from my initial visit note.  Past Medical History:  Diagnosis Date   ALLERGIC RHINITIS 10/04/2007   Anemia 01/21/2011   Anxiety 11/15/2018   ASTHMA 08/02/2007   INHALERS ONLY IN [place]   Asthma 08/02/2007   Qualifier: Diagnosis of  By: Wynona Luna    Blood transfusion without reported diagnosis    with first child    Cervical radiculopathy 01/21/2011   Cervicogenic  headache 04/27/2017   COMMON MIGRAINE 05/15/2009   Depression 01/09/2010   Qualifier: Diagnosis of  By: Jenny Reichmann MD, Xenia, TYPE II 08/02/2007   Dysphagia, pharyngoesophageal phase 06/12/2014   GERD 08/02/2007   HYPERLIPIDEMIA 08/02/2007   Hyperlipidemia 08/02/2007   Qualifier: Diagnosis of  By: Wynona Luna    INSOMNIA-SLEEP DISORDER-UNSPEC 01/04/2008   INTERMITTENT VERTIGO 05/15/2009   LIBIDO, DECREASED 01/09/2010   Migraine without aura 05/15/2009   Qualifier: Diagnosis of  By: Jenny Reichmann MD, Hunt Oris    Nonallopathic lesion of rib cage 12/21/2017   Nonallopathic lesion of sacral region 07/29/2015   Nonallopathic lesion of thoracic region 07/29/2015   Osteoporosis 04/25/2019   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   Rheumatoid factor positive 02/10/2016   Right knee pain 01/31/2019   Injected January 31, 2019   Type 2 diabetes mellitus with hyperglycemia, with long-term current use of insulin ([employer]) 08/02/2007   Qualifier: Diagnosis of  By: Wynona Luna    Past Surgical History:  Procedure Laterality Date   CESAREAN SECTION     x 3   CYST EXCISION     left knee   PAROTIDECTOMY  10/21/2011   Procedure: PAROTIDECTOMY;  Surgeon: Melida Quitter, MD;  Location: [facility] OR;  Service: ENT;  Laterality: Left;   Social History   Socioeconomic History   Marital status: Married    Spouse name: [name]   Number of children: 3   Years of education: Degree   Highest education level: Not on file  Occupational History   Occupation: Chartered certified accountant    Employer: [employer]   Occupation: [place]    Employer: [employer]  Tobacco Use   Smoking status: Never   Smokeless tobacco: Never  Vaping Use   Vaping Use: Never used  Substance and Sexual Activity   Alcohol use: No    Alcohol/week: 0.0 standard drinks   Drug use: No   Sexual activity: Not on file  Other Topics Concern   Not on file  Social History Narrative   She has 3 daughters.   Patient has a 4 year  degree.    Patient working at [employer].    Patient is married to [place].   Social Determinants of Health   Financial Resource Strain: Not on file  Food Insecurity: Not on file  Transportation Needs: Not on file  Physical Activity: Not on file  Stress: Not on file  Social Connections: Not on file  Intimate Partner Violence: Not on file   Current Outpatient Medications on File Prior to Visit  Medication Sig Dispense Refill   albuterol (VENTOLIN HFA) 108 (90 Base) MCG/ACT inhaler INHALE 2 PUFFS INTO THE LUNGS EVERY 6 (SIX) HOURS AS NEEDED FOR WHEEZING  OR SHORTNESS OF BREATH. 18 g 5   aspirin 81 MG EC tablet Take 81 mg by mouth daily.     butalbital-acetaminophen-caffeine (FIORICET) 50-325-40 MG tablet Take 1 tablet by mouth every 6 (six) hours as needed for headache. Max 2 tablets per day 30 tablet 3   Cholecalciferol (VITAMIN D) 50 MCG (2000 UT) CAPS Take 4,000 Units by mouth daily.     cyclobenzaprine (FLEXERIL) 5 MG tablet TAKE 1 TABLET BY MOUTH THREE TIMES DAILY AS NEEDED FOR MUSCLE SPASMS. 90 tablet 1   Diclofenac Sodium (PENNSAID) 2 % SOLN Place 2 g onto the skin 2 (two) times daily. 112 g 3   DULoxetine (CYMBALTA) 60 MG capsule Take 60 mg by mouth daily.     Fremanezumab-vfrm (AJOVY) 225 MG/1.5ML SOAJ Inject 225 mg into the skin every 28 (twenty-eight) days. 1.5 mL 5   gabapentin (NEURONTIN) 300 MG capsule TAKE 2 CAPSULES BY MOUTH 2 TIMES DAILY 120 capsule 5   glucose blood (ACCU-CHEK GUIDE) test strip Use as instructed 2x a day 200 each 12   insulin aspart (NOVOLOG) 100 UNIT/ML FlexPen INJECT 5 TO 8 UNITS INTO THE SKIN THREE TIMES DAILY BEFORE MEALS. 15 mL 5   insulin glargine (LANTUS) 100 UNIT/ML Solostar Pen Inject 28 Units into the skin daily. 30 mL 1   Insulin Pen Needle 32G X 4 MM MISC USE AS DIRECTED. 200 each 3   Lancets (FREESTYLE) lancets USE 1 FOUR TIMES DAILY AS DIRECTED. (Patient taking differently: USE 1 FOUR TIMES DAILY AS DIRECTED.) 400 each 12   Lasmiditan  Succinate (REYVOW) 100 MG TABS Take 100 mg by mouth as needed. Take 1 tablet for headache. No more than 1 tablet in 24 hours 8 tablet 3   linaclotide (LINZESS) 145 MCG CAPS capsule Take 1 capsule (145 mcg total) by mouth daily before breakfast 30 capsule 2   metoCLOPramide (REGLAN) 5 MG tablet Take 1 tablet (5 mg total) by mouth 3 (three) times daily 30 minutes before meals 90 tablet 1   Multiple Vitamin (MULTIVITAMIN WITH MINERALS) TABS tablet Take 1 tablet by mouth daily.     omeprazole (PRILOSEC) 40 MG capsule Take 1 capsule (40 mg total) by mouth daily. 180 capsule 1   ondansetron (ZOFRAN ODT) 8 MG disintegrating tablet Take 1 tablet (8 mg total) by mouth every 8 (eight) hours as needed for nausea or vomiting. 30 tablet 0   Semaglutide,0.25 or 0.5MG /DOS, 2 MG/1.5ML SOPN INJECT 0.5 MG INTO THE SKIN ONCE A WEEK. 4.5 mL 3   triamcinolone (NASACORT) 55 MCG/ACT AERO nasal inhaler Place 2 sprays into the nose daily. 1 Inhaler 12   No current facility-administered medications on file prior to visit.   Allergies  Allergen Reactions   Lipitor [Atorvastatin]     REACTION: myalgias   Nitroglycerin    Family History  Problem Relation Age of Onset   Hypertension Father    Diabetes Father    Asthma Father    Cervical cancer Maternal Aunt    Heart disease Maternal Aunt    Anesthesia problems Neg Hx    Colon cancer Neg Hx    Breast cancer Neg Hx    Esophageal cancer Neg Hx    Stomach cancer Neg Hx     Objective:   Physical Exam BP 116/82 (BP Location: Left Arm, Patient Position: Sitting, Cuff Size: Normal)   Pulse (!) 102   Ht 5\' 1"  (1.549 m)   Wt 142 lb 12.8 oz (64.8 kg)   SpO2  99%   BMI 26.98 kg/m  Body mass index is 26.98 kg/m. Wt Readings from Last 3 Encounters:  03/30/21 142 lb 12.8 oz (64.8 kg)  03/19/21 141 lb (64 kg)  03/18/21 141 lb 4 oz (64.1 kg)   Constitutional: + slightly overweight, in NAD Eyes: PERRLA, EOMI, no exophthalmos ENT: moist mucous membranes, no  thyromegaly, no cervical lymphadenopathy Cardiovascular: tachycardia, RR, No MRG Respiratory: CTA B Gastrointestinal: abdomen soft, NT, ND, BS+ Musculoskeletal: no deformities, strength intact in all 4 Skin: moist, warm, no rashes Neurological: no tremor with outstretched hands, DTR normal in all 4  Assessment:     1. DM2, uncontrolled, insulin-dependent, with probable complications - ? peripheral neuropathy  2. HL  3.  Overweight  Plan:     1. Patient with history of uncontrolled type 2 diabetes, basal insulin and GLP-1 receptor agonist, with improved control lately.  She was previously noncompliant with visits and medications.  However, at last visit, sugars were better while on Ozempic and she was taking the rapid acting insulin inconsistently and only when sugars are higher than 120.  Therefore, we stop NovoLog completely.  At that time, she was Gerkin sugars only in the morning and they were almost all at goal.  She did have 1 blood sugar in the 70s and 1 in the 60s so we decreased the dose of Lantus.  I did advise her to check sugars at different times of the day and to let me know if they increase, in which case, we planned to increase Ozempic.  HbA1c at last visit was better, at 6.6%. -Unfortunately, since last visit, she had nausea/vomiting/bloating.  She had a gastric emptying study 02/03/2021 which showed gastroparesis.  At that time, however, she was on Ozempic and I explained that this does slow down gastric emptying.  Also, this can cause constipation.  She is now treated for constipation with Linzess and symptoms are better.  Also, her nausea is better despite the fact that she is not taking Reglan consistently.  We discussed about possibly stopping Ozempic but she would like to continue since it has such a good control on her diabetes and her GI symptoms are improving. -However, she had a more recent HbA1c obtained 3 months ago and this was higher, at 8.3% -At today's visit,  however, HbA1c has improved: 6.7% -Reviewing her meter download, sugars are at goal in the morning with few exceptions, one of them being in the 200s after eating [place] food the night before.  She is not checking sugars later in the day and I again advised her to start. -Otherwise, I do not feel we need to change her regimen. - I advised her to: Patient Instructions  Please continue: - Lantus 28 units in a.m. - Ozempic 0.5 mg weekly in a.m.  Please return in 3-4 months with your sugar log.   - advised to check sugars at different times of the day - 1-2x a day, rotating check times - advised for yearly eye exams >> she is UTD - return to clinic in 3-4 months  2. HL -Reviewed latest lipid panel from 08/2020: LDL high, but improved from 2020, the rest of the fractions at goal: Lab Results  Component Value Date   CHOL 229 (H) 08/28/2020   HDL 57.10 08/28/2020   LDLCALC 151 (H) 08/28/2020   LDLDIRECT 191.0 08/23/2018   TRIG 103.0 08/28/2020   CHOLHDL 4 08/28/2020  -On Crestor 40 mg daily-was missing doses in the past.  No  side effects  3.  Overweight -We will continue Ozempic which should also help with weight loss -Weight is stable at this visit  Philemon Kingdom, MD PhD [facility] Endocrinology

## 2021-03-30 NOTE — Patient Instructions (Addendum)
Please continue: - Lantus 28 units in a.m. - Ozempic 0.5 mg weekly in a.m.  Please return in 3-4 months with your sugar log.

## 2021-04-03 ENCOUNTER — Telehealth: Payer: Self-pay | Admitting: Neurology

## 2021-04-03 NOTE — Telephone Encounter (Signed)
Pt stated she has been fighting with her ins regarding migraine meds. Her ins will approve botox every 3 months.

## 2021-04-07 NOTE — Telephone Encounter (Signed)
Pt wants to restart Botox. Pt can not keep going on with these migraines.  Pt can not afford $200 a month for the Ajovy. Even with the copay card.

## 2021-04-08 ENCOUNTER — Telehealth: Payer: Self-pay

## 2021-04-08 NOTE — Telephone Encounter (Signed)
F/u   [facility] GARCIA Key: [id number] - PA Case ID: [id number] help? Call us at [phone] Status Sent to [facility] Botox 200UNIT solution Form MedImpact ePA Form 2017 NCPDP

## 2021-04-08 NOTE — Telephone Encounter (Signed)
New message   Your information has been sent to [place].  Dwayne MUNOZ GARCIA Key: Dessie Coma - PA Case ID: [id number] help? Call us at [phone] Status Sent to [facility] Botox 200UNIT solution Form MedImpact ePA Form 2017 NCPDP

## 2021-04-13 NOTE — Telephone Encounter (Signed)
Pt called in today regarding her PA fpr botox. Stated her ins told her it was approved, I let her know what I saw and to check back. Let her know Jari Sportsman will be back 11/1

## 2021-04-17 NOTE — Telephone Encounter (Signed)
Patient called requesting to schedule a Botox injection. She said she received a letter authorizing it yesterday.   Routing to [place] to confirm prior to scheduling.

## 2021-04-17 NOTE — Telephone Encounter (Signed)
F/u   Received fax from [facility] notice of denial of prescription drug coverage Botox 200 unit vial.   Your doctor requested a 1-200 unit vial every 30 days.  Your plan allows for 1-200 unit vials every 3 months which has been approved.   Call the patient at [phone] and made her aware of the fax the patient voiced she received the same letter in the mail.   The patient verbalized understanding a message will be sent to MD for review and a call back from the office next week.

## 2021-04-21 ENCOUNTER — Other Ambulatory Visit: Payer: Self-pay | Admitting: Internal Medicine

## 2021-04-21 ENCOUNTER — Other Ambulatory Visit (HOSPITAL_COMMUNITY): Payer: Self-pay

## 2021-04-21 MED ORDER — BUTALBITAL-APAP-CAFFEINE 50-325-40 MG PO TABS
1.0000 | ORAL_TABLET | Freq: Four times a day (QID) | ORAL | 3 refills | Status: DC | PRN
  Filled 2021-04-21: qty 30, 8d supply, fill #0
  Filled 2021-07-27: qty 30, 8d supply, fill #1
  Filled 2021-10-12: qty 30, 15d supply, fill #2

## 2021-04-21 MED FILL — Semaglutide Soln Pen-inj 0.25 or 0.5 MG/DOSE (2 MG/1.5ML): SUBCUTANEOUS | 84 days supply | Qty: 4.5 | Fill #1 | Status: AC

## 2021-04-23 ENCOUNTER — Other Ambulatory Visit (HOSPITAL_COMMUNITY): Payer: Self-pay

## 2021-04-23 ENCOUNTER — Telehealth: Payer: Self-pay | Admitting: Internal Medicine

## 2021-04-23 ENCOUNTER — Ambulatory Visit (INDEPENDENT_AMBULATORY_CARE_PROVIDER_SITE_OTHER): Payer: 59 | Admitting: Sports Medicine

## 2021-04-23 ENCOUNTER — Other Ambulatory Visit: Payer: Self-pay

## 2021-04-23 VITALS — BP 110/70 | HR 107 | Ht 61.0 in | Wt 140.0 lb

## 2021-04-23 DIAGNOSIS — M9903 Segmental and somatic dysfunction of lumbar region: Secondary | ICD-10-CM

## 2021-04-23 DIAGNOSIS — M9908 Segmental and somatic dysfunction of rib cage: Secondary | ICD-10-CM

## 2021-04-23 DIAGNOSIS — G4486 Cervicogenic headache: Secondary | ICD-10-CM | POA: Diagnosis not present

## 2021-04-23 DIAGNOSIS — M9901 Segmental and somatic dysfunction of cervical region: Secondary | ICD-10-CM | POA: Diagnosis not present

## 2021-04-23 DIAGNOSIS — M9905 Segmental and somatic dysfunction of pelvic region: Secondary | ICD-10-CM

## 2021-04-23 DIAGNOSIS — M9902 Segmental and somatic dysfunction of thoracic region: Secondary | ICD-10-CM | POA: Diagnosis not present

## 2021-04-23 MED ORDER — DULOXETINE HCL 60 MG PO CPEP
60.0000 mg | ORAL_CAPSULE | Freq: Every day | ORAL | 3 refills | Status: DC
  Filled 2021-04-23: qty 90, 90d supply, fill #0
  Filled 2022-03-23: qty 90, 90d supply, fill #1

## 2021-04-23 MED FILL — Cyclobenzaprine HCl Tab 5 MG: ORAL | 30 days supply | Qty: 90 | Fill #0 | Status: AC

## 2021-04-23 NOTE — Telephone Encounter (Signed)
1.Medication Requested: DULoxetine (CYMBALTA) 60 MG capsule  2. Pharmacy ([facility]):  [place] Phone:  [phone]  Fax:  [phone]      3. On Med List: y  55. Last Visit with PCP:  10.3.22  5. Next visit date with PCP:   4.3.23  **Patient states she had previously gotten this filled in the [place] because it was cheaper   Agent: Please be advised that RX refills may take up to 3 business days. We ask that you follow-up with your pharmacy.

## 2021-04-23 NOTE — Telephone Encounter (Signed)
Per pt she had a migraines for two weeks now. It lasting two days non stop,No Rescue medication  Yes tried ADVil and tylenol. Pt states she taking so much  NSAIDS, she can not count.  Please advise.   Advised of PA status.

## 2021-04-23 NOTE — Patient Instructions (Addendum)
Good to see you   Follow up in 4 weeks

## 2021-04-23 NOTE — Progress Notes (Signed)
Rachel [name] D.[place] Phone: [phone]   Assessment and Plan:     1. Cervicogenic headache 2. Somatic dysfunction of cervical region 3. Somatic dysfunction of thoracic region 4. Somatic dysfunction of lumbar region 5. Somatic dysfunction of pelvic region 6. Somatic dysfunction of rib region -Chronic with exacerbation, subsequent visit - Recurrence of chronic musculoskeletal complaints with most prominent being lower cervical and upper thoracic leading to cervicogenic headaches - Patient has received significant relief with OMT in the past.  Elects for repeat OMT today.  Tolerated well per note below. - Decision today to treat with OMT was based on Physical Exam   After verbal consent patient was treated with HVLA (high velocity low amplitude), ME (muscle energy), FPR (flex positional release), ST (soft tissue), PC/PD (Pelvic Compression/ Pelvic Decompression) techniques in cervical, rib, thoracic, lumbar, and pelvic areas. Patient tolerated the procedure well with improvement in symptoms.  Patient educated on potential side effects of soreness and recommended to rest, hydrate, and use Tylenol as needed for pain control.   Pertinent previous records reviewed include none   Follow Up: 4 weeks for repeat OMT   Subjective:    Chief Complaint: Neck and back pain   HPI:   04/23/21 Patient is a 55 year old female presenting with neck and back pain. Patient was last seen by Dr. [name] on 03/19/21 for this reason and had OMT. Today patient reports that mid back to upper neck pain that is burning. Ready for some manipulation.   Relevant Historical Information: DM type II,, orthostatic hypotension  Additional pertinent review of systems negative.  Current Outpatient Medications  Medication Sig Dispense Refill   aspirin 81 MG EC tablet Take 81 mg by mouth daily.     butalbital-acetaminophen-caffeine (FIORICET) 50-325-40  MG tablet Take 1 tablet by mouth every 6 (six) hours as needed for headache. Max 2 tablets per day 30 tablet 3   Cholecalciferol (VITAMIN D) 50 MCG (2000 UT) CAPS Take 4,000 Units by mouth daily.     cyclobenzaprine (FLEXERIL) 5 MG tablet Take 1 tablet (5 mg total) by mouth 3 (three) times daily as needed. for muscle spams 90 tablet 1   Diclofenac Sodium (PENNSAID) 2 % SOLN Place 2 g onto the skin 2 (two) times daily. 112 g 3   Fremanezumab-vfrm (AJOVY) 225 MG/1.5ML SOAJ Inject 225 mg into the skin every 28 (twenty-eight) days. 1.5 mL 5   gabapentin (NEURONTIN) 300 MG capsule TAKE 2 CAPSULES BY MOUTH 2 TIMES DAILY 120 capsule 5   glucose blood (ACCU-CHEK GUIDE) test strip Use as instructed 2x a day 200 each 12   insulin glargine (LANTUS) 100 UNIT/ML Solostar Pen Inject 28 Units into the skin daily. 30 mL 1   Insulin Pen Needle 32G X 4 MM MISC USE AS DIRECTED. 200 each 3   Lasmiditan Succinate (REYVOW) 100 MG TABS Take 100 mg by mouth as needed. Take 1 tablet for headache. No more than 1 tablet in 24 hours 8 tablet 3   linaclotide (LINZESS) 145 MCG CAPS capsule Take 1 capsule (145 mcg total) by mouth daily before breakfast 30 capsule 2   metoCLOPramide (REGLAN) 5 MG tablet Take 1 tablet (5 mg total) by mouth 3 (three) times daily 30 minutes before meals 90 tablet 1   Multiple Vitamin (MULTIVITAMIN WITH MINERALS) TABS tablet Take 1 tablet by mouth daily.     omeprazole (PRILOSEC) 40 MG capsule Take 1 capsule (40  mg total) by mouth daily. 180 capsule 1   ondansetron (ZOFRAN ODT) 8 MG disintegrating tablet Take 1 tablet (8 mg total) by mouth every 8 (eight) hours as needed for nausea or vomiting. 30 tablet 0   Semaglutide,0.25 or 0.5MG /DOS, 2 MG/1.5ML SOPN INJECT 0.5 MG INTO THE SKIN ONCE A WEEK. 4.5 mL 3   triamcinolone (NASACORT) 55 MCG/ACT AERO nasal inhaler Place 2 sprays into the nose daily. 1 Inhaler 12   albuterol (VENTOLIN HFA) 108 (90 Base) MCG/ACT inhaler INHALE 2 PUFFS INTO THE LUNGS EVERY 6  (SIX) HOURS AS NEEDED FOR WHEEZING OR SHORTNESS OF BREATH. 18 g 5   DULoxetine (CYMBALTA) 60 MG capsule Take 1 capsule (60 mg total) by mouth daily. 90 capsule 3   No current facility-administered medications for this visit.      Objective:     Vitals:   04/23/21 1258  BP: 110/70  Pulse: (!) 107  SpO2: 98%  Weight: 140 lb (63.5 kg)  Height: 5\' 1"  (1.549 m)      Body mass index is 26.45 kg/m.    Physical Exam:     General: Well-appearing, cooperative, sitting comfortably in no acute distress.   OMT Physical Exam:  ASIS Compression Test: Positive Right Cervical: TTP paraspinal, C5-7 RRSR Rib: Bilateral elevated first rib with TTP Thoracic: TTP paraspinal, T1-3 RLSR Lumbar: TTP paraspinal, L1-3 RRSL Pelvis: Right anterior innominate with out flare  Electronically signed by:  Rachel [name] D.[facility] 1:47 PM 04/23/21

## 2021-04-23 NOTE — Telephone Encounter (Signed)
Patient called requesting an update on this matter.

## 2021-04-24 NOTE — Telephone Encounter (Signed)
Pt advise of dr.Jaffe note,  There is nothing much that I can do to quickly reduce frequency of her migraines, if that is what she is asking.  We need to start a preventative.  This is the fault of her insurance for denying her the medications that do work and prolonging our ability to start another preventative.  She needs to try and limit use of pain relievers to no more than 2 days out of the week to prevent rebound headache.  Unfortunately, it is just a waiting game at this point

## 2021-05-12 ENCOUNTER — Telehealth: Payer: Self-pay | Admitting: Neurology

## 2021-05-12 MED ORDER — BOTOX 200 UNITS IJ SOLR: INTRAMUSCULAR | 4 refills | Status: DC

## 2021-05-12 NOTE — Telephone Encounter (Signed)
Pt called in wanting to follow up on where we are at getting her ready to schedule to have botox injections

## 2021-05-12 NOTE — Telephone Encounter (Signed)
F/u  Received fax from [place]  Medication Botox 200 unit   Quantity limit of 1 vial per 90 days and is valid until 10.30.2023.

## 2021-05-12 NOTE — Telephone Encounter (Signed)
F/u   Your information has been sent to [place].  Rachel [name] Key: [id number] - PA Case ID: [id number] help? Call us at [phone] Status Sent to [facility] Botox 200UNIT solution Form MedImpact ePA Form 2017 NCPDP

## 2021-05-12 NOTE — Telephone Encounter (Signed)
F/u  Rachel [name] Key: [id number] - PA Case ID: [id number] help? Call us at [phone] Status Sent to [facility] Botox 200UNIT solution Form MedImpact ePA Form 2017 NCPDP

## 2021-05-12 NOTE — Addendum Note (Signed)
Addended by: Venetia Night on: 05/12/2021 02:37 PM   Modules accepted: Orders

## 2021-05-21 NOTE — Progress Notes (Signed)
Rachel [name] D.[place] Phone: [phone]   Assessment and Plan:     1. Cervicogenic headache 2. Somatic dysfunction of cervical region 3. Somatic dysfunction of thoracic region 4. Somatic dysfunction of lumbar region 5. Somatic dysfunction of pelvic region 6. Somatic dysfunction of rib region -Chronic with exacerbation, subsequent visit - Recurrence of multiple chronic musculoskeletal complaints with most prominent being in cervical spine - No red flag symptoms, so no imaging taken today - Patient has received significant relief with OMT in the past.  Elects for repeat OMT today.  Tolerated well per note below. - Decision today to treat with OMT was based on Physical Exam   After verbal consent patient was treated with HVLA (high velocity low amplitude), ME (muscle energy), FPR (flex positional release), ST (soft tissue), PC/PD (Pelvic Compression/ Pelvic Decompression) techniques in cervical, rib, thoracic, lumbar, and pelvic areas. Patient tolerated the procedure well with improvement in symptoms.  Patient educated on potential side effects of soreness and recommended to rest, hydrate, and use Tylenol as needed for pain control.   Pertinent previous records reviewed include none   Follow Up: 4 weeks for repeat OMT   Subjective:   I, Rachel [name], am serving as a Education administrator for Doctor Glennon Mac  Chief Complaint: back pain   HPI:   04/23/21 Patient is a 55 year old female presenting with neck and back pain. Patient was last seen by Dr. [name] on 03/19/21 for this reason and had OMT. Today patient reports that mid back to upper neck pain that is burning. Ready for some manipulation.   05/22/21 Patient states that neck and back feel the same says it feels good for a few days and then goes back to the same. Going to the DR next week    Relevant Historical Information: DM type II,, orthostatic  hypotension   Additional pertinent review of systems negative.  Current Outpatient Medications  Medication Sig Dispense Refill   albuterol (VENTOLIN HFA) 108 (90 Base) MCG/ACT inhaler INHALE 2 PUFFS INTO THE LUNGS EVERY 6 (SIX) HOURS AS NEEDED FOR WHEEZING OR SHORTNESS OF BREATH. 18 g 5   aspirin 81 MG EC tablet Take 81 mg by mouth daily.     Botulinum Toxin Type A (BOTOX) 200 units SOLR Inject 155 units IM Into multiple sites in the face,neck,and head every 90 days 1 each 4   butalbital-acetaminophen-caffeine (FIORICET) 50-325-40 MG tablet Take 1 tablet by mouth every 6 (six) hours as needed for headache. Max 2 tablets per day 30 tablet 3   Cholecalciferol (VITAMIN D) 50 MCG (2000 UT) CAPS Take 4,000 Units by mouth daily.     cyclobenzaprine (FLEXERIL) 5 MG tablet Take 1 tablet (5 mg total) by mouth 3 (three) times daily as needed. for muscle spams 90 tablet 1   Diclofenac Sodium (PENNSAID) 2 % SOLN Place 2 g onto the skin 2 (two) times daily. 112 g 3   DULoxetine (CYMBALTA) 60 MG capsule Take 1 capsule (60 mg total) by mouth daily. 90 capsule 3   Fremanezumab-vfrm (AJOVY) 225 MG/1.5ML SOAJ Inject 225 mg into the skin every 28 (twenty-eight) days. 1.5 mL 5   gabapentin (NEURONTIN) 300 MG capsule TAKE 2 CAPSULES BY MOUTH 2 TIMES DAILY 120 capsule 5   glucose blood (ACCU-CHEK GUIDE) test strip Use as instructed 2x a day 200 each 12   insulin glargine (LANTUS) 100 UNIT/ML Solostar Pen Inject 28 Units into the skin  daily. 30 mL 1   Insulin Pen Needle 32G X 4 MM MISC USE AS DIRECTED. 200 each 3   Lasmiditan Succinate (REYVOW) 100 MG TABS Take 100 mg by mouth as needed. Take 1 tablet for headache. No more than 1 tablet in 24 hours 8 tablet 3   linaclotide (LINZESS) 145 MCG CAPS capsule Take 1 capsule (145 mcg total) by mouth daily before breakfast 30 capsule 2   metoCLOPramide (REGLAN) 5 MG tablet Take 1 tablet (5 mg total) by mouth 3 (three) times daily 30 minutes before meals 90 tablet 1    Multiple Vitamin (MULTIVITAMIN WITH MINERALS) TABS tablet Take 1 tablet by mouth daily.     omeprazole (PRILOSEC) 40 MG capsule Take 1 capsule (40 mg total) by mouth daily. 180 capsule 1   ondansetron (ZOFRAN ODT) 8 MG disintegrating tablet Take 1 tablet (8 mg total) by mouth every 8 (eight) hours as needed for nausea or vomiting. 30 tablet 0   Semaglutide,0.25 or 0.5MG /DOS, 2 MG/1.5ML SOPN INJECT 0.5 MG INTO THE SKIN ONCE A WEEK. 4.5 mL 3   triamcinolone (NASACORT) 55 MCG/ACT AERO nasal inhaler Place 2 sprays into the nose daily. 1 Inhaler 12   No current facility-administered medications for this visit.      Objective:     Vitals:   05/22/21 1008  BP: 110/80  Pulse: 94  SpO2: 97%  Weight: 144 lb (65.3 kg)  Height: 5\' 1"  (1.549 m)      Body mass index is 27.21 kg/m.    Physical Exam:     General: Well-appearing, cooperative, sitting comfortably in no acute distress.   OMT Physical Exam:  ASIS Compression Test: Positive Right Cervical: TTP paraspinal, C4-6 RRSR Rib: Bilateral elevated first rib with TTP Thoracic: TTP paraspinal, T6-9 RLSR Lumbar: TTP paraspinal, L5 rotated right Pelvis: Right posterior innominate  Electronically signed by:  Rachel [name] D.[facility] 10:29 AM 05/22/21

## 2021-05-22 ENCOUNTER — Telehealth: Payer: Self-pay | Admitting: Neurology

## 2021-05-22 ENCOUNTER — Other Ambulatory Visit (HOSPITAL_COMMUNITY): Payer: Self-pay

## 2021-05-22 ENCOUNTER — Ambulatory Visit (INDEPENDENT_AMBULATORY_CARE_PROVIDER_SITE_OTHER): Payer: 59 | Admitting: Sports Medicine

## 2021-05-22 ENCOUNTER — Other Ambulatory Visit: Payer: Self-pay

## 2021-05-22 ENCOUNTER — Other Ambulatory Visit: Payer: Self-pay | Admitting: Internal Medicine

## 2021-05-22 VITALS — BP 110/80 | HR 94 | Ht 61.0 in | Wt 144.0 lb

## 2021-05-22 DIAGNOSIS — M9902 Segmental and somatic dysfunction of thoracic region: Secondary | ICD-10-CM

## 2021-05-22 DIAGNOSIS — G4486 Cervicogenic headache: Secondary | ICD-10-CM | POA: Diagnosis not present

## 2021-05-22 DIAGNOSIS — M9908 Segmental and somatic dysfunction of rib cage: Secondary | ICD-10-CM

## 2021-05-22 DIAGNOSIS — M9903 Segmental and somatic dysfunction of lumbar region: Secondary | ICD-10-CM | POA: Diagnosis not present

## 2021-05-22 DIAGNOSIS — M9901 Segmental and somatic dysfunction of cervical region: Secondary | ICD-10-CM | POA: Diagnosis not present

## 2021-05-22 DIAGNOSIS — M9905 Segmental and somatic dysfunction of pelvic region: Secondary | ICD-10-CM

## 2021-05-22 MED ORDER — ONDANSETRON HCL 4 MG PO TABS
4.0000 mg | ORAL_TABLET | Freq: Three times a day (TID) | ORAL | 1 refills | Status: DC | PRN
  Filled 2021-05-22: qty 40, 13d supply, fill #0
  Filled 2021-07-27: qty 21, 30d supply, fill #1
  Filled 2021-10-12: qty 19, 7d supply, fill #2

## 2021-05-22 NOTE — Telephone Encounter (Signed)
Patient would like to speak with sheena regarding botox. Said she doesn't know what else needs to be done for her to get it.

## 2021-05-22 NOTE — Telephone Encounter (Signed)
Pt advised to call her Insurance. Received a letter from [facility] Patient has not coverage.  Pt will call her insurance and give [place] a call back.

## 2021-05-22 NOTE — Patient Instructions (Addendum)
Good to see you  1 month follow up for repeat OMT

## 2021-05-22 NOTE — Telephone Encounter (Signed)
Per Pt she was advised she has coverage until 06/13/21, not sure what we are talking about.  Noticed that patient has [employer], Patient will have coverage with them in the new year.   Please ask what will be her coverage in the new year when she will be able to get the botox.

## 2021-06-17 LAB — HM DIABETES EYE EXAM

## 2021-06-26 NOTE — Progress Notes (Signed)
NEUROLOGY FOLLOW UP OFFICE NOTE  Rachel [name] [id number]  Assessment/Plan:   Chronic migraine without Aura, Without Status Migrainosus, Not Intractable - tried multiple preventatives such as topiramate, Depakote, nortriptyline, venlafaxine, atenolol, Aimovig, Emgality - meets criteria for Botox     For preventative management, plan is to still start Botox.  Will need to send for a PA.  In meantime, start propranolol 30mg  twice daily; gabapentin 600mg  BID  For abortive therapy, try eletriptan 40mg ; urged her to continue using Fioricet sparingly.  Stop Excedrin.  Limit use of pain relievers to no more than 2 days out of week to prevent risk of rebound or medication-overuse headache.  Keep headache diary  Exercise, hydration, caffeine cessation, sleep hygiene, monitor for and avoid triggers  Consider:  magnesium citrate 400mg  daily, riboflavin 400mg  daily, and coenzyme Q10 100mg  three times daily Always keep in mind that currently taking a hormone or birth control may be a possible trigger or aggravating factor for migraine. Follow up 6 months.   Subjective:  Rachel [name] is a 56 year old right-handed female with type 2 diabetes, hyperlipidemia, GERD, asthma, and migraine who follows up for migraines.   UPDATE: She was unable to afford Ajovy.  Plan was to restart Botox however, she was changing insurance in January.  No change in migraines.  Daily migraines for well over 3 consecutive months.      Current NSAIDS: none Current analgesics: Excedrin Migraine, Fioricet (sometimes) Current triptans: None Current ergotamine: None Current anti-emetic: promethazine, Reglan Current muscle relaxants: none Current anti-anxiolytic: None Current sleep aide: None Current Antihypertensive medications: None Current Antidepressant medications: Cymbalta 60mg  daily Current Anticonvulsant medications: Gabapentin 600 mg twice daily (for back pain) Current anti-CGRP: none Current  Vitamins/Herbal/Supplements: Turmeric Current Antihistamines/Decongestants: mecllzine, Allegra   Caffeine: One cup of coffee daily Diet: Drinks plenty of water.  No soda. Exercise: No Depression: No; Anxiety: yes.  Her mother has been ill.  She has been confused. Other pain: Knee pain. Sleep hygiene: Poor.  Worrying about her mother.   HISTORY: Onset:  2011.  She does have remote history of headaches when she was younger. Location:  Back of head and radiates to the front Quality:  Shooting up from back of head, pounding on top and front of head Initial Intensity:  8/10 Aura:  no Prodrome:  no Associated symptoms:  Nausea, photophobia, phonophobia, blurred vision (she gets annual eye exams due to diabetes) Initial Duration:  All day Initial Frequency:  Almost daily (cannot function 6 days per month) Triggers/exacerbating factors:  wine Relieving factors:  No Activity:  Cannot work about 6 days per month (she works as an Advertising account planner)   Past NSAIDS: Cambia (made headache worse), flurbiprofen, Sprix nasal spray, ibuprofen; naproxen Past analgesics: Tramadol, Excedrin, Tylenol Past abortive triptans: Sumatriptan 100 mg, Zembrace-SymTouch (did not like the feeling), Zomig 5 mg NS (did not like the way it made her feel), Maxalt (dizziness, ineffective) Past abortive ergotamine: Cafergot Past muscle relaxants: Robaxin, Flexeril Past anti-emetic: Zofran 8mg , Reglan, Phenergan Past antihypertensive medications: Atenolol Past antidepressant medications: Venlafaxine XR 150 mg; nortriptyline Past anticonvulsant medications: Topiramate, Depakote Past anti-CGRP: Aimovig 140mg , Emgality, Nurtec (dizziness) Past vitamins/Herbal/Supplements: None Past antihistamines/decongestants: None Other past therapies: Botox   Frequent Falls: For almost 10 years, she has had falls, but they have become more frequent over the past year.  Previously, she was diagnosed with vertigo.  However, she  reports that she doesn't note spinning or lightheadedness.  She says she "just falls".  On  one occasion, she was pushing the seat forward while sitting and just fell over.  Otherwise, it always occurs while walking or on her feet.  She denies double vision or focal numbness or weakness.  MRI of the brain with seizure protocol was performed on 08/02/11, which was unremarkable.  Sleep deprived EEG was normal.  She has had PT, which was ineffective.  She does report that she sometimes trips while walking but doesn't fall.   Cervical plain films from 05/07/15 showed mild degenerative disc disease at C5-6, but otherwise unremarkable.  Lumbar films from 06/10/15 were personally reviewed and were negative.  She underwent anothner MRI of brain with and without contrast on 02/10/16, and was stable compared to prior MRI from 2013.  NCV-EMG from 05/13/16 was negative for neuropathy or lumbosacral radiculopathy.  MRI cervical spine on 03/31/2019 showed degenerative spondylosis but no significant spinal stenosis or cord compression. Denies diplopia or visual hallucinations.  PAST MEDICAL HISTORY: Past Medical History:  Diagnosis Date   ALLERGIC RHINITIS 10/04/2007   Anemia 01/21/2011   Anxiety 11/15/2018   ASTHMA 08/02/2007   INHALERS ONLY IN [place]   Asthma 08/02/2007   Qualifier: Diagnosis of  By: Wynona Luna    Blood transfusion without reported diagnosis    with first child    Cervical radiculopathy 01/21/2011   Cervicogenic headache 04/27/2017   COMMON MIGRAINE 05/15/2009   Depression 01/09/2010   Qualifier: Diagnosis of  By: Jenny Reichmann MD, [place], TYPE II 08/02/2007   Dysphagia, pharyngoesophageal phase 06/12/2014   GERD 08/02/2007   HYPERLIPIDEMIA 08/02/2007   Hyperlipidemia 08/02/2007   Qualifier: Diagnosis of  By: Wynona Luna    INSOMNIA-SLEEP DISORDER-UNSPEC 01/04/2008   INTERMITTENT VERTIGO 05/15/2009   LIBIDO, DECREASED 01/09/2010   Migraine without aura 05/15/2009   Qualifier:  Diagnosis of  By: Jenny Reichmann MD, [place]    Nonallopathic lesion of rib cage 12/21/2017   Nonallopathic lesion of sacral region 07/29/2015   Nonallopathic lesion of thoracic region 07/29/2015   Osteoporosis 04/25/2019   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 18 g 5   aspirin 81 MG EC tablet Take 81 mg by mouth daily.     Botulinum Toxin Type A (BOTOX) 200 units SOLR Inject 155 units IM Into multiple sites in the face,neck,and head every 90 days 1 each 4   butalbital-acetaminophen-caffeine (FIORICET) 50-325-40 MG tablet Take 1 tablet by mouth every 6 (six) hours as needed for headache. Max 2 tablets per day 30 tablet 3   Cholecalciferol (VITAMIN D) 50 MCG (2000 UT) CAPS Take 4,000 Units by mouth daily.     cyclobenzaprine (FLEXERIL) 5 MG tablet Take 1 tablet (5 mg total) by mouth 3 (three) times daily as needed. for muscle spams 90 tablet 1   Diclofenac Sodium (PENNSAID) 2 % SOLN Place 2 g onto the skin 2 (two) times daily. 112 g 3   DULoxetine (CYMBALTA) 60 MG capsule Take 1  capsule (60 mg total) by mouth daily. 90 capsule 3   Fremanezumab-vfrm (AJOVY) 225 MG/1.5ML SOAJ Inject 225 mg into the skin every 28 (twenty-eight) days. 1.5 mL 5   gabapentin (NEURONTIN) 300 MG capsule TAKE 2 CAPSULES BY MOUTH 2 TIMES DAILY 120 capsule 5   glucose blood (ACCU-CHEK GUIDE) test strip Use as instructed 2x a day 200 each 12   insulin glargine (LANTUS) 100 UNIT/ML Solostar Pen Inject 28 Units into the skin daily.  30 mL 1   Insulin Pen Needle 32G X 4 MM MISC USE AS DIRECTED. 200 each 3   Lasmiditan Succinate (REYVOW) 100 MG TABS Take 100 mg by mouth as needed. Take 1 tablet for headache. No more than 1 tablet in 24 hours 8 tablet 3   linaclotide (LINZESS) 145 MCG CAPS capsule Take 1 capsule (145 mcg total) by mouth daily before breakfast 30 capsule 2   metoCLOPramide (REGLAN) 5 MG tablet Take 1 tablet (5 mg total) by mouth 3 (three) times daily 30 minutes before meals 90 tablet 1   Multiple Vitamin (MULTIVITAMIN WITH MINERALS) TABS tablet Take 1 tablet by mouth daily.     omeprazole (PRILOSEC) 40 MG capsule Take 1 capsule (40 mg total) by mouth daily. 180 capsule 1   ondansetron (ZOFRAN ODT) 8 MG disintegrating tablet Take 1 tablet (8 mg total) by mouth every 8 (eight) hours as needed for nausea or vomiting. 30 tablet 0   ondansetron (ZOFRAN) 4 MG tablet Take 1 tablet (4 mg total) by mouth every 8 (eight) hours as needed for nausea & vomitng 40 tablet 1   Semaglutide,0.25 or 0.5MG /DOS, 2 MG/1.5ML SOPN INJECT 0.5 MG INTO THE SKIN ONCE A WEEK. 4.5 mL 3   triamcinolone (NASACORT) 55 MCG/ACT AERO nasal inhaler Place 2 sprays into the nose daily. 1 Inhaler 12   No current facility-administered medications on file prior to visit.    ALLERGIES: Allergies  Allergen Reactions   Lipitor [Atorvastatin]     REACTION: myalgias   Nitroglycerin     FAMILY HISTORY: Family History  Problem Relation Age of Onset   Hypertension Father    Diabetes Father    Asthma Father    Cervical cancer Maternal Aunt    Heart disease Maternal Aunt    Anesthesia problems Neg Hx    Colon cancer Neg Hx    Breast cancer Neg Hx    Esophageal cancer Neg Hx    Stomach cancer Neg Hx       Objective:  Blood pressure 119/77, pulse (!) 104, height 5\' 1"  (1.549 m), weight 143 lb 3.2 oz (65 kg), SpO2 98 %. General: No acute distress.  Patient appears well-groomed.      Metta Clines, DO  CC: Cathlean Cower, MD

## 2021-06-29 ENCOUNTER — Ambulatory Visit (INDEPENDENT_AMBULATORY_CARE_PROVIDER_SITE_OTHER): Payer: 59 | Admitting: Neurology

## 2021-06-29 ENCOUNTER — Encounter: Payer: Self-pay | Admitting: Neurology

## 2021-06-29 ENCOUNTER — Other Ambulatory Visit: Payer: Self-pay

## 2021-06-29 ENCOUNTER — Other Ambulatory Visit (HOSPITAL_COMMUNITY): Payer: Self-pay

## 2021-06-29 VITALS — BP 119/77 | HR 104 | Ht 61.0 in | Wt 143.2 lb

## 2021-06-29 DIAGNOSIS — G43709 Chronic migraine without aura, not intractable, without status migrainosus: Secondary | ICD-10-CM

## 2021-06-29 MED ORDER — PROPRANOLOL HCL 20 MG PO TABS
30.0000 mg | ORAL_TABLET | Freq: Two times a day (BID) | ORAL | 5 refills | Status: DC
  Filled 2021-06-29: qty 90, 30d supply, fill #0
  Filled 2022-03-23: qty 90, 30d supply, fill #1
  Filled 2022-04-19: qty 54, 18d supply, fill #2
  Filled 2022-04-26 (×2): qty 45, 15d supply, fill #2

## 2021-06-29 MED ORDER — ELETRIPTAN HYDROBROMIDE 40 MG PO TABS
40.0000 mg | ORAL_TABLET | ORAL | 5 refills | Status: DC | PRN
  Filled 2021-06-29 (×2): qty 10, 30d supply, fill #0

## 2021-06-29 NOTE — Patient Instructions (Signed)
Plan to still start Botox In meantime, start propranolol 1 1/2 tablets twice daily Stop Excedrin. At earliest onset of migraine, take eletriptan 40mg .  May repeat after 2 hours (maximum 2 tablets in 24 hours) Limit use of pain relievers to no more than 2 days out of week to prevent risk of rebound or medication-overuse headache. Keep headache diary Follow up 6 months.

## 2021-06-29 NOTE — Progress Notes (Signed)
[place] Phone: [phone] Subjective:   Rachel Rachel [name], am serving as a scribe for Dr. Hulan Rachel [name]. This visit occurred during the SARS-CoV-2 public health emergency.  Safety protocols were in place, including screening questions prior to the visit, additional usage of staff PPE, and extensive cleaning of exam room while observing appropriate contact time as indicated for disinfecting solutions.  I'm seeing this patient by the request  of:  Rachel Borg, MD  CC: Neck pain, allover pain  EUM:[id number]  Rachel Rachel [name] is a 56 y.o. female coming in with complaint of back and neck pain. OMT 03/19/2021. Patient states that she is having a headache today. States her neck is tight.  Patient states feels like she does need manipulation.  Always does feel better afterwards.  Also having B knee pain that is relieved with steroid injections. Interested in gel injections and hoping that her insurance will cover them.  Patient states that the knees are giving her more difficulty again.  Medications patient has been prescribed: Flexeril  Taking:         Reviewed prior external information including notes and imaging from previsou exam, outside providers and external EMR if available.   As well as notes that were available from care everywhere and other healthcare systems.  Past medical history, social, surgical and family history all reviewed in electronic medical record.  Rachel [name] pertanent information unless stated regarding to the chief complaint.   Past Medical History:  Diagnosis Date   ALLERGIC RHINITIS 10/04/2007   Anemia 01/21/2011   Anxiety 11/15/2018   ASTHMA 08/02/2007   INHALERS ONLY IN [place]   Asthma 08/02/2007   Qualifier: Diagnosis of  By: Rachel Rachel [name]    Blood transfusion without reported diagnosis    with first child    Cervical radiculopathy 01/21/2011   Cervicogenic headache 04/27/2017   COMMON  MIGRAINE 05/15/2009   Depression 01/09/2010   Qualifier: Diagnosis of  By: Rachel Reichmann MD, [place], TYPE II 08/02/2007   Dysphagia, pharyngoesophageal phase 06/12/2014   GERD 08/02/2007   HYPERLIPIDEMIA 08/02/2007   Hyperlipidemia 08/02/2007   Qualifier: Diagnosis of  By: Rachel Rachel [name]    INSOMNIA-SLEEP [place] 01/04/2008   INTERMITTENT VERTIGO 05/15/2009   LIBIDO, DECREASED 01/09/2010   Migraine without aura 05/15/2009   Qualifier: Diagnosis of  By: Rachel Reichmann MD, [place]    Nonallopathic lesion of rib cage 12/21/2017   Nonallopathic lesion of sacral region 07/29/2015   Nonallopathic lesion of thoracic region 07/29/2015   Osteoporosis 04/25/2019   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   Rheumatoid factor positive 02/10/2016   Right knee pain 01/31/2019   Injected January 31, 2019   Type 2 diabetes mellitus with hyperglycemia, with long-term current use of insulin ([place]) 08/02/2007   Qualifier: Diagnosis of  By: Rachel Rachel [name]     Allergies  Allergen Reactions   Lipitor [Atorvastatin]     REACTION: myalgias   Nitroglycerin      Review of Systems:  Rachel [name] visual changes, nausea, vomiting, diarrhea, constipation, dizziness, abdominal pain, skin rash, fevers, chills, night sweats, weight loss, swollen lymph nodes, joint swelling, chest pain, shortness of breath, mood changes. POSITIVE muscle aches, body aches, headaches  Objective  Blood pressure 124/84, pulse 100, height 5\' 1"  (1.549 m), weight 142 lb (64.4 kg), SpO2 98 %.   General: Rachel [name] apparent distress alert and oriented x3 mood  and affect normal, dressed appropriately.  HEENT: Pupils equal, extraocular movements intact  Respiratory: Patient's speak in full sentences and does not appear short of breath  Cardiovascular: Rachel [name] lower extremity edema, non tender, Rachel [name] erythema  Neck exam has significant tenderness to palpation in the paraspinal musculature.  Tightness noted in the parascapular region bilaterally as  well.  Osteopathic findings  C2 flexed rotated and side bent right C3 flexed rotated and side bent right. C6 flexed rotated and side bent left T3 extended rotated and side bent right inhaled rib T9 extended rotated and side bent left inhaled rib L2 flexed rotated and side bent right Sacrum right on right       Assessment and Plan:  Cervicogenic headache Chronic problem with exacerbation.  Patient does have the gabapentin, duloxetine, as well as cyclobenzaprine.  Discussed icing regimen and home exercises.  Discussed which activities to doing which ones to avoid.  We will follow-up with me again in 6 to 8 weeks.    Nonallopathic problems  Decision today to treat with OMT was based on Physical Exam  After verbal consent patient was treated with HVLA, ME, FPR techniques in cervical, rib, thoracic, lumbar, and sacral  areas  Patient tolerated the procedure well with improvement in symptoms  Patient given exercises, stretches and lifestyle modifications  See medications in patient instructions if given  Patient will follow up in 4-8 weeks      The above documentation has been reviewed and is accurate and complete Rachel Pulley, DO       Note: This dictation was prepared with Dragon dictation along with smaller phrase technology. Any transcriptional errors that result from this process are unintentional.

## 2021-06-30 ENCOUNTER — Ambulatory Visit (INDEPENDENT_AMBULATORY_CARE_PROVIDER_SITE_OTHER): Payer: 59 | Admitting: Family Medicine

## 2021-06-30 ENCOUNTER — Encounter: Payer: Self-pay | Admitting: Family Medicine

## 2021-06-30 VITALS — BP 124/84 | HR 100 | Ht 61.0 in | Wt 142.0 lb

## 2021-06-30 DIAGNOSIS — M9901 Segmental and somatic dysfunction of cervical region: Secondary | ICD-10-CM

## 2021-06-30 DIAGNOSIS — M9904 Segmental and somatic dysfunction of sacral region: Secondary | ICD-10-CM

## 2021-06-30 DIAGNOSIS — M9908 Segmental and somatic dysfunction of rib cage: Secondary | ICD-10-CM

## 2021-06-30 DIAGNOSIS — M9902 Segmental and somatic dysfunction of thoracic region: Secondary | ICD-10-CM | POA: Diagnosis not present

## 2021-06-30 DIAGNOSIS — M9903 Segmental and somatic dysfunction of lumbar region: Secondary | ICD-10-CM

## 2021-06-30 DIAGNOSIS — G4486 Cervicogenic headache: Secondary | ICD-10-CM | POA: Diagnosis not present

## 2021-06-30 NOTE — Assessment & Plan Note (Signed)
Chronic problem with exacerbation.  Patient does have the gabapentin, duloxetine, as well as cyclobenzaprine.  Discussed icing regimen and home exercises.  Discussed which activities to doing which ones to avoid.  We will follow-up with me again in 6 to 8 weeks.

## 2021-06-30 NOTE — Patient Instructions (Signed)
Will run for gel You are ball of tightness  See me in 4-6 weeks

## 2021-07-01 ENCOUNTER — Encounter: Payer: Self-pay | Admitting: Gastroenterology

## 2021-07-10 ENCOUNTER — Ambulatory Visit: Payer: 59 | Admitting: Neurology

## 2021-07-20 ENCOUNTER — Telehealth: Payer: Self-pay | Admitting: Neurology

## 2021-07-20 ENCOUNTER — Other Ambulatory Visit: Payer: Self-pay | Admitting: Neurology

## 2021-07-20 NOTE — Telephone Encounter (Signed)
Per pt the Eletriptan make her feels sick, The Propranolol 30 BID hasn't helped.    Pt advised Per [facility] need copy of medicaid card if she has it. Pt will bring it.

## 2021-07-20 NOTE — Telephone Encounter (Signed)
Patient needs to know about her botox. Was she approved?

## 2021-07-21 NOTE — Telephone Encounter (Signed)
She needs to give propranolol dose 4 weeks and then we can increase dose if needed.  If eletriptan ineffective, we can prescribe sumatriptan 20mg  nasal spray - 1 spray (5mg ) in each nostril.  May repeat once after 1 hour.  Maximum 40mg  in 24 hours.  Quantity 6, refills 5.    Advised pt of Dr.Jaffe note above.  Per pt she tried the Nasal spray already and she did not like the burning feeling she got in her nostrils.   Also advised pt to please upload her medicaid card as well so we can check her insurance for Botox.   Please advsied.

## 2021-07-22 ENCOUNTER — Telehealth: Payer: Self-pay

## 2021-07-22 NOTE — Telephone Encounter (Signed)
New message   Call [employer] at  [phone] to check the status of active insurance.   Spoke with Marlowe Kays - active insurance since 06/14/2021.  Ref # [id number]  Completed and fax authorization form to  [phone]  Medication Botox

## 2021-07-22 NOTE — Telephone Encounter (Signed)
Those were different nasal sprays.  But if she does not want to try a different nasal spray,then please send in a prescription for Ubrelvy 100mg  (take as needed.  May repeat after 2 hours.  2 tablets in 24 hours.  Quantity 16, refill 5).  However, this will need a PA and uncertain if it will be covered by her insurance.     Pt advised of the denial of her insurance for Botox. If she thinks her MDCD is not Family planning and it is full MDCD she needs to upload her insurance card or bring It to the office so we can make sure.   If not Patient may need to do as Dr.Jaffe recommended in last note,  She needs to give propranolol dose 4 weeks and then we can increase dose if needed.  If eletriptan ineffective, we can prescribe sumatriptan 20mg  nasal spray - 1 spray (5mg ) in each nostril.  May repeat once after 1 hour.  Maximum 40mg  in 24 hours.  Quantity 6, refills 5.    Also I advised pt if she wants to try Urbelvy please let us know. It may need a PA as well.

## 2021-07-22 NOTE — Telephone Encounter (Signed)
F/u  July 22, 2021 Me   GD   10:28 AM Note New message    Call [employer] at  [phone] to check the status of active insurance.    Spoke with Marlowe Kays - active insurance since 06/14/2021.   Ref # [id number]   Completed and fax authorization form to  [phone]   Medication Botox     Received fax back from [employer] we decided that the services or treatment described above are requested to be performed at an out of network facility. This member has an EPO plan which does not allow out of network benefits , unless services are unavailable in network. This means that we did not approve the services or treatment.

## 2021-07-27 ENCOUNTER — Other Ambulatory Visit (HOSPITAL_COMMUNITY): Payer: Self-pay

## 2021-07-27 ENCOUNTER — Other Ambulatory Visit: Payer: Self-pay | Admitting: Internal Medicine

## 2021-07-27 MED ORDER — OZEMPIC (0.25 OR 0.5 MG/DOSE) 2 MG/1.5ML ~~LOC~~ SOPN
0.5000 mg | PEN_INJECTOR | SUBCUTANEOUS | 3 refills | Status: DC
Start: 2021-07-27 — End: 2021-10-14
  Filled 2021-07-27 – 2021-07-28 (×2): qty 4.5, 84d supply, fill #0
  Filled 2021-10-13: qty 4.5, 84d supply, fill #1

## 2021-07-27 MED ORDER — ACCU-CHEK GUIDE VI STRP
1.0000 | ORAL_STRIP | Freq: Two times a day (BID) | 12 refills | Status: DC
  Filled 2021-07-27: qty 200, 100d supply, fill #0

## 2021-07-27 NOTE — Progress Notes (Deleted)
[place] Phone: [phone] Subjective:    I'm seeing this patient by the request  of:  Biagio Borg, MD  CC:   YIA:[id number]  Rachel [name] is a 56 y.o. female coming in with complaint of B knee pain. Patient last seen in January 2023 for OMT. [employer] covered for patient. Patient states       Past Medical History:  Diagnosis Date   ALLERGIC RHINITIS 10/04/2007   Anemia 01/21/2011   Anxiety 11/15/2018   ASTHMA 08/02/2007   INHALERS ONLY IN [place]   Asthma 08/02/2007   Qualifier: Diagnosis of  By: Wynona Luna    Blood transfusion without reported diagnosis    with first child    Cervical radiculopathy 01/21/2011   Cervicogenic headache 04/27/2017   COMMON MIGRAINE 05/15/2009   Depression 01/09/2010   Qualifier: Diagnosis of  By: Jenny Reichmann MD, [place], TYPE II 08/02/2007   Dysphagia, pharyngoesophageal phase 06/12/2014   GERD 08/02/2007   HYPERLIPIDEMIA 08/02/2007   Hyperlipidemia 08/02/2007   Qualifier: Diagnosis of  By: Wynona Luna    INSOMNIA-SLEEP DISORDER-UNSPEC 01/04/2008   INTERMITTENT VERTIGO 05/15/2009   LIBIDO, DECREASED 01/09/2010   Migraine without aura 05/15/2009   Qualifier: Diagnosis of  By: Jenny Reichmann MD, [place]    Nonallopathic lesion of rib cage 12/21/2017   Nonallopathic lesion of sacral region 07/29/2015   Nonallopathic lesion of thoracic region 07/29/2015   Osteoporosis 04/25/2019   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   Rheumatoid factor positive 02/10/2016   Right knee pain 01/31/2019   Injected January 31, 2019   Type 2 diabetes mellitus with hyperglycemia, with long-term current use of insulin ([place]) 08/02/2007   Qualifier: Diagnosis of  By: Wynona Luna    Past Surgical History:  Procedure Laterality Date   CESAREAN SECTION     x 3   CYST EXCISION     left knee   PAROTIDECTOMY  10/21/2011   Procedure: PAROTIDECTOMY;  Surgeon: Melida Quitter, MD;   Location: [facility] OR;  Service: ENT;  Laterality: Left;   Social History   Socioeconomic History   Marital status: Married    Spouse name: [name]   Number of children: 3   Years of education: Degree   Highest education level: Not on file  Occupational History   Occupation: Chartered certified accountant    Employer: [employer]   Occupation: [employer]    Employer: [employer]  Tobacco Use   Smoking status: Never   Smokeless tobacco: Never  Vaping Use   Vaping Use: Never used  Substance and Sexual Activity   Alcohol use: No    Alcohol/week: 0.0 standard drinks   Drug use: No   Sexual activity: Not on file  Other Topics Concern   Not on file  Social History Narrative   She has 3 daughters.   Patient has a 4 year degree.    Patient working at [employer].    Patient is married to Arjay.   Social Determinants of Health   Financial Resource Strain: Not on file  Food Insecurity: Not on file  Transportation Needs: Not on file  Physical Activity: Not on file  Stress: Not on file  Social Connections: Not on file   Allergies  Allergen Reactions   Lipitor [Atorvastatin]     REACTION: myalgias   Nitroglycerin    Family History  Problem  Relation Age of Onset   Hypertension Father    Diabetes Father    Asthma Father    Cervical cancer Maternal Aunt    Heart disease Maternal Aunt    Anesthesia problems Neg Hx    Colon cancer Neg Hx    Breast cancer Neg Hx    Esophageal cancer Neg Hx    Stomach cancer Neg Hx     Current Outpatient Medications (Endocrine & Metabolic):    insulin glargine (LANTUS) 100 UNIT/ML Solostar Pen, Inject 28 Units into the skin daily.  Current Outpatient Medications (Cardiovascular):    propranolol (INDERAL) 20 MG tablet, Take 1.5 tablets (30 mg total) by mouth 2 (two) times daily.  Current Outpatient Medications (Respiratory):    albuterol (VENTOLIN HFA) 108 (90 Base) MCG/ACT inhaler, INHALE 2 PUFFS INTO THE LUNGS EVERY 6 (SIX) HOURS AS NEEDED FOR  WHEEZING OR SHORTNESS OF BREATH.   triamcinolone (NASACORT) 55 MCG/ACT AERO nasal inhaler, Place 2 sprays into the nose daily.  Current Outpatient Medications (Analgesics):    aspirin 81 MG EC tablet, Take 81 mg by mouth daily.   butalbital-acetaminophen-caffeine (FIORICET) 50-325-40 MG tablet, Take 1 tablet by mouth every 6 (six) hours as needed for headache. Max 2 tablets per day   eletriptan (RELPAX) 40 MG tablet, Take 1 tablet (40 mg total) by mouth as needed for migraine or headache (May repeat after 2 hours.  Maximum 2 tablets in 24 hours). May repeat in 2 hours if headache persists or recurs.   Current Outpatient Medications (Other):    Botulinum Toxin Type A (BOTOX) 200 units SOLR, Inject 155 units IM Into multiple sites in the face,neck,and head every 90 days   Cholecalciferol (VITAMIN D) 50 MCG (2000 UT) CAPS, Take 4,000 Units by mouth daily.   cyclobenzaprine (FLEXERIL) 5 MG tablet, Take 1 tablet (5 mg total) by mouth 3 (three) times daily as needed. for muscle spams   Diclofenac Sodium (PENNSAID) 2 % SOLN, Place 2 g onto the skin 2 (two) times daily.   DULoxetine (CYMBALTA) 60 MG capsule, Take 1 capsule (60 mg total) by mouth daily.   gabapentin (NEURONTIN) 300 MG capsule, TAKE 2 CAPSULES BY MOUTH 2 TIMES DAILY   glucose blood (ACCU-CHEK GUIDE) test strip, Use as instructed 2x a day   Insulin Pen Needle 32G X 4 MM MISC, USE AS DIRECTED.   linaclotide (LINZESS) 145 MCG CAPS capsule, Take 1 capsule (145 mcg total) by mouth daily before breakfast   metoCLOPramide (REGLAN) 5 MG tablet, Take 1 tablet (5 mg total) by mouth 3 (three) times daily 30 minutes before meals   Multiple Vitamin (MULTIVITAMIN WITH MINERALS) TABS tablet, Take 1 tablet by mouth daily.   omeprazole (PRILOSEC) 40 MG capsule, Take 1 capsule (40 mg total) by mouth daily.   ondansetron (ZOFRAN ODT) 8 MG disintegrating tablet, Take 1 tablet (8 mg total) by mouth every 8 (eight) hours as needed for nausea or vomiting.    ondansetron (ZOFRAN) 4 MG tablet, Take 1 tablet (4 mg total) by mouth every 8 (eight) hours as needed for nausea & vomitng   Reviewed prior external information including notes and imaging from  primary care provider As well as notes that were available from care everywhere and other healthcare systems.  Past medical history, social, surgical and family history all reviewed in electronic medical record.  No pertanent information unless stated regarding to the chief complaint.   Review of Systems:  No headache, visual changes, nausea, vomiting, diarrhea, constipation, dizziness,  abdominal pain, skin rash, fevers, chills, night sweats, weight loss, swollen lymph nodes, body aches, joint swelling, chest pain, shortness of breath, mood changes. POSITIVE muscle aches  Objective  There were no vitals taken for this visit.   General: No apparent distress alert and oriented x3 mood and affect normal, dressed appropriately.  HEENT: Pupils equal, extraocular movements intact  Respiratory: Patient's speak in full sentences and does not appear short of breath  Cardiovascular: No lower extremity edema, non tender, no erythema  Gait normal with good balance and coordination.  MSK:  Non tender with full range of motion and good stability and symmetric strength and tone of shoulders, elbows, wrist, hip, knee and ankles bilaterally.     Impression and Recommendations:     The above documentation has been reviewed and is accurate and complete Jacqualin Combes

## 2021-07-28 ENCOUNTER — Telehealth: Payer: Self-pay

## 2021-07-28 ENCOUNTER — Ambulatory Visit: Payer: Medicaid Other | Admitting: Family Medicine

## 2021-07-28 ENCOUNTER — Other Ambulatory Visit (HOSPITAL_COMMUNITY): Payer: Self-pay
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
## 2021-07-28 NOTE — Telephone Encounter (Signed)
Patient Advocate Encounter  Prior Authorization for Ozempic 2mg /1.52ml pen injector has been approved.    PA# [id number]  Effective dates: 07/28/21 through 07/28/22  Per Test Claim Patients co-pay is $75.   Spoke with Pharmacy to Process.  Patient Advocate Fax: [phone]

## 2021-07-28 NOTE — Telephone Encounter (Signed)
Patient Advocate Encounter   Received notification from [facility] that prior authorization for Ozempic pen injector is required by his/her [employer].   PA submitted on 07/28/21  Key#: [id number]  Status is pending    [facility] will continue to follow:  Patient Advocate Fax: [phone]

## 2021-07-29 ENCOUNTER — Ambulatory Visit (INDEPENDENT_AMBULATORY_CARE_PROVIDER_SITE_OTHER): Payer: 59 | Admitting: Family Medicine

## 2021-07-29 ENCOUNTER — Other Ambulatory Visit: Payer: Self-pay

## 2021-07-29 VITALS — BP 122/84 | HR 123 | Ht 61.0 in | Wt 143.0 lb

## 2021-07-29 DIAGNOSIS — M9908 Segmental and somatic dysfunction of rib cage: Secondary | ICD-10-CM | POA: Diagnosis not present

## 2021-07-29 DIAGNOSIS — M9904 Segmental and somatic dysfunction of sacral region: Secondary | ICD-10-CM

## 2021-07-29 DIAGNOSIS — M9903 Segmental and somatic dysfunction of lumbar region: Secondary | ICD-10-CM | POA: Diagnosis not present

## 2021-07-29 DIAGNOSIS — M9902 Segmental and somatic dysfunction of thoracic region: Secondary | ICD-10-CM

## 2021-07-29 DIAGNOSIS — G4486 Cervicogenic headache: Secondary | ICD-10-CM | POA: Diagnosis not present

## 2021-07-29 DIAGNOSIS — M9901 Segmental and somatic dysfunction of cervical region: Secondary | ICD-10-CM | POA: Diagnosis not present

## 2021-07-29 MED ORDER — KETOROLAC TROMETHAMINE 60 MG/2ML IM SOLN
60.0000 mg | Freq: Once | INTRAMUSCULAR | Status: AC
  Administered 2021-07-29: 60 mg via INTRAMUSCULAR

## 2021-07-29 MED ORDER — METHYLPREDNISOLONE ACETATE 80 MG/ML IJ SUSP
80.0000 mg | Freq: Once | INTRAMUSCULAR | Status: AC
  Administered 2021-07-29: 80 mg via INTRAMUSCULAR
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
## 2021-07-29 NOTE — Patient Instructions (Addendum)
Injection today Flexeril for 3-5 nights Do prescribed exercises at least 3x a week See you again in 4-6 weeks

## 2021-07-30 ENCOUNTER — Other Ambulatory Visit: Payer: Self-pay

## 2021-07-30 ENCOUNTER — Other Ambulatory Visit (HOSPITAL_COMMUNITY): Payer: Self-pay

## 2021-07-30 DIAGNOSIS — Z794 Long term (current) use of insulin: Secondary | ICD-10-CM

## 2021-07-30 DIAGNOSIS — E1165 Type 2 diabetes mellitus with hyperglycemia: Secondary | ICD-10-CM

## 2021-07-30 MED ORDER — BLOOD GLUCOSE MONITOR SYSTEM W/DEVICE KIT
PACK | 0 refills | Status: DC
  Filled 2021-07-30: qty 1, 1d supply, fill #0

## 2021-07-30 MED ORDER — CONTOUR NEXT TEST VI STRP
ORAL_STRIP | 3 refills | Status: DC
  Filled 2021-07-30: qty 200, 90d supply, fill #0
  Filled 2022-03-23: qty 200, 90d supply, fill #1

## 2021-08-04 ENCOUNTER — Ambulatory Visit (INDEPENDENT_AMBULATORY_CARE_PROVIDER_SITE_OTHER): Payer: 59 | Admitting: Internal Medicine

## 2021-08-04 ENCOUNTER — Encounter: Payer: Self-pay | Admitting: Internal Medicine

## 2021-08-04 ENCOUNTER — Other Ambulatory Visit: Payer: Self-pay

## 2021-08-04 VITALS — BP 130/80 | HR 102 | Ht 61.0 in | Wt 143.0 lb

## 2021-08-04 DIAGNOSIS — G629 Polyneuropathy, unspecified: Secondary | ICD-10-CM

## 2021-08-04 DIAGNOSIS — E782 Mixed hyperlipidemia: Secondary | ICD-10-CM | POA: Diagnosis not present

## 2021-08-04 DIAGNOSIS — G63 Polyneuropathy in diseases classified elsewhere: Secondary | ICD-10-CM

## 2021-08-04 DIAGNOSIS — E1165 Type 2 diabetes mellitus with hyperglycemia: Secondary | ICD-10-CM

## 2021-08-04 DIAGNOSIS — Z794 Long term (current) use of insulin: Secondary | ICD-10-CM

## 2021-08-04 DIAGNOSIS — Z8639 Personal history of other endocrine, nutritional and metabolic disease: Secondary | ICD-10-CM

## 2021-08-04 LAB — POCT GLYCOSYLATED HEMOGLOBIN (HGB A1C): Hemoglobin A1C: 6.8 % — AB (ref 4.0–5.6)

## 2021-08-04 LAB — VITAMIN B12: Vitamin B-12: 353 pg/mL (ref 211–911)

## 2021-08-04 NOTE — Addendum Note (Signed)
Addended by: Lauralyn Primes on: 08/04/2021 11:02 AM   Modules accepted: Orders

## 2021-08-04 NOTE — Progress Notes (Addendum)
Subjective:     Patient ID: Rachel [name], female   DOB: [birth year], 56 y.o.   MRN: [id number]  This visit occurred during the SARS-CoV-2 public health emergency.  Safety protocols were in place, including screening questions prior to the visit, additional usage of staff PPE, and extensive cleaning of exam room while observing appropriate contact time as indicated for disinfecting solutions.   HPI Ms. Rachel [name] is a  56 y.o. woman, returning for f/u for DM2, dx 1989, uncontrolled, insulin-dependent, with probable complications (? peripheral neuropathy, ?  Gastroparesis).  Last visit 4 months ago.  Interim history: No increased urination, blurry vision, chest pain. Constipation improved on Linzess.  Also, nausea improved. She has a little cough in the last few days.  At home COVID test was negative. She has neck pain and sees sports medicine (Dr. [name]). Had 2 steroid inj.  Reviewed HbA1c levels: Lab Results  Component Value Date   HGBA1C 6.7 (A) 03/30/2021   HGBA1C 8.3 (H) 12/30/2020   HGBA1C 6.6 (A) 09/16/2020  02/17/2017: HbA1c calculated from fructosamine is better, at 8.34%, but still high. Prev. 10.1%.  Reviewed history: She came off all DM medicines for 6 mo before the HbA1c in 09/2013. She again ran out of all meds in 04/2016.  She is  on: - Lantus 40 >> 34 >> 36 >> 32 >> 28 units in a.m. - Ozempic 0.5 mg weekly - added 06/2019 >> 0.25 >> 0.5 mg weekly  Stopped Januvia 100 mg in am >> then Ozempic 0.5 mg weekly She is off Metformin XR 1000 mg 2x a day with b'fast and dinner >> upset stomach >> stopped 27/7824 We triedTrulicity 1.5 mg weekly >> worked great but had to stop b/c GERD >> stopped. We tried Jardiance 25 mg daily >> started 08/2016 >> but stopped recently 2/2 recurrent UTIs She was on Glipizide 5 mg bid - added 04/2015 >> was not taking it b/c low CBGs. We stopped Glipizide XL 5 mg in 07/2014. She had episodes of hypoglycemia with Amaryl in the past. She was on  Bydureon 2 mg weekly (had nausea). Previously on Humalog, then NovoLog -started 12/2020  She checks her sugars 1-2 times a day: - am: 66, 74, 84-141, 145 >> 88-126, 137, 200 >> 96-138, 160 (steroid inj) - after b'fast: 158-251 >> n/c - before lunch: 180-200 >> n/c >> 60 x1 >> n/c - after lunch: up to 200s >> n/c - before dinner:  180-200 >> 137, 150 >> 140-150  >> n/c - after dinner: 166-245 >> n/c - bedtime: n/c >> 300 >> 100-125 (has snack) >> n/c She has hypoglycemia awareness in the 90s. Lowest: 38 (while on Trulicity) >> ... 49 (05/2020) at night - lost consciousness x2 >> 88 >> 96. Highest 600 >> .Marland Kitchen.  150 >> HI >> 210 (Chinese food) >> 160.  She saw nutrition in the past.  -+ HL. Last lipids: Lab Results  Component Value Date   CHOL 229 (H) 08/28/2020   HDL 57.10 08/28/2020   LDLCALC 151 (H) 08/28/2020   LDLDIRECT 191.0 08/23/2018   TRIG 103.0 08/28/2020   CHOLHDL 4 08/28/2020  On Crestor 40 (ran out Crestor) >> restarted.  -No CKD: Lab Results  Component Value Date   BUN 11 12/30/2020   Lab Results  Component Value Date   CREATININE 0.72 12/30/2020   -+ Numbness and tingling in the right leg.  Up-to-date with foot exam.  -Latest eye appointment was in 06/2020: No DR  She had a gastric emptying study (02/03/2021) showed gastroparesis (but checked on Ozempic!). Started on Reglan, but not using it consistently. 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  Review of Systems + see HPI  I reviewed pt's medications, allergies, PMH, social hx, family hx, and changes were documented in the history of present illness. Otherwise, unchanged from my initial visit note.  Past Medical History:  Diagnosis Date   ALLERGIC RHINITIS 10/04/2007   Anemia 01/21/2011   Anxiety 11/15/2018   ASTHMA 08/02/2007   INHALERS ONLY IN [place]   Asthma 08/02/2007   Qualifier: Diagnosis of  By: Wynona Luna    Blood transfusion without reported diagnosis    with first child    Cervical  radiculopathy 01/21/2011   Cervicogenic headache 04/27/2017   COMMON MIGRAINE 05/15/2009   Depression 01/09/2010   Qualifier: Diagnosis of  By: Jenny Reichmann MD, 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   Rheumatoid factor positive 02/10/2016   Right knee pain 01/31/2019   Injected January 31, 2019   Type 2 diabetes mellitus with hyperglycemia, with long-term current use of insulin ([place]) 08/02/2007   Qualifier: Diagnosis of  By: Wynona Luna    Past Surgical History:  Procedure Laterality Date   CESAREAN SECTION     x 3   CYST EXCISION     left knee   PAROTIDECTOMY  10/21/2011   Procedure: PAROTIDECTOMY;  Surgeon: Melida Quitter, MD;  Location: [facility] OR;  Service: ENT;  Laterality: Left;   Social History   Socioeconomic History   Marital status: Married    Spouse name: [name]   Number of children: 3   Years of education: Degree   Highest education level: Not on file  Occupational History   Occupation: Chartered certified accountant    Employer: [employer]   Occupation: [employer]    Employer: [employer]  Tobacco Use   Smoking status: Never   Smokeless tobacco: Never  Vaping Use   Vaping Use: Never used  Substance and Sexual Activity   Alcohol use: No    Alcohol/week: 0.0 standard drinks   Drug use: No   Sexual activity: Not on file  Other Topics Concern   Not on file  Social History Narrative   She has  3 daughters.   Patient has a 4 year degree.    Patient working at [employer].    Patient is married to Schlusser.   Social Determinants of Health   Financial Resource Strain: Not on file  Food Insecurity: Not on file  Transportation Needs: Not on file  Physical Activity: Not on file  Stress: Not on file  Social Connections: Not on file  Intimate Partner Violence: Not on file   Current Outpatient Medications on File Prior to Visit  Medication Sig Dispense Refill   albuterol (VENTOLIN HFA) 108 (90 Base) MCG/ACT inhaler INHALE  2 PUFFS INTO THE LUNGS EVERY 6 (SIX) HOURS AS NEEDED FOR WHEEZING OR SHORTNESS OF BREATH. 18 g 5   aspirin 81 MG EC tablet Take 81 mg by mouth daily.     Blood Glucose Monitoring Suppl (BLOOD GLUCOSE MONITOR SYSTEM) w/Device KIT Use as instructed to check blood sugar 2 times daily 1 kit 0   Botulinum Toxin Type A (BOTOX) 200 units SOLR Inject 155 units IM Into multiple sites in the face,neck,and head every 90 days 1 each 4   butalbital-acetaminophen-caffeine (FIORICET) 50-325-40 MG tablet Take 1 tablet by mouth every 6 (six) hours as needed for headache. Max 2 tablets per day 30 tablet 3   Cholecalciferol (VITAMIN D) 50 MCG (2000 UT) CAPS Take 4,000 Units by mouth daily.     cyclobenzaprine (FLEXERIL) 5 MG tablet Take 1 tablet (5 mg total) by mouth 3 (three) times daily as needed. for muscle spams 90 tablet 1   Diclofenac Sodium (PENNSAID) 2 % SOLN Place 2 g onto the skin 2 (two) times daily. 112 g 3   DULoxetine (CYMBALTA) 60 MG capsule Take 1 capsule (60 mg total) by mouth daily. 90 capsule 3   eletriptan (RELPAX) 40 MG tablet Take 1 tablet (40 mg total) by mouth as needed for migraine or headache (May repeat after 2 hours.  Maximum 2 tablets in 24 hours). May repeat in 2 hours if headache persists or recurs. 10 tablet 5   gabapentin (NEURONTIN) 300 MG capsule TAKE 2 CAPSULES BY MOUTH 2 TIMES DAILY 120 capsule 5   glucose blood (CONTOUR NEXT TEST) test strip Use as  instructed to check blood sugar 2 times daily 200 each 3   insulin glargine (LANTUS) 100 UNIT/ML Solostar Pen Inject 28 Units into the skin daily. 30 mL 1   Insulin Pen Needle 32G X 4 MM MISC USE AS DIRECTED. 200 each 3   linaclotide (LINZESS) 145 MCG CAPS capsule Take 1 capsule (145 mcg total) by mouth daily before breakfast 30 capsule 2   metoCLOPramide (REGLAN) 5 MG tablet Take 1 tablet (5 mg total) by mouth 3 (three) times daily 30 minutes before meals 90 tablet 1   Multiple Vitamin (MULTIVITAMIN WITH MINERALS) TABS tablet Take 1 tablet by mouth daily.     omeprazole (PRILOSEC) 40 MG capsule Take 1 capsule (40 mg total) by mouth daily. 180 capsule 1   ondansetron (ZOFRAN ODT) 8 MG disintegrating tablet Take 1 tablet (8 mg total) by mouth every 8 (eight) hours as needed for nausea or vomiting. 30 tablet 0   ondansetron (ZOFRAN) 4 MG tablet Take 1 tablet (4 mg total) by mouth every 8 (eight) hours as needed for nausea & vomitng 40 tablet 1   propranolol (INDERAL) 20 MG tablet Take 1.5 tablets (30 mg total) by mouth 2 (two) times daily. 90 tablet 5   Semaglutide,0.25 or 0.5MG /DOS, (OZEMPIC, 0.25 OR 0.5 MG/DOSE,) 2 MG/1.5ML SOPN Inject 0.5 mg into the skin once a week. 4.5 mL 3   triamcinolone (NASACORT) 55 MCG/ACT AERO nasal inhaler Place 2 sprays into the nose daily. 1 Inhaler 12   No current facility-administered medications on file prior to visit.   Allergies  Allergen Reactions   Lipitor [Atorvastatin]     REACTION: myalgias   Nitroglycerin    Family History  Problem Relation Age of Onset   Hypertension Father    Diabetes Father    Asthma Father    Cervical cancer Maternal Aunt    Heart disease Maternal Aunt  Anesthesia problems Neg Hx    Colon cancer Neg Hx    Breast cancer Neg Hx    Esophageal cancer Neg Hx    Stomach cancer Neg Hx     Objective:   Physical Exam BP 130/80 (BP Location: Right Arm, Patient Position: Sitting, Cuff Size: Normal)    Pulse (!) 102    Ht $R'5\' 1"'Ti$   (1.549 m)    Wt 143 lb (64.9 kg)    SpO2 98%    BMI 27.02 kg/m   Wt Readings from Last 3 Encounters:  08/04/21 143 lb (64.9 kg)  07/29/21 143 lb (64.9 kg)  06/30/21 142 lb (64.4 kg)   Constitutional: + slightly overweight, in NAD Eyes: PERRLA, EOMI, no exophthalmos ENT: moist mucous membranes, no thyromegaly, no cervical lymphadenopathy Cardiovascular: tachycardia, RR, No MRG Respiratory: CTA B Musculoskeletal: no deformities, strength intact in all 4 Skin: moist, warm, no rashes Neurological: no tremor with outstretched hands, DTR normal in all 4  Assessment:     1. DM2, uncontrolled, insulin-dependent, with probable complications - ? peripheral neuropathy  2. HL  3.  Overweight  4. PN  Plan:     1. Patient with history of type 2 diabetes, uncontrolled, on basal insulin and weekly GLP-1 receptor agonist, with improved control at last visit.  At that time, HbA1c was 6.7%, decreased from 8.3%.  Sugars were at goal in the morning, with few exceptions, and she was not checking later in the day.  I strongly advised her to start.  Otherwise, we did not change her regimen. -Of note, she had nausea, vomiting, and bloating initially on Ozempic but these resolved.  She had a gastric emptying study that showed delay gastric emptying, but this was checked while she was on Ozempic.  I explained that this is expected in one of the mechanisms by which the medication improves blood sugars.  She does have constipation, for which she takes Linzess.  This improved lately. -At today's visit, sugars are mostly at goal, but she still only checks in the morning.  She has an occasional higher blood sugar in the morning, especially lately after steroid injections.  For now, I would not suggest to change her regimen. - I advised her to: Patient Instructions  Please continue: - Lantus 28 units in a.m. - Ozempic 0.5 mg weekly in a.m.  Please return in 3-4 months with your sugar log.   - we checked her  HbA1c: 6.8% (slightly higher) - advised to check sugars at different times of the day - 1x a day, rotating check times - advised for yearly eye exams >> she is UTD - return to clinic in 3-4 months  2. HL -Reviewed latest lipid panel from 08/2020: LDL high, but improved from 2020: Lab Results  Component Value Date   CHOL 229 (H) 08/28/2020   HDL 57.10 08/28/2020   LDLCALC 151 (H) 08/28/2020   LDLDIRECT 191.0 08/23/2018   TRIG 103.0 08/28/2020   CHOLHDL 4 08/28/2020  -On Crestor 40 mg daily.  No side effects.  She was missing doses in the past. -She has an appointment coming up with PCP soon  3.  Overweight -We will continue Ozempic which should also help with weight loss -Weight was stable at last visit and again today  4. PN -will check a B12 since she does have a history of B12 deficiency -rec'd ALA 600 mg 2x a day if the B12 is normal  Component     Latest Ref Rng &  Units 08/04/2021          Vitamin B12     211 - 911 pg/mL 353  B12 is normal but lower in the normal range.  I will advise her to try to take a B complex.  Philemon Kingdom, MD PhD [facility] Endocrinology

## 2021-08-04 NOTE — Patient Instructions (Addendum)
You can try Alpha lipoic acid 600 mg 2x a day for neuropathy.  Please stop at the lab.  Please continue: - Lantus 28 units in a.m. - Ozempic 0.5 mg weekly in a.m.  Please return in 3-4 months with your sugar log.

## 2021-08-09 IMAGING — DX DG CERVICAL SPINE COMPLETE 4+V
5 series · 5 of 5 positions shown · non-contrast
Comparison: None.

CLINICAL DATA: Generalized neck pain 2 weeks.  Fall 1 month ago.

EXAM:
CERVICAL SPINE - COMPLETE 4+ VIEW

[c-spine lat]
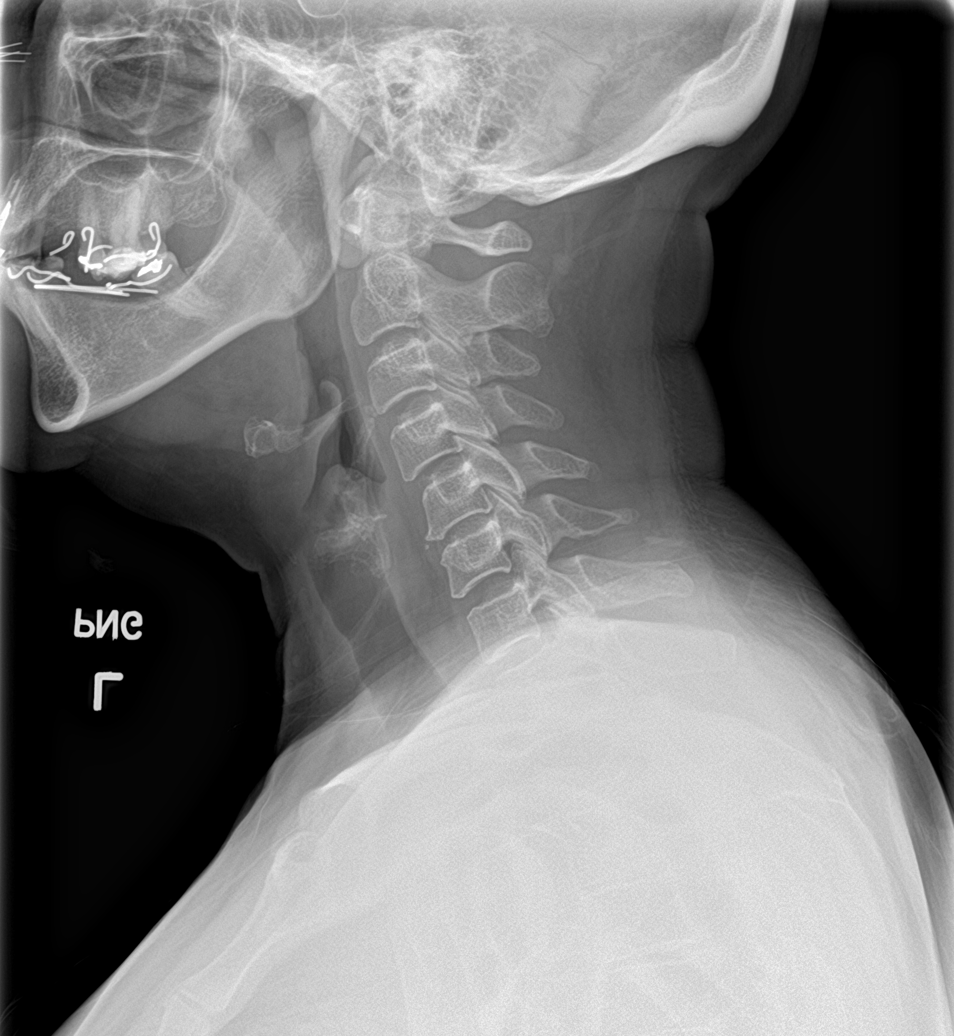

[c-spine obl (1 of 2)]
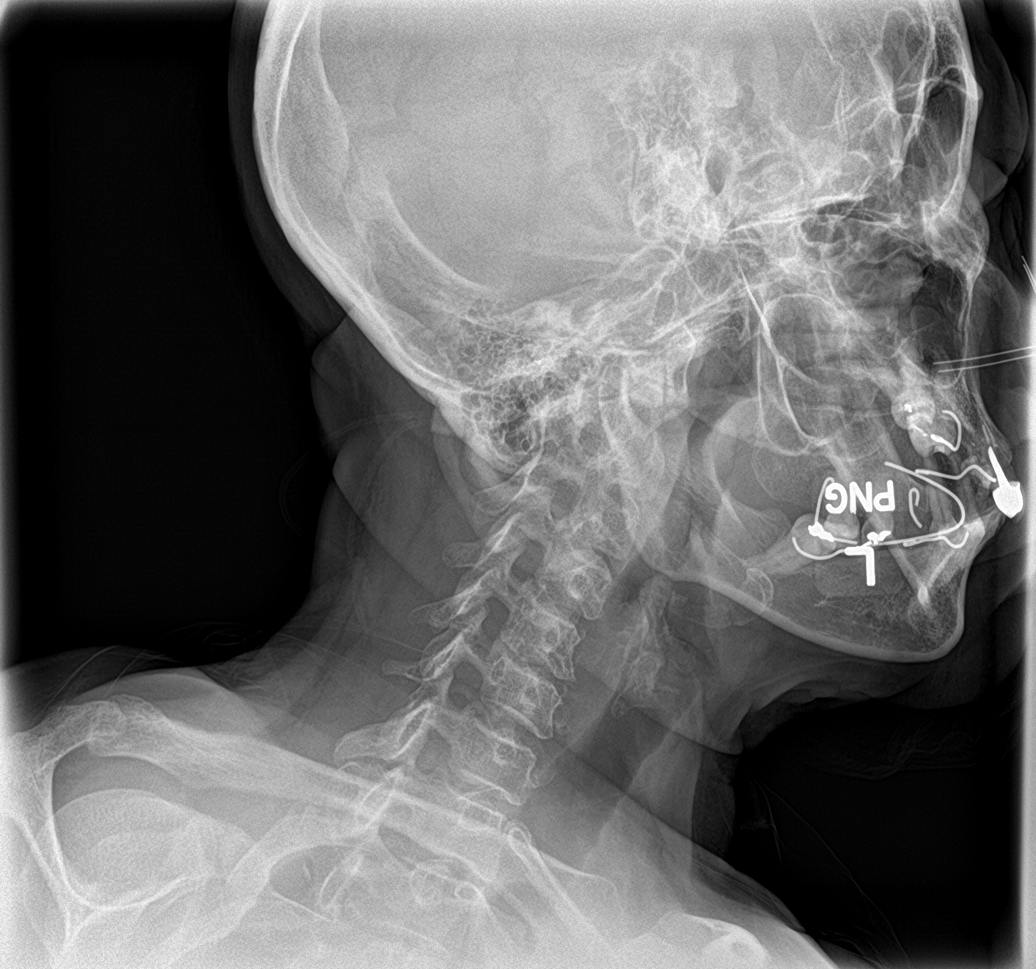

[c-spine obl (2 of 2)]
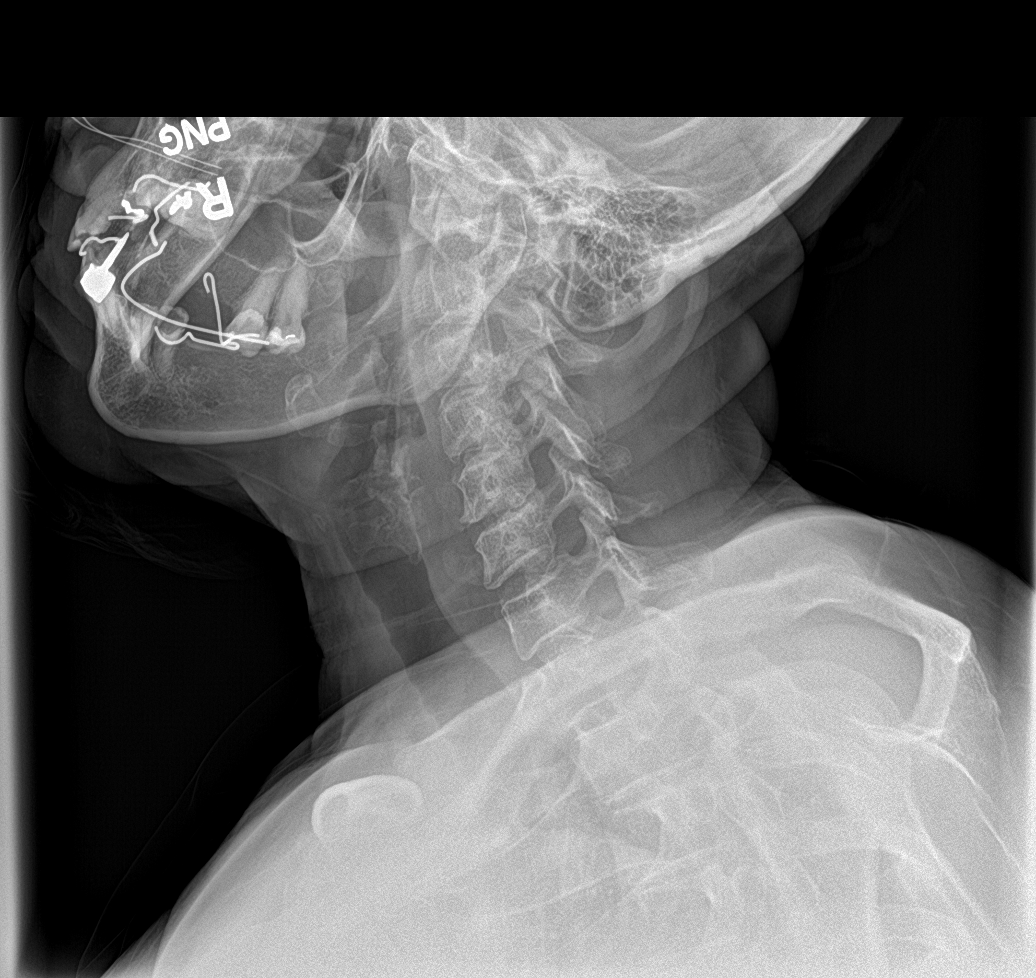

[c-spine ap]
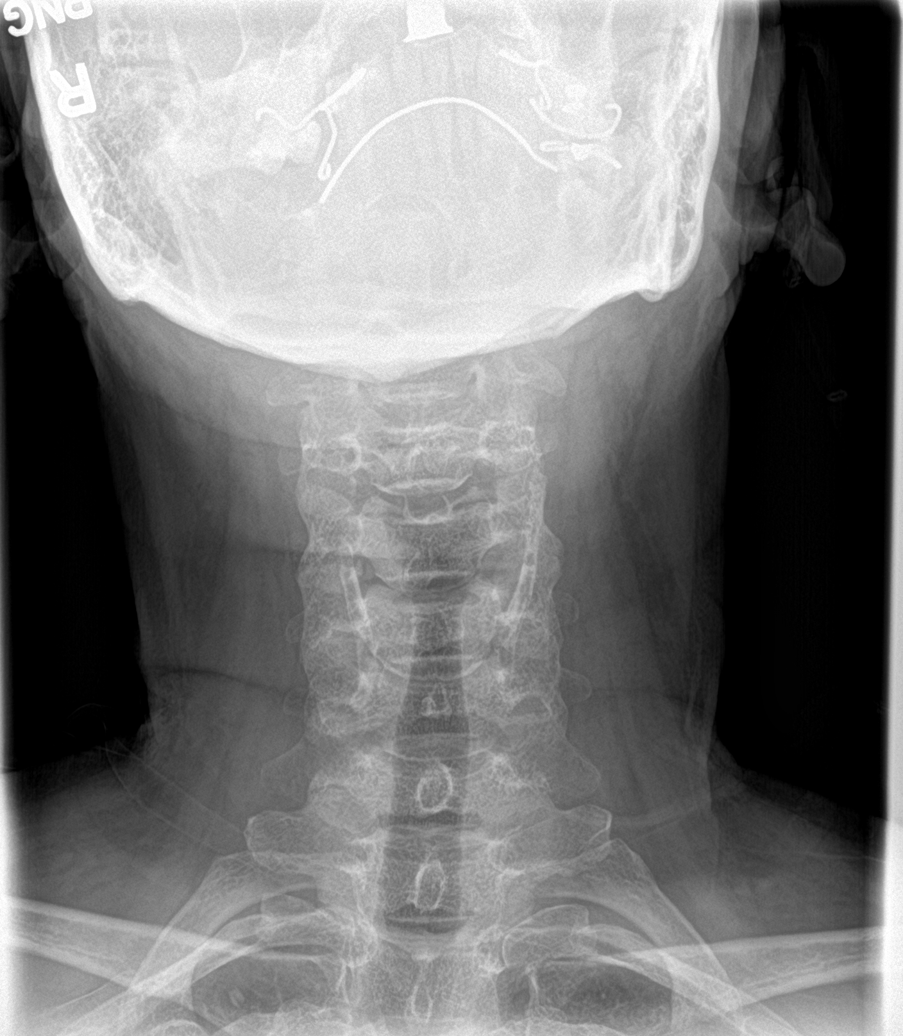

[c-spine open mouth]
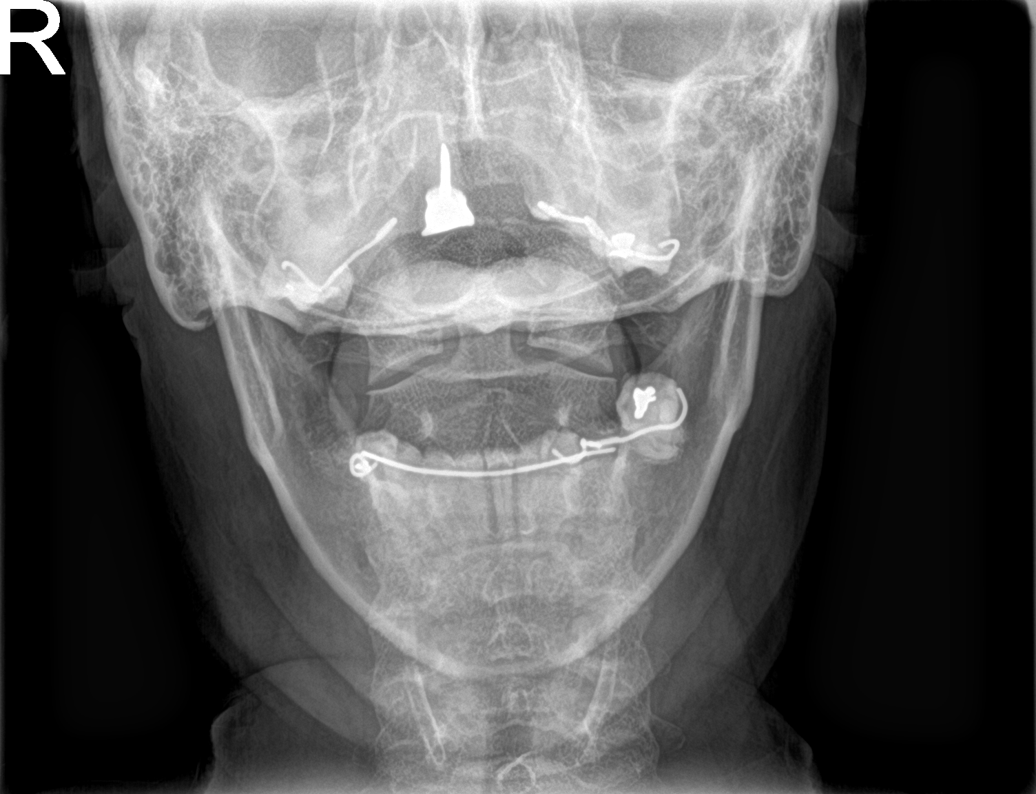

[5 of 5 positions shown; findings below may reference images not displayed]

FINDINGS: Vertebral body alignment and heights are normal. There is minimal
spondylosis over the mid to lower cervical spine. Minimal disc space
narrowing at the C5-6 level. Prevertebral soft tissues are normal.
No significant neural foraminal narrowing. Atlantoaxial articulation
is normal. No acute fracture or subluxation.
IMPRESSION: No acute findings.

Minimal spondylosis of the mid to lower cervical spine with minimal
disc disease at the C5-6 level.

## 2021-08-10 ENCOUNTER — Other Ambulatory Visit (HOSPITAL_COMMUNITY): Payer: Self-pay

## 2021-08-12 ENCOUNTER — Other Ambulatory Visit (HOSPITAL_COMMUNITY): Payer: Self-pay

## 2021-08-24 NOTE — Progress Notes (Deleted)
?Charlann Boxer D.O. ?[facility] ?[place] ?Phone: [phone] ?Subjective:   ? ?I'm seeing this patient by the request  of:  Biagio Borg, MD ? ?CC:  ? ?NOM:[id number]  ?Rachel [name] is a 56 y.o. female coming in with complaint of back and neck pain. OMT 07/29/2021. Patient states  ? ?Medications patient has been prescribed: None ? ?Taking: ? ? ?  ? ? ? ? ?Reviewed prior external information including notes and imaging from previsou exam, outside providers and external EMR if available.  ? ?As well as notes that were available from care everywhere and other healthcare systems. ? ?Past medical history, social, surgical and family history all reviewed in electronic medical record.  No pertanent information unless stated regarding to the chief complaint.  ? ?Past Medical History:  ?Diagnosis Date  ? ALLERGIC RHINITIS 10/04/2007  ? Anemia 01/21/2011  ? Anxiety 11/15/2018  ? ASTHMA 08/02/2007  ? INHALERS ONLY IN [place]  ? Asthma 08/02/2007  ? Qualifier: Diagnosis of  By: Wynona Luna   ? Blood transfusion without reported diagnosis   ? with first child   ? Cervical radiculopathy 01/21/2011  ? Cervicogenic headache 04/27/2017  ? COMMON MIGRAINE 05/15/2009  ? Depression 01/09/2010  ? Qualifier: Diagnosis of  By: Jenny Reichmann MD, [facility] Oris   ? DIABETES MELLITUS, TYPE II 08/02/2007  ? Dysphagia, pharyngoesophageal phase 06/12/2014  ? GERD 08/02/2007  ? HYPERLIPIDEMIA 08/02/2007  ? Hyperlipidemia 08/02/2007  ? Qualifier: Diagnosis of  By: Wynona Luna   ? INSOMNIA-SLEEP DISORDER-UNSPEC 01/04/2008  ? INTERMITTENT VERTIGO 05/15/2009  ? LIBIDO, DECREASED 01/09/2010  ? Migraine without aura 05/15/2009  ? Qualifier: Diagnosis of  By: Jenny Reichmann MD, Hunt Oris   ? Nonallopathic lesion of rib cage 12/21/2017  ? Nonallopathic lesion of sacral region 07/29/2015  ? Nonallopathic lesion of thoracic region 07/29/2015  ? Osteoporosis 04/25/2019  ? 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 Rheumatoid factor  positive 02/10/2016  ? Right knee pain 01/31/2019  ? Injected January 31, 2019  ? Type 2 diabetes mellitus with hyperglycemia, with long-term current use of insulin ([place]) 08/02/2007  ? Qualifier: Diagnosis of  By: Wynona Luna   ?  ?Allergies  ?Allergen Reactions  ? Lipitor [Atorvastatin]   ?  REACTION: myalgias  ? Nitroglycerin   ? ? ? ?Review of Systems: ? No headache, visual changes, nausea, vomiting, diarrhea, constipation, dizziness, abdominal pain, skin rash, fevers, chills, night sweats, weight loss, swollen lymph nodes, body aches, joint swelling, chest pain, shortness of breath, mood changes. POSITIVE muscle aches ? ?Objective  ?There were no vitals taken for this visit. ?  ?General: No apparent distress alert and oriented x3 mood and affect normal, dressed appropriately.  ?HEENT: Pupils equal, extraocular movements intact  ?Respiratory: Patient's speak in full sentences and does not appear short of breath  ?Cardiovascular: No lower extremity edema, non tender, no erythema  ?Neuro: Cranial nerves II through XII are intact, neurovascularly intact in all extremities with 2+ DTRs and 2+ pulses.  ?Gait normal with good balance and coordination.  ?MSK:  Non tender with full range of motion and good stability and symmetric strength and tone of shoulders, elbows, wrist, hip, knee and ankles bilaterally.  ?Back - Normal skin, Spine with normal alignment and no deformity.  No tenderness to vertebral process palpation.  Paraspinous muscles are not tender and without spasm.   Range of motion is full at neck and lumbar  sacral regions ? ?Osteopathic findings ? ?C2 flexed rotated and side bent right ?C6 flexed rotated and side bent left ?T3 extended rotated and side bent right inhaled rib ?T9 extended rotated and side bent left ?L2 flexed rotated and side bent right ?Sacrum right on right ? ? ? ? ?  ?Assessment and Plan: ? ? ? ?Nonallopathic problems ? ?Decision today to treat with OMT was based on Physical  Exam ? ?After verbal consent patient was treated with HVLA, ME, FPR techniques in cervical, rib, thoracic, lumbar, and sacral  areas ? ?Patient tolerated the procedure well with improvement in symptoms ? ?Patient given exercises, stretches and lifestyle modifications ? ?See medications in patient instructions if given ? ?Patient will follow up in 4-8 weeks ? ?  ? ? ?The above documentation has been reviewed and is accurate and complete Rachel [name] ? ? ?  ? ? Note: This dictation was prepared with Dragon dictation along with smaller phrase technology. Any transcriptional errors that result from this process are unintentional.    ?  ?  ? ?

## 2021-08-27 ENCOUNTER — Ambulatory Visit: Payer: Medicaid Other | Admitting: Family Medicine

## 2021-09-08 ENCOUNTER — Ambulatory Visit: Payer: Medicaid Other | Admitting: Family Medicine

## 2021-09-14 ENCOUNTER — Ambulatory Visit: Payer: 59 | Admitting: Internal Medicine

## 2021-10-08 NOTE — Progress Notes (Signed)
?Charlann Boxer D.O. ?[facility] ?[place] ?Phone: [phone] ?Subjective:   ?I, Jacqualin Combes, am serving as a scribe for Dr. Hulan Saas. ? ?This visit occurred during the SARS-CoV-2 public health emergency.  Safety protocols were in place, including screening questions prior to the visit, additional usage of staff PPE, and extensive cleaning of exam room while observing appropriate contact time as indicated for disinfecting solutions.  ? ?I'm seeing this patient by the request  of:  Biagio Borg, MD ? ?CC: neck and back pain  ? ?TDV:[id number]  ?Rachel [name] is a 56 y.o. female coming in with complaint of back and neck pain. OMT 07/29/2021. Patient states that she is having burning sensation in entire spine.  ? ?Continued pain in B knees.  ? ?Medications patient has been prescribed: None ? ? ? ?  ? ? ? ? ?Reviewed prior external information including notes and imaging from previsou exam, outside providers and external EMR if available.  ? ?As well as notes that were available from care everywhere and other healthcare systems. ? ?Past medical history, social, surgical and family history all reviewed in electronic medical record.  No pertanent information unless stated regarding to the chief complaint.  ? ?Past Medical History:  ?Diagnosis Date  ? ALLERGIC RHINITIS 10/04/2007  ? Anemia 01/21/2011  ? Anxiety 11/15/2018  ? ASTHMA 08/02/2007  ? INHALERS ONLY IN [place]  ? Asthma 08/02/2007  ? Qualifier: Diagnosis of  By: Wynona Luna   ? Blood transfusion without reported diagnosis   ? with first child   ? Cervical radiculopathy 01/21/2011  ? Cervicogenic headache 04/27/2017  ? COMMON MIGRAINE 05/15/2009  ? Depression 01/09/2010  ? Qualifier: Diagnosis of  By: Jenny Reichmann MD, Hunt Oris   ? DIABETES MELLITUS, TYPE II 08/02/2007  ? Dysphagia, pharyngoesophageal phase 06/12/2014  ? GERD 08/02/2007  ? HYPERLIPIDEMIA 08/02/2007  ? Hyperlipidemia 08/02/2007  ? Qualifier: Diagnosis of  By: Wynona Luna   ? INSOMNIA-SLEEP DISORDER-UNSPEC 01/04/2008  ? INTERMITTENT VERTIGO 05/15/2009  ? LIBIDO, DECREASED 01/09/2010  ? Migraine without aura 05/15/2009  ? Qualifier: Diagnosis of  By: Jenny Reichmann MD, Hunt Oris   ? Nonallopathic lesion of rib cage 12/21/2017  ? Nonallopathic lesion of sacral region 07/29/2015  ? Nonallopathic lesion of thoracic region 07/29/2015  ? Osteoporosis 04/25/2019  ? 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 Rheumatoid factor positive 02/10/2016  ? Right knee pain 01/31/2019  ? Injected January 31, 2019  ? Type 2 diabetes mellitus with hyperglycemia, with long-term current use of insulin ([place]) 08/02/2007  ? Qualifier: Diagnosis of  By: Wynona Luna   ?  ?Allergies  ?Allergen Reactions  ? Lipitor [Atorvastatin]   ?  REACTION: myalgias  ? Nitroglycerin   ? ? ? ?Review of Systems: ? No headache, visual changes, nausea, vomiting, diarrhea, constipation, dizziness, abdominal pain, skin rash, fevers, chills, night sweats, weight loss, swollen lymph nodes, body aches, joint swelling, chest pain, shortness of breath, mood changes. POSITIVE muscle aches ? ?Objective  ?Blood pressure 138/84, pulse (!) 104, height '5\' 1"'$  (1.549 m), weight 138 lb (62.6 kg). ?  ?General: No apparent distress alert and oriented x3 mood and affect normal, dressed appropriately.  ?HEENT: Pupils equal, extraocular movements intact  ?Respiratory: Patient's speak in full sentences and does not appear short of breath  ?Cardiovascular: No lower extremity edema, non tender, no erythema  ?Significant tightness noted, from cervical and lumbar, tightness overall,  full strength of the extremities  ?Knee exams bilaterally do have some arthritic changes noted.  Patient does have trace effusion of the patellofemoral joint bilaterally.  Some mild instability noted with valgus and varus force also noted.  Mild crepitus positive patellar grind test ? ? ?After informed written and verbal consent, patient was seated on exam table. Right knee  was prepped with alcohol swab and utilizing anterolateral approach, patient's right knee space was injected with 48 mg per 3 mL of Monovisc (sodium hyaluronate) in a prefilled syringe was injected easily into the knee through a 22-gauge needle..Patient tolerated the procedure well without immediate complications. ? ?After informed written and verbal consent, patient was seated on exam table. Left knee was prepped with alcohol swab and utilizing anterolateral approach, patient's left knee space was injected with 48 mg per 3 mL of Monovisc (sodium hyaluronate) in a prefilled syringe was injected easily into the knee through a 22-gauge needle..Patient tolerated the procedure well without immediate complications. ? ?Osteopathic findings ? ?C2 flexed rotated and side bent right ?C7 flexed rotated and side bent left ?T3 extended rotated and side bent right inhaled rib ?T8 extended rotated and side bent left ?L2 flexed rotated and side bent right ?Sacrum right on right ? ? ?  ?Assessment and Plan: ? ?Degenerative arthritis of knee, bilateral ?Mild to moderate arthritic changes noted.  Discussed with patient about icing regimen and home exercises.  Patient does have some increase in activity noted.  Discussed icing regimen.  Follow-up again in 6 to 8 weeks otherwise.  Hoping that this does make improvement. ? ?Cervicogenic headache ?Chronic problem with exacerbation.  Patient has been out of her routine with her being in the [place] the last 2 months with her ailing brother.  Discussed icing regimen and home exercises.  Discussed that this could be contributing to some of the exacerbation she is having.  We discussed different medications which she has had as well.  Gabapentin being 1 that could be beneficial as well as the Flexeril.  Follow-up with me again in 6 to 8 weeks ?  ? ?Nonallopathic problems ? ?Decision today to treat with OMT was based on Physical Exam ? ?After verbal consent patient was treated  with HVLA, ME, FPR techniques in cervical, rib, thoracic, lumbar, and sacral  areas ? ?Patient tolerated the procedure well with improvement in symptoms ? ?Patient given exercises, stretches and lifestyle modifications ? ?See medications in patient instructions if given ? ?Patient will follow up in 4-8 weeks ? ?  ? ? ?The above documentation has been reviewed and is accurate and complete Lyndal Pulley, DO ? ? ? ?  ? ? Note: This dictation was prepared with Dragon dictation along with smaller phrase technology. Any transcriptional errors that result from this process are unintentional.    ?  ?  ? ?

## 2021-10-12 ENCOUNTER — Other Ambulatory Visit (HOSPITAL_COMMUNITY): Payer: Self-pay

## 2021-10-12 ENCOUNTER — Encounter: Payer: Self-pay | Admitting: Internal Medicine

## 2021-10-12 ENCOUNTER — Ambulatory Visit (INDEPENDENT_AMBULATORY_CARE_PROVIDER_SITE_OTHER): Payer: 59 | Admitting: Internal Medicine

## 2021-10-12 VITALS — BP 118/72 | HR 83 | Temp 98.2°F | Ht 61.0 in | Wt 141.6 lb

## 2021-10-12 DIAGNOSIS — K219 Gastro-esophageal reflux disease without esophagitis: Secondary | ICD-10-CM | POA: Diagnosis not present

## 2021-10-12 DIAGNOSIS — Z794 Long term (current) use of insulin: Secondary | ICD-10-CM

## 2021-10-12 DIAGNOSIS — E782 Mixed hyperlipidemia: Secondary | ICD-10-CM

## 2021-10-12 DIAGNOSIS — E1165 Type 2 diabetes mellitus with hyperglycemia: Secondary | ICD-10-CM | POA: Diagnosis not present

## 2021-10-12 DIAGNOSIS — E559 Vitamin D deficiency, unspecified: Secondary | ICD-10-CM

## 2021-10-12 DIAGNOSIS — Z0001 Encounter for general adult medical examination with abnormal findings: Secondary | ICD-10-CM | POA: Diagnosis not present

## 2021-10-12 DIAGNOSIS — Z1211 Encounter for screening for malignant neoplasm of colon: Secondary | ICD-10-CM

## 2021-10-12 LAB — URINALYSIS, ROUTINE W REFLEX MICROSCOPIC
Bilirubin Urine: NEGATIVE
Hgb urine dipstick: NEGATIVE
Ketones, ur: NEGATIVE
Nitrite: NEGATIVE
Specific Gravity, Urine: 1.01 (ref 1.000–1.030)
Total Protein, Urine: NEGATIVE
Urine Glucose: 500 — AB
Urobilinogen, UA: 0.2 (ref 0.0–1.0)
pH: 6.5 (ref 5.0–8.0)

## 2021-10-12 LAB — MICROALBUMIN / CREATININE URINE RATIO
Creatinine,U: 57 mg/dL
Microalb Creat Ratio: 1.2 mg/g (ref 0.0–30.0)
Microalb, Ur: <0.7 mg/dL (ref 0.0–1.9)

## 2021-10-12 LAB — BASIC METABOLIC PANEL
BUN: 9 mg/dL (ref 6–23)
CO2: 27 mEq/L (ref 19–32)
Calcium: 9.1 mg/dL (ref 8.4–10.5)
Chloride: 101 mEq/L (ref 96–112)
Creatinine, Ser: 0.68 mg/dL (ref 0.40–1.20)
GFR: 96.7 mL/min (ref >60.00)
Glucose, Bld: 175 mg/dL — ABNORMAL HIGH (ref 70–99)
Potassium: 3.7 mEq/L (ref 3.5–5.1)
Sodium: 137 mEq/L (ref 135–145)

## 2021-10-12 LAB — LIPID PANEL
Cholesterol: 241 mg/dL — ABNORMAL HIGH (ref 0–200)
HDL: 58.1 mg/dL (ref >39.00)
LDL Cholesterol: 153 mg/dL — ABNORMAL HIGH (ref 0–99)
NonHDL: 182.85
Total CHOL/HDL Ratio: 4
Triglycerides: 148 mg/dL (ref 0.0–149.0)
VLDL: 29.6 mg/dL (ref 0.0–40.0)

## 2021-10-12 LAB — HEPATIC FUNCTION PANEL
ALT: 12 U/L (ref 0–35)
AST: 12 U/L (ref 0–37)
Albumin: 4.4 g/dL (ref 3.5–5.2)
Alkaline Phosphatase: 73 U/L (ref 39–117)
Bilirubin, Direct: 0 mg/dL (ref 0.0–0.3)
Total Bilirubin: 0.2 mg/dL (ref 0.2–1.2)
Total Protein: 7.4 g/dL (ref 6.0–8.3)

## 2021-10-12 LAB — CBC WITH DIFFERENTIAL/PLATELET
Basophils Absolute: 0.1 10*3/uL (ref 0.0–0.1)
Basophils Relative: 0.7 % (ref 0.0–3.0)
Eosinophils Absolute: 0.1 10*3/uL (ref 0.0–0.7)
Eosinophils Relative: 1.6 % (ref 0.0–5.0)
HCT: 35 % — ABNORMAL LOW (ref 36.0–46.0)
Hemoglobin: 11.5 g/dL — ABNORMAL LOW (ref 12.0–15.0)
Lymphocytes Relative: 55.1 % — ABNORMAL HIGH (ref 12.0–46.0)
Lymphs Abs: 4.6 10*3/uL — ABNORMAL HIGH (ref 0.7–4.0)
MCHC: 32.8 g/dL (ref 30.0–36.0)
MCV: 82.5 fl (ref 78.0–100.0)
Monocytes Absolute: 0.7 10*3/uL (ref 0.1–1.0)
Monocytes Relative: 8.7 % (ref 3.0–12.0)
Neutro Abs: 2.8 10*3/uL (ref 1.4–7.7)
Neutrophils Relative %: 33.9 % — ABNORMAL LOW (ref 43.0–77.0)
Platelets: 380 10*3/uL (ref 150.0–400.0)
RBC: 4.24 Mil/uL (ref 3.87–5.11)
RDW: 14.7 % (ref 11.5–15.5)
WBC: 8.4 10*3/uL (ref 4.0–10.5)

## 2021-10-12 LAB — TSH: TSH: 1.43 u[IU]/mL (ref 0.35–5.50)

## 2021-10-12 LAB — VITAMIN D 25 HYDROXY (VIT D DEFICIENCY, FRACTURES): VITD: 36.14 ng/mL (ref 30.00–100.00)

## 2021-10-12 MED ORDER — DEXLANSOPRAZOLE 60 MG PO CPDR
60.0000 mg | DELAYED_RELEASE_CAPSULE | Freq: Every day | ORAL | 3 refills | Status: DC
  Filled 2021-10-12 – 2021-11-03 (×3): qty 90, 90d supply, fill #0
  Filled 2022-02-12 – 2022-02-18 (×3): qty 90, 90d supply, fill #1
  Filled 2022-03-01 (×2): qty 30, 30d supply, fill #1

## 2021-10-12 MED ORDER — ROSUVASTATIN CALCIUM 20 MG PO TABS
20.0000 mg | ORAL_TABLET | Freq: Every day | ORAL | 3 refills | Status: DC
Start: 2021-10-12 — End: 2021-11-13
  Filled 2021-10-12: qty 90, 90d supply, fill #0

## 2021-10-12 NOTE — Assessment & Plan Note (Addendum)
Lab Results  ?Component Value Date  ? LDLCALC 151 (H) 08/28/2020  ? ?Severe uncontrolled, to start crestor 20 ? ?

## 2021-10-12 NOTE — Patient Instructions (Signed)
Please take all new medication as prescribed - the crestor for cholesterol, and dexilant for acid reflux ? ?Please continue all other medications as before, and refills have been done if requested. ? ?Please have the pharmacy call with any other refills you may need. ? ?Please continue your efforts at being more active, low cholesterol diet, and weight control. ? ?You are otherwise up to date with prevention measures today. ? ?Please keep your appointments with your specialists as you may have planned ? ?Please go to the LAB at the blood drawing area for the tests to be done ? ?You will be contacted by phone if any changes need to be made immediately.  Otherwise, you will receive a letter about your results with an explanation, but please check with MyChart first. ? ?Please remember to sign up for MyChart if you have not done so, as this will be important to you in the future with finding out test results, communicating by private email, and scheduling acute appointments online when needed. ? ?Please make an Appointment to return in 6 months, or sooner if needed, also with Lab Appointment for testing done 3-5 days before at the [facility] (so this is for TWO appointments - please see the scheduling desk as you leave) ? ?Due to the ongoing Covid 19 pandemic, our lab now requires an appointment for any labs done at our office.  If you need labs done and do not have an appointment, please call our office ahead of time to schedule before presenting to the lab for your testing. ? ? ?

## 2021-10-12 NOTE — Progress Notes (Signed)
Patient ID: Rachel [name], female   DOB: [birth year], 56 y.o.   MRN: [id number] ? ? ? ?     Chief Complaint:: wellness exam and hld, gerd, dm ? ?     HPI:  Rachel [name] is a 56 y.o. female here for wellness exam; could not tolerate the prep for colonoscopy, ok for cologuard; pt to f/u with GYN for pap, mammogram, declines shingrix o/w up to date ?         ?              Also now has SSI disability for neck pain, back pain,knee pain and depressoin starting last month.    Now seeing pschiatry.  Last seen per endo feb 2023 with foot exam.  Has worsening reflux not controlled with daily PPI prilosec 40,but no abd pain, dysphagia, n/v, bowel change or blood.  Was doing better with last yr but cant recall the name. Pt denies chest pain, increased sob or doe, wheezing, orthopnea, PND, increased LE swelling, palpitations, dizziness or syncope.   Pt denies polydipsia, polyuria, or new focal neuro s/s.    Pt denies fever, wt loss, night sweats, loss of appetite, or other constitutional symptoms  Has had mild worsening reflux, but no other abd pain, dysphagia, n/v, bowel change or blood.Trying to follow lower chol diet ?  ?Wt Readings from Last 3 Encounters:  ?10/12/21 141 lb 9.6 oz (64.2 kg)  ?08/04/21 143 lb (64.9 kg)  ?07/29/21 143 lb (64.9 kg)  ? ?BP Readings from Last 3 Encounters:  ?10/12/21 118/72  ?08/04/21 130/80  ?07/29/21 122/84  ? ?Immunization History  ?Administered Date(s) Administered  ? H1N1 04/15/2008  ? Influenza Whole 04/22/2008, 03/14/2010  ? Influenza,inj,Quad PF,6+ Mos 03/13/2014, 02/28/2019, 02/26/2020  ? Influenza-Unspecified 03/09/2017, 02/14/2018  ? Moderna Sars-Covid-2 Vaccination 04/25/2020, 06/03/2020  ? Pneumococcal Conjugate-13 07/09/2015  ? Pneumococcal Polysaccharide-23 07/08/2008, 06/25/2016  ? Td 10/03/2006  ? Tdap 06/25/2016  ? ?There are no preventive care reminders to display for this patient. ? ?  ? ?Past Medical History:  ?Diagnosis Date  ? ALLERGIC RHINITIS 10/04/2007  ? Anemia  01/21/2011  ? Anxiety 11/15/2018  ? ASTHMA 08/02/2007  ? INHALERS ONLY IN [place]  ? Asthma 08/02/2007  ? Qualifier: Diagnosis of  By: Wynona Luna   ? Blood transfusion without reported diagnosis   ? with first child   ? Cervical radiculopathy 01/21/2011  ? Cervicogenic headache 04/27/2017  ? COMMON MIGRAINE 05/15/2009  ? Depression 01/09/2010  ? Qualifier: Diagnosis of  By: Jenny Reichmann MD, Hunt Oris   ? DIABETES MELLITUS, TYPE II 08/02/2007  ? Dysphagia, pharyngoesophageal phase 06/12/2014  ? GERD 08/02/2007  ? HYPERLIPIDEMIA 08/02/2007  ? Hyperlipidemia 08/02/2007  ? Qualifier: Diagnosis of  By: Wynona Luna   ? INSOMNIA-SLEEP DISORDER-UNSPEC 01/04/2008  ? INTERMITTENT VERTIGO 05/15/2009  ? LIBIDO, DECREASED 01/09/2010  ? Migraine without aura 05/15/2009  ? Qualifier: Diagnosis of  By: Jenny Reichmann MD, Hunt Oris   ? Nonallopathic lesion of rib cage 12/21/2017  ? Nonallopathic lesion of sacral region 07/29/2015  ? Nonallopathic lesion of thoracic region 07/29/2015  ? Osteoporosis 04/25/2019  ? 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 Rheumatoid factor positive 02/10/2016  ? Right knee pain 01/31/2019  ? Injected January 31, 2019  ? Type 2 diabetes mellitus with hyperglycemia, with long-term current use of insulin ([place]) 08/02/2007  ? Qualifier: Diagnosis of  By: Wynona Luna   ? ?Past Surgical History:  ?Procedure  Laterality Date  ? CESAREAN SECTION    ? x 3  ? CYST EXCISION    ? left knee  ? PAROTIDECTOMY  10/21/2011  ? Procedure: PAROTIDECTOMY;  Surgeon: Melida Quitter, MD;  Location: [facility];  Service: ENT;  Laterality: Left;  ? ? reports that she has never smoked. She has never used smokeless tobacco. She reports that she does not drink alcohol and does not use drugs. ?family history includes Asthma in her father; Cervical cancer in her maternal aunt; Diabetes in her father; Heart disease in her maternal aunt; Hypertension in her father. ?Allergies  ?Allergen Reactions  ? Lipitor [Atorvastatin]   ?  REACTION: myalgias  ? Nitroglycerin    ? ?Current Outpatient Medications on File Prior to Visit  ?Medication Sig Dispense Refill  ? aspirin 81 MG EC tablet Take 81 mg by mouth daily.    ? Blood Glucose Monitoring Suppl (BLOOD GLUCOSE MONITOR SYSTEM) w/Device KIT Use as instructed to check blood sugar 2 times daily 1 kit 0  ? Botulinum Toxin Type A (BOTOX) 200 units SOLR Inject 155 units IM Into multiple sites in the face,neck,and head every 90 days 1 each 4  ? butalbital-acetaminophen-caffeine (FIORICET) 50-325-40 MG tablet Take 1 tablet by mouth every 6 (six) hours as needed for headache. Max 2 tablets per day 30 tablet 3  ? Cholecalciferol (VITAMIN D) 50 MCG (2000 UT) CAPS Take 4,000 Units by mouth daily.    ? cyclobenzaprine (FLEXERIL) 5 MG tablet Take 1 tablet (5 mg total) by mouth 3 (three) times daily as needed. for muscle spams 90 tablet 1  ? Diclofenac Sodium (PENNSAID) 2 % SOLN Place 2 g onto the skin 2 (two) times daily. 112 g 3  ? DULoxetine (CYMBALTA) 60 MG capsule Take 1 capsule (60 mg total) by mouth daily. 90 capsule 3  ? eletriptan (RELPAX) 40 MG tablet Take 1 tablet (40 mg total) by mouth as needed for migraine or headache (May repeat after 2 hours.  Maximum 2 tablets in 24 hours). May repeat in 2 hours if headache persists or recurs. 10 tablet 5  ? gabapentin (NEURONTIN) 300 MG capsule TAKE 2 CAPSULES BY MOUTH 2 TIMES DAILY 120 capsule 5  ? glucose blood (CONTOUR NEXT TEST) test strip Use as instructed to check blood sugar 2 times daily 200 each 3  ? insulin glargine (LANTUS) 100 UNIT/ML Solostar Pen Inject 28 Units into the skin daily. 30 mL 1  ? linaclotide (LINZESS) 145 MCG CAPS capsule Take 1 capsule (145 mcg total) by mouth daily before breakfast 30 capsule 2  ? metoCLOPramide (REGLAN) 5 MG tablet Take 1 tablet (5 mg total) by mouth 3 (three) times daily 30 minutes before meals 90 tablet 1  ? Multiple Vitamin (MULTIVITAMIN WITH MINERALS) TABS tablet Take 1 tablet by mouth daily.    ? ondansetron (ZOFRAN ODT) 8 MG disintegrating  tablet Take 1 tablet (8 mg total) by mouth every 8 (eight) hours as needed for nausea or vomiting. 30 tablet 0  ? ondansetron (ZOFRAN) 4 MG tablet Take 1 tablet (4 mg total) by mouth every 8 (eight) hours as needed for nausea & vomitng 40 tablet 1  ? propranolol (INDERAL) 20 MG tablet Take 1.5 tablets (30 mg total) by mouth 2 (two) times daily. 90 tablet 5  ? triamcinolone (NASACORT) 55 MCG/ACT AERO nasal inhaler Place 2 sprays into the nose daily. 1 Inhaler 12  ? albuterol (VENTOLIN HFA) 108 (90 Base) MCG/ACT inhaler INHALE 2 PUFFS INTO THE  LUNGS EVERY 6 (SIX) HOURS AS NEEDED FOR WHEEZING OR SHORTNESS OF BREATH. 18 g 5  ? ?No current facility-administered medications on file prior to visit.  ? ?     ROS:  All others reviewed and negative. ? ?Objective  ? ?     PE:  BP 118/72 (BP Location: Right Arm, Patient Position: Sitting, Cuff Size: Large)   Pulse 83   Temp 98.2 ?F (36.8 ?C) (Oral)   Ht $R'5\' 1"'eO$  (1.549 m)   Wt 141 lb 9.6 oz (64.2 kg)   SpO2 99%   BMI 26.76 kg/m?  ? ?              Constitutional: Pt appears in NAD ?              HENT: Head: NCAT.  ?              Right Ear: External ear normal.   ?              Left Ear: External ear normal.  ?              Eyes: . Pupils are equal, round, and reactive to light. Conjunctivae and EOM are normal ?              Nose: without d/c or deformity ?              Neck: Neck supple. Gross normal ROM ?              Cardiovascular: Normal rate and regular rhythm.   ?              Pulmonary/Chest: Effort normal and breath sounds without rales or wheezing.  ?              Abd:  Soft, NT, ND, + BS, no organomegaly ?              Neurological: Pt is alert. At baseline orientation, motor grossly intact ?              Skin: Skin is warm. No rashes, no other new lesions, LE edema - none ?              Psychiatric: Pt behavior is normal without agitation  ? ?Micro: none ? ?Cardiac tracings I have personally interpreted today:  none ? ?Pertinent Radiological findings (summarize):  none  ? ?Lab Results  ?Component Value Date  ? WBC 8.4 10/12/2021  ? HGB 11.5 (L) 10/12/2021  ? HCT 35.0 (L) 10/12/2021  ? PLT 380.0 10/12/2021  ? GLUCOSE 175 (H) 10/12/2021  ? CHOL 241 (H) 10/12/2021  ? TRIG 14

## 2021-10-14 ENCOUNTER — Other Ambulatory Visit (HOSPITAL_COMMUNITY): Payer: Self-pay

## 2021-10-14 MED ORDER — OZEMPIC (0.25 OR 0.5 MG/DOSE) 2 MG/3ML ~~LOC~~ SOPN
0.5000 mg | PEN_INJECTOR | SUBCUTANEOUS | 2 refills | Status: DC
  Filled 2021-10-14: qty 9, 84d supply, fill #0
  Filled 2022-01-04: qty 9, 84d supply, fill #1

## 2021-10-15 ENCOUNTER — Encounter: Payer: Self-pay | Admitting: Internal Medicine

## 2021-10-15 NOTE — Assessment & Plan Note (Signed)
Lab Results  ?Component Value Date  ? HGBA1C 6.8 (A) 08/04/2021  ? ?Stable, pt to continue current medical treatment lantus, ozempic ? ?

## 2021-10-15 NOTE — Assessment & Plan Note (Signed)
Age and sex appropriate education and counseling updated with regular exercise and diet ?Referrals for preventative services - for cologuard (declines colonoscopy) ?Immunizations addressed - declines shingrix ?Smoking counseling  - none needed ?Evidence for depression or other mood disorder - none significant ?Most recent labs reviewed. ?I have personally reviewed and have noted: ?1) the patient's medical and social history ?2) The patient's current medications and supplements ?3) The patient's height, weight, and BMI have been recorded in the chart ? ?

## 2021-10-15 NOTE — Assessment & Plan Note (Signed)
Uncontrolled symptoms, to start dexilant 60 qd, [facility] referral ?

## 2021-10-19 ENCOUNTER — Ambulatory Visit (INDEPENDENT_AMBULATORY_CARE_PROVIDER_SITE_OTHER): Payer: 59 | Admitting: Family Medicine

## 2021-10-19 ENCOUNTER — Other Ambulatory Visit (HOSPITAL_COMMUNITY): Payer: Self-pay

## 2021-10-19 VITALS — BP 138/84 | HR 104 | Ht 61.0 in | Wt 138.0 lb

## 2021-10-19 DIAGNOSIS — M9904 Segmental and somatic dysfunction of sacral region: Secondary | ICD-10-CM | POA: Diagnosis not present

## 2021-10-19 DIAGNOSIS — M9902 Segmental and somatic dysfunction of thoracic region: Secondary | ICD-10-CM

## 2021-10-19 DIAGNOSIS — M9908 Segmental and somatic dysfunction of rib cage: Secondary | ICD-10-CM

## 2021-10-19 DIAGNOSIS — M170 Bilateral primary osteoarthritis of knee: Secondary | ICD-10-CM | POA: Insufficient documentation

## 2021-10-19 DIAGNOSIS — G4486 Cervicogenic headache: Secondary | ICD-10-CM

## 2021-10-19 DIAGNOSIS — M9901 Segmental and somatic dysfunction of cervical region: Secondary | ICD-10-CM | POA: Diagnosis not present

## 2021-10-19 DIAGNOSIS — M9903 Segmental and somatic dysfunction of lumbar region: Secondary | ICD-10-CM

## 2021-10-19 NOTE — Assessment & Plan Note (Signed)
Chronic problem with exacerbation.  Patient has been out of her routine with her being in the [place] for the last 2 months with her ailing brother.  Discussed icing regimen and home exercises.  Discussed that this could be contributing to some of the exacerbation she is having.  We discussed different medications which she has had as well.  Gabapentin being 1 that could be beneficial as well as the Flexeril.  Follow-up with me again in 6 to 8 weeks ?

## 2021-10-19 NOTE — Patient Instructions (Addendum)
Gel injection today for knees ?Sorry for your loss ?See me again in again in 6 weeks ?

## 2021-10-19 NOTE — Assessment & Plan Note (Signed)
Mild to moderate arthritic changes noted.  Discussed with patient about icing regimen and home exercises.  Patient does have some increase in activity noted.  Discussed icing regimen.  Follow-up again in 6 to 8 weeks otherwise.  Hoping that this does make improvement. ?

## 2021-10-28 ENCOUNTER — Other Ambulatory Visit (HOSPITAL_COMMUNITY): Payer: Self-pay

## 2021-10-28 ENCOUNTER — Telehealth: Payer: Self-pay | Admitting: Neurology

## 2021-10-28 ENCOUNTER — Other Ambulatory Visit: Payer: Self-pay | Admitting: Internal Medicine

## 2021-10-28 DIAGNOSIS — Z1231 Encounter for screening mammogram for malignant neoplasm of breast: Secondary | ICD-10-CM

## 2021-10-28 NOTE — Telephone Encounter (Signed)
Pt came in and brought her Medicaid [employer] card. She says she was told to bring it in so she can get her migraine medication ?

## 2021-10-29 ENCOUNTER — Ambulatory Visit: Payer: 59

## 2021-10-30 ENCOUNTER — Ambulatory Visit
Admission: RE | Admit: 2021-10-30 | Discharge: 2021-10-30 | Disposition: A | Discharge status: Home / Self Care | Payer: Medicaid Other | Source: Ambulatory Visit | Attending: Internal Medicine | Admitting: Internal Medicine

## 2021-10-30 DIAGNOSIS — Z1231 Encounter for screening mammogram for malignant neoplasm of breast: Secondary | ICD-10-CM | POA: Diagnosis not present

## 2021-11-03 ENCOUNTER — Other Ambulatory Visit (HOSPITAL_COMMUNITY): Payer: Self-pay

## 2021-11-06 ENCOUNTER — Encounter: Payer: Self-pay | Admitting: Internal Medicine

## 2021-11-06 ENCOUNTER — Ambulatory Visit (INDEPENDENT_AMBULATORY_CARE_PROVIDER_SITE_OTHER): Payer: Medicaid Other | Admitting: Internal Medicine

## 2021-11-06 VITALS — BP 118/80 | HR 97 | Ht 61.0 in | Wt 141.6 lb

## 2021-11-06 DIAGNOSIS — E782 Mixed hyperlipidemia: Secondary | ICD-10-CM

## 2021-11-06 DIAGNOSIS — E1165 Type 2 diabetes mellitus with hyperglycemia: Secondary | ICD-10-CM

## 2021-11-06 DIAGNOSIS — E663 Overweight: Secondary | ICD-10-CM

## 2021-11-06 DIAGNOSIS — Z794 Long term (current) use of insulin: Secondary | ICD-10-CM

## 2021-11-06 DIAGNOSIS — G63 Polyneuropathy in diseases classified elsewhere: Secondary | ICD-10-CM | POA: Diagnosis not present

## 2021-11-06 LAB — POCT GLYCOSYLATED HEMOGLOBIN (HGB A1C): Hemoglobin A1C: 7.2 % — AB (ref 4.0–5.6)

## 2021-11-06 NOTE — Patient Instructions (Addendum)
Please decrease: - Lantus 24 units in a.m.  Continue: - Ozempic 0.5 mg weekly in a.m.  Try using: - Alpha Lipoic Acid 600 mg 2x day  Please return in 4 months with your sugar log.

## 2021-11-06 NOTE — Progress Notes (Signed)
Subjective:     Patient ID: Rachel [name], female   DOB: [birth year], 56 y.o.   MRN: [id number]  This visit occurred during the SARS-CoV-2 public health emergency.  Safety protocols were in place, including screening questions prior to the visit, additional usage of staff PPE, and extensive cleaning of exam room while observing appropriate contact time as indicated for disinfecting solutions.   HPI Ms. Rachel [name] is a  56 y.o. woman, returning for f/u for DM2, dx 1989, uncontrolled, insulin-dependent, with probable complications (? peripheral neuropathy, ?  Gastroparesis).  Last visit 3 months ago.  Interim history: No increased urination, blurry vision, chest pain. Constipation improved on Linzess.   She describes tingling and burning on her back and lateral side of her chest.  Reviewed HbA1c levels: Lab Results  Component Value Date   HGBA1C 6.8 (A) 08/04/2021   HGBA1C 6.7 (A) 03/30/2021   HGBA1C 8.3 (H) 12/30/2020  02/17/2017: HbA1c calculated from fructosamine is better, at 8.34%, but still high. Prev. 10.1%.  Reviewed history: She came off all DM medicines for 6 mo before the HbA1c in 09/2013. She again ran out of all meds in 04/2016.  She is  on: - Lantus 40 >> 34 >> 36 >> 32 >> 28 units in a.m. - Ozempic 0.5 mg weekly - added 06/2019 >> 0.25 >> 0.5 mg weekly  Stopped Januvia 100 mg in am >> then Ozempic 0.5 mg weekly She is off Metformin XR 1000 mg 2x a day with b'fast and dinner >> upset stomach >> stopped 33/8250 We triedTrulicity 1.5 mg weekly >> worked great but had to stop b/c GERD >> stopped. We tried Jardiance 25 mg daily >> started 08/2016 >> but stopped recently 2/2 recurrent UTIs She was on Glipizide 5 mg bid - added 04/2015 >> was not taking it b/c low CBGs. We stopped Glipizide XL 5 mg in 07/2014. She had episodes of hypoglycemia with Amaryl in the past. She was on Bydureon 2 mg weekly (had nausea). Previously on Humalog, then NovoLog -started 12/2020  She checks  her sugars 1-2 times a day: - am:  88-126, 137, 200 >> 96-138, 160 (steroid inj) >> 103-126, 154 - after b'fast: 158-251 >> n/c  - before lunch: 180-200 >> n/c >> 60 x1 >> n/c >> 75 (fasting, w/o insulin) - after lunch: up to 200s >> n/c - before dinner:  180-200 >> 137, 150 >> 140-150  >> n/c - after dinner: 166-245 >> n/c - bedtime: n/c >> 300 >> 100-125 (has snack) >> n/c >> 130s She has hypoglycemia awareness in the 90s. Lowest: 38 (while on Trulicity) >> ... 49 (05/2020) at night - lost consciousness x2 >> 88 >> 96 >> 75. Highest 600 >> .Marland Kitchen.  150 >> HI >> 210 (Chinese food) >> 160 >> 154 (Mother's day).  She saw nutrition in the past.  -+ HL. Last lipids: Lab Results  Component Value Date   CHOL 241 (H) 10/12/2021   HDL 58.10 10/12/2021   LDLCALC 153 (H) 10/12/2021   LDLDIRECT 191.0 08/23/2018   TRIG 148.0 10/12/2021   CHOLHDL 4 10/12/2021  On Crestor 40 - restarted after  the above labs returned.  -No CKD: Lab Results  Component Value Date   BUN 9 10/12/2021   Lab Results  Component Value Date   CREATININE 0.68 10/12/2021   -+ Numbness and tingling in the right leg.    - Latest eye appointment was in 06/2021: No DR   She had a gastric emptying  study (02/03/2021) showed gastroparesis (but checked on Ozempic!). Started on Reglan, but not using it consistently. 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  Review of Systems + see HPI  I reviewed pt's medications, allergies, PMH, social hx, family hx, and changes were documented in the history of present illness. Otherwise, unchanged from my initial visit note.  Past Medical History:  Diagnosis Date   ALLERGIC RHINITIS 10/04/2007   Anemia 01/21/2011   Anxiety 11/15/2018   ASTHMA 08/02/2007   INHALERS ONLY IN [place]   Asthma 08/02/2007   Qualifier: Diagnosis of  By: Wynona Luna    Blood transfusion without reported diagnosis    with first child    Cervical radiculopathy 01/21/2011   Cervicogenic headache 04/27/2017    COMMON MIGRAINE 05/15/2009   Depression 01/09/2010   Qualifier: Diagnosis of  By: Jenny Reichmann MD, [place], TYPE II 08/02/2007   Dysphagia, pharyngoesophageal phase 06/12/2014   GERD 08/02/2007   HYPERLIPIDEMIA 08/02/2007   Hyperlipidemia 08/02/2007   Qualifier: Diagnosis of  By: Wynona Luna    INSOMNIA-SLEEP DISORDER-UNSPEC 01/04/2008   INTERMITTENT VERTIGO 05/15/2009   LIBIDO, DECREASED 01/09/2010   Migraine without aura 05/15/2009   Qualifier: Diagnosis of  By: Jenny Reichmann MD, [place]    Nonallopathic lesion of rib cage 12/21/2017   Nonallopathic lesion of sacral region 07/29/2015   Nonallopathic lesion of thoracic region 07/29/2015   Osteoporosis 04/25/2019   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   Rheumatoid factor positive 02/10/2016   Right knee pain 01/31/2019   Injected January 31, 2019   Type 2 diabetes mellitus with hyperglycemia, with long-term current use of insulin ([place]) 08/02/2007   Qualifier: Diagnosis of  By: Wynona Luna    Past Surgical History:  Procedure Laterality Date   CESAREAN SECTION     x 3   CYST EXCISION     left knee   PAROTIDECTOMY  10/21/2011   Procedure: PAROTIDECTOMY;  Surgeon: Melida Quitter, MD;  Location: [facility] OR;  Service: ENT;  Laterality: Left;   Social History   Socioeconomic History   Marital status: Married    Spouse name: Rachel [name]   Number of children: 3   Years of education: Degree   Highest education level: Not on file  Occupational History   Occupation: Chartered certified accountant    Employer: [employer]   Occupation: [place]    Employer: [employer]  Tobacco Use   Smoking status: Never   Smokeless tobacco: Never  Vaping Use   Vaping Use: Never used  Substance and Sexual Activity   Alcohol use: No    Alcohol/week: 0.0 standard drinks   Drug use: No   Sexual activity: Not on file  Other Topics Concern   Not on file  Social History Narrative   She has 3 daughters.   Patient has a 4 year degree.    Patient  working at [employer].    Patient is married to [place].   Social Determinants of Health   Financial Resource Strain: Not on file  Food Insecurity: Not on file  Transportation Needs: Not on file  Physical Activity: Not on file  Stress: Not on file  Social Connections: Not on file  Intimate Partner Violence: Not on file   Current Outpatient Medications on File Prior to Visit  Medication Sig Dispense Refill   albuterol (VENTOLIN HFA) 108 (90 Base) MCG/ACT inhaler INHALE 2 PUFFS INTO THE LUNGS  EVERY 6 (SIX) HOURS AS NEEDED FOR WHEEZING OR SHORTNESS OF BREATH. 18 g 5   aspirin 81 MG EC tablet Take 81 mg by mouth daily.     Blood Glucose Monitoring Suppl (BLOOD GLUCOSE MONITOR SYSTEM) w/Device KIT Use as instructed to check blood sugar 2 times daily 1 kit 0   Botulinum Toxin Type A (BOTOX) 200 units SOLR Inject 155 units IM Into multiple sites in the face,neck,and head every 90 days 1 each 4   butalbital-acetaminophen-caffeine (FIORICET) 50-325-40 MG tablet Take 1 tablet by mouth every 6 (six) hours as needed for headache. Max 2 tablets per day 30 tablet 3   Cholecalciferol (VITAMIN D) 50 MCG (2000 UT) CAPS Take 4,000 Units by mouth daily.     cyclobenzaprine (FLEXERIL) 5 MG tablet Take 1 tablet (5 mg total) by mouth 3 (three) times daily as needed. for muscle spams 90 tablet 1   dexlansoprazole (DEXILANT) 60 MG capsule Take 1 capsule (60 mg total) by mouth daily. 90 capsule 3   Diclofenac Sodium (PENNSAID) 2 % SOLN Place 2 g onto the skin 2 (two) times daily. 112 g 3   DULoxetine (CYMBALTA) 60 MG capsule Take 1 capsule (60 mg total) by mouth daily. 90 capsule 3   eletriptan (RELPAX) 40 MG tablet Take 1 tablet (40 mg total) by mouth as needed for migraine or headache (May repeat after 2 hours.  Maximum 2 tablets in 24 hours). May repeat in 2 hours if headache persists or recurs. 10 tablet 5   gabapentin (NEURONTIN) 300 MG capsule TAKE 2 CAPSULES BY MOUTH 2 TIMES DAILY 120 capsule 5   glucose  blood (CONTOUR NEXT TEST) test strip Use as instructed to check blood sugar 2 times daily 200 each 3   insulin glargine (LANTUS) 100 UNIT/ML Solostar Pen Inject 28 Units into the skin daily. 30 mL 1   linaclotide (LINZESS) 145 MCG CAPS capsule Take 1 capsule (145 mcg total) by mouth daily before breakfast 30 capsule 2   metoCLOPramide (REGLAN) 5 MG tablet Take 1 tablet (5 mg total) by mouth 3 (three) times daily 30 minutes before meals 90 tablet 1   Multiple Vitamin (MULTIVITAMIN WITH MINERALS) TABS tablet Take 1 tablet by mouth daily.     ondansetron (ZOFRAN ODT) 8 MG disintegrating tablet Take 1 tablet (8 mg total) by mouth every 8 (eight) hours as needed for nausea or vomiting. 30 tablet 0   ondansetron (ZOFRAN) 4 MG tablet Take 1 tablet (4 mg total) by mouth every 8 (eight) hours as needed for nausea & vomitng 40 tablet 1   propranolol (INDERAL) 20 MG tablet Take 1.5 tablets (30 mg total) by mouth 2 (two) times daily. 90 tablet 5   rosuvastatin (CRESTOR) 20 MG tablet Take 1 tablet (20 mg total) by mouth daily. 90 tablet 3   Semaglutide,0.25 or 0.5MG /DOS, (OZEMPIC, 0.25 OR 0.5 MG/DOSE,) 2 MG/3ML SOPN Inject 0.5 mg into the skin once a week. 9 mL 2   triamcinolone (NASACORT) 55 MCG/ACT AERO nasal inhaler Place 2 sprays into the nose daily. 1 Inhaler 12   No current facility-administered medications on file prior to visit.   Allergies  Allergen Reactions   Lipitor [Atorvastatin]     REACTION: myalgias   Nitroglycerin    Family History  Problem Relation Age of Onset   Hypertension Father    Diabetes Father    Asthma Father    Cervical cancer Maternal Aunt    Heart disease Maternal Aunt  Anesthesia problems Neg Hx    Colon cancer Neg Hx    Breast cancer Neg Hx    Esophageal cancer Neg Hx    Stomach cancer Neg Hx     Objective:   Physical Exam BP 118/80 (BP Location: Right Arm, Patient Position: Sitting, Cuff Size: Normal)   Pulse 97   Ht $R'5\' 1"'Mp$  (1.549 m)   Wt 141 lb 9.6 oz  (64.2 kg)   SpO2 99%   BMI 26.76 kg/m   Wt Readings from Last 3 Encounters:  11/06/21 141 lb 9.6 oz (64.2 kg)  10/19/21 138 lb (62.6 kg)  10/12/21 141 lb 9.6 oz (64.2 kg)   Constitutional:normal weight, in NAD Eyes: PERRLA, EOMI, no exophthalmos ENT: moist mucous membranes, no thyromegaly, no cervical lymphadenopathy Cardiovascular: tachycardia, RR, No MRG Respiratory: CTA B Musculoskeletal: no deformities, strength intact in all 4 Skin: moist, warm, no rashes Neurological: no tremor with outstretched hands, DTR normal in all 4 Diabetic Foot Exam - Simple   Simple Foot Form Diabetic Foot exam was performed with the following findings: Yes 11/06/2021  1:19 PM  Visual Inspection No deformities, no ulcerations, no other skin breakdown bilaterally: Yes Sensation Testing Intact to touch and monofilament testing bilaterally: Yes Pulse Check Posterior Tibialis and Dorsalis pulse intact bilaterally: Yes Comments      Assessment:     1. DM2, uncontrolled, insulin-dependent, with probable complications - ? peripheral neuropathy  2. HL  3.  Overweight  4. PN  Plan:     1. Patient with history of type 2 diabetes, uncontrolled, on basal insulin and weekly GLP-1 receptor agonist, with improved control at the last 2 visits.  At last visit, HbA1c was slightly higher, at 6.8%, but still at goal.  At that time, sugars were mostly at goal but she was still only checking in the morning and I advised her to also check later in the day.  She had higher blood sugars in the mornings after steroid injections but we did not change the regimen at that time. -Of note, she had nausea, vomiting, and bloating initially on Ozempic but these resolved.  She had a gastric emptying study that showed delay gastric emptying, but this was checked while she was on Ozempic.  She does have constipation, for which she takes Linzess.  This improved lately. -At today's visit, sugars appear to be lower than at last  visit, but she is still mostly checking in the morning.  She only occasionally checks at night and his sugars are well into the normal range.  For now, as she occasionally feels that her sugars are too low (when measuring with her glucometer at that time, she is in the 70s), I advised her to decrease the Lantus dose slightly. - I advised her to: Patient Instructions  Please decrease: - Lantus 24 units in a.m.  Continue: - Ozempic 0.5 mg weekly in a.m.  Try using: - Alpha Lipoic Acid 600 mg 2x day  Please return in 4 months with your sugar log.   - we checked her HbA1c: 7.2% (higher) -but not correlating with blood sugars at home.  I have advised her to check sugars later in the day more frequently so that we do not miss periods with high blood sugars - advised to check sugars at different times of the day - 1x a day, rotating check times - advised for yearly eye exams >> she is UTD - return to clinic in 3-4 months  2. HL -Reviewed latest  lipid panel from 10/2021: LDL still very high, the rest the fractions at goal (out of Crestor then): Lab Results  Component Value Date   CHOL 241 (H) 10/12/2021   HDL 58.10 10/12/2021   LDLCALC 153 (H) 10/12/2021   LDLDIRECT 191.0 08/23/2018   TRIG 148.0 10/12/2021   CHOLHDL 4 10/12/2021  -She is back on Crestor 40 mg daily without side effects.    3.  Overweight -We will continue Ozempic which should also help with weight loss -Weight was stable at last 2 visits, now she lost 2 pounds since the previous visit  4. PN -At last visit, vitamin B12 level was normal: Lab Results  Component Value Date   VITAMINB12 353 08/04/2021  -At last visit, I recommended alpha-lipoic acid 600 mg twice a day- did not start as she forgot -At this visit she also describes tingling and burning on her back and lateral chest.  This is not congruent with diabetic neuropathy.  Alpha-lipoic acid may help, but I did advise her to discuss with her neurologist, Dr. Tomi Likens,  but this.  Philemon Kingdom, MD PhD [facility]

## 2021-11-12 NOTE — Progress Notes (Unsigned)
NEUROLOGY FOLLOW UP OFFICE NOTE  Breiana Stratmann [id number]  Assessment/Plan:   Chronic migraine without Aura, Without Status Migrainosus, Not Intractable - tried multiple preventatives such as topiramate, Depakote, nortriptyline, venlafaxine, atenolol, Aimovig, Emgality - meets criteria for Botox.  Will appeal Cervicogenic component to her migraines.  Has radiculopathy and spondylosis      For preventative management,appeal for Botox.  Stop cyclobenzaprine and start tizanidine 4mg  up to 3 times daily as needed (at least take every night at bedtime).  Continue gabapentin 600mg  twice daily.  Discontinue propranolol  For abortive therapy, try eletriptan 40mg ; urged her to  Limit use of pain relievers to no more than 2 days out of week to prevent risk of rebound or medication-overuse headache.  Keep headache diary Has upcoming appointment with Dr. [name] regarding her neck pain Follow up 6 months.   Subjective:  Justin Buechner is a 56 year old right-handed female with type 2 diabetes, hyperlipidemia, GERD, asthma, and migraine who follows up for migraines.   UPDATE: Started propranolol last visit with plan to start Botox.  Her insurance didn't approve it.  We appealed and she got added insurance.  However, we never received a call back from the prior authorization team.  Migraines are severe, occurs 3 days a week.  Toot eletriptan which made her feel "numb" and didn't help.  She reports worsening neck pain radiating into back of head.  Continues to see Dr. [name] of Sports Medicine.     Current NSAIDS: none Current analgesics: none Current triptans: none Current ergotamine: None Current anti-emetic: promethazine, Reglan Current muscle relaxants: none Current anti-anxiolytic: None Current sleep aide: None Current Antihypertensive medications: propranolol 30mg  BID Current Antidepressant medications: Cymbalta 60mg  daily Current Anticonvulsant medications: Gabapentin 600 mg twice daily (for  back pain) Current anti-CGRP: none Current Vitamins/Herbal/Supplements: Turmeric Current Antihistamines/Decongestants: mecllzine, Allegra   Caffeine: One cup of coffee daily Diet: Drinks plenty of water.  No soda. Exercise: No Depression: No; Anxiety: yes.  Her mother has been ill.  She has been confused. Other pain: Knee pain. Sleep hygiene: Poor.  Worrying about her mother.   HISTORY: Onset:  2011.  She does have remote history of headaches when she was younger. Location:  Back of head and radiates to the front Quality:  Shooting up from back of head, pounding on top and front of head Initial Intensity:  8/10 Aura:  no Prodrome:  no Associated symptoms:  Nausea, photophobia, phonophobia, blurred vision (she gets annual eye exams due to diabetes) Initial Duration:  All day Initial Frequency:  Almost daily (cannot function 6 days per month) Triggers/exacerbating factors:  wine Relieving factors:  No Activity:  Cannot work about 6 days per month (she works as an Advertising account planner)   Past NSAIDS: Cambia (made headache worse), flurbiprofen, Sprix nasal spray, ibuprofen; naproxen Past analgesics: Tramadol, Excedrin, Tylenol, Fioricet Past abortive triptans: Sumatriptan 100 mg, Zembrace-SymTouch (did not like the feeling), Zomig 5 mg NS (did not like the way it made her feel), Maxalt (dizziness, ineffective), Relpax (made her feel sick) Past abortive ergotamine: Cafergot Past muscle relaxants: Robaxin, Flexeril Past anti-emetic: Zofran 8mg , Reglan, Phenergan Past antihypertensive medications: Atenolol Past antidepressant medications: Venlafaxine XR 150 mg; nortriptyline Past anticonvulsant medications: Topiramate, Depakote Past anti-CGRP: Aimovig 140mg , Emgality, Nurtec (dizziness) Past vitamins/Herbal/Supplements: None Past antihistamines/decongestants: None Other past therapies: Epidural injections (helps for 2 weeks)   Frequent Falls: For almost 10 years, she has had  falls, but they have become more frequent over the past year.  Previously, she was diagnosed with vertigo.  However, she reports that she doesn't note spinning or lightheadedness.  She says she "just falls".  On one occasion, she was pushing the seat forward while sitting and just fell over.  Otherwise, it always occurs while walking or on her feet.  She denies double vision or focal numbness or weakness.  MRI of the brain with seizure protocol was performed on 08/02/11, which was unremarkable.  Sleep deprived EEG was normal.  She has had PT, which was ineffective.  She does report that she sometimes trips while walking but doesn't fall.   Cervical plain films from 05/07/15 showed mild degenerative disc disease at C5-6, but otherwise unremarkable.  Lumbar films from 06/10/15 were personally reviewed and were negative.  She underwent anothner MRI of brain with and without contrast on 02/10/16, and was stable compared to prior MRI from 2013.  NCV-EMG from 05/13/16 was negative for neuropathy or lumbosacral radiculopathy.  MRI cervical spine on 03/31/2019 showed degenerative spondylosis but no significant spinal stenosis or cord compression. Denies diplopia or visual hallucinations.   PAST MEDICAL HISTORY: Past Medical History:  Diagnosis Date   ALLERGIC RHINITIS 10/04/2007   Anemia 01/21/2011   Anxiety 11/15/2018   ASTHMA 08/02/2007   INHALERS ONLY IN [place]   Asthma 08/02/2007   Qualifier: Diagnosis of  By: Wynona Luna    Blood transfusion without reported diagnosis    with first child    Cervical radiculopathy 01/21/2011   Cervicogenic headache 04/27/2017   COMMON MIGRAINE 05/15/2009   Depression 01/09/2010   Qualifier: Diagnosis of  By: Jenny Reichmann MD, [place], TYPE II 08/02/2007   Dysphagia, pharyngoesophageal phase 06/12/2014   GERD 08/02/2007   HYPERLIPIDEMIA 08/02/2007   Hyperlipidemia 08/02/2007   Qualifier: Diagnosis of  By: Wynona Luna    INSOMNIA-SLEEP DISORDER-UNSPEC  01/04/2008   INTERMITTENT VERTIGO 05/15/2009   LIBIDO, DECREASED 01/09/2010   Migraine without aura 05/15/2009   Qualifier: Diagnosis of  By: Jenny Reichmann MD, Hunt Oris    Nonallopathic lesion of rib cage 12/21/2017   Nonallopathic lesion of sacral region 07/29/2015   Nonallopathic lesion of thoracic region 07/29/2015   Osteoporosis 04/25/2019   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   Rheumatoid factor positive 02/10/2016   Right knee pain 01/31/2019   Injected January 31, 2019   Type 2 diabetes mellitus with hyperglycemia, with long-term current use of insulin ([place]) 08/02/2007   Qualifier: Diagnosis of  By: Wynona Luna     MEDICATIONS: Current Outpatient Medications on File Prior to Visit  Medication Sig Dispense Refill   albuterol (VENTOLIN HFA) 108 (90 Base) MCG/ACT inhaler INHALE 2 PUFFS INTO THE LUNGS EVERY 6 (SIX) HOURS AS NEEDED FOR WHEEZING OR SHORTNESS OF BREATH. 18 g 5   aspirin 81 MG EC tablet Take 81 mg by mouth daily.     Blood Glucose Monitoring Suppl (BLOOD GLUCOSE MONITOR SYSTEM) w/Device KIT Use as instructed to check blood sugar 2 times daily 1 kit 0   Botulinum Toxin Type A (BOTOX) 200 units SOLR Inject 155 units IM Into multiple sites in the face,neck,and head every 90 days 1 each 4   butalbital-acetaminophen-caffeine (FIORICET) 50-325-40 MG tablet Take 1 tablet by mouth every 6 (six) hours as needed for headache. Max 2 tablets per day 30 tablet 3   Cholecalciferol (VITAMIN D) 50 MCG (2000 UT) CAPS Take 4,000 Units by mouth daily.     cyclobenzaprine (FLEXERIL) 5 MG  tablet Take 1 tablet (5 mg total) by mouth 3 (three) times daily as needed. for muscle spams 90 tablet 1   dexlansoprazole (DEXILANT) 60 MG capsule Take 1 capsule (60 mg total) by mouth daily. 90 capsule 3   Diclofenac Sodium (PENNSAID) 2 % SOLN Place 2 g onto the skin 2 (two) times daily. 112 g 3   DULoxetine (CYMBALTA) 60 MG capsule Take 1 capsule (60 mg total) by mouth daily. 90 capsule 3   eletriptan  (RELPAX) 40 MG tablet Take 1 tablet (40 mg total) by mouth as needed for migraine or headache (May repeat after 2 hours.  Maximum 2 tablets in 24 hours). May repeat in 2 hours if headache persists or recurs. 10 tablet 5   gabapentin (NEURONTIN) 300 MG capsule TAKE 2 CAPSULES BY MOUTH 2 TIMES DAILY 120 capsule 5   glucose blood (CONTOUR NEXT TEST) test strip Use as instructed to check blood sugar 2 times daily 200 each 3   insulin glargine (LANTUS) 100 UNIT/ML Solostar Pen Inject 28 Units into the skin daily. 30 mL 1   linaclotide (LINZESS) 145 MCG CAPS capsule Take 1 capsule (145 mcg total) by mouth daily before breakfast 30 capsule 2   metoCLOPramide (REGLAN) 5 MG tablet Take 1 tablet (5 mg total) by mouth 3 (three) times daily 30 minutes before meals 90 tablet 1   Multiple Vitamin (MULTIVITAMIN WITH MINERALS) TABS tablet Take 1 tablet by mouth daily.     ondansetron (ZOFRAN ODT) 8 MG disintegrating tablet Take 1 tablet (8 mg total) by mouth every 8 (eight) hours as needed for nausea or vomiting. 30 tablet 0   ondansetron (ZOFRAN) 4 MG tablet Take 1 tablet (4 mg total) by mouth every 8 (eight) hours as needed for nausea & vomitng 40 tablet 1   propranolol (INDERAL) 20 MG tablet Take 1.5 tablets (30 mg total) by mouth 2 (two) times daily. 90 tablet 5   rosuvastatin (CRESTOR) 20 MG tablet Take 1 tablet (20 mg total) by mouth daily. 90 tablet 3   Semaglutide,0.25 or 0.5MG /DOS, (OZEMPIC, 0.25 OR 0.5 MG/DOSE,) 2 MG/3ML SOPN Inject 0.5 mg into the skin once a week. 9 mL 2   triamcinolone (NASACORT) 55 MCG/ACT AERO nasal inhaler Place 2 sprays into the nose daily. 1 Inhaler 12   No current facility-administered medications on file prior to visit.    ALLERGIES: Allergies  Allergen Reactions   Lipitor [Atorvastatin]     REACTION: myalgias   Nitroglycerin     FAMILY HISTORY: Family History  Problem Relation Age of Onset   Hypertension Father    Diabetes Father    Asthma Father    Cervical  cancer Maternal Aunt    Heart disease Maternal Aunt    Anesthesia problems Neg Hx    Colon cancer Neg Hx    Breast cancer Neg Hx    Esophageal cancer Neg Hx    Stomach cancer Neg Hx       Objective:  Blood pressure 120/87, pulse 97, height 5\' 1"  (1.549 m), weight 141 lb 12.8 oz (64.3 kg), SpO2 94 %. General: No acute distress.  Patient appears well-groomed.   Head:  Normocephalic/atraumatic Eyes:  Fundi examined but not visualized Neck: supple, bilateral paraspinal tenderness, full range of motion Heart:  Regular rate and rhythm Lungs:  Clear to auscultation bilaterally Back: No paraspinal tenderness Neurological Exam: alert and oriented to person, place, and time.  Speech fluent and not dysarthric, language intact.  CN II-XII intact. Bulk  and tone normal, muscle strength 5/5 throughout.  Sensation to light touch intact.  Deep tendon reflexes 2+ throughout, toes downgoing.  Finger to nose testing intact.  Gait normal, Romberg negative.   Metta Clines, DO  CC: Cathlean Cower, MD

## 2021-11-13 ENCOUNTER — Encounter: Payer: Self-pay | Admitting: Neurology

## 2021-11-13 ENCOUNTER — Other Ambulatory Visit (HOSPITAL_COMMUNITY): Payer: Self-pay

## 2021-11-13 ENCOUNTER — Telehealth (HOSPITAL_COMMUNITY): Payer: Self-pay | Admitting: Pharmacy Technician

## 2021-11-13 ENCOUNTER — Ambulatory Visit (INDEPENDENT_AMBULATORY_CARE_PROVIDER_SITE_OTHER): Payer: 59 | Admitting: Neurology

## 2021-11-13 VITALS — BP 120/87 | HR 97 | Ht 61.0 in | Wt 141.8 lb

## 2021-11-13 DIAGNOSIS — M5412 Radiculopathy, cervical region: Secondary | ICD-10-CM | POA: Diagnosis not present

## 2021-11-13 DIAGNOSIS — G43709 Chronic migraine without aura, not intractable, without status migrainosus: Secondary | ICD-10-CM

## 2021-11-13 DIAGNOSIS — G4486 Cervicogenic headache: Secondary | ICD-10-CM

## 2021-11-13 MED ORDER — TIZANIDINE HCL 4 MG PO TABS
4.0000 mg | ORAL_TABLET | Freq: Three times a day (TID) | ORAL | 5 refills | Status: AC | PRN
Start: 2021-11-13 — End: ?
  Filled 2021-11-13: qty 90, 30d supply, fill #0

## 2021-11-13 NOTE — Telephone Encounter (Signed)
Patient Advocate Encounter  Received notification from that the request for prior authorization for Botox 200UNIT solution has been denied due to Product not covered by [employer].. Prior Authorization not available.       Lyndel Safe, [place] Patient Advocate Specialist [place] Patient Advocate Team Direct Number: [phone]  Fax: [phone]

## 2021-11-13 NOTE — Telephone Encounter (Signed)
Patient Advocate Encounter  Prior Authorization for Botox 200UNIT solution has been approved through [facility].Marland Kitchen    PA# [id number] Effective dates: 11/13/2021 through 05/12/2022     Lyndel Safe, [place] Patient Advocate Specialist [place] Patient Advocate Team Direct Number: [phone]  Fax: [phone]

## 2021-11-13 NOTE — Patient Instructions (Addendum)
Stop cyclobenzaprine.  Take tizanidine '4mg'$  up to three times daily as needed.  At least take every night at bedtime. Stop propranolol Start botox.  Continue gabapentin '600mg'$  twice daily Follow up with Dr. [name] Follow up for Botox

## 2021-11-13 NOTE — Telephone Encounter (Signed)
Patient Advocate Encounter   Received notification fthat prior authorization for Botox 200UNIT solution is required through [facility] Medicaid.   PA submitted on 11/13/2021 Key [id number] Status is pending       Lyndel Safe, [place] Patient Advocate Specialist [place] Patient Advocate Team Direct Number: [phone]  Fax: [phone]

## 2021-11-13 NOTE — Telephone Encounter (Signed)
-----   Message from Lenon Oms, CPhT sent at 11/13/2021 12:10 PM EDT ----- Regarding: RE: BOTOX PA Ok I will work on the prior authorizations ----- Message ----- From: Venetia Night, CMA Sent: 11/13/2021  10:42 AM EDT To: Rx Prior Auth Team Subject: BOTOX PA                                       [place] Team, Happy Friday, This patient advise that she has both Medicaid and [employer] health Insurance.

## 2021-11-13 NOTE — Telephone Encounter (Signed)
Patient Advocate Encounter   Received notification that prior authorization for Botox 200UNIT solution is required through [employer].   PA submitted on 11/13/2021 Key [id number] Status is pending       Lyndel Safe, [place] Patient Advocate Specialist [place] Patient Advocate Team Direct Number: [phone]  Fax: [phone]

## 2021-11-13 NOTE — Telephone Encounter (Signed)
-----   Message from Lenon Oms, CPhT sent at 11/13/2021 12:10 PM EDT ----- Regarding: RE: BOTOX PA Ok I will work on the prior authorizations ----- Message ----- From: Venetia Night, CMA Sent: 11/13/2021  10:42 AM EDT To: Rx Prior Auth Team Subject: BOTOX PA                                       [place] Team, Happy Friday, This patient advise that she has both Medicaid and [employer] health Insurance.

## 2021-11-16 ENCOUNTER — Other Ambulatory Visit (HOSPITAL_COMMUNITY): Payer: Self-pay

## 2021-11-16 LAB — COLOGUARD: COLOGUARD: NEGATIVE

## 2021-11-16 MED ORDER — BOTOX 200 UNITS IJ SOLR
INTRAMUSCULAR | 4 refills | Status: DC
  Filled 2021-11-16 – 2021-11-18 (×2): qty 1, 90d supply, fill #0

## 2021-11-16 NOTE — Telephone Encounter (Signed)
Pa Approved through [employer]. Tried to run a test claim with [place] [facility] not available right now.  Will try back later.   Patient advised, Will update her once we try again.

## 2021-11-17 ENCOUNTER — Other Ambulatory Visit (HOSPITAL_COMMUNITY): Payer: Self-pay

## 2021-11-17 NOTE — Telephone Encounter (Signed)
Advised patient, Per Pharmacy rep, The system still has an error message. She will keep trying and give me a call back.

## 2021-11-17 NOTE — Telephone Encounter (Signed)
Pt called back in and left a message. She is requesting to speak with Sheena.

## 2021-11-18 ENCOUNTER — Other Ambulatory Visit (HOSPITAL_COMMUNITY): Payer: Self-pay

## 2021-11-18 MED ORDER — BOTOX 200 UNITS IJ SOLR: INTRAMUSCULAR | 4 refills | Status: DC

## 2021-11-18 NOTE — Telephone Encounter (Signed)
Telephone call to [facility], test claim ran Patient has to use [facility].  Telephone call to [facility] specialty pharmacy:[phone].  Please send Script Escribe.  Botox 200 units sent.   Patient advised, will keep her updated.

## 2021-11-19 NOTE — Telephone Encounter (Signed)
Telephone call to [facility] to check the status of Botox script. Per Rep, Script received, Please fax over a copy of insurance cards, and approval letter form [employer].    Everything faxed to [phone]

## 2021-11-20 NOTE — Telephone Encounter (Signed)
Telephone call to patient, to advise per [facility] they have tried to reach out to the patient. With no answer.  Please give them a all at [phone].

## 2021-11-23 ENCOUNTER — Other Ambulatory Visit (HOSPITAL_COMMUNITY): Payer: Self-pay

## 2021-11-23 ENCOUNTER — Telehealth: Payer: Self-pay | Admitting: Neurology

## 2021-11-23 MED ORDER — BOTOX 200 UNITS IJ SOLR: INTRAMUSCULAR | 4 refills | Status: DC

## 2021-11-23 NOTE — Telephone Encounter (Signed)
Telephone call to [place], Please fax over Insurance cards and Approval letter.  Status in Progress. Patient advised.

## 2021-11-23 NOTE — Telephone Encounter (Signed)
Advised patient spoke to [place] [facility] myself and [facility] and Advised patient has two insurances and to run it through [place].

## 2021-11-23 NOTE — Telephone Encounter (Signed)
Telephone call to [employer], spoke Lexi the [employer].  They have already advised the patient medication [facility]  [facility]otox will [facility] it [facility].

## 2021-11-23 NOTE — Telephone Encounter (Signed)
[facility] said [facility] and [place] has the wrong ins. They are trying to use [employer], and it should be healthy blue. She would like sheena to call and get it fixed so she can get her botox

## 2021-11-25 ENCOUNTER — Other Ambulatory Visit (HOSPITAL_COMMUNITY): Payer: Self-pay

## 2021-11-26 ENCOUNTER — Telehealth: Payer: Self-pay | Admitting: Neurology

## 2021-11-26 ENCOUNTER — Other Ambulatory Visit: Payer: Self-pay

## 2021-11-26 DIAGNOSIS — G43709 Chronic migraine without aura, not intractable, without status migrainosus: Secondary | ICD-10-CM

## 2021-11-26 NOTE — Telephone Encounter (Signed)
Bensen G from [employer] called and said they need a verbal authorization for Botox or a hard copy faxed, the prescription was transferred to them from [place].  Or fax to: [phone]  Call back for verbal: [phone]

## 2021-11-26 NOTE — Telephone Encounter (Signed)
Called the number listed above to [facility] and gave a verbal order

## 2021-11-27 NOTE — Progress Notes (Unsigned)
[place] [facility] [place] Phone: [phone] Subjective:   Rachel [name], am serving as a scribe for Dr. Hulan Saas.   I'm seeing this patient by the request  of:  Biagio Borg, MD  CC: back and neck pain   GBT:[id number]  Rachel [name] is a 56 y.o. female coming in with complaint of back and neck pain. OMT 10/29/2021. Patient states that she is glad that she has appointment today as she has been having increased tightness in neck and subsequent headaches.   Medications patient has been prescribed: None  Taking:         Reviewed prior external information including notes and imaging from previsou exam, outside providers and external EMR if available.   As well as notes that were available from care everywhere and other healthcare systems.  Past medical history, social, surgical and family history all reviewed in electronic medical record.  [name] pertanent information unless stated regarding to the chief complaint.   Past Medical History:  Diagnosis Date   ALLERGIC RHINITIS 10/04/2007   Anemia 01/21/2011   Anxiety 11/15/2018   ASTHMA 08/02/2007   INHALERS ONLY IN [place]   Asthma 08/02/2007   Qualifier: Diagnosis of  By: Wynona Luna    Blood transfusion without reported diagnosis    with first child    Cervical radiculopathy 01/21/2011   Cervicogenic headache 04/27/2017   COMMON MIGRAINE 05/15/2009   Depression 01/09/2010   Qualifier: Diagnosis of  By: Jenny Reichmann MD, [place], TYPE II 08/02/2007   Dysphagia, pharyngoesophageal phase 06/12/2014   GERD 08/02/2007   HYPERLIPIDEMIA 08/02/2007   Hyperlipidemia 08/02/2007   Qualifier: Diagnosis of  By: Wynona Luna    INSOMNIA-SLEEP [place] 01/04/2008   INTERMITTENT VERTIGO 05/15/2009   LIBIDO, DECREASED 01/09/2010   Migraine without aura 05/15/2009   Qualifier: Diagnosis of  By: Jenny Reichmann MD, [place] Oris    Nonallopathic lesion of rib cage 12/21/2017    Nonallopathic lesion of sacral region 07/29/2015   Nonallopathic lesion of thoracic region 07/29/2015   Osteoporosis 04/25/2019   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   Rheumatoid factor positive 02/10/2016   Right knee pain 01/31/2019   Injected January 31, 2019   Type 2 diabetes mellitus with hyperglycemia, with long-term current use of insulin ([place]) 08/02/2007   Qualifier: Diagnosis of  By: Wynona Luna     Allergies  Allergen Reactions   Lipitor [Atorvastatin]     REACTION: myalgias   Nitroglycerin      Review of Systems:  [name]  visual changes, nausea, vomiting, diarrhea, constipation, dizziness, abdominal pain, skin rash, fevers, chills, night sweats, weight loss, swollen lymph nodes, body aches, joint swelling, chest pain, shortness of breath, mood changes. POSITIVE muscle aches, headache  Objective  Blood pressure 110/82, pulse 96, height '5\' 1"'$  (1.549 m), weight 141 lb (64 kg), SpO2 97 %.   General: [name] apparent distress alert and oriented x3 mood and affect normal, dressed appropriately.  HEENT: Pupils equal, extraocular movements intact  Respiratory: Patient's speak in full sentences and does not appear short of breath  Cardiovascular: [name] lower extremity edema, non tender, [name] erythema  Gait normal  MSK:  Back low back exam mild loss of lordosis.  Tightness noted over the occipital area of the neck.  Patient does have limited sidebending bilaterally.  Patient is severely tender to palpation in multiple different areas.  Negative Spurling's.  Does have increased pain though noted.  Osteopathic findings  C2 flexed rotated and side bent right C6 flexed rotated and side bent left T3 extended rotated and side bent right inhaled rib T9 extended rotated and side bent left L2 flexed rotated and side bent right Sacrum right on right       Assessment and Plan:  Cervicogenic headache Worsening exertion of a chronic problem.  Patient has had this for multiple  years at this point.  Is seeing the neurologist on a more regular basis as well.  Discussed home exercises, which activities to do increase activity slowly otherwise.  Follow-up with me again in 6 to 8 weeks.  Toradol and Depo-Medrol given for the radicular symptoms and the worsening pain.    Nonallopathic problems  Decision today to treat with OMT was based on Physical Exam  After verbal consent patient was treated with HVLA, ME, FPR techniques in cervical, rib, thoracic, lumbar, and sacral  areas  Patient tolerated the procedure well with improvement in symptoms  Patient given exercises, stretches and lifestyle modifications  See medications in patient instructions if given  Patient will follow up in 4-8 weeks     The above documentation has been reviewed and is accurate and complete Lyndal Pulley, DO         Note: This dictation was prepared with Dragon dictation along with smaller phrase technology. Any transcriptional errors that result from this process are unintentional.

## 2021-11-30 ENCOUNTER — Encounter: Payer: Self-pay | Admitting: Family Medicine

## 2021-11-30 ENCOUNTER — Ambulatory Visit: Payer: Medicaid Other | Admitting: Family Medicine

## 2021-11-30 VITALS — BP 110/82 | HR 96 | Ht 61.0 in | Wt 141.0 lb

## 2021-11-30 DIAGNOSIS — G4486 Cervicogenic headache: Secondary | ICD-10-CM

## 2021-11-30 DIAGNOSIS — M9903 Segmental and somatic dysfunction of lumbar region: Secondary | ICD-10-CM | POA: Diagnosis not present

## 2021-11-30 DIAGNOSIS — M9904 Segmental and somatic dysfunction of sacral region: Secondary | ICD-10-CM | POA: Diagnosis not present

## 2021-11-30 DIAGNOSIS — M9902 Segmental and somatic dysfunction of thoracic region: Secondary | ICD-10-CM

## 2021-11-30 DIAGNOSIS — M9901 Segmental and somatic dysfunction of cervical region: Secondary | ICD-10-CM

## 2021-11-30 DIAGNOSIS — M9908 Segmental and somatic dysfunction of rib cage: Secondary | ICD-10-CM

## 2021-11-30 DIAGNOSIS — M2550 Pain in unspecified joint: Secondary | ICD-10-CM | POA: Diagnosis not present

## 2021-11-30 MED ORDER — KETOROLAC TROMETHAMINE 30 MG/ML IJ SOLN
30.0000 mg | Freq: Once | INTRAMUSCULAR | Status: AC
  Administered 2021-11-30: 30 mg via INTRAMUSCULAR

## 2021-11-30 MED ORDER — METHYLPREDNISOLONE ACETATE 40 MG/ML IJ SUSP
40.0000 mg | Freq: Once | INTRAMUSCULAR | Status: AC
  Administered 2021-11-30: 40 mg via INTRAMUSCULAR

## 2021-11-30 NOTE — Patient Instructions (Addendum)
Injections in backside today See me again in 6-8 weeks

## 2021-11-30 NOTE — Telephone Encounter (Signed)
BOTOX rec'd patient can be scheduled for next botox.  Dana please schedule patient.

## 2021-11-30 NOTE — Assessment & Plan Note (Signed)
Worsening exertion of a chronic problem.  Patient has had this for multiple years at this point.  Is seeing the neurologist on a more regular basis as well.  Discussed home exercises, which activities to do increase activity slowly otherwise.  Follow-up with me again in 6 to 8 weeks.  Toradol and Depo-Medrol given for the radicular symptoms and the worsening pain.

## 2021-12-04 ENCOUNTER — Other Ambulatory Visit: Payer: Self-pay | Admitting: Internal Medicine

## 2021-12-04 ENCOUNTER — Other Ambulatory Visit (HOSPITAL_COMMUNITY): Payer: Self-pay

## 2021-12-04 ENCOUNTER — Other Ambulatory Visit: Payer: Self-pay | Admitting: Neurology

## 2021-12-04 MED ORDER — GABAPENTIN 300 MG PO CAPS
600.0000 mg | ORAL_CAPSULE | Freq: Two times a day (BID) | ORAL | 5 refills | Status: DC
  Filled 2021-12-04: qty 120, 30d supply, fill #0
  Filled 2022-01-04: qty 120, 30d supply, fill #1
  Filled 2022-03-23: qty 120, 30d supply, fill #2
  Filled 2022-04-19: qty 120, 30d supply, fill #3
  Filled 2022-05-26: qty 120, 30d supply, fill #4
  Filled 2022-07-30: qty 120, 30d supply, fill #5

## 2021-12-04 MED ORDER — ONDANSETRON HCL 4 MG PO TABS
4.0000 mg | ORAL_TABLET | Freq: Three times a day (TID) | ORAL | 1 refills | Status: DC | PRN
  Filled 2021-12-04: qty 21, 30d supply, fill #0
  Filled 2022-09-02: qty 21, 30d supply, fill #1

## 2021-12-30 ENCOUNTER — Ambulatory Visit: Payer: 59 | Admitting: Neurology

## 2022-01-04 ENCOUNTER — Other Ambulatory Visit: Payer: Self-pay | Admitting: Internal Medicine

## 2022-01-04 ENCOUNTER — Other Ambulatory Visit (HOSPITAL_COMMUNITY): Payer: Self-pay

## 2022-01-04 MED ORDER — BUTALBITAL-APAP-CAFFEINE 50-325-40 MG PO TABS
1.0000 | ORAL_TABLET | Freq: Four times a day (QID) | ORAL | 3 refills | Status: DC | PRN
  Filled 2022-01-04: qty 30, 15d supply, fill #0
  Filled 2022-02-12: qty 30, 15d supply, fill #1
  Filled 2022-03-29: qty 30, 15d supply, fill #2

## 2022-01-05 ENCOUNTER — Other Ambulatory Visit (HOSPITAL_COMMUNITY): Payer: Self-pay

## 2022-01-07 NOTE — Progress Notes (Signed)
Rachel [name] Phone: [phone] Subjective:   Rachel [name], am serving as a scribe for Dr. Hulan Saas.   I'm seeing this patient by the request  of:  Biagio Borg, MD  CC: Neck pain and headache follow-up  [id number]  Rachel [name] is a 56 y.o. female coming in with complaint of back and neck pain. OMT 11/30/2021. Patient states that her neck is the same as last visit.   L knee pain has increased recently. Pain over anterior aspect. Painful to touch the knee.   Medications patient has been prescribed: None  Taking:         Reviewed prior external information including notes and imaging from previsou exam, outside providers and external EMR if available.   As well as notes that were available from care everywhere and other healthcare systems.  Past medical history, social, surgical and family history all reviewed in electronic medical record.  [name] pertanent information unless stated regarding to the chief complaint.   Past Medical History:  Diagnosis Date   ALLERGIC RHINITIS 10/04/2007   Anemia 01/21/2011   Anxiety 11/15/2018   ASTHMA 08/02/2007   INHALERS ONLY IN [place]   Asthma 08/02/2007   Qualifier: Diagnosis of  By: Wynona Luna    Blood transfusion without reported diagnosis    with first child    Cervical radiculopathy 01/21/2011   Cervicogenic headache 04/27/2017   COMMON MIGRAINE 05/15/2009   Depression 01/09/2010   Qualifier: Diagnosis of  By: Jenny Reichmann MD, [place], TYPE II 08/02/2007   Dysphagia, pharyngoesophageal phase 06/12/2014   GERD 08/02/2007   HYPERLIPIDEMIA 08/02/2007   Hyperlipidemia 08/02/2007   Qualifier: Diagnosis of  By: Wynona Luna    INSOMNIA-SLEEP [place] 01/04/2008   INTERMITTENT VERTIGO 05/15/2009   LIBIDO, DECREASED 01/09/2010   Migraine without aura 05/15/2009   Qualifier: Diagnosis of  By: Jenny Reichmann MD, [place]    Nonallopathic  lesion of rib cage 12/21/2017   Nonallopathic lesion of sacral region 07/29/2015   Nonallopathic lesion of thoracic region 07/29/2015   Osteoporosis 04/25/2019   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   Rheumatoid factor positive 02/10/2016   Right knee pain 01/31/2019   Injected January 31, 2019   Type 2 diabetes mellitus with hyperglycemia, with long-term current use of insulin ([place]) 08/02/2007   Qualifier: Diagnosis of  By: Wynona Luna     Allergies  Allergen Reactions   Lipitor [Atorvastatin]     REACTION: myalgias   Nitroglycerin      Review of Systems:  [name] headache, visual changes, nausea, vomiting, diarrhea, constipation, dizziness, abdominal pain, skin rash, fevers, chills, night sweats, weight loss, swollen lymph nodes, body aches, joint swelling, chest pain, shortness of breath, mood changes. POSITIVE muscle aches  Objective  Blood pressure 100/76, pulse (!) 102, height '5\' 1"'$  (1.549 m), weight 143 lb (64.9 kg), SpO2 99 %.   General: [name] apparent distress alert and oriented x3 mood and affect normal, dressed appropriately.  HEENT: Pupils equal, extraocular movements intact  Respiratory: Patient's speak in full sentences and does not appear short of breath  Cardiovascular: [name] lower extremity edema, non tender, [name] erythema  Gait MSK:  Back does have tightness noted in the thoracolumbar juncture.  Neck exam has severe tenderness at the occipital cervical area.  Patient does have tightness noted with sidebending bilaterally.  Patient does have a  lack of 5 degrees of extension and 5 degrees of flexion of the neck.  Negative Spurling's for radicular symptoms Right knee exam does have some trace effusion noted.  Left knee is tender to palpation of the medial joint line.  Does have crepitus noted with range of motion.  Lateral tracking of the patella noted.  Osteopathic findings  C2 flexed rotated and side bent right C5 flexed rotated and side bent left T3 extended  rotated and side bent right inhaled rib T9 extended rotated and side bent left L2 flexed rotated and side bent right Sacrum right on right  After informed written and verbal consent, patient was seated on exam table. Left knee was prepped with alcohol swab and utilizing anterolateral approach, patient's left knee space was injected with 4:1  marcaine 0.5%: Kenalog '40mg'$ /dL. Patient tolerated the procedure well without immediate complications.     Assessment and Plan:  Cervicogenic headache Continue cervicogenic headaches.  Discussed with patient about icing regimen and home exercises. Chronic problem with worsening symptoms.  If patient does have manipulation regularly as well as takes her medications regularly she seems to do better.  Denies any fevers chills or any abnormal weight loss.  Follow-up again in 6 to 8 weeks otherwise.    Nonallopathic problems  Decision today to treat with OMT was based on Physical Exam  After verbal consent patient was treated with HVLA, ME, FPR techniques in cervical, rib, thoracic, lumbar, and sacral  areas  Patient tolerated the procedure well with improvement in symptoms  Patient given exercises, stretches and lifestyle modifications  See medications in patient instructions if given  Patient will follow up in 4-8 weeks             Note: This dictation was prepared with Dragon dictation along with smaller phrase technology. Any transcriptional errors that result from this process are unintentional.

## 2022-01-11 ENCOUNTER — Ambulatory Visit (INDEPENDENT_AMBULATORY_CARE_PROVIDER_SITE_OTHER): Payer: Medicaid Other | Admitting: Family Medicine

## 2022-01-11 ENCOUNTER — Telehealth: Payer: Self-pay

## 2022-01-11 VITALS — BP 100/76 | HR 102 | Ht 61.0 in | Wt 143.0 lb

## 2022-01-11 DIAGNOSIS — M170 Bilateral primary osteoarthritis of knee: Secondary | ICD-10-CM

## 2022-01-11 DIAGNOSIS — G4486 Cervicogenic headache: Secondary | ICD-10-CM

## 2022-01-11 DIAGNOSIS — M9908 Segmental and somatic dysfunction of rib cage: Secondary | ICD-10-CM

## 2022-01-11 DIAGNOSIS — M9902 Segmental and somatic dysfunction of thoracic region: Secondary | ICD-10-CM | POA: Diagnosis not present

## 2022-01-11 DIAGNOSIS — M9904 Segmental and somatic dysfunction of sacral region: Secondary | ICD-10-CM

## 2022-01-11 DIAGNOSIS — M9903 Segmental and somatic dysfunction of lumbar region: Secondary | ICD-10-CM

## 2022-01-11 DIAGNOSIS — M9901 Segmental and somatic dysfunction of cervical region: Secondary | ICD-10-CM

## 2022-01-11 NOTE — Assessment & Plan Note (Signed)
Patient is responding extremely well to the viscosupplementation of the right knee but continues to have some left knee pain.  Steroid injection given today and tolerated the procedure well.  Discussed which activities to do and which ones to avoid.  Increase activity slowly otherwise.  Follow-up again in 6 to 8 weeks.

## 2022-01-11 NOTE — Patient Instructions (Signed)
Good to see you  Steroid injection L knee Find time for yourself See me in 4-6 weeks

## 2022-01-11 NOTE — Assessment & Plan Note (Signed)
Continue cervicogenic headaches.  Discussed with patient about icing regimen and home exercises. Chronic problem with worsening symptoms.  If patient does have manipulation regularly as well as takes her medications regularly she seems to do better.  Denies any fevers chills or any abnormal weight loss.  Follow-up again in 6 to 8 weeks otherwise.

## 2022-01-11 NOTE — Telephone Encounter (Signed)
Pt is calling to state her GERD medication isn't helping. Pt will call back with the name of the medication that she is currently taking.

## 2022-01-12 ENCOUNTER — Other Ambulatory Visit (HOSPITAL_COMMUNITY): Payer: Self-pay

## 2022-01-12 NOTE — Telephone Encounter (Signed)
Pt is currently taking dexlansoprazole (DEXILANT) 60 MG capsule and she is requesting something different.

## 2022-01-12 NOTE — Telephone Encounter (Signed)
Since her case is complicated, she should ask Dr Loletha Carrow or see him at [facility], thanks

## 2022-01-13 NOTE — Telephone Encounter (Signed)
I left a VM for pt to call back for Dr. Jenny Reichmann recommendations to call ask Dr Loletha Carrow or see him at [facility], thanks.

## 2022-01-14 ENCOUNTER — Other Ambulatory Visit (HOSPITAL_COMMUNITY)
Admission: RE | Admit: 2022-01-14 | Discharge: 2022-01-14 | Disposition: A | Discharge status: Home / Self Care | Payer: 59 | Source: Ambulatory Visit | Attending: Nurse Practitioner | Admitting: Nurse Practitioner

## 2022-01-14 ENCOUNTER — Other Ambulatory Visit (HOSPITAL_COMMUNITY): Payer: Self-pay

## 2022-01-14 ENCOUNTER — Other Ambulatory Visit: Payer: Self-pay | Admitting: Nurse Practitioner

## 2022-01-14 DIAGNOSIS — N952 Postmenopausal atrophic vaginitis: Secondary | ICD-10-CM | POA: Diagnosis not present

## 2022-01-14 DIAGNOSIS — M85852 Other specified disorders of bone density and structure, left thigh: Secondary | ICD-10-CM | POA: Diagnosis not present

## 2022-01-14 DIAGNOSIS — Z124 Encounter for screening for malignant neoplasm of cervix: Secondary | ICD-10-CM | POA: Diagnosis not present

## 2022-01-14 DIAGNOSIS — Z0000 Encounter for general adult medical examination without abnormal findings: Secondary | ICD-10-CM | POA: Diagnosis not present

## 2022-01-14 DIAGNOSIS — R8290 Unspecified abnormal findings in urine: Secondary | ICD-10-CM | POA: Diagnosis not present

## 2022-01-15 ENCOUNTER — Other Ambulatory Visit: Payer: Self-pay | Admitting: Nurse Practitioner

## 2022-01-15 ENCOUNTER — Other Ambulatory Visit (HOSPITAL_COMMUNITY): Payer: Self-pay

## 2022-01-15 ENCOUNTER — Telehealth: Payer: Self-pay

## 2022-01-15 DIAGNOSIS — M85852 Other specified disorders of bone density and structure, left thigh: Secondary | ICD-10-CM

## 2022-01-15 DIAGNOSIS — E1165 Type 2 diabetes mellitus with hyperglycemia: Secondary | ICD-10-CM

## 2022-01-15 LAB — CYTOLOGY - PAP
Comment: NEGATIVE
Diagnosis: NEGATIVE
High risk HPV: NEGATIVE

## 2022-01-15 MED ORDER — INSULIN GLARGINE-YFGN 100 UNIT/ML ~~LOC~~ SOPN
28.0000 [IU] | PEN_INJECTOR | Freq: Every day | SUBCUTANEOUS | 0 refills | Status: DC
  Filled 2022-01-15: qty 24, 86d supply, fill #0

## 2022-01-15 NOTE — Telephone Encounter (Signed)
Great! Ty! 

## 2022-01-15 NOTE — Telephone Encounter (Signed)
Pharmacy lvm to advise Lantus is not covered and insurance is requiring Semglee.

## 2022-01-18 ENCOUNTER — Other Ambulatory Visit (HOSPITAL_COMMUNITY): Payer: Self-pay

## 2022-01-29 ENCOUNTER — Ambulatory Visit (INDEPENDENT_AMBULATORY_CARE_PROVIDER_SITE_OTHER): Payer: 59 | Admitting: Neurology

## 2022-01-29 DIAGNOSIS — G43709 Chronic migraine without aura, not intractable, without status migrainosus: Secondary | ICD-10-CM

## 2022-01-29 MED ORDER — ONABOTULINUMTOXINA 200 UNITS IJ SOLR
200.0000 [IU] | Freq: Once | INTRAMUSCULAR | Status: AC
  Administered 2022-01-29: 155 [IU] via INTRAMUSCULAR

## 2022-01-29 NOTE — Progress Notes (Signed)
[facility]  ° °Procedure Note Botox ° °Attending: Dr. Armiyah Capron ° °Preoperative Diagnosis(es): Chronic migraine ° °Consent obtained from: The patient °Benefits discussed included, but were not limited to decreased muscle tightness, increased joint range of motion, and decreased pain.  Risk discussed included, but were not limited pain and discomfort, bleeding, bruising, excessive weakness, venous thrombosis, muscle atrophy and dysphagia.  Anticipated outcomes of the procedure as well as he risks and benefits of the alternatives to the procedure, and the roles and tasks of the personnel to be involved, were discussed with the patient, and the patient consents to the procedure and agrees to proceed. A copy of the patient medication guide was given to the patient which explains the blackbox warning. ° °Patients identity and treatment sites confirmed Yes.  . ° °Details of Procedure: °Skin was cleaned with alcohol. Prior to injection, the needle plunger was aspirated to make sure the needle was not within a blood vessel.  There was no blood retrieved on aspiration.   ° °Following is a summary of the muscles injected  And the amount of Botulinum toxin used: ° °Dilution °200 units of Botox was reconstituted with 4 ml of preservative free normal saline. °Time of reconstitution: At the time of the office visit (<30 minutes prior to injection)  ° °Injections  °155 total units of Botox was injected with a 30 gauge needle. ° °Injection Sites: °L occipitalis: 15 units- 3 sites  °R occiptalis: 15 units- 3 sites ° °L upper trapezius: 15 units- 3 sites °R upper trapezius: 15 units- 3 sits          °L paraspinal: 10 units- 2 sites °R paraspinal: 10 units- 2 sites ° °Face °L frontalis(2 injection sites):10 units   °R frontalis(2 injection sites):10 units         °L corrugator: 5 units   °R corrugator: 5 units           °Procerus: 5 units   °L temporalis: 20 units °R temporalis: 20 units  ° °Agent:  °200 units of botulinum Type  A (Onobotulinum Toxin type A) was reconstituted with 4 ml of preservative free normal saline.  °Time of reconstitution: At the time of the office visit (<30 minutes prior to injection)  ° ° ° Total injected (Units):  155 ° Total wasted (Units):  45 ° °Patient tolerated procedure well without complications.   °Reinjection is anticipated in 3 months. ° ° °

## 2022-02-05 NOTE — Progress Notes (Unsigned)
[facility] [address] [place] Phone: [phone] Subjective:   [name], am serving as a scribe for Dr. Hulan Saas.  I'm seeing this patient by the request  of:  Biagio Borg, MD  CC: back and neck pain follow up   CLE:[id number]  Rachel [name] is a 56 y.o. female coming in with complaint of back and neck pain. OMT 01/11/2022.  Continues to have cervicogenic headaches.  Was given Botox by neurologist recently.  Patient states that her neck is the same but her lower back seems to be bothering her more. Uses gabapentin prn as it works sometimes and then not.   Medications patient has been prescribed: None  Taking:         Reviewed prior external information including notes and imaging from previsou exam, outside providers and external EMR if available.   As well as notes that were available from care everywhere and other healthcare systems.  Patient did see neurology recently on 18 August and did have Botox injection.  Past medical history, social, surgical and family history all reviewed in electronic medical record.  No pertanent information unless stated regarding to the chief complaint.   Past Medical History:  Diagnosis Date   ALLERGIC RHINITIS 10/04/2007   Anemia 01/21/2011   Anxiety 11/15/2018   ASTHMA 08/02/2007   INHALERS ONLY IN [place]   Asthma 08/02/2007   Qualifier: Diagnosis of  By: Wynona Luna    Blood transfusion without reported diagnosis    with first child    Cervical radiculopathy 01/21/2011   Cervicogenic headache 04/27/2017   COMMON MIGRAINE 05/15/2009   Depression 01/09/2010   Qualifier: Diagnosis of  By: Jenny Reichmann MD, [place], TYPE II 08/02/2007   Dysphagia, pharyngoesophageal phase 06/12/2014   GERD 08/02/2007   HYPERLIPIDEMIA 08/02/2007   Hyperlipidemia 08/02/2007   Qualifier: Diagnosis of  By: Wynona Luna    INSOMNIA-SLEEP [place] 01/04/2008   INTERMITTENT  VERTIGO 05/15/2009   LIBIDO, DECREASED 01/09/2010   Migraine without aura 05/15/2009   Qualifier: Diagnosis of  By: Jenny Reichmann MD, [place]    Nonallopathic lesion of rib cage 12/21/2017   Nonallopathic lesion of sacral region 07/29/2015   Nonallopathic lesion of thoracic region 07/29/2015   Osteoporosis 04/25/2019   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   Rheumatoid factor positive 02/10/2016   Right knee pain 01/31/2019   Injected January 31, 2019   Type 2 diabetes mellitus with hyperglycemia, with long-term current use of insulin ([place]) 08/02/2007   Qualifier: Diagnosis of  By: Wynona Luna     Allergies  Allergen Reactions   Lipitor [Atorvastatin]     REACTION: myalgias   Nitroglycerin      Review of Systems:  No headache, visual changes, nausea, vomiting, diarrhea, constipation, dizziness, abdominal pain, skin rash, fevers, chills, night sweats, weight loss, swollen lymph nodes, body aches, joint swelling, chest pain, shortness of breath, mood changes. POSITIVE muscle aches  Objective  Blood pressure 92/62, pulse 98, height '5\' 1"'$  (1.549 m), weight 143 lb (64.9 kg), SpO2 99 %.   General: No apparent distress alert and oriented x3 mood and affect normal, dressed appropriately.  HEENT: Pupils equal, extraocular movements intact  Respiratory: Patient's speak in full sentences and does not appear short of breath  Cardiovascular: No lower extremity edema, non tender, no erythema  Gait normal  MSK:  Back does have some loss of  lordosis.  Some tenderness to palpation in the paraspinal musculature.  Patient's neck exam severe tightness in the occipital area of the neck.  Patient also has diffuse tenderness to light sensation even in multiple different areas.  Negative Spurling's of the neck.  Osteopathic findings  C4 flexed rotated and side bent right C6 flexed rotated and side bent left T4 extended rotated and side bent right inhaled rib T7 extended rotated and side bent left L2  flexed rotated and side bent right Sacrum right on right       Assessment and Plan:  Cervicogenic headache Chronic problem with worsening symptoms.  Has recently gotten Botox from the neurologist.  Will need to consider other different medications with patient still not breaking through.  Patient has noticed more at nighttime headaches.  Will get sleep study to rule out such things as sleep apnea is potentially contributing.  Follow-up with him in 6 to 8 weeks  Snoring Sleep study ordered.    Nonallopathic problems  Decision today to treat with OMT was based on Physical Exam  After verbal consent patient was treated with HVLA, ME, FPR techniques in cervical, rib, thoracic, lumbar, and sacral  areas  Patient tolerated the procedure well with improvement in symptoms  Patient given exercises, stretches and lifestyle modifications  See medications in patient instructions if given  Patient will follow up in 4-8 weeks    The above documentation has been reviewed and is accurate and complete Lyndal Pulley, DO          Note: This dictation was prepared with Dragon dictation along with smaller phrase technology. Any transcriptional errors that result from this process are unintentional.

## 2022-02-08 ENCOUNTER — Encounter: Payer: Self-pay | Admitting: Family Medicine

## 2022-02-08 ENCOUNTER — Ambulatory Visit (INDEPENDENT_AMBULATORY_CARE_PROVIDER_SITE_OTHER): Payer: Medicaid Other | Admitting: Family Medicine

## 2022-02-08 VITALS — BP 92/62 | HR 98 | Ht 61.0 in | Wt 143.0 lb

## 2022-02-08 DIAGNOSIS — M9908 Segmental and somatic dysfunction of rib cage: Secondary | ICD-10-CM

## 2022-02-08 DIAGNOSIS — M9904 Segmental and somatic dysfunction of sacral region: Secondary | ICD-10-CM

## 2022-02-08 DIAGNOSIS — R0683 Snoring: Secondary | ICD-10-CM

## 2022-02-08 DIAGNOSIS — M9901 Segmental and somatic dysfunction of cervical region: Secondary | ICD-10-CM | POA: Diagnosis not present

## 2022-02-08 DIAGNOSIS — M9903 Segmental and somatic dysfunction of lumbar region: Secondary | ICD-10-CM

## 2022-02-08 DIAGNOSIS — M9902 Segmental and somatic dysfunction of thoracic region: Secondary | ICD-10-CM

## 2022-02-08 DIAGNOSIS — G4486 Cervicogenic headache: Secondary | ICD-10-CM | POA: Diagnosis not present

## 2022-02-08 NOTE — Assessment & Plan Note (Signed)
Sleep study ordered

## 2022-02-08 NOTE — Patient Instructions (Signed)
Sleep study-Pulmonology will call you See me in 5-6 weeks

## 2022-02-08 NOTE — Assessment & Plan Note (Signed)
Chronic problem with worsening symptoms.  Has recently gotten Botox from the neurologist.  Will need to consider other different medications with patient still not breaking through.  Patient has noticed more at nighttime headaches.  Will get sleep study to rule out such things as sleep apnea is potentially contributing.  Follow-up with him in 6 to 8 weeks

## 2022-02-09 ENCOUNTER — Other Ambulatory Visit (HOSPITAL_COMMUNITY): Payer: Self-pay

## 2022-02-12 ENCOUNTER — Other Ambulatory Visit (HOSPITAL_COMMUNITY): Payer: Self-pay

## 2022-02-18 ENCOUNTER — Other Ambulatory Visit (HOSPITAL_COMMUNITY): Payer: Self-pay

## 2022-02-24 ENCOUNTER — Encounter: Payer: Self-pay | Admitting: Nurse Practitioner

## 2022-02-24 ENCOUNTER — Ambulatory Visit (INDEPENDENT_AMBULATORY_CARE_PROVIDER_SITE_OTHER): Payer: Medicaid Other | Admitting: Nurse Practitioner

## 2022-02-24 VITALS — BP 126/76 | HR 88 | Ht 61.0 in | Wt 143.4 lb

## 2022-02-24 DIAGNOSIS — G4719 Other hypersomnia: Secondary | ICD-10-CM | POA: Diagnosis not present

## 2022-02-24 DIAGNOSIS — G4700 Insomnia, unspecified: Secondary | ICD-10-CM

## 2022-02-24 DIAGNOSIS — R0683 Snoring: Secondary | ICD-10-CM

## 2022-02-24 NOTE — Progress Notes (Signed)
Reviewed and agree with assessment/plan.   Chesley Mires, MD [facility] Pulmonary/Critical Care 02/24/2022, 12:23 PM Pager:  [phone]

## 2022-02-24 NOTE — Progress Notes (Addendum)
$'@Patient'u$  ID: Rachel [name], female    DOB: Nov 22, 1965, 56 y.o.   MRN: [id number]  Chief Complaint  Patient presents with   Follow-up    Pt sleep consult. Reports snoring, headaches, and daytime tiredness.     Referring provider: Lyndal Pulley, DO  HPI: 56 year old female, never smoker referred for sleep consult.  Past medical history significant for migraines, orthostatic hypotension, asthma, GERD, DM 2, vertigo, HLD, depression, insomnia, anxiety, positive rheumatoid factor.  TEST/EVENTS:   02/24/2022: Today-sleep consult Patient presents today for sleep consult referred by Dr. [name] with sports medicine.  She has been struggling with chronic headaches for a while now.  Neurology work-up was unremarkable.  She was seen by Dr. [name] for possible cervicogenic headaches.  He started inquiring about her sleep.  She reported that she has been told that she snores very loudly by her husband.  She also wakes up throughout the night and feels fatigued during the day.  He wanted her to undergo sleep study for further work-up and to rule out OSA as underlying cause of her headaches.  Today, she reports that she has had the symptoms for many years.  She wakes up frequently throughout the night for various reasons; struggles to go back to sleep.  Wakes up in the morning feeling poorly rested.  She also usually has a dull, aching headache.  She denies any witnessed apneas, drowsy driving, sleep parasomnia/paralysis, narcolepsy or cataplexy. She goes to bed between 10 to 11 PM.  It takes her a long time to fall asleep; some times hours.  She does not take anything to help her fall asleep at night.  She will occasionally drink a herbal tea or listen to her Bible which help relax her.  Wakes 2-3 times a night.  Officially gets out of bed in the morning around 8 AM.  Weight has been stable over the last few years.  No history of stroke or heart failure.  She does have diabetes which is controlled with  insulin and Ozempic. She has never smoker.  Does not drink any alcohol.  Lives at home with her husband.  She is a former [employer]; currently on disability.  Family history significant for father with asthma and heart disease.   Epworth 20   Allergies  Allergen Reactions   Lipitor [Atorvastatin]     REACTION: myalgias   Nitroglycerin     Immunization History  Administered Date(s) Administered   H1N1 04/15/2008   Influenza Whole 04/22/2008, 03/14/2010   Influenza,inj,Quad PF,6+ Mos 03/13/2014, 02/28/2019, 02/26/2020   Influenza-Unspecified 03/09/2017, 02/14/2018   Moderna Sars-Covid-2 Vaccination 04/25/2020, 06/03/2020   Pneumococcal Conjugate-13 07/09/2015   Pneumococcal Polysaccharide-23 07/08/2008, 06/25/2016   Td 10/03/2006   Tdap 06/25/2016    Past Medical History:  Diagnosis Date   ALLERGIC RHINITIS 10/04/2007   Anemia 01/21/2011   Anxiety 11/15/2018   ASTHMA 08/02/2007   INHALERS ONLY IN [place]   Asthma 08/02/2007   Qualifier: Diagnosis of  By: Wynona Luna    Blood transfusion without reported diagnosis    with first child    Cervical radiculopathy 01/21/2011   Cervicogenic headache 04/27/2017   COMMON MIGRAINE 05/15/2009   Depression 01/09/2010   Qualifier: Diagnosis of  By: Jenny Reichmann MD, Hunt Oris    DIABETES MELLITUS, TYPE II 08/02/2007   Dysphagia, pharyngoesophageal phase 06/12/2014   GERD 08/02/2007   HYPERLIPIDEMIA 08/02/2007   Hyperlipidemia 08/02/2007   Qualifier: Diagnosis of  By: Wynona Luna  INSOMNIA-SLEEP DISORDER-UNSPEC 01/04/2008   INTERMITTENT VERTIGO 05/15/2009   LIBIDO, DECREASED 01/09/2010   Migraine without aura 05/15/2009   Qualifier: Diagnosis of  By: Jenny Reichmann MD, Hunt Oris    Nonallopathic lesion of rib cage 12/21/2017   Nonallopathic lesion of sacral region 07/29/2015   Nonallopathic lesion of thoracic region 07/29/2015   Osteoporosis 04/25/2019   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   Rheumatoid factor positive 02/10/2016   Right knee pain  01/31/2019   Injected January 31, 2019   Type 2 diabetes mellitus with hyperglycemia, with long-term current use of insulin ([place]) 08/02/2007   Qualifier: Diagnosis of  By: Wynona Luna     Tobacco History: Social History   Tobacco Use  Smoking Status Never  Smokeless Tobacco Never   Counseling given: Not Answered   Outpatient Medications Prior to Visit  Medication Sig Dispense Refill   aspirin 81 MG EC tablet Take 81 mg by mouth daily.     Blood Glucose Monitoring Suppl (BLOOD GLUCOSE MONITOR SYSTEM) w/Device KIT Use as instructed to check blood sugar 2 times daily 1 kit 0   Botulinum Toxin Type A (BOTOX) 200 units SOLR Inject 155 units IM Into multiple sites in the face,neck,and head every 90 days 1 each 4   butalbital-acetaminophen-caffeine (FIORICET) 50-325-40 MG tablet Take 1 tablet by mouth every 6 (six) hours as needed for headache. Max 2 tablets per day 30 tablet 3   Cholecalciferol (VITAMIN D) 50 MCG (2000 UT) CAPS Take 4,000 Units by mouth daily.     dexlansoprazole (DEXILANT) 60 MG capsule Take 1 capsule (60 mg total) by mouth daily. 90 capsule 3   Diclofenac Sodium (PENNSAID) 2 % SOLN Place 2 g onto the skin 2 (two) times daily. 112 g 3   DULoxetine (CYMBALTA) 60 MG capsule Take 1 capsule (60 mg total) by mouth daily. 90 capsule 3   eletriptan (RELPAX) 40 MG tablet Take 1 tablet (40 mg total) by mouth as needed for migraine or headache (May repeat after 2 hours.  Maximum 2 tablets in 24 hours). May repeat in 2 hours if headache persists or recurs. 10 tablet 5   gabapentin (NEURONTIN) 300 MG capsule Take 2 capsules (600 mg total) by mouth 2 (two) times daily. 120 capsule 5   glucose blood (CONTOUR NEXT TEST) test strip Use as instructed to check blood sugar 2 times daily 200 each 3   insulin glargine-yfgn (SEMGLEE) 100 UNIT/ML Pen Inject 28 Units into the skin daily. 30 mL 0   linaclotide (LINZESS) 145 MCG CAPS capsule Take 1 capsule (145 mcg total) by mouth daily before  breakfast 30 capsule 2   metoCLOPramide (REGLAN) 5 MG tablet Take 1 tablet (5 mg total) by mouth 3 (three) times daily 30 minutes before meals 90 tablet 1   Multiple Vitamin (MULTIVITAMIN WITH MINERALS) TABS tablet Take 1 tablet by mouth daily.     ondansetron (ZOFRAN ODT) 8 MG disintegrating tablet Take 1 tablet (8 mg total) by mouth every 8 (eight) hours as needed for nausea or vomiting. 30 tablet 0   ondansetron (ZOFRAN) 4 MG tablet Take 1 tablet (4 mg total) by mouth every 8 (eight) hours as needed for nausea & vomiting 40 tablet 1   propranolol (INDERAL) 20 MG tablet Take 1.5 tablets (30 mg total) by mouth 2 (two) times daily. 90 tablet 5   Semaglutide,0.25 or 0.'5MG'$ /DOS, (OZEMPIC, 0.25 OR 0.5 MG/DOSE,) 2 MG/3ML SOPN Inject 0.5 mg into the skin once a week. 9  mL 2   tiZANidine (ZANAFLEX) 4 MG tablet Take 1 tablet (4 mg total) by mouth every 8 (eight) hours as needed for muscle spasms. 90 tablet 5   triamcinolone (NASACORT) 55 MCG/ACT AERO nasal inhaler Place 2 sprays into the nose daily. 1 Inhaler 12   albuterol (VENTOLIN HFA) 108 (90 Base) MCG/ACT inhaler INHALE 2 PUFFS INTO THE LUNGS EVERY 6 (SIX) HOURS AS NEEDED FOR WHEEZING OR SHORTNESS OF BREATH. 18 g 5   No facility-administered medications prior to visit.     Review of Systems:   Constitutional: No weight loss or gain, night sweats, fevers, chills, or lassitude. +excessive daytime fatigue HEENT: No difficulty swallowing, tooth/dental problems, or sore throat. No sneezing, itching, ear ache, nasal congestion, or post nasal drip. +headaches CV:  No chest pain, orthopnea, PND, swelling in lower extremities, anasarca, dizziness, palpitations, syncope Resp: +shortness of breath with exertion (baseline); occasional cough. No excess mucus or change in color of mucus. No hemoptysis. No wheezing.  No chest wall deformity GI:  +heartburn, indigestion. No abdominal pain, nausea, vomiting, diarrhea, change in bowel habits, loss of appetite,  bloody stools.  GU: No dysuria, change in color of urine, urgency or frequency.  No flank pain, no hematuria  Skin: No rash, lesions, ulcerations MSK:  No joint pain or swelling.  No decreased range of motion.  No back pain. Neuro: No dizziness or lightheadedness.  Psych: No anxiety. +depression (stable). No SI/HI. Mood stable.     Physical Exam:  BP 126/76   Pulse 88   Ht '5\' 1"'$  (1.549 m)   Wt 143 lb 6.4 oz (65 kg)   SpO2 97%   BMI 27.10 kg/m   GEN: Pleasant, interactive, well-appearing; in no acute distress. HEENT:  Normocephalic and atraumatic. PERRLA. Sclera white. Nasal turbinates pink, moist and patent bilaterally. No rhinorrhea present. Oropharynx pink and moist, without exudate or edema. No lesions, ulcerations, or postnasal drip. Mallampati II NECK:  Supple w/ fair ROM. No JVD present. Normal carotid impulses w/o bruits. Thyroid symmetrical with no goiter or nodules palpated. No lymphadenopathy.   CV: RRR, no m/r/g, no peripheral edema. Pulses intact, +2 bilaterally. No cyanosis, pallor or clubbing. PULMONARY:  Unlabored, regular breathing. Clear bilaterally A&P w/o wheezes/rales/rhonchi. No accessory muscle use. No dullness to percussion. GI: BS present and normoactive. Soft, non-tender to palpation.  MSK: No erythema, warmth or tenderness. No deformities or joint swelling noted.  Neuro: A/Ox3. No focal deficits noted.   Skin: Warm, no lesions or rashe Psych: Normal affect and behavior. Judgement and thought content appropriate.     Lab Results:  CBC    Component Value Date/Time   WBC 8.4 10/12/2021 0856   RBC 4.24 10/12/2021 0856   HGB 11.5 (L) 10/12/2021 0856   HGB 12.4 02/20/2020 1402   HGB 11.9 03/29/2011 1034   HCT 35.0 (L) 10/12/2021 0856   HCT 37.5 02/20/2020 1402   HCT 36.6 03/29/2011 1034   PLT 380.0 10/12/2021 0856   PLT 367 02/20/2020 1402   MCV 82.5 10/12/2021 0856   MCV 81 02/20/2020 1402   MCV 75.5 (L) 03/29/2011 1034   MCH 26.7 02/20/2020 1402    MCH 26.0 01/21/2020 1250   MCHC 32.8 10/12/2021 0856   RDW 14.7 10/12/2021 0856   RDW 13.9 02/20/2020 1402   RDW 14.5 03/29/2011 1034   LYMPHSABS 4.6 (H) 10/12/2021 0856   LYMPHSABS 4.1 (H) 03/29/2011 1034   MONOABS 0.7 10/12/2021 0856   MONOABS 0.7 03/29/2011 1034   EOSABS  0.1 10/12/2021 0856   EOSABS 0.2 03/29/2011 1034   BASOSABS 0.1 10/12/2021 0856   BASOSABS 0.1 03/29/2011 1034    BMET    Component Value Date/Time   NA 137 10/12/2021 0856   NA 136 02/20/2020 1402   K 3.7 10/12/2021 0856   CL 101 10/12/2021 0856   CO2 27 10/12/2021 0856   GLUCOSE 175 (H) 10/12/2021 0856   BUN 9 10/12/2021 0856   BUN 8 02/20/2020 1402   CREATININE 0.68 10/12/2021 0856   CALCIUM 9.1 10/12/2021 0856   GFRNONAA 89 02/20/2020 1402   GFRAA 102 02/20/2020 1402    BNP No results found for: "BNP"   Imaging:  No results found.  botulinum toxin Type A (BOTOX) injection 200 Units     Date Action Dose Route User   01/29/2022 1320 Given by Other 155 Units Intramuscular (Other) Wilder Glade, LPN           No data to display          No results found for: "NITRICOXIDE"      Assessment & Plan:   Snoring She has snoring, excessive daytime sleepiness, chronic headaches, frequent night awakenings. Epworth 20. Given this,  I am concerned she could have sleep disordered breathing with obstructive sleep apnea. She will need sleep study for further evaluation.    - discussed how weight can impact sleep and risk for sleep disordered breathing - discussed options to assist with weight loss: combination of diet modification, cardiovascular and strength training exercises   - had an extensive discussion regarding the adverse health consequences related to untreated sleep disordered breathing - specifically discussed the risks for hypertension, coronary artery disease, cardiac dysrhythmias, cerebrovascular disease, and diabetes - lifestyle modification discussed   - discussed  how sleep disruption can increase risk of accidents, particularly when driving - safe driving practices were discussed  Patient Instructions  Given your symptoms, I am concerned that you could have sleep disordered breathing with sleep apnea. You will need a sleep study for further evaluation. Someone from our office will contact you to schedule this.  We discussed how untreated sleep apnea puts an individual at risk for cardiac arrhthymias, pulm HTN, DM, stroke and increases their risk for daytime accidents. We also briefly reviewed treatment options including weight loss, side sleeping position, oral appliance, CPAP therapy or referral to ENT for possible surgical options  Follow up in 6-8 weeks with Katie Jarvin Ogren,NP or sooner as needed     Excessive daytime sleepiness See above.  INSOMNIA-SLEEP DISORDER-UNSPEC Possibly multifactorial but could be related to untreated OSA. She struggles with both sleep onset and sleep maintenance. See above plan. If no evidence of sleep disordered breathing, we can refer her to CBT or PCP to manage.    I spent 38 minutes of dedicated to the care of this patient on the date of this encounter to include pre-visit review of records, face-to-face time with the patient discussing conditions above, post visit ordering of testing, clinical documentation with the electronic health record, making appropriate referrals as documented, and communicating necessary findings to members of the patients care team.  Clayton Bibles, NP 02/24/2022  Pt aware and understands NP's role.

## 2022-02-24 NOTE — Assessment & Plan Note (Signed)
She has snoring, excessive daytime sleepiness, chronic headaches, frequent night awakenings. Epworth 20. Given this,  I am concerned she could have sleep disordered breathing with obstructive sleep apnea. She will need sleep study for further evaluation.    - discussed how weight can impact sleep and risk for sleep disordered breathing - discussed options to assist with weight loss: combination of diet modification, cardiovascular and strength training exercises   - had an extensive discussion regarding the adverse health consequences related to untreated sleep disordered breathing - specifically discussed the risks for hypertension, coronary artery disease, cardiac dysrhythmias, cerebrovascular disease, and diabetes - lifestyle modification discussed   - discussed how sleep disruption can increase risk of accidents, particularly when driving - safe driving practices were discussed  Patient Instructions  Given your symptoms, I am concerned that you could have sleep disordered breathing with sleep apnea. You will need a sleep study for further evaluation. Someone from our office will contact you to schedule this.  We discussed how untreated sleep apnea puts an individual at risk for cardiac arrhthymias, pulm HTN, DM, stroke and increases their risk for daytime accidents. We also briefly reviewed treatment options including weight loss, side sleeping position, oral appliance, CPAP therapy or referral to ENT for possible surgical options  Follow up in 6-8 weeks with Katie Gevin Perea,NP or sooner as needed

## 2022-02-24 NOTE — Patient Instructions (Signed)
Given your symptoms, I am concerned that you could have sleep disordered breathing with sleep apnea. You will need a sleep study for further evaluation. Someone from our office will contact you to schedule this.  We discussed how untreated sleep apnea puts an individual at risk for cardiac arrhthymias, pulm HTN, DM, stroke and increases their risk for daytime accidents. We also briefly reviewed treatment options including weight loss, side sleeping position, oral appliance, CPAP therapy or referral to ENT for possible surgical options  Follow up in 6-8 weeks with Rachel Juana Montini,NP or sooner as needed

## 2022-02-24 NOTE — Assessment & Plan Note (Signed)
See above

## 2022-02-24 NOTE — Assessment & Plan Note (Signed)
Possibly multifactorial but could be related to untreated OSA. She struggles with both sleep onset and sleep maintenance. See above plan. If no evidence of sleep disordered breathing, we can refer her to CBT or PCP to manage.

## 2022-03-01 ENCOUNTER — Telehealth: Payer: Self-pay | Admitting: Internal Medicine

## 2022-03-01 ENCOUNTER — Other Ambulatory Visit: Payer: Self-pay | Admitting: Internal Medicine

## 2022-03-01 ENCOUNTER — Other Ambulatory Visit (HOSPITAL_COMMUNITY): Payer: Self-pay

## 2022-03-01 MED ORDER — ESOMEPRAZOLE MAGNESIUM 40 MG PO CPDR
40.0000 mg | DELAYED_RELEASE_CAPSULE | Freq: Every day | ORAL | 3 refills | Status: DC
  Filled 2022-03-01: qty 90, 90d supply, fill #0

## 2022-03-01 NOTE — Telephone Encounter (Signed)
Patient needs refill on her Dexilant 60 mg. - please send to [facility]

## 2022-03-02 ENCOUNTER — Other Ambulatory Visit (HOSPITAL_COMMUNITY): Payer: Self-pay

## 2022-03-02 NOTE — Telephone Encounter (Signed)
I had to change this I tihnk to nexium as the pharmacist said it very very very expensive

## 2022-03-02 NOTE — Telephone Encounter (Signed)
Patient updated and expresses understanding

## 2022-03-02 NOTE — Telephone Encounter (Signed)
Spoke with patient to get the specifics of her request, I don't see where this particular medication was ever prescribed by you or another provider. Patient stated that her insurance will not cover it and is requesting an alternative to it. Please advise.

## 2022-03-03 ENCOUNTER — Other Ambulatory Visit (HOSPITAL_COMMUNITY): Payer: Self-pay

## 2022-03-03 ENCOUNTER — Telehealth: Payer: Self-pay | Admitting: Internal Medicine

## 2022-03-03 DIAGNOSIS — E559 Vitamin D deficiency, unspecified: Secondary | ICD-10-CM

## 2022-03-03 DIAGNOSIS — E1165 Type 2 diabetes mellitus with hyperglycemia: Secondary | ICD-10-CM

## 2022-03-03 DIAGNOSIS — E538 Deficiency of other specified B group vitamins: Secondary | ICD-10-CM

## 2022-03-03 NOTE — Telephone Encounter (Signed)
Pt has 6 month f/u with PCP 11/1 and has scheduled a lab appt for 10/25.  Please enter lab orders.

## 2022-03-03 NOTE — Telephone Encounter (Signed)
Ok labs ordered 

## 2022-03-09 ENCOUNTER — Other Ambulatory Visit (HOSPITAL_COMMUNITY): Payer: Self-pay

## 2022-03-09 ENCOUNTER — Encounter: Payer: Self-pay | Admitting: Internal Medicine

## 2022-03-09 ENCOUNTER — Ambulatory Visit: Payer: Medicaid Other | Admitting: Internal Medicine

## 2022-03-09 VITALS — BP 128/72 | HR 103 | Ht 61.0 in | Wt 142.6 lb

## 2022-03-09 DIAGNOSIS — E1165 Type 2 diabetes mellitus with hyperglycemia: Secondary | ICD-10-CM | POA: Diagnosis not present

## 2022-03-09 DIAGNOSIS — Z794 Long term (current) use of insulin: Secondary | ICD-10-CM | POA: Diagnosis not present

## 2022-03-09 DIAGNOSIS — E663 Overweight: Secondary | ICD-10-CM | POA: Diagnosis not present

## 2022-03-09 DIAGNOSIS — E782 Mixed hyperlipidemia: Secondary | ICD-10-CM

## 2022-03-09 DIAGNOSIS — G63 Polyneuropathy in diseases classified elsewhere: Secondary | ICD-10-CM

## 2022-03-09 LAB — POCT GLYCOSYLATED HEMOGLOBIN (HGB A1C): Hemoglobin A1C: 7.2 % — AB (ref 4.0–5.6)

## 2022-03-09 MED ORDER — INSULIN GLARGINE-YFGN 100 UNIT/ML ~~LOC~~ SOPN
30.0000 [IU] | PEN_INJECTOR | Freq: Every day | SUBCUTANEOUS | 3 refills | Status: DC
  Filled 2022-03-09: qty 30, 100d supply, fill #0
  Filled 2022-03-23 (×2): qty 27, 90d supply, fill #0
  Filled 2022-10-26: qty 27, 90d supply, fill #1

## 2022-03-09 MED ORDER — OZEMPIC (0.25 OR 0.5 MG/DOSE) 2 MG/3ML ~~LOC~~ SOPN
0.5000 mg | PEN_INJECTOR | SUBCUTANEOUS | 3 refills | Status: DC
  Filled 2022-03-09 – 2022-03-18 (×2): qty 9, 84d supply, fill #0
  Filled 2022-03-23: qty 3, 28d supply, fill #0
  Filled 2022-04-28 – 2022-05-11 (×3): qty 3, 28d supply, fill #1
  Filled 2022-06-05 – 2022-07-30 (×2): qty 3, 28d supply, fill #2
  Filled 2022-08-28 – 2022-09-01 (×3): qty 3, 28d supply, fill #3
  Filled 2022-09-25: qty 3, 28d supply, fill #4
  Filled 2022-10-25 – 2022-10-26 (×3): qty 3, 28d supply, fill #5

## 2022-03-09 NOTE — Patient Instructions (Signed)
Please continue: - Lantus 24 units in a.m. - Ozempic 0.5 mg weekly in a.m.  Try using: - Alpha Lipoic Acid 600 mg 2x day  Please return in 4 months with your sugar log.

## 2022-03-09 NOTE — Progress Notes (Signed)
Subjective:     Patient ID: Rachel [name], female   DOB: [birth year], 56 y.o.   MRN: [id number]  HPI Ms. Rachel [name] is a  56 y.o. woman, returning for f/u for DM2, dx 1987, uncontrolled, insulin-dependent, with probable complications (? peripheral neuropathy, ?  Gastroparesis).  Last visit 4 months ago.  Interim history: No increased urination, blurry vision, chest pain. She has occasional nausea - Zofran helps. Last episode 2 weeks ago - associated with constipation. Constipation improved on Linzess.    Reviewed HbA1c levels: Lab Results  Component Value Date   HGBA1C 7.2 (A) 03/09/2022   HGBA1C 7.2 (A) 11/06/2021   HGBA1C 6.8 (A) 08/04/2021   HGBA1C 6.7 (A) 03/30/2021   HGBA1C 8.3 (H) 12/30/2020   HGBA1C 6.6 (A) 09/16/2020   HGBA1C 7.8 (A) 06/17/2020   HGBA1C 10.0 (H) 08/29/2019   HGBA1C 11.1 (A) 07/02/2019   HGBA1C 10.2 (H) 11/14/2018   HGBA1C 11.2 (H) 08/23/2018   HGBA1C 8.6 (A) 04/26/2018   HGBA1C 0 04/26/2018   HGBA1C 0 (A) 04/26/2018   HGBA1C 0.0 04/26/2018   HGBA1C 12.7 (A) 11/23/2017   HGBA1C 9.2 02/17/2017   HGBA1C 9.2 11/11/2016   HGBA1C 11.5 (H) 06/25/2016   HGBA1C 10.9 04/29/2015   HGBA1C 10.8 (H) 01/09/2015   HGBA1C 12.7 (H) 06/24/2014   HGBA1C 15.1 (H) 10/04/2013   HGBA1C 8.4 (H) 01/11/2013   HGBA1C 8.6 (H) 10/02/2012   HGBA1C 10.1 (H) 06/13/2012   HGBA1C 9.9 (H) 01/25/2012   HGBA1C 11.0 (H) 06/18/2011   HGBA1C 9.8 (H) 01/15/2011   HGBA1C 10.0 (H) 10/13/2010  02/17/2017: HbA1c calculated from fructosamine is better, at 8.34%, but still high. Prev. 10.1%.  Reviewed history: She came off all DM medicines for 6 mo before the HbA1c in 09/2013. She again ran out of all meds in 04/2016.  She is  on: - Lantus 40 >> 34 >> 36 >> 32 >> 28 >> Semglee 24 >> 30 units in a.m.- off for 3 weeks 1 mo ago when she went to [place] and forgot the insulin at home - Ozempic 0.5 mg weekly - added 06/2019 >> 0.25 >> 0.5 mg weekly  Stopped Januvia 100 mg in am >> then Ozempic 0.5 mg  weekly She is off Metformin XR 1000 mg 2x a day with b'fast and dinner >> upset stomach >> stopped 16/3846 We triedTrulicity 1.5 mg weekly >> worked great but had to stop b/c GERD >> stopped. We tried Jardiance 25 mg daily >> started 08/2016 >> but stopped recently 2/2 recurrent UTIs She was on Glipizide 5 mg bid - added 04/2015 >> was not taking it b/c low CBGs. We stopped Glipizide XL 5 mg in 07/2014. She had episodes of hypoglycemia with Amaryl in the past. She was on Bydureon 2 mg weekly (had nausea). Previously on Humalog, then NovoLog -started 12/2020  She checks her sugars 1-2 times a day: - am:  96-138, 160 (steroid inj) >> 103-126, 154 >> 160 w/o insulin; 100-130 w/ insulin - after b'fast: 158-251 >> n/c  - before lunch: 180-200 >> n/c >> 60 x1 >> n/c >> 75 (fasting, w/o insulin) >> 99 - after lunch: up to 200s >> n/c - before dinner:  180-200 >> 137, 150 >> 140-150  >> n/c - after dinner: 166-245 >> n/c - bedtime: n/c >> 300 >> 100-125 (has snack) >> n/c >> 130s >> 145-150s She has hypoglycemia awareness in the 90s. Lowest: 38 (while on Trulicity) >> ... 49 (05/2020) at night - lost  consciousness x2 >> 88 >> 96 >> 75. Highest 600 >> .Marland Kitchen.  150 >> HI >> 210 (Chinese food) >> 160 >> 154 (Mother's day) >> 150s.  She saw nutrition in the past.  -+ HL. Last lipids: Lab Results  Component Value Date   CHOL 241 (H) 10/12/2021   HDL 58.10 10/12/2021   LDLCALC 153 (H) 10/12/2021   LDLDIRECT 191.0 08/23/2018   TRIG 148.0 10/12/2021   CHOLHDL 4 10/12/2021  On Crestor 40 - restarted after  the above labs returned.  -No CKD: Lab Results  Component Value Date   BUN 9 10/12/2021   Lab Results  Component Value Date   CREATININE 0.68 10/12/2021   -+ Numbness and tingling in the right leg.  Last foot exam 11/06/2021.  - Latest eye appointment was in 06/2021: No DR   She had a gastric emptying study (02/03/2021) showed gastroparesis (but checked on Ozempic!). Started on Reglan, but  not using it consistently. 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  Review of Systems + see HPI  I reviewed pt's medications, allergies, PMH, social hx, family hx, and changes were documented in the history of present illness. Otherwise, unchanged from my initial visit note.  Past Medical History:  Diagnosis Date   ALLERGIC RHINITIS 10/04/2007   Anemia 01/21/2011   Anxiety 11/15/2018   ASTHMA 08/02/2007   INHALERS ONLY IN [place]   Asthma 08/02/2007   Qualifier: Diagnosis of  By: Wynona Luna    Blood transfusion without reported diagnosis    with first child    Cervical radiculopathy 01/21/2011   Cervicogenic headache 04/27/2017   COMMON MIGRAINE 05/15/2009   Depression 01/09/2010   Qualifier: Diagnosis of  By: Jenny Reichmann MD, [place], TYPE II 08/02/2007   Dysphagia, pharyngoesophageal phase 06/12/2014   GERD 08/02/2007   HYPERLIPIDEMIA 08/02/2007   Hyperlipidemia 08/02/2007   Qualifier: Diagnosis of  By: Wynona Luna    INSOMNIA-SLEEP DISORDER-UNSPEC 01/04/2008   INTERMITTENT VERTIGO 05/15/2009   LIBIDO, DECREASED 01/09/2010   Migraine without aura 05/15/2009   Qualifier: Diagnosis of  By: Jenny Reichmann MD, [place] Oris    Nonallopathic lesion of rib cage 12/21/2017   Nonallopathic lesion of sacral region 07/29/2015   Nonallopathic lesion of thoracic region 07/29/2015   Osteoporosis 04/25/2019   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   Rheumatoid factor positive 02/10/2016   Right knee pain 01/31/2019   Injected January 31, 2019   Type 2 diabetes mellitus with hyperglycemia, with long-term current use of insulin ([place]) 08/02/2007   Qualifier: Diagnosis of  By: Wynona Luna    Past Surgical History:  Procedure Laterality Date   CESAREAN SECTION     x 3   CYST EXCISION     left knee   PAROTIDECTOMY  10/21/2011   Procedure: PAROTIDECTOMY;  Surgeon: Melida Quitter, MD;  Location: [facility] OR;  Service: ENT;  Laterality: Left;   Social History   Socioeconomic History    Marital status: Married    Spouse name: [name]   Number of children: 3   Years of education: Degree   Highest education level: Not on file  Occupational History   Occupation: Chartered certified accountant    Employer: [employer]   Occupation: [employer]    Employer: [employer]  Tobacco Use   Smoking status: Never   Smokeless tobacco: Never  Vaping Use   Vaping Use: Never used  Substance and Sexual  Activity   Alcohol use: No    Alcohol/week: 0.0 standard drinks of alcohol   Drug use: No   Sexual activity: Not on file  Other Topics Concern   Not on file  Social History Narrative   She has 3 daughters.   Patient has a 4 year degree.    Patient working at [employer].    Patient is married to Sunray.   Social Determinants of Health   Financial Resource Strain: Not on file  Food Insecurity: Not on file  Transportation Needs: Not on file  Physical Activity: Not on file  Stress: Not on file  Social Connections: Not on file  Intimate Partner Violence: Not on file   Current Outpatient Medications on File Prior to Visit  Medication Sig Dispense Refill   aspirin 81 MG EC tablet Take 81 mg by mouth daily.     Blood Glucose Monitoring Suppl (BLOOD GLUCOSE MONITOR SYSTEM) w/Device KIT Use as instructed to check blood sugar 2 times daily 1 kit 0   Botulinum Toxin Type A (BOTOX) 200 units SOLR Inject 155 units IM Into multiple sites in the face,neck,and head every 90 days 1 each 4   butalbital-acetaminophen-caffeine (FIORICET) 50-325-40 MG tablet Take 1 tablet by mouth every 6 (six) hours as needed for headache. Max 2 tablets per day 30 tablet 3   Cholecalciferol (VITAMIN D) 50 MCG (2000 UT) CAPS Take 4,000 Units by mouth daily.     Diclofenac Sodium (PENNSAID) 2 % SOLN Place 2 g onto the skin 2 (two) times daily. 112 g 3   DULoxetine (CYMBALTA) 60 MG capsule Take 1 capsule (60 mg total) by mouth daily. 90 capsule 3   eletriptan (RELPAX) 40 MG tablet Take 1 tablet (40 mg total) by mouth as  needed for migraine or headache (May repeat after 2 hours.  Maximum 2 tablets in 24 hours). May repeat in 2 hours if headache persists or recurs. 10 tablet 5   esomeprazole (NEXIUM) 40 MG capsule Take 1 capsule (40 mg total) by mouth daily. 90 capsule 3   gabapentin (NEURONTIN) 300 MG capsule Take 2 capsules (600 mg total) by mouth 2 (two) times daily. 120 capsule 5   glucose blood (CONTOUR NEXT TEST) test strip Use as instructed to check blood sugar 2 times daily 200 each 3   insulin glargine-yfgn (SEMGLEE) 100 UNIT/ML Pen Inject 28 Units into the skin daily. 30 mL 0   linaclotide (LINZESS) 145 MCG CAPS capsule Take 1 capsule (145 mcg total) by mouth daily before breakfast 30 capsule 2   metoCLOPramide (REGLAN) 5 MG tablet Take 1 tablet (5 mg total) by mouth 3 (three) times daily 30 minutes before meals 90 tablet 1   Multiple Vitamin (MULTIVITAMIN WITH MINERALS) TABS tablet Take 1 tablet by mouth daily.     ondansetron (ZOFRAN ODT) 8 MG disintegrating tablet Take 1 tablet (8 mg total) by mouth every 8 (eight) hours as needed for nausea or vomiting. 30 tablet 0   ondansetron (ZOFRAN) 4 MG tablet Take 1 tablet (4 mg total) by mouth every 8 (eight) hours as needed for nausea & vomiting 40 tablet 1   propranolol (INDERAL) 20 MG tablet Take 1.5 tablets (30 mg total) by mouth 2 (two) times daily. 90 tablet 5   Semaglutide,0.25 or 0.5MG /DOS, (OZEMPIC, 0.25 OR 0.5 MG/DOSE,) 2 MG/3ML SOPN Inject 0.5 mg into the skin once a week. 9 mL 2   tiZANidine (ZANAFLEX) 4 MG tablet Take 1 tablet (4 mg total) by  mouth every 8 (eight) hours as needed for muscle spasms. 90 tablet 5   triamcinolone (NASACORT) 55 MCG/ACT AERO nasal inhaler Place 2 sprays into the nose daily. 1 Inhaler 12   No current facility-administered medications on file prior to visit.   Allergies  Allergen Reactions   Lipitor [Atorvastatin]     REACTION: myalgias   Nitroglycerin    Family History  Problem Relation Age of Onset   Hypertension  Father    Diabetes Father    Asthma Father    Cervical cancer Maternal Aunt    Heart disease Maternal Aunt    Anesthesia problems Neg Hx    Colon cancer Neg Hx    Breast cancer Neg Hx    Esophageal cancer Neg Hx    Stomach cancer Neg Hx     Objective:   Physical Exam BP 128/72 (BP Location: Right Arm, Patient Position: Sitting, Cuff Size: Normal)   Pulse (!) 103   Ht $R'5\' 1"'xf$  (1.549 m)   Wt 142 lb 9.6 oz (64.7 kg)   SpO2 98%   BMI 26.94 kg/m   Wt Readings from Last 3 Encounters:  03/09/22 142 lb 9.6 oz (64.7 kg)  02/24/22 143 lb 6.4 oz (65 kg)  02/08/22 143 lb (64.9 kg)   Constitutional: normal weight, in NAD Eyes:  EOMI, no exophthalmos ENT: no neck masses, no cervical lymphadenopathy Cardiovascular: Tachycardia, RR, No MRG Respiratory: CTA B Musculoskeletal: no deformities Skin:no rashes Neurological: no tremor with outstretched hands  Assessment:     1. DM2, uncontrolled, insulin-dependent, with probable complications - ? peripheral neuropathy  2. HL  3.  Overweight  4. PN  Plan:     1. Patient with history of type 2 diabetes, uncontrolled, on basal insulin and weekly GLP-1 receptor agonist, with improved control in the last year.  At last visit, HbA1c was higher than expected from her blood sugars at home, 7.2%.  She was mostly checking blood sugars in the morning and I advised her to also check later in the day.  When she was checking at night (rarely), they were well into the normal range.  We ended up decreasing her Lantus dose at that time she was describing feeling very low when blood sugars were dropping to the 70s -Has a history of nausea, vomiting, and bloating initially on Ozempic but these resolved.  She had a gastric emptying study that showed delay gastric emptying, but this was checked while she was on Ozempic.  She does have constipation, for which she takes Linzess.  This is improved. -At today's visit, she tells me that she had to increase the dose  of insulin as sugars were higher after we decrease the dose at last visit.  Also, she had to switch from Lantus to [facility].  She was off her insulin for 3 weeks when she traveled, now back on it for the last 2 weeks.  Sugars are at goal, but they were higher when she was off her insulin.  Patient probably why her A1c has not improved (see below).  For now, I advised her to continue the current regimen.  I refilled both of her prescriptions. - I advised her to: Patient Instructions  Please continue: - Lantus 24 units in a.m. - Ozempic 0.5 mg weekly in a.m.  Try using: - Alpha Lipoic Acid 600 mg 2x day  Please return in 4 months with your sugar log.   - we checked her HbA1c: 7.2% (stable) - advised to check sugars  at different times of the day - 1x a day, rotating check times - advised for yearly eye exams >> she is UTD - return to clinic in 4 months  2. HL -Reviewing her lipid panel from 10/2021, her LDL was very high but she was out of Crestor at that time. Lab Results  Component Value Date   CHOL 241 (H) 10/12/2021   HDL 58.10 10/12/2021   LDLCALC 153 (H) 10/12/2021   LDLDIRECT 191.0 08/23/2018   TRIG 148.0 10/12/2021   CHOLHDL 4 10/12/2021  -She is back on Crestor 40 mg daily without side effects  3.  Overweight -We will continue Ozempic which should also help with weight loss -Lost 2 pounds before last visit but gained 1 pound since then  4. PN -B12 level was normal: Lab Results  Component Value Date   VITAMINB12 353 08/04/2021  -I again recommended alpha-lipoic acid 600 mg twice a day  Philemon Kingdom, MD PhD [facility] Endocr[facility]

## 2022-03-11 NOTE — Progress Notes (Signed)
[place] Phone: [phone] Subjective:   Rachel [name], am serving as a scribe for Dr. Hulan Saas.  I'm seeing this patient by the request  of:  Biagio Borg, MD  CC: Back and neck pain follow-up  [id number]  Rachel [name] is a 56 y.o. female coming in with complaint of back and neck pain. OMT 02/08/2022. Patient states that everything is hurting today. Pain in shoulders and neck along with knees. Pain when going down the stairs. R knee worse than L.   Medications patient has been prescribed: None  Taking:         Reviewed prior external information including notes and imaging from previsou exam, outside providers and external EMR if available.   As well as notes that were available from care everywhere and other healthcare systems.  Past medical history, social, surgical and family history all reviewed in electronic medical record.  [name] pertanent information unless stated regarding to the chief complaint.   Past Medical History:  Diagnosis Date   ALLERGIC RHINITIS 10/04/2007   Anemia 01/21/2011   Anxiety 11/15/2018   ASTHMA 08/02/2007   INHALERS ONLY IN [place]   Asthma 08/02/2007   Qualifier: Diagnosis of  By: Wynona Luna    Blood transfusion without reported diagnosis    with first child    Cervical radiculopathy 01/21/2011   Cervicogenic headache 04/27/2017   COMMON MIGRAINE 05/15/2009   Depression 01/09/2010   Qualifier: Diagnosis of  By: Jenny Reichmann MD, [place], TYPE II 08/02/2007   Dysphagia, pharyngoesophageal phase 06/12/2014   GERD 08/02/2007   HYPERLIPIDEMIA 08/02/2007   Hyperlipidemia 08/02/2007   Qualifier: Diagnosis of  By: Wynona Luna    INSOMNIA-SLEEP [place] 01/04/2008   INTERMITTENT VERTIGO 05/15/2009   LIBIDO, DECREASED 01/09/2010   Migraine without aura 05/15/2009   Qualifier: Diagnosis of  By: Jenny Reichmann MD, [place] Oris    Nonallopathic lesion of rib  cage 12/21/2017   Nonallopathic lesion of sacral region 07/29/2015   Nonallopathic lesion of thoracic region 07/29/2015   Osteoporosis 04/25/2019   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   Rheumatoid factor positive 02/10/2016   Right knee pain 01/31/2019   Injected January 31, 2019   Type 2 diabetes mellitus with hyperglycemia, with long-term current use of insulin ([place]) 08/02/2007   Qualifier: Diagnosis of  By: Wynona Luna     Allergies  Allergen Reactions   Lipitor [Atorvastatin]     REACTION: myalgias   Nitroglycerin      Review of Systems:  [name] headache, visual changes, nausea, vomiting, diarrhea, constipation, dizziness, abdominal pain, skin rash, fevers, chills, night sweats, weight loss, swollen lymph nodes, body aches, joint swelling, chest pain, shortness of breath, mood changes. POSITIVE muscle aches  Objective  Blood pressure 102/80, pulse 93, height '5\' 1"'$  (1.549 m), weight 143 lb (64.9 kg), SpO2 98 %.   General: [name] apparent distress alert and oriented x3 mood and affect normal, dressed appropriately.  HEENT: Pupils equal, extraocular movements intact  Respiratory: Patient's speak in full sentences and does not appear short of breath  Cardiovascular: [name] lower extremity edema, non tender, [name] erythema  MSK:  Back does have some loss of lordosis.  Poor core strength noted.  Tenderness to palpation over the paraspinal musculature.  Patient does have tightness noted in the FABER test bilaterally right greater than left. Knee exams do have some crepitus  noted.  Some tenderness to palpation of the knees diffusely medially and laterally.  After informed written and verbal consent, patient was seated on exam table. Right knee was prepped with alcohol swab and utilizing anterolateral approach, patient's right knee space was injected with 4:1  marcaine 0.5%: Kenalog '40mg'$ /dL. Patient tolerated the procedure well without immediate complications.  After informed written and  verbal consent, patient was seated on exam table. Left knee was prepped with alcohol swab and utilizing anterolateral approach, patient's left knee space was injected with 4:1  marcaine 0.5%: Kenalog '40mg'$ /dL. Patient tolerated the procedure well without immediate complications.  Osteopathic findings  C3 flexed rotated and side bent right C6 flexed rotated and side bent left T3 extended rotated and side bent right inhaled rib T6 extended rotated and side bent left L2 flexed rotated and side bent right Sacrum right on right       Assessment and Plan:  Cervicogenic headache Discussed home exercises, icing regimen, which activities to do which ones to avoid.  Discussed the osteopathic manipulation.  Discussed home exercises.  Follow-up again in 6 to 8 weeks otherwise.    Nonallopathic problems  Decision today to treat with OMT was based on Physical Exam  After verbal consent patient was treated with HVLA, ME, FPR techniques in cervical, rib, thoracic, lumbar, and sacral  areas  Patient tolerated the procedure well with improvement in symptoms  Patient given exercises, stretches and lifestyle modifications  See medications in patient instructions if given  Patient will follow up in 4-8 weeks    The above documentation has been reviewed and is accurate and complete Lyndal Pulley, DO          Note: This dictation was prepared with Dragon dictation along with smaller phrase technology. Any transcriptional errors that result from this process are unintentional.

## 2022-03-12 ENCOUNTER — Other Ambulatory Visit (HOSPITAL_COMMUNITY): Payer: Self-pay

## 2022-03-16 ENCOUNTER — Ambulatory Visit (INDEPENDENT_AMBULATORY_CARE_PROVIDER_SITE_OTHER): Payer: Medicaid Other | Admitting: Family Medicine

## 2022-03-16 ENCOUNTER — Other Ambulatory Visit (HOSPITAL_COMMUNITY): Payer: Self-pay

## 2022-03-16 ENCOUNTER — Encounter: Payer: Self-pay | Admitting: Family Medicine

## 2022-03-16 VITALS — BP 102/80 | HR 93 | Ht 61.0 in | Wt 143.0 lb

## 2022-03-16 DIAGNOSIS — M9908 Segmental and somatic dysfunction of rib cage: Secondary | ICD-10-CM | POA: Diagnosis not present

## 2022-03-16 DIAGNOSIS — M9903 Segmental and somatic dysfunction of lumbar region: Secondary | ICD-10-CM

## 2022-03-16 DIAGNOSIS — G4486 Cervicogenic headache: Secondary | ICD-10-CM | POA: Diagnosis not present

## 2022-03-16 DIAGNOSIS — M170 Bilateral primary osteoarthritis of knee: Secondary | ICD-10-CM | POA: Diagnosis not present

## 2022-03-16 DIAGNOSIS — M9902 Segmental and somatic dysfunction of thoracic region: Secondary | ICD-10-CM

## 2022-03-16 DIAGNOSIS — M9901 Segmental and somatic dysfunction of cervical region: Secondary | ICD-10-CM | POA: Diagnosis not present

## 2022-03-16 DIAGNOSIS — M9904 Segmental and somatic dysfunction of sacral region: Secondary | ICD-10-CM

## 2022-03-16 NOTE — Assessment & Plan Note (Signed)
Discussed with patient about icing regimen and home exercises.  Discussed posture and ergonomics.  Discussed weight loss.  Patient did respond relatively well to the viscosupplementation and can repeat after November 8.  Patient will return at that time and will consider it.

## 2022-03-16 NOTE — Assessment & Plan Note (Signed)
Discussed home exercises, icing regimen, which activities to do which ones to avoid.  Discussed the osteopathic manipulation.  Discussed home exercises.  Follow-up again in 6 to 8 weeks otherwise.

## 2022-03-16 NOTE — Patient Instructions (Signed)
Injected knees today Will get approval for gel Any time after Nov 8th schedule follow-up

## 2022-03-17 ENCOUNTER — Telehealth: Payer: Self-pay

## 2022-03-17 ENCOUNTER — Other Ambulatory Visit (HOSPITAL_COMMUNITY): Payer: Self-pay

## 2022-03-17 NOTE — Telephone Encounter (Signed)
Patient Advocate Encounter   Received notification from [facility] Medicaid that prior authorization is required for Ozempic (0.25 or 0.5 MG/DOSE) '2MG'$ /3ML pen-injectors  PA submitted and APPROVED on 03/17/22.  Key [id number] Effective: 03/17/22 - 03/17/23

## 2022-03-18 ENCOUNTER — Other Ambulatory Visit (HOSPITAL_COMMUNITY): Payer: Self-pay

## 2022-03-23 ENCOUNTER — Other Ambulatory Visit: Payer: Self-pay | Admitting: Internal Medicine

## 2022-03-23 ENCOUNTER — Other Ambulatory Visit (HOSPITAL_COMMUNITY): Payer: Self-pay

## 2022-03-23 DIAGNOSIS — E1165 Type 2 diabetes mellitus with hyperglycemia: Secondary | ICD-10-CM

## 2022-03-23 MED ORDER — BLOOD GLUCOSE MONITOR SYSTEM W/DEVICE KIT
PACK | 0 refills | Status: DC
  Filled 2022-03-23: qty 1, 30d supply, fill #0

## 2022-03-24 ENCOUNTER — Other Ambulatory Visit (HOSPITAL_COMMUNITY): Payer: Self-pay

## 2022-03-25 ENCOUNTER — Other Ambulatory Visit (HOSPITAL_COMMUNITY): Payer: Self-pay

## 2022-03-25 ENCOUNTER — Other Ambulatory Visit: Payer: Self-pay

## 2022-03-25 DIAGNOSIS — E1165 Type 2 diabetes mellitus with hyperglycemia: Secondary | ICD-10-CM

## 2022-03-25 MED ORDER — ACCU-CHEK GUIDE VI STRP
ORAL_STRIP | Freq: Two times a day (BID) | 3 refills | Status: DC
  Filled 2022-03-25: qty 50, 25d supply, fill #0
  Filled 2022-04-19: qty 50, 30d supply, fill #1
  Filled 2022-04-28: qty 50, 25d supply, fill #1
  Filled 2022-10-26: qty 50, 25d supply, fill #2

## 2022-03-25 MED ORDER — ACCU-CHEK SOFTCLIX LANCETS MISC
0 refills | Status: DC
  Filled 2022-03-25: qty 1, 1d supply, fill #0
  Filled 2022-04-19: qty 102, 51d supply, fill #0
  Filled 2022-04-28: qty 100, 50d supply, fill #0

## 2022-03-25 MED ORDER — ACCU-CHEK GUIDE W/DEVICE KIT
PACK | 0 refills | Status: DC
  Filled 2022-03-25: qty 1, 30d supply, fill #0

## 2022-03-25 NOTE — Telephone Encounter (Signed)
Fax received advising Contour Meter not covered by insurance and Accu-Chek is preferred. Rx sent to pharmacy.

## 2022-03-29 ENCOUNTER — Other Ambulatory Visit (HOSPITAL_COMMUNITY): Payer: Self-pay

## 2022-04-07 ENCOUNTER — Other Ambulatory Visit (INDEPENDENT_AMBULATORY_CARE_PROVIDER_SITE_OTHER): Payer: Commercial Managed Care - HMO

## 2022-04-07 DIAGNOSIS — Z794 Long term (current) use of insulin: Secondary | ICD-10-CM

## 2022-04-07 DIAGNOSIS — E1165 Type 2 diabetes mellitus with hyperglycemia: Secondary | ICD-10-CM | POA: Diagnosis not present

## 2022-04-07 DIAGNOSIS — E538 Deficiency of other specified B group vitamins: Secondary | ICD-10-CM

## 2022-04-07 DIAGNOSIS — E559 Vitamin D deficiency, unspecified: Secondary | ICD-10-CM

## 2022-04-07 LAB — URINALYSIS, ROUTINE W REFLEX MICROSCOPIC
Bilirubin Urine: NEGATIVE
Hgb urine dipstick: NEGATIVE
Nitrite: NEGATIVE
Specific Gravity, Urine: 1.015 (ref 1.000–1.030)
Total Protein, Urine: NEGATIVE
Urine Glucose: NEGATIVE
Urobilinogen, UA: 0.2 (ref 0.0–1.0)
pH: 7 (ref 5.0–8.0)

## 2022-04-07 LAB — BASIC METABOLIC PANEL
BUN: 11 mg/dL (ref 6–23)
CO2: 28 mEq/L (ref 19–32)
Calcium: 9.5 mg/dL (ref 8.4–10.5)
Chloride: 102 mEq/L (ref 96–112)
Creatinine, Ser: 0.68 mg/dL (ref 0.40–1.20)
GFR: 96.37 mL/min (ref >60.00)
Glucose, Bld: 114 mg/dL — ABNORMAL HIGH (ref 70–99)
Potassium: 4.1 mEq/L (ref 3.5–5.1)
Sodium: 136 mEq/L (ref 135–145)

## 2022-04-07 LAB — CBC WITH DIFFERENTIAL/PLATELET
Basophils Absolute: 0 10*3/uL (ref 0.0–0.1)
Basophils Relative: 0.7 % (ref 0.0–3.0)
Eosinophils Absolute: 0.1 10*3/uL (ref 0.0–0.7)
Eosinophils Relative: 0.9 % (ref 0.0–5.0)
HCT: 33.8 % — ABNORMAL LOW (ref 36.0–46.0)
Hemoglobin: 11.1 g/dL — ABNORMAL LOW (ref 12.0–15.0)
Lymphocytes Relative: 51.3 % — ABNORMAL HIGH (ref 12.0–46.0)
Lymphs Abs: 3.8 10*3/uL (ref 0.7–4.0)
MCHC: 32.9 g/dL (ref 30.0–36.0)
MCV: 82.6 fl (ref 78.0–100.0)
Monocytes Absolute: 0.6 10*3/uL (ref 0.1–1.0)
Monocytes Relative: 8 % (ref 3.0–12.0)
Neutro Abs: 2.9 10*3/uL (ref 1.4–7.7)
Neutrophils Relative %: 39.1 % — ABNORMAL LOW (ref 43.0–77.0)
Platelets: 340 10*3/uL (ref 150.0–400.0)
RBC: 4.1 Mil/uL (ref 3.87–5.11)
RDW: 13.6 % (ref 11.5–15.5)
WBC: 7.4 10*3/uL (ref 4.0–10.5)

## 2022-04-07 LAB — HEPATIC FUNCTION PANEL
ALT: 13 U/L (ref 0–35)
AST: 16 U/L (ref 0–37)
Albumin: 4.2 g/dL (ref 3.5–5.2)
Alkaline Phosphatase: 56 U/L (ref 39–117)
Bilirubin, Direct: 0 mg/dL (ref 0.0–0.3)
Total Bilirubin: 0.3 mg/dL (ref 0.2–1.2)
Total Protein: 6.8 g/dL (ref 6.0–8.3)

## 2022-04-07 LAB — TSH: TSH: 1.23 u[IU]/mL (ref 0.35–5.50)

## 2022-04-07 LAB — LDL CHOLESTEROL, DIRECT: Direct LDL: 170 mg/dL

## 2022-04-07 LAB — VITAMIN D 25 HYDROXY (VIT D DEFICIENCY, FRACTURES): VITD: 32.56 ng/mL (ref 30.00–100.00)

## 2022-04-07 LAB — LIPID PANEL
Cholesterol: 243 mg/dL — ABNORMAL HIGH (ref 0–200)
HDL: 56.2 mg/dL (ref >39.00)
NonHDL: 186.8
Total CHOL/HDL Ratio: 4
Triglycerides: 220 mg/dL — ABNORMAL HIGH (ref 0.0–149.0)
VLDL: 44 mg/dL — ABNORMAL HIGH (ref 0.0–40.0)

## 2022-04-07 LAB — HEMOGLOBIN A1C: Hgb A1c MFr Bld: 7.7 % — ABNORMAL HIGH (ref 4.6–6.5)

## 2022-04-07 LAB — VITAMIN B12: Vitamin B-12: 327 pg/mL (ref 211–911)

## 2022-04-08 LAB — MICROALBUMIN / CREATININE URINE RATIO
Creatinine,U: 110.4 mg/dL
Microalb Creat Ratio: 1 mg/g (ref 0.0–30.0)
Microalb, Ur: 1.1 mg/dL (ref 0.0–1.9)

## 2022-04-14 ENCOUNTER — Ambulatory Visit (INDEPENDENT_AMBULATORY_CARE_PROVIDER_SITE_OTHER): Payer: Commercial Managed Care - HMO | Admitting: Internal Medicine

## 2022-04-14 ENCOUNTER — Encounter: Payer: Self-pay | Admitting: Internal Medicine

## 2022-04-14 ENCOUNTER — Other Ambulatory Visit (HOSPITAL_COMMUNITY): Payer: Self-pay

## 2022-04-14 VITALS — BP 108/62 | HR 103 | Temp 98.8°F | Wt 143.0 lb

## 2022-04-14 DIAGNOSIS — M170 Bilateral primary osteoarthritis of knee: Secondary | ICD-10-CM

## 2022-04-14 DIAGNOSIS — M810 Age-related osteoporosis without current pathological fracture: Secondary | ICD-10-CM

## 2022-04-14 DIAGNOSIS — E782 Mixed hyperlipidemia: Secondary | ICD-10-CM

## 2022-04-14 DIAGNOSIS — G43709 Chronic migraine without aura, not intractable, without status migrainosus: Secondary | ICD-10-CM

## 2022-04-14 DIAGNOSIS — E538 Deficiency of other specified B group vitamins: Secondary | ICD-10-CM

## 2022-04-14 DIAGNOSIS — Z794 Long term (current) use of insulin: Secondary | ICD-10-CM

## 2022-04-14 DIAGNOSIS — E559 Vitamin D deficiency, unspecified: Secondary | ICD-10-CM

## 2022-04-14 DIAGNOSIS — E1165 Type 2 diabetes mellitus with hyperglycemia: Secondary | ICD-10-CM

## 2022-04-14 MED ORDER — ESOMEPRAZOLE MAGNESIUM 40 MG PO CPDR
40.0000 mg | DELAYED_RELEASE_CAPSULE | Freq: Two times a day (BID) | ORAL | 3 refills | Status: DC
  Filled 2022-04-14: qty 180, 90d supply, fill #0

## 2022-04-14 MED ORDER — ROSUVASTATIN CALCIUM 20 MG PO TABS
20.0000 mg | ORAL_TABLET | Freq: Every day | ORAL | 3 refills | Status: DC
  Filled 2022-04-14: qty 90, 90d supply, fill #0
  Filled 2022-10-26: qty 90, 90d supply, fill #1

## 2022-04-14 MED ORDER — BUTALBITAL-APAP-CAFFEINE 50-325-40 MG PO TABS
1.0000 | ORAL_TABLET | Freq: Four times a day (QID) | ORAL | 3 refills | Status: DC | PRN
  Filled 2022-04-14: qty 30, 15d supply, fill #0
  Filled 2022-04-19 – 2022-05-26 (×2): qty 30, 15d supply, fill #1
  Filled 2022-08-23: qty 30, 15d supply, fill #2
  Filled 2022-09-02: qty 30, 15d supply, fill #3

## 2022-04-14 NOTE — Assessment & Plan Note (Signed)
With fallx 2 recent, no injury, but ok for handicapped parking app signed

## 2022-04-14 NOTE — Assessment & Plan Note (Signed)
Lab Results  Component Value Date   LDLCALC 153 (H) 10/12/2021   Uncontrolled, pt to restart crestor 20 mg qd

## 2022-04-14 NOTE — Patient Instructions (Addendum)
Please bring up the Nurtec 75 mg every other day for prevention; and the migraine med was refilled today  [place] for increased Nexium to twice per day  Please take all new medication as prescribed - the crestor 20 mg per day  You should hopefully be called soon about the Prolia shot start for the osteoporosis  Please continue all other medications as before, and refills have been done if requested.  Please have the pharmacy call with any other refills you may need.  Please keep your appointments with your specialists as you may have planned  Please make an Appointment to return in 6 months, or sooner if needed, also with Lab Appointment for testing done 3-5 days before at the [place] (so this is for TWO appointments - please see the scheduling desk as you leave)

## 2022-04-14 NOTE — Assessment & Plan Note (Signed)
Lab Results  Component Value Date   HGBA1C 7.7 (H) 04/07/2022   Stable, pt to continue current medical treatment semglee 30 u qd, ozempic 0.5 mg qd, and f/u endo as planned

## 2022-04-14 NOTE — Assessment & Plan Note (Signed)
Due for f/u dxa but already qualifies for prolia - will try to start given the new insurance per pt

## 2022-04-14 NOTE — Assessment & Plan Note (Signed)
Stable overall, continue fioricet, consider nurtec prophylaxis

## 2022-04-14 NOTE — Progress Notes (Signed)
Patient ID: Rachel [name], female   DOB: 12-10-1965, 56 y.o.   MRN: [id number]        Chief Complaint: follow up dm, hld, osteoporosis, chronic migraine, bilateral knee pain       HPI:  Rachel [name] is a 56 y.o. female here overall doing ok, Pt denies chest pain, increased sob or doe, wheezing, orthopnea, PND, increased LE swelling, palpitations, dizziness or syncope.   Pt denies polydipsia, polyuria, or new focal neuro s/s.   Has ongoing chronic migraine to post occiput but responds well to fioricet, and conts to follow with neurology.  Pt asking to restart crestor 20 mg as somehow this was taken off her med list.  Remains to follow with endo as well.   Also never started prolia per sport med about 2 yrs ago, has new insurance, wondering if she can start this.  Needs handicapped parking due to knees and fall x 2 recent       Wt Readings from Last 3 Encounters:  04/14/22 143 lb (64.9 kg)  03/16/22 143 lb (64.9 kg)  03/09/22 142 lb 9.6 oz (64.7 kg)   BP Readings from Last 3 Encounters:  04/14/22 108/62  03/16/22 102/80  03/09/22 128/72         Past Medical History:  Diagnosis Date   ALLERGIC RHINITIS 10/04/2007   Anemia 01/21/2011   Anxiety 11/15/2018   ASTHMA 08/02/2007   INHALERS ONLY IN [place]   Asthma 08/02/2007   Qualifier: Diagnosis of  By: Wynona Luna    Blood transfusion without reported diagnosis    with first child    Cervical radiculopathy 01/21/2011   Cervicogenic headache 04/27/2017   COMMON MIGRAINE 05/15/2009   Depression 01/09/2010   Qualifier: Diagnosis of  By: Jenny Reichmann MD, [place], TYPE II 08/02/2007   Dysphagia, pharyngoesophageal phase 06/12/2014   GERD 08/02/2007   HYPERLIPIDEMIA 08/02/2007   Hyperlipidemia 08/02/2007   Qualifier: Diagnosis of  By: Wynona Luna    INSOMNIA-SLEEP DISORDER-UNSPEC 01/04/2008   INTERMITTENT VERTIGO 05/15/2009   LIBIDO, DECREASED 01/09/2010   Migraine without aura 05/15/2009   Qualifier: Diagnosis of  By:  Jenny Reichmann MD, Hunt Oris    Nonallopathic lesion of rib cage 12/21/2017   Nonallopathic lesion of sacral region 07/29/2015   Nonallopathic lesion of thoracic region 07/29/2015   Osteoporosis 04/25/2019   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   Rheumatoid factor positive 02/10/2016   Right knee pain 01/31/2019   Injected January 31, 2019   Type 2 diabetes mellitus with hyperglycemia, with long-term current use of insulin ([place]) 08/02/2007   Qualifier: Diagnosis of  By: Wynona Luna    Past Surgical History:  Procedure Laterality Date   CESAREAN SECTION     x 3   CYST EXCISION     left knee   PAROTIDECTOMY  10/21/2011   Procedure: PAROTIDECTOMY;  Surgeon: Melida Quitter, MD;  Location: [facility];  Service: ENT;  Laterality: Left;    reports that she has never smoked. She has never used smokeless tobacco. She reports that she does not drink alcohol and does not use drugs. family history includes Asthma in her father; Cervical cancer in her maternal aunt; Diabetes in her father; Heart disease in her maternal aunt; Hypertension in her father. Allergies  Allergen Reactions   Lipitor [Atorvastatin]     REACTION: myalgias   Nitroglycerin    Current Outpatient Medications on File Prior to Visit  Medication Sig Dispense Refill   aspirin 81 MG EC tablet Take 81 mg by mouth daily.     Blood Glucose Monitoring Suppl (ACCU-CHEK GUIDE) w/Device KIT Use as instructed to check blood sugar 2 times daily 1 kit 0   Botulinum Toxin Type A (BOTOX) 200 units SOLR Inject 155 units IM Into multiple sites in the face,neck,and head every 90 days 1 each 4   Cholecalciferol (VITAMIN D) 50 MCG (2000 UT) CAPS Take 4,000 Units by mouth daily.     Diclofenac Sodium (PENNSAID) 2 % SOLN Place 2 g onto the skin 2 (two) times daily. 112 g 3   DULoxetine (CYMBALTA) 60 MG capsule Take 1 capsule (60 mg total) by mouth daily. 90 capsule 3   eletriptan (RELPAX) 40 MG tablet Take 1 tablet (40 mg total) by mouth as needed for  migraine or headache (May repeat after 2 hours.  Maximum 2 tablets in 24 hours). May repeat in 2 hours if headache persists or recurs. 10 tablet 5   gabapentin (NEURONTIN) 300 MG capsule Take 2 capsules (600 mg total) by mouth 2 (two) times daily. 120 capsule 5   glucose blood (ACCU-CHEK GUIDE) test strip Use to check blood sugar 2 (two) times daily as directed 200 each 3   insulin glargine-yfgn (SEMGLEE) 100 UNIT/ML Pen Inject 30 Units into the skin daily. 30 mL 3   Lancets Misc. (ACCU-CHEK SOFTCLIX LANCET DEV) KIT Use as instructed to check blood sugar 2 times daily 1 kit 0   linaclotide (LINZESS) 145 MCG CAPS capsule Take 1 capsule (145 mcg total) by mouth daily before breakfast 30 capsule 2   metoCLOPramide (REGLAN) 5 MG tablet Take 1 tablet (5 mg total) by mouth 3 (three) times daily 30 minutes before meals 90 tablet 1   Multiple Vitamin (MULTIVITAMIN WITH MINERALS) TABS tablet Take 1 tablet by mouth daily.     ondansetron (ZOFRAN ODT) 8 MG disintegrating tablet Take 1 tablet (8 mg total) by mouth every 8 (eight) hours as needed for nausea or vomiting. 30 tablet 0   ondansetron (ZOFRAN) 4 MG tablet Take 1 tablet (4 mg total) by mouth every 8 (eight) hours as needed for nausea & vomiting 40 tablet 1   propranolol (INDERAL) 20 MG tablet Take 1.5 tablets (30 mg total) by mouth 2 (two) times daily. 90 tablet 5   Semaglutide,0.25 or 0.5MG/DOS, (OZEMPIC, 0.25 OR 0.5 MG/DOSE,) 2 MG/3ML SOPN Inject 0.5 mg into the skin once a week. 9 mL 3   tiZANidine (ZANAFLEX) 4 MG tablet Take 1 tablet (4 mg total) by mouth every 8 (eight) hours as needed for muscle spasms. 90 tablet 5   triamcinolone (NASACORT) 55 MCG/ACT AERO nasal inhaler Place 2 sprays into the nose daily. 1 Inhaler 12   No current facility-administered medications on file prior to visit.        ROS:  All others reviewed and negative.  Objective        PE:  BP 108/62 (BP Location: Right Arm, Patient Position: Sitting, Cuff Size: Large)    Pulse (!) 103   Temp 98.8 F (37.1 C) (Oral)   Wt 143 lb (64.9 kg)   SpO2 98%   BMI 27.02 kg/m                 Constitutional: Pt appears in NAD               HENT: Head: NCAT.  Right Ear: External ear normal.                 Left Ear: External ear normal.                Eyes: . Pupils are equal, round, and reactive to light. Conjunctivae and EOM are normal               Nose: without d/c or deformity               Neck: Neck supple. Gross normal ROM               Cardiovascular: Normal rate and regular rhythm.                 Pulmonary/Chest: Effort normal and breath sounds without rales or wheezing.                Abd:  Soft, NT, ND, + BS, no organomegaly               Neurological: Pt is alert. At baseline orientation, motor grossly intact               Skin: Skin is warm. No rashes, no other new lesions, LE edema - none               Psychiatric: Pt behavior is normal without agitation   Micro: none  Cardiac tracings I have personally interpreted today:  none  Pertinent Radiological findings (summarize): none   Lab Results  Component Value Date   WBC 7.4 04/07/2022   HGB 11.1 (L) 04/07/2022   HCT 33.8 (L) 04/07/2022   PLT 340.0 04/07/2022   GLUCOSE 114 (H) 04/07/2022   CHOL 243 (H) 04/07/2022   TRIG 220.0 (H) 04/07/2022   HDL 56.20 04/07/2022   LDLDIRECT 170.0 04/07/2022   LDLCALC 153 (H) 10/12/2021   ALT 13 04/07/2022   AST 16 04/07/2022   NA 136 04/07/2022   K 4.1 04/07/2022   CL 102 04/07/2022   CREATININE 0.68 04/07/2022   BUN 11 04/07/2022   CO2 28 04/07/2022   TSH 1.23 04/07/2022   INR 1.08 07/23/2009   HGBA1C 7.7 (H) 04/07/2022   MICROALBUR 1.1 04/07/2022   Assessment/Plan:  Rachel [name] is a 56 y.o. Other or two or more races [6] female with  has a past medical history of ALLERGIC RHINITIS (10/04/2007), Anemia (01/21/2011), Anxiety (11/15/2018), ASTHMA (08/02/2007), Asthma (08/02/2007), Blood transfusion without reported diagnosis,  Cervical radiculopathy (01/21/2011), Cervicogenic headache (04/27/2017), COMMON MIGRAINE (05/15/2009), Depression (01/09/2010), DIABETES MELLITUS, TYPE II (08/02/2007), Dysphagia, pharyngoesophageal phase (06/12/2014), GERD (08/02/2007), HYPERLIPIDEMIA (08/02/2007), Hyperlipidemia (08/02/2007), INSOMNIA-SLEEP DISORDER-UNSPEC (01/04/2008), INTERMITTENT VERTIGO (05/15/2009), LIBIDO, DECREASED (01/09/2010), Migraine without aura (05/15/2009), Nonallopathic lesion of rib cage (12/21/2017), Nonallopathic lesion of sacral region (07/29/2015), Nonallopathic lesion of thoracic region (07/29/2015), Osteoporosis (04/25/2019), Patellofemoral syndrome of both knees (04/25/2019), Rheumatoid factor positive (02/10/2016), Right knee pain (01/31/2019), and Type 2 diabetes mellitus with hyperglycemia, with long-term current use of insulin ([place]) (08/02/2007).  Degenerative arthritis of knee, bilateral With fallx 2 recent, no injury, but ok for handicapped parking app signed  Chronic migraine without aura without status migrainosus, not intractable Stable overall, continue fioricet, consider nurtec prophylaxis  Type 2 diabetes mellitus with hyperglycemia, with long-term current use of insulin ([facility]) Lab Results  Component Value Date   HGBA1C 7.7 (H) 04/07/2022   Stable, pt to continue current medical treatment semglee 30 u qd, ozempic 0.5 mg qd, and f/u endo as planned  Osteoporosis Due for f/u dxa but already qualifies for prolia - will try to start given the new insurance per pt  Hyperlipidemia Lab Results  Component Value Date   LDLCALC 153 (H) 10/12/2021   Uncontrolled, pt to restart crestor 20 mg qd  Followup: Return in about 6 months (around 10/13/2022).  Cathlean Cower, MD 04/14/2022 8:22 PM [facility]

## 2022-04-15 ENCOUNTER — Telehealth: Payer: Self-pay | Admitting: Pharmacy Technician

## 2022-04-15 ENCOUNTER — Other Ambulatory Visit (HOSPITAL_COMMUNITY): Payer: Self-pay

## 2022-04-15 ENCOUNTER — Telehealth: Payer: Self-pay

## 2022-04-15 MED ORDER — ESOMEPRAZOLE MAGNESIUM 40 MG PO CPDR
40.0000 mg | DELAYED_RELEASE_CAPSULE | Freq: Every day | ORAL | 3 refills | Status: DC
  Filled 2022-04-15 – 2022-04-19 (×2): qty 90, 90d supply, fill #0

## 2022-04-15 NOTE — Telephone Encounter (Signed)
Ok this is done 

## 2022-04-15 NOTE — Telephone Encounter (Signed)
-----   Message from Jasper Loser, [place] sent at 04/14/2022  7:46 PM EDT ----- Regarding: FW: new prolia start  ----- Message ----- From: Biagio Borg, MD Sent: 04/14/2022   1:33 PM EDT To: Jasper Loser, CMA Subject: new prolia start                               Please to start process for new prolia start, thanks

## 2022-04-15 NOTE — Telephone Encounter (Signed)
Prolia verification of benefits submitted

## 2022-04-15 NOTE — Telephone Encounter (Signed)
Patient seen 04/14/22 and states that did not receive Rx for acid reflux

## 2022-04-16 ENCOUNTER — Other Ambulatory Visit (HOSPITAL_COMMUNITY): Payer: Self-pay

## 2022-04-16 NOTE — Telephone Encounter (Signed)
PT calls back in regards to this RX. States that pharmacy has still not received the RX for pantoprazole ([place]). She is requesting it be sent to the pharmacy on file.

## 2022-04-19 ENCOUNTER — Other Ambulatory Visit (HOSPITAL_COMMUNITY): Payer: Self-pay

## 2022-04-21 ENCOUNTER — Ambulatory Visit: Payer: Medicaid Other | Admitting: Nurse Practitioner

## 2022-04-22 ENCOUNTER — Other Ambulatory Visit (HOSPITAL_COMMUNITY): Payer: Self-pay

## 2022-04-22 NOTE — Progress Notes (Unsigned)
[name] [place] Phone: [phone] Subjective:   Rachel [name], am serving as a scribe for Dr. Hulan Saas.  I'm seeing this patient by the request  of:  Biagio Borg, MD  CC: Back and neck pain follow-up  [id number]  Rachel [name] is a 56 y.o. female coming in with complaint of back and neck pain. OMT 03/16/2022. Patient states here for regular OMT   Medications patient has been prescribed: None  Taking:         Reviewed prior external information including notes and imaging from previsou exam, outside providers and external EMR if available.  Reviewed patient's most recent primary care with patient's hyperlipidemia.  As well as notes that were available from care everywhere and other healthcare systems.  Past medical history, social, surgical and family history all reviewed in electronic medical record.  No pertanent information unless stated regarding to the chief complaint.   Past Medical History:  Diagnosis Date   ALLERGIC RHINITIS 10/04/2007   Anemia 01/21/2011   Anxiety 11/15/2018   ASTHMA 08/02/2007   INHALERS ONLY IN [place]   Asthma 08/02/2007   Qualifier: Diagnosis of  By: Wynona Luna    Blood transfusion without reported diagnosis    with first child    Cervical radiculopathy 01/21/2011   Cervicogenic headache 04/27/2017   COMMON MIGRAINE 05/15/2009   Depression 01/09/2010   Qualifier: Diagnosis of  By: Jenny Reichmann MD, [place], TYPE II 08/02/2007   Dysphagia, pharyngoesophageal phase 06/12/2014   GERD 08/02/2007   HYPERLIPIDEMIA 08/02/2007   Hyperlipidemia 08/02/2007   Qualifier: Diagnosis of  By: Wynona Luna    INSOMNIA-SLEEP [place] 01/04/2008   INTERMITTENT VERTIGO 05/15/2009   LIBIDO, DECREASED 01/09/2010   Migraine without aura 05/15/2009   Qualifier: Diagnosis of  By: Jenny Reichmann MD, Hunt Oris    Nonallopathic lesion of rib cage 12/21/2017   Nonallopathic  lesion of sacral region 07/29/2015   Nonallopathic lesion of thoracic region 07/29/2015   Osteoporosis 04/25/2019   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   Rheumatoid factor positive 02/10/2016   Right knee pain 01/31/2019   Injected January 31, 2019   Type 2 diabetes mellitus with hyperglycemia, with long-term current use of insulin ([place]) 08/02/2007   Qualifier: Diagnosis of  By: Wynona Luna     Allergies  Allergen Reactions   Lipitor [Atorvastatin]     REACTION: myalgias   Nitroglycerin      Review of Systems:  No , visual changes, nausea, vomiting, diarrhea, constipation, dizziness, abdominal pain, skin rash, fevers, chills, night sweats, weight loss, swollen lymph nodes, , joint swelling, chest pain, shortness of breath, mood changes. POSITIVE muscle aches, body aches, headache Objective  Blood pressure 120/78, pulse 95, height '5\' 1"'$  (1.549 m), weight 143 lb (64.9 kg), SpO2 98 %.   General: No apparent distress alert and oriented x3 mood and affect normal, dressed appropriately.  HEENT: Pupils equal, extraocular movements intact  Respiratory: Patient's speak in full sentences and does not appear short of breath  Cardiovascular: No lower extremity edema, non tender, no erythema  Neck exam still has tightness noted at the neck.  Severe overall.  Patient does have some voluntary guarding noted.  Lacks the last 10 degrees of extension.  Significant tightness in the paraspinal musculature in the thoracic and lumbar area as well.  Osteopathic findings  C2 flexed rotated and side bent  right C6 flexed rotated and side bent left C7 flexed rotated and side bent right T3 extended rotated and side bent right inhaled rib T9 extended rotated and side bent left L2 flexed rotated and side bent right L5 flexed rotated and side bent left Sacrum right on right       Assessment and Plan:  Cervicogenic headache Continues to have significant difficulty.  Patient still noticed  that the weather seems to be giving her more discomfort and pain as well.  We have tried multiple different medications.  Which includes patient's Cymbalta 60 mg daily as well as gabapentin 600 mg twice daily.  Continue these medications for this chronic problem but continues to seem to worsen.  Does respond short-term to osteopathic manipulation.  Would like patient to follow-up again in 6 to 8 weeks for further evaluation and treatment.    Nonallopathic problems  Decision today to treat with OMT was based on Physical Exam  After verbal consent patient was treated with HVLA, ME, FPR techniques in cervical, rib, thoracic, lumbar, and sacral  areas  Patient tolerated the procedure well with improvement in symptoms  Patient given exercises, stretches and lifestyle modifications  See medications in patient instructions if given  Patient will follow up in 4-8 weeks     The above documentation has been reviewed and is accurate and complete Lyndal Pulley, DO         Note: This dictation was prepared with Dragon dictation along with smaller phrase technology. Any transcriptional errors that result from this process are unintentional.

## 2022-04-23 NOTE — Telephone Encounter (Signed)
Nexium ordered. Sent to pharmacy on 04/15/2022

## 2022-04-26 ENCOUNTER — Other Ambulatory Visit (HOSPITAL_COMMUNITY): Payer: Self-pay

## 2022-04-26 MED ORDER — ESOMEPRAZOLE MAGNESIUM 40 MG PO CPDR
40.0000 mg | DELAYED_RELEASE_CAPSULE | Freq: Two times a day (BID) | ORAL | 3 refills | Status: DC
  Filled 2022-04-26: qty 180, 90d supply, fill #0
  Filled 2022-04-28: qty 60, 30d supply, fill #0
  Filled 2022-05-26: qty 60, 30d supply, fill #1
  Filled 2022-07-30: qty 60, 30d supply, fill #2
  Filled 2022-08-23: qty 60, 30d supply, fill #3
  Filled 2022-09-02: qty 60, 30d supply, fill #4
  Filled 2022-10-26: qty 60, 30d supply, fill #5
  Filled 2022-12-21: qty 60, 30d supply, fill #6
  Filled 2023-01-16: qty 60, 30d supply, fill #7

## 2022-04-26 NOTE — Telephone Encounter (Signed)
Ok this has been sent, thanks

## 2022-04-26 NOTE — Telephone Encounter (Signed)
Pt called to report her pharmacy has not received NEXIUM RX. The prescription is written 04/14/22, however it appears it was not stamped received by the pharmacy on the prescription. Also, the instructions still say take one by mouth daily but the office visit notes indication her instructions "[place] for increased Nexium to twice per day . "  Pt thought they discussed changing to [place] at 04/14/22 visit. Pt verbalized understanding to pick up [place] and take 2 times daily. But they still have not received the RX from Dr. Jenny Reichmann. Call pt [phone]

## 2022-04-26 NOTE — Telephone Encounter (Signed)
Prolia verification of benefits resubmitted with updated insurance info

## 2022-04-27 ENCOUNTER — Ambulatory Visit (INDEPENDENT_AMBULATORY_CARE_PROVIDER_SITE_OTHER): Payer: Commercial Managed Care - HMO | Admitting: Family Medicine

## 2022-04-27 VITALS — BP 120/78 | HR 95 | Ht 61.0 in | Wt 143.0 lb

## 2022-04-27 DIAGNOSIS — G4486 Cervicogenic headache: Secondary | ICD-10-CM

## 2022-04-27 DIAGNOSIS — M9908 Segmental and somatic dysfunction of rib cage: Secondary | ICD-10-CM | POA: Diagnosis not present

## 2022-04-27 DIAGNOSIS — M9902 Segmental and somatic dysfunction of thoracic region: Secondary | ICD-10-CM

## 2022-04-27 DIAGNOSIS — M9901 Segmental and somatic dysfunction of cervical region: Secondary | ICD-10-CM | POA: Diagnosis not present

## 2022-04-27 DIAGNOSIS — M9904 Segmental and somatic dysfunction of sacral region: Secondary | ICD-10-CM | POA: Diagnosis not present

## 2022-04-27 DIAGNOSIS — M9903 Segmental and somatic dysfunction of lumbar region: Secondary | ICD-10-CM

## 2022-04-27 NOTE — Patient Instructions (Addendum)
Good to see you  Will contact you if we get Gel approval  Follow up in 6 weeks

## 2022-04-28 ENCOUNTER — Other Ambulatory Visit (HOSPITAL_COMMUNITY): Payer: Self-pay

## 2022-04-28 ENCOUNTER — Other Ambulatory Visit: Payer: Self-pay | Admitting: Internal Medicine

## 2022-04-28 ENCOUNTER — Telehealth: Payer: Self-pay | Admitting: Pharmacy Technician

## 2022-04-28 DIAGNOSIS — Z794 Long term (current) use of insulin: Secondary | ICD-10-CM

## 2022-04-28 MED ORDER — ACCU-CHEK GUIDE W/DEVICE KIT
PACK | 0 refills | Status: AC
  Filled 2022-04-28: qty 1, fill #0

## 2022-04-28 NOTE — Telephone Encounter (Signed)
Patient had insurance change. Please initiate new Botox BIV and PA through active [employer].

## 2022-04-28 NOTE — Assessment & Plan Note (Signed)
Continues to have significant difficulty.  Patient still noticed that the weather seems to be giving her more discomfort and pain as well.  We have tried multiple different medications.  Which includes patient's Cymbalta 60 mg daily as well as gabapentin 600 mg twice daily.  Continue these medications for this chronic problem but continues to seem to worsen.  Does respond short-term to osteopathic manipulation.  Would like patient to follow-up again in 6 to 8 weeks for further evaluation and treatment.

## 2022-04-29 ENCOUNTER — Other Ambulatory Visit (HOSPITAL_COMMUNITY): Payer: Self-pay

## 2022-04-29 NOTE — Telephone Encounter (Signed)
Patient Advocate Encounter   Received notification that prior authorization for Botox 200 units is required.   PA submitted by fax to [facility] on 04/29/2022 Status is pending       Lyndel Safe, [place] Patient Advocate Specialist [place] Patient Advocate Team Direct Number: [phone]  Fax: [phone]

## 2022-04-30 ENCOUNTER — Other Ambulatory Visit (HOSPITAL_COMMUNITY): Payer: Self-pay

## 2022-04-30 ENCOUNTER — Ambulatory Visit: Payer: Medicaid Other | Admitting: Neurology

## 2022-05-03 ENCOUNTER — Other Ambulatory Visit (HOSPITAL_COMMUNITY): Payer: Self-pay

## 2022-05-04 ENCOUNTER — Other Ambulatory Visit (HOSPITAL_COMMUNITY): Payer: Self-pay

## 2022-05-10 ENCOUNTER — Telehealth: Payer: Self-pay | Admitting: Neurology

## 2022-05-10 ENCOUNTER — Other Ambulatory Visit (HOSPITAL_COMMUNITY): Payer: Self-pay

## 2022-05-10 NOTE — Telephone Encounter (Signed)
[place], Patient plan has exclusion [facility] is the speciality pharmacy that can be used.

## 2022-05-10 NOTE — Telephone Encounter (Signed)
[place] with [facility] left a mess, she would like a call back to discuss the pts insurance

## 2022-05-11 ENCOUNTER — Other Ambulatory Visit (HOSPITAL_COMMUNITY): Payer: Self-pay

## 2022-05-11 NOTE — Telephone Encounter (Signed)
Pharmacy Patient Advocate Encounter  Insurance verification completed.    The patient is insured through [place] test claims for: Prolia '60mg'$ .  Pharmacy benefit copay:  Drug is not covered by plan

## 2022-05-12 ENCOUNTER — Other Ambulatory Visit (HOSPITAL_COMMUNITY): Payer: Self-pay

## 2022-05-13 ENCOUNTER — Telehealth: Payer: Self-pay | Admitting: Neurology

## 2022-05-13 ENCOUNTER — Other Ambulatory Visit (HOSPITAL_COMMUNITY): Payer: Self-pay

## 2022-05-13 NOTE — Telephone Encounter (Signed)
Pt called in and left a message. She stated her insurance needs a PA for her botox. The current one expires today

## 2022-05-13 NOTE — Telephone Encounter (Signed)
Advised patient we have discussed this with her over a month ago before her cancelled appt 11/17, Please send [place] your new insurance card and info so we can start the PA.  Per Patient she do not have the [place] we discuss then now she has [place].   Advised patient to bring the card to the office so front desk can scan it in her chart if she can not do that please send a copy of front and back on a mychart message. So this can be done before she is outside of her window for botox.

## 2022-05-24 NOTE — Telephone Encounter (Signed)
Called [employer] to follow up on PA- rep advised it was denied. Requested fax.  Phone# [phone]

## 2022-05-26 ENCOUNTER — Other Ambulatory Visit (HOSPITAL_COMMUNITY): Payer: Self-pay

## 2022-05-27 ENCOUNTER — Other Ambulatory Visit (HOSPITAL_COMMUNITY): Payer: Self-pay

## 2022-05-27 NOTE — Telephone Encounter (Signed)
Patient has [facility], which a copy of the card was scanned in on 10/30/21. [name] is asking if we can start the PA over as she has been having terrible headaches.

## 2022-05-28 ENCOUNTER — Other Ambulatory Visit (HOSPITAL_COMMUNITY): Payer: Self-pay

## 2022-06-01 ENCOUNTER — Other Ambulatory Visit (HOSPITAL_COMMUNITY): Payer: Self-pay

## 2022-06-01 NOTE — Progress Notes (Signed)
[facility] [facility] Phone: [phone] Subjective:   [name], am serving as a scribe for Dr. Hulan Saas.  I'm seeing this patient by the request  of:  Biagio Borg, MD  CC: Back and neck pain follow-up  BSJ:[id number]  Ameliana Brashear is a 56 y.o. female coming in with complaint of back and neck pain. OMT 04/27/2022. Also f/u for B knee pain. Monovisc approved.  Patient states that she has been having more headaches and tightness in her back.   Medications patient has been prescribed:   Taking:         Reviewed prior external information including notes and imaging from previsou exam, outside providers and external EMR if available.   As well as notes that were available from care everywhere and other healthcare systems.  Past medical history, social, surgical and family history all reviewed in electronic medical record.  No pertanent information unless stated regarding to the chief complaint.   Past Medical History:  Diagnosis Date   ALLERGIC RHINITIS 10/04/2007   Anemia 01/21/2011   Anxiety 11/15/2018   ASTHMA 08/02/2007   INHALERS ONLY IN [place]   Asthma 08/02/2007   Qualifier: Diagnosis of  By: Wynona Luna    Blood transfusion without reported diagnosis    with first child    Cervical radiculopathy 01/21/2011   Cervicogenic headache 04/27/2017   COMMON MIGRAINE 05/15/2009   Depression 01/09/2010   Qualifier: Diagnosis of  By: Jenny Reichmann MD, [place], TYPE II 08/02/2007   Dysphagia, pharyngoesophageal phase 06/12/2014   GERD 08/02/2007   HYPERLIPIDEMIA 08/02/2007   Hyperlipidemia 08/02/2007   Qualifier: Diagnosis of  By: Wynona Luna    INSOMNIA-SLEEP [place] 01/04/2008   INTERMITTENT VERTIGO 05/15/2009   LIBIDO, DECREASED 01/09/2010   Migraine without aura 05/15/2009   Qualifier: Diagnosis of  By: Jenny Reichmann MD, Hunt Oris    Nonallopathic lesion of rib cage 12/21/2017    Nonallopathic lesion of sacral region 07/29/2015   Nonallopathic lesion of thoracic region 07/29/2015   Osteoporosis 04/25/2019   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   Rheumatoid factor positive 02/10/2016   Right knee pain 01/31/2019   Injected January 31, 2019   Type 2 diabetes mellitus with hyperglycemia, with long-term current use of insulin ([place]) 08/02/2007   Qualifier: Diagnosis of  By: Wynona Luna     Allergies  Allergen Reactions   Lipitor [Atorvastatin]     REACTION: myalgias   Nitroglycerin      Review of Systems:  No headache, visual changes, nausea, vomiting, diarrhea, constipation, dizziness, abdominal pain, skin rash, fevers, chills, night sweats, weight loss, swollen lymph nodes, body aches, joint swelling, chest pain, shortness of breath, mood changes. POSITIVE muscle aches  Objective  Blood pressure 112/82, pulse 91, height '5\' 1"'$  (1.549 m), weight 167 lb (75.8 kg), SpO2 98 %.   General: No apparent distress alert and oriented x3 mood and affect normal, dressed appropriately.  HEENT: Pupils equal, extraocular movements intact  Respiratory: Patient's speak in full sentences and does not appear short of breath  Cardiovascular: No lower extremity edema, non tender, no erythema  Low back and radiates with some loss lordosis.  Neck exam does have some limited sidebending bilaterally.  Worsening pain and headache with extension of the neck.  Patient does have tenderness to palpation in the paraspinal musculature of the total back.  Tightness with FABER  test.  Knees do have swelling of the patellofemoral joint bilaterally.  Mild crepitus noted.  Osteopathic findings  C2 flexed rotated and side bent right C5 flexed rotated and side bent left T3 extended rotated and side bent right inhaled rib T9 extended rotated and side bent left L3 flexed rotated and side bent right Sacrum right on right  After informed written and verbal consent, patient was seated on  exam table. Right knee was prepped with alcohol swab and utilizing anterolateral approach, patient's right knee space was injected with 48 mg per 3 mL of Monovisc (sodium hyaluronate) in a prefilled syringe was injected easily into the knee through a 22-gauge needle..Patient tolerated the procedure well without immediate complications.  After informed written and verbal consent, patient was seated on exam table. Left knee was prepped with alcohol swab and utilizing anterolateral approach, patient's left knee space was injected with 48 mg per 3 mL of Monovisc (sodium hyaluronate) in a prefilled syringe was injected easily into the knee through a 22-gauge needle..Patient tolerated the procedure well without immediate complications.     Assessment and Plan:  Cervical stenosis of spine Chronic problem with worsening symptoms.  Toradol and Depo-Medrol given today.  Discussed home exercises and icing regimen.  Discussed continuing to stay active.  Follow-up with me again in 6 to 8 weeks has Zanaflex 4 mg to take at night for breakthrough pain.  Patellofemoral syndrome of both knees Viscosupplementation given today.  Tolerated the procedure well.  Discussed the slow onset of this medication.  Discussed which activities to do and which ones to avoid.  Increase activity slowly.  Follow-up again in 6 to 8 weeks    Nonallopathic problems  Decision today to treat with OMT was based on Physical Exam  After verbal consent patient was treated with HVLA, ME, FPR techniques in cervical, rib, thoracic, lumbar, and sacral  areas  Patient tolerated the procedure well with improvement in symptoms  Patient given exercises, stretches and lifestyle modifications  See medications in patient instructions if given  Patient will follow up in 4-8 weeks    The above documentation has been reviewed and is accurate and complete Lyndal Pulley, DO          Note: This dictation was prepared with Dragon  dictation along with smaller phrase technology. Any transcriptional errors that result from this process are unintentional.

## 2022-06-01 NOTE — Telephone Encounter (Signed)
Received a fax regarding Prior Authorization from [place] for BOTOX. Authorization has been DENIED because:

## 2022-06-02 NOTE — Telephone Encounter (Signed)
Patient advised transferred to the front desk to schedule.

## 2022-06-03 ENCOUNTER — Ambulatory Visit: Payer: Medicaid Other | Admitting: Neurology

## 2022-06-03 NOTE — Progress Notes (Signed)
Virtual Visit via Video Note  Consent was obtained for video visit:  Yes.   Answered questions that patient had about telehealth interaction:  Yes.   I discussed the limitations, risks, security and privacy concerns of performing an evaluation and management service by telemedicine. I also discussed with the patient that there may be a patient responsible charge related to this service. The patient expressed understanding and agreed to proceed.  Pt location: Home Physician Location: office Name of referring provider:  Biagio Borg, MD I connected with [name] at patients initiation/request on 06/04/2022 at  8:30 AM EST by video enabled telemedicine application and verified that I am speaking with the correct person using two identifiers. Pt MRN:  [id number] Pt DOB:  1965-10-13 Video Participants:  [name];  Chronic migraine without Aura, Without Status Migrainosus, Not Intractable - tried multiple preventatives such as topiramate, Depakote, nortriptyline, venlafaxine, atenolol, Aimovig, Emgality - meets criteria for Botox.  Prior to Botox, she averaged 15 headache days a month and saw significant improvement just after the first round.   Cervicogenic component to her migraines.  Has radiculopathy and spondylosis      Will appeal to continue Botox.  She had significant improvement after her first round (from 15 headache days a month to 2 headache days a month.  They are now daily since missing her second dose last month.  For abortive therapy, she will stop eletriptan and try Ubrelvy 143m which will hopefully be more tolerated.    Keep headache diary Follow up for Botox   Subjective:  Rachel Kenealyis a 57year old right-handed female with type 2 diabetes, hyperlipidemia, GERD, asthma, and migraine who follows up for migraines.   UPDATE: Had one round of Botox in August insurance wouldn't approve further doses. She noticed significant improvement after first round of  Botox.  They went down from 15 headache days a month to 2 days a month.  Eletriptan made her feel sick and dizzy.    Since she missed her last round in November, they are now daily.     Current NSAIDS: none Current analgesics: none Current triptans: none Current ergotamine: None Current anti-emetic: promethazine, Reglan Current muscle relaxants: tizanidine  Current anti-anxiolytic: None Current sleep aide: None Current Antihypertensive medications: propranolol 369mBID Current Antidepressant medications: Cymbalta 6063maily Current Anticonvulsant medications: Gabapentin 600 mg twice daily (for back pain) Current anti-CGRP: none Current Vitamins/Herbal/Supplements: Turmeric Current Antihistamines/Decongestants: mecllzine, Allegra   Caffeine: One cup of coffee daily Diet: Drinks plenty of water.  No soda. Exercise: No Depression: No; Anxiety: yes.  Her mother has been ill.  She has been confused. Other pain: Knee pain. Sleep hygiene: Poor.  Worrying about her mother.   HISTORY: Onset:  2011.  She does have remote history of headaches when she was younger. 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 Past NSAIDS: Cambia (made headache worse), flurbiprofen, Sprix nasal spray, ibuprofen; naproxen Past analgesics: Tramadol, Excedrin, Tylenol, Fioricet Past abortive triptans: Sumatriptan 100  mg, Zembrace-SymTouch (did not like the feeling), Zomig 5 mg NS (did not like the way it made her feel), Maxalt  (dizziness, ineffective), Relpax (made her feel sick) Past abortive ergotamine: Cafergot Past muscle relaxants: Robaxin, Flexeril Past anti-emetic: Zofran 40m, Reglan, Phenergan Past antihypertensive medications: Atenolol Past antidepressant medications: Venlafaxine XR 150 mg; nortriptyline Past anticonvulsant medications: Topiramate, Depakote Past anti-CGRP: Aimovig 1431m Emgality, Nurtec (dizziness) Past vitamins/Herbal/Supplements: None Past antihistamines/decongestants: None Other past therapies: Epidural injections (helps for 2 weeks)   Frequent Falls: For almost 10 years, she has had falls, but they have become more frequent over the past year.  Previously, she was diagnosed with vertigo.  However, she reports that she doesn't note spinning or lightheadedness.  She says she "just falls".  On one occasion, she was pushing the seat forward while sitting and just fell over.  Otherwise, it always occurs while walking or on her feet.  She denies double vision or focal numbness or weakness.  MRI of the brain with seizure protocol was performed on 08/02/11, which was unremarkable.  Sleep deprived EEG was normal.  She has had PT, which was ineffective.  She does report that she sometimes trips while walking but doesn't fall.   Cervical plain films from 05/07/15 showed mild degenerative disc disease at C5-6, but otherwise unremarkable.  Lumbar films from 06/10/15 were personally reviewed and were negative.  She underwent anothner MRI of brain with and without contrast on 02/10/16, and was stable compared to prior MRI from 2013.  NCV-EMG from 05/13/16 was negative for neuropathy or lumbosacral radiculopathy.  MRI cervical spine on 03/31/2019 showed degenerative spondylosis but no significant spinal stenosis or cord compression. Denies diplopia or visual hallucinations.  Past Medical History: Past Medical History:  Diagnosis Date   ALLERGIC RHINITIS 10/04/2007   Anemia 01/21/2011   Anxiety 11/15/2018    ASTHMA 08/02/2007   [place] 08[place]   Qualifier: Diagnosis of  By: YoWynona Luna  Blood transfusion without reported diagnosis    with first child    Cervical radiculopathy 01/21/2011   Cervicogenic headache 04/27/2017   COMMON MIGRAINE 05/15/2009   Depression 01/09/2010   Qualifier: Diagnosis of  By: JoJenny ReichmannD, [place] II 08/02/2007   Dysphagia, pharyngoesophageal phase 06/12/2014   GERD 08/02/2007   HYPERLIPIDEMIA 08/02/2007   Hyperlipidemia 08/02/2007   Qualifier: Diagnosis of  By: YoWynona Luna  [place]LEEP [place]   INTERMITTENT VERTIGO 05/15/2009   LIBIDO, DECREASED 01/09/2010   Migraine without aura 05/15/2009   Qualifier: Diagnosis of  By: JoJenny ReichmannD, JaHunt Oris  Nonallopathic lesion of rib cage 12/21/2017   Nonallopathic lesion of sacral region 07/29/2015   Nonallopathic lesion of thoracic region 07/29/2015   Osteoporosis 04/25/2019   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   Rheumatoid factor positive 02/10/2016   Right knee pain 01/31/2019   Injected January 31, 2019   Type 2 diabetes mellitus with hyperglycemia, with long-term current use of insulin ([place]/18[place]2009   Qualifier: Diagnosis of  By: YoWynona Luna   Medications: Outpatient Encounter Medications as of 06/04/2022  Medication Sig   Accu-Chek Softclix Lancets lancets Use as instructed to check blood sugar 2 times daily   aspirin 81 MG EC tablet Take 81 mg by mouth daily.   Blood Glucose Monitoring Suppl (ACCU-CHEK GUIDE) w/Device KIT Use as instructed to check blood sugar 2 times daily  Botulinum Toxin Type A (BOTOX) 200 units SOLR Inject 155 units IM Into multiple sites in the face,neck,and head every 90 days   butalbital-acetaminophen-caffeine (FIORICET) 50-325-40 MG tablet Take 1 tablet by mouth every 6 (six) hours as needed for headache. Max 2 tablets per day   Cholecalciferol (VITAMIN D) 50 MCG (2000 UT) CAPS Take 4,000  Units by mouth daily.   Diclofenac Sodium (PENNSAID) 2 % SOLN Place 2 g onto the skin 2 (two) times daily.   DULoxetine (CYMBALTA) 60 MG capsule Take 1 capsule (60 mg total) by mouth daily.   eletriptan (RELPAX) 40 MG tablet Take 1 tablet (40 mg total) by mouth as needed for migraine or headache (May repeat after 2 hours.  Maximum 2 tablets in 24 hours). May repeat in 2 hours if headache persists or recurs.   esomeprazole (NEXIUM) 40 MG capsule Take 1 capsule (40 mg total) by mouth 2 (two) times daily before a meal.   gabapentin (NEURONTIN) 300 MG capsule Take 2 capsules (600 mg total) by mouth 2 (two) times daily.   glucose blood (ACCU-CHEK GUIDE) test strip Use to check blood sugar 2 (two) times daily as directed   insulin glargine-yfgn (SEMGLEE) 100 UNIT/ML Pen Inject 30 Units into the skin daily.   linaclotide (LINZESS) 145 MCG CAPS capsule Take 1 capsule (145 mcg total) by mouth daily before breakfast   metoCLOPramide (REGLAN) 5 MG tablet Take 1 tablet (5 mg total) by mouth 3 (three) times daily 30 minutes before meals   Multiple Vitamin (MULTIVITAMIN WITH MINERALS) TABS tablet Take 1 tablet by mouth daily.   ondansetron (ZOFRAN ODT) 8 MG disintegrating tablet Take 1 tablet (8 mg total) by mouth every 8 (eight) hours as needed for nausea or vomiting.   ondansetron (ZOFRAN) 4 MG tablet Take 1 tablet (4 mg total) by mouth every 8 (eight) hours as needed for nausea & vomiting   propranolol (INDERAL) 20 MG tablet Take 1.5 tablets (30 mg total) by mouth 2 (two) times daily.   rosuvastatin (CRESTOR) 20 MG tablet Take 1 tablet (20 mg total) by mouth daily.   Semaglutide,0.25 or 0.5MG/DOS, (OZEMPIC, 0.25 OR 0.5 MG/DOSE,) 2 MG/3ML SOPN Inject 0.5 mg into the skin once a week.   tiZANidine (ZANAFLEX) 4 MG tablet Take 1 tablet (4 mg total) by mouth every 8 (eight) hours as needed for muscle spasms.   triamcinolone (NASACORT) 55 MCG/ACT AERO nasal inhaler Place 2 sprays into the nose daily.   No  facility-administered encounter medications on file as of 06/04/2022.    Allergies: Allergies  Allergen Reactions   Lipitor [Atorvastatin]     REACTION: myalgias   Nitroglycerin     Family History: Family History  Problem Relation Age of Onset   Hypertension Father    Diabetes Father    Asthma Father    Cervical cancer Maternal Aunt    Heart disease Maternal Aunt    Anesthesia problems Neg Hx    Colon cancer Neg Hx    Breast cancer Neg Hx    Esophageal cancer Neg Hx    Stomach cancer Neg Hx     Observations/Objective:   No acute distress.  Alert and oriented.  Speech fluent and not dysarthric.  Language intact.     Follow Up Instructions:    -I discussed the assessment and treatment plan with the patient. The patient was provided an opportunity to ask questions and all were answered. The patient agreed with the plan and demonstrated an understanding of the  instructions.   The patient was advised to call back or seek an in-person evaluation if the symptoms worsen or if the condition fails to improve as anticipated.   Dudley Major, DO

## 2022-06-04 ENCOUNTER — Encounter: Payer: Self-pay | Admitting: Neurology

## 2022-06-04 ENCOUNTER — Telehealth (INDEPENDENT_AMBULATORY_CARE_PROVIDER_SITE_OTHER): Payer: Commercial Managed Care - HMO | Admitting: Neurology

## 2022-06-04 ENCOUNTER — Other Ambulatory Visit (HOSPITAL_COMMUNITY): Payer: Self-pay

## 2022-06-04 VITALS — Ht 61.0 in | Wt 167.0 lb

## 2022-06-04 DIAGNOSIS — G43709 Chronic migraine without aura, not intractable, without status migrainosus: Secondary | ICD-10-CM

## 2022-06-04 DIAGNOSIS — G4486 Cervicogenic headache: Secondary | ICD-10-CM | POA: Diagnosis not present

## 2022-06-04 MED ORDER — UBRELVY 100 MG PO TABS
1.0000 | ORAL_TABLET | ORAL | 11 refills | Status: DC | PRN
  Filled 2022-06-04: qty 10, 30d supply, fill #0

## 2022-06-08 ENCOUNTER — Other Ambulatory Visit: Payer: Self-pay

## 2022-06-09 ENCOUNTER — Other Ambulatory Visit (HOSPITAL_COMMUNITY): Payer: Self-pay

## 2022-06-09 ENCOUNTER — Other Ambulatory Visit: Payer: Self-pay

## 2022-06-09 ENCOUNTER — Encounter: Payer: Self-pay | Admitting: Family Medicine

## 2022-06-09 ENCOUNTER — Ambulatory Visit: Payer: Commercial Managed Care - HMO | Admitting: Family Medicine

## 2022-06-09 VITALS — BP 112/82 | HR 91 | Ht 61.0 in | Wt 167.0 lb

## 2022-06-09 DIAGNOSIS — M9902 Segmental and somatic dysfunction of thoracic region: Secondary | ICD-10-CM | POA: Diagnosis not present

## 2022-06-09 DIAGNOSIS — M4802 Spinal stenosis, cervical region: Secondary | ICD-10-CM | POA: Diagnosis not present

## 2022-06-09 DIAGNOSIS — M170 Bilateral primary osteoarthritis of knee: Secondary | ICD-10-CM

## 2022-06-09 DIAGNOSIS — M9908 Segmental and somatic dysfunction of rib cage: Secondary | ICD-10-CM | POA: Diagnosis not present

## 2022-06-09 DIAGNOSIS — M9904 Segmental and somatic dysfunction of sacral region: Secondary | ICD-10-CM

## 2022-06-09 DIAGNOSIS — M222X1 Patellofemoral disorders, right knee: Secondary | ICD-10-CM

## 2022-06-09 DIAGNOSIS — M9901 Segmental and somatic dysfunction of cervical region: Secondary | ICD-10-CM | POA: Diagnosis not present

## 2022-06-09 DIAGNOSIS — M222X2 Patellofemoral disorders, left knee: Secondary | ICD-10-CM | POA: Diagnosis not present

## 2022-06-09 DIAGNOSIS — M9903 Segmental and somatic dysfunction of lumbar region: Secondary | ICD-10-CM | POA: Diagnosis not present

## 2022-06-09 MED ORDER — KETOROLAC TROMETHAMINE 30 MG/ML IJ SOLN
60.0000 mg | Freq: Once | INTRAMUSCULAR | Status: AC
  Administered 2022-06-09: 60 mg via INTRAMUSCULAR

## 2022-06-09 MED ORDER — HYALURONAN 88 MG/4ML IX SOSY
176.0000 mg | PREFILLED_SYRINGE | Freq: Once | INTRA_ARTICULAR | Status: AC
  Administered 2022-06-09: 176 mg via INTRA_ARTICULAR

## 2022-06-09 MED ORDER — METHYLPREDNISOLONE ACETATE 80 MG/ML IJ SUSP
80.0000 mg | Freq: Once | INTRAMUSCULAR | Status: AC
  Administered 2022-06-09: 80 mg via INTRAMUSCULAR

## 2022-06-09 NOTE — Assessment & Plan Note (Signed)
Viscosupplementation given today.  Tolerated the procedure well.  Discussed the slow onset of this medication.  Discussed which activities to do and which ones to avoid.  Increase activity slowly.  Follow-up again in 6 to 8 weeks

## 2022-06-09 NOTE — Patient Instructions (Addendum)
Good to see you Monovisc injections today See me when you return from the DR

## 2022-06-09 NOTE — Assessment & Plan Note (Addendum)
Chronic problem with worsening symptoms.  Toradol and Depo-Medrol given today.  Discussed home exercises and icing regimen.  Discussed continuing to stay active.  Follow-up with me again in 6 to 8 weeks has Zanaflex 4 mg to take at night for breakthrough pain.

## 2022-06-11 ENCOUNTER — Other Ambulatory Visit (HOSPITAL_COMMUNITY): Payer: Self-pay

## 2022-06-11 ENCOUNTER — Telehealth: Payer: Self-pay | Admitting: Pharmacy Technician

## 2022-06-11 NOTE — Telephone Encounter (Signed)
Botox[employer] verification has been submitted. Benefit Verification:   [facility]  Pharmacy PA has been submitted for BOTOX 200U via CoverMyMeds. INSURANCE: [employer]  SUBMITTED: 12.29.23 KEY: [id number]    Status is pending

## 2022-06-22 ENCOUNTER — Other Ambulatory Visit: Payer: Self-pay

## 2022-06-28 ENCOUNTER — Telehealth: Payer: Self-pay | Admitting: Anesthesiology

## 2022-06-28 NOTE — Telephone Encounter (Signed)
Pt called stating she would like to know when her next Botox appointment will be.

## 2022-06-28 NOTE — Telephone Encounter (Signed)
Patient has [employer].  PA 06/11/22 was sent tot he wrong insurance.   PA team please send new PA.

## 2022-07-01 ENCOUNTER — Ambulatory Visit: Payer: Medicaid Other | Admitting: Neurology

## 2022-07-01 ENCOUNTER — Other Ambulatory Visit: Payer: Self-pay

## 2022-07-01 ENCOUNTER — Other Ambulatory Visit (HOSPITAL_COMMUNITY): Payer: Self-pay

## 2022-07-05 ENCOUNTER — Inpatient Hospital Stay: Admission: RE | Admit: 2022-07-05 | Payer: Medicaid Other | Source: Ambulatory Visit

## 2022-07-09 ENCOUNTER — Ambulatory Visit: Payer: Medicaid Other | Admitting: Internal Medicine

## 2022-07-14 ENCOUNTER — Other Ambulatory Visit: Payer: Self-pay | Admitting: Internal Medicine

## 2022-07-14 DIAGNOSIS — Z1231 Encounter for screening mammogram for malignant neoplasm of breast: Secondary | ICD-10-CM

## 2022-07-20 ENCOUNTER — Other Ambulatory Visit (HOSPITAL_COMMUNITY): Payer: Self-pay

## 2022-07-20 NOTE — Telephone Encounter (Signed)
PA has been resubmitted to [facility] and is pending determination through [facility].   Key:  Rachel [name]

## 2022-07-27 ENCOUNTER — Ambulatory Visit: Payer: Medicaid Other | Admitting: Family Medicine

## 2022-07-29 ENCOUNTER — Ambulatory Visit: Payer: Medicaid Other

## 2022-07-29 DIAGNOSIS — R0683 Snoring: Secondary | ICD-10-CM

## 2022-07-29 DIAGNOSIS — G4733 Obstructive sleep apnea (adult) (pediatric): Secondary | ICD-10-CM | POA: Diagnosis not present

## 2022-07-30 ENCOUNTER — Ambulatory Visit: Payer: No Typology Code available for payment source | Admitting: Neurology

## 2022-07-30 ENCOUNTER — Other Ambulatory Visit (HOSPITAL_COMMUNITY): Payer: Self-pay

## 2022-08-02 ENCOUNTER — Other Ambulatory Visit (HOSPITAL_COMMUNITY): Payer: Self-pay

## 2022-08-02 ENCOUNTER — Telehealth: Payer: Self-pay

## 2022-08-02 NOTE — Telephone Encounter (Signed)
Message left on cell phone voice mail to call office to give insurance information.

## 2022-08-02 NOTE — Telephone Encounter (Signed)
Pt called requesting a PA for Ozempic. Current PA on file for [facility]. Pharmacy contacted and advised of current PA. Advised pt has another insurance listed that is rejecting the rx.

## 2022-08-03 NOTE — Progress Notes (Unsigned)
[facility] [place] [address] [place] Phone: [phone] Subjective:   IVilma [name], am serving as a scribe for Dr. Hulan Saas.  I'm seeing this patient by the request  of:  Biagio Borg, MD  CC: back and neck pain   [id number]  Rachel [name] is a 57 y.o. female coming in with complaint of back and neck pain. OMT 06/09/2022. Patient states doing well. Same per usual. No new concerns.  Medications patient has been prescribed: None  Taking:         Reviewed prior external information including notes and imaging from previsou exam, outside providers and external EMR if available.   As well as notes that were available from care everywhere and other healthcare systems.  Past medical history, social, surgical and family history all reviewed in electronic medical record.  No pertanent information unless stated regarding to the chief complaint.   Past Medical History:  Diagnosis Date   ALLERGIC RHINITIS 10/04/2007   Anemia 01/21/2011   Anxiety 11/15/2018   ASTHMA 08/02/2007   INHALERS ONLY IN [place]   Asthma 08/02/2007   Qualifier: Diagnosis of  By: Wynona Luna    Blood transfusion without reported diagnosis    with first child    Cervical radiculopathy 01/21/2011   Cervicogenic headache 04/27/2017   COMMON MIGRAINE 05/15/2009   Depression 01/09/2010   Qualifier: Diagnosis of  By: Jenny Reichmann MD, [place], TYPE II 08/02/2007   Dysphagia, pharyngoesophageal phase 06/12/2014   GERD 08/02/2007   HYPERLIPIDEMIA 08/02/2007   Hyperlipidemia 08/02/2007   Qualifier: Diagnosis of  By: Wynona Luna    INSOMNIA-SLEEP [place] 01/04/2008   INTERMITTENT VERTIGO 05/15/2009   LIBIDO, DECREASED 01/09/2010   Migraine without aura 05/15/2009   Qualifier: Diagnosis of  By: Jenny Reichmann MD, Hunt Oris    Nonallopathic lesion of rib cage 12/21/2017   Nonallopathic lesion of sacral region 07/29/2015   Nonallopathic lesion of  thoracic region 07/29/2015   Osteoporosis 04/25/2019   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   Rheumatoid factor positive 02/10/2016   Right knee pain 01/31/2019   Injected January 31, 2019   Type 2 diabetes mellitus with hyperglycemia, with long-term current use of insulin ([place]) 08/02/2007   Qualifier: Diagnosis of  By: Wynona Luna     Allergies  Allergen Reactions   Lipitor [Atorvastatin]     REACTION: myalgias   Nitroglycerin      Review of Systems:  No  visual changes, nausea, vomiting, diarrhea, constipation, dizziness, abdominal pain, skin rash, fevers, chills, night sweats, weight loss, swollen lymph nodes, body aches, joint swelling, chest pain, shortness of breath, mood changes. POSITIVE muscle aches, body aches, headache   Objective  Blood pressure 118/78, pulse (!) 102, height 5' 1"$  (1.549 m), weight 143 lb (64.9 kg), SpO2 98 %.   General: No apparent distress alert and oriented x3 mood and affect normal, dressed appropriately.  HEENT: Pupils equal, extraocular movements intact  Respiratory: Patient's speak in full sentences and does not appear short of breath  Cardiovascular: No lower extremity edema, non tender, no erythema  Neck exam does have significant stiffness noted.  More than patient's usual.  Tightness of the paraspinal musculature.  Patient's pain still out of proportion to the amount of palpation.  Patient still does have tightness noted as well.  Patient does have a resolving sunburn on her shoulders.  Osteopathic findings  C2 flexed  rotated and side bent right C7 flexed rotated and side bent left T3 extended rotated and side bent right inhaled rib T8 extended rotated and side bent left L2 flexed rotated and side bent right Sacrum right on right       Assessment and Plan:  Cervicogenic headache Cervicogenic headache that is worse after traveling recently.  Discussed icing regimen and home exercises, discussed which activities to do and  which ones to avoid.  Increase activity and icing regimen.  Toradol and Depo-Medrol injections given today to help with any type of exacerbation of the pain.  Follow-up again in 6 to 8 weeks    Nonallopathic problems  Decision today to treat with OMT was based on Physical Exam  After verbal consent patient was treated with HVLA, ME, FPR techniques in cervical, rib, thoracic, lumbar, and sacral  areas  Patient tolerated the procedure well with improvement in symptoms  Patient given exercises, stretches and lifestyle modifications  See medications in patient instructions if given  Patient will follow up in 4-8 weeks    The above documentation has been reviewed and is accurate and complete Lyndal Pulley, DO          Note: This dictation was prepared with Dragon dictation along with smaller phrase technology. Any transcriptional errors that result from this process are unintentional.

## 2022-08-05 ENCOUNTER — Ambulatory Visit: Payer: Medicaid Other | Admitting: Family Medicine

## 2022-08-05 VITALS — BP 118/78 | HR 102 | Ht 61.0 in | Wt 143.0 lb

## 2022-08-05 DIAGNOSIS — M9902 Segmental and somatic dysfunction of thoracic region: Secondary | ICD-10-CM | POA: Diagnosis not present

## 2022-08-05 DIAGNOSIS — M9903 Segmental and somatic dysfunction of lumbar region: Secondary | ICD-10-CM

## 2022-08-05 DIAGNOSIS — M9904 Segmental and somatic dysfunction of sacral region: Secondary | ICD-10-CM

## 2022-08-05 DIAGNOSIS — G4486 Cervicogenic headache: Secondary | ICD-10-CM | POA: Diagnosis not present

## 2022-08-05 DIAGNOSIS — M9901 Segmental and somatic dysfunction of cervical region: Secondary | ICD-10-CM

## 2022-08-05 DIAGNOSIS — M9908 Segmental and somatic dysfunction of rib cage: Secondary | ICD-10-CM

## 2022-08-05 MED ORDER — METHYLPREDNISOLONE ACETATE 40 MG/ML IJ SUSP
40.0000 mg | Freq: Once | INTRAMUSCULAR | Status: AC
  Administered 2022-08-05: 40 mg via INTRAMUSCULAR

## 2022-08-05 MED ORDER — KETOROLAC TROMETHAMINE 30 MG/ML IJ SOLN
30.0000 mg | Freq: Once | INTRAMUSCULAR | Status: AC
  Administered 2022-08-05: 30 mg via INTRAMUSCULAR

## 2022-08-05 NOTE — Assessment & Plan Note (Signed)
Cervicogenic headache that is worse after traveling recently.  Discussed icing regimen and home exercises, discussed which activities to do and which ones to avoid.  Increase activity and icing regimen.  Toradol and Depo-Medrol injections given today to help with any type of exacerbation of the pain.  Follow-up again in 6 to 8 weeks

## 2022-08-05 NOTE — Patient Instructions (Signed)
Injections in backside See me in 6-8 weeks Yoga wheel

## 2022-08-06 DIAGNOSIS — G4733 Obstructive sleep apnea (adult) (pediatric): Secondary | ICD-10-CM | POA: Diagnosis not present

## 2022-08-06 NOTE — Telephone Encounter (Signed)
Patient Advocate Encounter  Received a fax from [facility] regarding Prior Authorization for Botox.   Authorization has been DENIED due to   Determination letter attached to patient chart

## 2022-08-09 NOTE — Telephone Encounter (Signed)
Message left on cell phone voice that insurance information is needed for the prior authorization for Ozempic.

## 2022-08-11 NOTE — Telephone Encounter (Signed)
It seems that I am not in her network, which is why they will not approve her Botox with me.  They will only approve Botox if she sees a neurologist that is in her network.   I recommend that she contact her insurance to find out what she can do for them to make an exception.  Otherwise, she would unfortunately need to see another neurologist or she can follow up to discuss other options.  Advised patient of note above. Per patient  her called to let her know that her insurance will cover Botox.  Advised patient to please bring insurance information by the office so we can review.

## 2022-08-12 ENCOUNTER — Encounter: Payer: Self-pay | Admitting: Nurse Practitioner

## 2022-08-12 ENCOUNTER — Ambulatory Visit: Payer: Medicaid Other | Admitting: Nurse Practitioner

## 2022-08-12 VITALS — BP 102/74 | HR 90 | Ht 60.0 in | Wt 143.6 lb

## 2022-08-12 DIAGNOSIS — G4733 Obstructive sleep apnea (adult) (pediatric): Secondary | ICD-10-CM | POA: Diagnosis not present

## 2022-08-12 NOTE — Progress Notes (Signed)
$'@Patient'J$  ID: Rachel [name], female    DOB: [birth year], 57 y.o.   MRN: [id number]  Chief Complaint  Patient presents with   Follow-up    Pt f/u after HST, just here to discuss results    Referring provider: Biagio Borg, MD  HPI: 57 year old female, never smoker referred for sleep consult 02/2022.  Past medical history significant for migraines, orthostatic hypotension, asthma, GERD, DM 2, vertigo, HLD, depression, insomnia, anxiety, positive rheumatoid factor.  TEST/EVENTS:  07/29/2022 HST: AHI 17.4, SpO2 low 79%  02/24/2022: OV with Jonothan Heberle NP for sleep consult referred by Dr. [name] with sports medicine.  She has been struggling with chronic headaches for a while now.  Neurology work-up was unremarkable.  She was seen by Dr. [name] for possible cervicogenic headaches.  He started inquiring about her sleep.  She reported that she has been told that she snores very loudly by her husband.  She also wakes up throughout the night and feels fatigued during the day.  He wanted her to undergo sleep study for further work-up and to rule out OSA as underlying cause of her headaches.  Today, she reports that she has had the symptoms for many years.  She wakes up frequently throughout the night for various reasons; struggles to go back to sleep.  Wakes up in the morning feeling poorly rested.  She also usually has a dull, aching headache.  She denies any witnessed apneas, drowsy driving, sleep parasomnia/paralysis, narcolepsy or cataplexy. She goes to bed between 10 to 11 PM.  It takes her a long time to fall asleep; some times hours.  She does not take anything to help her fall asleep at night.  She will occasionally drink a herbal tea or listen to her Bible which help relax her.  Wakes 2-3 times a night.  Officially gets out of bed in the morning around 8 AM.  Weight has been stable over the last few years.  No history of stroke or heart failure.  She does have diabetes which is controlled with insulin and  Ozempic. She has never smoker.  Does not drink any alcohol.  Lives at home with her husband.  She is a former [employer]; currently on disability.  Family history significant for father with asthma and heart disease.  [place] 20  08/12/2022: Today -follow-up Patient presents today for follow-up to discuss home sleep study results.  She had an AHI of 17.4, consistent with moderate obstructive sleep apnea.  Her symptoms are unchanged compared to her previous visit.  She continues to struggle with chronic headaches, daytime fatigue and restless sleep at night.  Has not had any issues with drowsy driving, sleep parasomnia/paralysis.  No symptoms of narcolepsy or cataplexy.  She would like to discuss treatment options further.  Allergies  Allergen Reactions   Lipitor [Atorvastatin]     REACTION: myalgias   Nitroglycerin     Immunization History  Administered Date(s) Administered   H1N1 04/15/2008   Influenza Whole 04/22/2008, 03/14/2010   Influenza,inj,Quad PF,6+ Mos 03/13/2014, 02/28/2019, 02/26/2020   Influenza-Unspecified 03/09/2017, 02/14/2018   Moderna Sars-Covid-2 Vaccination 04/25/2020, 06/03/2020   Pneumococcal Conjugate-13 07/09/2015   Pneumococcal Polysaccharide-23 07/08/2008, 06/25/2016   Td 10/03/2006   Tdap 06/25/2016    Past Medical History:  Diagnosis Date   ALLERGIC RHINITIS 10/04/2007   Anemia 01/21/2011   Anxiety 11/15/2018   ASTHMA 08/02/2007   INHALERS ONLY IN SPRING   Asthma 08/02/2007   Qualifier: Diagnosis of  By: Shawna Orleans DO,  Sandy Salaam    Blood transfusion without reported diagnosis    with first child    Cervical radiculopathy 01/21/2011   Cervicogenic headache 04/27/2017   COMMON MIGRAINE 05/15/2009   Depression 01/09/2010   Qualifier: Diagnosis of  By: Jenny Reichmann MD, [place], TYPE II 08/02/2007   Dysphagia, pharyngoesophageal phase 06/12/2014   GERD 08/02/2007   HYPERLIPIDEMIA 08/02/2007   Hyperlipidemia 08/02/2007   Qualifier: Diagnosis of  By: Wynona Luna    INSOMNIA-SLEEP DISORDER-UNSPEC 01/04/2008   INTERMITTENT VERTIGO 05/15/2009   LIBIDO, DECREASED 01/09/2010   Migraine without aura 05/15/2009   Qualifier: Diagnosis of  By: Jenny Reichmann MD, Hunt Oris    Nonallopathic lesion of rib cage 12/21/2017   Nonallopathic lesion of sacral region 07/29/2015   Nonallopathic lesion of thoracic region 07/29/2015   Osteoporosis 04/25/2019   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   Rheumatoid factor positive 02/10/2016   Right knee pain 01/31/2019   Injected January 31, 2019   Type 2 diabetes mellitus with hyperglycemia, with long-term current use of insulin ([place]) 08/02/2007   Qualifier: Diagnosis of  By: Wynona Luna     Tobacco History: Social History   Tobacco Use  Smoking Status Never  Smokeless Tobacco Never   Counseling given: Not Answered   Outpatient Medications Prior to Visit  Medication Sig Dispense Refill   Accu-Chek Softclix Lancets lancets Use as instructed to check blood sugar 2 times daily 100 each 0   aspirin 81 MG EC tablet Take 81 mg by mouth daily.     Blood Glucose Monitoring Suppl (ACCU-CHEK GUIDE) w/Device KIT Use as instructed to check blood sugar 2 times daily 1 kit 0   Botulinum Toxin Type A (BOTOX) 200 units SOLR Inject 155 units IM Into multiple sites in the face,neck,and head every 90 days 1 each 4   butalbital-acetaminophen-caffeine (FIORICET) 50-325-40 MG tablet Take 1 tablet by mouth every 6 (six) hours as needed for headache. Max 2 tablets per day 30 tablet 3   Cholecalciferol (VITAMIN D) 50 MCG (2000 UT) CAPS Take 4,000 Units by mouth daily.     Diclofenac Sodium (PENNSAID) 2 % SOLN Place 2 g onto the skin 2 (two) times daily. 112 g 3   DULoxetine (CYMBALTA) 60 MG capsule Take 1 capsule (60 mg total) by mouth daily. 90 capsule 3   esomeprazole (NEXIUM) 40 MG capsule Take 1 capsule (40 mg total) by mouth 2 (two) times daily before a meal. 180 capsule 3   gabapentin (NEURONTIN) 300 MG capsule Take 2 capsules  (600 mg total) by mouth 2 (two) times daily. 120 capsule 5   glucose blood (ACCU-CHEK GUIDE) test strip Use to check blood sugar 2 (two) times daily as directed 200 each 3   insulin glargine-yfgn (SEMGLEE) 100 UNIT/ML Pen Inject 30 Units into the skin daily. 30 mL 3   linaclotide (LINZESS) 145 MCG CAPS capsule Take 1 capsule (145 mcg total) by mouth daily before breakfast 30 capsule 2   metoCLOPramide (REGLAN) 5 MG tablet Take 1 tablet (5 mg total) by mouth 3 (three) times daily 30 minutes before meals 90 tablet 1   Multiple Vitamin (MULTIVITAMIN WITH MINERALS) TABS tablet Take 1 tablet by mouth daily.     ondansetron (ZOFRAN ODT) 8 MG disintegrating tablet Take 1 tablet (8 mg total) by mouth every 8 (eight) hours as needed for nausea or vomiting. 30 tablet 0   ondansetron (ZOFRAN) 4 MG tablet Take 1  tablet (4 mg total) by mouth every 8 (eight) hours as needed for nausea & vomiting 40 tablet 1   propranolol (INDERAL) 20 MG tablet Take 1.5 tablets (30 mg total) by mouth 2 (two) times daily. 90 tablet 5   rosuvastatin (CRESTOR) 20 MG tablet Take 1 tablet (20 mg total) by mouth daily. 90 tablet 3   Semaglutide,0.25 or 0.'5MG'$ /DOS, (OZEMPIC, 0.25 OR 0.5 MG/DOSE,) 2 MG/3ML SOPN Inject 0.5 mg into the skin once a week. 9 mL 3   tiZANidine (ZANAFLEX) 4 MG tablet Take 1 tablet (4 mg total) by mouth every 8 (eight) hours as needed for muscle spasms. 90 tablet 5   triamcinolone (NASACORT) 55 MCG/ACT AERO nasal inhaler Place 2 sprays into the nose daily. 1 Inhaler 12   Ubrogepant (UBRELVY) 100 MG TABS Take 1 tablet by mouth as needed. May repeat after 2 hours.  Maximum 2 tablets in 24 hours. 10 tablet 11   No facility-administered medications prior to visit.     Review of Systems:   Constitutional: No weight loss or gain, night sweats, fevers, chills, or lassitude. +excessive daytime fatigue HEENT: No difficulty swallowing, tooth/dental problems, or sore throat. No sneezing, itching, ear ache, nasal  congestion, or post nasal drip. +headaches CV:  No chest pain, orthopnea, PND, swelling in lower extremities, anasarca, dizziness, palpitations, syncope Resp: +shortness of breath with exertion (baseline); occasional cough. No excess mucus or change in color of mucus. No hemoptysis. No wheezing.  No chest wall deformity GI:  +heartburn, indigestion. No abdominal pain, nausea, vomiting, diarrhea, change in bowel habits, loss of appetite, bloody stools.  GU: No dysuria, change in color of urine, urgency or frequency.  No flank pain, no hematuria  Skin: No rash, lesions, ulcerations MSK:  No joint pain or swelling.  No decreased range of motion.  No back pain. Neuro: No dizziness or lightheadedness.  Psych: No anxiety. +depression (stable). No SI/HI. Mood stable. +sleep disturbance    Physical Exam:  BP 102/74   Pulse 90   Ht 5' (1.524 m)   Wt 143 lb 9.6 oz (65.1 kg)   SpO2 99%   BMI 28.04 kg/m   GEN: Pleasant, interactive, well-appearing; in no acute distress. HEENT:  Normocephalic and atraumatic. PERRLA. Sclera white. Nasal turbinates pink, moist and patent bilaterally. No rhinorrhea present. Oropharynx pink and moist, without exudate or edema. No lesions, ulcerations, or postnasal drip. Mallampati II NECK:  Supple w/ fair ROM. No JVD present. Normal carotid impulses w/o bruits. Thyroid symmetrical with no goiter or nodules palpated. No lymphadenopathy.   CV: RRR, no m/r/g, no peripheral edema. Pulses intact, +2 bilaterally. No cyanosis, pallor or clubbing. PULMONARY:  Unlabored, regular breathing. Clear bilaterally A&P w/o wheezes/rales/rhonchi. No accessory muscle use. No dullness to percussion. GI: BS present and normoactive. Soft, non-tender to palpation.  MSK: No erythema, warmth or tenderness. No deformities or joint swelling noted.  Neuro: A/Ox3. No focal deficits noted.   Skin: Warm, no lesions or rashe Psych: Normal affect and behavior. Judgement and thought content  appropriate.     Lab Results:  CBC    Component Value Date/Time   WBC 7.4 04/07/2022 1307   RBC 4.10 04/07/2022 1307   HGB 11.1 (L) 04/07/2022 1307   HGB 12.4 02/20/2020 1402   HGB 11.9 03/29/2011 1034   HCT 33.8 (L) 04/07/2022 1307   HCT 37.5 02/20/2020 1402   HCT 36.6 03/29/2011 1034   PLT 340.0 04/07/2022 1307   PLT 367 02/20/2020 1402   MCV  82.6 04/07/2022 1307   MCV 81 02/20/2020 1402   MCV 75.5 (L) 03/29/2011 1034   MCH 26.7 02/20/2020 1402   MCH 26.0 01/21/2020 1250   MCHC 32.9 04/07/2022 1307   RDW 13.6 04/07/2022 1307   RDW 13.9 02/20/2020 1402   RDW 14.5 03/29/2011 1034   LYMPHSABS 3.8 04/07/2022 1307   LYMPHSABS 4.1 (H) 03/29/2011 1034   MONOABS 0.6 04/07/2022 1307   MONOABS 0.7 03/29/2011 1034   EOSABS 0.1 04/07/2022 1307   EOSABS 0.2 03/29/2011 1034   BASOSABS 0.0 04/07/2022 1307   BASOSABS 0.1 03/29/2011 1034    BMET    Component Value Date/Time   NA 136 04/07/2022 1307   NA 136 02/20/2020 1402   K 4.1 04/07/2022 1307   CL 102 04/07/2022 1307   CO2 28 04/07/2022 1307   GLUCOSE 114 (H) 04/07/2022 1307   BUN 11 04/07/2022 1307   BUN 8 02/20/2020 1402   CREATININE 0.68 04/07/2022 1307   CALCIUM 9.5 04/07/2022 1307   GFRNONAA 89 02/20/2020 1402   GFRAA 102 02/20/2020 1402    BNP No results found for: "BNP"   Imaging:  No results found.  ketorolac (TORADOL) 30 MG/ML injection 30 mg     Date Action Dose Route User   08/05/2022 [phone] Given 30 mg Intramuscular (Left Upper Outer Quadrant) Collins, Briana R, CMA      methylPREDNISolone acetate (DEPO-MEDROL) injection 40 mg     Date Action Dose Route User   08/05/2022 [phone] Given 40 mg Intramuscular (Left Upper Outer Quadrant) Collins, Briana R, CMA           No data to display          No results found for: "NITRICOXIDE"      Assessment & Plan:   Moderate obstructive sleep apnea Home sleep study revealed moderate obstructive sleep apnea with AHI 17.4/h.  We reviewed risks  of untreated sleep apnea as well as potential treatment options.  She was willing to move forward with CPAP therapy.  Would prefer to start with a nasal mask, which I think is appropriate for her.  Will start her on auto setting 5 to 15 cm water.  Educated on proper use/care of device.  Risks/benefits reviewed.  Verbalized understanding and all questions addressed.  Cautioned on safe driving practices.  Patient Instructions  Start CPAP 5-15 cmH2O every night, minimum of 4-6 hours a night.  Change equipment every 30 days or as directed by DME. Wash your tubing with warm soap and water daily, hang to dry. Wash humidifier portion weekly.  Be aware of reduced alertness and do not drive or operate heavy machinery if experiencing this or drowsiness.  Exercise encouraged, as tolerated. Notify if persistent daytime sleepiness occurs even with consistent use of CPAP.  We discussed how untreated sleep apnea puts an individual at risk for cardiac arrhthymias, pulm HTN, DM, stroke and increases their risk for daytime accidents. We also briefly reviewed treatment options including weight loss, side sleeping position, oral appliance, CPAP therapy or referral to ENT for possible surgical options  Follow up in 12 weeks with Dr. Ander Slade (new pt 30 min slot), or sooner, if needed     I spent 32 minutes of dedicated to the care of this patient on the date of this encounter to include pre-visit review of records, face-to-face time with the patient discussing conditions above, post visit ordering of testing, clinical documentation with the electronic health record, making appropriate referrals as documented, and communicating  necessary findings to members of the patients care team.  Clayton Bibles, NP 08/12/2022  Pt aware and understands NP's role.

## 2022-08-12 NOTE — Assessment & Plan Note (Signed)
Home sleep study revealed moderate obstructive sleep apnea with AHI 17.4/h.  We reviewed risks of untreated sleep apnea as well as potential treatment options.  She was willing to move forward with CPAP therapy.  Would prefer to start with a nasal mask, which I think is appropriate for her.  Will start her on auto setting 5 to 15 cm water.  Educated on proper use/care of device.  Risks/benefits reviewed.  Verbalized understanding and all questions addressed.  Cautioned on safe driving practices.  Patient Instructions  Start CPAP 5-15 cmH2O every night, minimum of 4-6 hours a night.  Change equipment every 30 days or as directed by DME. Wash your tubing with warm soap and water daily, hang to dry. Wash humidifier portion weekly.  Be aware of reduced alertness and do not drive or operate heavy machinery if experiencing this or drowsiness.  Exercise encouraged, as tolerated. Notify if persistent daytime sleepiness occurs even with consistent use of CPAP.  We discussed how untreated sleep apnea puts an individual at risk for cardiac arrhthymias, pulm HTN, DM, stroke and increases their risk for daytime accidents. We also briefly reviewed treatment options including weight loss, side sleeping position, oral appliance, CPAP therapy or referral to ENT for possible surgical options  Follow up in 12 weeks with Dr. Ander Slade (new pt 30 min slot), or sooner, if needed

## 2022-08-12 NOTE — Patient Instructions (Signed)
Start CPAP 5-15 cmH2O every night, minimum of 4-6 hours a night.  Change equipment every 30 days or as directed by DME. Wash your tubing with warm soap and water daily, hang to dry. Wash humidifier portion weekly.  Be aware of reduced alertness and do not drive or operate heavy machinery if experiencing this or drowsiness.  Exercise encouraged, as tolerated. Notify if persistent daytime sleepiness occurs even with consistent use of CPAP.  We discussed how untreated sleep apnea puts an individual at risk for cardiac arrhthymias, pulm HTN, DM, stroke and increases their risk for daytime accidents. We also briefly reviewed treatment options including weight loss, side sleeping position, oral appliance, CPAP therapy or referral to ENT for possible surgical options  Follow up in 12 weeks with Dr. Ander Slade (new pt 30 min slot), or sooner, if needed

## 2022-08-13 ENCOUNTER — Ambulatory Visit: Payer: Medicaid Other | Admitting: Internal Medicine

## 2022-08-13 ENCOUNTER — Encounter: Payer: Self-pay | Admitting: Internal Medicine

## 2022-08-13 ENCOUNTER — Other Ambulatory Visit (HOSPITAL_COMMUNITY): Payer: Self-pay

## 2022-08-13 VITALS — BP 112/80 | HR 106 | Ht 60.0 in | Wt 143.8 lb

## 2022-08-13 DIAGNOSIS — Z794 Long term (current) use of insulin: Secondary | ICD-10-CM

## 2022-08-13 DIAGNOSIS — G63 Polyneuropathy in diseases classified elsewhere: Secondary | ICD-10-CM

## 2022-08-13 DIAGNOSIS — E1165 Type 2 diabetes mellitus with hyperglycemia: Secondary | ICD-10-CM

## 2022-08-13 DIAGNOSIS — E782 Mixed hyperlipidemia: Secondary | ICD-10-CM

## 2022-08-13 DIAGNOSIS — E663 Overweight: Secondary | ICD-10-CM

## 2022-08-13 LAB — POCT GLYCOSYLATED HEMOGLOBIN (HGB A1C): Hemoglobin A1C: 8.4 % — AB (ref 4.0–5.6)

## 2022-08-13 MED ORDER — FREESTYLE LIBRE 3 SENSOR MISC
1.0000 | 3 refills | Status: DC
  Filled 2022-08-13: qty 6, 84d supply, fill #0

## 2022-08-13 NOTE — Patient Instructions (Addendum)
Please use: - Lantus 28 units in a.m. - Ozempic 0.5 mg weekly in a.m.  Please return in 3 months with your sugar log.

## 2022-08-13 NOTE — Progress Notes (Signed)
Subjective:     Patient ID: Rachel [name], female   DOB: 05-May-1966, 57 y.o.   MRN: [id number]  HPI Rachel [name] is a  57 y.o. woman, returning for f/u for DM2, dx 1987, uncontrolled, insulin-dependent, with probable complications (? peripheral neuropathy, ?  Gastroparesis).  Last visit 4 months ago.  Interim history: No increased urination, blurry vision, chest pain.  She has constipation and takes Linzess. Se was dx'ed with OSA >> will start using a CPAP machine. She was off Ozempic for 2 months >> just restarted.  Reviewed HbA1c levels: Lab Results  Component Value Date   HGBA1C 7.7 (H) 04/07/2022   HGBA1C 7.2 (A) 03/09/2022   HGBA1C 7.2 (A) 11/06/2021   HGBA1C 6.8 (A) 08/04/2021   HGBA1C 6.7 (A) 03/30/2021   HGBA1C 8.3 (H) 12/30/2020   HGBA1C 6.6 (A) 09/16/2020   HGBA1C 7.8 (A) 06/17/2020   HGBA1C 10.0 (H) 08/29/2019   HGBA1C 11.1 (A) 07/02/2019   HGBA1C 10.2 (H) 11/14/2018   HGBA1C 11.2 (H) 08/23/2018   HGBA1C 8.6 (A) 04/26/2018   HGBA1C 0 04/26/2018   HGBA1C 0 (A) 04/26/2018   HGBA1C 0.0 04/26/2018   HGBA1C 12.7 (A) 11/23/2017   HGBA1C 9.2 02/17/2017   HGBA1C 9.2 11/11/2016   HGBA1C 11.5 (H) 06/25/2016   HGBA1C 10.9 04/29/2015   HGBA1C 10.8 (H) 01/09/2015   HGBA1C 12.7 (H) 06/24/2014   HGBA1C 15.1 (H) 10/04/2013   HGBA1C 8.4 (H) 01/11/2013   HGBA1C 8.6 (H) 10/02/2012   HGBA1C 10.1 (H) 06/13/2012   HGBA1C 9.9 (H) 01/25/2012   HGBA1C 11.0 (H) 06/18/2011   HGBA1C 9.8 (H) 01/15/2011  02/17/2017: HbA1c calculated from fructosamine is better, at 8.34%, but still high. Prev. 10.1%.  Reviewed history: She came off all DM medicines for 6 mo before the HbA1c in 09/2013. She again ran out of all meds in 04/2016.  She is  on: - Lantus 40 >> 34 >> 36 >> 32 >> 28 >> Semglee 24 >> 30 >> 24-28 units in a.m. - Ozempic 0.5 mg weekly - added 06/2019 >> 0.25 >> 0.5 mg weekly - just restarted Stopped Januvia 100 mg in am >> then Ozempic 0.5 mg weekly She is off Metformin XR 1000  mg 2x a day with b'fast and dinner >> upset stomach >> stopped [id number] We triedTrulicity 1.5 mg weekly >> worked great but had to stop b/c GERD >> stopped. We tried Jardiance 25 mg daily >> started 08/2016 >> but stopped recently 2/2 recurrent UTIs She was on Glipizide 5 mg bid - added 04/2015 >> was not taking it b/c low CBGs. We stopped Glipizide XL 5 mg in 07/2014. She had episodes of hypoglycemia with Amaryl in the past. She was on Bydureon 2 mg weekly (had nausea). Previously on Humalog, then NovoLog -started 12/2020  She checks her sugars 1-2 times a day: - am:  103-126, 154 >> 160 w/o insulin; 100-130 w/ insulin >> on Ozempic: 80, 124-138 - after b'fast: 158-251 >> n/c  - before lunch: 60 x1 >> n/c >> 75 (fasting, w/o insulin) >> 99 >> on Ozempic: n/c  - after lunch: up to 200s >> n/c - before dinner:  180-200 >> 137, 150 >> 140-150  >> n/c - after dinner: 166-245 >> n/c - bedtime: n/c >> 300 >> 100-125 (has snack) >> n/c >> 130s >> 145-150s >> n/c >> 200 She has hypoglycemia awareness in the 90s. Lowest: 38 (while on Trulicity) >> ... 96 >> 75 >> 82. Highest 600 >> .Marland KitchenMarland Kitchen  154 (Mother's day) >> 150s >> 200 (candy).  She saw nutrition in the past.  -+ HL. Last lipids: Lab Results  Component Value Date   CHOL 243 (H) 04/07/2022   HDL 56.20 04/07/2022   LDLCALC 153 (H) 10/12/2021   LDLDIRECT 170.0 04/07/2022   TRIG 220.0 (H) 04/07/2022   CHOLHDL 4 04/07/2022  On Crestor 40 - restarted after  the above labs returned.  -No CKD: Lab Results  Component Value Date   BUN 11 04/07/2022   Lab Results  Component Value Date   CREATININE 0.68 04/07/2022   -+ Numbness and tingling in the right leg.  Last foot exam 11/06/2021.  - Latest eye appointment was in 06/2022: No DR reportedly.  She had a gastric emptying study (02/03/2021) showed gastroparesis (but checked on Ozempic!). Started on Reglan, but not using it consistently. 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  She is  on disability.  Review of Systems + see HPI  I reviewed pt's medications, allergies, PMH, social hx, family hx, and changes were documented in the history of present illness. Otherwise, unchanged from my initial visit note.  Past Medical History:  Diagnosis Date   ALLERGIC RHINITIS 10/04/2007   Anemia 01/21/2011   Anxiety 11/15/2018   ASTHMA 08/02/2007   INHALERS ONLY IN [place]   Asthma 08/02/2007   Qualifier: Diagnosis of  By: Wynona Luna    Blood transfusion without reported diagnosis    with first child    Cervical radiculopathy 01/21/2011   Cervicogenic headache 04/27/2017   COMMON MIGRAINE 05/15/2009   Depression 01/09/2010   Qualifier: Diagnosis of  By: Jenny Reichmann MD, [place], TYPE II 08/02/2007   Dysphagia, pharyngoesophageal phase 06/12/2014   GERD 08/02/2007   HYPERLIPIDEMIA 08/02/2007   Hyperlipidemia 08/02/2007   Qualifier: Diagnosis of  By: Wynona Luna    INSOMNIA-SLEEP DISORDER-UNSPEC 01/04/2008   INTERMITTENT VERTIGO 05/15/2009   LIBIDO, DECREASED 01/09/2010   Migraine without aura 05/15/2009   Qualifier: Diagnosis of  By: Jenny Reichmann MD, [place] [facility]    Nonallopathic lesion of rib cage 12/21/2017   Nonallopathic lesion of sacral region 07/29/2015   Nonallopathic lesion of thoracic region 07/29/2015   Osteoporosis 04/25/2019   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   Rheumatoid factor positive 02/10/2016   Right knee pain 01/31/2019   Injected January 31, 2019   Type 2 diabetes mellitus with hyperglycemia, with long-term current use of insulin ([place]) 08/02/2007   Qualifier: Diagnosis of  By: Wynona Luna    Past Surgical History:  Procedure Laterality Date   CESAREAN SECTION     x 3   CYST EXCISION     left knee   PAROTIDECTOMY  10/21/2011   Procedure: PAROTIDECTOMY;  Surgeon: Melida Quitter, MD;  Location: [facility] OR;  Service: ENT;  Laterality: Left;   Social History   Socioeconomic History   Marital status: Married    Spouse name: [name]   Number of  children: 3   Years of education: Degree   Highest education level: Not on file  Occupational History   Occupation: Chartered certified accountant    Employer: [employer]   Occupation: [place] [place]    Employer: [employer]  Tobacco Use   Smoking status: Never   Smokeless tobacco: Never  Vaping Use   Vaping Use: Never used  Substance and Sexual Activity   Alcohol use: No    Alcohol/week: 0.0 standard drinks of  alcohol   Drug use: No   Sexual activity: Not on file  Other Topics Concern   Not on file  Social History Narrative   She has 3 daughters.   Patient has a 4 year degree.    Patient working at [employer].    Patient is married to [place].   Social Determinants of Health   Financial Resource Strain: Not on file  Food Insecurity: Not on file  Transportation Needs: Not on file  Physical Activity: Not on file  Stress: Not on file  Social Connections: Not on file  Intimate Partner Violence: Not on file   Current Outpatient Medications on File Prior to Visit  Medication Sig Dispense Refill   Accu-Chek Softclix Lancets lancets Use as instructed to check blood sugar 2 times daily 100 each 0   aspirin 81 MG EC tablet Take 81 mg by mouth daily.     Blood Glucose Monitoring Suppl (ACCU-CHEK GUIDE) w/Device KIT Use as instructed to check blood sugar 2 times daily 1 kit 0   Botulinum Toxin Type A (BOTOX) 200 units SOLR Inject 155 units IM Into multiple sites in the face,neck,and head every 90 days 1 each 4   butalbital-acetaminophen-caffeine (FIORICET) 50-325-40 MG tablet Take 1 tablet by mouth every 6 (six) hours as needed for headache. Max 2 tablets per day 30 tablet 3   Cholecalciferol (VITAMIN D) 50 MCG (2000 UT) CAPS Take 4,000 Units by mouth daily.     Diclofenac Sodium (PENNSAID) 2 % SOLN Place 2 g onto the skin 2 (two) times daily. 112 g 3   DULoxetine (CYMBALTA) 60 MG capsule Take 1 capsule (60 mg total) by mouth daily. 90 capsule 3   esomeprazole (NEXIUM) 40 MG capsule Take 1  capsule (40 mg total) by mouth 2 (two) times daily before a meal. 180 capsule 3   gabapentin (NEURONTIN) 300 MG capsule Take 2 capsules (600 mg total) by mouth 2 (two) times daily. 120 capsule 5   glucose blood (ACCU-CHEK GUIDE) test strip Use to check blood sugar 2 (two) times daily as directed 200 each 3   insulin glargine-yfgn (SEMGLEE) 100 UNIT/ML Pen Inject 30 Units into the skin daily. 30 mL 3   linaclotide (LINZESS) 145 MCG CAPS capsule Take 1 capsule (145 mcg total) by mouth daily before breakfast 30 capsule 2   metoCLOPramide (REGLAN) 5 MG tablet Take 1 tablet (5 mg total) by mouth 3 (three) times daily 30 minutes before meals 90 tablet 1   Multiple Vitamin (MULTIVITAMIN WITH MINERALS) TABS tablet Take 1 tablet by mouth daily.     ondansetron (ZOFRAN ODT) 8 MG disintegrating tablet Take 1 tablet (8 mg total) by mouth every 8 (eight) hours as needed for nausea or vomiting. 30 tablet 0   ondansetron (ZOFRAN) 4 MG tablet Take 1 tablet (4 mg total) by mouth every 8 (eight) hours as needed for nausea & vomiting 40 tablet 1   propranolol (INDERAL) 20 MG tablet Take 1.5 tablets (30 mg total) by mouth 2 (two) times daily. 90 tablet 5   rosuvastatin (CRESTOR) 20 MG tablet Take 1 tablet (20 mg total) by mouth daily. 90 tablet 3   Semaglutide,0.25 or 0.'5MG'$ /DOS, (OZEMPIC, 0.25 OR 0.5 MG/DOSE,) 2 MG/3ML SOPN Inject 0.5 mg into the skin once a week. 9 mL 3   tiZANidine (ZANAFLEX) 4 MG tablet Take 1 tablet (4 mg total) by mouth every 8 (eight) hours as needed for muscle spasms. 90 tablet 5   triamcinolone (NASACORT) 55  MCG/ACT AERO nasal inhaler Place 2 sprays into the nose daily. 1 Inhaler 12   Ubrogepant (UBRELVY) 100 MG TABS Take 1 tablet by mouth as needed. May repeat after 2 hours.  Maximum 2 tablets in 24 hours. 10 tablet 11   No current facility-administered medications on file prior to visit.   Allergies  Allergen Reactions   Lipitor [Atorvastatin]     REACTION: myalgias   Nitroglycerin     Family History  Problem Relation Age of Onset   Hypertension Father    Diabetes Father    Asthma Father    Cervical cancer Maternal Aunt    Heart disease Maternal Aunt    Anesthesia problems Neg Hx    Colon cancer Neg Hx    Breast cancer Neg Hx    Esophageal cancer Neg Hx    Stomach cancer Neg Hx     Objective:   Physical Exam BP 112/80 (BP Location: Left Arm, Patient Position: Sitting, Cuff Size: Normal)   Pulse (!) 106   Ht 5' (1.524 m)   Wt 143 lb 12.8 oz (65.2 kg)   SpO2 98%   BMI 28.08 kg/m   Wt Readings from Last 3 Encounters:  08/13/22 143 lb 12.8 oz (65.2 kg)  08/12/22 143 lb 9.6 oz (65.1 kg)  08/05/22 143 lb (64.9 kg)   Constitutional: normal weight, in NAD Eyes:  EOMI, no exophthalmos ENT: no neck masses, no cervical lymphadenopathy Cardiovascular: tachycardia, RR, No MRG Respiratory: CTA B Musculoskeletal: no deformities Skin:no rashes Neurological: no tremor with outstretched hands  Assessment:     1. DM2, uncontrolled, insulin-dependent, with probable complications - ? peripheral neuropathy  2. HL  3.  Overweight  4. PN  Plan:     1. Patient with history of type 2 diabetes, uncontrolled, on basal insulin and weekly GLP-1 receptor agonist, with improved control in the last year but higher blood sugars at last visit after coming off insulin for 3 weeks during traveling.  Sugars were at goal when on insulin, but higher when she was off.  HbA1c was 7.2%, stable.  We did not change her regimen but I advised her to continue taking both injectable medications. -Has a history of nausea, vomiting, and bloating initially on Ozempic but these resolved.  She had a gastric emptying study that showed delay gastric emptying, but this was checked while she was on Ozempic.  She does have constipation, for which she takes Linzess.  This is improved. -At today's visit, she returns that she could not obtain it from the pharmacy.  She finally restarted ~a week ago.   Sugars improved but she is only checking in the morning.  I advised her to also check later in the day.  For now, we will continue the same dose of Ozempic and I advised her to take a slightly higher dose of Lantus to improve her morning sugars. - I advised her to: Patient Instructions  Please use: - Lantus 28 units in a.m. - Ozempic 0.5 mg weekly in a.m.  Please return in 3 months with your sugar log.   - we checked her HbA1c: 8.4% (higher) - advised to check sugars at different times of the day - 1x a day, rotating check times - advised for yearly eye exams >> she is UTD - return to clinic in 3 months  2. HL -Reviewed her latest lipid panel from 03/2022: LDL very high, at 170, even higher than before, triglycerides also elevated: Lab Results  Component Value  Date   CHOL 243 (H) 04/07/2022   HDL 56.20 04/07/2022   LDLCALC 153 (H) 10/12/2021   LDLDIRECT 170.0 04/07/2022   TRIG 220.0 (H) 04/07/2022   CHOLHDL 4 04/07/2022  -She is on Crestor 40 mg daily without side effects  3.  Overweight -Will continue Ozempic which should also help with weight gain -She gained 1 pound before last visit -Weight stable at today's visit  4. PN -B12 level is normal at last check: Lab Results  Component Value Date   VITAMINB12 327 04/07/2022  -I again recommended that lipoic acid 600 mg twice a day - she did not start  Philemon Kingdom, MD PhD [facility] Endocrinology

## 2022-08-23 ENCOUNTER — Other Ambulatory Visit: Payer: Self-pay | Admitting: Neurology

## 2022-08-23 ENCOUNTER — Other Ambulatory Visit (HOSPITAL_COMMUNITY): Payer: Self-pay

## 2022-08-23 MED ORDER — GABAPENTIN 300 MG PO CAPS
600.0000 mg | ORAL_CAPSULE | Freq: Two times a day (BID) | ORAL | 3 refills | Status: DC
  Filled 2022-08-23: qty 120, 30d supply, fill #0
  Filled 2022-10-25: qty 120, 30d supply, fill #1

## 2022-08-25 ENCOUNTER — Other Ambulatory Visit (HOSPITAL_COMMUNITY): Payer: Self-pay

## 2022-08-30 ENCOUNTER — Telehealth: Payer: Self-pay | Admitting: Nurse Practitioner

## 2022-08-30 ENCOUNTER — Other Ambulatory Visit (HOSPITAL_COMMUNITY): Payer: Self-pay

## 2022-08-30 NOTE — Telephone Encounter (Signed)
PT was told she'd need a Cpap at last appt and no one has called her yet. Please call to advise @ [phone]

## 2022-08-30 NOTE — Telephone Encounter (Signed)
Order was placed 08/12/22 for pt to receive cpap. Routing to [facility] for assistance with this.

## 2022-08-31 ENCOUNTER — Other Ambulatory Visit (HOSPITAL_COMMUNITY): Payer: Self-pay

## 2022-09-01 ENCOUNTER — Other Ambulatory Visit (HOSPITAL_COMMUNITY): Payer: Self-pay

## 2022-09-01 ENCOUNTER — Telehealth: Payer: Self-pay

## 2022-09-01 NOTE — Telephone Encounter (Signed)
Pt contacted office to advise her pharmacy is insisting a prior auth for Ozempic is needed. Pt confirmed she has not changed insurance from last year with [employer]. I advised pt PA was good for a year for Ozempic.

## 2022-09-02 ENCOUNTER — Other Ambulatory Visit: Payer: Self-pay

## 2022-09-02 ENCOUNTER — Other Ambulatory Visit: Payer: Self-pay | Admitting: Neurology

## 2022-09-02 ENCOUNTER — Other Ambulatory Visit (HOSPITAL_COMMUNITY): Payer: Self-pay

## 2022-09-02 MED ORDER — PROPRANOLOL HCL 20 MG PO TABS
30.0000 mg | ORAL_TABLET | Freq: Two times a day (BID) | ORAL | 1 refills | Status: DC
  Filled 2022-09-02: qty 90, 30d supply, fill #0

## 2022-09-02 NOTE — Telephone Encounter (Signed)
Patient called in stating she was told we were out of network, but looking at patients insurance we accept [facility]. Patient also spoke with her insurance company who told her we were in network and would cover botox if we would submit a PA.

## 2022-09-02 NOTE — Telephone Encounter (Signed)
Noted  

## 2022-09-02 NOTE — Telephone Encounter (Signed)
I'm not sure what the issue was, but the pharmacy was able to fill it and she picked it up today.

## 2022-09-02 NOTE — Telephone Encounter (Signed)
Pt called back to advise she confirmed with pharmacy again and PA is needed.

## 2022-09-03 ENCOUNTER — Telehealth: Payer: Self-pay | Admitting: Pharmacy Technician

## 2022-09-03 ENCOUNTER — Other Ambulatory Visit (HOSPITAL_COMMUNITY): Payer: Self-pay

## 2022-09-03 MED ORDER — BOTOX 200 UNITS IJ SOLR: INTRAMUSCULAR | 4 refills | Status: DC

## 2022-09-03 NOTE — Telephone Encounter (Signed)
Pt is sch for 10-22-22

## 2022-09-03 NOTE — Telephone Encounter (Signed)
Received notification from [facility] regarding a prior authorization for BOTOX. Authorization has been APPROVED from 3.22.24 to 3.22.25.   Per test claim, copay for 90 days supply is $4  Patient can continue to fill through [place]: [phone]  Authorization # [id number]     Received notification from [place] that prior authorization for BOTOX 200U is required.   PA submitted on 3.22.24 Key [id number]  Status is pending

## 2022-09-07 ENCOUNTER — Other Ambulatory Visit (HOSPITAL_COMMUNITY): Payer: Self-pay

## 2022-09-17 NOTE — Progress Notes (Unsigned)
Rachel ScaleZach Adorian [name] D.O. [facility] [address] [place] [id number] Phone: [phone] Subjective:    I'm seeing this patient by the request  of:  Rachel [name], James W, MD  CC: Multiple joint complaints  OZH:[id number]:Subjective  Rachel [name] is a 57 y.o. female coming in with complaint of back and neck pain. OMT 08/05/2022. Patient states that she started to have pain through entire back shortly after last visit and pain is keeping her up at night. Does not feel like gabapentin has been working. Was using 2 pills BID.   Medications patient has been prescribed: Gabapentin  Taking:         Reviewed prior external information including notes and imaging from previsou exam, outside providers and external EMR if available.   As well as notes that were available from care everywhere and other healthcare systems.  Past medical history, social, surgical and family history all reviewed in electronic medical record.  No pertanent information unless stated regarding to the chief complaint.   Past Medical History:  Diagnosis Date   ALLERGIC RHINITIS 10/04/2007   Anemia 01/21/2011   Anxiety 11/15/2018   ASTHMA 08/02/2007   INHALERS ONLY IN [place]   Asthma 08/02/2007   Qualifier: Diagnosis of  By: Nena JordanYoo DO, D. Robert    Blood transfusion without reported diagnosis    with first child    Cervical radiculopathy 01/21/2011   Cervicogenic headache 04/27/2017   COMMON MIGRAINE 05/15/2009   Depression 01/09/2010   Qualifier: Diagnosis of  By: Jonny RuizJohn MD, Len BlalockJames W    DIABETES MELLITUS, TYPE II 08/02/2007   Dysphagia, pharyngoesophageal phase 06/12/2014   GERD 08/02/2007   HYPERLIPIDEMIA 08/02/2007   Hyperlipidemia 08/02/2007   Qualifier: Diagnosis of  By: Nena JordanYoo DO, D. Robert    INSOMNIA-SLEEP DISORDER-UNSPEC 01/04/2008   INTERMITTENT VERTIGO 05/15/2009   LIBIDO, DECREASED 01/09/2010   Migraine without aura 05/15/2009   Qualifier: Diagnosis of  By: Jonny RuizJohn MD, Len BlalockJames W    Nonallopathic lesion of rib cage  12/21/2017   Nonallopathic lesion of sacral region 07/29/2015   Nonallopathic lesion of thoracic region 07/29/2015   Osteoporosis 04/25/2019   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   Rheumatoid factor positive 02/10/2016   Right knee pain 01/31/2019   Injected January 31, 2019   Type 2 diabetes mellitus with hyperglycemia, with long-term current use of insulin ([facility]) 08/02/2007   Qualifier: Diagnosis of  By: Nena JordanYoo DO, D. Robert     Allergies  Allergen Reactions   Lipitor [Atorvastatin]     REACTION: myalgias   Nitroglycerin      Review of Systems:  No , visual changes, nausea, vomiting, diarrhea, constipation, dizziness, abdominal pain, skin rash, fevers, chills, night sweats, weight loss, swollen lymph nodes joint swelling, chest pain, shortness of breath, mood changes. POSITIVE muscle aches, body aches, headache  Objective  Blood pressure 106/70, pulse 74, height 5' (1.524 m), weight 143 lb (64.9 kg), SpO2 98 %.   General: No apparent distress alert and oriented x3 mood and affect normal, dressed appropriately.  HEENT: Pupils equal, extraocular movements intact  Respiratory: Patient's speak in full sentences and does not appear short of breath  Cardiovascular: No lower extremity edema, non tender, no erythema  Worsening tightness noted.  Seems to be everywhere.  Patient does have more voluntary guarding noted.  Neck exam does have some limited in sidebending bilaterally.  Osteopathic findings  C2 flexed rotated and side bent right C4 flexed rotated and side bent left C7 flexed rotated  and side bent left T3 extended rotated and side bent right inhaled rib T9 extended rotated and side bent left L3 flexed rotated and side bent right Sacrum right on right       Assessment and Plan:  Cervicogenic headache Worsening symptoms at this moment.  Discussed the potential for Toradol and Depo-Medrol.  Patient wanted to try the Zanaflex with patient having more difficulty at  night.  Was given prescription at this time.  Discussed with patient about icing regimen and home exercises.  Encouraged her to follow-up with neurology as well.  Follow-up with me again in 6 to 8 weeks    Nonallopathic problems  Decision today to treat with OMT was based on Physical Exam  After verbal consent patient was treated with HVLA, ME, FPR techniques in cervical, rib, thoracic, lumbar, and sacral  areas  Patient tolerated the procedure well with improvement in symptoms  Patient given exercises, stretches and lifestyle modifications  See medications in patient instructions if given  Patient will follow up in 4-8 weeks     The above documentation has been reviewed and is accurate and complete Judi Saa, DO         Note: This dictation was prepared with Dragon dictation along with smaller phrase technology. Any transcriptional errors that result from this process are unintentional.

## 2022-09-20 ENCOUNTER — Ambulatory Visit (INDEPENDENT_AMBULATORY_CARE_PROVIDER_SITE_OTHER): Payer: Medicare HMO | Admitting: Family Medicine

## 2022-09-20 ENCOUNTER — Other Ambulatory Visit (HOSPITAL_COMMUNITY): Payer: Self-pay

## 2022-09-20 ENCOUNTER — Encounter: Payer: Self-pay | Admitting: Family Medicine

## 2022-09-20 VITALS — BP 106/70 | HR 74 | Ht 60.0 in | Wt 143.0 lb

## 2022-09-20 DIAGNOSIS — M9908 Segmental and somatic dysfunction of rib cage: Secondary | ICD-10-CM

## 2022-09-20 DIAGNOSIS — M9903 Segmental and somatic dysfunction of lumbar region: Secondary | ICD-10-CM | POA: Diagnosis not present

## 2022-09-20 DIAGNOSIS — M9901 Segmental and somatic dysfunction of cervical region: Secondary | ICD-10-CM | POA: Diagnosis not present

## 2022-09-20 DIAGNOSIS — M9902 Segmental and somatic dysfunction of thoracic region: Secondary | ICD-10-CM | POA: Diagnosis not present

## 2022-09-20 DIAGNOSIS — G4486 Cervicogenic headache: Secondary | ICD-10-CM

## 2022-09-20 DIAGNOSIS — M9904 Segmental and somatic dysfunction of sacral region: Secondary | ICD-10-CM

## 2022-09-20 MED ORDER — TIZANIDINE HCL 4 MG PO TABS
4.0000 mg | ORAL_TABLET | Freq: Every day | ORAL | 0 refills | Status: DC
  Filled 2022-09-20: qty 30, 30d supply, fill #0

## 2022-09-20 NOTE — Assessment & Plan Note (Signed)
Worsening symptoms at this moment.  Discussed the potential for Toradol and Depo-Medrol.  Patient wanted to try the Zanaflex with patient having more difficulty at night.  Was given prescription at this time.  Discussed with patient about icing regimen and home exercises.  Encouraged her to follow-up with neurology as well.  Follow-up with me again in 6 to 8 weeks

## 2022-09-20 NOTE — Patient Instructions (Signed)
Zanaflex 4mg  prescribed BE active See you again in 6-8 weeks

## 2022-09-21 ENCOUNTER — Other Ambulatory Visit (HOSPITAL_COMMUNITY): Payer: Self-pay

## 2022-09-28 ENCOUNTER — Other Ambulatory Visit (HOSPITAL_COMMUNITY): Payer: Self-pay

## 2022-10-06 ENCOUNTER — Telehealth: Payer: Self-pay

## 2022-10-06 ENCOUNTER — Other Ambulatory Visit (HOSPITAL_COMMUNITY): Payer: Self-pay

## 2022-10-06 NOTE — Telephone Encounter (Signed)
PA request received via CMM for FreeStyle Libre 3 Sensor  PA has been submitted to [facility] and is pending additional questions/determination  Key : [id number]

## 2022-10-07 NOTE — Telephone Encounter (Signed)
Additional questions received and submitted for determination

## 2022-10-08 NOTE — Telephone Encounter (Signed)
Pharmacy Patient Advocate Encounter  Request for prior authorization has been denied due to:    Looks like the preferred device is dexcom

## 2022-10-12 ENCOUNTER — Other Ambulatory Visit (HOSPITAL_COMMUNITY): Payer: Self-pay

## 2022-10-12 ENCOUNTER — Other Ambulatory Visit: Payer: Self-pay | Admitting: Internal Medicine

## 2022-10-12 MED ORDER — DEXCOM G7 SENSOR MISC
3.0000 | 4 refills | Status: DC
  Filled 2022-10-12: qty 3, 30d supply, fill #0
  Filled 2022-10-25: qty 9, 90d supply, fill #0

## 2022-10-13 ENCOUNTER — Ambulatory Visit: Payer: No Typology Code available for payment source | Admitting: Internal Medicine

## 2022-10-18 ENCOUNTER — Other Ambulatory Visit (HOSPITAL_COMMUNITY): Payer: Self-pay

## 2022-10-18 ENCOUNTER — Ambulatory Visit (INDEPENDENT_AMBULATORY_CARE_PROVIDER_SITE_OTHER): Payer: Medicare HMO | Admitting: Internal Medicine

## 2022-10-18 VITALS — BP 124/78 | HR 94 | Temp 98.7°F | Ht 60.0 in | Wt 144.0 lb

## 2022-10-18 DIAGNOSIS — H6092 Unspecified otitis externa, left ear: Secondary | ICD-10-CM | POA: Insufficient documentation

## 2022-10-18 DIAGNOSIS — Z794 Long term (current) use of insulin: Secondary | ICD-10-CM | POA: Diagnosis not present

## 2022-10-18 DIAGNOSIS — R053 Chronic cough: Secondary | ICD-10-CM

## 2022-10-18 DIAGNOSIS — Z0001 Encounter for general adult medical examination with abnormal findings: Secondary | ICD-10-CM

## 2022-10-18 DIAGNOSIS — E538 Deficiency of other specified B group vitamins: Secondary | ICD-10-CM

## 2022-10-18 DIAGNOSIS — E559 Vitamin D deficiency, unspecified: Secondary | ICD-10-CM | POA: Diagnosis not present

## 2022-10-18 DIAGNOSIS — E1165 Type 2 diabetes mellitus with hyperglycemia: Secondary | ICD-10-CM | POA: Diagnosis not present

## 2022-10-18 DIAGNOSIS — E782 Mixed hyperlipidemia: Secondary | ICD-10-CM | POA: Diagnosis not present

## 2022-10-18 DIAGNOSIS — H60502 Unspecified acute noninfective otitis externa, left ear: Secondary | ICD-10-CM | POA: Diagnosis not present

## 2022-10-18 DIAGNOSIS — R002 Palpitations: Secondary | ICD-10-CM | POA: Diagnosis not present

## 2022-10-18 LAB — HEPATIC FUNCTION PANEL
ALT: 12 U/L (ref 0–35)
AST: 14 U/L (ref 0–37)
Albumin: 4.2 g/dL (ref 3.5–5.2)
Alkaline Phosphatase: 61 U/L (ref 39–117)
Bilirubin, Direct: 0.1 mg/dL (ref 0.0–0.3)
Total Bilirubin: 0.3 mg/dL (ref 0.2–1.2)
Total Protein: 6.9 g/dL (ref 6.0–8.3)

## 2022-10-18 LAB — BASIC METABOLIC PANEL
BUN: 10 mg/dL (ref 6–23)
CO2: 29 mEq/L (ref 19–32)
Calcium: 9 mg/dL (ref 8.4–10.5)
Chloride: 102 mEq/L (ref 96–112)
Creatinine, Ser: 0.73 mg/dL (ref 0.40–1.20)
GFR: 90.66 mL/min (ref >60.00)
Glucose, Bld: 175 mg/dL — ABNORMAL HIGH (ref 70–99)
Potassium: 3.8 mEq/L (ref 3.5–5.1)
Sodium: 138 mEq/L (ref 135–145)

## 2022-10-18 LAB — CBC WITH DIFFERENTIAL/PLATELET
Basophils Absolute: 0.1 10*3/uL (ref 0.0–0.1)
Basophils Relative: 0.8 % (ref 0.0–3.0)
Eosinophils Absolute: 0.1 10*3/uL (ref 0.0–0.7)
Eosinophils Relative: 1.4 % (ref 0.0–5.0)
HCT: 36.1 % (ref 36.0–46.0)
Hemoglobin: 12 g/dL (ref 12.0–15.0)
Lymphocytes Relative: 52.4 % — ABNORMAL HIGH (ref 12.0–46.0)
Lymphs Abs: 4.1 10*3/uL — ABNORMAL HIGH (ref 0.7–4.0)
MCHC: 33.3 g/dL (ref 30.0–36.0)
MCV: 84.2 fl (ref 78.0–100.0)
Monocytes Absolute: 0.6 10*3/uL (ref 0.1–1.0)
Monocytes Relative: 7.9 % (ref 3.0–12.0)
Neutro Abs: 2.9 10*3/uL (ref 1.4–7.7)
Neutrophils Relative %: 37.5 % — ABNORMAL LOW (ref 43.0–77.0)
Platelets: 368 10*3/uL (ref 150.0–400.0)
RBC: 4.29 Mil/uL (ref 3.87–5.11)
RDW: 13.3 % (ref 11.5–15.5)
WBC: 7.8 10*3/uL (ref 4.0–10.5)

## 2022-10-18 LAB — LIPID PANEL
Cholesterol: 147 mg/dL (ref 0–200)
HDL: 56.8 mg/dL (ref >39.00)
LDL Cholesterol: 57 mg/dL (ref 0–99)
NonHDL: 90.07
Total CHOL/HDL Ratio: 3
Triglycerides: 164 mg/dL — ABNORMAL HIGH (ref 0.0–149.0)
VLDL: 32.8 mg/dL (ref 0.0–40.0)

## 2022-10-18 LAB — VITAMIN B12: Vitamin B-12: 356 pg/mL (ref 211–911)

## 2022-10-18 LAB — MICROALBUMIN / CREATININE URINE RATIO
Creatinine,U: 180 mg/dL
Microalb Creat Ratio: 0.8 mg/g (ref 0.0–30.0)
Microalb, Ur: 1.5 mg/dL (ref 0.0–1.9)

## 2022-10-18 LAB — TSH: TSH: 0.7 u[IU]/mL (ref 0.35–5.50)

## 2022-10-18 LAB — VITAMIN D 25 HYDROXY (VIT D DEFICIENCY, FRACTURES): VITD: 40.89 ng/mL (ref 30.00–100.00)

## 2022-10-18 MED ORDER — NEOMYCIN-POLYMYXIN-HC 3.5-10000-1 OT SOLN
4.0000 [drp] | Freq: Four times a day (QID) | OTIC | 0 refills | Status: DC
  Filled 2022-10-18: qty 10, 13d supply, fill #0

## 2022-10-18 MED ORDER — PREDNISONE 10 MG PO TABS
ORAL_TABLET | ORAL | 0 refills | Status: DC
  Filled 2022-10-18: qty 18, 9d supply, fill #0

## 2022-10-18 MED ORDER — PROPRANOLOL HCL 20 MG PO TABS: 30.0000 mg | ORAL_TABLET | Freq: Every day | ORAL | 1 refills | Status: DC

## 2022-10-18 MED ORDER — TRELEGY ELLIPTA 100-62.5-25 MCG/ACT IN AEPB
1.0000 | INHALATION_SPRAY | Freq: Every day | RESPIRATORY_TRACT | 11 refills | Status: DC
  Filled 2022-10-18: qty 60, 30d supply, fill #0

## 2022-10-18 NOTE — Assessment & Plan Note (Signed)
Ongoing for 4 mo, exam benign except for decreased BS bilat - suspect asthma related, for cxr, prednisone taper, and trelegy 1 qd

## 2022-10-18 NOTE — Assessment & Plan Note (Signed)
Age and sex appropriate education and counseling updated with regular exercise and diet Referrals for preventative services - declines colonoscopy Immunizations addressed - for shingrix at pharmacy Smoking counseling  - none needed Evidence for depression or other mood disorder - stable depression, declines need for change Most recent labs reviewed. I have personally reviewed and have noted: 1) the patient's medical and social history 2) The patient's current medications and supplements 3) The patient's height, weight, and BMI have been recorded in the chart

## 2022-10-18 NOTE — Patient Instructions (Addendum)
Please take all new medication as prescribed - the ear drops, and the new inhaler Trelegy, and prednisone  Your EKG was done today  Please continue all other medications as before, and refills have been done if requested.  Please have the pharmacy call with any other refills you may need.  Please continue your efforts at being more active, low cholesterol diet, and weight control.  You are otherwise up to date with prevention measures today.  Please keep your appointments with your specialists as you may have planned  You will be contacted regarding the referral for: Heart monitor, and Cardiology  Please go to the XRAY Department in the first floor for the x-ray testing  Please go to the LAB at the blood drawing area for the tests to be done  You will be contacted by phone if any changes need to be made immediately.  Otherwise, you will receive a letter about your results with an explanation, but please check with MyChart first.  Please remember to sign up for MyChart if you have not done so, as this will be important to you in the future with finding out test results, communicating by private email, and scheduling acute appointments online when needed.  Please make an Appointment to return in 6 months, or sooner if needed, also with Lab Appointment for testing done 3-5 days before at the FIRST FLOOR Lab (so this is for TWO appointments - please see the scheduling desk as you leave)

## 2022-10-18 NOTE — Assessment & Plan Note (Addendum)
Etiology unclear, can't r/o afib possiblely exac by asthma, for card event monitor, Ecg today reviewed, refer card per pt request

## 2022-10-18 NOTE — Progress Notes (Signed)
Patient ID: Rachel [name], female   DOB: [birth year], 57 y.o.   MRN: [id number]         Chief Complaint:: wellness exam and cough asthma, left ear pain, palpitations, hld       HPI:  Rachel [name] is a 57 y.o. female here for wellness exam, for shingrix at pharmacy, declines colonoscopy, o/w up to date                        Also has mild worsening sob with non prod cough for 3-4 mo, sometimes assoc with palpitations on exertion and sometimes at rest.  Also has left ear pain and tender to lay on left side at night.  Pt denies chest pain, wheezing, orthopnea, PND, increased LE swelling, dizziness or syncope. Pt denies polydipsia, polyuria, or new focal neuro s/s.    Pt denies fever, wt loss, night sweats, loss of appetite, or other constitutional symptoms     Wt Readings from Last 3 Encounters:  10/18/22 144 lb (65.3 kg)  09/20/22 143 lb (64.9 kg)  08/13/22 143 lb 12.8 oz (65.2 kg)   BP Readings from Last 3 Encounters:  10/18/22 124/78  09/20/22 106/70  08/13/22 112/80   Immunization History  Administered Date(s) Administered   H1N1 04/15/2008   Influenza Whole 04/22/2008, 03/14/2010   Influenza,inj,Quad PF,6+ Mos 03/13/2014, 02/28/2019, 02/26/2020   Influenza-Unspecified 03/09/2017, 02/14/2018   Moderna Sars-Covid-2 Vaccination 04/25/2020, 06/03/2020   Pneumococcal Conjugate-13 07/09/2015   Pneumococcal Polysaccharide-23 07/08/2008, 06/25/2016   Td 10/03/2006   Tdap 06/25/2016   Health Maintenance Due  Topic Date Due   Medicare Annual Wellness (AWV)  Never done      Past Medical History:  Diagnosis Date   ALLERGIC RHINITIS 10/04/2007   Anemia 01/21/2011   Anxiety 11/15/2018   ASTHMA 08/02/2007   INHALERS ONLY IN [place]   Asthma 08/02/2007   Qualifier: Diagnosis of  By: Nena Jordan    Blood transfusion without reported diagnosis    with first child    Cervical radiculopathy 01/21/2011   Cervicogenic headache 04/27/2017   COMMON MIGRAINE 05/15/2009   Depression  01/09/2010   Qualifier: Diagnosis of  By: Jonny Ruiz MD, Len Blalock    DIABETES MELLITUS, TYPE II 08/02/2007   Dysphagia, pharyngoesophageal phase 06/12/2014   GERD 08/02/2007   HYPERLIPIDEMIA 08/02/2007   Hyperlipidemia 08/02/2007   Qualifier: Diagnosis of  By: Nena Jordan    INSOMNIA-SLEEP DISORDER-UNSPEC 01/04/2008   INTERMITTENT VERTIGO 05/15/2009   LIBIDO, DECREASED 01/09/2010   Migraine without aura 05/15/2009   Qualifier: Diagnosis of  By: Jonny Ruiz MD, Len Blalock    Nonallopathic lesion of rib cage 12/21/2017   Nonallopathic lesion of sacral region 07/29/2015   Nonallopathic lesion of thoracic region 07/29/2015   Osteoporosis 04/25/2019   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   Rheumatoid factor positive 02/10/2016   Right knee pain 01/31/2019   Injected January 31, 2019   Type 2 diabetes mellitus with hyperglycemia, with long-term current use of insulin (HCC) 08/02/2007   Qualifier: Diagnosis of  By: Nena Jordan    Past Surgical History:  Procedure Laterality Date   CESAREAN SECTION     x 3   CYST EXCISION     left knee   PAROTIDECTOMY  10/21/2011   Procedure: PAROTIDECTOMY;  Surgeon: Christia Reading, MD;  Location: [facility] OR;  Service: ENT;  Laterality: Left;    reports that she has never smoked. She has  never used smokeless tobacco. She reports that she does not drink alcohol and does not use drugs. family history includes Asthma in her father; Cervical cancer in her maternal aunt; Diabetes in her father; Heart disease in her maternal aunt; Hypertension in her father. Allergies  Allergen Reactions   Lipitor [Atorvastatin]     REACTION: myalgias   Nitroglycerin    Current Outpatient Medications on File Prior to Visit  Medication Sig Dispense Refill   Accu-Chek Softclix Lancets lancets Use as instructed to check blood sugar 2 times daily 100 each 0   aspirin 81 MG EC tablet Take 81 mg by mouth daily.     Blood Glucose Monitoring Suppl (ACCU-CHEK GUIDE) w/Device KIT Use as  instructed to check blood sugar 2 times daily 1 kit 0   botulinum toxin Type A (BOTOX) 200 units injection Inject 155 units IM Into multiple sites in the face,neck,and head every 90 days 1 each 4   butalbital-acetaminophen-caffeine (FIORICET) 50-325-40 MG tablet Take 1 tablet by mouth every 6 (six) hours as needed for headache. Max 2 tablets per day 30 tablet 3   Cholecalciferol (VITAMIN D) 50 MCG (2000 UT) CAPS Take 4,000 Units by mouth daily.     Continuous Glucose Sensor (DEXCOM G7 SENSOR) MISC Apply 1 sensor every 10 days 9 each 4   Diclofenac Sodium (PENNSAID) 2 % SOLN Place 2 g onto the skin 2 (two) times daily. 112 g 3   DULoxetine (CYMBALTA) 60 MG capsule Take 1 capsule (60 mg total) by mouth daily. 90 capsule 3   esomeprazole (NEXIUM) 40 MG capsule Take 1 capsule (40 mg total) by mouth 2 (two) times daily before a meal. 180 capsule 3   gabapentin (NEURONTIN) 300 MG capsule Take 2 capsules (600 mg total) by mouth 2 (two) times daily. 120 capsule 3   glucose blood (ACCU-CHEK GUIDE) test strip Use to check blood sugar 2 (two) times daily as directed 200 each 3   insulin glargine-yfgn (SEMGLEE) 100 UNIT/ML Pen Inject 30 Units into the skin daily. 30 mL 3   linaclotide (LINZESS) 145 MCG CAPS capsule Take 1 capsule (145 mcg total) by mouth daily before breakfast 30 capsule 2   metoCLOPramide (REGLAN) 5 MG tablet Take 1 tablet (5 mg total) by mouth 3 (three) times daily 30 minutes before meals 90 tablet 1   Multiple Vitamin (MULTIVITAMIN WITH MINERALS) TABS tablet Take 1 tablet by mouth daily.     ondansetron (ZOFRAN ODT) 8 MG disintegrating tablet Take 1 tablet (8 mg total) by mouth every 8 (eight) hours as needed for nausea or vomiting. 30 tablet 0   ondansetron (ZOFRAN) 4 MG tablet Take 1 tablet (4 mg total) by mouth every 8 (eight) hours as needed for nausea & vomiting 40 tablet 1   rosuvastatin (CRESTOR) 20 MG tablet Take 1 tablet (20 mg total) by mouth daily. 90 tablet 3   Semaglutide,0.25  or 0.5MG /DOS, (OZEMPIC, 0.25 OR 0.5 MG/DOSE,) 2 MG/3ML SOPN Inject 0.5 mg into the skin once a week. 9 mL 3   tiZANidine (ZANAFLEX) 4 MG tablet Take 1 tablet (4 mg total) by mouth every 8 (eight) hours as needed for muscle spasms. 90 tablet 5   tiZANidine (ZANAFLEX) 4 MG tablet Take 1 tablet (4 mg total) by mouth at bedtime. 30 tablet 0   triamcinolone (NASACORT) 55 MCG/ACT AERO nasal inhaler Place 2 sprays into the nose daily. 1 Inhaler 12   Ubrogepant (UBRELVY) 100 MG TABS Take 1 tablet by mouth as  needed. May repeat after 2 hours.  Maximum 2 tablets in 24 hours. 10 tablet 11   No current facility-administered medications on file prior to visit.        ROS:  All others reviewed and negative.  Objective        PE:  BP 124/78 (BP Location: Right Arm, Patient Position: Sitting, Cuff Size: Normal)   Pulse 94   Temp 98.7 F (37.1 C) (Oral)   Ht 5' (1.524 m)   Wt 144 lb (65.3 kg)   SpO2 100%   BMI 28.12 kg/m                 Constitutional: Pt appears in NAD               HENT: Head: NCAT.                Right Ear: External ear normal.                 Left Ear: External ear normal. Canal with 1+ red, swelling               Eyes: . Pupils are equal, round, and reactive to light. Conjunctivae and EOM are normal               Nose: without d/c or deformity               Neck: Neck supple. Gross normal ROM               Cardiovascular: Normal rate and regular rhythm.                 Pulmonary/Chest: Effort normal and breath sounds decreased  without rales or wheezing.                Abd:  Soft, NT, ND, + BS, no organomegaly               Neurological: Pt is alert. At baseline orientation, motor grossly intact               Skin: Skin is warm. No rashes, no other new lesions, LE edema - none               Psychiatric: Pt behavior is normal without agitation   Micro: none  Cardiac tracings I have personally interpreted today:    Pertinent Radiological findings (summarize): none   Lab  Results  Component Value Date   WBC 7.4 04/07/2022   HGB 11.1 (L) 04/07/2022   HCT 33.8 (L) 04/07/2022   PLT 340.0 04/07/2022   GLUCOSE 114 (H) 04/07/2022   CHOL 243 (H) 04/07/2022   TRIG 220.0 (H) 04/07/2022   HDL 56.20 04/07/2022   LDLDIRECT 170.0 04/07/2022   LDLCALC 153 (H) 10/12/2021   ALT 13 04/07/2022   AST 16 04/07/2022   NA 136 04/07/2022   K 4.1 04/07/2022   CL 102 04/07/2022   CREATININE 0.68 04/07/2022   BUN 11 04/07/2022   CO2 28 04/07/2022   TSH 1.23 04/07/2022   INR 1.08 07/23/2009   HGBA1C 8.4 (A) 08/13/2022   MICROALBUR 1.1 04/07/2022   POCT - ECG - NSR 91  Assessment/Plan:  Shaterra Caris is a 57 y.o. Other or two or more races [6] female with  has a past medical history of ALLERGIC RHINITIS (10/04/2007), Anemia (01/21/2011), Anxiety (11/15/2018), ASTHMA (08/02/2007), Asthma (08/02/2007), Blood transfusion without reported diagnosis, Cervical radiculopathy (01/21/2011), Cervicogenic headache (04/27/2017), COMMON MIGRAINE (05/15/2009),  Depression (01/09/2010), DIABETES MELLITUS, TYPE II (08/02/2007), Dysphagia, pharyngoesophageal phase (06/12/2014), GERD (08/02/2007), HYPERLIPIDEMIA (08/02/2007), Hyperlipidemia (08/02/2007), INSOMNIA-SLEEP DISORDER-UNSPEC (01/04/2008), INTERMITTENT VERTIGO (05/15/2009), LIBIDO, DECREASED (01/09/2010), Migraine without aura (05/15/2009), Nonallopathic lesion of rib cage (12/21/2017), Nonallopathic lesion of sacral region (07/29/2015), Nonallopathic lesion of thoracic region (07/29/2015), Osteoporosis (04/25/2019), Patellofemoral syndrome of both knees (04/25/2019), Rheumatoid factor positive (02/10/2016), Right knee pain (01/31/2019), and Type 2 diabetes mellitus with hyperglycemia, with long-term current use of insulin (HCC) (08/02/2007).  Encounter for well adult exam with abnormal findings Age and sex appropriate education and counseling updated with regular exercise and diet Referrals for preventative services - declines colonoscopy Immunizations  addressed - for shingrix at pharmacy Smoking counseling  - none needed Evidence for depression or other mood disorder - stable depression, declines need for change Most recent labs reviewed. I have personally reviewed and have noted: 1) the patient's medical and social history 2) The patient's current medications and supplements 3) The patient's height, weight, and BMI have been recorded in the chart   Hyperlipidemia Lab Results  Component Value Date   LDLCALC 153 (H) 10/12/2021   Uncontrolled, encouraged med compliance, pt to continue current statin crestor 20 mg qd, and f/u lab   Type 2 diabetes mellitus with hyperglycemia, with long-term current use of insulin (HCC) Lab Results  Component Value Date   HGBA1C 8.4 (A) 08/13/2022   Uncontrolled, pt to continue current medical treatment semglee 30 u qd, ozempic 0.5 wkly    Chronic cough Ongoing for 4 mo, exam benign except for decreased BS bilat - suspect asthma related, for cxr, prednisone taper, and trelegy 1 qd  Palpitations Etiology unclear, can't r/o afib possiblely exac by asthma, for card event monitor, Ecg today reviewed  Followup: Return in about 6 months (around 04/20/2023).  Oliver Barre, MD 10/18/2022 9:10 PM [facility]

## 2022-10-18 NOTE — Assessment & Plan Note (Signed)
Lab Results  Component Value Date   HGBA1C 8.4 (A) 08/13/2022   Uncontrolled, pt to continue current medical treatment semglee 30 u qd, ozempic 0.5 wkly

## 2022-10-18 NOTE — Assessment & Plan Note (Signed)
Lab Results  Component Value Date   LDLCALC 153 (H) 10/12/2021   Uncontrolled, encouraged med compliance, pt to continue current statin crestor 20 mg qd, and f/u lab

## 2022-10-19 LAB — URINALYSIS, ROUTINE W REFLEX MICROSCOPIC
Bilirubin Urine: NEGATIVE
Hgb urine dipstick: NEGATIVE
Leukocytes,Ua: NEGATIVE
Nitrite: NEGATIVE
Specific Gravity, Urine: 1.025 (ref 1.000–1.030)
Total Protein, Urine: NEGATIVE
Urine Glucose: 500 — AB
Urobilinogen, UA: 0.2 (ref 0.0–1.0)
pH: 6.5 (ref 5.0–8.0)

## 2022-10-19 LAB — HEMOGLOBIN A1C: Hgb A1c MFr Bld: 8.3 % — ABNORMAL HIGH (ref 4.6–6.5)

## 2022-10-19 NOTE — Progress Notes (Unsigned)
[name] [address] [place] [address] Phone: [phone] Subjective:   [name], am serving as a scribe for Dr. Antoine Primas.  I'm seeing this patient by the request  of:  Corwin Levins, MD  CC: Back and neck pain follow-up  [id number]  Rachel [name] is a 57 y.o. female coming in with complaint of back and neck pain. OMT 09/20/2022. Patient states doing well. Would like injection in knee as well. No new concerns.  Has been doing relatively well overall.  Has had more pain now in the knees.  States that they are a dull, throbbing aching sensation and sometimes has some instability.  Medications patient has been prescribed: Zanaflex  Taking: Yes         Reviewed prior external information including notes and imaging from previsou exam, outside providers and external EMR if available.   As well as notes that were available from care everywhere and other healthcare systems.  Past medical history, social, surgical and family history all reviewed in electronic medical record.  No pertanent information unless stated regarding to the chief complaint.   Past Medical History:  Diagnosis Date   ALLERGIC RHINITIS 10/04/2007   Anemia 01/21/2011   Anxiety 11/15/2018   ASTHMA 08/02/2007   INHALERS ONLY IN [place]   Asthma 08/02/2007   Qualifier: Diagnosis of  By: Nena Jordan    Blood transfusion without reported diagnosis    with first child    Cervical radiculopathy 01/21/2011   Cervicogenic headache 04/27/2017   COMMON MIGRAINE 05/15/2009   Depression 01/09/2010   Qualifier: Diagnosis of  By: Jonny Ruiz MD, Len Blalock    DIABETES MELLITUS, TYPE II 08/02/2007   Dysphagia, pharyngoesophageal phase 06/12/2014   GERD 08/02/2007   HYPERLIPIDEMIA 08/02/2007   Hyperlipidemia 08/02/2007   Qualifier: Diagnosis of  By: Nena Jordan    INSOMNIA-SLEEP DISORDER-UNSPEC 01/04/2008   INTERMITTENT VERTIGO 05/15/2009   LIBIDO, DECREASED 01/09/2010    Migraine without aura 05/15/2009   Qualifier: Diagnosis of  By: Jonny Ruiz MD, Len Blalock    Nonallopathic lesion of rib cage 12/21/2017   Nonallopathic lesion of sacral region 07/29/2015   Nonallopathic lesion of thoracic region 07/29/2015   Osteoporosis 04/25/2019   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   Rheumatoid factor positive 02/10/2016   Right knee pain 01/31/2019   Injected January 31, 2019   Type 2 diabetes mellitus with hyperglycemia, with long-term current use of insulin (HCC) 08/02/2007   Qualifier: Diagnosis of  By: Nena Jordan     Allergies  Allergen Reactions   Lipitor [Atorvastatin]     REACTION: myalgias   Nitroglycerin      Review of Systems:  No headache, visual changes, nausea, vomiting, diarrhea, constipation, dizziness, abdominal pain, skin rash, fevers, chills, night sweats, weight loss, swollen lymph nodes, body aches, joint swelling, chest pain, shortness of breath, mood changes. POSITIVE muscle aches  Objective  Blood pressure 116/80, pulse 90, height 5' (1.524 m), weight 141 lb (64 kg), SpO2 98 %.   General: No apparent distress alert and oriented x3 mood and affect normal, dressed appropriately.  HEENT: Pupils equal, extraocular movements intact  Respiratory: Patient's speak in full sentences and does not appear short of breath  Cardiovascular: No lower extremity edema, non tender, no erythema  Antalgic gait noted.  Favoring the knees.  Patient does have tenderness over the knees that is out of proportion to the amount of palpation.  Osteopathic findings  C3 flexed rotated and side bent right C6 flexed rotated and side bent left T3 extended rotated and side bent right inhaled rib T8 extended rotated and side bent left L2 flexed rotated and side bent right Sacrum right on right  After informed written and verbal consent, patient was seated on exam table. Right knee was prepped with alcohol swab and utilizing anterolateral approach, patient's  right knee space was injected with 4:1  marcaine 0.5%: Kenalog 40mg /dL. Patient tolerated the procedure well without immediate complications.  After informed written and verbal consent, patient was seated on exam table. Left knee was prepped with alcohol swab and utilizing anterolateral approach, patient's left knee space was injected with 4:1  marcaine 0.5%: Kenalog 40mg /dL. Patient tolerated the procedure well without immediate complications.      Assessment and Plan:  Degenerative arthritis of knee, bilateral Chronic problem with worsening symptoms.  Still wants to avoid any type of surgical intervention.  Do believe that we can.  Do feel that there is some underlying arthritic changes as well as underlying polyarthralgia that is contributing.  We discussed other medications.  He still does not feel that Cymbalta and the Zanaflex is making significant difference at the moment.  We discussed the potential change to vilazaodone follow-up again in 6 to 8 weeks    Nonallopathic problems  Decision today to treat with OMT was based on Physical Exam  After verbal consent patient was treated with HVLA, ME, FPR techniques in cervical, rib, thoracic, lumbar, and sacral  areas  Patient tolerated the procedure well with improvement in symptoms  Patient given exercises, stretches and lifestyle modifications  See medications in patient instructions if given  Patient will follow up in 4-8 weeks     The above documentation has been reviewed and is accurate and complete Judi Saa, DO         Note: This dictation was prepared with Dragon dictation along with smaller phrase technology. Any transcriptional errors that result from this process are unintentional.

## 2022-10-20 ENCOUNTER — Ambulatory Visit (INDEPENDENT_AMBULATORY_CARE_PROVIDER_SITE_OTHER): Payer: Medicare HMO | Admitting: Family Medicine

## 2022-10-20 ENCOUNTER — Other Ambulatory Visit (HOSPITAL_COMMUNITY): Payer: Self-pay

## 2022-10-20 VITALS — BP 116/80 | HR 90 | Ht 60.0 in | Wt 141.0 lb

## 2022-10-20 DIAGNOSIS — M9908 Segmental and somatic dysfunction of rib cage: Secondary | ICD-10-CM | POA: Diagnosis not present

## 2022-10-20 DIAGNOSIS — M9904 Segmental and somatic dysfunction of sacral region: Secondary | ICD-10-CM

## 2022-10-20 DIAGNOSIS — M9902 Segmental and somatic dysfunction of thoracic region: Secondary | ICD-10-CM

## 2022-10-20 DIAGNOSIS — G4486 Cervicogenic headache: Secondary | ICD-10-CM

## 2022-10-20 DIAGNOSIS — G63 Polyneuropathy in diseases classified elsewhere: Secondary | ICD-10-CM

## 2022-10-20 DIAGNOSIS — M170 Bilateral primary osteoarthritis of knee: Secondary | ICD-10-CM | POA: Diagnosis not present

## 2022-10-20 DIAGNOSIS — M9901 Segmental and somatic dysfunction of cervical region: Secondary | ICD-10-CM

## 2022-10-20 DIAGNOSIS — M9903 Segmental and somatic dysfunction of lumbar region: Secondary | ICD-10-CM

## 2022-10-20 NOTE — Patient Instructions (Addendum)
Injection in knees today Good to see you! Very tight today See you again in 6-8 weeks

## 2022-10-20 NOTE — Assessment & Plan Note (Signed)
Chronic problem with worsening symptoms.  Does respond well to osteopathic relation.  Discussed the Zanaflex still at night.  Discussed posture and ergonomics.

## 2022-10-20 NOTE — Assessment & Plan Note (Signed)
Chronic problem with worsening symptoms.  Still wants to avoid any type of surgical intervention.  Do believe that we can.  Do feel that there is some underlying arthritic changes as well as underlying polyarthralgia that is contributing.  We discussed other medications.  He still does not feel that Cymbalta and the Zanaflex is making significant difference at the moment.  We discussed the potential change to vilazaodone follow-up again in 6 to 8 weeks

## 2022-10-22 ENCOUNTER — Ambulatory Visit (INDEPENDENT_AMBULATORY_CARE_PROVIDER_SITE_OTHER): Payer: Medicare HMO | Admitting: Neurology

## 2022-10-22 DIAGNOSIS — R002 Palpitations: Secondary | ICD-10-CM

## 2022-10-22 DIAGNOSIS — G43709 Chronic migraine without aura, not intractable, without status migrainosus: Secondary | ICD-10-CM | POA: Diagnosis not present

## 2022-10-22 MED ORDER — ONABOTULINUMTOXINA 100 UNITS IJ SOLR
200.0000 [IU] | Freq: Once | INTRAMUSCULAR | Status: AC
Start: 2022-10-22 — End: 2022-10-22
  Administered 2022-10-22: 155 [IU] via INTRAMUSCULAR

## 2022-10-22 NOTE — Progress Notes (Unsigned)
[facility]  ° °Procedure Note Botox ° °Attending: Dr. Jashley Yellin ° °Preoperative Diagnosis(es): Chronic migraine ° °Consent obtained from: The patient °Benefits discussed included, but were not limited to decreased muscle tightness, increased joint range of motion, and decreased pain.  Risk discussed included, but were not limited pain and discomfort, bleeding, bruising, excessive weakness, venous thrombosis, muscle atrophy and dysphagia.  Anticipated outcomes of the procedure as well as he risks and benefits of the alternatives to the procedure, and the roles and tasks of the personnel to be involved, were discussed with the patient, and the patient consents to the procedure and agrees to proceed. A copy of the patient medication guide was given to the patient which explains the blackbox warning. ° °Patients identity and treatment sites confirmed Yes.  . ° °Details of Procedure: °Skin was cleaned with alcohol. Prior to injection, the needle plunger was aspirated to make sure the needle was not within a blood vessel.  There was no blood retrieved on aspiration.   ° °Following is a summary of the muscles injected  And the amount of Botulinum toxin used: ° °Dilution °200 units of Botox was reconstituted with 4 ml of preservative free normal saline. °Time of reconstitution: At the time of the office visit (<30 minutes prior to injection)  ° °Injections  °155 total units of Botox was injected with a 30 gauge needle. ° °Injection Sites: °L occipitalis: 15 units- 3 sites  °R occiptalis: 15 units- 3 sites ° °L upper trapezius: 15 units- 3 sites °R upper trapezius: 15 units- 3 sits          °L paraspinal: 10 units- 2 sites °R paraspinal: 10 units- 2 sites ° °Face °L frontalis(2 injection sites):10 units   °R frontalis(2 injection sites):10 units         °L corrugator: 5 units   °R corrugator: 5 units           °Procerus: 5 units   °L temporalis: 20 units °R temporalis: 20 units  ° °Agent:  °200 units of botulinum Type  A (Onobotulinum Toxin type A) was reconstituted with 4 ml of preservative free normal saline.  °Time of reconstitution: At the time of the office visit (<30 minutes prior to injection)  ° ° ° Total injected (Units):  155 ° Total wasted (Units):  45 ° °Patient tolerated procedure well without complications.   °Reinjection is anticipated in 3 months. ° ° °

## 2022-10-25 ENCOUNTER — Other Ambulatory Visit (HOSPITAL_COMMUNITY): Payer: Self-pay

## 2022-10-26 ENCOUNTER — Other Ambulatory Visit: Payer: Self-pay

## 2022-10-26 ENCOUNTER — Other Ambulatory Visit (HOSPITAL_COMMUNITY): Payer: Self-pay

## 2022-10-26 ENCOUNTER — Telehealth: Payer: Self-pay

## 2022-10-26 ENCOUNTER — Other Ambulatory Visit (HOSPITAL_BASED_OUTPATIENT_CLINIC_OR_DEPARTMENT_OTHER): Payer: Self-pay

## 2022-10-26 NOTE — Telephone Encounter (Signed)
Patient Advocate Encounter   Received notification from [facility] that prior authorization is required for Dexcom G7 sensor  Submitted: 10/26/22 Key [id number]  Status is pending

## 2022-10-26 NOTE — Telephone Encounter (Signed)
Patient Advocate Encounter  Prior Authorization for [employer] has been approved through [facility].  KEY: [id number]    Effective: 10-26-2022 to 2-31-2024

## 2022-11-02 ENCOUNTER — Ambulatory Visit: Payer: Medicaid Other

## 2022-11-05 ENCOUNTER — Other Ambulatory Visit: Payer: Self-pay

## 2022-11-05 DIAGNOSIS — E1165 Type 2 diabetes mellitus with hyperglycemia: Secondary | ICD-10-CM

## 2022-11-09 ENCOUNTER — Telehealth: Payer: Self-pay

## 2022-11-09 MED ORDER — UBRELVY 100 MG PO TABS: 1.0000 | ORAL_TABLET | ORAL | 11 refills | Status: DC | PRN

## 2022-11-09 MED ORDER — GABAPENTIN 300 MG PO CAPS: 600.0000 mg | ORAL_CAPSULE | Freq: Two times a day (BID) | ORAL | 3 refills | Status: DC

## 2022-11-09 NOTE — Telephone Encounter (Signed)
Fax received,Medication Request for Ubrelvy 100 mg, and Gabapentin 300 mg.    LMOVM for patient has insurance changed. If so Please let us know on the next botox we will have the right PA for it. We do not want top delay delivery and injection of the Botox.

## 2022-11-12 ENCOUNTER — Telehealth: Payer: Self-pay | Admitting: Family Medicine

## 2022-11-12 ENCOUNTER — Other Ambulatory Visit: Payer: Self-pay

## 2022-11-12 ENCOUNTER — Telehealth: Payer: Self-pay | Admitting: Internal Medicine

## 2022-11-12 MED ORDER — PROPRANOLOL HCL 20 MG PO TABS: 30.0000 mg | ORAL_TABLET | Freq: Every day | ORAL | 1 refills | Status: DC

## 2022-11-12 MED ORDER — VITAMIN D 50 MCG (2000 UT) PO CAPS: 4000.0000 [IU] | ORAL_CAPSULE | Freq: Every day | ORAL | 3 refills | Status: DC

## 2022-11-12 MED ORDER — TRELEGY ELLIPTA 100-62.5-25 MCG/ACT IN AEPB: 1.0000 | INHALATION_SPRAY | Freq: Every day | RESPIRATORY_TRACT | 11 refills | Status: DC

## 2022-11-12 MED ORDER — TIZANIDINE HCL 4 MG PO TABS: 4.0000 mg | ORAL_TABLET | Freq: Every day | ORAL | 0 refills | Status: DC

## 2022-11-12 NOTE — Telephone Encounter (Signed)
Prescription Request  11/12/2022  LOV: 10/18/2022  What is the name of the medication or equipment?  propranolol (INDERAL) 20 MG tablet    Fluticasone-Umeclidin-Vilant (TRELEGY ELLIPTA) 100-62.5-25 MCG/ACT   Cholecalciferol (VITAMIN D) 50 MCG (2000 UT) CAPS  Have you contacted your pharmacy to request a refill? No   Which pharmacy  [facility] (Now [facility]) - [address] [place], [place] - [address] [place] [address] Phone: [phone] Fax: [phone] would you like this sent to?     Patient notified that their request is being sent to the clinical staff for review and that they should receive a response within 2 business days.   Please advise at Mobile [phone] (mobile)

## 2022-11-12 NOTE — Telephone Encounter (Signed)
[facility] pharmacy called requesting a refill on: tiZANidine (ZANAFLEX) 4 MG tablet   Please advise.

## 2022-11-12 NOTE — Telephone Encounter (Signed)
Refill Sent. 

## 2022-11-12 NOTE — Telephone Encounter (Signed)
Rx filled per a verbal from Dr. Smith.  

## 2022-11-15 ENCOUNTER — Other Ambulatory Visit: Payer: Self-pay

## 2022-11-15 DIAGNOSIS — E1165 Type 2 diabetes mellitus with hyperglycemia: Secondary | ICD-10-CM

## 2022-11-15 MED ORDER — INSULIN GLARGINE-YFGN 100 UNIT/ML ~~LOC~~ SOPN
30.0000 [IU] | PEN_INJECTOR | Freq: Every day | SUBCUTANEOUS | 3 refills | Status: DC
Start: 2022-11-15 — End: 2022-11-24

## 2022-11-15 MED ORDER — ACCU-CHEK GUIDE VI STRP
ORAL_STRIP | Freq: Two times a day (BID) | 3 refills | Status: AC
Start: 2022-11-15 — End: ?

## 2022-11-15 MED ORDER — OZEMPIC (0.25 OR 0.5 MG/DOSE) 2 MG/3ML ~~LOC~~ SOPN: 0.5000 mg | PEN_INJECTOR | SUBCUTANEOUS | 3 refills | Status: DC

## 2022-11-15 MED ORDER — ACCU-CHEK SOFTCLIX LANCETS MISC
3 refills | Status: DC
Start: 2022-11-15 — End: 2022-12-17

## 2022-11-19 ENCOUNTER — Ambulatory Visit
Admission: RE | Admit: 2022-11-19 | Discharge: 2022-11-19 | Disposition: A | Discharge status: Home / Self Care | Payer: Medicare HMO | Source: Ambulatory Visit | Attending: Internal Medicine | Admitting: Internal Medicine

## 2022-11-19 DIAGNOSIS — Z1231 Encounter for screening mammogram for malignant neoplasm of breast: Secondary | ICD-10-CM

## 2022-11-24 ENCOUNTER — Telehealth: Payer: Self-pay

## 2022-11-24 MED ORDER — TOUJEO SOLOSTAR 300 UNIT/ML ~~LOC~~ SOPN: 30.0000 [IU] | PEN_INJECTOR | Freq: Every day | SUBCUTANEOUS | 2 refills | Status: DC

## 2022-11-24 NOTE — Telephone Encounter (Signed)
Received a call from [employer]. The pharmacist called stating that Semglee is not on her formulary they are requesting Lantus Solostar or Toujeo u300. Please advise on which to change it to and the dosing please

## 2022-11-24 NOTE — Telephone Encounter (Signed)
Requested Prescriptions   Signed Prescriptions Disp Refills   insulin glargine, 1 Unit Dial, (TOUJEO SOLOSTAR) 300 UNIT/ML Solostar Pen 30 mL 2    Sig: Inject 30 Units into the skin daily.    Authorizing Provider: Reather Littler    Ordering User: Pollie Meyer

## 2022-11-25 ENCOUNTER — Ambulatory Visit: Payer: Medicare HMO | Attending: Internal Medicine

## 2022-11-25 DIAGNOSIS — R002 Palpitations: Secondary | ICD-10-CM

## 2022-12-01 ENCOUNTER — Ambulatory Visit: Payer: Medicare HMO | Attending: Internal Medicine | Admitting: Internal Medicine

## 2022-12-01 ENCOUNTER — Encounter: Payer: Self-pay | Admitting: Internal Medicine

## 2022-12-01 VITALS — BP 108/68 | HR 93 | Ht 60.0 in | Wt 143.0 lb

## 2022-12-01 DIAGNOSIS — R002 Palpitations: Secondary | ICD-10-CM

## 2022-12-01 DIAGNOSIS — G43709 Chronic migraine without aura, not intractable, without status migrainosus: Secondary | ICD-10-CM | POA: Diagnosis not present

## 2022-12-01 DIAGNOSIS — R053 Chronic cough: Secondary | ICD-10-CM

## 2022-12-01 DIAGNOSIS — G4733 Obstructive sleep apnea (adult) (pediatric): Secondary | ICD-10-CM

## 2022-12-01 NOTE — Patient Instructions (Signed)
Medication Instructions:  Your physician recommends that you continue on your current medications as directed. Please refer to the Current Medication list given to you today.  *If you need a refill on your cardiac medications before your next appointment, please call your pharmacy*   Testing/Procedures: Your physician has requested that you have an echocardiogram. Echocardiography is a painless test that uses sound waves to create images of your heart. It provides your doctor with information about the size and shape of your heart and how well your heart's chambers and valves are working. This procedure takes approximately one hour. There are no restrictions for this procedure. Please do NOT wear cologne, perfume, aftershave, or lotions (deodorant is allowed). Please arrive 15 minutes prior to your appointment time. This will take place at [address] [place]    Follow-Up: At [facility], you and your health needs are our priority.  As part of our continuing mission to provide you with exceptional heart care, we have created designated Provider Care Teams.  These Care Teams include your primary Cardiologist (physician) and Advanced Practice Providers (APPs -  Physician Assistants and Nurse Practitioners) who all work together to provide you with the care you need, when you need it.  We recommend signing up for the patient portal called "MyChart".  Sign up information is provided on this After Visit Summary.  MyChart is used to connect with patients for Virtual Visits (Telemedicine).  Patients are able to view lab/test results, encounter notes, upcoming appointments, etc.  Non-urgent messages can be sent to your provider as well.   To learn more about what you can do with MyChart, go to [email or web address].    Your next appointment:   4-6 week(s)  Provider:   Marjie Skiff, PA-C, Joni Reining, DNP, ANP, or Bernadene Person, NP

## 2022-12-01 NOTE — Progress Notes (Signed)
Cardiology Office Note:    Date:  12/01/2022   ID:  Rachel [name], DOB 29-Oct-1965, MRN [id number]  PCP:  Corwin Levins, MD  Cardiologist:  None  Electrophysiologist:  None   Referring MD: Corwin Levins, MD   Chief Complaint/Reason for Referral: palpitations  History of Present Illness:    Rachel [name] is a 57 y.o. female with a history of GERD, migraines, moderate OSA who presents for evaluation of palpitations.  Cardiac monitor reviewed with no worrisome findings.  No atrial fibrillation and no malignant arrhythmia.  There is a less than 1% burden of PACs and PVCs, however these are associated with symptoms.  Symptoms occur with sinus rhythm and ectopy as well as sinus tachycardia.  Patient does not describe any heart failure symptoms and notes that she does not have peripheral edema and her weights have been stable and she weighs daily.  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  She mostly notes her symptoms occur at night, she will have palpitations that then trigger coughing and the coughing spells well be persistent.  Coughing most frequently at night.  Palpitations last for seconds at a time approximately 2 times per week.  No associated syncope.  Recent diagnosis of obstructive sleep apnea that is moderate she tried the CPAP mask but was unable to tolerate this as she felt it provoked her migraines, she has been prescribed a different mask fitting but feels this does not stay on her face well.  We discussed inspire device and she may consider this with her pulmonologist.  BMI seems permissible.  I do query if her palpitations and maybe even her migraines are worsened by OSA.  Denies chest discomfort, exertional shortness of breath.  Denies syncope, endorses snoring.  No lower extremity edema.  Past Medical History:  Diagnosis Date   ALLERGIC RHINITIS 10/04/2007   Anemia 01/21/2011   Anxiety 11/15/2018   ASTHMA 08/02/2007   INHALERS ONLY IN [place]    Asthma 08/02/2007   Qualifier: Diagnosis of  By: Nena Jordan    Blood transfusion without reported diagnosis    with first child    Cervical radiculopathy 01/21/2011   Cervicogenic headache 04/27/2017   COMMON MIGRAINE 05/15/2009   Depression 01/09/2010   Qualifier: Diagnosis of  By: Jonny Ruiz MD, Len Blalock    DIABETES MELLITUS, TYPE II 08/02/2007   Dysphagia, pharyngoesophageal phase 06/12/2014   GERD 08/02/2007   HYPERLIPIDEMIA 08/02/2007   Hyperlipidemia 08/02/2007   Qualifier: Diagnosis of  By: Nena Jordan    INSOMNIA-SLEEP DISORDER-UNSPEC 01/04/2008   INTERMITTENT VERTIGO 05/15/2009   LIBIDO, DECREASED 01/09/2010   Migraine without aura 05/15/2009   Qualifier: Diagnosis of  By: Jonny Ruiz MD, Len Blalock    Nonallopathic lesion of rib cage 12/21/2017   Nonallopathic lesion of sacral region 07/29/2015   Nonallopathic lesion of thoracic region 07/29/2015   Osteoporosis 04/25/2019   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   Rheumatoid factor positive 02/10/2016   Right knee pain 01/31/2019   Injected January 31, 2019   Type 2 diabetes mellitus with hyperglycemia, with long-term current use of insulin (HCC) 08/02/2007   Qualifier: Diagnosis of  By: Nena Jordan     Past Surgical History:  Procedure Laterality Date   CESAREAN SECTION     x 3   CYST EXCISION     left knee   PAROTIDECTOMY  10/21/2011   Procedure: PAROTIDECTOMY;  Surgeon: Christia Reading,  MD;  Location: [facility] OR;  Service: ENT;  Laterality: Left;    Current Medications: Current Meds  Medication Sig   Accu-Chek Softclix Lancets lancets Use as instructed to check blood sugar 2 times daily   aspirin 81 MG EC tablet Take 81 mg by mouth daily.   Blood Glucose Monitoring Suppl (ACCU-CHEK GUIDE) w/Device KIT Use as instructed to check blood sugar 2 times daily   botulinum toxin Type A (BOTOX) 200 units injection Inject 155 units IM Into multiple sites in the face,neck,and head every 90 days   butalbital-acetaminophen-caffeine  (FIORICET) 50-325-40 MG tablet Take 1 tablet by mouth every 6 (six) hours as needed for headache. Max 2 tablets per day   Cholecalciferol (VITAMIN D) 50 MCG (2000 UT) CAPS Take 2 capsules (4,000 Units total) by mouth daily.   Continuous Glucose Sensor (DEXCOM G7 SENSOR) MISC Apply 1 sensor every 10 days   Diclofenac Sodium (PENNSAID) 2 % SOLN Place 2 g onto the skin 2 (two) times daily.   DULoxetine (CYMBALTA) 60 MG capsule Take 1 capsule (60 mg total) by mouth daily.   esomeprazole (NEXIUM) 40 MG capsule Take 1 capsule (40 mg total) by mouth 2 (two) times daily before a meal.   Fluticasone-Umeclidin-Vilant (TRELEGY ELLIPTA) 100-62.5-25 MCG/ACT AEPB Inhale 1 puff into the lungs daily.   gabapentin (NEURONTIN) 300 MG capsule Take 2 capsules (600 mg total) by mouth 2 (two) times daily.   glucose blood (ACCU-CHEK GUIDE) test strip Use to check blood sugar 2 (two) times daily as directed   insulin glargine, 1 Unit Dial, (TOUJEO SOLOSTAR) 300 UNIT/ML Solostar Pen Inject 30 Units into the skin daily.   linaclotide (LINZESS) 145 MCG CAPS capsule Take 1 capsule (145 mcg total) by mouth daily before breakfast   metoCLOPramide (REGLAN) 5 MG tablet Take 1 tablet (5 mg total) by mouth 3 (three) times daily 30 minutes before meals   Multiple Vitamin (MULTIVITAMIN WITH MINERALS) TABS tablet Take 1 tablet by mouth daily.   neomycin-polymyxin-hydrocortisone (CORTISPORIN) OTIC solution Place 4 drops into the left ear 4 (four) times daily.   ondansetron (ZOFRAN ODT) 8 MG disintegrating tablet Take 1 tablet (8 mg total) by mouth every 8 (eight) hours as needed for nausea or vomiting.   ondansetron (ZOFRAN) 4 MG tablet Take 1 tablet (4 mg total) by mouth every 8 (eight) hours as needed for nausea & vomiting   predniSONE (DELTASONE) 10 MG tablet take 3 tabs by mouth a day for 3 days, then 2 tabs a day for 3 days, then 1 tab per day for 3 days   propranolol (INDERAL) 20 MG tablet Take 1.5 tablets (30 mg total) by mouth  at bedtime.   rosuvastatin (CRESTOR) 20 MG tablet Take 1 tablet (20 mg total) by mouth daily.   Semaglutide,0.25 or 0.5MG /DOS, (OZEMPIC, 0.25 OR 0.5 MG/DOSE,) 2 MG/3ML SOPN Inject 0.5 mg into the skin once a week.   tiZANidine (ZANAFLEX) 4 MG tablet Take 1 tablet (4 mg total) by mouth every 8 (eight) hours as needed for muscle spasms.   tiZANidine (ZANAFLEX) 4 MG tablet Take 1 tablet (4 mg total) by mouth at bedtime.   triamcinolone (NASACORT) 55 MCG/ACT AERO nasal inhaler Place 2 sprays into the nose daily.   Ubrogepant (UBRELVY) 100 MG TABS Take 1 tablet (100 mg total) by mouth as needed. May repeat after 2 hours.  Maximum 2 tablets in 24 hours.     Allergies:   Lipitor [atorvastatin] and Nitroglycerin   Social History  Tobacco Use   Smoking status: Never   Smokeless tobacco: Never  Vaping Use   Vaping Use: Never used  Substance Use Topics   Alcohol use: No    Alcohol/week: 0.0 standard drinks of alcohol   Drug use: No     Family History: The patient's family history includes Asthma in her father; Cervical cancer in her maternal aunt; Diabetes in her father; Heart disease in her maternal aunt; Hypertension in her father. There is no history of Anesthesia problems, Colon cancer, Breast cancer, Esophageal cancer, or Stomach cancer.  ROS:   Please see the history of present illness.    All other systems reviewed and are negative.  EKGs/Labs/Other Studies Reviewed:    The following studies were reviewed today:  EKG:  EKG Interpretation  Date/Time:  Wednesday December 01 2022 16:09:14 EDT Ventricular Rate:  93 PR Interval:  134 QRS Duration: 76 QT Interval:  346 QTC Calculation: 430 R Axis:   -30 Text Interpretation: Normal sinus rhythm Left axis deviation Nonspecific T wave abnormality When compared with ECG of 21-Jan-2020 09:52, QRS axis Shifted left Nonspecific T wave abnormality now evident in Anterior leads Confirmed by Weston Brass ([id number]) on 12/01/2022 4:36:37 PM  EKG  Interpretation  Date/Time:  Wednesday December 01 2022 16:09:14 EDT Ventricular Rate:  93 PR Interval:  134 QRS Duration: 76 QT Interval:  346 QTC Calculation: 430 R Axis:   -30 Text Interpretation: Normal sinus rhythm Left axis deviation Nonspecific T wave abnormality When compared with ECG of 21-Jan-2020 09:52, QRS axis Shifted left Nonspecific T wave abnormality now evident in Anterior leads Confirmed by Weston Brass ([id number]) on 12/01/2022 4:36:37 PM   Imaging studies that I have independently reviewed today: N/A  Recent Labs: 10/18/2022: ALT 12; BUN 10; Creatinine, Ser 0.73; Hemoglobin 12.0; Platelets 368.0; Potassium 3.8; Sodium 138; TSH 0.70  Recent Lipid Panel    Component Value Date/Time   CHOL 147 10/18/2022 1514   TRIG 164.0 (H) 10/18/2022 1514   HDL 56.80 10/18/2022 1514   CHOLHDL 3 10/18/2022 1514   VLDL 32.8 10/18/2022 1514   LDLCALC 57 10/18/2022 1514   LDLDIRECT 170.0 04/07/2022 1307    Physical Exam:    VS:  BP 108/68 (BP Location: Right Arm, Patient Position: Sitting, Cuff Size: Normal)   Pulse 93   Ht 5' (1.524 m)   Wt 143 lb (64.9 kg)   SpO2 99%   BMI 27.93 kg/m     Wt Readings from Last 5 Encounters:  12/01/22 143 lb (64.9 kg)  10/20/22 141 lb (64 kg)  10/18/22 144 lb (65.3 kg)  09/20/22 143 lb (64.9 kg)  08/13/22 143 lb 12.8 oz (65.2 kg)    Constitutional: No acute distress Eyes: sclera non-icteric, normal conjunctiva and lids ENMT: normal dentition, moist mucous membranes Cardiovascular: regular rhythm, normal rate, no murmur. S1 and S2 normal. No jugular venous distention.  Respiratory: clear to auscultation bilaterally GI : normal bowel sounds, soft and nontender. No distention.   MSK: extremities warm, well perfused. No edema.  NEURO: grossly nonfocal exam, moves all extremities. PSYCH: alert and oriented x 3, normal mood and affect.   ASSESSMENT:    1. Palpitations   2. OSA (obstructive sleep apnea)   3. Chronic migraine without aura  without status migrainosus, not intractable   4. Moderate obstructive sleep apnea   5. Chronic cough    PLAN:    Palpitations - Plan: EKG 12-Lead, ECHOCARDIOGRAM COMPLETE  OSA (obstructive sleep apnea) - Plan: ECHOCARDIOGRAM COMPLETE  Chronic migraine without aura without status migrainosus, not intractable  Moderate obstructive sleep apnea  Chronic cough  Patient's primary concern is a nocturnal cough which she does carry diagnosis of GERD, I wonder the contribution of GERD to her supine coughing symptoms.  Palpitations seem overall benign with a cardiac monitor suggesting sinus rhythm, sinus tachycardia, and infrequent ventricular and atrial ectopy.  We discussed medication therapy.  She does have as needed propranolol for migraines, this could be used as needed for palpitations.  She does not feel symptom management is required at this time.  Consider further management of OSA as this likely contributes to palpitations.  She consumes 1 cup of coffee daily and otherwise maintains her hydration and consumes no alcohol.  Seems like the most likely trigger of palpitations is OSA.  Will obtain an echocardiogram to evaluate pulmonary pressures and structural heart disease, evaluate atrial size.   Weston Brass, MD, [facility]  [facility]   Shared Decision Making/Informed Consent:       Medication Adjustments/Labs and Tests Ordered: Current medicines are reviewed at length with the patient today.  Concerns regarding medicines are outlined above.   Orders Placed This Encounter  Procedures   EKG 12-Lead   ECHOCARDIOGRAM COMPLETE    No orders of the defined types were placed in this encounter.   Patient Instructions  Medication Instructions:  Your physician recommends that you continue on your current medications as directed. Please refer to the Current Medication list given to you today.  *If you need a refill on your cardiac medications before your next appointment,  please call your pharmacy*   Testing/Procedures: Your physician has requested that you have an echocardiogram. Echocardiography is a painless test that uses sound waves to create images of your heart. It provides your doctor with information about the size and shape of your heart and how well your heart's chambers and valves are working. This procedure takes approximately one hour. There are no restrictions for this procedure. Please do NOT wear cologne, perfume, aftershave, or lotions (deodorant is allowed). Please arrive 15 minutes prior to your appointment time. This will take place at [address] [place]    Follow-Up: At [facility], you and your health needs are our priority.  As part of our continuing mission to provide you with exceptional heart care, we have created designated Provider Care Teams.  These Care Teams include your primary Cardiologist (physician) and Advanced Practice Providers (APPs -  Physician Assistants and Nurse Practitioners) who all work together to provide you with the care you need, when you need it.  We recommend signing up for the patient portal called "MyChart".  Sign up information is provided on this After Visit Summary.  MyChart is used to connect with patients for Virtual Visits (Telemedicine).  Patients are able to view lab/test results, encounter notes, upcoming appointments, etc.  Non-urgent messages can be sent to your provider as well.   To learn more about what you can do with MyChart, go to [email or web address].    Your next appointment:   4-6 week(s)  Provider:   Marjie Skiff, PA-C, Joni Reining, DNP, ANP, or Bernadene Person, NP

## 2022-12-07 NOTE — Progress Notes (Unsigned)
[name] [address] [place] [address] Phone: [phone] Subjective:   Rachel [name], am serving as a scribe for Dr. Antoine Primas.  I'm seeing this patient by the request  of:  Corwin Levins, MD  CC: Back and neck pain follow-up  IDP:[id number]  Rachel [name] is a 57 y.o. female coming in with complaint of back and neck pain. OMT 10/20/2022. Injected knees last visit. Patient states here for manipulation. Knee's are doing well.  Medications patient has been prescribed: Zanaflex  Taking:         Reviewed prior external information including notes and imaging from previsou exam, outside providers and external EMR if available.   As well as notes that were available from care everywhere and other healthcare systems.  Past medical history, social, surgical and family history all reviewed in electronic medical record.  No pertanent information unless stated regarding to the chief complaint.   Past Medical History:  Diagnosis Date   ALLERGIC RHINITIS 10/04/2007   Anemia 01/21/2011   Anxiety 11/15/2018   ASTHMA 08/02/2007   INHALERS ONLY IN [place]   Asthma 08/02/2007   Qualifier: Diagnosis of  By: Nena Jordan    Blood transfusion without reported diagnosis    with first child    Cervical radiculopathy 01/21/2011   Cervicogenic headache 04/27/2017   COMMON MIGRAINE 05/15/2009   Depression 01/09/2010   Qualifier: Diagnosis of  By: Jonny Ruiz MD, Len Blalock    DIABETES MELLITUS, TYPE II 08/02/2007   Dysphagia, pharyngoesophageal phase 06/12/2014   GERD 08/02/2007   HYPERLIPIDEMIA 08/02/2007   Hyperlipidemia 08/02/2007   Qualifier: Diagnosis of  By: Nena Jordan    INSOMNIA-SLEEP DISORDER-UNSPEC 01/04/2008   INTERMITTENT VERTIGO 05/15/2009   LIBIDO, DECREASED 01/09/2010   Migraine without aura 05/15/2009   Qualifier: Diagnosis of  By: Jonny Ruiz MD, Len Blalock    Nonallopathic lesion of rib cage 12/21/2017   Nonallopathic lesion of sacral region  07/29/2015   Nonallopathic lesion of thoracic region 07/29/2015   Osteoporosis 04/25/2019   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   Rheumatoid factor positive 02/10/2016   Right knee pain 01/31/2019   Injected January 31, 2019   Type 2 diabetes mellitus with hyperglycemia, with long-term current use of insulin (HCC) 08/02/2007   Qualifier: Diagnosis of  By: Nena Jordan     Allergies  Allergen Reactions   Lipitor [Atorvastatin]     REACTION: myalgias   Nitroglycerin      Review of Systems:  No  visual changes, nausea, vomiting, diarrhea, constipation, dizziness, abdominal pain, skin rash, fevers, chills, night sweats, weight loss, swollen lymph nodes, body aches, joint swelling, chest pain, shortness of breath, mood changes. POSITIVE muscle aches, headaches  Objective  Blood pressure 110/72, height 5' (1.524 m), weight 145 lb (65.8 kg), SpO2 98 %.   General: No apparent distress alert and oriented x3 mood and affect normal, dressed appropriately.  HEENT: Pupils equal, extraocular movements intact  Respiratory: Patient's speak in full sentences and does not appear short of breath  Cardiovascular: No lower extremity edema, non tender, no erythema  Back does have some loss lordosis noted.  Some tightness noted with the paraspinal musculature.  Patient does have tightness with FABER test.  Neurovascular intact distally.  Patient's pain is out of proportion to the amount of palpation.  Knee exam does show the crepitus noted and still tender but improvement noted from previous exam  Osteopathic findings  C2 flexed rotated and side bent left C3 flexed rotated and side bent left T4 extended rotated and side bent left inhaled rib T5 extended rotated and side bent left inhaled rib L2 flexed rotated and side bent right Sacrum right on right       Assessment and Plan:  No problem-specific Assessment & Plan notes found for this encounter.    Nonallopathic  problems  Decision today to treat with OMT was based on Physical Exam  After verbal consent patient was treated with HVLA, ME, FPR techniques in cervical, rib, thoracic, lumbar, and sacral  areas  Patient tolerated the procedure well with improvement in symptoms  Patient given exercises, stretches and lifestyle modifications  See medications in patient instructions if given  Patient will follow up in 4-8 weeks     The above documentation has been reviewed and is accurate and complete Judi Saa, DO         Note: This dictation was prepared with Dragon dictation along with smaller phrase technology. Any transcriptional errors that result from this process are unintentional.

## 2022-12-08 ENCOUNTER — Encounter: Payer: Self-pay | Admitting: Family Medicine

## 2022-12-08 ENCOUNTER — Ambulatory Visit (INDEPENDENT_AMBULATORY_CARE_PROVIDER_SITE_OTHER): Payer: Medicare HMO | Admitting: Family Medicine

## 2022-12-08 VITALS — BP 110/72 | Ht 60.0 in | Wt 145.0 lb

## 2022-12-08 DIAGNOSIS — M9901 Segmental and somatic dysfunction of cervical region: Secondary | ICD-10-CM

## 2022-12-08 DIAGNOSIS — M9904 Segmental and somatic dysfunction of sacral region: Secondary | ICD-10-CM | POA: Diagnosis not present

## 2022-12-08 DIAGNOSIS — M9908 Segmental and somatic dysfunction of rib cage: Secondary | ICD-10-CM | POA: Diagnosis not present

## 2022-12-08 DIAGNOSIS — M9903 Segmental and somatic dysfunction of lumbar region: Secondary | ICD-10-CM

## 2022-12-08 DIAGNOSIS — M9902 Segmental and somatic dysfunction of thoracic region: Secondary | ICD-10-CM

## 2022-12-08 DIAGNOSIS — G4486 Cervicogenic headache: Secondary | ICD-10-CM

## 2022-12-08 MED ORDER — METHYLPREDNISOLONE ACETATE 40 MG/ML IJ SUSP
40.0000 mg | Freq: Once | INTRAMUSCULAR | Status: AC
  Administered 2022-12-08: 40 mg via INTRAMUSCULAR

## 2022-12-08 MED ORDER — KETOROLAC TROMETHAMINE 30 MG/ML IJ SOLN
30.0000 mg | Freq: Once | INTRAMUSCULAR | Status: AC
  Administered 2022-12-08: 30 mg via INTRAMUSCULAR

## 2022-12-08 NOTE — Assessment & Plan Note (Signed)
Significant headaches noted bilaterally.  Discussed icing regimen and home exercises, core strengthening.  Discussed stability.  Follow-up again in 6 to 8 weeks due to worsening pain Toradol and Depo-Medrol given again today.  Discussed medication such as the Cymbalta.

## 2022-12-08 NOTE — Patient Instructions (Signed)
Cocktail injection See you again in 7-8 weeks

## 2022-12-15 ENCOUNTER — Ambulatory Visit
Admission: RE | Admit: 2022-12-15 | Discharge: 2022-12-15 | Disposition: A | Discharge status: Home / Self Care | Payer: Medicare HMO | Source: Ambulatory Visit | Attending: Nurse Practitioner | Admitting: Nurse Practitioner

## 2022-12-15 DIAGNOSIS — N958 Other specified menopausal and perimenopausal disorders: Secondary | ICD-10-CM | POA: Diagnosis not present

## 2022-12-15 DIAGNOSIS — E349 Endocrine disorder, unspecified: Secondary | ICD-10-CM | POA: Diagnosis not present

## 2022-12-15 DIAGNOSIS — M810 Age-related osteoporosis without current pathological fracture: Secondary | ICD-10-CM | POA: Diagnosis not present

## 2022-12-15 DIAGNOSIS — M85852 Other specified disorders of bone density and structure, left thigh: Secondary | ICD-10-CM

## 2022-12-17 ENCOUNTER — Encounter: Payer: Self-pay | Admitting: Internal Medicine

## 2022-12-17 ENCOUNTER — Ambulatory Visit (INDEPENDENT_AMBULATORY_CARE_PROVIDER_SITE_OTHER): Payer: Medicare HMO | Admitting: Internal Medicine

## 2022-12-17 ENCOUNTER — Other Ambulatory Visit: Payer: Self-pay

## 2022-12-17 VITALS — BP 124/80 | HR 86 | Ht 60.0 in | Wt 146.0 lb

## 2022-12-17 DIAGNOSIS — E663 Overweight: Secondary | ICD-10-CM | POA: Diagnosis not present

## 2022-12-17 DIAGNOSIS — E782 Mixed hyperlipidemia: Secondary | ICD-10-CM | POA: Diagnosis not present

## 2022-12-17 DIAGNOSIS — G63 Polyneuropathy in diseases classified elsewhere: Secondary | ICD-10-CM

## 2022-12-17 DIAGNOSIS — E1165 Type 2 diabetes mellitus with hyperglycemia: Secondary | ICD-10-CM

## 2022-12-17 DIAGNOSIS — Z794 Long term (current) use of insulin: Secondary | ICD-10-CM | POA: Diagnosis not present

## 2022-12-17 DIAGNOSIS — Z7985 Long-term (current) use of injectable non-insulin antidiabetic drugs: Secondary | ICD-10-CM

## 2022-12-17 DIAGNOSIS — E119 Type 2 diabetes mellitus without complications: Secondary | ICD-10-CM

## 2022-12-17 LAB — HEMOGLOBIN A1C: Hemoglobin A1C: 8.1

## 2022-12-17 MED ORDER — ACCU-CHEK FASTCLIX LANCETS MISC: 3 refills | Status: AC

## 2022-12-17 MED ORDER — ACCU-CHEK FASTCLIX LANCETS MISC: 3 refills | Status: DC

## 2022-12-17 NOTE — Progress Notes (Signed)
Subjective:     Patient ID: Rachel [name], female   DOB: September 21, 1965, 57 y.o.   MRN: [id number]  HPI Rachel [name] is a  57 y.o. woman, returning for f/u for DM2, dx 1987, uncontrolled, insulin-dependent, with probable complications (? peripheral neuropathy, ?  Gastroparesis).  Last visit 4 months ago.  Interim history: No increased urination, blurry vision, chest pain.   Reviewed HbA1c levels: Lab Results  Component Value Date   HGBA1C 8.3 (H) 10/18/2022   HGBA1C 8.4 (A) 08/13/2022   HGBA1C 7.7 (H) 04/07/2022   HGBA1C 7.2 (A) 03/09/2022   HGBA1C 7.2 (A) 11/06/2021   HGBA1C 6.8 (A) 08/04/2021   HGBA1C 6.7 (A) 03/30/2021   HGBA1C 8.3 (H) 12/30/2020   HGBA1C 6.6 (A) 09/16/2020   HGBA1C 7.8 (A) 06/17/2020   HGBA1C 10.0 (H) 08/29/2019   HGBA1C 11.1 (A) 07/02/2019   HGBA1C 10.2 (H) 11/14/2018   HGBA1C 11.2 (H) 08/23/2018   HGBA1C 8.6 (A) 04/26/2018   HGBA1C 0 04/26/2018   HGBA1C 0 (A) 04/26/2018   HGBA1C 0.0 04/26/2018   HGBA1C 12.7 (A) 11/23/2017   HGBA1C 9.2 02/17/2017   HGBA1C 9.2 11/11/2016   HGBA1C 11.5 (H) 06/25/2016   HGBA1C 10.9 04/29/2015   HGBA1C 10.8 (H) 01/09/2015   HGBA1C 12.7 (H) 06/24/2014   HGBA1C 15.1 (H) 10/04/2013   HGBA1C 8.4 (H) 01/11/2013   HGBA1C 8.6 (H) 10/02/2012   HGBA1C 10.1 (H) 06/13/2012   HGBA1C 9.9 (H) 01/25/2012  02/17/2017: HbA1c calculated from fructosamine is better, at 8.34%, but still high. Prev. 10.1%.  Reviewed history: She came off all DM medicines for 6 mo before the HbA1c in 09/2013. She again ran out of all meds in 04/2016.  She is  on: - Lantus 40 >> ... Semglee 24 >> 30 >> 24-28 >> Toujeo 24-28 units in a.m. - Ozempic 0.5 mg weekly - added 06/2019 >> 0.25 >> 0.5 mg weekly Stopped Januvia 100 mg in am >> then Ozempic 0.5 mg weekly She is off Metformin XR 1000 mg 2x a day with b'fast and dinner >> upset stomach >> stopped 04/2019 We triedTrulicity 1.5 mg weekly >> worked great but had to stop b/c GERD >> stopped. We tried  Jardiance 25 mg daily >> started 08/2016 >> but stopped recently 2/2 recurrent UTIs She was on Glipizide 5 mg bid - added 04/2015 >> was not taking it b/c low CBGs. We stopped Glipizide XL 5 mg in 07/2014. She had episodes of hypoglycemia with Amaryl in the past. She was on Bydureon 2 mg weekly (had nausea). Previously on Humalog, then NovoLog -started 12/2020  She checks her sugars 2 times a day: - am: 160 w/o insulin; 100-130 w/ insulin >> on Ozempic: 80, 124-138 >> 87-140, 205 - after b'fast: 158-251 >> n/c  - before lunch: 75 (fasting, w/o insulin) >> 99 >> on Ozempic: n/c  - after lunch: up to 200s >> n/c - before dinner:  180-200 >> 137, 150 >> 140-150  >> n/c - after dinner: 166-245 >> n/c - bedtime: 100-125 >> n/c >> 130s >> 145-150s >> n/c >> 200 >> 103-125, 140 She has hypoglycemia awareness in the 90s. Lowest: 38 (while on Trulicity) >> ...  82 >> 87. Highest 600 >> ...  200 (candy) >> 205   She saw nutrition in the past.  -+ HL. Last lipids: Lab Results  Component Value Date   CHOL 147 10/18/2022   HDL 56.80 10/18/2022   LDLCALC 57 10/18/2022   LDLDIRECT 170.0 04/07/2022  TRIG 164.0 (H) 10/18/2022   CHOLHDL 3 10/18/2022  On Crestor 40 - restarted after  the above labs returned.  -No CKD: Lab Results  Component Value Date   BUN 10 10/18/2022   Lab Results  Component Value Date   CREATININE 0.73 10/18/2022   -+ Numbness and tingling in the right leg.  Last foot exam 11/06/2021.  - Latest eye appointment was in 06/2022: No DR reportedly.  She had a gastric emptying study (02/03/2021) showed gastroparesis (but checked on Ozempic!). Started on Reglan, but not using it consistently. She has constipation - on Linzess. 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 She was dx'ed with OSA >> on CPAP.  She is on disability.  Review of Systems + see HPI  I reviewed pt's medications, allergies, PMH, social hx, family hx, and changes were documented in the history of  present illness. Otherwise, unchanged from my initial visit note.  Past Medical History:  Diagnosis Date   ALLERGIC RHINITIS 10/04/2007   Anemia 01/21/2011   Anxiety 11/15/2018   ASTHMA 08/02/2007   INHALERS ONLY IN [place]   Asthma 08/02/2007   Qualifier: Diagnosis of  By: Nena Jordan    Blood transfusion without reported diagnosis    with first child    Cervical radiculopathy 01/21/2011   Cervicogenic headache 04/27/2017   COMMON MIGRAINE 05/15/2009   Depression 01/09/2010   Qualifier: Diagnosis of  By: Jonny Ruiz MD, Len Blalock    DIABETES MELLITUS, TYPE II 08/02/2007   Dysphagia, pharyngoesophageal phase 06/12/2014   GERD 08/02/2007   HYPERLIPIDEMIA 08/02/2007   Hyperlipidemia 08/02/2007   Qualifier: Diagnosis of  By: Nena Jordan    INSOMNIA-SLEEP DISORDER-UNSPEC 01/04/2008   INTERMITTENT VERTIGO 05/15/2009   LIBIDO, DECREASED 01/09/2010   Migraine without aura 05/15/2009   Qualifier: Diagnosis of  By: Jonny Ruiz MD, Len Blalock    Nonallopathic lesion of rib cage 12/21/2017   Nonallopathic lesion of sacral region 07/29/2015   Nonallopathic lesion of thoracic region 07/29/2015   Osteoporosis 04/25/2019   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   Rheumatoid factor positive 02/10/2016   Right knee pain 01/31/2019   Injected January 31, 2019   Type 2 diabetes mellitus with hyperglycemia, with long-term current use of insulin (HCC) 08/02/2007   Qualifier: Diagnosis of  By: Nena Jordan    Past Surgical History:  Procedure Laterality Date   CESAREAN SECTION     x 3   CYST EXCISION     left knee   PAROTIDECTOMY  10/21/2011   Procedure: PAROTIDECTOMY;  Surgeon: Christia Reading, MD;  Location: [facility] OR;  Service: ENT;  Laterality: Left;   Social History   Socioeconomic History   Marital status: Married    Spouse name: Lars Mage   Number of children: 3   Years of education: Degree   Highest education level: Associate degree: occupational, Scientist, product/process development, or vocational program  Occupational History    Occupation: Psychologist, sport and exercise    Employer: [employer]   Occupation: Psychologist, sport and exercise    Employer: [employer]  Tobacco Use   Smoking status: Never   Smokeless tobacco: Never  Vaping Use   Vaping Use: Never used  Substance and Sexual Activity   Alcohol use: No    Alcohol/week: 0.0 standard drinks of alcohol   Drug use: No   Sexual activity: Not on file  Other Topics Concern   Not on file  Social History Narrative   She has 3 daughters.  Patient has a 4 year degree.    Patient working at [employer].    Patient is married to Neche.   Social Determinants of Health   Financial Resource Strain: High Risk (10/18/2022)   Overall Financial Resource Strain (CARDIA)    Difficulty of Paying Living Expenses: Very hard  Food Insecurity: Patient Declined (10/18/2022)   Hunger Vital Sign    Worried About Running Out of Food in the Last Year: Patient declined    Ran Out of Food in the Last Year: Patient declined  Transportation Needs: Unmet Transportation Needs (10/18/2022)   PRAPARE - Administrator, Civil Service (Medical): Yes    Lack of Transportation (Non-Medical): No  Physical Activity: Unknown (10/18/2022)   Exercise Vital Sign    Days of Exercise per Week: 0 days    Minutes of Exercise per Session: Not on file  Stress: Stress Concern Present (10/18/2022)   [employer] of Occupational Health - Occupational Stress Questionnaire    Feeling of Stress : Very much  Social Connections: Unknown (10/18/2022)   Social Connection and Isolation Panel [NHANES]    Frequency of Communication with Friends and Family: More than three times a week    Frequency of Social Gatherings with Friends and Family: Never    Attends Religious Services: Patient declined    Database administrator or Organizations: Yes    Attends Banker Meetings: Patient declined    Marital Status: Married  Catering manager Violence: Not on file   Current Outpatient Medications on File Prior to Visit   Medication Sig Dispense Refill   Accu-Chek Softclix Lancets lancets Use as instructed to check blood sugar 2 times daily 100 each 3   aspirin 81 MG EC tablet Take 81 mg by mouth daily.     Blood Glucose Monitoring Suppl (ACCU-CHEK GUIDE) w/Device KIT Use as instructed to check blood sugar 2 times daily 1 kit 0   botulinum toxin Type A (BOTOX) 200 units injection Inject 155 units IM Into multiple sites in the face,neck,and head every 90 days 1 each 4   butalbital-acetaminophen-caffeine (FIORICET) 50-325-40 MG tablet Take 1 tablet by mouth every 6 (six) hours as needed for headache. Max 2 tablets per day 30 tablet 3   Cholecalciferol (VITAMIN D) 50 MCG (2000 UT) CAPS Take 2 capsules (4,000 Units total) by mouth daily. 90 capsule 3   Continuous Glucose Sensor (DEXCOM G7 SENSOR) MISC Apply 1 sensor every 10 days 9 each 4   Diclofenac Sodium (PENNSAID) 2 % SOLN Place 2 g onto the skin 2 (two) times daily. 112 g 3   DULoxetine (CYMBALTA) 60 MG capsule Take 1 capsule (60 mg total) by mouth daily. 90 capsule 3   esomeprazole (NEXIUM) 40 MG capsule Take 1 capsule (40 mg total) by mouth 2 (two) times daily before a meal. 180 capsule 3   Fluticasone-Umeclidin-Vilant (TRELEGY ELLIPTA) 100-62.5-25 MCG/ACT AEPB Inhale 1 puff into the lungs daily. 60 each 11   gabapentin (NEURONTIN) 300 MG capsule Take 2 capsules (600 mg total) by mouth 2 (two) times daily. 120 capsule 3   glucose blood (ACCU-CHEK GUIDE) test strip Use to check blood sugar 2 (two) times daily as directed 200 each 3   insulin glargine, 1 Unit Dial, (TOUJEO SOLOSTAR) 300 UNIT/ML Solostar Pen Inject 30 Units into the skin daily. 30 mL 2   linaclotide (LINZESS) 145 MCG CAPS capsule Take 1 capsule (145 mcg total) by mouth daily before breakfast 30 capsule 2  metoCLOPramide (REGLAN) 5 MG tablet Take 1 tablet (5 mg total) by mouth 3 (three) times daily 30 minutes before meals 90 tablet 1   Multiple Vitamin (MULTIVITAMIN WITH MINERALS) TABS tablet Take  1 tablet by mouth daily.     neomycin-polymyxin-hydrocortisone (CORTISPORIN) OTIC solution Place 4 drops into the left ear 4 (four) times daily. 10 mL 0   ondansetron (ZOFRAN ODT) 8 MG disintegrating tablet Take 1 tablet (8 mg total) by mouth every 8 (eight) hours as needed for nausea or vomiting. 30 tablet 0   ondansetron (ZOFRAN) 4 MG tablet Take 1 tablet (4 mg total) by mouth every 8 (eight) hours as needed for nausea & vomiting 40 tablet 1   predniSONE (DELTASONE) 10 MG tablet take 3 tabs by mouth a day for 3 days, then 2 tabs a day for 3 days, then 1 tab per day for 3 days 18 tablet 0   propranolol (INDERAL) 20 MG tablet Take 1.5 tablets (30 mg total) by mouth at bedtime. 90 tablet 1   rosuvastatin (CRESTOR) 20 MG tablet Take 1 tablet (20 mg total) by mouth daily. 90 tablet 3   Semaglutide,0.25 or 0.5MG /DOS, (OZEMPIC, 0.25 OR 0.5 MG/DOSE,) 2 MG/3ML SOPN Inject 0.5 mg into the skin once a week. 9 mL 3   tiZANidine (ZANAFLEX) 4 MG tablet Take 1 tablet (4 mg total) by mouth every 8 (eight) hours as needed for muscle spasms. 90 tablet 5   tiZANidine (ZANAFLEX) 4 MG tablet Take 1 tablet (4 mg total) by mouth at bedtime. 90 tablet 0   triamcinolone (NASACORT) 55 MCG/ACT AERO nasal inhaler Place 2 sprays into the nose daily. 1 Inhaler 12   Ubrogepant (UBRELVY) 100 MG TABS Take 1 tablet (100 mg total) by mouth as needed. May repeat after 2 hours.  Maximum 2 tablets in 24 hours. 10 tablet 11   No current facility-administered medications on file prior to visit.   Allergies  Allergen Reactions   Lipitor [Atorvastatin]     REACTION: myalgias   Nitroglycerin    Family History  Problem Relation Age of Onset   Hypertension Father    Diabetes Father    Asthma Father    Cervical cancer Maternal Aunt    Heart disease Maternal Aunt    Anesthesia problems Neg Hx    Colon cancer Neg Hx    Breast cancer Neg Hx    Esophageal cancer Neg Hx    Stomach cancer Neg Hx     Objective:   Physical Exam BP  124/80   Pulse 86   Ht 5' (1.524 m)   Wt 146 lb (66.2 kg)   SpO2 99%   BMI 28.51 kg/m   Wt Readings from Last 3 Encounters:  12/17/22 146 lb (66.2 kg)  12/08/22 145 lb (65.8 kg)  12/01/22 143 lb (64.9 kg)   Constitutional: normal weight, in NAD Eyes:  EOMI, no exophthalmos ENT: no neck masses, no cervical lymphadenopathy Cardiovascular: RRR, No MRG Respiratory: CTA B Musculoskeletal: no deformities Skin:no rashes Neurological: no tremor with outstretched hands  Assessment:     1. DM2, uncontrolled, insulin-dependent, with probable complications - ? peripheral neuropathy  2. HL  3.  Overweight  4. PN  Plan:     1. Patient with history of uncontrolled type 2 diabetes, on basal insulin and weekly GLP-1 receptor agonist with previously improved control to an HbA1c of 7.2% of worsening afterwards after coming off Ozempic.  At last visit, she was finally able to restart it  1 week prior to the appointment and sugars improved.  HbA1c, however, was 8.4%.  She had another HbA1c afterwards, 2 months ago, which was still high, at 8.3%. -Has a history of nausea, vomiting, and bloating initially on Ozempic but these resolved.  She had a gastric emptying study that showed delay gastric emptying, but this was checked while she was on Ozempic.  She does have constipation, for which she takes Linzess.  This is improved. -At today's visit, sugars are at or slightly above goal in the morning and they are at goal at bedtime.  She is not usually checking sugars in between.  Based on these, the HbA1c should be lower than the one obtained today, probably around 6.5-7%.  Therefore, at today's visit, I suggested to not vary the dose of Toujeo based on the sugars in the morning but otherwise we discussed about continuing the same regimen.  She is finally able to obtain 3 months of Ozempic at the time after she switched from the [facility] pharmacy to the mail-[facility].  We sent the prescription  for the Accu-Chek FastClix lancets today. -The Dexcom CGM was not approved for her. - I advised her to: Patient Instructions  Please use: - Toujeo 28 units in a.m. - Ozempic 0.5 mg weekly in a.m.  Please return in 3-4 months with your sugar log.   - we checked her HbA1c: 8.1% (slightly lower) - advised to check sugars at different times of the day - 1x a day, rotating check times - advised for yearly eye exams >> she is UTD - return to clinic in 3-4 months  2. HL -Reviewed latest lipid panel from 10/2022: LDL much improved, triglycerides slightly elevated: Lab Results  Component Value Date   CHOL 147 10/18/2022   HDL 56.80 10/18/2022   LDLCALC 57 10/18/2022   LDLDIRECT 170.0 04/07/2022   TRIG 164.0 (H) 10/18/2022   CHOLHDL 3 10/18/2022  -She is on Crestor 40 mg daily without side effects  3.  Overweight -Will get the Ozempic which should also help with weight loss -Weight was stable at last visit, now increased by 3 pounds  4. PN -B12 level was normal at last check: Lab Results  Component Value Date   VITAMINB12 356 10/18/2022  -I again recommended that lipoic acid 600 mg twice a day -she did not start -At today's visit, she describes some changes in sensation on the inside of her upper right arm.  We discussed that this is unlikely to be related to diabetic neuropathy.  I advised her to discuss with PCP whether investigation for cervical spondylosis is necessary.  Carlus Pavlov, MD PhD [facility]

## 2022-12-17 NOTE — Patient Instructions (Signed)
Please use: - Lantus 28 units in a.m. - Ozempic 0.5 mg weekly in a.m.  Please return in 4 months with your sugar log.

## 2022-12-21 ENCOUNTER — Other Ambulatory Visit: Payer: Self-pay | Admitting: Internal Medicine

## 2022-12-22 ENCOUNTER — Other Ambulatory Visit (HOSPITAL_COMMUNITY): Payer: Self-pay

## 2022-12-22 MED ORDER — BUTALBITAL-APAP-CAFFEINE 50-325-40 MG PO TABS
1.0000 | ORAL_TABLET | Freq: Four times a day (QID) | ORAL | 3 refills | Status: DC | PRN
  Filled 2022-12-22: qty 30, 8d supply, fill #0
  Filled 2023-01-16: qty 30, 8d supply, fill #1

## 2022-12-22 MED ORDER — NYSTATIN-TRIAMCINOLONE 100000-0.1 UNIT/GM-% EX OINT
TOPICAL_OINTMENT | Freq: Two times a day (BID) | CUTANEOUS | 2 refills | Status: DC
  Filled 2022-12-22: qty 30, 10d supply, fill #0
  Filled 2023-01-16: qty 30, 10d supply, fill #1

## 2022-12-28 ENCOUNTER — Ambulatory Visit (HOSPITAL_COMMUNITY): Payer: Medicare HMO

## 2022-12-29 NOTE — Progress Notes (Deleted)
  Cardiology Office Note:  .   Date:  12/29/2022  ID:  Rachel [name], DOB 12-01-1965, MRN [id number] PCP: Rachel Levins, MD  [facility] Providers Cardiologist:  Parke Poisson, MD   History of Present Illness: .   Rachel [name] is a 57 y.o. female with a past medical history of GERD, migraines, moderate OSA. Per chart review, patient recently established care with Dr. Jacques [name] and presents today for a follow up appointment   Previous coronary CTA in 06/2014 showed a coronary calcium score of 10, no obstructive CAD.   Patient established care with Dr. Jacques [name] in 11/2022 for evaluation of palpitations. Patient wore a cardiac monitor that showed normal sinus rhythm with <1% PACs and <1% PVCs. There were no malignant rhythms noted. Symptoms were associated with sinus rhythm sinus tachycardia, and occasional ectopy. Patient was instructed to use propranolol PRN for palpitations. Also recommended treating her OSA. Echocardiogram was ordered but has not yet been completed  Palpitations  - Cardiac monitor in 09/2022 showed normal sinus rhythm with rare PVCs and PACs  - Patient has propranolol for palpitations  - K was 3.8 in 10/2022   OSA  - Likely that her OSA is contributing to palpitations  - Echocardiogram was ordered to evaluate pulmonary pressures, but this has not been completed yet  - Sleep study ***    ROS: ***  Studies Reviewed: .        *** Risk Assessment/Calculations:   {Does this patient have ATRIAL FIBRILLATION?:[phone]} No BP recorded.  {Refresh Note OR Click here to enter BP  :1}***       Physical Exam:   VS:  There were no vitals taken for this visit.   Wt Readings from Last 3 Encounters:  12/17/22 146 lb (66.2 kg)  12/08/22 145 lb (65.8 kg)  12/01/22 143 lb (64.9 kg)    GEN: Well nourished, well developed in no acute distress NECK: No JVD; No carotid bruits CARDIAC: ***RRR, no murmurs, rubs, gallops RESPIRATORY:  Clear to auscultation without  rales, wheezing or rhonchi  ABDOMEN: Soft, non-tender, non-distended EXTREMITIES:  No edema; No deformity   ASSESSMENT AND PLAN: .   ***    {Are you ordering a CV Procedure (e.g. stress test, cath, DCCV, TEE, etc)?   Press F2        :[id number]}  Dispo: ***  Signed, Jonita Albee, PA-C

## 2022-12-31 ENCOUNTER — Ambulatory Visit: Payer: Medicare HMO | Admitting: Cardiology

## 2023-01-07 ENCOUNTER — Telehealth (HOSPITAL_COMMUNITY): Payer: Self-pay | Admitting: Internal Medicine

## 2023-01-07 NOTE — Telephone Encounter (Signed)
Patient cancelled echocardiogram, see below:  12/24/22 Auto Confirm Status: TEXT NO [facility] to call back to reschedule/LBW 12:38   Order will be removed from the active echo WQ and when pt calls back to reschedule we can reinstate the order. Thank you.

## 2023-01-17 ENCOUNTER — Other Ambulatory Visit: Payer: Self-pay

## 2023-01-20 ENCOUNTER — Other Ambulatory Visit (HOSPITAL_COMMUNITY): Payer: Self-pay

## 2023-01-20 DIAGNOSIS — M810 Age-related osteoporosis without current pathological fracture: Secondary | ICD-10-CM | POA: Diagnosis not present

## 2023-01-20 DIAGNOSIS — Z9189 Other specified personal risk factors, not elsewhere classified: Secondary | ICD-10-CM | POA: Diagnosis not present

## 2023-01-20 DIAGNOSIS — Z01419 Encounter for gynecological examination (general) (routine) without abnormal findings: Secondary | ICD-10-CM | POA: Diagnosis not present

## 2023-01-20 DIAGNOSIS — N958 Other specified menopausal and perimenopausal disorders: Secondary | ICD-10-CM | POA: Diagnosis not present

## 2023-01-20 DIAGNOSIS — Z8639 Personal history of other endocrine, nutritional and metabolic disease: Secondary | ICD-10-CM | POA: Diagnosis not present

## 2023-01-20 MED ORDER — ESTRADIOL 0.1 MG/GM VA CREA
TOPICAL_CREAM | VAGINAL | 3 refills | Status: DC
  Filled 2023-01-20: qty 42.5, 90d supply, fill #0

## 2023-01-20 MED ORDER — PREMARIN 0.625 MG/GM VA CREA
TOPICAL_CREAM | VAGINAL | 3 refills | Status: DC
  Filled 2023-01-20: qty 30, 70d supply, fill #0

## 2023-01-23 ENCOUNTER — Other Ambulatory Visit: Payer: Self-pay | Admitting: Family Medicine

## 2023-01-23 ENCOUNTER — Other Ambulatory Visit: Payer: Self-pay | Admitting: Internal Medicine

## 2023-01-26 NOTE — Progress Notes (Deleted)
[name] [address] [place] [address] Phone: [phone] Subjective:    I'm seeing this patient by the request  of:  Corwin Levins, MD  CC:   [id number]  Rachel [name] is a 57 y.o. female coming in with complaint of back and neck pain. OMT 12/08/2022. Patient states   Medications patient has been prescribed: Zanaflex  Taking:         Reviewed prior external information including notes and imaging from previsou exam, outside providers and external EMR if available.   As well as notes that were available from care everywhere and other healthcare systems.  Past medical history, social, surgical and family history all reviewed in electronic medical record.  No pertanent information unless stated regarding to the chief complaint.   Past Medical History:  Diagnosis Date   ALLERGIC RHINITIS 10/04/2007   Anemia 01/21/2011   Anxiety 11/15/2018   ASTHMA 08/02/2007   INHALERS ONLY IN [place]   Asthma 08/02/2007   Qualifier: Diagnosis of  By: Nena Jordan    Blood transfusion without reported diagnosis    with first child    Cervical radiculopathy 01/21/2011   Cervicogenic headache 04/27/2017   COMMON MIGRAINE 05/15/2009   Depression 01/09/2010   Qualifier: Diagnosis of  By: Jonny Ruiz MD, Len Blalock    DIABETES MELLITUS, TYPE II 08/02/2007   Dysphagia, pharyngoesophageal phase 06/12/2014   GERD 08/02/2007   HYPERLIPIDEMIA 08/02/2007   Hyperlipidemia 08/02/2007   Qualifier: Diagnosis of  By: Nena Jordan    INSOMNIA-SLEEP DISORDER-UNSPEC 01/04/2008   INTERMITTENT VERTIGO 05/15/2009   LIBIDO, DECREASED 01/09/2010   Migraine without aura 05/15/2009   Qualifier: Diagnosis of  By: Jonny Ruiz MD, Len Blalock    Nonallopathic lesion of rib cage 12/21/2017   Nonallopathic lesion of sacral region 07/29/2015   Nonallopathic lesion of thoracic region 07/29/2015   Osteoporosis 04/25/2019   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   Rheumatoid factor  positive 02/10/2016   Right knee pain 01/31/2019   Injected January 31, 2019   Type 2 diabetes mellitus with hyperglycemia, with long-term current use of insulin (HCC) 08/02/2007   Qualifier: Diagnosis of  By: Nena Jordan     Allergies  Allergen Reactions   Lipitor [Atorvastatin]     REACTION: myalgias   Nitroglycerin      Review of Systems:  No headache, visual changes, nausea, vomiting, diarrhea, constipation, dizziness, abdominal pain, skin rash, fevers, chills, night sweats, weight loss, swollen lymph nodes, body aches, joint swelling, chest pain, shortness of breath, mood changes. POSITIVE muscle aches  Objective  There were no vitals taken for this visit.   General: No apparent distress alert and oriented x3 mood and affect normal, dressed appropriately.  HEENT: Pupils equal, extraocular movements intact  Respiratory: Patient's speak in full sentences and does not appear short of breath  Cardiovascular: No lower extremity edema, non tender, no erythema  Gait MSK:  Back   Osteopathic findings  C2 flexed rotated and side bent right C6 flexed rotated and side bent left T3 extended rotated and side bent right inhaled rib T9 extended rotated and side bent left L2 flexed rotated and side bent right Sacrum right on right       Assessment and Plan:  No problem-specific Assessment & Plan notes found for this encounter.    Nonallopathic problems  Decision today to treat with OMT was based on Physical Exam  After verbal consent patient was treated  with HVLA, ME, FPR techniques in cervical, rib, thoracic, lumbar, and sacral  areas  Patient tolerated the procedure well with improvement in symptoms  Patient given exercises, stretches and lifestyle modifications  See medications in patient instructions if given  Patient will follow up in 4-8 weeks             Note: This dictation was prepared with Dragon dictation along with smaller phrase technology. Any  transcriptional errors that result from this process are unintentional.

## 2023-01-27 ENCOUNTER — Ambulatory Visit: Payer: Medicare HMO | Admitting: Family Medicine

## 2023-01-27 ENCOUNTER — Telehealth: Payer: Self-pay

## 2023-01-27 NOTE — Telephone Encounter (Signed)
Patient had [facility] will need a PA before she can have BOtox

## 2023-01-28 ENCOUNTER — Other Ambulatory Visit (HOSPITAL_COMMUNITY): Payer: Self-pay

## 2023-01-28 ENCOUNTER — Ambulatory Visit: Payer: Medicare HMO | Admitting: Neurology

## 2023-01-28 ENCOUNTER — Telehealth: Payer: Self-pay | Admitting: Pharmacy Technician

## 2023-01-28 NOTE — Telephone Encounter (Signed)
[employer] verification has been submitted. Benefit Verification:   [id number]  Pharmacy PA has been submitted for BOTOX 200 via cmm. INSURANCE: [employer] DATE SUBMITTED: 8.16.24 KEY: [id number] Status is pending

## 2023-02-01 ENCOUNTER — Other Ambulatory Visit (HOSPITAL_COMMUNITY): Payer: Self-pay

## 2023-02-01 DIAGNOSIS — H10211 Acute toxic conjunctivitis, right eye: Secondary | ICD-10-CM | POA: Diagnosis not present

## 2023-02-01 DIAGNOSIS — H10212 Acute toxic conjunctivitis, left eye: Secondary | ICD-10-CM | POA: Diagnosis not present

## 2023-02-01 DIAGNOSIS — H10213 Acute toxic conjunctivitis, bilateral: Secondary | ICD-10-CM | POA: Diagnosis not present

## 2023-02-01 MED ORDER — FLUOROMETHOLONE 0.1 % OP SUSP
1.0000 [drp] | Freq: Four times a day (QID) | OPHTHALMIC | 0 refills | Status: AC
  Filled 2023-02-01: qty 5, 25d supply, fill #0

## 2023-02-01 NOTE — Progress Notes (Unsigned)
 [name] [address] [place] [address] Phone: [phone] Subjective:    I'm seeing this patient by the request  of:  Corwin Levins, MD  CC:   [id number]  Rachel [name] is a 57 y.o. female coming in with complaint of back and neck pain. OMT 12/08/2022. Patient states   Medications patient has been prescribed: Zanaflex  Taking:         Reviewed prior external information including notes and imaging from previsou exam, outside providers and external EMR if available.   As well as notes that were available from care everywhere and other healthcare systems.  Past medical history, social, surgical and family history all reviewed in electronic medical record.  No pertanent information unless stated regarding to the chief complaint.   Past Medical History:  Diagnosis Date   ALLERGIC RHINITIS 10/04/2007   Anemia 01/21/2011   Anxiety 11/15/2018   ASTHMA 08/02/2007   INHALERS ONLY IN [place]   Asthma 08/02/2007   Qualifier: Diagnosis of  By: Nena Jordan    Blood transfusion without reported diagnosis    with first child    Cervical radiculopathy 01/21/2011   Cervicogenic headache 04/27/2017   COMMON MIGRAINE 05/15/2009   Depression 01/09/2010   Qualifier: Diagnosis of  By: Jonny Ruiz MD, Len Blalock    DIABETES MELLITUS, TYPE II 08/02/2007   Dysphagia, pharyngoesophageal phase 06/12/2014   GERD 08/02/2007   HYPERLIPIDEMIA 08/02/2007   Hyperlipidemia 08/02/2007   Qualifier: Diagnosis of  By: Nena Jordan    INSOMNIA-SLEEP DISORDER-UNSPEC 01/04/2008   INTERMITTENT VERTIGO 05/15/2009   LIBIDO, DECREASED 01/09/2010   Migraine without aura 05/15/2009   Qualifier: Diagnosis of  By: Jonny Ruiz MD, Len Blalock    Nonallopathic lesion of rib cage 12/21/2017   Nonallopathic lesion of sacral region 07/29/2015   Nonallopathic lesion of thoracic region 07/29/2015   Osteoporosis 04/25/2019   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   Rheumatoid factor  positive 02/10/2016   Right knee pain 01/31/2019   Injected January 31, 2019   Type 2 diabetes mellitus with hyperglycemia, with long-term current use of insulin (HCC) 08/02/2007   Qualifier: Diagnosis of  By: Nena Jordan     Allergies  Allergen Reactions   Lipitor [Atorvastatin]     REACTION: myalgias   Nitroglycerin      Review of Systems:  No headache, visual changes, nausea, vomiting, diarrhea, constipation, dizziness, abdominal pain, skin rash, fevers, chills, night sweats, weight loss, swollen lymph nodes, body aches, joint swelling, chest pain, shortness of breath, mood changes. POSITIVE muscle aches  Objective  There were no vitals taken for this visit.   General: No apparent distress alert and oriented x3 mood and affect normal, dressed appropriately.  HEENT: Pupils equal, extraocular movements intact  Respiratory: Patient's speak in full sentences and does not appear short of breath  Cardiovascular: No lower extremity edema, non tender, no erythema  Gait MSK:  Back   Osteopathic findings  C2 flexed rotated and side bent right C6 flexed rotated and side bent left T3 extended rotated and side bent right inhaled rib T9 extended rotated and side bent left L2 flexed rotated and side bent right Sacrum right on right       Assessment and Plan:  No problem-specific Assessment & Plan notes found for this encounter.    Nonallopathic problems  Decision today to treat with OMT was based on Physical Exam  After verbal consent patient was treated  with HVLA, ME, FPR techniques in cervical, rib, thoracic, lumbar, and sacral  areas  Patient tolerated the procedure well with improvement in symptoms  Patient given exercises, stretches and lifestyle modifications  See medications in patient instructions if given  Patient will follow up in 4-8 weeks             Note: This dictation was prepared with Dragon dictation along with smaller phrase technology. Any  transcriptional errors that result from this process are unintentional.

## 2023-02-02 ENCOUNTER — Encounter: Payer: Self-pay | Admitting: Family Medicine

## 2023-02-02 ENCOUNTER — Ambulatory Visit: Payer: Medicare HMO | Admitting: Family Medicine

## 2023-02-02 VITALS — BP 112/78 | Ht 60.0 in | Wt 144.0 lb

## 2023-02-02 DIAGNOSIS — M9908 Segmental and somatic dysfunction of rib cage: Secondary | ICD-10-CM

## 2023-02-02 DIAGNOSIS — M9901 Segmental and somatic dysfunction of cervical region: Secondary | ICD-10-CM

## 2023-02-02 DIAGNOSIS — M9902 Segmental and somatic dysfunction of thoracic region: Secondary | ICD-10-CM | POA: Diagnosis not present

## 2023-02-02 DIAGNOSIS — M9903 Segmental and somatic dysfunction of lumbar region: Secondary | ICD-10-CM | POA: Diagnosis not present

## 2023-02-02 DIAGNOSIS — G4486 Cervicogenic headache: Secondary | ICD-10-CM | POA: Diagnosis not present

## 2023-02-02 DIAGNOSIS — M9904 Segmental and somatic dysfunction of sacral region: Secondary | ICD-10-CM

## 2023-02-02 MED ORDER — METHYLPREDNISOLONE ACETATE 40 MG/ML IJ SUSP
40.0000 mg | Freq: Once | INTRAMUSCULAR | Status: AC
Start: 2023-02-02 — End: 2023-02-02
  Administered 2023-02-02: 40 mg via INTRAMUSCULAR

## 2023-02-02 MED ORDER — KETOROLAC TROMETHAMINE 30 MG/ML IJ SOLN
30.0000 mg | Freq: Once | INTRAMUSCULAR | Status: AC
Start: 2023-02-02 — End: 2023-02-02
  Administered 2023-02-02: 30 mg via INTRAMUSCULAR

## 2023-02-02 NOTE — Assessment & Plan Note (Signed)
Worsening cervicogenic headaches as well as patient's chronic pain syndrome.  Patient is to continue to try to increase activity.  Will give injections of Toradol and Depo-Medrol secondary to the exacerbation.  Discussed icing regimen and home exercises.  We have done significant workup otherwise in patient's pain that has been fairly unremarkable lab would change medical management at this time.  Follow-up with me again in 5 to 6 weeks.  Does respond to osteopathic manipulation for some weeks.

## 2023-02-02 NOTE — Patient Instructions (Signed)
See me again in 5 weeks 

## 2023-02-03 ENCOUNTER — Other Ambulatory Visit: Payer: Self-pay

## 2023-02-03 MED ORDER — ESOMEPRAZOLE MAGNESIUM 40 MG PO CPDR: 40.0000 mg | DELAYED_RELEASE_CAPSULE | Freq: Two times a day (BID) | ORAL | 3 refills | Status: DC

## 2023-02-03 MED ORDER — BUTALBITAL-APAP-CAFFEINE 50-325-40 MG PO TABS: 1.0000 | ORAL_TABLET | Freq: Four times a day (QID) | ORAL | 3 refills | Status: DC | PRN

## 2023-02-03 MED ORDER — NYSTATIN-TRIAMCINOLONE 100000-0.1 UNIT/GM-% EX OINT: TOPICAL_OINTMENT | Freq: Two times a day (BID) | CUTANEOUS | 2 refills | Status: DC

## 2023-02-03 NOTE — Addendum Note (Signed)
Addended by: Daryll Brod on: 02/03/2023 02:48 PM   Modules accepted: Orders

## 2023-02-03 NOTE — Addendum Note (Signed)
Addended by: Daryll Brod on: 02/03/2023 02:52 PM   Modules accepted: Orders

## 2023-02-03 NOTE — Progress Notes (Signed)
Sorry, I dont see a request or question

## 2023-02-04 ENCOUNTER — Other Ambulatory Visit (HOSPITAL_COMMUNITY): Payer: Self-pay

## 2023-02-04 MED ORDER — PREDNISOLONE ACETATE 1 % OP SUSP
1.0000 [drp] | Freq: Four times a day (QID) | OPHTHALMIC | 0 refills | Status: AC
  Filled 2023-02-04: qty 5, 7d supply, fill #0

## 2023-02-07 DIAGNOSIS — H10211 Acute toxic conjunctivitis, right eye: Secondary | ICD-10-CM | POA: Diagnosis not present

## 2023-02-07 DIAGNOSIS — H10213 Acute toxic conjunctivitis, bilateral: Secondary | ICD-10-CM | POA: Diagnosis not present

## 2023-02-07 DIAGNOSIS — H10212 Acute toxic conjunctivitis, left eye: Secondary | ICD-10-CM | POA: Diagnosis not present

## 2023-02-08 ENCOUNTER — Other Ambulatory Visit (HOSPITAL_COMMUNITY): Payer: Self-pay

## 2023-02-08 ENCOUNTER — Other Ambulatory Visit: Payer: Self-pay

## 2023-02-08 ENCOUNTER — Telehealth: Payer: Self-pay | Admitting: Internal Medicine

## 2023-02-08 DIAGNOSIS — J358 Other chronic diseases of tonsils and adenoids: Secondary | ICD-10-CM | POA: Diagnosis not present

## 2023-02-08 MED ORDER — BOTOX 200 UNITS IJ SOLR: INTRAMUSCULAR | 4 refills | Status: DC

## 2023-02-08 MED ORDER — ROSUVASTATIN CALCIUM 20 MG PO TABS: 20.0000 mg | ORAL_TABLET | Freq: Every day | ORAL | 3 refills | Status: DC

## 2023-02-08 NOTE — Telephone Encounter (Signed)
Prescription Request  02/08/2023  LOV: 10/18/2022  What is the name of the medication or equipment? Rosuvastain 20 mg.  Have you contacted your pharmacy to request a refill? Yes   Which pharmacy would you like this sent to?  [facility] - [place], [place] - [address] [place] [address] Phone: [phone] Fax: [phone]    Patient notified that their request is being sent to the clinical staff for review and that they should receive a response within 2 business days.   Please advise at Mobile [phone] (mobile)

## 2023-02-08 NOTE — Telephone Encounter (Signed)
Refill sent.

## 2023-02-09 ENCOUNTER — Other Ambulatory Visit (HOSPITAL_COMMUNITY): Payer: Self-pay

## 2023-02-09 NOTE — Telephone Encounter (Signed)
Pharmacy Patient Advocate Encounter  Received notification from [facility] that Prior Authorization for Botox 200UNIT solution has been APPROVED from 02-04-2023 to 06-14-2023   PA #/Case ID/Reference #: [id number]

## 2023-02-18 ENCOUNTER — Telehealth: Payer: Self-pay | Admitting: Neurology

## 2023-02-18 NOTE — Telephone Encounter (Signed)
Per patient, she was advised the botox order was entered wrong.  Advised patient script was sent to the specialty pharmacy as we always do.   We will call to get delivery set up. Please just give consent to have it delivered to the office.    PA team can you call [employer] and check on this please.

## 2023-02-18 NOTE — Telephone Encounter (Signed)
Caller stated pharmacy is wanting to charge $200 for co-pay with getting Botox medication. Stated pharmacy needs a part B and not part D

## 2023-02-28 ENCOUNTER — Telehealth: Payer: Self-pay | Admitting: Neurology

## 2023-02-28 ENCOUNTER — Other Ambulatory Visit (HOSPITAL_COMMUNITY): Payer: Self-pay

## 2023-02-28 DIAGNOSIS — J3501 Chronic tonsillitis: Secondary | ICD-10-CM | POA: Diagnosis not present

## 2023-02-28 DIAGNOSIS — J358 Other chronic diseases of tonsils and adenoids: Secondary | ICD-10-CM | POA: Diagnosis not present

## 2023-02-28 MED ORDER — HYDROCODONE-ACETAMINOPHEN 7.5-325 MG/15ML PO SOLN
7.5000 mg | ORAL | 0 refills | Status: DC | PRN
Start: 2023-02-28 — End: 2023-09-16
  Filled 2023-02-28: qty 473, 4d supply, fill #0

## 2023-02-28 NOTE — Telephone Encounter (Signed)
Left message with the after hour service on 02-28-23 at 12:53 pm   Caller states that they are from the pharmacy and need to set up delivery for patient

## 2023-03-02 NOTE — Telephone Encounter (Signed)
Tried to call so schedule a delivery for Botox. Per rep the patient copay has gone up and the patient called on the 17th to ask to be placed on a payment plan. But never called back to set it up.  Rep called patient on the other line to set up payment plan. Per patient husband Mrs. Rachel [name] had surgery on Monday and they are unable to do that right now.  Botox placed on hold by pharmacy.   Spoke to the patient and the husband please reschedule until they are able to set up payment plan.  Appt reschedule for 10/18. Can change once patient calls to advise of the of delivery set.    I offered the patient to discuss with dr.Jaffe something she can have until she can be seen or switch to if they are unable to afford.

## 2023-03-03 ENCOUNTER — Other Ambulatory Visit (HOSPITAL_COMMUNITY): Payer: Self-pay

## 2023-03-03 MED ORDER — HYDROCODONE-ACETAMINOPHEN 7.5-325 MG/15ML PO SOLN
15.0000 mL | ORAL | 0 refills | Status: DC | PRN
Start: 2023-03-03 — End: 2023-03-29
  Filled 2023-03-03: qty 250, 2d supply, fill #0

## 2023-03-04 ENCOUNTER — Ambulatory Visit: Payer: Medicare HMO | Admitting: Neurology

## 2023-03-07 ENCOUNTER — Other Ambulatory Visit (HOSPITAL_COMMUNITY): Payer: Self-pay

## 2023-03-08 MED ORDER — CARBAMAZEPINE 200 MG PO TABS: ORAL_TABLET | ORAL | 3 refills | Status: AC

## 2023-03-08 NOTE — Telephone Encounter (Signed)
Per Dr.Jaffe, If Botox is not an option, I can offer starting a different seizure medication used for migraine prevention that she hasn't tried yet:  If agreeable, we can start carbamazepine.  Please send prescription as follows: CARBAMAZEPINE 200MG  TAB:  TAKE 1 TABLET TWICE DAILY FOR ONE WEEK, THEN INCREASE TO 1 TABLET THREE TIMES DAILY.

## 2023-03-08 NOTE — Progress Notes (Deleted)
[name] [address] [place] [address] Phone: [phone] Subjective:    I'm seeing this patient by the request  of:  Corwin Levins, MD  CC:   [id number]  Rachel [name] is a 57 y.o. female coming in with complaint of back and neck pain. OMT 02/02/2023. Patient states   Medications patient has been prescribed: Zanaflex  Taking:         Reviewed prior external information including notes and imaging from previsou exam, outside providers and external EMR if available.   As well as notes that were available from care everywhere and other healthcare systems.  Past medical history, social, surgical and family history all reviewed in electronic medical record.  No pertanent information unless stated regarding to the chief complaint.   Past Medical History:  Diagnosis Date   ALLERGIC RHINITIS 10/04/2007   Anemia 01/21/2011   Anxiety 11/15/2018   ASTHMA 08/02/2007   INHALERS ONLY IN [place]   Asthma 08/02/2007   Qualifier: Diagnosis of  By: Nena Jordan    Blood transfusion without reported diagnosis    with first child    Cervical radiculopathy 01/21/2011   Cervicogenic headache 04/27/2017   COMMON MIGRAINE 05/15/2009   Depression 01/09/2010   Qualifier: Diagnosis of  By: Jonny Ruiz MD, Len Blalock    DIABETES MELLITUS, TYPE II 08/02/2007   Dysphagia, pharyngoesophageal phase 06/12/2014   GERD 08/02/2007   HYPERLIPIDEMIA 08/02/2007   Hyperlipidemia 08/02/2007   Qualifier: Diagnosis of  By: Nena Jordan    INSOMNIA-SLEEP DISORDER-UNSPEC 01/04/2008   INTERMITTENT VERTIGO 05/15/2009   LIBIDO, DECREASED 01/09/2010   Migraine without aura 05/15/2009   Qualifier: Diagnosis of  By: Jonny Ruiz MD, Len Blalock    Nonallopathic lesion of rib cage 12/21/2017   Nonallopathic lesion of sacral region 07/29/2015   Nonallopathic lesion of thoracic region 07/29/2015   Osteoporosis 04/25/2019   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   Rheumatoid factor  positive 02/10/2016   Right knee pain 01/31/2019   Injected January 31, 2019   Type 2 diabetes mellitus with hyperglycemia, with long-term current use of insulin (HCC) 08/02/2007   Qualifier: Diagnosis of  By: Nena Jordan     Allergies  Allergen Reactions   Lipitor [Atorvastatin]     REACTION: myalgias   Nitroglycerin      Review of Systems:  No headache, visual changes, nausea, vomiting, diarrhea, constipation, dizziness, abdominal pain, skin rash, fevers, chills, night sweats, weight loss, swollen lymph nodes, body aches, joint swelling, chest pain, shortness of breath, mood changes. POSITIVE muscle aches  Objective  There were no vitals taken for this visit.   General: No apparent distress alert and oriented x3 mood and affect normal, dressed appropriately.  HEENT: Pupils equal, extraocular movements intact  Respiratory: Patient's speak in full sentences and does not appear short of breath  Cardiovascular: No lower extremity edema, non tender, no erythema  Gait MSK:  Back   Osteopathic findings  C2 flexed rotated and side bent right C6 flexed rotated and side bent left T3 extended rotated and side bent right inhaled rib T9 extended rotated and side bent left L2 flexed rotated and side bent right Sacrum right on right       Assessment and Plan:  No problem-specific Assessment & Plan notes found for this encounter.    Nonallopathic problems  Decision today to treat with OMT was based on Physical Exam  After verbal consent patient was treated  with HVLA, ME, FPR techniques in cervical, rib, thoracic, lumbar, and sacral  areas  Patient tolerated the procedure well with improvement in symptoms  Patient given exercises, stretches and lifestyle modifications  See medications in patient instructions if given  Patient will follow up in 4-8 weeks             Note: This dictation was prepared with Dragon dictation along with smaller phrase technology. Any  transcriptional errors that result from this process are unintentional.

## 2023-03-09 ENCOUNTER — Ambulatory Visit: Payer: Medicare HMO | Admitting: Family Medicine

## 2023-03-10 DIAGNOSIS — H2513 Age-related nuclear cataract, bilateral: Secondary | ICD-10-CM | POA: Diagnosis not present

## 2023-03-10 DIAGNOSIS — E119 Type 2 diabetes mellitus without complications: Secondary | ICD-10-CM | POA: Diagnosis not present

## 2023-03-10 DIAGNOSIS — H1045 Other chronic allergic conjunctivitis: Secondary | ICD-10-CM | POA: Diagnosis not present

## 2023-03-10 DIAGNOSIS — Z0100 Encounter for examination of eyes and vision without abnormal findings: Secondary | ICD-10-CM | POA: Diagnosis not present

## 2023-03-10 LAB — HM DIABETES EYE EXAM

## 2023-03-15 ENCOUNTER — Other Ambulatory Visit (HOSPITAL_COMMUNITY): Payer: Self-pay

## 2023-03-15 ENCOUNTER — Telehealth: Payer: Self-pay

## 2023-03-15 NOTE — Telephone Encounter (Signed)
Pharmacy Patient Advocate Encounter   Received notification from [employer] that prior authorization for Ozempic is required/requested.   Insurance verification completed.   The patient is insured through [employer] .   Per test claim: The current 30 day co-pay is, $227.68.  No PA needed at this time. This test claim was processed through [facility]- copay amounts may vary at other pharmacies due to pharmacy/plan contracts, or as the patient moves through the different stages of their insurance plan.     Pt is in a donut hole. No PA required

## 2023-03-15 NOTE — Telephone Encounter (Signed)
Botox is covered with a $303.62 co-pay. Patient is in the coverage gap/donut hole.

## 2023-03-21 NOTE — Telephone Encounter (Signed)
LMOVm for patient, Should we cancel her botox appt 10/18 and continue on the new medication or keep the Botox appt?

## 2023-03-22 NOTE — Progress Notes (Signed)
Medication was refilled 8/22

## 2023-03-23 ENCOUNTER — Telehealth: Payer: Self-pay | Admitting: Neurology

## 2023-03-23 NOTE — Progress Notes (Unsigned)
[name] [address] [place] [address] Phone: [phone] Subjective:   Rachel [name], am serving as a scribe for Dr. Antoine Primas.  I'm seeing this patient by the request  of:  Corwin Levins, MD  CC: Back, neck and headache follow-up  [id number]  Rachel [name] is a 57 y.o. female coming in with complaint of back and neck pain. OMT on 02/02/2023. Patient states routine OMT  Medications patient has been prescribed: Zanaflex  Taking:  Reviewing patient's chart has been approved for Ozempic it would cost $227.   Patient has seen neurology and given carbamazepine to see if patient would respond with Botox not being a possibility.    Reviewed prior external information including notes and imaging from previsou exam, outside providers and external EMR if available.   As well as notes that were available from care everywhere and other healthcare systems.  Past medical history, social, surgical and family history all reviewed in electronic medical record.  No pertanent information unless stated regarding to the chief complaint.   Past Medical History:  Diagnosis Date   ALLERGIC RHINITIS 10/04/2007   Anemia 01/21/2011   Anxiety 11/15/2018   ASTHMA 08/02/2007   INHALERS ONLY IN [place]   Asthma 08/02/2007   Qualifier: Diagnosis of  By: Nena Jordan    Blood transfusion without reported diagnosis    with first child    Cervical radiculopathy 01/21/2011   Cervicogenic headache 04/27/2017   COMMON MIGRAINE 05/15/2009   Depression 01/09/2010   Qualifier: Diagnosis of  By: Jonny Ruiz MD, Len Blalock    DIABETES MELLITUS, TYPE II 08/02/2007   Dysphagia, pharyngoesophageal phase 06/12/2014   GERD 08/02/2007   HYPERLIPIDEMIA 08/02/2007   Hyperlipidemia 08/02/2007   Qualifier: Diagnosis of  By: Nena Jordan    INSOMNIA-SLEEP DISORDER-UNSPEC 01/04/2008   INTERMITTENT VERTIGO 05/15/2009   LIBIDO, DECREASED 01/09/2010   Migraine without aura  05/15/2009   Qualifier: Diagnosis of  By: Jonny Ruiz MD, Len Blalock    Nonallopathic lesion of rib cage 12/21/2017   Nonallopathic lesion of sacral region 07/29/2015   Nonallopathic lesion of thoracic region 07/29/2015   Osteoporosis 04/25/2019   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   Rheumatoid factor positive 02/10/2016   Right knee pain 01/31/2019   Injected January 31, 2019   Type 2 diabetes mellitus with hyperglycemia, with long-term current use of insulin (HCC) 08/02/2007   Qualifier: Diagnosis of  By: Nena Jordan     Allergies  Allergen Reactions   Lipitor [Atorvastatin]     REACTION: myalgias   Nitroglycerin      Review of Systems:  No visual changes, nausea, vomiting, diarrhea, constipation, dizziness, abdominal pain, skin rash, fevers, chills, night sweats, weight loss, swollen lymph nodes, , joint swelling, chest pain, shortness of breath, mood changes. POSITIVE muscle aches, body aches, headaches  Objective  Blood pressure 110/70, pulse (!) 108, height 5' (1.524 m), weight 148 lb (67.1 kg), SpO2 98%.   General: No apparent distress alert and oriented x3 mood and affect normal, dressed appropriately.  HEENT: Pupils equal, extraocular movements intact  Respiratory: Patient's speak in full sentences and does not appear short of breath  Cardiovascular: No lower extremity edema, non tender, no erythema  Back does have some loss of lordosis but tightness noted in the neck.  Cervicogenic type tightness at the occipital area.  Osteopathic findings  C2 flexed rotated and side bent right C3 flexed rotated and side  bent left C6 flexed rotated and side bent left T3 extended rotated and side bent right inhaled rib T9 extended rotated and side bent left L2 flexed rotated and side bent right Sacrum right on right       Assessment and Plan:  Cervical stenosis of spine Known spinal stenosis noted.  Discussed icing regimen and home exercises.  Continue to stay active.   Patient did do well with a tonsillectomy but does have some swelling noted.  Had been sometime since we have seen her.  Follow-up again in 6 to 8 weeks otherwise.  Cervicogenic headache New current therapy, gabapentin states patient is doing better with this and is no longer taking any of the pain medications.  Patient is going to continue to stay active.  Responds relatively well to osteopathic manipulation.  Follow-up again in 6 to 8 weeks    Nonallopathic problems  Decision today to treat with OMT was based on Physical Exam  After verbal consent patient was treated with HVLA, ME, FPR techniques in cervical, rib, thoracic, lumbar, and sacral  areas  Patient tolerated the procedure well with improvement in symptoms  Patient given exercises, stretches and lifestyle modifications  See medications in patient instructions if given  Patient will follow up in 4-8 weeks    The above documentation has been reviewed and is accurate and complete Judi Saa, DO          Note: This dictation was prepared with Dragon dictation along with smaller phrase technology. Any transcriptional errors that result from this process are unintentional.

## 2023-03-23 NOTE — Telephone Encounter (Signed)
Per patient she tried the new medication and made really nauseous and upset stomach. Patient unable to afford the botox copay would like to cancel that appt please.     Please advise.   Front desk please cancel patient botox appt.

## 2023-03-23 NOTE — Telephone Encounter (Signed)
EROR

## 2023-03-23 NOTE — Telephone Encounter (Signed)
Pt called in and left a message. She states she was returning a call to Dr. Moises Blood nurse.

## 2023-03-24 ENCOUNTER — Encounter: Payer: Self-pay | Admitting: Family Medicine

## 2023-03-24 ENCOUNTER — Ambulatory Visit: Payer: Medicare HMO | Admitting: Family Medicine

## 2023-03-24 VITALS — BP 110/70 | HR 108 | Ht 60.0 in | Wt 148.0 lb

## 2023-03-24 DIAGNOSIS — M9901 Segmental and somatic dysfunction of cervical region: Secondary | ICD-10-CM

## 2023-03-24 DIAGNOSIS — M4802 Spinal stenosis, cervical region: Secondary | ICD-10-CM | POA: Diagnosis not present

## 2023-03-24 DIAGNOSIS — M9908 Segmental and somatic dysfunction of rib cage: Secondary | ICD-10-CM | POA: Diagnosis not present

## 2023-03-24 DIAGNOSIS — M9902 Segmental and somatic dysfunction of thoracic region: Secondary | ICD-10-CM

## 2023-03-24 DIAGNOSIS — M9903 Segmental and somatic dysfunction of lumbar region: Secondary | ICD-10-CM | POA: Diagnosis not present

## 2023-03-24 DIAGNOSIS — M9904 Segmental and somatic dysfunction of sacral region: Secondary | ICD-10-CM

## 2023-03-24 DIAGNOSIS — G4486 Cervicogenic headache: Secondary | ICD-10-CM | POA: Diagnosis not present

## 2023-03-24 NOTE — Patient Instructions (Addendum)
Good to see you  Hope the surgery went well  Follow up in 6-8 weeks

## 2023-03-24 NOTE — Assessment & Plan Note (Signed)
New current therapy, gabapentin states patient is doing better with this and is no longer taking any of the pain medications.  Patient is going to continue to stay active.  Responds relatively well to osteopathic manipulation.  Follow-up again in 6 to 8 weeks

## 2023-03-24 NOTE — Assessment & Plan Note (Signed)
Known spinal stenosis noted.  Discussed icing regimen and home exercises.  Continue to stay active.  Patient did do well with a tonsillectomy but does have some swelling noted.  Had been sometime since we have seen her.  Follow-up again in 6 to 8 weeks otherwise.

## 2023-03-29 DIAGNOSIS — J358 Other chronic diseases of tonsils and adenoids: Secondary | ICD-10-CM | POA: Diagnosis not present

## 2023-03-29 DIAGNOSIS — Z48815 Encounter for surgical aftercare following surgery on the digestive system: Secondary | ICD-10-CM | POA: Diagnosis not present

## 2023-03-29 DIAGNOSIS — G4733 Obstructive sleep apnea (adult) (pediatric): Secondary | ICD-10-CM | POA: Diagnosis not present

## 2023-04-01 ENCOUNTER — Ambulatory Visit: Payer: Medicare HMO | Admitting: Neurology

## 2023-04-12 ENCOUNTER — Other Ambulatory Visit: Payer: Self-pay

## 2023-04-12 ENCOUNTER — Other Ambulatory Visit: Payer: Self-pay | Admitting: Internal Medicine

## 2023-04-19 ENCOUNTER — Ambulatory Visit: Payer: Medicare HMO | Admitting: Internal Medicine

## 2023-04-19 ENCOUNTER — Other Ambulatory Visit (HOSPITAL_COMMUNITY): Payer: Self-pay

## 2023-04-19 VITALS — BP 124/76 | HR 80 | Temp 99.6°F | Ht 60.0 in | Wt 145.0 lb

## 2023-04-19 DIAGNOSIS — J329 Chronic sinusitis, unspecified: Secondary | ICD-10-CM | POA: Diagnosis not present

## 2023-04-19 DIAGNOSIS — Z794 Long term (current) use of insulin: Secondary | ICD-10-CM

## 2023-04-19 DIAGNOSIS — M810 Age-related osteoporosis without current pathological fracture: Secondary | ICD-10-CM | POA: Diagnosis not present

## 2023-04-19 DIAGNOSIS — E538 Deficiency of other specified B group vitamins: Secondary | ICD-10-CM | POA: Diagnosis not present

## 2023-04-19 DIAGNOSIS — E782 Mixed hyperlipidemia: Secondary | ICD-10-CM

## 2023-04-19 DIAGNOSIS — J4520 Mild intermittent asthma, uncomplicated: Secondary | ICD-10-CM | POA: Diagnosis not present

## 2023-04-19 DIAGNOSIS — E1165 Type 2 diabetes mellitus with hyperglycemia: Secondary | ICD-10-CM | POA: Diagnosis not present

## 2023-04-19 DIAGNOSIS — E559 Vitamin D deficiency, unspecified: Secondary | ICD-10-CM

## 2023-04-19 MED ORDER — ZOSTER VAC RECOMB ADJUVANTED 50 MCG/0.5ML IM SUSR
0.5000 mL | Freq: Once | INTRAMUSCULAR | 0 refills | Status: AC
  Filled 2023-04-19: qty 0.5, 1d supply, fill #0

## 2023-04-19 MED ORDER — AMOXICILLIN-POT CLAVULANATE 875-125 MG PO TABS
1.0000 | ORAL_TABLET | Freq: Two times a day (BID) | ORAL | 0 refills | Status: DC
  Filled 2023-04-19: qty 20, 10d supply, fill #0

## 2023-04-19 NOTE — Patient Instructions (Addendum)
Please have your Shingrix (shingles) shots done at your local pharmacy - [facility]  Please take all new medication as prescribed - the antibiotic  You will be contacted regarding the referral for:  CT sinus - at [facility]  We we will need to start Prolia for the bones, and hopefully you should be contacted soon  Please continue all other medications as before, and refills have been done if requested.  Please have the pharmacy call with any other refills you may need.  Please continue your efforts at being more active, low cholesterol diet, and weight control.  Please keep your appointments with your specialists as you may have planned - Endo next week  Please make an Appointment to return in 6 months, or sooner if needed, also with Lab Appointment for testing done 3-5 days before at the FIRST FLOOR Lab (so this is for TWO appointments - please see the scheduling desk as you leave)

## 2023-04-19 NOTE — Progress Notes (Unsigned)
Patient ID: Rachel [name], female   DOB: 1966-02-27, 57 y.o.   MRN: [id number]        Chief Complaint: follow up URI sinusitis recurring, dm, osteoporosis       HPI:  Rachel [name] is a 57 y.o. female  Here with 2-3 days acute onset fever, facial pain, pressure, headache, general weakness and malaise, and greenish d/c, with mild ST and cough, but pt denies chest pain, wheezing, increased sob or doe, orthopnea, PND, increased LE swelling, palpitations, dizziness or syncope.   Pt denies polydipsia, polyuria, or new focal neuro s/s.   Has occasional falls for unclear reasons, and hx of osteoporosis per last DXA per GYN and referred here for tx.  Has endo f/u next month. Wt Readings from Last 3 Encounters:  04/19/23 145 lb (65.8 kg)  03/24/23 148 lb (67.1 kg)  02/02/23 144 lb (65.3 kg)   BP Readings from Last 3 Encounters:  04/19/23 124/76  03/24/23 110/70  02/02/23 112/78         Past Medical History:  Diagnosis Date   ALLERGIC RHINITIS 10/04/2007   Anemia 01/21/2011   Anxiety 11/15/2018   ASTHMA 08/02/2007   INHALERS ONLY IN [place]   Asthma 08/02/2007   Qualifier: Diagnosis of  By: Nena Jordan    Blood transfusion without reported diagnosis    with first child    Cervical radiculopathy 01/21/2011   Cervicogenic headache 04/27/2017   COMMON MIGRAINE 05/15/2009   Depression 01/09/2010   Qualifier: Diagnosis of  By: Jonny Ruiz MD, Len Blalock    DIABETES MELLITUS, TYPE II 08/02/2007   Dysphagia, pharyngoesophageal phase 06/12/2014   GERD 08/02/2007   HYPERLIPIDEMIA 08/02/2007   Hyperlipidemia 08/02/2007   Qualifier: Diagnosis of  By: Nena Jordan    INSOMNIA-SLEEP DISORDER-UNSPEC 01/04/2008   INTERMITTENT VERTIGO 05/15/2009   LIBIDO, DECREASED 01/09/2010   Migraine without aura 05/15/2009   Qualifier: Diagnosis of  By: Jonny Ruiz MD, Len Blalock    Nonallopathic lesion of rib cage 12/21/2017   Nonallopathic lesion of sacral region 07/29/2015   Nonallopathic lesion of thoracic region 07/29/2015    Osteoporosis 04/25/2019   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   Rheumatoid factor positive 02/10/2016   Right knee pain 01/31/2019   Injected January 31, 2019   Type 2 diabetes mellitus with hyperglycemia, with long-term current use of insulin (HCC) 08/02/2007   Qualifier: Diagnosis of  By: Nena Jordan    Past Surgical History:  Procedure Laterality Date   CESAREAN SECTION     x 3   CYST EXCISION     left knee   PAROTIDECTOMY  10/21/2011   Procedure: PAROTIDECTOMY;  Surgeon: Christia Reading, MD;  Location: [facility] OR;  Service: ENT;  Laterality: Left;    reports that she has never smoked. She has never used smokeless tobacco. She reports that she does not drink alcohol and does not use drugs. family history includes Asthma in her father; Cervical cancer in her maternal aunt; Diabetes in her father; Heart disease in her maternal aunt; Hypertension in her father. Allergies  Allergen Reactions   Lipitor [Atorvastatin]     REACTION: myalgias   Nitroglycerin    Current Outpatient Medications on File Prior to Visit  Medication Sig Dispense Refill   Accu-Chek FastClix Lancets MISC Check blood glucose twice daily 204 each 3   aspirin 81 MG EC tablet Take 81 mg by mouth daily.     Blood Glucose Monitoring Suppl (ACCU-CHEK GUIDE) w/Device KIT  Use as instructed to check blood sugar 2 times daily 1 kit 0   botulinum toxin Type A (BOTOX) 200 units injection Inject 155 units IM Into multiple sites in the face,neck,and head every 90 days 1 each 4   butalbital-acetaminophen-caffeine (FIORICET) 50-325-40 MG tablet Take 1 tablet by mouth every 6 (six) hours as needed for headache. Max 2 tablets per day 30 tablet 3   carbamazepine (TEGRETOL) 200 MG tablet TAKE 1 TABLET TWICE DAILY FOR ONE WEEK, THEN INCREASE TO 1 TABLET THREE TIMES DAILY. 90 tablet 3   Cholecalciferol (VITAMIN D) 50 MCG (2000 UT) CAPS Take 2 capsules (4,000 Units total) by mouth daily. 90 capsule 3   Continuous Glucose Sensor  (DEXCOM G7 SENSOR) MISC Apply 1 sensor every 10 days 9 each 4   Diclofenac Sodium (PENNSAID) 2 % SOLN Place 2 g onto the skin 2 (two) times daily. 112 g 3   DULoxetine (CYMBALTA) 60 MG capsule Take 1 capsule (60 mg total) by mouth daily. 90 capsule 3   esomeprazole (NEXIUM) 40 MG capsule Take 1 capsule (40 mg total) by mouth 2 (two) times daily before a meal. 180 capsule 3   fluorometholone (FML) 0.1 % ophthalmic suspension Place 1 drop into both eyes 4 (four) times daily. 5 mL 0   Fluticasone-Umeclidin-Vilant (TRELEGY ELLIPTA) 100-62.5-25 MCG/ACT AEPB Inhale 1 puff into the lungs daily. 60 each 11   gabapentin (NEURONTIN) 300 MG capsule Take 2 capsules (600 mg total) by mouth 2 (two) times daily. 120 capsule 3   glucose blood (ACCU-CHEK GUIDE) test strip Use to check blood sugar 2 (two) times daily as directed 200 each 3   HYDROcodone-acetaminophen (HYCET) 7.5-325 mg/15 ml solution Take 15-20 mL (7.5-10 mg of hydrocodone total) by mouth every 4 (four) hours as needed for severe pain (7-10). 473 mL 0   insulin glargine, 1 Unit Dial, (TOUJEO SOLOSTAR) 300 UNIT/ML Solostar Pen Inject 30 Units into the skin daily. 30 mL 2   linaclotide (LINZESS) 145 MCG CAPS capsule Take 1 capsule (145 mcg total) by mouth daily before breakfast 30 capsule 2   metoCLOPramide (REGLAN) 5 MG tablet Take 1 tablet (5 mg total) by mouth 3 (three) times daily 30 minutes before meals 90 tablet 1   Multiple Vitamin (MULTIVITAMIN WITH MINERALS) TABS tablet Take 1 tablet by mouth daily.     neomycin-polymyxin-hydrocortisone (CORTISPORIN) OTIC solution Place 4 drops into the left ear 4 (four) times daily. 10 mL 0   nystatin-triamcinolone ointment (MYCOLOG) APPLY TO THE AFFECTED AREA(S) TWICE DAILY 30 g 11   ondansetron (ZOFRAN ODT) 8 MG disintegrating tablet Take 1 tablet (8 mg total) by mouth every 8 (eight) hours as needed for nausea or vomiting. 30 tablet 0   ondansetron (ZOFRAN) 4 MG tablet Take 1 tablet (4 mg total) by mouth  every 8 (eight) hours as needed for nausea & vomiting 40 tablet 1   prednisoLONE acetate (PRED FORTE) 1 % ophthalmic suspension Place 1 drop into both eyes 4 (four) times daily. 5 mL 0   predniSONE (DELTASONE) 10 MG tablet take 3 tabs by mouth a day for 3 days, then 2 tabs a day for 3 days, then 1 tab per day for 3 days 18 tablet 0   propranolol (INDERAL) 20 MG tablet Take 1.5 tablets (30 mg total) by mouth at bedtime. 90 tablet 1   rosuvastatin (CRESTOR) 20 MG tablet Take 1 tablet (20 mg total) by mouth daily. 90 tablet 3   Semaglutide,0.25 or 0.5MG /DOS, (OZEMPIC,  0.25 OR 0.5 MG/DOSE,) 2 MG/3ML SOPN Inject 0.5 mg into the skin once a week. 9 mL 3   tiZANidine (ZANAFLEX) 4 MG tablet Take 1 tablet (4 mg total) by mouth every 8 (eight) hours as needed for muscle spasms. 90 tablet 5   tiZANidine (ZANAFLEX) 4 MG tablet Take 1 tablet (4 mg total) by mouth at bedtime. 90 tablet 0   tiZANidine (ZANAFLEX) 4 MG tablet TAKE 1 TABLET AT BEDTIME 90 tablet 3   triamcinolone (NASACORT) 55 MCG/ACT AERO nasal inhaler Place 2 sprays into the nose daily. 1 Inhaler 12   Ubrogepant (UBRELVY) 100 MG TABS Take 1 tablet (100 mg total) by mouth as needed. May repeat after 2 hours.  Maximum 2 tablets in 24 hours. 10 tablet 11   No current facility-administered medications on file prior to visit.        ROS:  All others reviewed and negative.  Objective        PE:  BP 124/76 (BP Location: Right Arm, Patient Position: Sitting, Cuff Size: Normal)   Pulse 80   Temp 99.6 F (37.6 C) (Oral)   Ht 5' (1.524 m)   Wt 145 lb (65.8 kg)   SpO2 99%   BMI 28.32 kg/m                 Constitutional: Pt appears in NAD               HENT: Head: NCAT.                Right Ear: External ear normal.                 Left Ear: External ear normal. Bilat tm's with mild erythema.  Max sinus areas mild tender.  Pharynx with mild erythema, no exudate                Eyes: . Pupils are equal, round, and reactive to light. Conjunctivae and  EOM are normal               Nose: without d/c or deformity               Neck: Neck supple. Gross normal ROM               Cardiovascular: Normal rate and regular rhythm.                 Pulmonary/Chest: Effort normal and breath sounds without rales or wheezing.                Abd:  Soft, NT, ND, + BS, no organomegaly               Neurological: Pt is alert. At baseline orientation, motor grossly intact               Skin: Skin is warm. No rashes, no other new lesions, LE edema - none               Psychiatric: Pt behavior is normal without agitation   Micro: none  Cardiac tracings I have personally interpreted today:  none  Pertinent Radiological findings (summarize): none   Lab Results  Component Value Date   WBC 7.8 10/18/2022   HGB 12.0 10/18/2022   HCT 36.1 10/18/2022   PLT 368.0 10/18/2022   GLUCOSE 175 (H) 10/18/2022   CHOL 147 10/18/2022   TRIG 164.0 (H) 10/18/2022   HDL 56.80 10/18/2022   LDLDIRECT 170.0 04/07/2022  LDLCALC 57 10/18/2022   ALT 12 10/18/2022   AST 14 10/18/2022   NA 138 10/18/2022   K 3.8 10/18/2022   CL 102 10/18/2022   CREATININE 0.73 10/18/2022   BUN 10 10/18/2022   CO2 29 10/18/2022   TSH 0.70 10/18/2022   INR 1.08 07/23/2009   HGBA1C 8.1 12/17/2022   MICROALBUR 1.5 10/18/2022   Assessment/Plan:  Rachel [name] is a 57 y.o. Other or two or more races [6] female with  has a past medical history of ALLERGIC RHINITIS (10/04/2007), Anemia (01/21/2011), Anxiety (11/15/2018), ASTHMA (08/02/2007), Asthma (08/02/2007), Blood transfusion without reported diagnosis, Cervical radiculopathy (01/21/2011), Cervicogenic headache (04/27/2017), COMMON MIGRAINE (05/15/2009), Depression (01/09/2010), DIABETES MELLITUS, TYPE II (08/02/2007), Dysphagia, pharyngoesophageal phase (06/12/2014), GERD (08/02/2007), HYPERLIPIDEMIA (08/02/2007), Hyperlipidemia (08/02/2007), INSOMNIA-SLEEP DISORDER-UNSPEC (01/04/2008), INTERMITTENT VERTIGO (05/15/2009), LIBIDO, DECREASED (01/09/2010),  Migraine without aura (05/15/2009), Nonallopathic lesion of rib cage (12/21/2017), Nonallopathic lesion of sacral region (07/29/2015), Nonallopathic lesion of thoracic region (07/29/2015), Osteoporosis (04/25/2019), Patellofemoral syndrome of both knees (04/25/2019), Rheumatoid factor positive (02/10/2016), Right knee pain (01/31/2019), and Type 2 diabetes mellitus with hyperglycemia, with long-term current use of insulin (HCC) (08/02/2007).  Type 2 diabetes mellitus with hyperglycemia, with long-term current use of insulin (HCC) Lab Results  Component Value Date   HGBA1C 8.1 12/17/2022   Uncontrolled, pt to continue current medical treatment toujeo 30 every day, oempic 0.25qwk, declines other change, for endo f/u later this month   Hyperlipidemia Lab Results  Component Value Date   LDLCALC 57 10/18/2022   Stable, pt to continue current statin crestor 20 qd   Asthma Stable, cont currrent med tx  Osteoporosis Worst t score is -2.6, Mild but will need start tx prolia if pt ok with cost  Recurrent sinusitis Mild to mod, for antibx course augmentin bid, for CT sinus,  to f/u any worsening symptoms or concerns  Followup: Return in about 6 months (around 10/17/2023).  Oliver Barre, MD 04/20/2023 7:24 AM [facility]

## 2023-04-20 ENCOUNTER — Encounter: Payer: Self-pay | Admitting: Internal Medicine

## 2023-04-20 DIAGNOSIS — J329 Chronic sinusitis, unspecified: Secondary | ICD-10-CM | POA: Insufficient documentation

## 2023-04-20 NOTE — Assessment & Plan Note (Signed)
Worst t score is -2.6, Mild but will need start tx prolia if pt ok with cost

## 2023-04-20 NOTE — Assessment & Plan Note (Signed)
Lab Results  Component Value Date   LDLCALC 57 10/18/2022   Stable, pt to continue current statin crestor 20 qd

## 2023-04-20 NOTE — Assessment & Plan Note (Signed)
Mild to mod, for antibx course augmentin bid, for CT sinus,  to f/u any worsening symptoms or concerns

## 2023-04-20 NOTE — Assessment & Plan Note (Addendum)
Lab Results  Component Value Date   HGBA1C 8.1 12/17/2022   Uncontrolled, pt to continue current medical treatment toujeo 30 every day, oempic 0.25qwk, declines other change, for endo f/u later this month

## 2023-04-20 NOTE — Assessment & Plan Note (Signed)
Stable, cont currrent med tx

## 2023-04-25 ENCOUNTER — Other Ambulatory Visit (HOSPITAL_COMMUNITY): Payer: Self-pay

## 2023-04-25 ENCOUNTER — Ambulatory Visit (INDEPENDENT_AMBULATORY_CARE_PROVIDER_SITE_OTHER): Payer: Medicare HMO | Admitting: Internal Medicine

## 2023-04-25 ENCOUNTER — Encounter: Payer: Self-pay | Admitting: Internal Medicine

## 2023-04-25 VITALS — BP 122/78 | HR 94 | Ht 60.0 in | Wt 148.0 lb

## 2023-04-25 DIAGNOSIS — Z794 Long term (current) use of insulin: Secondary | ICD-10-CM | POA: Diagnosis not present

## 2023-04-25 DIAGNOSIS — E663 Overweight: Secondary | ICD-10-CM | POA: Diagnosis not present

## 2023-04-25 DIAGNOSIS — E1165 Type 2 diabetes mellitus with hyperglycemia: Secondary | ICD-10-CM | POA: Diagnosis not present

## 2023-04-25 DIAGNOSIS — E782 Mixed hyperlipidemia: Secondary | ICD-10-CM | POA: Diagnosis not present

## 2023-04-25 DIAGNOSIS — G63 Polyneuropathy in diseases classified elsewhere: Secondary | ICD-10-CM | POA: Diagnosis not present

## 2023-04-25 LAB — POCT GLYCOSYLATED HEMOGLOBIN (HGB A1C): Hemoglobin A1C: 8.4 % — AB (ref 4.0–5.6)

## 2023-04-25 MED ORDER — GLIPIZIDE ER 2.5 MG PO TB24
2.5000 mg | ORAL_TABLET | Freq: Every day | ORAL | 3 refills | Status: DC
  Filled 2023-04-25: qty 90, 90d supply, fill #0
  Filled 2023-09-08: qty 90, 90d supply, fill #1
  Filled 2023-11-29: qty 90, 90d supply, fill #2

## 2023-04-25 NOTE — Patient Instructions (Addendum)
Please use: - Toujeo 28 units in a.m.  Try to increase: - Ozempic 0.5 mg weekly in a.m.  Please start: - Glipizide ER 2.5 mg before b'fast  Please return in 3-4 months with your sugar log.

## 2023-04-25 NOTE — Progress Notes (Signed)
Subjective:     Patient ID: Rachel [name], female   DOB: June 19, 1965, 57 y.o.   MRN: [id number]  HPI Ms. Rachel [name] is a  57 y.o. woman, returning for f/u for DM2, dx 1987, uncontrolled, insulin-dependent, with probable complications (? peripheral neuropathy, ?  Gastroparesis).  Last visit 4 months ago.  Interim history: No increased urination, blurry vision, chest pain.  She had a cervical spine steroid injection in 01/2023. She also had an eye infection she also had an eye infection recently and was on antibiotics and steroids. She had tonsillar surgery 02/28/2023 >> was nauseated afterwards >> was off Ozempic >> restarted since, but developed occasional diarrhea, AP >> took Pepto bismol. She recently had a 101 fever few days ago >> no source of infection found >> on Augmentin now.  Reviewed HbA1c levels: Lab Results  Component Value Date   HGBA1C 8.1 12/17/2022   HGBA1C 8.3 (H) 10/18/2022   HGBA1C 8.4 (A) 08/13/2022   HGBA1C 7.7 (H) 04/07/2022   HGBA1C 7.2 (A) 03/09/2022   HGBA1C 7.2 (A) 11/06/2021   HGBA1C 6.8 (A) 08/04/2021   HGBA1C 6.7 (A) 03/30/2021   HGBA1C 8.3 (H) 12/30/2020   HGBA1C 6.6 (A) 09/16/2020   HGBA1C 7.8 (A) 06/17/2020   HGBA1C 10.0 (H) 08/29/2019   HGBA1C 11.1 (A) 07/02/2019   HGBA1C 10.2 (H) 11/14/2018   HGBA1C 11.2 (H) 08/23/2018   HGBA1C 8.6 (A) 04/26/2018   HGBA1C 0 04/26/2018   HGBA1C 0 (A) 04/26/2018   HGBA1C 0.0 04/26/2018   HGBA1C 12.7 (A) 11/23/2017   HGBA1C 9.2 02/17/2017   HGBA1C 9.2 11/11/2016   HGBA1C 11.5 (H) 06/25/2016   HGBA1C 10.9 04/29/2015   HGBA1C 10.8 (H) 01/09/2015   HGBA1C 12.7 (H) 06/24/2014   HGBA1C 15.1 (H) 10/04/2013   HGBA1C 8.4 (H) 01/11/2013   HGBA1C 8.6 (H) 10/02/2012   HGBA1C 10.1 (H) 06/13/2012  02/17/2017: HbA1c calculated from fructosamine is better, at 8.34%, but still high. Prev. 10.1%.  Reviewed history: She came off all DM medicines for 6 mo before the HbA1c in 09/2013. She again ran out of all meds in  04/2016.  She is  on: - Lantus 40 >> ... Semglee 24 >> 30 >> 24-28 >> Toujeo 24-28 units in a.m. - Ozempic 0.5 mg weekly - added 06/2019 >> 0.25 >> 0.5 >> 0.25 mg weekly (diarrhea, AP >> improved) Stopped Januvia 100 mg in am >> then Ozempic 0.5 mg weekly She is off Metformin XR 1000 mg 2x a day with b'fast and dinner >> upset stomach >> stopped 04/2019 We tried Trulicity 1.5 mg weekly >> worked great but had to stop b/c GERD >> stopped. We tried Jardiance 25 mg daily >> started 08/2016 >> but stopped recently 2/2 recurrent UTIs She was on Glipizide 5 mg bid - added 04/2015 >> was not taking it b/c low CBGs. We stopped Glipizide XL 5 mg in 07/2014. She had episodes of hypoglycemia with Amaryl in the past. She was on Bydureon 2 mg weekly (had nausea). Previously on Humalog, then NovoLog -started 12/2020  She checks her sugars 2 times a day: - am: on Ozempic: 80, 124-138 >> 87-140, 205 >> 60, 109-120, 189 - after b'fast: 158-251 >> n/c  - before lunch: 75 (fasting, w/o insulin) >> 99 >> n/c  - after lunch: up to 200s >> n/c >> 215 - before dinner:  180-200 >> 137, 150 >> 140-150  >> n/c - after dinner: 166-245 >> n/c - bedtime:  145-150s >> n/c >> 200 >>  103-125, 140 >> n/c She has hypoglycemia awareness in the 90s. Lowest: 38 (while on Trulicity) >> ...  82 >> 87 >> 60 Highest 600 >> ...  200 (candy) >> 205 >> 215  She saw nutrition in the past.  -+ HL. Last lipids: Lab Results  Component Value Date   CHOL 147 10/18/2022   HDL 56.80 10/18/2022   LDLCALC 57 10/18/2022   LDLDIRECT 170.0 04/07/2022   TRIG 164.0 (H) 10/18/2022   CHOLHDL 3 10/18/2022  On Crestor 40 daily.  -No CKD: Lab Results  Component Value Date   BUN 10 10/18/2022   Lab Results  Component Value Date   CREATININE 0.73 10/18/2022   Lab Results  Component Value Date   MICRALBCREAT 0.8 10/18/2022   MICRALBCREAT 1.0 04/07/2022   MICRALBCREAT 1.2 10/12/2021   MICRALBCREAT 0.8 08/28/2020   MICRALBCREAT  2.0 08/29/2019   MICRALBCREAT 1.4 06/25/2016   MICRALBCREAT 0.6 01/09/2015   MICRALBCREAT 3.4 06/24/2014   MICRALBCREAT 4.9 10/04/2013   MICRALBCREAT 0.3 06/13/2012   -+ Numbness and tingling in the right leg.  Last foot exam 12/17/2022.  - Latest eye appointment was 03/10/2023: No DR.  She had a gastric emptying study (02/03/2021) showed gastroparesis (but checked on Ozempic!). Started on Reglan, but not using it consistently. She has constipation - on Linzess. 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 She was dx'ed with OSA >> on CPAP.  She is on disability.  Review of Systems + see HPI  I reviewed pt's medications, allergies, PMH, social hx, family hx, and changes were documented in the history of present illness. Otherwise, unchanged from my initial visit note.  Past Medical History:  Diagnosis Date   ALLERGIC RHINITIS 10/04/2007   Anemia 01/21/2011   Anxiety 11/15/2018   ASTHMA 08/02/2007   INHALERS ONLY IN [place]   Asthma 08/02/2007   Qualifier: Diagnosis of  By: Nena Jordan    Blood transfusion without reported diagnosis    with first child    Cervical radiculopathy 01/21/2011   Cervicogenic headache 04/27/2017   COMMON MIGRAINE 05/15/2009   Depression 01/09/2010   Qualifier: Diagnosis of  By: Jonny Ruiz MD, Len Blalock    DIABETES MELLITUS, TYPE II 08/02/2007   Dysphagia, pharyngoesophageal phase 06/12/2014   GERD 08/02/2007   HYPERLIPIDEMIA 08/02/2007   Hyperlipidemia 08/02/2007   Qualifier: Diagnosis of  By: Nena Jordan    INSOMNIA-SLEEP DISORDER-UNSPEC 01/04/2008   INTERMITTENT VERTIGO 05/15/2009   LIBIDO, DECREASED 01/09/2010   Migraine without aura 05/15/2009   Qualifier: Diagnosis of  By: Jonny Ruiz MD, Len Blalock    Nonallopathic lesion of rib cage 12/21/2017   Nonallopathic lesion of sacral region 07/29/2015   Nonallopathic lesion of thoracic region 07/29/2015   Osteoporosis 04/25/2019   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   Rheumatoid factor positive 02/10/2016    Right knee pain 01/31/2019   Injected January 31, 2019   Type 2 diabetes mellitus with hyperglycemia, with long-term current use of insulin (HCC) 08/02/2007   Qualifier: Diagnosis of  By: Nena Jordan    Past Surgical History:  Procedure Laterality Date   CESAREAN SECTION     x 3   CYST EXCISION     left knee   PAROTIDECTOMY  10/21/2011   Procedure: PAROTIDECTOMY;  Surgeon: Christia Reading, MD;  Location: [facility] OR;  Service: ENT;  Laterality: Left;   Social History   Socioeconomic History   Marital status: Married    Spouse name:  Juan   Number of children: 3   Years of education: Degree   Highest education level: Associate degree: occupational, Scientist, product/process development, or vocational program  Occupational History   Occupation: Curator: [employer]   Occupation: Psychologist, sport and exercise    Employer: [employer]  Tobacco Use   Smoking status: Never   Smokeless tobacco: Never  Vaping Use   Vaping status: Never Used  Substance and Sexual Activity   Alcohol use: No    Alcohol/week: 0.0 standard drinks of alcohol   Drug use: No   Sexual activity: Not on file  Other Topics Concern   Not on file  Social History Narrative   She has 3 daughters.   Patient has a 4 year degree.    Patient working at [employer].    Patient is married to Powell.   Social Determinants of Health   Financial Resource Strain: Low Risk  (04/19/2023)   Overall Financial Resource Strain (CARDIA)    Difficulty of Paying Living Expenses: Not very hard  Food Insecurity: No Food Insecurity (04/19/2023)   Hunger Vital Sign    Worried About Running Out of Food in the Last Year: Never true    Ran Out of Food in the Last Year: Never true  Transportation Needs: Unmet Transportation Needs (04/19/2023)   PRAPARE - Administrator, Civil Service (Medical): Yes    Lack of Transportation (Non-Medical): Yes  Physical Activity: Unknown (10/18/2022)   Exercise Vital Sign    Days of Exercise per Week: 0 days     Minutes of Exercise per Session: Not on file  Stress: Stress Concern Present (10/18/2022)   [employer] of Occupational Health - Occupational Stress Questionnaire    Feeling of Stress : Very much  Social Connections: Socially Integrated (04/19/2023)   Social Connection and Isolation Panel [NHANES]    Frequency of Communication with Friends and Family: More than three times a week    Frequency of Social Gatherings with Friends and Family: Twice a week    Attends Religious Services: More than 4 times per year    Active Member of [employer] or Organizations: Yes    Attends Banker Meetings: 1 to 4 times per year    Marital Status: Married  Catering manager Violence: Not on file   Current Outpatient Medications on File Prior to Visit  Medication Sig Dispense Refill   Accu-Chek FastClix Lancets MISC Check blood glucose twice daily 204 each 3   amoxicillin-clavulanate (AUGMENTIN) 875-125 MG tablet Take 1 tablet by mouth 2 (two) times daily. 20 tablet 0   aspirin 81 MG EC tablet Take 81 mg by mouth daily.     Blood Glucose Monitoring Suppl (ACCU-CHEK GUIDE) w/Device KIT Use as instructed to check blood sugar 2 times daily 1 kit 0   botulinum toxin Type A (BOTOX) 200 units injection Inject 155 units IM Into multiple sites in the face,neck,and head every 90 days 1 each 4   butalbital-acetaminophen-caffeine (FIORICET) 50-325-40 MG tablet Take 1 tablet by mouth every 6 (six) hours as needed for headache. Max 2 tablets per day 30 tablet 3   carbamazepine (TEGRETOL) 200 MG tablet TAKE 1 TABLET TWICE DAILY FOR ONE WEEK, THEN INCREASE TO 1 TABLET THREE TIMES DAILY. 90 tablet 3   Cholecalciferol (VITAMIN D) 50 MCG (2000 UT) CAPS Take 2 capsules (4,000 Units total) by mouth daily. 90 capsule 3   Continuous Glucose Sensor (DEXCOM G7 SENSOR) MISC Apply 1  sensor every 10 days 9 each 4   Diclofenac Sodium (PENNSAID) 2 % SOLN Place 2 g onto the skin 2 (two) times daily. 112 g 3   DULoxetine (CYMBALTA)  60 MG capsule Take 1 capsule (60 mg total) by mouth daily. 90 capsule 3   esomeprazole (NEXIUM) 40 MG capsule Take 1 capsule (40 mg total) by mouth 2 (two) times daily before a meal. 180 capsule 3   fluorometholone (FML) 0.1 % ophthalmic suspension Place 1 drop into both eyes 4 (four) times daily. 5 mL 0   Fluticasone-Umeclidin-Vilant (TRELEGY ELLIPTA) 100-62.5-25 MCG/ACT AEPB Inhale 1 puff into the lungs daily. 60 each 11   gabapentin (NEURONTIN) 300 MG capsule Take 2 capsules (600 mg total) by mouth 2 (two) times daily. 120 capsule 3   glucose blood (ACCU-CHEK GUIDE) test strip Use to check blood sugar 2 (two) times daily as directed 200 each 3   HYDROcodone-acetaminophen (HYCET) 7.5-325 mg/15 ml solution Take 15-20 mL (7.5-10 mg of hydrocodone total) by mouth every 4 (four) hours as needed for severe pain (7-10). 473 mL 0   insulin glargine, 1 Unit Dial, (TOUJEO SOLOSTAR) 300 UNIT/ML Solostar Pen Inject 30 Units into the skin daily. 30 mL 2   linaclotide (LINZESS) 145 MCG CAPS capsule Take 1 capsule (145 mcg total) by mouth daily before breakfast 30 capsule 2   metoCLOPramide (REGLAN) 5 MG tablet Take 1 tablet (5 mg total) by mouth 3 (three) times daily 30 minutes before meals 90 tablet 1   Multiple Vitamin (MULTIVITAMIN WITH MINERALS) TABS tablet Take 1 tablet by mouth daily.     neomycin-polymyxin-hydrocortisone (CORTISPORIN) OTIC solution Place 4 drops into the left ear 4 (four) times daily. 10 mL 0   nystatin-triamcinolone ointment (MYCOLOG) APPLY TO THE AFFECTED AREA(S) TWICE DAILY 30 g 11   ondansetron (ZOFRAN ODT) 8 MG disintegrating tablet Take 1 tablet (8 mg total) by mouth every 8 (eight) hours as needed for nausea or vomiting. 30 tablet 0   ondansetron (ZOFRAN) 4 MG tablet Take 1 tablet (4 mg total) by mouth every 8 (eight) hours as needed for nausea & vomiting 40 tablet 1   prednisoLONE acetate (PRED FORTE) 1 % ophthalmic suspension Place 1 drop into both eyes 4 (four) times daily. 5 mL  0   predniSONE (DELTASONE) 10 MG tablet take 3 tabs by mouth a day for 3 days, then 2 tabs a day for 3 days, then 1 tab per day for 3 days 18 tablet 0   propranolol (INDERAL) 20 MG tablet Take 1.5 tablets (30 mg total) by mouth at bedtime. 90 tablet 1   rosuvastatin (CRESTOR) 20 MG tablet Take 1 tablet (20 mg total) by mouth daily. 90 tablet 3   Semaglutide,0.25 or 0.5MG /DOS, (OZEMPIC, 0.25 OR 0.5 MG/DOSE,) 2 MG/3ML SOPN Inject 0.5 mg into the skin once a week. 9 mL 3   tiZANidine (ZANAFLEX) 4 MG tablet Take 1 tablet (4 mg total) by mouth every 8 (eight) hours as needed for muscle spasms. 90 tablet 5   tiZANidine (ZANAFLEX) 4 MG tablet Take 1 tablet (4 mg total) by mouth at bedtime. 90 tablet 0   tiZANidine (ZANAFLEX) 4 MG tablet TAKE 1 TABLET AT BEDTIME 90 tablet 3   triamcinolone (NASACORT) 55 MCG/ACT AERO nasal inhaler Place 2 sprays into the nose daily. 1 Inhaler 12   Ubrogepant (UBRELVY) 100 MG TABS Take 1 tablet (100 mg total) by mouth as needed. May repeat after 2 hours.  Maximum 2 tablets in  24 hours. 10 tablet 11   No current facility-administered medications on file prior to visit.   Allergies  Allergen Reactions   Lipitor [Atorvastatin]     REACTION: myalgias   Nitroglycerin    Family History  Problem Relation Age of Onset   Hypertension Father    Diabetes Father    Asthma Father    Cervical cancer Maternal Aunt    Heart disease Maternal Aunt    Anesthesia problems Neg Hx    Colon cancer Neg Hx    Breast cancer Neg Hx    Esophageal cancer Neg Hx    Stomach cancer Neg Hx     Objective:   Physical Exam BP 122/78   Pulse 94   Ht 5' (1.524 m)   Wt 148 lb (67.1 kg)   SpO2 97%   BMI 28.90 kg/m   Wt Readings from Last 10 Encounters:  04/25/23 148 lb (67.1 kg)  04/19/23 145 lb (65.8 kg)  03/24/23 148 lb (67.1 kg)  02/02/23 144 lb (65.3 kg)  12/17/22 146 lb (66.2 kg)  12/08/22 145 lb (65.8 kg)  12/01/22 143 lb (64.9 kg)  10/20/22 141 lb (64 kg)  10/18/22 144 lb  (65.3 kg)  09/20/22 143 lb (64.9 kg)   Constitutional: normal weight, in NAD Eyes:  EOMI, no exophthalmos ENT: no neck masses, no cervical lymphadenopathy Cardiovascular: Tachycardia, RR, No MRG Respiratory: CTA B Musculoskeletal: no deformities Skin:no rashes Neurological: no tremor with outstretched hands  Assessment:     1. DM2, uncontrolled, insulin-dependent, with probable complications - ? peripheral neuropathy  2. HL  3.  Overweight  4. PN  Plan:     1. Patient with history of uncontrolled diabetes, basal insulin and weekly GLP-1 receptor agonist, with slightly lower HbA1c at last visit, at 8.1%.  Sugars were slightly above goal in the morning, they were at goal at that time and she was not checking in between.  The measured HbA1c was higher than expected from her blood sugars at home.  I advised her not to vary the dose of Toujeo based on the sugars in the morning but otherwise, we did not change her regimen.  She had nausea, vomiting, and bloating initially with Ozempic but these resolved.  She still has constipation for which she takes Linzess.  This has improved.She had a gastric emptying study that showed delay gastric emptying, but this was checked while she was on Ozempic.  Of note, a Dexcom CGM was not approved for her. -At today's visit, sugars are mostly at goal in the morning with few exceptions but she is not checking later in the day except for 1 value checked after lunch, which was in the 200s.  She had several health issues since last visit and was on repeated antibiotic and steroid courses.  She also had nausea and was not able to take Ozempic for period of time and then restarted at a lower dose.  She has occasional diarrhea, and I recommended probiotics, as this could be related to her antibiotic courses.  As expected, her HbA1c is higher today (see below). -We discussed about continuing Ozempic and she would like to do so.  I advised her to try to increase back  to the 0.5 mg weekly dose whenever able.  However, I also suggested to add a low-dose glipizide ER, especially the holidays coming up. - I advised her to: Patient Instructions  Please use: - Toujeo 28 units in a.m.  Try to increase: - Ozempic  0.5 mg weekly in a.m.  Please start: - Glipizide ER 2.5 mg before b'fast  Please return in 3-4 months with your sugar log.   - we checked her HbA1c: 8.4% (higher) - advised to check sugars at different times of the day - 1x a day, and advised her to rotate check times - advised for yearly eye exams >> she is UTD - return to clinic in 3-4 months  2. HL -Reviewed latest lipid panel from 10/2022: LDL much improved, triglycerides slightly elevated: Lab Results  Component Value Date   CHOL 147 10/18/2022   HDL 56.80 10/18/2022   LDLCALC 57 10/18/2022   LDLDIRECT 170.0 04/07/2022   TRIG 164.0 (H) 10/18/2022   CHOLHDL 3 10/18/2022  -On Crestor 40 mg daily-no side effects  3.  Overweight -She is on Ozempic which should also help with weight loss -She gained 3 pounds before last visit and lost 1 more pound since then  4. PN -B12 level was normal at last check: Lab Results  Component Value Date   VITAMINB12 356 10/18/2022  -I recommended alpha lipoic acid 600 mg twice a day previously, but she did not start  Carlus Pavlov, MD PhD [facility]

## 2023-04-26 ENCOUNTER — Ambulatory Visit: Payer: Medicare HMO | Admitting: Neurology

## 2023-04-29 ENCOUNTER — Ambulatory Visit: Payer: Medicare HMO | Admitting: Neurology

## 2023-05-04 NOTE — Progress Notes (Deleted)
[name] [address] [place] [address] Phone: [phone] Subjective:    I'm seeing this patient by the request  of:  Corwin Levins, MD  CC:   [id number]  Rachel [name] is a 57 y.o. female coming in with complaint of back and neck pain. OMT 03/24/2023. Patient states   Medications patient has been prescribed: None  Taking:         Reviewed prior external information including notes and imaging from previsou exam, outside providers and external EMR if available.   As well as notes that were available from care everywhere and other healthcare systems.  Past medical history, social, surgical and family history all reviewed in electronic medical record.  No pertanent information unless stated regarding to the chief complaint.   Past Medical History:  Diagnosis Date   ALLERGIC RHINITIS 10/04/2007   Anemia 01/21/2011   Anxiety 11/15/2018   ASTHMA 08/02/2007   INHALERS ONLY IN [place]   Asthma 08/02/2007   Qualifier: Diagnosis of  By: Nena Jordan    Blood transfusion without reported diagnosis    with first child    Cervical radiculopathy 01/21/2011   Cervicogenic headache 04/27/2017   COMMON MIGRAINE 05/15/2009   Depression 01/09/2010   Qualifier: Diagnosis of  By: Jonny Ruiz MD, Len Blalock    DIABETES MELLITUS, TYPE II 08/02/2007   Dysphagia, pharyngoesophageal phase 06/12/2014   GERD 08/02/2007   HYPERLIPIDEMIA 08/02/2007   Hyperlipidemia 08/02/2007   Qualifier: Diagnosis of  By: Nena Jordan    INSOMNIA-SLEEP DISORDER-UNSPEC 01/04/2008   INTERMITTENT VERTIGO 05/15/2009   LIBIDO, DECREASED 01/09/2010   Migraine without aura 05/15/2009   Qualifier: Diagnosis of  By: Jonny Ruiz MD, Len Blalock    Nonallopathic lesion of rib cage 12/21/2017   Nonallopathic lesion of sacral region 07/29/2015   Nonallopathic lesion of thoracic region 07/29/2015   Osteoporosis 04/25/2019   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   Rheumatoid factor  positive 02/10/2016   Right knee pain 01/31/2019   Injected January 31, 2019   Type 2 diabetes mellitus with hyperglycemia, with long-term current use of insulin (HCC) 08/02/2007   Qualifier: Diagnosis of  By: Nena Jordan     Allergies  Allergen Reactions   Lipitor [Atorvastatin]     REACTION: myalgias   Nitroglycerin      Review of Systems:  No headache, visual changes, nausea, vomiting, diarrhea, constipation, dizziness, abdominal pain, skin rash, fevers, chills, night sweats, weight loss, swollen lymph nodes, body aches, joint swelling, chest pain, shortness of breath, mood changes. POSITIVE muscle aches  Objective  There were no vitals taken for this visit.   General: No apparent distress alert and oriented x3 mood and affect normal, dressed appropriately.  HEENT: Pupils equal, extraocular movements intact  Respiratory: Patient's speak in full sentences and does not appear short of breath  Cardiovascular: No lower extremity edema, non tender, no erythema  Gait MSK:  Back   Osteopathic findings  C2 flexed rotated and side bent right C6 flexed rotated and side bent left T3 extended rotated and side bent right inhaled rib T9 extended rotated and side bent left L2 flexed rotated and side bent right Sacrum right on right       Assessment and Plan:  No problem-specific Assessment & Plan notes found for this encounter.    Nonallopathic problems  Decision today to treat with OMT was based on Physical Exam  After verbal consent patient was treated  with HVLA, ME, FPR techniques in cervical, rib, thoracic, lumbar, and sacral  areas  Patient tolerated the procedure well with improvement in symptoms  Patient given exercises, stretches and lifestyle modifications  See medications in patient instructions if given  Patient will follow up in 4-8 weeks             Note: This dictation was prepared with Dragon dictation along with smaller phrase technology. Any  transcriptional errors that result from this process are unintentional.

## 2023-05-05 ENCOUNTER — Ambulatory Visit: Payer: Medicare HMO | Admitting: Family Medicine

## 2023-05-10 ENCOUNTER — Ambulatory Visit: Payer: Medicare HMO | Admitting: Neurology

## 2023-05-18 ENCOUNTER — Other Ambulatory Visit: Payer: Self-pay | Admitting: Internal Medicine

## 2023-05-18 ENCOUNTER — Other Ambulatory Visit: Payer: Self-pay

## 2023-05-25 NOTE — Progress Notes (Unsigned)
[name] [address] [place] [address] Phone: [phone] Subjective:    I'm seeing this patient by the request  of:  Corwin Levins, MD  CC:   [id number]  Rachel [name] is a 57 y.o. female coming in with complaint of back and neck pain. OMT 03/24/2023. Patient states   Medications patient has been prescribed: None  Taking:         Reviewed prior external information including notes and imaging from previsou exam, outside providers and external EMR if available.   As well as notes that were available from care everywhere and other healthcare systems.  Past medical history, social, surgical and family history all reviewed in electronic medical record.  No pertanent information unless stated regarding to the chief complaint.   Past Medical History:  Diagnosis Date   ALLERGIC RHINITIS 10/04/2007   Anemia 01/21/2011   Anxiety 11/15/2018   ASTHMA 08/02/2007   INHALERS ONLY IN [place]   Asthma 08/02/2007   Qualifier: Diagnosis of  By: Nena Jordan    Blood transfusion without reported diagnosis    with first child    Cervical radiculopathy 01/21/2011   Cervicogenic headache 04/27/2017   COMMON MIGRAINE 05/15/2009   Depression 01/09/2010   Qualifier: Diagnosis of  By: Jonny Ruiz MD, Len Blalock    DIABETES MELLITUS, TYPE II 08/02/2007   Dysphagia, pharyngoesophageal phase 06/12/2014   GERD 08/02/2007   HYPERLIPIDEMIA 08/02/2007   Hyperlipidemia 08/02/2007   Qualifier: Diagnosis of  By: Nena Jordan    INSOMNIA-SLEEP DISORDER-UNSPEC 01/04/2008   INTERMITTENT VERTIGO 05/15/2009   LIBIDO, DECREASED 01/09/2010   Migraine without aura 05/15/2009   Qualifier: Diagnosis of  By: Jonny Ruiz MD, Len Blalock    Nonallopathic lesion of rib cage 12/21/2017   Nonallopathic lesion of sacral region 07/29/2015   Nonallopathic lesion of thoracic region 07/29/2015   Osteoporosis 04/25/2019   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   Rheumatoid factor  positive 02/10/2016   Right knee pain 01/31/2019   Injected January 31, 2019   Type 2 diabetes mellitus with hyperglycemia, with long-term current use of insulin (HCC) 08/02/2007   Qualifier: Diagnosis of  By: Nena Jordan     Allergies  Allergen Reactions   Lipitor [Atorvastatin]     REACTION: myalgias   Nitroglycerin      Review of Systems:  No headache, visual changes, nausea, vomiting, diarrhea, constipation, dizziness, abdominal pain, skin rash, fevers, chills, night sweats, weight loss, swollen lymph nodes, body aches, joint swelling, chest pain, shortness of breath, mood changes. POSITIVE muscle aches  Objective  There were no vitals taken for this visit.   General: No apparent distress alert and oriented x3 mood and affect normal, dressed appropriately.  HEENT: Pupils equal, extraocular movements intact  Respiratory: Patient's speak in full sentences and does not appear short of breath  Cardiovascular: No lower extremity edema, non tender, no erythema  Gait MSK:  Back   Osteopathic findings  C2 flexed rotated and side bent right C6 flexed rotated and side bent left T3 extended rotated and side bent right inhaled rib T9 extended rotated and side bent left L2 flexed rotated and side bent right Sacrum right on right       Assessment and Plan:  No problem-specific Assessment & Plan notes found for this encounter.    Nonallopathic problems  Decision today to treat with OMT was based on Physical Exam  After verbal consent patient was treated  with HVLA, ME, FPR techniques in cervical, rib, thoracic, lumbar, and sacral  areas  Patient tolerated the procedure well with improvement in symptoms  Patient given exercises, stretches and lifestyle modifications  See medications in patient instructions if given  Patient will follow up in 4-8 weeks             Note: This dictation was prepared with Dragon dictation along with smaller phrase technology. Any  transcriptional errors that result from this process are unintentional.

## 2023-05-26 ENCOUNTER — Ambulatory Visit: Payer: Medicare HMO | Admitting: Family Medicine

## 2023-05-30 ENCOUNTER — Other Ambulatory Visit (HOSPITAL_COMMUNITY): Payer: Self-pay

## 2023-05-30 ENCOUNTER — Other Ambulatory Visit: Payer: Self-pay

## 2023-05-30 DIAGNOSIS — E119 Type 2 diabetes mellitus without complications: Secondary | ICD-10-CM | POA: Diagnosis not present

## 2023-05-30 DIAGNOSIS — H04123 Dry eye syndrome of bilateral lacrimal glands: Secondary | ICD-10-CM | POA: Diagnosis not present

## 2023-05-30 DIAGNOSIS — Z794 Long term (current) use of insulin: Secondary | ICD-10-CM | POA: Diagnosis not present

## 2023-05-30 DIAGNOSIS — H2513 Age-related nuclear cataract, bilateral: Secondary | ICD-10-CM | POA: Diagnosis not present

## 2023-05-30 DIAGNOSIS — H1045 Other chronic allergic conjunctivitis: Secondary | ICD-10-CM | POA: Diagnosis not present

## 2023-05-30 MED ORDER — CYCLOSPORINE 0.05 % OP EMUL
1.0000 [drp] | Freq: Two times a day (BID) | OPHTHALMIC | 3 refills | Status: AC
  Filled 2023-05-30: qty 60, 30d supply, fill #0
  Filled 2023-06-27: qty 60, 30d supply, fill #1
  Filled 2023-08-01: qty 60, 30d supply, fill #2
  Filled 2023-08-01: qty 180, 90d supply, fill #2
  Filled 2023-09-08 – 2024-02-15 (×4): qty 180, 90d supply, fill #3
  Filled 2024-05-11: qty 180, 90d supply, fill #4

## 2023-05-31 NOTE — Progress Notes (Unsigned)
[name] [address] [place] [address] Phone: [phone] Subjective:   Rachel [name], am serving as a scribe for Dr. Antoine Primas.  I'm seeing this patient by the request  of:  Corwin Levins, MD  CC: Back and neck pain follow-up  [id number]  [name] is a 57 y.o. female coming in with complaint of back and neck pain. OMT 03/24/2023. Patient states that her pain has increased due to missing an appointment.  Patient has had some more tightness recently.  Nothing that is stopping her from activity at the moment.  Still severe enough that it can be very uncomfortable at night.  Medications patient has been prescribed: Zanaflex  Taking: Yes         Reviewed prior external information including notes and imaging from previsou exam, outside providers and external EMR if available.   As well as notes that were available from care everywhere and other healthcare systems.  Past medical history, social, surgical and family history all reviewed in electronic medical record.  No pertanent information unless stated regarding to the chief complaint.   Past Medical History:  Diagnosis Date   ALLERGIC RHINITIS 10/04/2007   Anemia 01/21/2011   Anxiety 11/15/2018   ASTHMA 08/02/2007   INHALERS ONLY IN [place]   Asthma 08/02/2007   Qualifier: Diagnosis of  By: Nena Jordan    Blood transfusion without reported diagnosis    with first child    Cervical radiculopathy 01/21/2011   Cervicogenic headache 04/27/2017   COMMON MIGRAINE 05/15/2009   Depression 01/09/2010   Qualifier: Diagnosis of  By: Jonny Ruiz MD, Len Blalock    DIABETES MELLITUS, TYPE II 08/02/2007   Dysphagia, pharyngoesophageal phase 06/12/2014   GERD 08/02/2007   HYPERLIPIDEMIA 08/02/2007   Hyperlipidemia 08/02/2007   Qualifier: Diagnosis of  By: Nena Jordan    INSOMNIA-SLEEP DISORDER-UNSPEC 01/04/2008   INTERMITTENT VERTIGO 05/15/2009   LIBIDO, DECREASED 01/09/2010   Migraine  without aura 05/15/2009   Qualifier: Diagnosis of  By: Jonny Ruiz MD, Len Blalock    Nonallopathic lesion of rib cage 12/21/2017   Nonallopathic lesion of sacral region 07/29/2015   Nonallopathic lesion of thoracic region 07/29/2015   Osteoporosis 04/25/2019   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   Rheumatoid factor positive 02/10/2016   Right knee pain 01/31/2019   Injected January 31, 2019   Type 2 diabetes mellitus with hyperglycemia, with long-term current use of insulin (HCC) 08/02/2007   Qualifier: Diagnosis of  By: Nena Jordan     Allergies  Allergen Reactions   Lipitor [Atorvastatin]     REACTION: myalgias   Nitroglycerin      Review of Systems:  No  visual changes, nausea, vomiting, diarrhea, constipation, dizziness, abdominal pain, skin rash, fevers, chills, night sweats, weight loss, swollen lymph nodes, body aches, joint swelling, chest pain, shortness of breath, mood changes. POSITIVE muscle aches, headache  Objective  Blood pressure 100/72, pulse 80, height 5' (1.524 m), weight 146 lb (66.2 kg).   General: No apparent distress alert and oriented x3 mood and affect normal, dressed appropriately.  HEENT: Pupils equal, extraocular movements intact  Respiratory: Patient's speak in full sentences and does not appear short of breath  Cardiovascular: No lower extremity edema, non tender, no erythema  Gait MSK:  Back does have some loss lordosis noted.  Tightness noted in the paraspinal musculature.  Does have some limitation neck sidebending.  No radicular symptoms with Spurling's  today though.  Osteopathic findings  C2 flexed rotated and side bent right C6 flexed rotated and side bent left T3 extended rotated and side bent right inhaled rib T7 extended rotated and side bent left L2 flexed rotated and side bent right Sacrum right on right       Assessment and Plan:  Cervicogenic headache Continues to have some cervicogenic headaches, discussed icing regimen  and home exercises, discussed avoiding certain activities.  Increase activity slowly otherwise.  Follow-up again in 6 to 8 weeks no changes in medication.  Total time reviewing patient's imaging and discussing with her 31 minutes.    Nonallopathic problems  Decision today to treat with OMT was based on Physical Exam  After verbal consent patient was treated with HVLA, ME, FPR techniques in cervical, rib, thoracic, lumbar, and sacral  areas  Patient tolerated the procedure well with improvement in symptoms  Patient given exercises, stretches and lifestyle modifications  See medications in patient instructions if given  Patient will follow up in 4-8 weeks     The above documentation has been reviewed and is accurate and complete Judi Saa, DO         Note: This dictation was prepared with Dragon dictation along with smaller phrase technology. Any transcriptional errors that result from this process are unintentional.

## 2023-06-01 ENCOUNTER — Ambulatory Visit: Payer: Medicare HMO | Admitting: Family Medicine

## 2023-06-01 ENCOUNTER — Encounter: Payer: Self-pay | Admitting: Family Medicine

## 2023-06-01 VITALS — BP 100/72 | HR 80 | Ht 60.0 in | Wt 146.0 lb

## 2023-06-01 DIAGNOSIS — M9901 Segmental and somatic dysfunction of cervical region: Secondary | ICD-10-CM | POA: Diagnosis not present

## 2023-06-01 DIAGNOSIS — M9908 Segmental and somatic dysfunction of rib cage: Secondary | ICD-10-CM | POA: Diagnosis not present

## 2023-06-01 DIAGNOSIS — M9903 Segmental and somatic dysfunction of lumbar region: Secondary | ICD-10-CM

## 2023-06-01 DIAGNOSIS — G4486 Cervicogenic headache: Secondary | ICD-10-CM

## 2023-06-01 DIAGNOSIS — M9902 Segmental and somatic dysfunction of thoracic region: Secondary | ICD-10-CM

## 2023-06-01 DIAGNOSIS — M9904 Segmental and somatic dysfunction of sacral region: Secondary | ICD-10-CM

## 2023-06-01 NOTE — Patient Instructions (Signed)
Good to see you! See you again in 6-8 weeks 

## 2023-06-01 NOTE — Assessment & Plan Note (Addendum)
Continues to have some cervicogenic headaches, discussed icing regimen and home exercises, discussed avoiding certain activities.  Increase activity slowly otherwise.  Follow-up again in 6 to 8 weeks no changes in medication.  Total time reviewing patient's imaging and discussing with her 31 minutes.

## 2023-06-03 ENCOUNTER — Ambulatory Visit: Payer: Medicare HMO | Admitting: Neurology

## 2023-06-03 ENCOUNTER — Telehealth: Payer: Self-pay

## 2023-06-03 NOTE — Telephone Encounter (Signed)
Pharmacy Patient Advocate Encounter  Received notification from [facility] that Prior Authorization for Dexcom G7 sensor has been APPROVED from 06/15/2023 to 06/13/2024   PA #/Case ID/Reference #: [id number]

## 2023-06-06 ENCOUNTER — Ambulatory Visit: Payer: Medicare HMO | Admitting: Family Medicine

## 2023-06-09 ENCOUNTER — Ambulatory Visit (HOSPITAL_COMMUNITY): Admission: RE | Admit: 2023-06-09 | Payer: Medicare HMO | Source: Ambulatory Visit

## 2023-06-09 ENCOUNTER — Other Ambulatory Visit: Payer: Self-pay | Admitting: Neurology

## 2023-06-13 DIAGNOSIS — Z794 Long term (current) use of insulin: Secondary | ICD-10-CM | POA: Diagnosis not present

## 2023-06-13 DIAGNOSIS — H04123 Dry eye syndrome of bilateral lacrimal glands: Secondary | ICD-10-CM | POA: Diagnosis not present

## 2023-06-13 DIAGNOSIS — E119 Type 2 diabetes mellitus without complications: Secondary | ICD-10-CM | POA: Diagnosis not present

## 2023-06-13 DIAGNOSIS — H2513 Age-related nuclear cataract, bilateral: Secondary | ICD-10-CM | POA: Diagnosis not present

## 2023-06-13 DIAGNOSIS — H1045 Other chronic allergic conjunctivitis: Secondary | ICD-10-CM | POA: Diagnosis not present

## 2023-06-27 ENCOUNTER — Other Ambulatory Visit (HOSPITAL_COMMUNITY): Payer: Self-pay

## 2023-06-27 ENCOUNTER — Telehealth: Payer: Self-pay

## 2023-06-27 ENCOUNTER — Other Ambulatory Visit: Payer: Self-pay | Admitting: Internal Medicine

## 2023-06-27 DIAGNOSIS — H16223 Keratoconjunctivitis sicca, not specified as Sjogren's, bilateral: Secondary | ICD-10-CM | POA: Diagnosis not present

## 2023-06-27 MED FILL — Semaglutide Soln Pen-inj 0.25 or 0.5 MG/DOSE (2 MG/3ML): SUBCUTANEOUS | 28 days supply | Qty: 3 | Fill #0 | Status: CN

## 2023-06-27 NOTE — Telephone Encounter (Signed)
 Patient stop by the office to have the front desk to scan her new insurance card in.  Patient wants to see if she could restart Botox with her new insurance [employer].   Please advise

## 2023-06-28 ENCOUNTER — Other Ambulatory Visit (HOSPITAL_COMMUNITY): Payer: Self-pay

## 2023-06-28 ENCOUNTER — Other Ambulatory Visit: Payer: Self-pay

## 2023-06-28 NOTE — Telephone Encounter (Signed)
 Per Dr.jaffe, We can try.  Her insurance may want an updated routine office visit note.     LMOVM to see if patient would be able to be schedule to have new notes for her new insurance to cover Botox.

## 2023-07-05 NOTE — Progress Notes (Unsigned)
NEUROLOGY FOLLOW UP OFFICE NOTE  Rachel [name] [id number]  Assessment/Plan:   Chronic migraine without Aura, Without Status Migrainosus, Not Intractable - tried multiple preventatives such as topiramate, Depakote, nortriptyline, venlafaxine, atenolol, Aimovig, Emgality - meets criteria for Botox.  Prior to Botox, she averaged 15 headache days a month and saw significant improvement just after the first round.   Cervicogenic component to her migraines.  Has radiculopathy and spondylosis      Plan to restart Botox. Bernita Raisin for acute therapy Limit use of pain relievers to no more than 2 days out of week to prevent risk of rebound or medication-overuse headache. Keep headache diary Follow up for Botox   Subjective:  Rachel [name] is a 58 year old right-handed female with type 2 diabetes, hyperlipidemia, GERD, asthma, and migraine who follows up for migraines.   UPDATE: Restarted Botox last year.  Had one round in May but then insurance denied coverage again.  She noticed significant improvement after the first round.  Started carbamazepine 200mg  three times daily which has been ineffective.  She averages over 15 headache days a month.  Responds to [place] but headache later returns.  Does not repeat dose.  She would like to try to restart Botox again with new insurance.   Current NSAIDS: Fioricet, hydrocodone-acetaminophen  Current analgesics: none Current triptans: none Current ergotamine: None Current anti-emetic: promethazine, Reglan, Zofran 4mg  Current muscle relaxants: tizanidine 4mg  PRN Current anti-anxiolytic: None Current sleep aide: None Current Antihypertensive medications: propranolol 30mg  BID Current Antidepressant medications: Cymbalta 60mg  daily Current Anticonvulsant medications: Carbamazepine 200mg  three times daily, gabapentin 600 mg twice daily (for back pain) Current anti-CGRP: Bernita Raisin 100mg  Current Vitamins/Herbal/Supplements: Turmeric Current  Antihistamines/Decongestants: mecllzine, Allegra   Caffeine: One cup of coffee daily Diet: Drinks plenty of water.  No soda. Exercise: No Depression: No; Anxiety: yes.  Her mother has been ill.  She has been confused. Other pain: Knee pain. Sleep hygiene: Poor.  Worrying about her mother.   HISTORY: Onset:  2011.  She does have remote history of headaches when she was younger. Location:  Back of head and radiates to the front Quality:  Shooting up from back of head, pounding on top and front of head Initial Intensity:  8/10 Aura:  no Prodrome:  no Associated symptoms:  Nausea, photophobia, phonophobia, blurred vision (she gets annual eye exams due to diabetes) Initial Duration:  All day Initial Frequency:  Almost daily (cannot function 6 days per month) Triggers/exacerbating factors:  wine Relieving factors:  No Activity:  Cannot work about 6 days per month (she works as an Designer, fashion/clothing)   Past NSAIDS: Cambia (made headache worse), flurbiprofen, Sprix nasal spray, ibuprofen; naproxen Past analgesics: Tramadol, Excedrin, Tylenol, Fioricet Past abortive triptans: Sumatriptan 100 mg, Zembrace-SymTouch (did not like the feeling), Zomig 5 mg NS (did not like the way it made her feel), Maxalt (dizziness, ineffective), Relpax (made her feel sick) Past abortive ergotamine: Cafergot Past muscle relaxants: Robaxin, Flexeril Past anti-emetic: Zofran 8mg , Reglan, Phenergan Past antihypertensive medications: Atenolol Past antidepressant medications: Venlafaxine XR 150 mg; nortriptyline Past anticonvulsant medications: Topiramate, Depakote Past anti-CGRP: Aimovig 140mg , Emgality, Nurtec (dizziness), Ubrelvy (ineffective) Past vitamins/Herbal/Supplements: None Past antihistamines/decongestants: None Other past therapies: Epidural injections (helps for 2 weeks)   Frequent Falls: For almost 10 years, she has had falls, but they have become more frequent over the past year.   Previously, she was diagnosed with vertigo.  However, she reports that she doesn't note spinning or lightheadedness.  She says she "just falls".  On one occasion, she was pushing the seat forward while sitting and just fell over.  Otherwise, it always occurs while walking or on her feet.  She denies double vision or focal numbness or weakness.  MRI of the brain with seizure protocol was performed on 08/02/11, which was unremarkable.  Sleep deprived EEG was normal.  She has had PT, which was ineffective.  She does report that she sometimes trips while walking but doesn't fall.   Cervical plain films from 05/07/15 showed mild degenerative disc disease at C5-6, but otherwise unremarkable.  Lumbar films from 06/10/15 were personally reviewed and were negative.  She underwent anothner MRI of brain with and without contrast on 02/10/16, and was stable compared to prior MRI from 2013.  NCV-EMG from 05/13/16 was negative for neuropathy or lumbosacral radiculopathy.  MRI cervical spine on 03/31/2019 showed degenerative spondylosis but no significant spinal stenosis or cord compression. Denies diplopia or visual hallucinations.  PAST MEDICAL HISTORY: Past Medical History:  Diagnosis Date   ALLERGIC RHINITIS 10/04/2007   Anemia 01/21/2011   Anxiety 11/15/2018   ASTHMA 08/02/2007   INHALERS ONLY IN [place]   Asthma 08/02/2007   Qualifier: Diagnosis of  By: Nena Jordan    Blood transfusion without reported diagnosis    with first child    Cervical radiculopathy 01/21/2011   Cervicogenic headache 04/27/2017   COMMON MIGRAINE 05/15/2009   Depression 01/09/2010   Qualifier: Diagnosis of  By: Jonny Ruiz MD, Len Blalock    DIABETES MELLITUS, TYPE II 08/02/2007   Dysphagia, pharyngoesophageal phase 06/12/2014   GERD 08/02/2007   HYPERLIPIDEMIA 08/02/2007   Hyperlipidemia 08/02/2007   Qualifier: Diagnosis of  By: Nena Jordan    INSOMNIA-SLEEP DISORDER-UNSPEC 01/04/2008   INTERMITTENT VERTIGO 05/15/2009   LIBIDO, DECREASED  01/09/2010   Migraine without aura 05/15/2009   Qualifier: Diagnosis of  By: Jonny Ruiz MD, Len Blalock    Nonallopathic lesion of rib cage 12/21/2017   Nonallopathic lesion of sacral region 07/29/2015   Nonallopathic lesion of thoracic region 07/29/2015   Osteoporosis 04/25/2019   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   Rheumatoid factor positive 02/10/2016   Right knee pain 01/31/2019   Injected January 31, 2019   Type 2 diabetes mellitus with hyperglycemia, with long-term current use of insulin (HCC) 08/02/2007   Qualifier: Diagnosis of  By: Nena Jordan     MEDICATIONS: Current Outpatient Medications on File Prior to Visit  Medication Sig Dispense Refill   Accu-Chek FastClix Lancets MISC Check blood glucose twice daily 204 each 3   amoxicillin-clavulanate (AUGMENTIN) 875-125 MG tablet Take 1 tablet by mouth 2 (two) times daily. 20 tablet 0   aspirin 81 MG EC tablet Take 81 mg by mouth daily.     Blood Glucose Monitoring Suppl (ACCU-CHEK GUIDE) w/Device KIT Use as instructed to check blood sugar 2 times daily 1 kit 0   botulinum toxin Type A (BOTOX) 200 units injection Inject 155 units IM Into multiple sites in the face,neck,and head every 90 days 1 each 4   butalbital-acetaminophen-caffeine (FIORICET) 50-325-40 MG tablet Take 1 tablet by mouth every 6 (six) hours as needed for headache. Max 2 tablets per day 30 tablet 3   carbamazepine (TEGRETOL) 200 MG tablet TAKE 1 TABLET TWICE DAILY FOR ONE WEEK, THEN INCREASE TO 1 TABLET THREE TIMES DAILY. 90 tablet 3   Cholecalciferol (VITAMIN D3) 50 MCG (2000 UT) capsule TAKE 2 CAPSULES EVERY DAY 180 capsule 3   Continuous Glucose Sensor (  DEXCOM G7 SENSOR) MISC Apply 1 sensor every 10 days 9 each 4   cycloSPORINE (RESTASIS) 0.05 % ophthalmic emulsion Instill 1 drop into both eyes twice a day 180 each 3   Diclofenac Sodium (PENNSAID) 2 % SOLN Place 2 g onto the skin 2 (two) times daily. 112 g 3   DULoxetine (CYMBALTA) 60 MG capsule Take 1 capsule  (60 mg total) by mouth daily. 90 capsule 3   esomeprazole (NEXIUM) 40 MG capsule Take 1 capsule (40 mg total) by mouth 2 (two) times daily before a meal. 180 capsule 3   fluorometholone (FML) 0.1 % ophthalmic suspension Place 1 drop into both eyes 4 (four) times daily. 5 mL 0   Fluticasone-Umeclidin-Vilant (TRELEGY ELLIPTA) 100-62.5-25 MCG/ACT AEPB Inhale 1 puff into the lungs daily. 60 each 11   gabapentin (NEURONTIN) 300 MG capsule Take 2 capsules (600 mg total) by mouth 2 (two) times daily. 120 capsule 3   glipiZIDE (GLUCOTROL XL) 2.5 MG 24 hr tablet Take 1 tablet (2.5 mg total) by mouth daily with breakfast. 90 tablet 3   glucose blood (ACCU-CHEK GUIDE) test strip Use to check blood sugar 2 (two) times daily as directed 200 each 3   HYDROcodone-acetaminophen (HYCET) 7.5-325 mg/15 ml solution Take 15-20 mL (7.5-10 mg of hydrocodone total) by mouth every 4 (four) hours as needed for severe pain (7-10). 473 mL 0   insulin glargine, 1 Unit Dial, (TOUJEO SOLOSTAR) 300 UNIT/ML Solostar Pen Inject 30 Units into the skin daily. 30 mL 2   linaclotide (LINZESS) 145 MCG CAPS capsule Take 1 capsule (145 mcg total) by mouth daily before breakfast 30 capsule 2   metoCLOPramide (REGLAN) 5 MG tablet Take 1 tablet (5 mg total) by mouth 3 (three) times daily 30 minutes before meals 90 tablet 1   Multiple Vitamin (MULTIVITAMIN WITH MINERALS) TABS tablet Take 1 tablet by mouth daily.     neomycin-polymyxin-hydrocortisone (CORTISPORIN) OTIC solution Place 4 drops into the left ear 4 (four) times daily. 10 mL 0   nystatin-triamcinolone ointment (MYCOLOG) APPLY TO THE AFFECTED AREA(S) TWICE DAILY 30 g 11   ondansetron (ZOFRAN ODT) 8 MG disintegrating tablet Take 1 tablet (8 mg total) by mouth every 8 (eight) hours as needed for nausea or vomiting. 30 tablet 0   ondansetron (ZOFRAN) 4 MG tablet Take 1 tablet (4 mg total) by mouth every 8 (eight) hours as needed for nausea & vomiting 40 tablet 1   prednisoLONE acetate  (PRED FORTE) 1 % ophthalmic suspension Place 1 drop into both eyes 4 (four) times daily. 5 mL 0   predniSONE (DELTASONE) 10 MG tablet take 3 tabs by mouth a day for 3 days, then 2 tabs a day for 3 days, then 1 tab per day for 3 days 18 tablet 0   propranolol (INDERAL) 20 MG tablet Take 1.5 tablets (30 mg total) by mouth at bedtime. 90 tablet 1   rosuvastatin (CRESTOR) 20 MG tablet Take 1 tablet (20 mg total) by mouth daily. 90 tablet 3   Semaglutide,0.25 or 0.5MG /DOS, (OZEMPIC, 0.25 OR 0.5 MG/DOSE,) 2 MG/3ML SOPN Inject 0.5 mg into the skin once a week. 9 mL 3   tiZANidine (ZANAFLEX) 4 MG tablet Take 1 tablet (4 mg total) by mouth every 8 (eight) hours as needed for muscle spasms. 90 tablet 5   tiZANidine (ZANAFLEX) 4 MG tablet Take 1 tablet (4 mg total) by mouth at bedtime. 90 tablet 0   tiZANidine (ZANAFLEX) 4 MG tablet TAKE 1 TABLET AT  BEDTIME 90 tablet 3   triamcinolone (NASACORT) 55 MCG/ACT AERO nasal inhaler Place 2 sprays into the nose daily. 1 Inhaler 12   Ubrogepant (UBRELVY) 100 MG TABS Take 1 tablet (100 mg total) by mouth as needed. May repeat after 2 hours.  Maximum 2 tablets in 24 hours. 10 tablet 11   No current facility-administered medications on file prior to visit.    ALLERGIES: Allergies  Allergen Reactions   Lipitor [Atorvastatin]     REACTION: myalgias   Nitroglycerin     FAMILY HISTORY: Family History  Problem Relation Age of Onset   Hypertension Father    Diabetes Father    Asthma Father    Cervical cancer Maternal Aunt    Heart disease Maternal Aunt    Anesthesia problems Neg Hx    Colon cancer Neg Hx    Breast cancer Neg Hx    Esophageal cancer Neg Hx    Stomach cancer Neg Hx       Objective:  Blood pressure 114/79, pulse 96, height 5\' 1"  (1.549 m), weight 149 lb (67.6 kg), SpO2 96%. General: No acute distress.  Patient appears well-groomed.   Head:  Normocephalic/atraumatic Eyes:  Fundi examined but not visualized Neck: supple,  paraspinal  tenderness, full range of motion Heart:  Regular rate and rhythm Lungs:  Clear to auscultation bilaterally Back: No paraspinal tenderness Neurological Exam: alert and oriented.  Speech fluent and not dysarthric, language intact.  CN II-XII intact. Bulk and tone normal, muscle strength 5/5 throughout.  Sensation to light touch intact.  Deep tendon reflexes 2+ throughout, toes downgoing.  Finger to nose testing intact.  Gait normal, Romberg negative.   Rachel Millet, DO  CC: Oliver Barre, MD

## 2023-07-06 ENCOUNTER — Other Ambulatory Visit (HOSPITAL_COMMUNITY): Payer: Self-pay

## 2023-07-06 ENCOUNTER — Encounter: Payer: Self-pay | Admitting: Neurology

## 2023-07-06 ENCOUNTER — Ambulatory Visit (INDEPENDENT_AMBULATORY_CARE_PROVIDER_SITE_OTHER): Payer: 59 | Admitting: Neurology

## 2023-07-06 VITALS — BP 114/79 | HR 96 | Ht 61.0 in | Wt 149.0 lb

## 2023-07-06 DIAGNOSIS — G43709 Chronic migraine without aura, not intractable, without status migrainosus: Secondary | ICD-10-CM

## 2023-07-06 MED ORDER — UBRELVY 100 MG PO TABS
1.0000 | ORAL_TABLET | ORAL | 11 refills | Status: DC | PRN
  Filled 2023-07-06 (×2): qty 10, 30d supply, fill #0
  Filled 2023-09-08: qty 10, 30d supply, fill #1

## 2023-07-06 MED ORDER — UBRELVY 100 MG PO TABS
1.0000 | ORAL_TABLET | ORAL | 11 refills | Status: DC | PRN
  Filled 2023-07-06: qty 10, 30d supply, fill #0

## 2023-07-06 NOTE — Patient Instructions (Signed)
Start Botox Ubrelvy - may repeat after 2 hours.  Maximum 2 tablets in 24 hours.  Sent to [facility]

## 2023-07-07 ENCOUNTER — Telehealth: Payer: Self-pay

## 2023-07-07 NOTE — Telephone Encounter (Signed)
Patient seen 07/06/23, Per Dr.Jaffe patient to start Botox 200 units every 90 days.   PA team please start a PA for Botox.

## 2023-07-12 NOTE — Progress Notes (Unsigned)
[name] [address] [place] [address] Phone: [phone] Subjective:   Rachel [name] am a scribe for Dr. Katrinka Blazing.  I'm seeing this patient by the request  of:  Corwin Levins, MD  CC: Back and neck pain follow-up  [id number]  Kodee Drury is a 58 y.o. female coming in with complaint of back and neck pain. OMT 06/01/2023.  Patient states that her back and neck are still the same. She states that she needs bilateral knee injections today due to her having knee pain. Patient also states being down on her knees to clean really hurts and was a bad idea.   Medications patient has been prescribed: None      Seen neurology recently and will start Botox in the near future.    Reviewed prior external information including notes and imaging from previsou exam, outside providers and external EMR if available.   As well as notes that were available from care everywhere and other healthcare systems.  Past medical history, social, surgical and family history all reviewed in electronic medical record.  No pertanent information unless stated regarding to the chief complaint.   Past Medical History:  Diagnosis Date   ALLERGIC RHINITIS 10/04/2007   Anemia 01/21/2011   Anxiety 11/15/2018   ASTHMA 08/02/2007   INHALERS ONLY IN [place]   Asthma 08/02/2007   Qualifier: Diagnosis of  By: Nena Jordan    Blood transfusion without reported diagnosis    with first child    Cervical radiculopathy 01/21/2011   Cervicogenic headache 04/27/2017   COMMON MIGRAINE 05/15/2009   Depression 01/09/2010   Qualifier: Diagnosis of  By: Jonny Ruiz MD, Len Blalock    DIABETES MELLITUS, TYPE II 08/02/2007   Dysphagia, pharyngoesophageal phase 06/12/2014   GERD 08/02/2007   HYPERLIPIDEMIA 08/02/2007   Hyperlipidemia 08/02/2007   Qualifier: Diagnosis of  By: Nena Jordan    INSOMNIA-SLEEP DISORDER-UNSPEC 01/04/2008   INTERMITTENT VERTIGO 05/15/2009   LIBIDO, DECREASED  01/09/2010   Migraine without aura 05/15/2009   Qualifier: Diagnosis of  By: Jonny Ruiz MD, Len Blalock    Nonallopathic lesion of rib cage 12/21/2017   Nonallopathic lesion of sacral region 07/29/2015   Nonallopathic lesion of thoracic region 07/29/2015   Osteoporosis 04/25/2019   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   Rheumatoid factor positive 02/10/2016   Right knee pain 01/31/2019   Injected January 31, 2019   Type 2 diabetes mellitus with hyperglycemia, with long-term current use of insulin (HCC) 08/02/2007   Qualifier: Diagnosis of  By: Nena Jordan     Allergies  Allergen Reactions   Lipitor [Atorvastatin]     REACTION: myalgias   Nitroglycerin      Review of Systems:  No  visual changes, nausea, vomiting, diarrhea, constipation, dizziness, abdominal pain, skin rash, fevers, chills, night sweats, weight loss, swollen lymph nod chest pain, shortness of breath, mood changes. POSITIVE muscle aches, muscle aches, headache, body aches  Objective  Blood pressure 104/60, pulse 94, height 5\' 1"  (1.549 m), weight 147 lb (66.7 kg), SpO2 98%.   General: No apparent distress alert and oriented x3 mood and affect normal, dressed appropriately.  HEENT: Pupils equal, extraocular movements intact  Respiratory: Patient's speak in full sentences and does not appear short of breath  Cardiovascular: No lower extremity edema, non tender, no erythema  Gait MSK:  Back significant tightness noted in the back area today.  Patient does have some limited range of  motion noted as well. Tightness with Pearlean Brownie right greater than left. Knees do have some arthritic changes noted.  Trace effusion noted bilaterally.  After informed written and verbal consent, patient was seated on exam table. Right knee was prepped with alcohol swab and utilizing anterolateral approach, patient's right knee space was injected with 4:1  marcaine 0.5%: Kenalog 40mg /dL. Patient tolerated the procedure well without immediate  complications.  After informed written and verbal consent, patient was seated on exam table. Left knee was prepped with alcohol swab and utilizing anterolateral approach, patient's left knee space was injected with 4:1  marcaine 0.5%: Kenalog 40mg /dL. Patient tolerated the procedure well without immediate complications.  Osteopathic findings  C2 flexed rotated and side bent right C6 flexed rotated and side bent right T3 extended rotated and side bent right inhaled rib T9 extended rotated and side bent left L2 flexed rotated and side bent right L3 flexed rotated and side bent left L4 flexed rotated and side bent right L5 flexed rotated and side bent left Sacrum right on right       Assessment and Plan:  Degenerative arthritis of knee, bilateral Patient given injection today and tolerated the procedure well, discussed icing regimen and home exercises, discussed avoiding certain activities.  Increase activity slowly otherwise.  Patient still wants to avoid any type of surgical intervention.  Could be a candidate for viscosupplementation again.  Follow-up again in 6 to 8 weeks  Chronic migraine without aura without status migrainosus, not intractable Continues to have chronic migraines, Botox has been helpful, osteopathic manipulation can be helpful and after further evaluation manipulation was done again today.  Continue to work on posture and ergonomics and see if there is any other triggers.  Follow-up again in 6 to 8 weeks    Nonallopathic problems  Decision today to treat with OMT was based on Physical Exam  After verbal consent patient was treated with HVLA, ME, FPR techniques in cervical, rib, thoracic, lumbar, and sacral  areas  Patient tolerated the procedure well with improvement in symptoms  Patient given exercises, stretches and lifestyle modifications  See medications in patient instructions if given  Patient will follow up in 4-8 weeks     The above  documentation has been reviewed and is accurate and complete Judi Saa, DO         Note: This dictation was prepared with Dragon dictation along with smaller phrase technology. Any transcriptional errors that result from this process are unintentional.

## 2023-07-13 ENCOUNTER — Ambulatory Visit (INDEPENDENT_AMBULATORY_CARE_PROVIDER_SITE_OTHER): Payer: 59 | Admitting: Family Medicine

## 2023-07-13 VITALS — BP 104/60 | HR 94 | Ht 61.0 in | Wt 147.0 lb

## 2023-07-13 DIAGNOSIS — M9901 Segmental and somatic dysfunction of cervical region: Secondary | ICD-10-CM | POA: Diagnosis not present

## 2023-07-13 DIAGNOSIS — M170 Bilateral primary osteoarthritis of knee: Secondary | ICD-10-CM | POA: Diagnosis not present

## 2023-07-13 DIAGNOSIS — M9903 Segmental and somatic dysfunction of lumbar region: Secondary | ICD-10-CM

## 2023-07-13 DIAGNOSIS — M9902 Segmental and somatic dysfunction of thoracic region: Secondary | ICD-10-CM | POA: Diagnosis not present

## 2023-07-13 DIAGNOSIS — M9904 Segmental and somatic dysfunction of sacral region: Secondary | ICD-10-CM

## 2023-07-13 DIAGNOSIS — M9908 Segmental and somatic dysfunction of rib cage: Secondary | ICD-10-CM

## 2023-07-13 DIAGNOSIS — G43709 Chronic migraine without aura, not intractable, without status migrainosus: Secondary | ICD-10-CM

## 2023-07-13 MED ORDER — METHYLPREDNISOLONE ACETATE 40 MG/ML IJ SUSP
40.0000 mg | Freq: Once | INTRAMUSCULAR | Status: AC
  Administered 2023-07-13: 40 mg via INTRAMUSCULAR

## 2023-07-13 MED ORDER — KETOROLAC TROMETHAMINE 30 MG/ML IJ SOLN
30.0000 mg | Freq: Once | INTRAMUSCULAR | Status: AC
  Administered 2023-07-13: 30 mg via INTRAMUSCULAR

## 2023-07-13 NOTE — Assessment & Plan Note (Signed)
Continues to have chronic migraines, Botox has been helpful, osteopathic manipulation can be helpful and after further evaluation manipulation was done again today.  Continue to work on posture and ergonomics and see if there is any other triggers.  Follow-up again in 6 to 8 weeks

## 2023-07-13 NOTE — Assessment & Plan Note (Signed)
Patient given injection today and tolerated the procedure well, discussed icing regimen and home exercises, discussed avoiding certain activities.  Increase activity slowly otherwise.  Patient still wants to avoid any type of surgical intervention.  Could be a candidate for viscosupplementation again.  Follow-up again in 6 to 8 weeks

## 2023-07-13 NOTE — Patient Instructions (Addendum)
Injected bilateral knees today. See me again in 7 weeks

## 2023-07-22 NOTE — Telephone Encounter (Signed)
 PA status

## 2023-07-26 ENCOUNTER — Ambulatory Visit: Payer: Medicare HMO | Admitting: Internal Medicine

## 2023-07-26 NOTE — Progress Notes (Deleted)
 Subjective:     Patient ID: Rachel [name], female   DOB: 31-Oct-1965, 58 y.o.   MRN: [id number]  HPI Ms. Rachel [name] is a  58 y.o. woman, returning for f/u for DM2, dx 1987, uncontrolled, insulin-dependent, with probable complications (? peripheral neuropathy, ?  Gastroparesis).  Last visit 3 months ago.  Interim history: No increased urination, blurry vision, chest pain.  No steroid injections since last visit.  Reviewed HbA1c levels: Lab Results  Component Value Date   HGBA1C 8.4 (A) 04/25/2023   HGBA1C 8.1 12/17/2022   HGBA1C 8.3 (H) 10/18/2022   HGBA1C 8.4 (A) 08/13/2022   HGBA1C 7.7 (H) 04/07/2022   HGBA1C 7.2 (A) 03/09/2022   HGBA1C 7.2 (A) 11/06/2021   HGBA1C 6.8 (A) 08/04/2021   HGBA1C 6.7 (A) 03/30/2021   HGBA1C 8.3 (H) 12/30/2020   HGBA1C 6.6 (A) 09/16/2020   HGBA1C 7.8 (A) 06/17/2020   HGBA1C 10.0 (H) 08/29/2019   HGBA1C 11.1 (A) 07/02/2019   HGBA1C 10.2 (H) 11/14/2018   HGBA1C 11.2 (H) 08/23/2018   HGBA1C 8.6 (A) 04/26/2018   HGBA1C 0 04/26/2018   HGBA1C 0 (A) 04/26/2018   HGBA1C 0.0 04/26/2018   HGBA1C 12.7 (A) 11/23/2017   HGBA1C 9.2 02/17/2017   HGBA1C 9.2 11/11/2016   HGBA1C 11.5 (H) 06/25/2016   HGBA1C 10.9 04/29/2015   HGBA1C 10.8 (H) 01/09/2015   HGBA1C 12.7 (H) 06/24/2014   HGBA1C 15.1 (H) 10/04/2013   HGBA1C 8.4 (H) 01/11/2013   HGBA1C 8.6 (H) 10/02/2012  02/17/2017: HbA1c calculated from fructosamine is better, at 8.34%, but still high. Prev. 10.1%.  Reviewed history: She came off all DM medicines for 6 mo before the HbA1c in 09/2013. She again ran out of all meds in 04/2016.  She is  on: - Lantus 40 >> ... Semglee 24 >> 30 >> 24-28 >> Toujeo 24-28 units in a.m. - Ozempic 0.5 mg weekly - added 06/2019 ...0.5 >> 0.25 mg weekly (diarrhea, AP >> improved) >> 0.5 mg weekly - Glipizide ER 2.5 mg before b'fast - added 04/2023 Stopped Januvia 100 mg in am >> then Ozempic 0.5 mg weekly She is off Metformin XR 1000 mg 2x a day with b'fast and dinner >>  upset stomach >> stopped 04/2019 We tried Trulicity 1.5 mg weekly >> worked great but had to stop b/c GERD >> stopped. We tried Jardiance 25 mg daily >> started 08/2016 >> but stopped recently 2/2 recurrent UTIs She was on Glipizide 5 mg bid - added 04/2015 >> was not taking it b/c low CBGs. We stopped Glipizide XL 5 mg in 07/2014. She had episodes of hypoglycemia with Amaryl in the past. She was on Bydureon 2 mg weekly (had nausea). Previously on Humalog, then NovoLog -started 12/2020  She checks her sugars 2 times a day: - am: 80, 124-138 >> 87-140, 205 >> 60, 109-120, 189 - after b'fast: 158-251 >> n/c  - before lunch: 75 (fasting, w/o insulin) >> 99 >> n/c  - after lunch: up to 200s >> n/c >> 215 - before dinner:  180-200 >> 137, 150 >> 140-150  >> n/c - after dinner: 166-245 >> n/c - bedtime:  145-150s >> n/c >> 200 >> 103-125, 140 >> n/c She has hypoglycemia awareness in the 90s. Lowest: 38 (while on Trulicity) >> ...   87 >> 60 Highest 600 >> ...  205 >> 215  She saw nutrition in the past.  -+ HL. Last lipids: Lab Results  Component Value Date   CHOL 147 10/18/2022  HDL 56.80 10/18/2022   LDLCALC 57 10/18/2022   LDLDIRECT 170.0 04/07/2022   TRIG 164.0 (H) 10/18/2022   CHOLHDL 3 10/18/2022  On Crestor 40 daily.  -No CKD: Lab Results  Component Value Date   BUN 10 10/18/2022   Lab Results  Component Value Date   CREATININE 0.73 10/18/2022   Lab Results  Component Value Date   MICRALBCREAT 0.8 10/18/2022   MICRALBCREAT 1.0 04/07/2022   MICRALBCREAT 1.2 10/12/2021   MICRALBCREAT 0.8 08/28/2020   MICRALBCREAT 2.0 08/29/2019   MICRALBCREAT 1.4 06/25/2016   MICRALBCREAT 0.6 01/09/2015   MICRALBCREAT 3.4 06/24/2014   MICRALBCREAT 4.9 10/04/2013   MICRALBCREAT 0.3 06/13/2012   -+ Numbness and tingling in the right leg.  Last foot exam 12/17/2022.  - Latest eye appointment was 03/10/2023: No DR.  She had a gastric emptying study (02/03/2021) showed  gastroparesis (but checked on Ozempic!). Started on Reglan, but not using it consistently. She has constipation - on Linzess. 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 She was dx'ed with OSA >> on CPAP. She had tonsillar surgery 02/28/2023 >> was nauseated afterwards >> was off Ozempic >> restarted since, but developed occasional diarrhea, AP >> took Pepto bismol.  She is on disability.  Review of Systems + see HPI  I reviewed pt's medications, allergies, PMH, social hx, family hx, and changes were documented in the history of present illness. Otherwise, unchanged from my initial visit note.  Past Medical History:  Diagnosis Date   ALLERGIC RHINITIS 10/04/2007   Anemia 01/21/2011   Anxiety 11/15/2018   ASTHMA 08/02/2007   INHALERS ONLY IN [place]   Asthma 08/02/2007   Qualifier: Diagnosis of  By: Nena Jordan    Blood transfusion without reported diagnosis    with first child    Cervical radiculopathy 01/21/2011   Cervicogenic headache 04/27/2017   COMMON MIGRAINE 05/15/2009   Depression 01/09/2010   Qualifier: Diagnosis of  By: Jonny Ruiz MD, Len Blalock    DIABETES MELLITUS, TYPE II 08/02/2007   Dysphagia, pharyngoesophageal phase 06/12/2014   GERD 08/02/2007   HYPERLIPIDEMIA 08/02/2007   Hyperlipidemia 08/02/2007   Qualifier: Diagnosis of  By: Nena Jordan    INSOMNIA-SLEEP DISORDER-UNSPEC 01/04/2008   INTERMITTENT VERTIGO 05/15/2009   LIBIDO, DECREASED 01/09/2010   Migraine without aura 05/15/2009   Qualifier: Diagnosis of  By: Jonny Ruiz MD, Len Blalock    Nonallopathic lesion of rib cage 12/21/2017   Nonallopathic lesion of sacral region 07/29/2015   Nonallopathic lesion of thoracic region 07/29/2015   Osteoporosis 04/25/2019   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   Rheumatoid factor positive 02/10/2016   Right knee pain 01/31/2019   Injected January 31, 2019   Type 2 diabetes mellitus with hyperglycemia, with long-term current use of insulin (HCC) 08/02/2007   Qualifier: Diagnosis of   By: Nena Jordan    Past Surgical History:  Procedure Laterality Date   CESAREAN SECTION     x 3   CYST EXCISION     left knee   PAROTIDECTOMY  10/21/2011   Procedure: PAROTIDECTOMY;  Surgeon: Christia Reading, MD;  Location: [facility] OR;  Service: ENT;  Laterality: Left;   Social History   Socioeconomic History   Marital status: Married    Spouse name: Lars Mage   Number of children: 3   Years of education: Degree   Highest education level: Associate degree: occupational, Scientist, product/process development, or vocational program  Occupational History   Occupation: Psychologist, sport and exercise  Employer: [employer]   Occupation: Psychologist, sport and exercise    Employer: [employer]  Tobacco Use   Smoking status: Never   Smokeless tobacco: Never  Vaping Use   Vaping status: Never Used  Substance and Sexual Activity   Alcohol use: No    Alcohol/week: 0.0 standard drinks of alcohol   Drug use: No   Sexual activity: Not on file  Other Topics Concern   Not on file  Social History Narrative   She has 3 daughters.   Patient has a 4 year degree.    Patient working at [employer].    Patient is married to [place].   Social Drivers of Corporate investment banker Strain: Low Risk  (04/19/2023)   Overall Financial Resource Strain (CARDIA)    Difficulty of Paying Living Expenses: Not very hard  Food Insecurity: No Food Insecurity (04/19/2023)   Hunger Vital Sign    Worried About Running Out of Food in the Last Year: Never true    Ran Out of Food in the Last Year: Never true  Transportation Needs: Unmet Transportation Needs (04/19/2023)   PRAPARE - Administrator, Civil Service (Medical): Yes    Lack of Transportation (Non-Medical): Yes  Physical Activity: Unknown (10/18/2022)   Exercise Vital Sign    Days of Exercise per Week: 0 days    Minutes of Exercise per Session: Not on file  Stress: Stress Concern Present (10/18/2022)   [employer] of Occupational Health - Occupational Stress Questionnaire    Feeling of Stress  : Very much  Social Connections: Socially Integrated (04/19/2023)   Social Connection and Isolation Panel [NHANES]    Frequency of Communication with Friends and Family: More than three times a week    Frequency of Social Gatherings with Friends and Family: Twice a week    Attends Religious Services: More than 4 times per year    Active Member of [employer] or Organizations: Yes    Attends Banker Meetings: 1 to 4 times per year    Marital Status: Married  Catering manager Violence: Not on file   Current Outpatient Medications on File Prior to Visit  Medication Sig Dispense Refill   Accu-Chek FastClix Lancets MISC Check blood glucose twice daily 204 each 3   amoxicillin-clavulanate (AUGMENTIN) 875-125 MG tablet Take 1 tablet by mouth 2 (two) times daily. 20 tablet 0   aspirin 81 MG EC tablet Take 81 mg by mouth daily.     Blood Glucose Monitoring Suppl (ACCU-CHEK GUIDE) w/Device KIT Use as instructed to check blood sugar 2 times daily 1 kit 0   botulinum toxin Type A (BOTOX) 200 units injection Inject 155 units IM Into multiple sites in the face,neck,and head every 90 days 1 each 4   butalbital-acetaminophen-caffeine (FIORICET) 50-325-40 MG tablet Take 1 tablet by mouth every 6 (six) hours as needed for headache. Max 2 tablets per day 30 tablet 3   carbamazepine (TEGRETOL) 200 MG tablet TAKE 1 TABLET TWICE DAILY FOR ONE WEEK, THEN INCREASE TO 1 TABLET THREE TIMES DAILY. 90 tablet 3   Cholecalciferol (VITAMIN D3) 50 MCG (2000 UT) capsule TAKE 2 CAPSULES EVERY DAY 180 capsule 3   Continuous Glucose Sensor (DEXCOM G7 SENSOR) MISC Apply 1 sensor every 10 days 9 each 4   cycloSPORINE (RESTASIS) 0.05 % ophthalmic emulsion Instill 1 drop into both eyes twice a day 180 each 3   Diclofenac Sodium (PENNSAID) 2 % SOLN Place 2 g onto the  skin 2 (two) times daily. 112 g 3   DULoxetine (CYMBALTA) 60 MG capsule Take 1 capsule (60 mg total) by mouth daily. 90 capsule 3   esomeprazole (NEXIUM) 40 MG  capsule Take 1 capsule (40 mg total) by mouth 2 (two) times daily before a meal. 180 capsule 3   fluorometholone (FML) 0.1 % ophthalmic suspension Place 1 drop into both eyes 4 (four) times daily. 5 mL 0   Fluticasone-Umeclidin-Vilant (TRELEGY ELLIPTA) 100-62.5-25 MCG/ACT AEPB Inhale 1 puff into the lungs daily. 60 each 11   gabapentin (NEURONTIN) 300 MG capsule Take 2 capsules (600 mg total) by mouth 2 (two) times daily. 120 capsule 3   glipiZIDE (GLUCOTROL XL) 2.5 MG 24 hr tablet Take 1 tablet (2.5 mg total) by mouth daily with breakfast. 90 tablet 3   glucose blood (ACCU-CHEK GUIDE) test strip Use to check blood sugar 2 (two) times daily as directed 200 each 3   HYDROcodone-acetaminophen (HYCET) 7.5-325 mg/15 ml solution Take 15-20 mL (7.5-10 mg of hydrocodone total) by mouth every 4 (four) hours as needed for severe pain (7-10). 473 mL 0   insulin glargine, 1 Unit Dial, (TOUJEO SOLOSTAR) 300 UNIT/ML Solostar Pen Inject 30 Units into the skin daily. 30 mL 2   linaclotide (LINZESS) 145 MCG CAPS capsule Take 1 capsule (145 mcg total) by mouth daily before breakfast 30 capsule 2   metoCLOPramide (REGLAN) 5 MG tablet Take 1 tablet (5 mg total) by mouth 3 (three) times daily 30 minutes before meals 90 tablet 1   Multiple Vitamin (MULTIVITAMIN WITH MINERALS) TABS tablet Take 1 tablet by mouth daily.     neomycin-polymyxin-hydrocortisone (CORTISPORIN) OTIC solution Place 4 drops into the left ear 4 (four) times daily. 10 mL 0   nystatin-triamcinolone ointment (MYCOLOG) APPLY TO THE AFFECTED AREA(S) TWICE DAILY 30 g 11   ondansetron (ZOFRAN ODT) 8 MG disintegrating tablet Take 1 tablet (8 mg total) by mouth every 8 (eight) hours as needed for nausea or vomiting. 30 tablet 0   ondansetron (ZOFRAN) 4 MG tablet Take 1 tablet (4 mg total) by mouth every 8 (eight) hours as needed for nausea & vomiting 40 tablet 1   prednisoLONE acetate (PRED FORTE) 1 % ophthalmic suspension Place 1 drop into both eyes 4 (four)  times daily. 5 mL 0   predniSONE (DELTASONE) 10 MG tablet take 3 tabs by mouth a day for 3 days, then 2 tabs a day for 3 days, then 1 tab per day for 3 days 18 tablet 0   propranolol (INDERAL) 20 MG tablet Take 1.5 tablets (30 mg total) by mouth at bedtime. 90 tablet 1   rosuvastatin (CRESTOR) 20 MG tablet Take 1 tablet (20 mg total) by mouth daily. 90 tablet 3   Semaglutide,0.25 or 0.5MG /DOS, (OZEMPIC, 0.25 OR 0.5 MG/DOSE,) 2 MG/3ML SOPN Inject 0.5 mg into the skin once a week. 9 mL 3   tiZANidine (ZANAFLEX) 4 MG tablet Take 1 tablet (4 mg total) by mouth every 8 (eight) hours as needed for muscle spasms. 90 tablet 5   tiZANidine (ZANAFLEX) 4 MG tablet Take 1 tablet (4 mg total) by mouth at bedtime. 90 tablet 0   tiZANidine (ZANAFLEX) 4 MG tablet TAKE 1 TABLET AT BEDTIME 90 tablet 3   triamcinolone (NASACORT) 55 MCG/ACT AERO nasal inhaler Place 2 sprays into the nose daily. 1 Inhaler 12   Ubrogepant (UBRELVY) 100 MG TABS Take 1 tablet (100 mg total) by mouth as needed. May repeat after 2 hours.  Maximum 2 tablets in 24 hours. 10 tablet 11   No current facility-administered medications on file prior to visit.   Allergies  Allergen Reactions   Lipitor [Atorvastatin]     REACTION: myalgias   Nitroglycerin    Family History  Problem Relation Age of Onset   Hypertension Father    Diabetes Father    Asthma Father    Cervical cancer Maternal Aunt    Heart disease Maternal Aunt    Anesthesia problems Neg Hx    Colon cancer Neg Hx    Breast cancer Neg Hx    Esophageal cancer Neg Hx    Stomach cancer Neg Hx     Objective:   Physical Exam There were no vitals taken for this visit.  Wt Readings from Last 10 Encounters:  07/13/23 147 lb (66.7 kg)  07/06/23 149 lb (67.6 kg)  06/01/23 146 lb (66.2 kg)  04/25/23 148 lb (67.1 kg)  04/19/23 145 lb (65.8 kg)  03/24/23 148 lb (67.1 kg)  02/02/23 144 lb (65.3 kg)  12/17/22 146 lb (66.2 kg)  12/08/22 145 lb (65.8 kg)  12/01/22 143 lb (64.9  kg)   Constitutional: normal weight, in NAD Eyes:  EOMI, no exophthalmos ENT: no neck masses, no cervical lymphadenopathy Cardiovascular: Tachycardia, RR, No MRG Respiratory: CTA B Musculoskeletal: no deformities Skin:no rashes Neurological: no tremor with outstretched hands  Assessment:     1. DM2, uncontrolled, insulin-dependent, with probable complications - ? peripheral neuropathy  2. HL  3.  Overweight  4. PN  Plan:     1. Patient with history of uncontrolled diabetes, on basal insulin and weekly GLP-1 receptor agonist at last visit, with blood sugars mostly at goal at that time in the morning but higher HbA1c, above 8.4%.  At the visit I recommended to increase the Ozempic dose and also to add glipizide ER low-dose before breakfast.  She was not checking sugars later in the day consistently and I advised her to try to do so.  She had nausea and she was not able to take Ozempic for a period of time but she restarted it before last visit.  She had occasional diarrhea and I recommended probiotics as this could have been related to her antibiotic courses.  Of note, we tried to get her on a CGM but this was not approved for her.  - I advised her to: Patient Instructions  Please use: - Toujeo 28 units in a.m. - Ozempic 0.5 mg weekly in a.m. - Glipizide ER 2.5 mg before b'fast  Please return in 3-4 months with your sugar log.   - we checked her HbA1c: 7%  - advised to check sugars at different times of the day - 1x a day, rotating check times - advised for yearly eye exams >> she is UTD - return to clinic in 3-4 months  2. HL -Reviewed latest lipid panel from 10/2022: LDL at goal, much improved, triglycerides slightly elevated: Lab Results  Component Value Date   CHOL 147 10/18/2022   HDL 56.80 10/18/2022   LDLCALC 57 10/18/2022   LDLDIRECT 170.0 04/07/2022   TRIG 164.0 (H) 10/18/2022   CHOLHDL 3 10/18/2022  -She continues Crestor 40 mg daily without side  effects  3.  Overweight -She is on Ozempic which should help with weight loss -She lost 1 pound before last visit  4. PN -B12 was normal at last check: Lab Results  Component Value Date   VITAMINB12 356 10/18/2022  -I recommended alpha  lipoic acid 600 mg twice a day previously, but she did not start  Carlus Pavlov, MD PhD [facility]

## 2023-07-28 NOTE — Telephone Encounter (Signed)
Patient called to check on status of Botox PA

## 2023-07-29 ENCOUNTER — Other Ambulatory Visit (HOSPITAL_COMMUNITY): Payer: Self-pay

## 2023-07-29 MED ORDER — IBUPROFEN 800 MG PO TABS
800.0000 mg | ORAL_TABLET | Freq: Four times a day (QID) | ORAL | 0 refills | Status: AC | PRN
  Filled 2023-07-29: qty 16, 4d supply, fill #0

## 2023-07-29 MED ORDER — AMOXICILLIN 500 MG PO CAPS
500.0000 mg | ORAL_CAPSULE | Freq: Four times a day (QID) | ORAL | 0 refills | Status: DC
  Filled 2023-07-29: qty 40, 10d supply, fill #0

## 2023-08-01 ENCOUNTER — Other Ambulatory Visit (HOSPITAL_COMMUNITY): Payer: Self-pay

## 2023-08-01 MED ORDER — ZOSTER VAC RECOMB ADJUVANTED 50 MCG/0.5ML IM SUSR
0.5000 mL | Freq: Once | INTRAMUSCULAR | 0 refills | Status: AC
  Filled 2023-08-01: qty 0.5, 1d supply, fill #0

## 2023-08-02 ENCOUNTER — Other Ambulatory Visit (HOSPITAL_COMMUNITY): Payer: Self-pay

## 2023-08-04 ENCOUNTER — Other Ambulatory Visit (HOSPITAL_COMMUNITY): Payer: Self-pay

## 2023-08-04 ENCOUNTER — Telehealth: Payer: Self-pay | Admitting: Pharmacy Technician

## 2023-08-04 NOTE — Telephone Encounter (Signed)
Pharmacy Patient Advocate Encounter  [employer] verification has been submitted. Benefit Verification #:  [id number]  Pharmacy PA has been submitted for BOTOX 200u via CoverMyMeds. INSURANCE: [employer] DATE SUBMITTED: 2.20.25 KEY: [id number] Status is pending

## 2023-08-11 ENCOUNTER — Other Ambulatory Visit: Payer: Self-pay | Admitting: Internal Medicine

## 2023-08-11 ENCOUNTER — Telehealth: Payer: Self-pay | Admitting: Pharmacy Technician

## 2023-08-11 ENCOUNTER — Other Ambulatory Visit (HOSPITAL_COMMUNITY): Payer: Self-pay

## 2023-08-11 NOTE — Telephone Encounter (Signed)
 Pharmacy Patient Advocate Encounter  *(ins changed from [facility] to [employer] for pharmacy) Received notification from CoverMyMeds that prior authorization for Ozempic is required/requested.   Insurance verification completed.   The patient is insured through [facility] .   Per test claim: PA required; PA submitted to above mentioned insurance via CoverMyMeds Key/confirmation #/EOC [id number] Status is pending

## 2023-08-11 NOTE — Telephone Encounter (Signed)
 Patient requesting change in pharmacy. Medications transferring to the new pharmacy has been pended in this encounter for PCP approval.  Copied from CRM [id number]. Topic: Clinical - Medication Refill >> Aug 11, 2023 10:41 AM Marica Otter wrote: Most Recent Primary Care Visit:  Provider: Corwin Levins  Department: [facility]  Visit Type: OFFICE VISIT  Date: 04/19/2023  Medication: Patient states all medications that Dr. Jonny Ruiz prescribed for her she's not sure of which medications. Agent not sure which medications to include to refill  Has the patient contacted their pharmacy? No, Switching pharmacy (Agent: If no, request that the patient contact the pharmacy for the refill. If patient does not wish to contact the pharmacy document the reason why and proceed with request.) (Agent: If yes, when and what did the pharmacy advise?)  Is this the correct pharmacy for this prescription? Yes If no, delete pharmacy and type the correct one.  This is the patient's preferred pharmacy:  [place] [facility] [address] [place] [address] Phone: [phone] Fax: [phone]    Has the prescription been filled recently? No  Is the patient out of the medication? No  Has the patient been seen for an appointment in the last year OR does the patient have an upcoming appointment? Yes  Can we respond through MyChart? Yes  Agent: Please be advised that Rx refills may take up to 3 business days. We ask that you follow-up with your pharmacy.

## 2023-08-12 ENCOUNTER — Other Ambulatory Visit (HOSPITAL_COMMUNITY): Payer: Self-pay

## 2023-08-12 ENCOUNTER — Other Ambulatory Visit: Payer: Self-pay

## 2023-08-12 MED ORDER — PROPRANOLOL HCL 20 MG PO TABS
30.0000 mg | ORAL_TABLET | Freq: Every day | ORAL | 1 refills | Status: DC
  Filled 2023-08-12: qty 90, 60d supply, fill #0
  Filled 2023-09-08 – 2023-10-06 (×2): qty 90, 60d supply, fill #1

## 2023-08-12 MED ORDER — TRELEGY ELLIPTA 100-62.5-25 MCG/ACT IN AEPB
1.0000 | INHALATION_SPRAY | Freq: Every day | RESPIRATORY_TRACT | 11 refills | Status: DC
  Filled 2023-08-12: qty 60, 30d supply, fill #0
  Filled 2023-09-08: qty 60, 60d supply, fill #1
  Filled 2023-09-08: qty 60, 30d supply, fill #1
  Filled 2023-09-17: qty 60, 60d supply, fill #2
  Filled 2023-10-24: qty 60, 30d supply, fill #2
  Filled 2023-11-29 (×2): qty 60, 30d supply, fill #3
  Filled 2024-01-18: qty 60, 30d supply, fill #4
  Filled 2024-02-15: qty 60, 30d supply, fill #5
  Filled 2024-03-18: qty 60, 30d supply, fill #6
  Filled 2024-04-20: qty 60, 30d supply, fill #7
  Filled 2024-05-11 – 2024-07-02 (×4): qty 60, 30d supply, fill #8

## 2023-08-12 MED ORDER — VITAMIN D3 50 MCG (2000 UT) PO CAPS
4000.0000 [IU] | ORAL_CAPSULE | Freq: Every day | ORAL | 3 refills | Status: AC
  Filled 2023-08-12: qty 180, 90d supply, fill #0
  Filled 2023-09-08 – 2023-10-06 (×2): qty 180, 90d supply, fill #1
  Filled 2023-11-29 – 2024-02-10 (×2): qty 180, 90d supply, fill #2
  Filled 2024-05-11 – 2024-06-07 (×2): qty 180, 90d supply, fill #3

## 2023-08-12 MED ORDER — TRIAMCINOLONE ACETONIDE 55 MCG/ACT NA AERO
2.0000 | INHALATION_SPRAY | Freq: Every day | NASAL | 12 refills | Status: AC
  Filled 2023-08-12: qty 16.9, 30d supply, fill #0
  Filled 2023-09-17: qty 16.9, 30d supply, fill #1
  Filled 2023-11-29: qty 16.9, 30d supply, fill #2
  Filled 2024-03-18: qty 16.9, 30d supply, fill #3
  Filled 2024-06-07: qty 16.9, 30d supply, fill #4

## 2023-08-12 MED ORDER — NYSTATIN-TRIAMCINOLONE 100000-0.1 UNIT/GM-% EX OINT
1.0000 | TOPICAL_OINTMENT | Freq: Two times a day (BID) | CUTANEOUS | 11 refills | Status: AC
  Filled 2023-08-12: qty 30, 15d supply, fill #0
  Filled 2023-09-08: qty 30, 15d supply, fill #1
  Filled 2023-10-06: qty 30, 15d supply, fill #2
  Filled 2023-11-29: qty 30, 15d supply, fill #3
  Filled 2024-03-18: qty 30, 15d supply, fill #4
  Filled 2024-06-07 – 2024-07-02 (×2): qty 30, 15d supply, fill #5

## 2023-08-12 MED ORDER — ESOMEPRAZOLE MAGNESIUM 40 MG PO CPDR
40.0000 mg | DELAYED_RELEASE_CAPSULE | Freq: Two times a day (BID) | ORAL | 3 refills | Status: DC
  Filled 2023-08-12 (×2): qty 90, 45d supply, fill #0
  Filled 2023-09-08 – 2023-10-24 (×3): qty 180, 90d supply, fill #1
  Filled 2023-11-29 – 2024-01-18 (×2): qty 180, 90d supply, fill #2
  Filled 2024-04-12: qty 180, 90d supply, fill #3

## 2023-08-12 MED ORDER — DULOXETINE HCL 60 MG PO CPEP
60.0000 mg | ORAL_CAPSULE | Freq: Every day | ORAL | 3 refills | Status: DC
  Filled 2023-08-12: qty 90, 90d supply, fill #0
  Filled 2023-09-08 – 2023-11-29 (×2): qty 90, 90d supply, fill #1
  Filled 2024-03-18: qty 90, 90d supply, fill #2
  Filled 2024-07-02: qty 90, 90d supply, fill #3

## 2023-08-12 MED ORDER — NEOMYCIN-POLYMYXIN-HC 3.5-10000-1 OT SOLN
4.0000 [drp] | Freq: Four times a day (QID) | OTIC | 0 refills | Status: DC
Start: 2023-08-12 — End: 2023-09-08
  Filled 2023-08-12: qty 10, 13d supply, fill #0

## 2023-08-12 MED ORDER — LINACLOTIDE 145 MCG PO CAPS
145.0000 ug | ORAL_CAPSULE | Freq: Every day | ORAL | 2 refills | Status: DC
  Filled 2023-08-12: qty 30, 30d supply, fill #0
  Filled 2023-09-08: qty 30, 30d supply, fill #1
  Filled 2023-10-06: qty 30, 30d supply, fill #2

## 2023-08-12 MED ORDER — ROSUVASTATIN CALCIUM 20 MG PO TABS
20.0000 mg | ORAL_TABLET | Freq: Every day | ORAL | 3 refills | Status: DC
  Filled 2023-08-12: qty 90, 90d supply, fill #0
  Filled 2023-09-08: qty 90, 90d supply, fill #1

## 2023-08-15 ENCOUNTER — Other Ambulatory Visit (HOSPITAL_COMMUNITY): Payer: Self-pay

## 2023-08-15 NOTE — Telephone Encounter (Signed)
 Pharmacy Patient Advocate Encounter- Botox BIV-Pharmacy Benefit:  PA was submitted to [facility] and has been approved through: 2.20.25 - 5.20.25 Authorization# [id number]  Please send prescription to Specialty Pharmacy: [facility]: [phone]  Estimated Copay is: 0  Admin Code: [id number]   IS NOT require Prior Auth.  Patient IS NOT eligible for Botox Copay Card, which will make patient's copay as little as zero. Copay card will be provided to pharmacy.

## 2023-08-16 ENCOUNTER — Other Ambulatory Visit: Payer: Self-pay

## 2023-08-16 MED ORDER — BOTOX 200 UNITS IJ SOLR
INTRAMUSCULAR | 4 refills | Status: AC
  Filled 2023-08-16: qty 1, fill #0
  Filled 2023-08-19: qty 1, 90d supply, fill #0
  Filled 2023-11-23: qty 1, 90d supply, fill #1
  Filled 2024-02-21: qty 1, 90d supply, fill #2
  Filled 2024-05-17: qty 1, 90d supply, fill #3

## 2023-08-16 NOTE — Telephone Encounter (Signed)
 Pharmacy Patient Advocate Encounter  Received notification from [facility] that Prior Authorization for Ozempic has been APPROVED through 06/13/24   PA #/Case ID/Reference #: [id number]

## 2023-08-16 NOTE — Telephone Encounter (Signed)
 Patient advised of approval and script sent to [facility].

## 2023-08-18 ENCOUNTER — Other Ambulatory Visit: Payer: Self-pay | Admitting: Pharmacy Technician

## 2023-08-18 ENCOUNTER — Other Ambulatory Visit (HOSPITAL_COMMUNITY): Payer: Self-pay

## 2023-08-18 ENCOUNTER — Other Ambulatory Visit: Payer: Self-pay

## 2023-08-18 NOTE — Progress Notes (Signed)
 Specialty Pharmacy Initial Fill Coordination Note  Rachel [name] is a 58 y.o. female contacted today regarding initial fill of specialty medication(s) OnabotulinumtoxinA (Botox)   Patient requested Courier to Provider Office   Delivery date: 08/25/23   Verified address: [facility]  [address] [place] [address] [place], [place], [address]   Medication will be filled on 3.11.25.   Patient is aware of 0 copayment.

## 2023-08-19 ENCOUNTER — Other Ambulatory Visit: Payer: Self-pay

## 2023-08-22 ENCOUNTER — Other Ambulatory Visit (HOSPITAL_COMMUNITY): Payer: Self-pay

## 2023-08-22 DIAGNOSIS — H52221 Regular astigmatism, right eye: Secondary | ICD-10-CM | POA: Diagnosis not present

## 2023-08-22 DIAGNOSIS — E113293 Type 2 diabetes mellitus with mild nonproliferative diabetic retinopathy without macular edema, bilateral: Secondary | ICD-10-CM | POA: Diagnosis not present

## 2023-08-22 DIAGNOSIS — H524 Presbyopia: Secondary | ICD-10-CM | POA: Diagnosis not present

## 2023-08-22 DIAGNOSIS — H16293 Other keratoconjunctivitis, bilateral: Secondary | ICD-10-CM | POA: Diagnosis not present

## 2023-08-22 DIAGNOSIS — H5213 Myopia, bilateral: Secondary | ICD-10-CM | POA: Diagnosis not present

## 2023-08-22 MED ORDER — XIIDRA 5 % OP SOLN
1.0000 [drp] | Freq: Two times a day (BID) | OPHTHALMIC | 6 refills | Status: DC
Start: 2023-08-22 — End: 2024-03-02
  Filled 2023-08-22: qty 60, 30d supply, fill #0
  Filled 2023-09-08 – 2023-09-17 (×3): qty 60, 30d supply, fill #1
  Filled 2024-01-27: qty 60, 30d supply, fill #2

## 2023-08-22 MED ORDER — LOTEPREDNOL ETABONATE 0.5 % OP SUSP
OPHTHALMIC | 2 refills | Status: AC
  Filled 2023-08-23: qty 5, 28d supply, fill #0
  Filled 2023-09-02: qty 5, 15d supply, fill #1
  Filled 2023-09-08: qty 5, fill #1
  Filled 2023-09-09 – 2023-09-17 (×2): qty 5, 28d supply, fill #1
  Filled 2023-10-14: qty 5, 25d supply, fill #2

## 2023-08-23 ENCOUNTER — Other Ambulatory Visit (HOSPITAL_COMMUNITY): Payer: Self-pay

## 2023-08-24 ENCOUNTER — Other Ambulatory Visit (HOSPITAL_COMMUNITY): Payer: Self-pay

## 2023-08-25 NOTE — Progress Notes (Signed)
 [name] [address] [place] [address] Phone: [phone] Subjective:   Rachel [name], am serving as a scribe for Dr. Antoine Primas.  I'm seeing this patient by the request  of:  Corwin Levins, MD  CC: Back and neck pain  ION:[id number]  Rachel [name] is a 58 y.o. female coming in with complaint of back and neck pain. OMT 07/13/2023. Patient states that her neck pain is worse today. Pain on R side.  Also said that lower back is tight.   Medications patient has been prescribed: None  Taking:         Reviewed prior external information including notes and imaging from previsou exam, outside providers and external EMR if available.   As well as notes that were available from care everywhere and other healthcare systems.  Past medical history, social, surgical and family history all reviewed in electronic medical record.  No pertanent information unless stated regarding to the chief complaint.   Past Medical History:  Diagnosis Date   ALLERGIC RHINITIS 10/04/2007   Anemia 01/21/2011   Anxiety 11/15/2018   ASTHMA 08/02/2007   INHALERS ONLY IN [place]   Asthma 08/02/2007   Qualifier: Diagnosis of  By: Nena Jordan    Blood transfusion without reported diagnosis    with first child    Cervical radiculopathy 01/21/2011   Cervicogenic headache 04/27/2017   COMMON MIGRAINE 05/15/2009   Depression 01/09/2010   Qualifier: Diagnosis of  By: Jonny Ruiz MD, Len Blalock    DIABETES MELLITUS, TYPE II 08/02/2007   Dysphagia, pharyngoesophageal phase 06/12/2014   GERD 08/02/2007   HYPERLIPIDEMIA 08/02/2007   Hyperlipidemia 08/02/2007   Qualifier: Diagnosis of  By: Nena Jordan    INSOMNIA-SLEEP DISORDER-UNSPEC 01/04/2008   INTERMITTENT VERTIGO 05/15/2009   LIBIDO, DECREASED 01/09/2010   Migraine without aura 05/15/2009   Qualifier: Diagnosis of  By: Jonny Ruiz MD, Len Blalock    Nonallopathic lesion of rib cage 12/21/2017   Nonallopathic lesion of sacral region  07/29/2015   Nonallopathic lesion of thoracic region 07/29/2015   Osteoporosis 04/25/2019   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   Rheumatoid factor positive 02/10/2016   Right knee pain 01/31/2019   Injected January 31, 2019   Type 2 diabetes mellitus with hyperglycemia, with long-term current use of insulin (HCC) 08/02/2007   Qualifier: Diagnosis of  By: Nena Jordan     Allergies  Allergen Reactions   Lipitor [Atorvastatin]     REACTION: myalgias   Nitroglycerin      Review of Systems:  No headache, visual changes, nausea, vomiting, diarrhea, constipation, dizziness, abdominal pain, skin rash, fevers, chills, night sweats, weight loss, swollen lymph nodes, body aches, joint swelling, chest pain, shortness of breath, mood changes. POSITIVE muscle aches  Objective  Blood pressure 104/80, pulse 74, height 5\' 1"  (1.549 m), weight 138 lb (62.6 kg), SpO2 98%.   General: No apparent distress alert and oriented x3 mood and affect normal, dressed appropriately.  HEENT: Pupils equal, extraocular movements intact  Respiratory: Patient's speak in full sentences and does not appear short of breath  Cardiovascular: No lower extremity edema, non tender, no erythema  Gait MSK:  Back does have some loss lordosis noted.  And significant tightness noted to the neck right greater than left. Right-sided scapular dyskinesis noted as well.  Osteopathic findings  C2 flexed rotated and side bent right C6 flexed rotated and side bent left T3 extended rotated and side  bent right inhaled rib T7 extended rotated and side bent right L2 flexed rotated and side bent right Sacrum right on right       Assessment and Plan:  Cervical stenosis of spine Known cervical spinal stenosis previously.  Do believe that patient is having aggravation of some of the tightness secondary to some of her allergies as well.  Discussed which activities to do and which ones to avoid.  Increase activity  slowly.  Discussed icing regimen and home exercises.  Increase activity slowly.  Follow-up again in 6 to 8 weeks. Toradol and Depo-Medrol injection given today.  Discussed different medications such as the Singulair and Flonase to take at night for the allergies.  Nonallopathic problems  Decision today to treat with OMT was based on Physical Exam  After verbal consent patient was treated with HVLA, ME, FPR techniques in cervical, rib, thoracic, lumbar, and sacral  areas  Patient tolerated the procedure well with improvement in symptoms  Patient given exercises, stretches and lifestyle modifications  See medications in patient instructions if given  Patient will follow up in 4-8 weeks    The above documentation has been reviewed and is accurate and complete Judi Saa, DO          Note: This dictation was prepared with Dragon dictation along with smaller phrase technology. Any transcriptional errors that result from this process are unintentional.

## 2023-08-26 ENCOUNTER — Ambulatory Visit (INDEPENDENT_AMBULATORY_CARE_PROVIDER_SITE_OTHER): Payer: 59 | Admitting: Neurology

## 2023-08-26 DIAGNOSIS — G43709 Chronic migraine without aura, not intractable, without status migrainosus: Secondary | ICD-10-CM | POA: Diagnosis not present

## 2023-08-26 MED ORDER — KETOROLAC TROMETHAMINE 60 MG/2ML IM SOLN
60.0000 mg | Freq: Once | INTRAMUSCULAR | Status: AC
  Administered 2023-08-26: 60 mg via INTRAMUSCULAR

## 2023-08-26 MED ORDER — ONABOTULINUMTOXINA 100 UNITS IJ SOLR
200.0000 [IU] | Freq: Once | INTRAMUSCULAR | Status: AC
  Administered 2023-08-26: 155 [IU] via INTRAMUSCULAR

## 2023-08-26 MED ORDER — DIPHENHYDRAMINE HCL 50 MG/ML IJ SOLN
50.0000 mg | Freq: Once | INTRAMUSCULAR | Status: AC
  Administered 2023-08-26: 25 mg via INTRAMUSCULAR

## 2023-08-26 MED ORDER — METOCLOPRAMIDE HCL 5 MG/ML IJ SOLN
10.0000 mg | Freq: Once | INTRAMUSCULAR | Status: AC
Start: 2023-08-26 — End: 2023-08-26
  Administered 2023-08-26: 10 mg via INTRAMUSCULAR

## 2023-08-26 NOTE — Progress Notes (Signed)

## 2023-08-31 ENCOUNTER — Ambulatory Visit (INDEPENDENT_AMBULATORY_CARE_PROVIDER_SITE_OTHER): Payer: 59 | Admitting: Family Medicine

## 2023-08-31 VITALS — BP 104/80 | HR 74 | Ht 61.0 in | Wt 138.0 lb

## 2023-08-31 DIAGNOSIS — M9908 Segmental and somatic dysfunction of rib cage: Secondary | ICD-10-CM

## 2023-08-31 DIAGNOSIS — M9901 Segmental and somatic dysfunction of cervical region: Secondary | ICD-10-CM | POA: Diagnosis not present

## 2023-08-31 DIAGNOSIS — M9903 Segmental and somatic dysfunction of lumbar region: Secondary | ICD-10-CM | POA: Diagnosis not present

## 2023-08-31 DIAGNOSIS — M9902 Segmental and somatic dysfunction of thoracic region: Secondary | ICD-10-CM | POA: Diagnosis not present

## 2023-08-31 DIAGNOSIS — M4802 Spinal stenosis, cervical region: Secondary | ICD-10-CM

## 2023-08-31 DIAGNOSIS — M2550 Pain in unspecified joint: Secondary | ICD-10-CM | POA: Diagnosis not present

## 2023-08-31 DIAGNOSIS — M9904 Segmental and somatic dysfunction of sacral region: Secondary | ICD-10-CM

## 2023-08-31 MED ORDER — KETOROLAC TROMETHAMINE 30 MG/ML IJ SOLN
30.0000 mg | Freq: Once | INTRAMUSCULAR | Status: AC
  Administered 2023-08-31: 30 mg via INTRAMUSCULAR

## 2023-08-31 MED ORDER — METHYLPREDNISOLONE ACETATE 40 MG/ML IJ SUSP
40.0000 mg | Freq: Once | INTRAMUSCULAR | Status: AC
  Administered 2023-08-31: 40 mg via INTRAMUSCULAR

## 2023-08-31 NOTE — Assessment & Plan Note (Signed)
 Known cervical spinal stenosis previously.  Do believe that patient is having aggravation of some of the tightness secondary to some of her allergies as well.  Discussed which activities to do and which ones to avoid.  Increase activity slowly.  Discussed icing regimen and home exercises.  Increase activity slowly.  Follow-up again in 6 to 8 weeks.

## 2023-08-31 NOTE — Addendum Note (Signed)
 Addended by: Rolland Bimler D on: 08/31/2023 08:17 AM   Modules accepted: Orders

## 2023-08-31 NOTE — Patient Instructions (Signed)
 Injections in backside today Try stretches See me in 6-7 weeks

## 2023-09-02 ENCOUNTER — Other Ambulatory Visit (HOSPITAL_COMMUNITY): Payer: Self-pay

## 2023-09-08 ENCOUNTER — Other Ambulatory Visit: Payer: Self-pay | Admitting: Internal Medicine

## 2023-09-08 ENCOUNTER — Other Ambulatory Visit: Payer: Self-pay

## 2023-09-08 ENCOUNTER — Other Ambulatory Visit (HOSPITAL_COMMUNITY): Payer: Self-pay

## 2023-09-08 MED ORDER — NEOMYCIN-POLYMYXIN-HC 3.5-10000-1 OT SOLN
4.0000 [drp] | Freq: Four times a day (QID) | OTIC | 0 refills | Status: DC
  Filled 2023-09-08: qty 10, 13d supply, fill #0

## 2023-09-08 MED FILL — Semaglutide Soln Pen-inj 0.25 or 0.5 MG/DOSE (2 MG/3ML): SUBCUTANEOUS | 84 days supply | Qty: 9 | Fill #0 | Status: AC

## 2023-09-09 ENCOUNTER — Other Ambulatory Visit: Payer: Self-pay | Admitting: Internal Medicine

## 2023-09-09 ENCOUNTER — Other Ambulatory Visit: Payer: Self-pay

## 2023-09-09 ENCOUNTER — Other Ambulatory Visit (HOSPITAL_COMMUNITY): Payer: Self-pay

## 2023-09-09 NOTE — Telephone Encounter (Signed)
 Copied from CRM [id number]. Topic: Clinical - Medication Refill >> Sep 09, 2023  2:25 PM Marica Otter wrote: Most Recent Primary Care Visit:  Provider: Corwin Levins  Department: [facility] [place]  Visit Type: OFFICE VISIT  Date: 04/19/2023  Medication: butalbital-acetaminophen-caffeine (FIORICET) 50-325-40 MG tablet  Has the patient contacted their pharmacy? Yes, have to reach out to provider prescription expired (Agent: If no, request that the patient contact the pharmacy for the refill. If patient does not wish to contact the pharmacy document the reason why and proceed with request.) (Agent: If yes, when and what did the pharmacy advise?)  Is this the correct pharmacy for this prescription? Yes If no, delete pharmacy and type the correct one.  This is the patient's preferred pharmacy:  [place] [facility] [address] [place] [address] Phone: [phone] Fax: [phone]    Has the prescription been filled recently? No  Is the patient out of the medication? Yes  Has the patient been seen for an appointment in the last year OR does the patient have an upcoming appointment? Yes  Can we respond through MyChart? Yes  Agent: Please be advised that Rx refills may take up to 3 business days. We ask that you follow-up with your pharmacy.

## 2023-09-12 ENCOUNTER — Encounter: Payer: Self-pay | Admitting: Neurology

## 2023-09-13 ENCOUNTER — Other Ambulatory Visit (HOSPITAL_COMMUNITY): Payer: Self-pay

## 2023-09-13 MED ORDER — BUTALBITAL-APAP-CAFFEINE 50-325-40 MG PO TABS
1.0000 | ORAL_TABLET | Freq: Four times a day (QID) | ORAL | 3 refills | Status: DC | PRN
  Filled 2023-09-13: qty 30, 8d supply, fill #0
  Filled 2023-10-06: qty 30, 8d supply, fill #1
  Filled 2023-11-29: qty 30, 8d supply, fill #2
  Filled 2024-01-18: qty 30, 8d supply, fill #3

## 2023-09-15 ENCOUNTER — Telehealth: Payer: Self-pay

## 2023-09-15 NOTE — Telephone Encounter (Signed)
 PA needed for [employer]

## 2023-09-16 ENCOUNTER — Ambulatory Visit

## 2023-09-16 VITALS — Ht 61.0 in | Wt 140.0 lb

## 2023-09-16 DIAGNOSIS — Z0000 Encounter for general adult medical examination without abnormal findings: Secondary | ICD-10-CM | POA: Diagnosis not present

## 2023-09-16 NOTE — Progress Notes (Signed)
 Subjective:   Rachel [name] is a 58 y.o. who presents for a Medicare Wellness preventive visit.  Visit Complete: Virtual I connected with  Rachel [name] on 09/16/23 by a video and audio enabled telemedicine application and verified that I am speaking with the correct person using two identifiers.  Patient Location: Home  Provider Location: Office/Clinic  I discussed the limitations of evaluation and management by telemedicine. The patient expressed understanding and agreed to proceed.  Vital Signs: Because this visit was a virtual/telehealth visit, some criteria may be missing or patient reported. Any vitals not documented were not able to be obtained and vitals that have been documented are patient reported.  Persons Participating in Visit: Patient.  AWV Questionnaire: No: Patient Medicare AWV questionnaire was not completed prior to this visit.  Cardiac Risk Factors include: advanced age (>66men, >14 women);dyslipidemia;diabetes mellitus     Objective:    Today's Vitals   09/16/23 0926  Weight: 140 lb (63.5 kg)  Height: 5\' 1"  (1.549 m)   Body mass index is 26.45 kg/m.     09/16/2023    9:23 AM 07/06/2023    3:26 PM 06/04/2022    8:13 AM 06/29/2021    8:40 AM 01/01/2021    9:02 AM 06/19/2020    8:40 AM 02/21/2020   11:49 AM  Advanced Directives  Does Patient Have a Medical Advance Directive? Yes No Yes No No Yes Yes  Type of Estate agent of [place];Living will  Healthcare Power of [place];Living will   Healthcare Power of [place];Living will Healthcare Power of [place];Living will  Copy of Healthcare Power of Attorney in Chart? No - copy requested          Current Medications (verified) Outpatient Encounter Medications as of 09/16/2023  Medication Sig   Accu-Chek FastClix Lancets MISC Check blood glucose twice daily   aspirin 81 MG EC tablet Take 81 mg by mouth daily.   Blood Glucose Monitoring Suppl (ACCU-CHEK GUIDE) w/Device KIT Use as  instructed to check blood sugar 2 times daily   botulinum toxin Type A (BOTOX) 200 units injection Inject 155 units IM Into multiple sites in the face,neck,and head every 90 days   butalbital-acetaminophen-caffeine (FIORICET) 50-325-40 MG tablet Take 1 tablet by mouth every 6 (six) hours as needed for headache.   carbamazepine (TEGRETOL) 200 MG tablet TAKE 1 TABLET TWICE DAILY FOR ONE WEEK, THEN INCREASE TO 1 TABLET THREE TIMES DAILY.   Cholecalciferol (VITAMIN D3) 50 MCG (2000 UT) capsule Take 2 capsules (4,000 Units total) by mouth daily.   Continuous Glucose Sensor (DEXCOM G7 SENSOR) MISC Apply 1 sensor every 10 days   cycloSPORINE (RESTASIS) 0.05 % ophthalmic emulsion Instill 1 drop into both eyes twice a day   Diclofenac Sodium (PENNSAID) 2 % SOLN Place 2 g onto the skin 2 (two) times daily.   DULoxetine (CYMBALTA) 60 MG capsule Take 1 capsule (60 mg total) by mouth daily.   esomeprazole (NEXIUM) 40 MG capsule Take 1 capsule (40 mg total) by mouth 2 (two) times daily before a meal.   fluorometholone (FML) 0.1 % ophthalmic suspension Place 1 drop into both eyes 4 (four) times daily.   Fluticasone-Umeclidin-Vilant (TRELEGY ELLIPTA) 100-62.5-25 MCG/ACT AEPB Inhale 1 puff into the lungs daily.   gabapentin (NEURONTIN) 300 MG capsule Take 2 capsules (600 mg total) by mouth 2 (two) times daily.   glipiZIDE (GLUCOTROL XL) 2.5 MG 24 hr tablet Take 1 tablet (2.5 mg total) by mouth daily with breakfast.  glucose blood (ACCU-CHEK GUIDE) test strip Use to check blood sugar 2 (two) times daily as directed   ibuprofen (ADVIL) 800 MG tablet Take 1 tablet (800 mg total) by mouth every 6 (six) hours as needed for pain.   insulin glargine, 1 Unit Dial, (TOUJEO SOLOSTAR) 300 UNIT/ML Solostar Pen Inject 30 Units into the skin daily.   Lifitegrast (XIIDRA) 5 % SOLN Place 1 drop into both eyes 2 (two) times daily.   linaclotide (LINZESS) 145 MCG CAPS capsule Take 1 capsule (145 mcg total) by mouth daily before  breakfast   loteprednol (LOTEMAX) 0.5 % ophthalmic suspension Place 1 drop into both eyes 4 (four) times daily for 7 days, THEN 1 drop 3 (three) times daily for 7 days, THEN 1 drop 2 (two) times daily for 14 days.   metoCLOPramide (REGLAN) 5 MG tablet Take 1 tablet (5 mg total) by mouth 3 (three) times daily 30 minutes before meals   Multiple Vitamin (MULTIVITAMIN WITH MINERALS) TABS tablet Take 1 tablet by mouth daily.   neomycin-polymyxin-hydrocortisone (CORTISPORIN) OTIC solution Place 4 drops into the left ear 4 (four) times daily.   nystatin-triamcinolone ointment (MYCOLOG) Apply 1 Application topically 2 (two) times daily to affected area(s).   ondansetron (ZOFRAN ODT) 8 MG disintegrating tablet Take 1 tablet (8 mg total) by mouth every 8 (eight) hours as needed for nausea or vomiting.   ondansetron (ZOFRAN) 4 MG tablet Take 1 tablet (4 mg total) by mouth every 8 (eight) hours as needed for nausea & vomiting   prednisoLONE acetate (PRED FORTE) 1 % ophthalmic suspension Place 1 drop into both eyes 4 (four) times daily.   predniSONE (DELTASONE) 10 MG tablet take 3 tabs by mouth a day for 3 days, then 2 tabs a day for 3 days, then 1 tab per day for 3 days   propranolol (INDERAL) 20 MG tablet Take 1.5 tablets (30 mg total) by mouth at bedtime.   rosuvastatin (CRESTOR) 20 MG tablet Take 1 tablet (20 mg total) by mouth daily.   Semaglutide,0.25 or 0.5MG /DOS, (OZEMPIC, 0.25 OR 0.5 MG/DOSE,) 2 MG/3ML SOPN Inject 0.5 mg into the skin once a week.   tiZANidine (ZANAFLEX) 4 MG tablet Take 1 tablet (4 mg total) by mouth every 8 (eight) hours as needed for muscle spasms.   triamcinolone (NASACORT) 55 MCG/ACT AERO nasal inhaler Place 2 sprays into the nose daily.   Ubrogepant (UBRELVY) 100 MG TABS Take 1 tablet (100 mg total) by mouth as needed. May repeat after 2 hours.  Maximum 2 tablets in 24 hours.   [DISCONTINUED] amoxicillin (AMOXIL) 500 MG capsule Take 1 capsule (500 mg total) by mouth every 6 (six)  hours until finished.   [DISCONTINUED] amoxicillin-clavulanate (AUGMENTIN) 875-125 MG tablet Take 1 tablet by mouth 2 (two) times daily.   [DISCONTINUED] HYDROcodone-acetaminophen (HYCET) 7.5-325 mg/15 ml solution Take 15-20 mL (7.5-10 mg of hydrocodone total) by mouth every 4 (four) hours as needed for severe pain (7-10).   [DISCONTINUED] tiZANidine (ZANAFLEX) 4 MG tablet Take 1 tablet (4 mg total) by mouth at bedtime.   [DISCONTINUED] tiZANidine (ZANAFLEX) 4 MG tablet TAKE 1 TABLET AT BEDTIME   No facility-administered encounter medications on file as of 09/16/2023.    Allergies (verified) Lipitor [atorvastatin] and Nitroglycerin   History: Past Medical History:  Diagnosis Date   ALLERGIC RHINITIS 10/04/2007   Anemia 01/21/2011   Anxiety 11/15/2018   ASTHMA 08/02/2007   INHALERS ONLY IN [place]   Asthma 08/02/2007   Qualifier: Diagnosis of  By: Nena Jordan    Blood transfusion without reported diagnosis    with first child    Cervical radiculopathy 01/21/2011   Cervicogenic headache 04/27/2017   COMMON MIGRAINE 05/15/2009   Depression 01/09/2010   Qualifier: Diagnosis of  By: Jonny Ruiz MD, Len Blalock    DIABETES MELLITUS, TYPE II 08/02/2007   Dysphagia, pharyngoesophageal phase 06/12/2014   GERD 08/02/2007   HYPERLIPIDEMIA 08/02/2007   Hyperlipidemia 08/02/2007   Qualifier: Diagnosis of  By: Nena Jordan    INSOMNIA-SLEEP DISORDER-UNSPEC 01/04/2008   INTERMITTENT VERTIGO 05/15/2009   LIBIDO, DECREASED 01/09/2010   Migraine without aura 05/15/2009   Qualifier: Diagnosis of  By: Jonny Ruiz MD, Len Blalock    Nonallopathic lesion of rib cage 12/21/2017   Nonallopathic lesion of sacral region 07/29/2015   Nonallopathic lesion of thoracic region 07/29/2015   Osteoporosis 04/25/2019   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   Rheumatoid factor positive 02/10/2016   Right knee pain 01/31/2019   Injected January 31, 2019   Type 2 diabetes mellitus with hyperglycemia, with long-term current use of  insulin (HCC) 08/02/2007   Qualifier: Diagnosis of  By: Nena Jordan    Past Surgical History:  Procedure Laterality Date   CESAREAN SECTION     x 3   CYST EXCISION     left knee   PAROTIDECTOMY  10/21/2011   Procedure: PAROTIDECTOMY;  Surgeon: Christia Reading, MD;  Location: [facility] OR;  Service: ENT;  Laterality: Left;   Family History  Problem Relation Age of Onset   Hypertension Father    Diabetes Father    Asthma Father    Cervical cancer Maternal Aunt    Heart disease Maternal Aunt    Anesthesia problems Neg Hx    Colon cancer Neg Hx    Breast cancer Neg Hx    Esophageal cancer Neg Hx    Stomach cancer Neg Hx    Social History   Socioeconomic History   Marital status: Married    Spouse name: Lars Mage   Number of children: 3   Years of education: Degree   Highest education level: Associate degree: occupational, Scientist, product/process development, or vocational program  Occupational History   Occupation: Curator: [employer]   Occupation: Psychologist, sport and exercise    Employer: [employer]  Tobacco Use   Smoking status: Never    Passive exposure: Never   Smokeless tobacco: Never  Vaping Use   Vaping status: Never Used  Substance and Sexual Activity   Alcohol use: No    Alcohol/week: 0.0 standard drinks of alcohol   Drug use: No   Sexual activity: Not on file  Other Topics Concern   Not on file  Social History Narrative   She has 3 daughters.   Patient has a 4 year degree.    Patient working at [employer].    Patient is married to [place].   Social Drivers of Corporate investment banker Strain: Low Risk  (09/16/2023)   Overall Financial Resource Strain (CARDIA)    Difficulty of Paying Living Expenses: Not hard at all  Food Insecurity: No Food Insecurity (09/16/2023)   Hunger Vital Sign    Worried About Running Out of Food in the Last Year: Never true    Ran Out of Food in the Last Year: Never true  Transportation Needs: No Transportation Needs (09/16/2023)   PRAPARE -  Administrator, Civil Service (Medical): No  Lack of Transportation (Non-Medical): No  Physical Activity: Insufficiently Active (09/16/2023)   Exercise Vital Sign    Days of Exercise per Week: 3 days    Minutes of Exercise per Session: 30 min  Stress: No Stress Concern Present (09/16/2023)   [employer] of Occupational Health - Occupational Stress Questionnaire    Feeling of Stress : Not at all  Social Connections: Socially Integrated (09/16/2023)   Social Connection and Isolation Panel [NHANES]    Frequency of Communication with Friends and Family: More than three times a week    Frequency of Social Gatherings with Friends and Family: More than three times a week    Attends Religious Services: More than 4 times per year    Active Member of [employer] or Organizations: Yes    Attends Engineer, structural: More than 4 times per year    Marital Status: Married    Tobacco Counseling Counseling given: No    Clinical Intake:  Pre-visit preparation completed: Yes  Pain : No/denies pain     BMI - recorded: 26.45 Nutritional Status: BMI 25 -29 Overweight Nutritional Risks: None Diabetes: Yes CBG done?: No Did pt. bring in CBG monitor from home?: No  Lab Results  Component Value Date   HGBA1C 8.4 (A) 04/25/2023   HGBA1C 8.1 12/17/2022   HGBA1C 8.3 (H) 10/18/2022     How often do you need to have someone help you when you read instructions, pamphlets, or other written materials from your doctor or pharmacy?: 1 - Never  Interpreter Needed?: No  Information entered by :: Hassell Halim, CMA   Activities of Daily Living     09/16/2023    9:30 AM  In your present state of health, do you have any difficulty performing the following activities:  Hearing? 0  Vision? 0  Difficulty concentrating or making decisions? 0  Walking or climbing stairs? 0  Dressing or bathing? 0  Doing errands, shopping? 0  Preparing Food and eating ? N  Using the Toilet? N  In  the past six months, have you accidently leaked urine? N  Do you have problems with loss of bowel control? N  Managing your Medications? N  Managing your Finances? N  Housekeeping or managing your Housekeeping? N    Patient Care Team: Corwin Levins, MD as PCP - Alonza Bogus, Gennie Alma, MD as PCP - Cardiology (Cardiology)  Indicate any recent Medical Services you may have received from other than [facility] providers in the past year (date may be approximate).     Assessment:   This is a routine wellness examination for [facility].  Hearing/Vision screen Hearing Screening - Comments:: Denies hearing difficulties   Vision Screening - Comments:: Wears rx glasses - up to date with routine eye exams with [facility]   Goals Addressed               This Visit's Progress     Patient Stated (pt-stated)        Patient stated she plans to stay active and tone up.       Depression Screen     09/16/2023    9:36 AM 04/19/2023    8:01 AM 10/18/2022    2:29 PM 04/14/2022    1:04 PM 10/12/2021    8:24 AM 10/12/2021    8:05 AM 03/16/2021   11:16 AM  PHQ 2/9 Scores  PHQ - 2 Score 0 0 0 0 1 0 0  PHQ- 9 Score 0  5  4     Fall Risk     09/16/2023    9:31 AM 07/06/2023    3:26 PM 04/19/2023    8:01 AM 10/18/2022    2:28 PM 06/04/2022    8:13 AM  Fall Risk   Falls in the past year? 1 0 1 1 1   Number falls in past yr: 1 0 1 0 0  Comment 6 falls      Injury with Fall? 0 0 0 0 0  Risk for fall due to : History of fall(s);Impaired balance/gait  History of fall(s) History of fall(s)   Follow up Falls evaluation completed;Falls prevention discussed Falls evaluation completed Falls evaluation completed Falls evaluation completed Falls evaluation completed    MEDICARE RISK AT HOME:  Medicare Risk at Home Any stairs in or around the home?: Yes If so, are there any without handrails?: No Home free of loose throw rugs in walkways, pet beds, electrical cords, etc?: Yes Adequate lighting in your  home to reduce risk of falls?: Yes Life alert?: No Use of a cane, walker or w/c?: No Grab bars in the bathroom?: No Shower chair or bench in shower?: No Elevated toilet seat or a handicapped toilet?: No  TIMED UP AND GO:  Was the test performed?  No  Cognitive Function: 6CIT completed        09/16/2023    9:32 AM  6CIT Screen  What Year? 0 points  What month? 0 points  What time? 0 points  Count back from 20 2 points  Months in reverse 0 points  Repeat phrase 0 points  Total Score 2 points    Immunizations Immunization History  Administered Date(s) Administered   H1N1 04/15/2008   Influenza Whole 04/22/2008, 03/14/2010   Influenza,inj,Quad PF,6+ Mos 03/13/2014, 02/28/2019, 02/26/2020   Influenza-Unspecified 03/09/2017, 02/14/2018   Moderna Sars-Covid-2 Vaccination 04/25/2020, 06/03/2020   Pneumococcal Conjugate-13 07/09/2015   Pneumococcal Polysaccharide-23 07/08/2008, 06/25/2016   Td 10/03/2006   Tdap 06/25/2016   Zoster Recombinant(Shingrix) 04/19/2023, 08/01/2023    Screening Tests Health Maintenance  Topic Date Due   Colonoscopy  10/18/2023 (Originally 05/21/2009)   Diabetic kidney evaluation - eGFR measurement  10/18/2023   Diabetic kidney evaluation - Urine ACR  10/18/2023   HEMOGLOBIN A1C  10/23/2023   FOOT EXAM  12/17/2023   INFLUENZA VACCINE  01/13/2024   OPHTHALMOLOGY EXAM  03/09/2024   Medicare Annual Wellness (AWV)  09/15/2024   Fecal DNA (Cologuard)  11/10/2024   MAMMOGRAM  11/18/2024   DTaP/Tdap/Td (3 - Td or Tdap) 06/25/2026   Cervical Cancer Screening (HPV/Pap Cotest)  01/15/2027   Pneumococcal Vaccine 74-69 Years old (3 of 3 - PPSV23 or PCV20) 05/21/2029   Hepatitis C Screening  Completed   HIV Screening  Completed   Zoster Vaccines- Shingrix  Completed   HPV VACCINES  Aged Out   COVID-19 Vaccine  Discontinued    Health Maintenance  There are no preventive care reminders to display for this patient. Health Maintenance Items Addressed:  09/16/2023  Cologuard status: Completed 11/10/2021 by PCP - resuts > Negative.  Due in 3 years.  Additional Screening:  Vision Screening: Recommended annual ophthalmology exams for early detection of glaucoma and other disorders of the eye.  Pt stated she is seen annually w/[facility] in [place], [place].  Dental Screening: Recommended annual dental exams for proper oral hygiene  Community Resource Referral / Chronic Care Management: CRR required this visit?  No   CCM required this visit?  No     Plan:     I have personally reviewed and noted the following in the patient's chart:   Medical and social history Use of alcohol, tobacco or illicit drugs  Current medications and supplements including opioid prescriptions. Patient is not currently taking opioid prescriptions. Functional ability and status Nutritional status Physical activity Advanced directives List of other physicians Hospitalizations, surgeries, and ER visits in previous 12 months Vitals Screenings to include cognitive, depression, and falls Referrals and appointments  In addition, I have reviewed and discussed with patient certain preventive protocols, quality metrics, and best practice recommendations. A written personalized care plan for preventive services as well as general preventive health recommendations were provided to patient.     Darreld Mclean, CMA   09/16/2023   After Visit Summary: (MyChart) Due to this being a telephonic visit, the after visit summary with patients personalized plan was offered to patient via MyChart   Notes: Nothing significant to report at this time.

## 2023-09-16 NOTE — Patient Instructions (Addendum)
 Ms. Rachel [name] , Thank you for taking time to come for your Medicare Wellness Visit. I appreciate your ongoing commitment to your health goals. Please review the following plan we discussed and let me know if I can assist you in the future.   Referrals/Orders/Follow-Ups/Clinician Recommendations: Aim for 30 minutes of exercise or brisk walking, 6-8 glasses of water, and 5 servings of fruits and vegetables each day.   This is a list of the screening recommended for you and due dates:  Health Maintenance  Topic Date Due   Colon Cancer Screening  10/18/2023*   Yearly kidney function blood test for diabetes  10/18/2023   Yearly kidney health urinalysis for diabetes  10/18/2023   Hemoglobin A1C  10/23/2023   Complete foot exam   12/17/2023   Flu Shot  01/13/2024   Eye exam for diabetics  03/09/2024   Medicare Annual Wellness Visit  09/15/2024   Cologuard (Stool DNA test)  11/10/2024   Mammogram  11/18/2024   DTaP/Tdap/Td vaccine (3 - Td or Tdap) 06/25/2026   Pap with HPV screening  01/15/2027   Pneumococcal Vaccination (3 of 3 - PPSV23 or PCV20) 05/21/2029   Hepatitis C Screening  Completed   HIV Screening  Completed   Zoster (Shingles) Vaccine  Completed   HPV Vaccine  Aged Out   COVID-19 Vaccine  Discontinued  *Topic was postponed. The date shown is not the original due date.    Advanced directives: (Declined) Advance directive discussed with you today. Even though you declined this today, please call our office should you change your mind, and we can give you the proper paperwork for you to fill out.  Next Medicare Annual Wellness Visit scheduled for next year: Yes

## 2023-09-19 ENCOUNTER — Other Ambulatory Visit (HOSPITAL_COMMUNITY): Payer: Self-pay

## 2023-09-19 ENCOUNTER — Other Ambulatory Visit: Payer: Self-pay

## 2023-09-20 ENCOUNTER — Other Ambulatory Visit (HOSPITAL_COMMUNITY): Payer: Self-pay

## 2023-09-20 ENCOUNTER — Telehealth: Payer: Self-pay | Admitting: Pharmacy Technician

## 2023-09-20 NOTE — Telephone Encounter (Signed)
 PA has been submitted, and telephone encounter has been created. Please see telephone encounter dated 4.8.25.

## 2023-09-20 NOTE — Telephone Encounter (Signed)
 Pharmacy Patient Advocate Encounter   Received notification from Pt Calls Messages that prior authorization for UBRELVY 100MG  is required/requested.   Insurance verification completed.   The patient is insured through [facility] .   Per test claim: PA required; PA submitted to above mentioned insurance via CoverMyMeds Key/confirmation #/EOC [id number] Status is pending

## 2023-09-22 ENCOUNTER — Other Ambulatory Visit (HOSPITAL_COMMUNITY): Payer: Self-pay

## 2023-09-22 DIAGNOSIS — H16293 Other keratoconjunctivitis, bilateral: Secondary | ICD-10-CM | POA: Diagnosis not present

## 2023-09-22 DIAGNOSIS — H04123 Dry eye syndrome of bilateral lacrimal glands: Secondary | ICD-10-CM | POA: Diagnosis not present

## 2023-09-22 MED ORDER — CYCLOSPORINE 0.05 % OP EMUL
1.0000 [drp] | Freq: Two times a day (BID) | OPHTHALMIC | 6 refills | Status: AC
  Filled 2023-09-22 – 2023-10-06 (×2): qty 60, 30d supply, fill #0
  Filled 2023-11-29: qty 60, 30d supply, fill #1
  Filled 2024-01-18: qty 60, 30d supply, fill #2
  Filled 2024-06-07: qty 60, 30d supply, fill #3

## 2023-09-28 ENCOUNTER — Other Ambulatory Visit (HOSPITAL_COMMUNITY): Payer: Self-pay

## 2023-09-28 NOTE — Telephone Encounter (Signed)
 Pharmacy Patient Advocate Encounter  Received notification from [employer] that Prior Authorization for UBRELVY 100MG  has been APPROVED from 4.8.25 to 12.31.25. Ran test claim, Copay is $0. This test claim was processed through [facility]- copay amounts may vary at other pharmacies due to pharmacy/plan contracts, or as the patient moves through the different stages of their insurance plan.   PA #/Case ID/Reference #: [id number]

## 2023-10-06 ENCOUNTER — Other Ambulatory Visit: Payer: Self-pay

## 2023-10-06 ENCOUNTER — Other Ambulatory Visit (HOSPITAL_COMMUNITY): Payer: Self-pay

## 2023-10-06 ENCOUNTER — Other Ambulatory Visit: Payer: Self-pay | Admitting: Internal Medicine

## 2023-10-06 MED ORDER — ONDANSETRON HCL 4 MG PO TABS
4.0000 mg | ORAL_TABLET | Freq: Three times a day (TID) | ORAL | 1 refills | Status: DC | PRN
  Filled 2023-10-06: qty 40, 14d supply, fill #0
  Filled 2023-11-29: qty 40, 14d supply, fill #1

## 2023-10-14 ENCOUNTER — Other Ambulatory Visit (HOSPITAL_COMMUNITY): Payer: Self-pay

## 2023-10-17 ENCOUNTER — Encounter: Payer: Self-pay | Admitting: Internal Medicine

## 2023-10-17 ENCOUNTER — Other Ambulatory Visit (HOSPITAL_COMMUNITY): Payer: Self-pay

## 2023-10-17 ENCOUNTER — Other Ambulatory Visit: Payer: Self-pay

## 2023-10-17 ENCOUNTER — Ambulatory Visit (INDEPENDENT_AMBULATORY_CARE_PROVIDER_SITE_OTHER): Payer: Medicare HMO | Admitting: Internal Medicine

## 2023-10-17 ENCOUNTER — Other Ambulatory Visit: Payer: Self-pay | Admitting: Internal Medicine

## 2023-10-17 VITALS — BP 122/78 | HR 92 | Temp 98.8°F | Ht 61.0 in | Wt 153.0 lb

## 2023-10-17 DIAGNOSIS — Z0001 Encounter for general adult medical examination with abnormal findings: Secondary | ICD-10-CM

## 2023-10-17 DIAGNOSIS — E1165 Type 2 diabetes mellitus with hyperglycemia: Secondary | ICD-10-CM | POA: Diagnosis not present

## 2023-10-17 DIAGNOSIS — E559 Vitamin D deficiency, unspecified: Secondary | ICD-10-CM

## 2023-10-17 DIAGNOSIS — E782 Mixed hyperlipidemia: Secondary | ICD-10-CM

## 2023-10-17 DIAGNOSIS — R296 Repeated falls: Secondary | ICD-10-CM | POA: Diagnosis not present

## 2023-10-17 DIAGNOSIS — Z23 Encounter for immunization: Secondary | ICD-10-CM | POA: Diagnosis not present

## 2023-10-17 DIAGNOSIS — J309 Allergic rhinitis, unspecified: Secondary | ICD-10-CM | POA: Diagnosis not present

## 2023-10-17 DIAGNOSIS — M810 Age-related osteoporosis without current pathological fracture: Secondary | ICD-10-CM | POA: Diagnosis not present

## 2023-10-17 DIAGNOSIS — E538 Deficiency of other specified B group vitamins: Secondary | ICD-10-CM

## 2023-10-17 DIAGNOSIS — Z794 Long term (current) use of insulin: Secondary | ICD-10-CM

## 2023-10-17 LAB — VITAMIN D 25 HYDROXY (VIT D DEFICIENCY, FRACTURES): VITD: 47.55 ng/mL (ref 30.00–100.00)

## 2023-10-17 LAB — BASIC METABOLIC PANEL WITH GFR
BUN: 12 mg/dL (ref 6–23)
CO2: 26 meq/L (ref 19–32)
Calcium: 9.2 mg/dL (ref 8.4–10.5)
Chloride: 102 meq/L (ref 96–112)
Creatinine, Ser: 0.68 mg/dL (ref 0.40–1.20)
GFR: 95.34 mL/min (ref >60.00)
Glucose, Bld: 248 mg/dL — ABNORMAL HIGH (ref 70–99)
Potassium: 4.1 meq/L (ref 3.5–5.1)
Sodium: 138 meq/L (ref 135–145)

## 2023-10-17 LAB — CBC WITH DIFFERENTIAL/PLATELET
Basophils Absolute: 0.1 10*3/uL (ref 0.0–0.1)
Basophils Relative: 0.9 % (ref 0.0–3.0)
Eosinophils Absolute: 0.1 10*3/uL (ref 0.0–0.7)
Eosinophils Relative: 1.4 % (ref 0.0–5.0)
HCT: 37.1 % (ref 36.0–46.0)
Hemoglobin: 12 g/dL (ref 12.0–15.0)
Lymphocytes Relative: 48.2 % — ABNORMAL HIGH (ref 12.0–46.0)
Lymphs Abs: 3.9 10*3/uL (ref 0.7–4.0)
MCHC: 32.2 g/dL (ref 30.0–36.0)
MCV: 83.7 fl (ref 78.0–100.0)
Monocytes Absolute: 0.6 10*3/uL (ref 0.1–1.0)
Monocytes Relative: 7.4 % (ref 3.0–12.0)
Neutro Abs: 3.4 10*3/uL (ref 1.4–7.7)
Neutrophils Relative %: 42.1 % — ABNORMAL LOW (ref 43.0–77.0)
Platelets: 393 10*3/uL (ref 150.0–400.0)
RBC: 4.43 Mil/uL (ref 3.87–5.11)
RDW: 14.3 % (ref 11.5–15.5)
WBC: 8.2 10*3/uL (ref 4.0–10.5)

## 2023-10-17 LAB — HEPATIC FUNCTION PANEL
ALT: 12 U/L (ref 0–35)
AST: 12 U/L (ref 0–37)
Albumin: 4.4 g/dL (ref 3.5–5.2)
Alkaline Phosphatase: 72 U/L (ref 39–117)
Bilirubin, Direct: 0 mg/dL (ref 0.0–0.3)
Total Bilirubin: 0.5 mg/dL (ref 0.2–1.2)
Total Protein: 7.2 g/dL (ref 6.0–8.3)

## 2023-10-17 LAB — VITAMIN B12: Vitamin B-12: 680 pg/mL (ref 211–911)

## 2023-10-17 LAB — LIPID PANEL
Cholesterol: 203 mg/dL — ABNORMAL HIGH (ref 0–200)
HDL: 57.7 mg/dL (ref >39.00)
LDL Cholesterol: 115 mg/dL — ABNORMAL HIGH (ref 0–99)
NonHDL: 144.94
Total CHOL/HDL Ratio: 4
Triglycerides: 150 mg/dL — ABNORMAL HIGH (ref 0.0–149.0)
VLDL: 30 mg/dL (ref 0.0–40.0)

## 2023-10-17 LAB — HEMOGLOBIN A1C: Hgb A1c MFr Bld: 9.4 % — ABNORMAL HIGH (ref 4.6–6.5)

## 2023-10-17 LAB — TSH: TSH: 1.55 u[IU]/mL (ref 0.35–5.50)

## 2023-10-17 MED ORDER — MONTELUKAST SODIUM 10 MG PO TABS
10.0000 mg | ORAL_TABLET | Freq: Every day | ORAL | 3 refills | Status: DC
  Filled 2023-10-17: qty 90, 90d supply, fill #0
  Filled 2023-11-29 – 2024-01-18 (×2): qty 90, 90d supply, fill #1
  Filled 2024-04-13: qty 90, 90d supply, fill #2
  Filled 2024-06-07: qty 90, 90d supply, fill #3

## 2023-10-17 MED ORDER — ROSUVASTATIN CALCIUM 40 MG PO TABS
40.0000 mg | ORAL_TABLET | Freq: Every day | ORAL | 3 refills | Status: DC
  Filled 2023-10-17: qty 90, 90d supply, fill #0
  Filled 2023-11-29 – 2024-01-18 (×2): qty 90, 90d supply, fill #1
  Filled 2024-04-20: qty 90, 90d supply, fill #2
  Filled 2024-06-07: qty 90, 90d supply, fill #3

## 2023-10-17 MED ORDER — TIRZEPATIDE 2.5 MG/0.5ML ~~LOC~~ SOAJ
2.5000 mg | SUBCUTANEOUS | 11 refills | Status: DC
  Filled 2023-10-17: qty 2, 28d supply, fill #0
  Filled 2023-11-05: qty 2, 28d supply, fill #1
  Filled 2023-11-29: qty 2, 28d supply, fill #2

## 2023-10-17 NOTE — Assessment & Plan Note (Signed)
 Lab Results  Component Value Date   LDLCALC 115 (H) 10/17/2023   Uncontrolled,, pt to increase crestor  40 mg qd

## 2023-10-17 NOTE — Patient Instructions (Addendum)
 You had the Prevnar 20 pneumonia shot today  Please take all new medication as prescribed - the Prolia   Please take all new medication as prescribed  - the singulair 10 mg per day  Ok to change to ozempic  to mounjaro 2.5 mg, and remember if you do ok, the dose can be increased per Endocrinology (or me ) next month  Please continue all other medications as before, and refills have been done if requested.  Please have the pharmacy call with any other refills you may need.  Please continue your efforts at being more active, low cholesterol diet, and weight control.  You are otherwise up to date with prevention measures today.  Please keep your appointments with your specialists as you may have planned- Dr Festus Hubert and make sure to address the 5 falls in the past 6 months.    Please go to the LAB at the blood drawing area for the tests to be done  You will be contacted by phone if any changes need to be made immediately.  Otherwise, you will receive a letter about your results with an explanation, but please check with MyChart first.  Please make an Appointment to return for your 1 year visit, or sooner if needed

## 2023-10-17 NOTE — Assessment & Plan Note (Signed)
 Etiology unclear, exam benign, no injury, ongoing issue for 10 yrs, does not appear to need PT, has neuro f/u planned next month

## 2023-10-17 NOTE — Assessment & Plan Note (Signed)
 Age and sex appropriate education and counseling updated with regular exercise and diet Referrals for preventative services - none needed Immunizations addressed - for prevnar 20 Smoking counseling  - none needed Evidence for depression or other mood disorder - none significant Most recent labs reviewed. I have personally reviewed and have noted: 1) the patient's medical and social history 2) The patient's current medications and supplements 3) The patient's height, weight, and BMI have been recorded in the chart

## 2023-10-17 NOTE — Assessment & Plan Note (Signed)
 Pt to start prolia

## 2023-10-17 NOTE — Progress Notes (Unsigned)
 [facility] [address] [place] [address] Phone: [phone] Subjective:   IBryan Caprio, am serving as a scribe for Dr. Ronnell Coins.  I'm seeing this patient by the request  of:  Roslyn Coombe, MD  CC: Back and neck pain with follow-up  [id number]  Rachel [name] is a 58 y.o. female coming in with complaint of back and neck pain. OMT 08/31/2023. Patient states same per usual. No new symptoms or concerns. Low back in some discomfort.  Medications patient has been prescribed: None  Taking:         Reviewed prior external information including notes and imaging from previsou exam, outside providers and external EMR if available.   As well as notes that were available from care everywhere and other healthcare systems.  Past medical history, social, surgical and family history all reviewed in electronic medical record.  No pertanent information unless stated regarding to the chief complaint.   Past Medical History:  Diagnosis Date   ALLERGIC RHINITIS 10/04/2007   Anemia 01/21/2011   Anxiety 11/15/2018   ASTHMA 08/02/2007   INHALERS ONLY IN [place]   Asthma 08/02/2007   Qualifier: Diagnosis of  By: Marthe Slain    Blood transfusion without reported diagnosis    with first child    Cervical radiculopathy 01/21/2011   Cervicogenic headache 04/27/2017   COMMON MIGRAINE 05/15/2009   Depression 01/09/2010   Qualifier: Diagnosis of  By: Autry Legions MD, Alveda Aures    DIABETES MELLITUS, TYPE II 08/02/2007   Dysphagia, pharyngoesophageal phase 06/12/2014   GERD 08/02/2007   HYPERLIPIDEMIA 08/02/2007   Hyperlipidemia 08/02/2007   Qualifier: Diagnosis of  By: Marthe Slain    INSOMNIA-SLEEP DISORDER-UNSPEC 01/04/2008   INTERMITTENT VERTIGO 05/15/2009   LIBIDO, DECREASED 01/09/2010   Migraine without aura 05/15/2009   Qualifier: Diagnosis of  By: Autry Legions MD, Alveda Aures    Nonallopathic lesion of rib cage 12/21/2017   Nonallopathic lesion of sacral region  07/29/2015   Nonallopathic lesion of thoracic region 07/29/2015   Osteoporosis 04/25/2019   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   Rheumatoid factor positive 02/10/2016   Right knee pain 01/31/2019   Injected January 31, 2019   Type 2 diabetes mellitus with hyperglycemia, with long-term current use of insulin  (HCC) 08/02/2007   Qualifier: Diagnosis of  By: Marthe Slain     Allergies  Allergen Reactions   Lipitor [Atorvastatin]     REACTION: myalgias   Nitroglycerin      Review of Systems:  No headache, visual changes, nausea, vomiting, diarrhea, constipation, dizziness, abdominal pain, skin rash, fevers, chills, night sweats, weight loss, swollen lymph nodes, body aches, joint swelling, chest pain, shortness of breath, mood changes. POSITIVE muscle aches  Objective  Blood pressure 118/76, pulse (!) 106, height 5\' 1"  (1.549 m), weight 153 lb (69.4 kg), SpO2 98%.   General: No apparent distress alert and oriented x3 mood and affect normal, dressed appropriately.  HEENT: Pupils equal, extraocular movements intact  Respiratory: Patient's speak in full sentences and does not appear short of breath  Cardiovascular: No lower extremity edema, non tender, no erythema  Gait MSK:  Back significant tenderness still noted in the parascapular area right greater than left.  Does have tightness of the neck with sidebending.  In the low back does have a positive Veldon German more on right greater than left.  Negative straight leg test noted today.  Osteopathic findings  C3 flexed rotated  and side bent right C4 flexed rotated and side bent left T4 extended rotated and side bent right inhaled rib T5 extended rotated and side bent left L2 flexed rotated and side bent right Sacrum right on right       Assessment and Plan:  No problem-specific Assessment & Plan notes found for this encounter.    Nonallopathic problems  Decision today to treat with OMT was based on Physical  Exam  After verbal consent patient was treated with HVLA, ME, FPR techniques in cervical, rib, thoracic, lumbar, and sacral  areas  Patient tolerated the procedure well with improvement in symptoms  Patient given exercises, stretches and lifestyle modifications  See medications in patient instructions if given  Patient will follow up in 4-8 weeks     The above documentation has been reviewed and is accurate and complete Rachel [name] M Kynzlie Hilleary, DO         Note: This dictation was prepared with Dragon dictation along with smaller phrase technology. Any transcriptional errors that result from this process are unintentional.

## 2023-10-17 NOTE — Assessment & Plan Note (Signed)
 Mild to mod, for add singulair 10 mg every day,,  to f/u any worsening symptoms or concerns

## 2023-10-17 NOTE — Assessment & Plan Note (Signed)
 Lab Results  Component Value Date   HGBA1C 9.4 (H) 10/17/2023   Uncontrolled, not taking ozemipc, ok for change to mounjaro 2.5 mg with intent to titrate,  , pt to continue current medical treatment glucotrol  xl 2.5 mg, toujeo  30 u every day, f/u endo as planned

## 2023-10-17 NOTE — Progress Notes (Signed)
 Patient ID: Rachel [name], female   DOB: 1965/12/01, 58 y.o.   MRN: [id number]         Chief Complaint:: wellness exam and allergic rhinitis, osteoporosis, recurrent falls, dm       HPI:  Rachel [name] is a 58 y.o. female here for wellness exam; for prevnar 20, o/w up to date                        Also taking claritin 10 mg and nasacort  and starting to work ok for allergy symptoms - Does have several wks ongoing nasal allergy symptoms with clearish congestion, itch and sneezing, without fever, pain, ST, cough, swelling or wheezing.  Was not contacted apparently to start the prolia at last visit, will need to try this again.  Pt has been mostly holding on taking the ozempic  as seems to cause HA, constipation and reflux worse. Ok with trying to change to [facility].  Also has recent rash of recurrent falls 5 times in past 6 months; more than usual, has been a chronic recurring issue for over 10 yrs.  Pt denies polydipsia, polyuria, or new focal neuro s/s.    Pt denies fever, wt loss, night sweats, loss of appetite, or other constitutional symptoms, in fact has gained several lbs recently with less active.     Wt Readings from Last 3 Encounters:  10/17/23 153 lb (69.4 kg)  09/16/23 140 lb (63.5 kg)  08/31/23 138 lb (62.6 kg)   BP Readings from Last 3 Encounters:  10/17/23 122/78  08/31/23 104/80  07/13/23 104/60   Immunization History  Administered Date(s) Administered   H1N1 04/15/2008   Influenza Whole 04/22/2008, 03/14/2010   Influenza,inj,Quad PF,6+ Mos 03/13/2014, 02/28/2019, 02/26/2020   Influenza-Unspecified 03/09/2017, 02/14/2018   Moderna Sars-Covid-2 Vaccination 04/25/2020, 06/03/2020   PNEUMOCOCCAL CONJUGATE-20 10/17/2023   Pneumococcal Conjugate-13 07/09/2015   Pneumococcal Polysaccharide-23 07/08/2008, 06/25/2016   Td 10/03/2006   Tdap 06/25/2016   Zoster Recombinant(Shingrix ) 04/19/2023, 08/01/2023   Health Maintenance Due  Topic Date Due   Diabetic kidney  evaluation - Urine ACR  10/18/2023      Past Medical History:  Diagnosis Date   ALLERGIC RHINITIS 10/04/2007   Anemia 01/21/2011   Anxiety 11/15/2018   ASTHMA 08/02/2007   INHALERS ONLY IN [place]   Asthma 08/02/2007   Qualifier: Diagnosis of  By: Marthe Slain    Blood transfusion without reported diagnosis    with first child    Cervical radiculopathy 01/21/2011   Cervicogenic headache 04/27/2017   COMMON MIGRAINE 05/15/2009   Depression 01/09/2010   Qualifier: Diagnosis of  By: Autry Legions MD, Alveda Aures    DIABETES MELLITUS, TYPE II 08/02/2007   Dysphagia, pharyngoesophageal phase 06/12/2014   GERD 08/02/2007   HYPERLIPIDEMIA 08/02/2007   Hyperlipidemia 08/02/2007   Qualifier: Diagnosis of  By: Marthe Slain    INSOMNIA-SLEEP DISORDER-UNSPEC 01/04/2008   INTERMITTENT VERTIGO 05/15/2009   LIBIDO, DECREASED 01/09/2010   Migraine without aura 05/15/2009   Qualifier: Diagnosis of  By: Autry Legions MD, Alveda Aures    Nonallopathic lesion of rib cage 12/21/2017   Nonallopathic lesion of sacral region 07/29/2015   Nonallopathic lesion of thoracic region 07/29/2015   Osteoporosis 04/25/2019   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   Rheumatoid factor positive 02/10/2016   Right knee pain 01/31/2019   Injected January 31, 2019   Type 2 diabetes mellitus with hyperglycemia, with long-term current use of insulin  (HCC) 08/02/2007   Qualifier:  Diagnosis of  By: Marthe Slain    Past Surgical History:  Procedure Laterality Date   CESAREAN SECTION     x 3   CYST EXCISION     left knee   PAROTIDECTOMY  10/21/2011   Procedure: PAROTIDECTOMY;  Surgeon: Virgina Grills, MD;  Location: [facility] OR;  Service: ENT;  Laterality: Left;    reports that she has never smoked. She has never been exposed to tobacco smoke. She has never used smokeless tobacco. She reports that she does not drink alcohol and does not use drugs. family history includes Asthma in her father; Cervical cancer in her maternal aunt; Diabetes in her  father; Heart disease in her maternal aunt; Hypertension in her father. Allergies  Allergen Reactions   Lipitor [Atorvastatin]     REACTION: myalgias   Nitroglycerin    Current Outpatient Medications on File Prior to Visit  Medication Sig Dispense Refill   Accu-Chek FastClix Lancets MISC Check blood glucose twice daily 204 each 3   aspirin  81 MG EC tablet Take 81 mg by mouth daily.     Blood Glucose Monitoring Suppl (ACCU-CHEK GUIDE) w/Device KIT Use as instructed to check blood sugar 2 times daily 1 kit 0   botulinum toxin Type A  (BOTOX ) 200 units injection Inject 155 units IM Into multiple sites in the face,neck,and head every 90 days 1 each 4   butalbital -acetaminophen -caffeine  (FIORICET ) 50-325-40 MG tablet Take 1 tablet by mouth every 6 (six) hours as needed for headache. 30 tablet 3   carbamazepine  (TEGRETOL ) 200 MG tablet TAKE 1 TABLET TWICE DAILY FOR ONE WEEK, THEN INCREASE TO 1 TABLET THREE TIMES DAILY. 90 tablet 3   Cholecalciferol  (VITAMIN D3) 50 MCG (2000 UT) capsule Take 2 capsules (4,000 Units total) by mouth daily. 180 capsule 3   Continuous Glucose Sensor (DEXCOM G7 SENSOR) MISC Apply 1 sensor every 10 days 9 each 4   cycloSPORINE  (RESTASIS ) 0.05 % ophthalmic emulsion Instill 1 drop into both eyes twice a day 180 each 3   cycloSPORINE  (RESTASIS ) 0.05 % ophthalmic emulsion Place 1 drop into both eyes 2 (two) times daily. 60 each 6   Diclofenac  Sodium (PENNSAID ) 2 % SOLN Place 2 g onto the skin 2 (two) times daily. 112 g 3   DULoxetine  (CYMBALTA ) 60 MG capsule Take 1 capsule (60 mg total) by mouth daily. 90 capsule 3   esomeprazole  (NEXIUM ) 40 MG capsule Take 1 capsule (40 mg total) by mouth 2 (two) times daily before a meal. 180 capsule 3   fluorometholone  (FML) 0.1 % ophthalmic suspension Place 1 drop into both eyes 4 (four) times daily. 5 mL 0   Fluticasone -Umeclidin-Vilant (TRELEGY ELLIPTA ) 100-62.5-25 MCG/ACT AEPB Inhale 1 puff into the lungs daily. 60 each 11   gabapentin   (NEURONTIN ) 300 MG capsule Take 2 capsules (600 mg total) by mouth 2 (two) times daily. 120 capsule 3   glipiZIDE  (GLUCOTROL  XL) 2.5 MG 24 hr tablet Take 1 tablet (2.5 mg total) by mouth daily with breakfast. 90 tablet 3   glucose blood (ACCU-CHEK GUIDE) test strip Use to check blood sugar 2 (two) times daily as directed 200 each 3   ibuprofen  (ADVIL ) 800 MG tablet Take 1 tablet (800 mg total) by mouth every 6 (six) hours as needed for pain. 16 tablet 0   insulin  glargine, 1 Unit Dial , (TOUJEO  SOLOSTAR) 300 UNIT/ML Solostar Pen Inject 30 Units into the skin daily. 30 mL 2   Lifitegrast  (XIIDRA ) 5 % SOLN Place 1 drop  into both eyes 2 (two) times daily. 60 each 6   linaclotide  (LINZESS ) 145 MCG CAPS capsule Take 1 capsule (145 mcg total) by mouth daily before breakfast 30 capsule 2   loteprednol  (LOTEMAX ) 0.5 % ophthalmic suspension Place 1 drop into both eyes 4 (four) times daily for 7 days, THEN 1 drop 3 (three) times daily for 7 days, THEN 1 drop 2 (two) times daily for 14 days. 5 mL 2   metoCLOPramide  (REGLAN ) 5 MG tablet Take 1 tablet (5 mg total) by mouth 3 (three) times daily 30 minutes before meals 90 tablet 1   Multiple Vitamin (MULTIVITAMIN WITH MINERALS) TABS tablet Take 1 tablet by mouth daily.     neomycin -polymyxin-hydrocortisone (CORTISPORIN ) OTIC solution Place 4 drops into the left ear 4 (four) times daily. 10 mL 0   nystatin -triamcinolone  ointment (MYCOLOG) Apply 1 Application topically 2 (two) times daily to affected area(s). 30 g 11   ondansetron  (ZOFRAN  ODT) 8 MG disintegrating tablet Take 1 tablet (8 mg total) by mouth every 8 (eight) hours as needed for nausea or vomiting. 30 tablet 0   ondansetron  (ZOFRAN ) 4 MG tablet Take 1 tablet (4 mg total) by mouth every 8 (eight) hours as needed for nausea and vomiting 40 tablet 1   prednisoLONE  acetate (PRED FORTE ) 1 % ophthalmic suspension Place 1 drop into both eyes 4 (four) times daily. 5 mL 0   predniSONE  (DELTASONE ) 10 MG tablet take  3 tabs by mouth a day for 3 days, then 2 tabs a day for 3 days, then 1 tab per day for 3 days 18 tablet 0   propranolol  (INDERAL ) 20 MG tablet Take 1.5 tablets (30 mg total) by mouth at bedtime. 90 tablet 1   Semaglutide ,0.25 or 0.5MG /DOS, (OZEMPIC , 0.25 OR 0.5 MG/DOSE,) 2 MG/3ML SOPN Inject 0.5 mg into the skin once a week. 9 mL 3   tiZANidine  (ZANAFLEX ) 4 MG tablet Take 1 tablet (4 mg total) by mouth every 8 (eight) hours as needed for muscle spasms. 90 tablet 5   triamcinolone  (NASACORT ) 55 MCG/ACT AERO nasal inhaler Place 2 sprays into the nose daily. 16.9 mL 12   Ubrogepant  (UBRELVY ) 100 MG TABS Take 1 tablet (100 mg total) by mouth as needed. May repeat after 2 hours.  Maximum 2 tablets in 24 hours. 10 tablet 11   No current facility-administered medications on file prior to visit.        ROS:  All others reviewed and negative.  Objective        PE:  BP 122/78 (BP Location: Right Arm, Patient Position: Sitting, Cuff Size: Normal)   Pulse 92   Temp 98.8 F (37.1 C) (Oral)   Ht 5\' 1"  (1.549 m)   Wt 153 lb (69.4 kg)   SpO2 98%   BMI 28.91 kg/m                 Constitutional: Pt appears in NAD               HENT: Head: NCAT.                Right Ear: External ear normal.                 Left Ear: External ear normal.                Eyes: . Pupils are equal, round, and reactive to light. Conjunctivae and EOM are normal  Nose: without d/c or deformity               Neck: Neck supple. Gross normal ROM               Cardiovascular: Normal rate and regular rhythm.                 Pulmonary/Chest: Effort normal and breath sounds without rales or wheezing.                Abd:  Soft, NT, ND, + BS, no organomegaly               Neurological: Pt is alert. At baseline orientation, motor grossly intact               Skin: Skin is warm. No rashes, no other new lesions, LE edema - none               Psychiatric: Pt behavior is normal without agitation   Micro: none  Cardiac  tracings I have personally interpreted today:  none  Pertinent Radiological findings (summarize): none   Lab Results  Component Value Date   WBC 8.2 10/17/2023   HGB 12.0 10/17/2023   HCT 37.1 10/17/2023   PLT 393.0 10/17/2023   GLUCOSE 248 (H) 10/17/2023   CHOL 203 (H) 10/17/2023   TRIG 150.0 (H) 10/17/2023   HDL 57.70 10/17/2023   LDLDIRECT 170.0 04/07/2022   LDLCALC 115 (H) 10/17/2023   ALT 12 10/17/2023   AST 12 10/17/2023   NA 138 10/17/2023   K 4.1 10/17/2023   CL 102 10/17/2023   CREATININE 0.68 10/17/2023   BUN 12 10/17/2023   CO2 26 10/17/2023   TSH 1.55 10/17/2023   INR 1.08 07/23/2009   HGBA1C 9.4 (H) 10/17/2023   MICROALBUR 1.5 10/18/2022   Assessment/Plan:  Rachel [name] is a 58 y.o. Other or two or more races [6] female with  has a past medical history of ALLERGIC RHINITIS (10/04/2007), Anemia (01/21/2011), Anxiety (11/15/2018), ASTHMA (08/02/2007), Asthma (08/02/2007), Blood transfusion without reported diagnosis, Cervical radiculopathy (01/21/2011), Cervicogenic headache (04/27/2017), COMMON MIGRAINE (05/15/2009), Depression (01/09/2010), DIABETES MELLITUS, TYPE II (08/02/2007), Dysphagia, pharyngoesophageal phase (06/12/2014), GERD (08/02/2007), HYPERLIPIDEMIA (08/02/2007), Hyperlipidemia (08/02/2007), INSOMNIA-SLEEP DISORDER-UNSPEC (01/04/2008), INTERMITTENT VERTIGO (05/15/2009), LIBIDO, DECREASED (01/09/2010), Migraine without aura (05/15/2009), Nonallopathic lesion of rib cage (12/21/2017), Nonallopathic lesion of sacral region (07/29/2015), Nonallopathic lesion of thoracic region (07/29/2015), Osteoporosis (04/25/2019), Patellofemoral syndrome of both knees (04/25/2019), Rheumatoid factor positive (02/10/2016), Right knee pain (01/31/2019), and Type 2 diabetes mellitus with hyperglycemia, with long-term current use of insulin  (HCC) (08/02/2007).  Encounter for well adult exam with abnormal findings Age and sex appropriate education and counseling updated with regular exercise and  diet Referrals for preventative services - none needed Immunizations addressed - for prevnar 20 Smoking counseling  - none needed Evidence for depression or other mood disorder - none significant Most recent labs reviewed. I have personally reviewed and have noted: 1) the patient's medical and social history 2) The patient's current medications and supplements 3) The patient's height, weight, and BMI have been recorded in the chart   Type 2 diabetes mellitus with hyperglycemia, with long-term current use of insulin  (HCC) Lab Results  Component Value Date   HGBA1C 9.4 (H) 10/17/2023   Uncontrolled, not taking ozemipc, ok for change to mounjaro 2.5 mg with intent to titrate,  , pt to continue current medical treatment glucotrol  xl 2.5 mg, toujeo  30 u every day, f/u endo as planned   Hyperlipidemia  Lab Results  Component Value Date   LDLCALC 115 (H) 10/17/2023   Uncontrolled,, pt to increase crestor  40 mg qd   Allergic rhinitis Mild to mod, for add singulair 10 mg every day,,  to f/u any worsening symptoms or concerns  Osteoporosis Pt to start prolia  Recurrent falls Etiology unclear, exam benign, no injury, ongoing issue for 10 yrs, does not appear to need PT, has neuro f/u planned next month  Followup: No follow-ups on file.  Rosalia Colonel, MD 10/17/2023 8:39 PM [facility]

## 2023-10-18 ENCOUNTER — Other Ambulatory Visit: Payer: Self-pay | Admitting: Internal Medicine

## 2023-10-18 DIAGNOSIS — Z1231 Encounter for screening mammogram for malignant neoplasm of breast: Secondary | ICD-10-CM

## 2023-10-19 ENCOUNTER — Ambulatory Visit: Admitting: Family Medicine

## 2023-10-19 ENCOUNTER — Encounter: Payer: Self-pay | Admitting: Family Medicine

## 2023-10-19 VITALS — BP 118/76 | HR 106 | Ht 61.0 in | Wt 153.0 lb

## 2023-10-19 DIAGNOSIS — M9903 Segmental and somatic dysfunction of lumbar region: Secondary | ICD-10-CM

## 2023-10-19 DIAGNOSIS — G4486 Cervicogenic headache: Secondary | ICD-10-CM

## 2023-10-19 DIAGNOSIS — M9904 Segmental and somatic dysfunction of sacral region: Secondary | ICD-10-CM

## 2023-10-19 DIAGNOSIS — M9901 Segmental and somatic dysfunction of cervical region: Secondary | ICD-10-CM | POA: Diagnosis not present

## 2023-10-19 DIAGNOSIS — M9908 Segmental and somatic dysfunction of rib cage: Secondary | ICD-10-CM

## 2023-10-19 DIAGNOSIS — M9902 Segmental and somatic dysfunction of thoracic region: Secondary | ICD-10-CM

## 2023-10-19 NOTE — Assessment & Plan Note (Signed)
 Does have arthritic changes of multiple joints.  Does seem to have more of a fibromyalgia or chronic pain also contributing.  Continue on the Cymbalta .  Discussed continuing to stay active with the patient has been actually doing relatively well.  Need to continue to monitor weight with patient having some weight gain recently but seems to be more fluid related.  Discussed icing regimen of home exercises, increase activity slowly otherwise.  Follow-up again in 6 to 8 weeks otherwise.

## 2023-10-19 NOTE — Patient Instructions (Signed)
 Good to see you! See you again in 2 months

## 2023-10-20 ENCOUNTER — Other Ambulatory Visit: Payer: Self-pay | Admitting: Internal Medicine

## 2023-10-20 ENCOUNTER — Ambulatory Visit: Payer: 59 | Admitting: Neurology

## 2023-10-20 DIAGNOSIS — E1165 Type 2 diabetes mellitus with hyperglycemia: Secondary | ICD-10-CM

## 2023-10-25 ENCOUNTER — Other Ambulatory Visit (HOSPITAL_COMMUNITY): Payer: Self-pay

## 2023-10-31 ENCOUNTER — Other Ambulatory Visit: Payer: Self-pay

## 2023-10-31 ENCOUNTER — Telehealth: Payer: Self-pay

## 2023-10-31 ENCOUNTER — Other Ambulatory Visit (HOSPITAL_COMMUNITY): Payer: Self-pay

## 2023-10-31 DIAGNOSIS — M810 Age-related osteoporosis without current pathological fracture: Secondary | ICD-10-CM

## 2023-10-31 MED ORDER — DENOSUMAB 60 MG/ML ~~LOC~~ SOSY
60.0000 mg | PREFILLED_SYRINGE | Freq: Once | SUBCUTANEOUS | Status: AC
Start: 2023-11-14 — End: ?

## 2023-10-31 NOTE — Telephone Encounter (Signed)
 Prolia  VOB initiated via [email or web address]  Next Prolia  inj DUE: NEW START   Pharmacy benefit: $0

## 2023-11-02 ENCOUNTER — Other Ambulatory Visit: Payer: Self-pay

## 2023-11-03 NOTE — Telephone Encounter (Signed)
 Rachel [name]

## 2023-11-03 NOTE — Telephone Encounter (Signed)
 Prior Authorization form/request asks a question that requires your assistance. Please see the question below and advise accordingly. The PA will not be submitted until the necessary information is received.      Insurance requires failure or contraindication to both an oral and IV bisphosphate for buy and bill. Patient can receive Prolia  through pharmacy benefit without PA. Patient's copay is $0.

## 2023-11-03 NOTE — Telephone Encounter (Signed)
 Pt ready for scheduling for PROLIA on or after : 11/03/23  ----------------------------------------------------------------------- Option# 2- Med Obtained from pharmacy:  Pharmacy benefit: Copay $0 (Paid to pharmacy) Admin Fee: 20% - approximately $25 (Pay at clinic)  Prior Auth: N/A PA# Expiration Date:   # of doses approved:   If patient wants fill through the pharmacy benefit please send prescription to: [facility], and include estimated need by date in rx notes. Pharmacy will ship medication directly to the office.  Patient NOT eligible for Prolia Copay Card. Copay Card can make patient's cost as little as $25. Link to apply: [email or web address]://[email or web address]  ** This summary of benefits is an estimation of the patient's out-of-pocket cost. Exact cost may very based on individual plan coverage.

## 2023-11-06 ENCOUNTER — Other Ambulatory Visit: Payer: Self-pay

## 2023-11-14 ENCOUNTER — Other Ambulatory Visit (HOSPITAL_COMMUNITY): Payer: Self-pay

## 2023-11-17 ENCOUNTER — Telehealth: Payer: Self-pay

## 2023-11-17 NOTE — Telephone Encounter (Signed)
 PA needed Botox 

## 2023-11-21 ENCOUNTER — Ambulatory Visit

## 2023-11-21 ENCOUNTER — Telehealth: Payer: Self-pay | Admitting: Pharmacy Technician

## 2023-11-21 NOTE — Telephone Encounter (Signed)
 PA has been submitted, and telephone encounter has been created. Please see telephone encounter dated 6.9.25.

## 2023-11-21 NOTE — Telephone Encounter (Signed)
 Pharmacy Patient Advocate Encounter  Pharmacy Benefit PA has been submitted for Botox - [id number] via CoverMyMeds.  INSURANCE: [facility] KEY/EOC/FAX: [id number] Procedure code 78469  Does Not require a PA Status is Pending

## 2023-11-23 ENCOUNTER — Other Ambulatory Visit: Payer: Self-pay

## 2023-11-23 NOTE — Progress Notes (Signed)
 Specialty Pharmacy Refill Coordination Note  Rachel [name] is a 58 y.o. female assessed today regarding refills of clinic administered specialty medication(s) OnabotulinumtoxinA  (Botox )   Clinic requested Courier to Provider Office   Delivery date: 11/24/23  Injection date: 12/02/23   Verified address: [facility] [address] [place] [address]   Medication will be filled on 11/23/23.

## 2023-11-29 ENCOUNTER — Other Ambulatory Visit: Payer: Self-pay | Admitting: Internal Medicine

## 2023-11-29 ENCOUNTER — Other Ambulatory Visit (HOSPITAL_COMMUNITY): Payer: Self-pay

## 2023-11-29 ENCOUNTER — Other Ambulatory Visit: Payer: Self-pay

## 2023-11-29 MED ORDER — LINACLOTIDE 145 MCG PO CAPS
145.0000 ug | ORAL_CAPSULE | Freq: Every day | ORAL | 2 refills | Status: DC
  Filled 2023-11-29: qty 30, 30d supply, fill #0
  Filled 2024-01-27: qty 30, 30d supply, fill #1
  Filled 2024-03-18: qty 30, 30d supply, fill #2

## 2023-11-29 MED ORDER — NEOMYCIN-POLYMYXIN-HC 3.5-10000-1 OT SOLN
4.0000 [drp] | Freq: Four times a day (QID) | OTIC | 0 refills | Status: AC
Start: 2023-11-29 — End: ?
  Filled 2023-11-29: qty 10, 13d supply, fill #0

## 2023-11-29 NOTE — Telephone Encounter (Signed)
 Pharmacy Patient Advocate Encounter  Received notification from [facility] Medicare Part D that Prior Authorization for Botox  200UNIT solution has been APPROVED from 11-21-2023 to 02-21-2024   PA #/Case ID/Reference #: [id number]

## 2023-11-30 NOTE — Progress Notes (Signed)
 [name] [address] [place] [address] Phone: [phone] Subjective:   Rachel [name], am serving as a scribe for Dr. Arthea Claudene.  I'm seeing this patient by the request  of:  [name], MD  CC: Back and neck pain follow-up  YEP:Dlagzrupcz  Rachel [name] is a 58 y.o. female coming in with complaint of back and neck pain. OMT 10/19/2023. Patient states that B knees are bothering her.   Neck and back are the same.   Medications patient has been prescribed: None  Taking:         Reviewed prior external information including notes and imaging from previsou exam, outside providers and external EMR if available.   As well as notes that were available from care everywhere and other healthcare systems.  Past medical history, social, surgical and family history all reviewed in electronic medical record.  No pertanent information unless stated regarding to the chief complaint.   Past Medical History:  Diagnosis Date   ALLERGIC RHINITIS 10/04/2007   Anemia 01/21/2011   Anxiety 11/15/2018   ASTHMA 08/02/2007   INHALERS ONLY IN [place]   Asthma 08/02/2007   Qualifier: Diagnosis of  By: [name]    Blood transfusion without reported diagnosis    with first child    Cervical radiculopathy 01/21/2011   Cervicogenic headache 04/27/2017   COMMON MIGRAINE 05/15/2009   Depression 01/09/2010   Qualifier: Diagnosis of  By: [name], [name]    DIABETES MELLITUS, TYPE II 08/02/2007   Dysphagia, pharyngoesophageal phase 06/12/2014   GERD 08/02/2007   HYPERLIPIDEMIA 08/02/2007   Hyperlipidemia 08/02/2007   Qualifier: Diagnosis of  By: [name]    INSOMNIA-SLEEP DISORDER-UNSPEC 01/04/2008   INTERMITTENT VERTIGO 05/15/2009   LIBIDO, DECREASED 01/09/2010   Migraine without aura 05/15/2009   Qualifier: Diagnosis of  By: [name], [name]    Nonallopathic lesion of rib cage 12/21/2017   Nonallopathic lesion of sacral region 07/29/2015    Nonallopathic lesion of thoracic region 07/29/2015   Osteoporosis 04/25/2019   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   Rheumatoid factor positive 02/10/2016   Right knee pain 01/31/2019   Injected January 31, 2019   Type 2 diabetes mellitus with hyperglycemia, with long-term current use of insulin  (HCC) 08/02/2007   Qualifier: Diagnosis of  By: [name]     Allergies  Allergen Reactions   Lipitor [Atorvastatin]     REACTION: myalgias   Nitroglycerin      Review of Systems:  No headache, visual changes, nausea, vomiting, diarrhea, constipation, dizziness, abdominal pain, skin rash, fevers, chills, night sweats, weight loss, swollen lymph nodes, body aches, joint swelling, chest pain, shortness of breath, mood changes. POSITIVE muscle aches  Objective  Blood pressure 120/82, pulse 87, height 5' 1 (1.549 m), weight 154 lb (69.9 kg), SpO2 98%.   General: No apparent distress alert and oriented x3 mood and affect normal, dressed appropriately.  HEENT: Pupils equal, extraocular movements intact  Respiratory: Patient's speak in full sentences and does not appear short of breath  Cardiovascular: No lower extremity edema, non tender, no erythema  Crepitus of the knees bilaterally.  Instability noted with valgus and varus force.  Back does have some loss lordosis noted.  Some tenderness to palpation in the paraspinal musculature.  Foot exam shows breakdown of the transverse arch noted bilaterally.  Mild splaying between the 1st and 2nd toes.  Patient does have some  insufficiency of the posterior tibialis tendon.   Osteopathic findings  C3 flexed rotated and side bent right C7 flexed rotated and side bent left T3 extended rotated and side bent right inhaled rib T8 extended rotated and side bent left L2 flexed rotated and side bent right L3 flexed rotated and side bent left Sacrum right on right  After informed written and verbal consent, patient was seated on exam  table. Right knee was prepped with alcohol swab and utilizing anterolateral approach, patient's right knee space was injected with 4:1  marcaine 0.5%: Kenalog  40mg /dL. Patient tolerated the procedure well without immediate complications.  After informed written and verbal consent, patient was seated on exam table. Left knee was prepped with alcohol swab and utilizing anterolateral approach, patient's left knee space was injected with 4:1  marcaine 0.5%: Kenalog  40mg /dL. Patient tolerated the procedure well without immediate complications.   Assessment and Plan:  Cervicogenic headache Continues to have headaches, continues to have some difficulty.  Is trying to be more active but finding it difficult.  Wants to lose some weight.  Wants to be more active and hopefully the injections will help as well.  Increase activity slowly otherwise.  Follow-up again in 6 to 8 weeks.  Degenerative arthritis of knee, bilateral Significant arthritic changes noted.  Discussed icing regimen and home exercises.  Increase activity slowly otherwise.  Follow-up again in 6 to 8 weeks.    Nonallopathic problems  Decision today to treat with OMT was based on Physical Exam  After verbal consent patient was treated with HVLA, ME, FPR techniques in cervical, rib, thoracic, lumbar, and sacral  areas  Patient tolerated the procedure well with improvement in symptoms  Patient given exercises, stretches and lifestyle modifications  See medications in patient instructions if given  Patient will follow up in 4-8 weeks    The above documentation has been reviewed and is accurate and complete [name], DO          Note: This dictation was prepared with Dragon dictation along with smaller phrase technology. Any transcriptional errors that result from this process are unintentional.

## 2023-12-01 ENCOUNTER — Ambulatory Visit: Admitting: Family Medicine

## 2023-12-01 ENCOUNTER — Encounter: Payer: Self-pay | Admitting: Family Medicine

## 2023-12-01 VITALS — BP 120/82 | HR 87 | Ht 61.0 in | Wt 154.0 lb

## 2023-12-01 DIAGNOSIS — M9904 Segmental and somatic dysfunction of sacral region: Secondary | ICD-10-CM

## 2023-12-01 DIAGNOSIS — M9903 Segmental and somatic dysfunction of lumbar region: Secondary | ICD-10-CM

## 2023-12-01 DIAGNOSIS — M9908 Segmental and somatic dysfunction of rib cage: Secondary | ICD-10-CM | POA: Diagnosis not present

## 2023-12-01 DIAGNOSIS — G4486 Cervicogenic headache: Secondary | ICD-10-CM

## 2023-12-01 DIAGNOSIS — M9902 Segmental and somatic dysfunction of thoracic region: Secondary | ICD-10-CM

## 2023-12-01 DIAGNOSIS — M9901 Segmental and somatic dysfunction of cervical region: Secondary | ICD-10-CM | POA: Diagnosis not present

## 2023-12-01 DIAGNOSIS — M170 Bilateral primary osteoarthritis of knee: Secondary | ICD-10-CM | POA: Diagnosis not present

## 2023-12-01 DIAGNOSIS — M216X9 Other acquired deformities of unspecified foot: Secondary | ICD-10-CM | POA: Diagnosis not present

## 2023-12-01 NOTE — Assessment & Plan Note (Signed)
 Continues to have headaches, continues to have some difficulty.  Is trying to be more active but finding it difficult.  Wants to lose some weight.  Wants to be more active and hopefully the injections will help as well.  Increase activity slowly otherwise.  Follow-up again in 6 to 8 weeks.

## 2023-12-01 NOTE — Patient Instructions (Signed)
 Custom orthotics See me again in 6-8 weeks

## 2023-12-01 NOTE — Assessment & Plan Note (Signed)
 Significant arthritic changes noted.  Discussed icing regimen and home exercises.  Increase activity slowly otherwise.  Follow-up again in 6 to 8 weeks.

## 2023-12-01 NOTE — Assessment & Plan Note (Signed)
 Breakdown of the transverse arch bilaterally.  Patient's has had difficulty finding the right shoes.  Started having increasing discomfort and pain and could be contributing to some of the knee pain patient has.  Discussed which activities to do and which ones to avoid.  Will send patient to sports medicine fellowship for custom orthotics.

## 2023-12-02 ENCOUNTER — Ambulatory Visit
Admission: RE | Admit: 2023-12-02 | Discharge: 2023-12-02 | Disposition: A | Discharge status: Home / Self Care | Source: Ambulatory Visit | Attending: Internal Medicine | Admitting: Internal Medicine

## 2023-12-02 ENCOUNTER — Ambulatory Visit (INDEPENDENT_AMBULATORY_CARE_PROVIDER_SITE_OTHER): Admitting: Neurology

## 2023-12-02 DIAGNOSIS — Z1231 Encounter for screening mammogram for malignant neoplasm of breast: Secondary | ICD-10-CM | POA: Diagnosis not present

## 2023-12-02 DIAGNOSIS — G43709 Chronic migraine without aura, not intractable, without status migrainosus: Secondary | ICD-10-CM

## 2023-12-02 MED ORDER — ONABOTULINUMTOXINA 100 UNITS IJ SOLR
200.0000 [IU] | Freq: Once | INTRAMUSCULAR | Status: AC
  Administered 2023-12-02: 155 [IU] via INTRAMUSCULAR

## 2023-12-02 NOTE — Progress Notes (Signed)

## 2023-12-06 ENCOUNTER — Other Ambulatory Visit (HOSPITAL_COMMUNITY): Payer: Self-pay

## 2023-12-06 ENCOUNTER — Encounter: Payer: Self-pay | Admitting: Internal Medicine

## 2023-12-06 ENCOUNTER — Ambulatory Visit (INDEPENDENT_AMBULATORY_CARE_PROVIDER_SITE_OTHER): Admitting: Internal Medicine

## 2023-12-06 VITALS — BP 112/60 | HR 97 | Ht 61.0 in | Wt 149.6 lb

## 2023-12-06 DIAGNOSIS — E1165 Type 2 diabetes mellitus with hyperglycemia: Secondary | ICD-10-CM | POA: Diagnosis not present

## 2023-12-06 DIAGNOSIS — E663 Overweight: Secondary | ICD-10-CM

## 2023-12-06 DIAGNOSIS — G63 Polyneuropathy in diseases classified elsewhere: Secondary | ICD-10-CM | POA: Diagnosis not present

## 2023-12-06 DIAGNOSIS — Z794 Long term (current) use of insulin: Secondary | ICD-10-CM | POA: Diagnosis not present

## 2023-12-06 DIAGNOSIS — E782 Mixed hyperlipidemia: Secondary | ICD-10-CM | POA: Diagnosis not present

## 2023-12-06 MED ORDER — GLIPIZIDE ER 2.5 MG PO TB24
5.0000 mg | ORAL_TABLET | Freq: Every day | ORAL | 3 refills | Status: AC
  Filled 2023-12-06 – 2024-01-18 (×2): qty 180, 90d supply, fill #0
  Filled 2024-02-11 – 2024-04-04 (×3): qty 180, 90d supply, fill #1
  Filled 2024-06-07: qty 180, 90d supply, fill #2

## 2023-12-06 MED ORDER — TOUJEO SOLOSTAR 300 UNIT/ML ~~LOC~~ SOPN: 36.0000 [IU] | PEN_INJECTOR | Freq: Every day | SUBCUTANEOUS | Status: DC

## 2023-12-06 MED ORDER — FREESTYLE LIBRE 3 PLUS SENSOR MISC
1.0000 | 3 refills | Status: DC
  Filled 2023-12-06 – 2024-01-27 (×3): qty 6, 90d supply, fill #0
  Filled 2024-03-22 – 2024-04-04 (×2): qty 6, 90d supply, fill #1

## 2023-12-06 MED ORDER — TIRZEPATIDE 5 MG/0.5ML ~~LOC~~ SOAJ
5.0000 mg | SUBCUTANEOUS | 3 refills | Status: DC
  Filled 2023-12-06: qty 2, 28d supply, fill #0
  Filled 2024-01-18: qty 2, 28d supply, fill #1
  Filled 2024-02-11: qty 2, 28d supply, fill #2
  Filled 2024-03-18: qty 2, 28d supply, fill #3

## 2023-12-06 MED ORDER — FREESTYLE LIBRE 3 READER DEVI
1.0000 | Freq: Once | 0 refills | Status: AC
  Filled 2023-12-06: qty 1, 90d supply, fill #0

## 2023-12-06 NOTE — Patient Instructions (Addendum)
 Please increase: - Toujeo  36 units in a.m. - Glipizide  ER 5 mg before b'fast - Mounjaro  5 mg weekly  Please return in 3 months with your sugar log.

## 2023-12-06 NOTE — Progress Notes (Signed)
 Subjective:     Patient ID: Rachel [name], female   DOB: 01-14-1966, 58 y.o.   MRN: [id number]  HPI Rachel [name] is a  58 y.o. woman, returning for f/u for DM2, dx 1987, uncontrolled, insulin -dependent, with probable complications (? peripheral neuropathy, ?  Gastroparesis).  Last visit 7 months ago.  Interim history: No increased urination, blurry vision, chest pain.  She walks daily. She is eating healthy. No sweets.  However, sugars are very high. On steroid eye drops - for 2.5 weeks, finished yesterday.  She still has burning in her eyes.  Because of this, PCP changed her Ozempic  to Mounjaro .  She started this 1.5 months ago.  She tolerates it well.  She is at the lowest dose.  Reviewed HbA1c levels: Lab Results  Component Value Date   HGBA1C 9.4 (H) 10/17/2023   HGBA1C 8.4 (A) 04/25/2023   HGBA1C 8.1 12/17/2022   HGBA1C 8.3 (H) 10/18/2022   HGBA1C 8.4 (A) 08/13/2022   HGBA1C 7.7 (H) 04/07/2022   HGBA1C 7.2 (A) 03/09/2022   HGBA1C 7.2 (A) 11/06/2021   HGBA1C 6.8 (A) 08/04/2021   HGBA1C 6.7 (A) 03/30/2021   HGBA1C 8.3 (H) 12/30/2020   HGBA1C 6.6 (A) 09/16/2020   HGBA1C 7.8 (A) 06/17/2020   HGBA1C 10.0 (H) 08/29/2019   HGBA1C 11.1 (A) 07/02/2019   HGBA1C 10.2 (H) 11/14/2018   HGBA1C 11.2 (H) 08/23/2018   HGBA1C 8.6 (A) 04/26/2018   HGBA1C 0 04/26/2018   HGBA1C 0 (A) 04/26/2018   HGBA1C 0.0 04/26/2018   HGBA1C 12.7 (A) 11/23/2017   HGBA1C 9.2 02/17/2017   HGBA1C 9.2 11/11/2016   HGBA1C 11.5 (H) 06/25/2016   HGBA1C 10.9 04/29/2015   HGBA1C 10.8 (H) 01/09/2015   HGBA1C 12.7 (H) 06/24/2014   HGBA1C 15.1 (H) 10/04/2013   HGBA1C 8.4 (H) 01/11/2013  02/17/2017: HbA1c calculated from fructosamine is better, at 8.34%, but still high. Prev. 10.1%.  Reviewed history: She came off all DM medicines for 6 mo before the HbA1c in 09/2013. She again ran out of all meds in 04/2016.  She is  on: - Lantus  40 >> ... Semglee  24 >> 30 >> 24-28 >> Toujeo  24-28 >> 30 units in a.m. -  Ozempic  0.5 mg weekly - added 06/2019 >> 0.25 >> 0.5 >> 0.25 mg weekly (diarrhea, AP >> improved) >> 0.5 mg weekly >> Mounjaro  2.5 mg weekly (started 10/17/2023). - Glipizide  ER 2.5 mg before b'fast - added 04/2023 Stopped Januvia  100 mg in am >> then Ozempic  0.5 mg weekly She is off Metformin  XR 1000 mg 2x a day with b'fast and dinner >> upset stomach >> stopped 04/2019 We tried Trulicity  1.5 mg weekly >> worked great but had to stop b/c GERD >> stopped. We tried Jardiance  25 mg daily >> started 08/2016 >> but stopped recently 2/2 recurrent UTIs She was on Glipizide  5 mg bid - added 04/2015 >> was not taking it b/c low CBGs. We stopped Glipizide  XL 5 mg in 07/2014. She had episodes of hypoglycemia with Amaryl  in the past. She was on Bydureon  2 mg weekly (had nausea). Previously on Humalog , then NovoLog  -started 12/2020  She checks her sugars 2 times a day: - am: 80, 124-138 >> 87-140, 205 >> 60, 109-120, 189 >> 250-260s - after b'fast: 158-251 >> n/c  - before lunch: 75 (fasting, w/o insulin ) >> 99 >> n/c  - after lunch: up to 200s >> n/c >> 215 >> n/c  - before dinner:  180-200 >> 137, 150 >> 140-150  >>  n/c >> 220-230 - after dinner: 166-245 >> n/c - bedtime:  145-150s >> n/c >> 200 >> 103-125, 140 >> n/c She has hypoglycemia awareness in the 90s. Lowest: 38 (while on Trulicity ) >> ...  82 >> 87 >> 60 >> 200 Highest 600 >> ...  200 (candy) >> 205 >> 215 >> 260  She saw nutrition in the past.  -+ HL. Last lipids: Lab Results  Component Value Date   CHOL 203 (H) 10/17/2023   HDL 57.70 10/17/2023   LDLCALC 115 (H) 10/17/2023   LDLDIRECT 170.0 04/07/2022   TRIG 150.0 (H) 10/17/2023   CHOLHDL 4 10/17/2023  On Crestor  40 daily.  -No CKD: Lab Results  Component Value Date   BUN 12 10/17/2023   Lab Results  Component Value Date   CREATININE 0.68 10/17/2023   Lab Results  Component Value Date   MICRALBCREAT 0.8 10/18/2022   MICRALBCREAT 1.0 04/07/2022   MICRALBCREAT 1.2  10/12/2021   MICRALBCREAT 0.8 08/28/2020   MICRALBCREAT 2.0 08/29/2019   MICRALBCREAT 1.4 06/25/2016   MICRALBCREAT 0.6 01/09/2015   MICRALBCREAT 3.4 06/24/2014   MICRALBCREAT 4.9 10/04/2013   MICRALBCREAT 0.3 06/13/2012   -+ Numbness and tingling in the right leg.  Last foot exam 12/17/2022.  - Latest eye appointment was 03/10/2023: No DR.  She had a gastric emptying study (02/03/2021) showed gastroparesis (but checked on Ozempic !). Started on Reglan , but not using it consistently. She has constipation - on Linzess . 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 She was dx'ed with OSA >> on CPAP.  She is on disability.  Review of Systems + see HPI  I reviewed pt's medications, allergies, PMH, social hx, family hx, and changes were documented in the history of present illness. Otherwise, unchanged from my initial visit note.  Past Medical History:  Diagnosis Date   ALLERGIC RHINITIS 10/04/2007   Anemia 01/21/2011   Anxiety 11/15/2018   ASTHMA 08/02/2007   INHALERS ONLY IN [place]   Asthma 08/02/2007   Qualifier: Diagnosis of  By: Rachel [name]    Blood transfusion without reported diagnosis    with first child    Cervical radiculopathy 01/21/2011   Cervicogenic headache 04/27/2017   COMMON MIGRAINE 05/15/2009   Depression 01/09/2010   Qualifier: Diagnosis of  By: [name], [name]    DIABETES MELLITUS, TYPE II 08/02/2007   Dysphagia, pharyngoesophageal phase 06/12/2014   GERD 08/02/2007   HYPERLIPIDEMIA 08/02/2007   Hyperlipidemia 08/02/2007   Qualifier: Diagnosis of  By: Rachel [name]    INSOMNIA-SLEEP DISORDER-UNSPEC 01/04/2008   INTERMITTENT VERTIGO 05/15/2009   LIBIDO, DECREASED 01/09/2010   Migraine without aura 05/15/2009   Qualifier: Diagnosis of  By: [name], [name]    Nonallopathic lesion of rib cage 12/21/2017   Nonallopathic lesion of sacral region 07/29/2015   Nonallopathic lesion of thoracic region 07/29/2015   Osteoporosis 04/25/2019   Patellofemoral syndrome of both  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/25/2019   Rheumatoid factor positive 02/10/2016   Right knee pain 01/31/2019   Injected January 31, 2019   Type 2 diabetes mellitus with hyperglycemia, with long-term current use of insulin  (HCC) 08/02/2007   Qualifier: Diagnosis of  By: Rachel [name]    Past Surgical History:  Procedure Laterality Date   CESAREAN SECTION     x 3   CYST EXCISION     left knee   PAROTIDECTOMY  10/21/2011   Procedure: PAROTIDECTOMY;  Surgeon: Vaughan Ricker, MD;  Location: [facility] OR;  Service: ENT;  Laterality: Left;   Social History   Socioeconomic History   Marital status: Married    Spouse name: Curlee   Number of children: 3   Years of education: Degree   Highest education level: Associate degree: occupational, Scientist, product/process development, or vocational program  Occupational History   Occupation: Curator: [employer]   Occupation: Psychologist, sport and exercise    Employer: [employer]  Tobacco Use   Smoking status: Never    Passive exposure: Never   Smokeless tobacco: Never  Vaping Use   Vaping status: Never Used  Substance and Sexual Activity   Alcohol use: No    Alcohol/week: 0.0 standard drinks of alcohol   Drug use: No   Sexual activity: Not on file  Other Topics Concern   Not on file  Social History Narrative   She has 3 daughters.   Patient has a 4 year degree.    Patient working at [employer].    Patient is married to Hammond.   Social Drivers of Corporate investment banker Strain: Low Risk  (09/16/2023)   Overall Financial Resource Strain (CARDIA)    Difficulty of Paying Living Expenses: Not hard at all  Food Insecurity: No Food Insecurity (09/16/2023)   Hunger Vital Sign    Worried About Running Out of Food in the Last Year: Never true    Ran Out of Food in the Last Year: Never true  Transportation Needs: No Transportation Needs (09/16/2023)   PRAPARE - Administrator, Civil Service (Medical): No    Lack of Transportation (Non-Medical): No  Physical Activity:  Insufficiently Active (09/16/2023)   Exercise Vital Sign    Days of Exercise per Week: 3 days    Minutes of Exercise per Session: 30 min  Stress: No Stress Concern Present (09/16/2023)   [employer] of Occupational Health - Occupational Stress Questionnaire    Feeling of Stress : Not at all  Social Connections: Socially Integrated (09/16/2023)   Social Connection and Isolation Panel    Frequency of Communication with Friends and Family: More than three times a week    Frequency of Social Gatherings with Friends and Family: More than three times a week    Attends Religious Services: More than 4 times per year    Active Member of [employer] or Organizations: Yes    Attends Engineer, structural: More than 4 times per year    Marital Status: Married  Catering manager Violence: Not At Risk (09/16/2023)   Humiliation, Afraid, Rape, and Kick questionnaire    Fear of Current or Ex-Partner: No    Emotionally Abused: No    Physically Abused: No    Sexually Abused: No   Current Outpatient Medications on File Prior to Visit  Medication Sig Dispense Refill   Accu-Chek FastClix Lancets MISC Check blood glucose twice daily 204 each 3   aspirin  81 MG EC tablet Take 81 mg by mouth daily.     Blood Glucose Monitoring Suppl (ACCU-CHEK GUIDE) w/Device KIT Use as instructed to check blood sugar 2 times daily 1 kit 0   botulinum toxin Type A  (BOTOX ) 200 units injection Inject 155 units IM Into multiple sites in the face,neck,and head every 90 days 1 each 4   butalbital -acetaminophen -caffeine  (FIORICET ) 50-325-40 MG tablet Take 1 tablet by mouth every 6 (six) hours as needed for headache. 30 tablet 3   carbamazepine  (TEGRETOL ) 200 MG tablet TAKE 1 TABLET TWICE DAILY FOR  ONE WEEK, THEN INCREASE TO 1 TABLET THREE TIMES DAILY. 90 tablet 3   Cholecalciferol  (VITAMIN D3) 50 MCG (2000 UT) capsule Take 2 capsules (4,000 Units total) by mouth daily. 180 capsule 3   Continuous Glucose Sensor (DEXCOM G7 SENSOR) MISC  Apply 1 sensor every 10 days 9 each 4   cycloSPORINE  (RESTASIS ) 0.05 % ophthalmic emulsion Instill 1 drop into both eyes twice a day 180 each 3   cycloSPORINE  (RESTASIS ) 0.05 % ophthalmic emulsion Place 1 drop into both eyes 2 (two) times daily. 60 each 6   Diclofenac  Sodium (PENNSAID ) 2 % SOLN Place 2 g onto the skin 2 (two) times daily. 112 g 3   DULoxetine  (CYMBALTA ) 60 MG capsule Take 1 capsule (60 mg total) by mouth daily. 90 capsule 3   esomeprazole  (NEXIUM ) 40 MG capsule Take 1 capsule (40 mg total) by mouth 2 (two) times daily before a meal. 180 capsule 3   fluorometholone  (FML) 0.1 % ophthalmic suspension Place 1 drop into both eyes 4 (four) times daily. 5 mL 0   Fluticasone -Umeclidin-Vilant (TRELEGY ELLIPTA ) 100-62.5-25 MCG/ACT AEPB Inhale 1 puff into the lungs daily. 60 each 11   gabapentin  (NEURONTIN ) 300 MG capsule Take 2 capsules (600 mg total) by mouth 2 (two) times daily. 120 capsule 3   glipiZIDE  (GLUCOTROL  XL) 2.5 MG 24 hr tablet Take 1 tablet (2.5 mg total) by mouth daily with breakfast. 90 tablet 3   glucose blood (ACCU-CHEK GUIDE) test strip Use to check blood sugar 2 (two) times daily as directed 200 each 3   ibuprofen  (ADVIL ) 800 MG tablet Take 1 tablet (800 mg total) by mouth every 6 (six) hours as needed for pain. 16 tablet 0   insulin  glargine, 1 Unit Dial , (TOUJEO  SOLOSTAR) 300 UNIT/ML Solostar Pen Inject 30 Units into the skin daily. 30 mL 2   Lifitegrast  (XIIDRA ) 5 % SOLN Place 1 drop into both eyes 2 (two) times daily. 60 each 6   linaclotide  (LINZESS ) 145 MCG CAPS capsule Take 1 capsule (145 mcg total) by mouth daily before breakfast 30 capsule 2   metoCLOPramide  (REGLAN ) 5 MG tablet Take 1 tablet (5 mg total) by mouth 3 (three) times daily 30 minutes before meals 90 tablet 1   montelukast  (SINGULAIR ) 10 MG tablet Take 1 tablet (10 mg total) by mouth daily. 90 tablet 3   Multiple Vitamin (MULTIVITAMIN WITH MINERALS) TABS tablet Take 1 tablet by mouth daily.      neomycin -polymyxin-hydrocortisone (CORTISPORIN ) OTIC solution Place 4 drops into the left ear 4 (four) times daily. 10 mL 0   nystatin -triamcinolone  ointment (MYCOLOG) Apply 1 Application topically 2 (two) times daily to affected area(s). 30 g 11   ondansetron  (ZOFRAN  ODT) 8 MG disintegrating tablet Take 1 tablet (8 mg total) by mouth every 8 (eight) hours as needed for nausea or vomiting. 30 tablet 0   ondansetron  (ZOFRAN ) 4 MG tablet Take 1 tablet (4 mg total) by mouth every 8 (eight) hours as needed for nausea and vomiting 40 tablet 1   prednisoLONE  acetate (PRED FORTE ) 1 % ophthalmic suspension Place 1 drop into both eyes 4 (four) times daily. 5 mL 0   predniSONE  (DELTASONE ) 10 MG tablet take 3 tabs by mouth a day for 3 days, then 2 tabs a day for 3 days, then 1 tab per day for 3 days 18 tablet 0   propranolol  (INDERAL ) 20 MG tablet Take 1.5 tablets (30 mg total) by mouth at bedtime. 90 tablet 1   rosuvastatin  (  CRESTOR ) 40 MG tablet Take 1 tablet (40 mg total) by mouth daily. 90 tablet 3   Semaglutide ,0.25 or 0.5MG /DOS, (OZEMPIC , 0.25 OR 0.5 MG/DOSE,) 2 MG/3ML SOPN Inject 0.5 mg into the skin once a week. 9 mL 3   tirzepatide  (MOUNJARO ) 2.5 MG/0.5ML Pen Inject 2.5 mg into the skin once a week. 2 mL 11   tiZANidine  (ZANAFLEX ) 4 MG tablet Take 1 tablet (4 mg total) by mouth every 8 (eight) hours as needed for muscle spasms. 90 tablet 5   triamcinolone  (NASACORT ) 55 MCG/ACT AERO nasal inhaler Place 2 sprays into the nose daily. 16.9 mL 12   Ubrogepant  (UBRELVY ) 100 MG TABS Take 1 tablet (100 mg total) by mouth as needed. May repeat after 2 hours.  Maximum 2 tablets in 24 hours. 10 tablet 11   Current Facility-Administered Medications on File Prior to Visit  Medication Dose Route Frequency Provider Last Rate Last Admin   denosumab  (PROLIA ) injection 60 mg  60 mg Subcutaneous Once [name], MD       Allergies  Allergen Reactions   Lipitor [Atorvastatin]     REACTION: myalgias    Nitroglycerin    Family History  Problem Relation Age of Onset   Hypertension Father    Diabetes Father    Asthma Father    Cervical cancer Maternal Aunt    Heart disease Maternal Aunt    Anesthesia problems Neg Hx    Colon cancer Neg Hx    Breast cancer Neg Hx    Esophageal cancer Neg Hx    Stomach cancer Neg Hx     Objective:   Physical Exam BP 112/60   Pulse 97   Ht 5' 1 (1.549 m)   Wt 149 lb 9.6 oz (67.9 kg)   SpO2 97%   BMI 28.27 kg/m   Wt Readings from Last 20 Encounters:  12/06/23 149 lb 9.6 oz (67.9 kg)  12/01/23 154 lb (69.9 kg)  10/19/23 153 lb (69.4 kg)  10/17/23 153 lb (69.4 kg)  09/16/23 140 lb (63.5 kg)  08/31/23 138 lb (62.6 kg)  07/13/23 147 lb (66.7 kg)  07/06/23 149 lb (67.6 kg)  06/01/23 146 lb (66.2 kg)  04/25/23 148 lb (67.1 kg)  04/19/23 145 lb (65.8 kg)  03/24/23 148 lb (67.1 kg)  02/02/23 144 lb (65.3 kg)  12/17/22 146 lb (66.2 kg)  12/08/22 145 lb (65.8 kg)  12/01/22 143 lb (64.9 kg)  10/20/22 141 lb (64 kg)  10/18/22 144 lb (65.3 kg)  09/20/22 143 lb (64.9 kg)  08/13/22 143 lb 12.8 oz (65.2 kg)   Constitutional: normal weight, in NAD Eyes:  EOMI, no exophthalmos ENT: no neck masses, no cervical lymphadenopathy Cardiovascular: tachycardia, RR, No MRG Respiratory: CTA B Musculoskeletal: no deformities Skin:no rashes Neurological: no tremor with outstretched hands Diabetic Foot Exam - Simple   Simple Foot Form Diabetic Foot exam was performed with the following findings: Yes 12/06/2023  2:57 PM  Visual Inspection No deformities, no ulcerations, no other skin breakdown bilaterally: Yes Sensation Testing Intact to touch and monofilament testing bilaterally: Yes Pulse Check Posterior Tibialis and Dorsalis pulse intact bilaterally: Yes Comments    Assessment:     1. DM2, uncontrolled, insulin -dependent, with probable complications - ? peripheral neuropathy  2. HL  3.  Overweight  4. PN  Plan:     1. Patient with  history of uncontrolled type 2 diabetes, on basal insulin , weekly GLP-1 receptor agonist and sulfonylurea added at last visit.  Of  note, she initially had nausea, vomiting, and bloating with Ozempic , but these improved.  She is on Linzess  for constipation, and this improved, also.  A gastric emptying study showed delayed gastric emptying, but she was on Ozempic  at the time of the test - At last visit, sugars were mostly at goal in the morning, with few exceptions but she was not checking later in the day except for 1 time after lunch, when the sugars were in the 200s.  She had repeated antibiotics and steroid courses before that visit and she was on the lower dose of Ozempic  after she developed nausea with it again so we discussed about continuing Ozempic  and trying to increase the dose whenever able but I also recommended to add a low dose glipizide  ER especially with the holidays coming up.  However, HbA1c obtained last month was higher, at 9.4%. -Of note, a Dexcom CGM was not approved for her.  However, she now mentions that she has better insurance this year so we will try to send a prescription for the freestyle libre 3 to her pharmacy. -At today's visit, sugars are quite high, mostly in the 200s.  She cannot explain this, she has improved her diet and eats a lot of vegetables, does not eat sweets, and she is exercising daily.  I suspect that the higher blood sugars could be due to either steroid drops (which she just finished), or to injecting insulin  into scar tissue.  I advised her how to rotate the injection sites.  I did advise her to try to increase the Toujeo , glipizide , and also the Mounjaro  doses.  I advised her to let me know if the sugars do not improve after this.  I am hoping that we can avoid mealtime insulin . - I advised her to: Patient Instructions  Please increase: - Toujeo  36 units in a.m. - Glipizide  ER 5 mg before b'fast - Mounjaro  5 mg weekly  Please return in 3 months with your  sugar log.   - advised to check sugars at different times of the day - 4x a day, rotating check times - advised for yearly eye exams >> she is UTD - return to clinic in 3 months  2. HL -Latest lipid panel was reviewed from 10/2023: LDL above target but much improved from baseline, the rest of the fact at goal: Lab Results  Component Value Date   CHOL 203 (H) 10/17/2023   HDL 57.70 10/17/2023   LDLCALC 115 (H) 10/17/2023   LDLDIRECT 170.0 04/07/2022   TRIG 150.0 (H) 10/17/2023   CHOLHDL 4 10/17/2023  -He is on Crestor  40 mg daily without side effects  3.  Overweight -She was Ozempic , but no Mounjaro  which should also help with weight loss.  We will increase the dose today. -Weight was approximately stable at last visit and she gained 1 pound net since then  4. PN -B12 level was normal at last check: Lab Results  Component Value Date   VITAMINB12 680 10/17/2023  -I recommended alpha lipoic acid 600 mg twice a day previously, but she did not start  Ilanna Deihl, MD PhD [facility] End[facility]

## 2023-12-07 ENCOUNTER — Ambulatory Visit: Payer: Self-pay | Admitting: Internal Medicine

## 2023-12-07 ENCOUNTER — Encounter: Admitting: Sports Medicine

## 2023-12-07 LAB — MICROALBUMIN / CREATININE URINE RATIO
Creatinine, Urine: 203 mg/dL (ref 20–275)
Microalb Creat Ratio: 6 mg/g{creat} (ref <30)
Microalb, Ur: 1.3 mg/dL

## 2023-12-08 ENCOUNTER — Other Ambulatory Visit (HOSPITAL_COMMUNITY): Payer: Self-pay

## 2023-12-13 ENCOUNTER — Encounter: Payer: Self-pay | Admitting: Family Medicine

## 2023-12-13 ENCOUNTER — Ambulatory Visit (INDEPENDENT_AMBULATORY_CARE_PROVIDER_SITE_OTHER): Admitting: Family Medicine

## 2023-12-13 VITALS — Ht 61.0 in | Wt 147.0 lb

## 2023-12-13 DIAGNOSIS — M216X1 Other acquired deformities of right foot: Secondary | ICD-10-CM | POA: Diagnosis not present

## 2023-12-13 DIAGNOSIS — M216X2 Other acquired deformities of left foot: Secondary | ICD-10-CM | POA: Diagnosis not present

## 2023-12-13 NOTE — Progress Notes (Signed)
 DATE OF VISIT: 12/13/2023        Rachel [name] DOB: Sep 21, 1965 MRN: [id number]  CC:  Custom orthotics  History of present Illness: Rachel [name] is a 58 y.o. female who presents for custom orthotics Referred by Dr Darlyn Sharps, DO for custom orthotics as she has been having increasing pain in her feet and knees, and noted to have some transverse arch breakdown bilaterally No prior custom orthotics  Medications:  Outpatient Encounter Medications as of 12/13/2023  Medication Sig   Accu-Chek FastClix Lancets MISC Check blood glucose twice daily   aspirin  81 MG EC tablet Take 81 mg by mouth daily.   Blood Glucose Monitoring Suppl (ACCU-CHEK GUIDE) w/Device KIT Use as instructed to check blood sugar 2 times daily   botulinum toxin Type A  (BOTOX ) 200 units injection Inject 155 units IM Into multiple sites in the face,neck,and head every 90 days   butalbital -acetaminophen -caffeine  (FIORICET ) 50-325-40 MG tablet Take 1 tablet by mouth every 6 (six) hours as needed for headache.   carbamazepine  (TEGRETOL ) 200 MG tablet TAKE 1 TABLET TWICE DAILY FOR ONE WEEK, THEN INCREASE TO 1 TABLET THREE TIMES DAILY.   Cholecalciferol  (VITAMIN D3) 50 MCG (2000 UT) capsule Take 2 capsules (4,000 Units total) by mouth daily.   Continuous Glucose Sensor (FREESTYLE LIBRE 3 PLUS SENSOR) MISC Use to monitor blood sugar continuously, changing sensor every 15 days.   cycloSPORINE  (RESTASIS ) 0.05 % ophthalmic emulsion Instill 1 drop into both eyes twice a day   cycloSPORINE  (RESTASIS ) 0.05 % ophthalmic emulsion Place 1 drop into both eyes 2 (two) times daily.   Diclofenac  Sodium (PENNSAID ) 2 % SOLN Place 2 g onto the skin 2 (two) times daily.   DULoxetine  (CYMBALTA ) 60 MG capsule Take 1 capsule (60 mg total) by mouth daily.   esomeprazole  (NEXIUM ) 40 MG capsule Take 1 capsule (40 mg total) by mouth 2 (two) times daily before a meal.   fluorometholone  (FML) 0.1 % ophthalmic suspension Place 1 drop into both eyes 4 (four)  times daily.   Fluticasone -Umeclidin-Vilant (TRELEGY ELLIPTA ) 100-62.5-25 MCG/ACT AEPB Inhale 1 puff into the lungs daily.   gabapentin  (NEURONTIN ) 300 MG capsule Take 2 capsules (600 mg total) by mouth 2 (two) times daily.   glipiZIDE  (GLUCOTROL  XL) 2.5 MG 24 hr tablet Take 2 tablets (5 mg total) by mouth daily with breakfast.   glucose blood (ACCU-CHEK GUIDE) test strip Use to check blood sugar 2 (two) times daily as directed   ibuprofen  (ADVIL ) 800 MG tablet Take 1 tablet (800 mg total) by mouth every 6 (six) hours as needed for pain.   insulin  glargine, 1 Unit Dial , (TOUJEO  SOLOSTAR) 300 UNIT/ML Solostar Pen Inject 36-38 Units into the skin daily.   Lifitegrast  (XIIDRA ) 5 % SOLN Place 1 drop into both eyes 2 (two) times daily.   linaclotide  (LINZESS ) 145 MCG CAPS capsule Take 1 capsule (145 mcg total) by mouth daily before breakfast   metoCLOPramide  (REGLAN ) 5 MG tablet Take 1 tablet (5 mg total) by mouth 3 (three) times daily 30 minutes before meals   montelukast  (SINGULAIR ) 10 MG tablet Take 1 tablet (10 mg total) by mouth daily.   Multiple Vitamin (MULTIVITAMIN WITH MINERALS) TABS tablet Take 1 tablet by mouth daily.   neomycin -polymyxin-hydrocortisone (CORTISPORIN ) OTIC solution Place 4 drops into the left ear 4 (four) times daily.   nystatin -triamcinolone  ointment (MYCOLOG) Apply 1 Application topically 2 (two) times daily to affected area(s).   ondansetron  (ZOFRAN  ODT) 8 MG disintegrating tablet Take 1 tablet (8  mg total) by mouth every 8 (eight) hours as needed for nausea or vomiting.   ondansetron  (ZOFRAN ) 4 MG tablet Take 1 tablet (4 mg total) by mouth every 8 (eight) hours as needed for nausea and vomiting   prednisoLONE  acetate (PRED FORTE ) 1 % ophthalmic suspension Place 1 drop into both eyes 4 (four) times daily.   predniSONE  (DELTASONE ) 10 MG tablet take 3 tabs by mouth a day for 3 days, then 2 tabs a day for 3 days, then 1 tab per day for 3 days   propranolol  (INDERAL ) 20 MG  tablet Take 1.5 tablets (30 mg total) by mouth at bedtime.   rosuvastatin  (CRESTOR ) 40 MG tablet Take 1 tablet (40 mg total) by mouth daily.   tirzepatide  (MOUNJARO ) 5 MG/0.5ML Pen Inject 5 mg into the skin once a week.   tiZANidine  (ZANAFLEX ) 4 MG tablet Take 1 tablet (4 mg total) by mouth every 8 (eight) hours as needed for muscle spasms.   triamcinolone  (NASACORT ) 55 MCG/ACT AERO nasal inhaler Place 2 sprays into the nose daily.   Ubrogepant  (UBRELVY ) 100 MG TABS Take 1 tablet (100 mg total) by mouth as needed. May repeat after 2 hours.  Maximum 2 tablets in 24 hours.   Facility-Administered Encounter Medications as of 12/13/2023  Medication   denosumab  (PROLIA ) injection 60 mg    Allergies: is allergic to lipitor [atorvastatin] and nitroglycerin.  Physical Examination: Vitals: Ht 5' 1 (1.549 m)   Wt 147 lb (66.7 kg)   BMI 27.78 kg/m  GENERAL:  Rachel [name] is a 58 y.o. female appearing their stated age, alert and oriented x 3, in no apparent distress.  SKIN: no rashes or lesions, skin clean, dry, intact MSK: Foot/ankle: Bilateral foot and ankle with mild transverse arch breakdown with slight splaying between the 1st and 2nd toes bilaterally.  Is noted to have moderate longitudinal arches bilaterally. Walking without a limp Neurovascular intact distally  Assessment & Plan  1. Loss of transverse plantar arch of right foot 2. Loss of transverse plantar arch of left foot Bilateral foot and associated knee pain with transverse arch collapse.  Candidate for custom orthotics today  Plan: - Most from Dr. Claudene reviewed during the visit today - Custom orthotics fabricated as noted below  Patient was fitted for a : Fit 'n Run semi-rigid orthotic  The orthotic was heated, placed on the orthotic stand. The patient was positioned in subtalar neutral position and 10 degrees of ankle dorsiflexion in a weight bearing stance on the heated orthotic blank After completion of  molding Blank: Fit 'n Run - size 6 Posting:  bilateral medial heel wedge Base: built-in base - Fit and Run  Products were comfortable in the office today.  She had more neutral gait and alignment.  - She will continue to follow-up with Dr. Claudene as directed - She will follow-up with us  on an as needed basis  Patient expressed understanding & agreement with above.  No diagnosis found.  No orders of the defined types were placed in this encounter.

## 2023-12-13 NOTE — Patient Instructions (Signed)
 Thank you for coming to see me today. It was a pleasure.   We made you new orthotics. Let us know if we need to add any extra cushioning or make changes.  If you have any questions or concerns, please do not hesitate to call the office at [phone].

## 2024-01-18 ENCOUNTER — Other Ambulatory Visit (HOSPITAL_COMMUNITY): Payer: Self-pay

## 2024-01-18 ENCOUNTER — Encounter (HOSPITAL_COMMUNITY): Payer: Self-pay

## 2024-01-18 ENCOUNTER — Other Ambulatory Visit: Payer: Self-pay

## 2024-01-18 ENCOUNTER — Other Ambulatory Visit: Payer: Self-pay | Admitting: Internal Medicine

## 2024-01-18 MED ORDER — ONDANSETRON HCL 4 MG PO TABS
4.0000 mg | ORAL_TABLET | Freq: Three times a day (TID) | ORAL | 1 refills | Status: DC | PRN
  Filled 2024-01-18: qty 40, 14d supply, fill #0
  Filled 2024-02-10: qty 40, 14d supply, fill #1

## 2024-01-18 NOTE — Progress Notes (Unsigned)
 NEUROLOGY FOLLOW UP OFFICE NOTE  Rachel [name] [id number]  Assessment/Plan:   Chronic migraine without Aura, Without Status Migrainosus, Not Intractable - tried multiple preventatives such as topiramate , Depakote , nortriptyline , venlafaxine , atenolol , Aimovig , Emgality  - meets criteria for Botox .  Prior to Botox , she averaged 15 headache days a month and saw significant improvement just after the first round.   Cervicogenic component to her migraines.  Has radiculopathy and spondylosis      Botox . Ubrelvy  for acute therapy Limit use of pain relievers to no more than 9 days out of the month to prevent risk of rebound or medication-overuse headache. Keep headache diary Follow up routine visit ***  Subjective:  Rachel [name] is a 58 year old right-handed female with type 2 diabetes, hyperlipidemia, GERD, asthma, and migraine who follows up for migraines.   UPDATE: Status post 2 treatments of Botox . ***   Current NSAIDS: Fioricet , hydrocodone -acetaminophen   Current analgesics: none Current triptans: none Current ergotamine: None Current anti-emetic: promethazine , Reglan , Zofran  4mg  Current muscle relaxants: tizanidine  4mg  PRN Current anti-anxiolytic: None Current sleep aide: None Current Antihypertensive medications: propranolol  30mg  BID Current Antidepressant medications: Cymbalta  60mg  daily Current Anticonvulsant medications: Carbamazepine  200mg  three times daily, gabapentin  600 mg twice daily (for back pain) Current anti-CGRP: Ubrelvy  100mg  Current Vitamins/Herbal/Supplements: Turmeric Current Antihistamines/Decongestants: mecllzine, Allegra    Caffeine : One cup of coffee daily Diet: Drinks plenty of water.  No soda. Exercise: No Depression: No; Anxiety: yes.  Her mother has been ill.  She has been confused. Other pain: Knee pain. Sleep hygiene: Poor.  Worrying about her mother.   HISTORY: Migraines: Onset:  2011.  She does have remote history of headaches when she  was younger. Location:  Back of head and radiates to the front Quality:  Shooting up from back of head, pounding on top and front of head Initial Intensity:  8/10 Aura:  no Prodrome:  no Associated symptoms:  Nausea, photophobia, phonophobia, blurred vision (she gets annual eye exams due to diabetes) Initial Duration:  All day Initial Frequency:  Almost daily (cannot function 6 days per month) Triggers/exacerbating factors:  wine Relieving factors:  No Activity:  Cannot work about 6 days per month (she works as an Designer, fashion/clothing)   Past NSAIDS: Cambia  (made headache worse), flurbiprofen , Sprix  nasal spray, ibuprofen ; naproxen  Past analgesics: Tramadol , Excedrin, Tylenol , Fioricet  Past abortive triptans: Sumatriptan  100 mg, Zembrace-SymTouch (did not like the feeling), Zomig  5 mg NS (did not like the way it made her feel), Maxalt  (dizziness, ineffective), Relpax  (made her feel sick) Past abortive ergotamine: Cafergot  Past muscle relaxants: Robaxin , Flexeril  Past anti-emetic: Zofran  8mg , Reglan , Phenergan  Past antihypertensive medications: Atenolol  Past antidepressant medications: Venlafaxine  XR 150 mg; nortriptyline  Past anticonvulsant medications: Topiramate , Depakote  Past anti-CGRP: Aimovig  140mg , Emgality , Nurtec (dizziness), Ubrelvy  (ineffective) Past vitamins/Herbal/Supplements: None Past antihistamines/decongestants: None Other past therapies: Epidural injections (helps for 2 weeks)   Frequent Falls: For almost 10 years, she has had falls, but they have become more frequent over the past year.  Previously, she was diagnosed with vertigo.  However, she reports that she doesn't note spinning or lightheadedness.  She says she just falls.  On one occasion, she was pushing the seat forward while sitting and just fell over.  Otherwise, it always occurs while walking or on her feet.  She denies double vision or focal numbness or weakness.  MRI of the brain with seizure protocol  was performed on 08/02/11, which was unremarkable.  Sleep deprived EEG was normal.  She has had PT, which was ineffective.  She does report that she sometimes trips while walking but doesn't fall.   Cervical plain films from 05/07/15 showed mild degenerative disc disease at C5-6, but otherwise unremarkable.  Lumbar films from 06/10/15 were personally reviewed and were negative.  She underwent anothner MRI of brain with and without contrast on 02/10/16, and was stable compared to prior MRI from 2013.  NCV-EMG from 05/13/16 was negative for neuropathy or lumbosacral radiculopathy.  MRI cervical spine on 03/31/2019 showed degenerative spondylosis but no significant spinal stenosis or cord compression. Denies diplopia or visual hallucinations.  PAST MEDICAL HISTORY: Past Medical History:  Diagnosis Date   ALLERGIC RHINITIS 10/04/2007   Anemia 01/21/2011   Anxiety 11/15/2018   ASTHMA 08/02/2007   INHALERS ONLY IN [place]   Asthma 08/02/2007   Qualifier: Diagnosis of  By: [name]    Blood transfusion without reported diagnosis    with first child    Cervical radiculopathy 01/21/2011   Cervicogenic headache 04/27/2017   COMMON MIGRAINE 05/15/2009   Depression 01/09/2010   Qualifier: Diagnosis of  By: [name], [name]    DIABETES MELLITUS, TYPE II 08/02/2007   Dysphagia, pharyngoesophageal phase 06/12/2014   GERD 08/02/2007   HYPERLIPIDEMIA 08/02/2007   Hyperlipidemia 08/02/2007   Qualifier: Diagnosis of  By: [name]    INSOMNIA-SLEEP DISORDER-UNSPEC 01/04/2008   INTERMITTENT VERTIGO 05/15/2009   LIBIDO, DECREASED 01/09/2010   Migraine without aura 05/15/2009   Qualifier: Diagnosis of  By: [name], [name]    Nonallopathic lesion of rib cage 12/21/2017   Nonallopathic lesion of sacral region 07/29/2015   Nonallopathic lesion of thoracic region 07/29/2015   Osteoporosis 04/25/2019   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     Blood Glucose Monitoring Suppl (ACCU-CHEK GUIDE) w/Device KIT Use as instructed to check blood sugar 2 times daily 1 kit 0   botulinum toxin Type A  (BOTOX ) 200 units injection Inject 155 units IM Into multiple sites in the face,neck,and head every 90 days 1 each 4   butalbital -acetaminophen -caffeine  (FIORICET ) 50-325-40 MG tablet Take 1 tablet by mouth every 6 (six) hours as needed for headache. 30 tablet 3   carbamazepine  (TEGRETOL ) 200 MG tablet TAKE 1 TABLET TWICE DAILY FOR ONE WEEK, THEN INCREASE TO 1 TABLET THREE TIMES DAILY. 90 tablet 3   Cholecalciferol  (VITAMIN D3) 50 MCG (2000 UT) capsule Take 2 capsules (4,000 Units total) by mouth daily. 180 capsule 3   Continuous Glucose Sensor (FREESTYLE LIBRE 3 PLUS SENSOR) MISC Use to monitor blood sugar continuously, changing sensor every 15 days. 6 each 3   cycloSPORINE  (RESTASIS ) 0.05 % ophthalmic emulsion Instill 1 drop into both eyes twice a day 180 each 3   cycloSPORINE  (RESTASIS ) 0.05 % ophthalmic emulsion Place 1 drop into both eyes 2 (two) times daily. 60 each 6   Diclofenac  Sodium (PENNSAID ) 2 % SOLN Place 2 g onto the skin 2 (two) times daily. 112 g 3   DULoxetine  (CYMBALTA )  60 MG capsule Take 1 capsule (60 mg total) by mouth daily. 90 capsule 3   esomeprazole  (NEXIUM ) 40 MG capsule Take 1 capsule (40 mg total) by mouth 2 (two) times daily before a meal. 180 capsule 3   fluorometholone  (FML) 0.1 % ophthalmic suspension Place 1 drop into both eyes 4 (four) times daily. 5 mL 0   Fluticasone -Umeclidin-Vilant (TRELEGY  ELLIPTA) 100-62.5-25 MCG/ACT AEPB Inhale 1 puff into the lungs daily. 60 each 11   gabapentin  (NEURONTIN ) 300 MG capsule Take 2 capsules (600 mg total) by mouth 2 (two) times daily. 120 capsule 3   glipiZIDE  (GLUCOTROL  XL) 2.5 MG 24 hr tablet Take 2 tablets (5 mg total) by mouth daily with breakfast. 180 tablet 3   glucose blood (ACCU-CHEK GUIDE) test strip Use to check blood sugar 2 (two) times daily as directed 200 each 3   ibuprofen  (ADVIL ) 800 MG tablet Take 1 tablet (800 mg total) by mouth every 6 (six) hours as needed for pain. 16 tablet 0   insulin  glargine, 1 Unit Dial , (TOUJEO  SOLOSTAR) 300 UNIT/ML Solostar Pen Inject 36-38 Units into the skin daily.     Lifitegrast  (XIIDRA ) 5 % SOLN Place 1 drop into both eyes 2 (two) times daily. 60 each 6   linaclotide  (LINZESS ) 145 MCG CAPS capsule Take 1 capsule (145 mcg total) by mouth daily before breakfast 30 capsule 2   metoCLOPramide  (REGLAN ) 5 MG tablet Take 1 tablet (5 mg total) by mouth 3 (three) times daily 30 minutes before meals 90 tablet 1   montelukast  (SINGULAIR ) 10 MG tablet Take 1 tablet (10 mg total) by mouth daily. 90 tablet 3   Multiple Vitamin (MULTIVITAMIN WITH MINERALS) TABS tablet Take 1 tablet by mouth daily.     neomycin -polymyxin-hydrocortisone (CORTISPORIN ) OTIC solution Place 4 drops into the left ear 4 (four) times daily. 10 mL 0   nystatin -triamcinolone  ointment (MYCOLOG) Apply 1 Application topically 2 (two) times daily to affected area(s). 30 g 11   ondansetron  (ZOFRAN  ODT) 8 MG disintegrating tablet Take 1 tablet (8 mg total) by mouth every 8 (eight) hours as needed for nausea or vomiting. 30 tablet 0   ondansetron  (ZOFRAN ) 4 MG tablet Take 1 tablet (4 mg total) by mouth every 8 (eight) hours as needed for nausea and vomiting 40 tablet 1   prednisoLONE  acetate (PRED FORTE ) 1 % ophthalmic suspension Place 1 drop into both eyes 4 (four) times daily. 5 mL 0   predniSONE  (DELTASONE ) 10 MG tablet take 3 tabs by mouth a day  for 3 days, then 2 tabs a day for 3 days, then 1 tab per day for 3 days 18 tablet 0   propranolol  (INDERAL ) 20 MG tablet Take 1.5 tablets (30 mg total) by mouth at bedtime. 90 tablet 1   rosuvastatin  (CRESTOR ) 40 MG tablet Take 1 tablet (40 mg total) by mouth daily. 90 tablet 3   tirzepatide  (MOUNJARO ) 5 MG/0.5ML Pen Inject 5 mg into the skin once a week. 2 mL 3   tiZANidine  (ZANAFLEX ) 4 MG tablet Take 1 tablet (4 mg total) by mouth every 8 (eight) hours as needed for muscle spasms. 90 tablet 5   triamcinolone  (NASACORT ) 55 MCG/ACT AERO nasal inhaler Place 2 sprays into the nose daily. 16.9 mL 12   Ubrogepant  (UBRELVY ) 100 MG TABS Take 1 tablet (100 mg total) by mouth as needed. May repeat after 2 hours.  Maximum 2 tablets in 24 hours. 10 tablet 11   Current Facility-Administered Medications on File  Prior to Visit  Medication Dose Route Frequency Provider Last Rate Last Admin   denosumab  (PROLIA ) injection 60 mg  60 mg Subcutaneous Once [name], MD        ALLERGIES: Allergies  Allergen Reactions   Lipitor [Atorvastatin]     REACTION: myalgias   Nitroglycerin     FAMILY HISTORY: Family History  Problem Relation Age of Onset   Hypertension Father    Diabetes Father    Asthma Father    Cervical cancer Maternal Aunt    Heart disease Maternal Aunt    Anesthesia problems Neg Hx    Colon cancer Neg Hx    Breast cancer Neg Hx    Esophageal cancer Neg Hx    Stomach cancer Neg Hx       Objective:  *** General: No acute distress.  Patient appears ***-groomed.   Head:  Normocephalic/atraumatic Eyes:  Fundi examined but not visualized Neck: supple, no paraspinal tenderness, full range of motion Heart:  Regular rate and rhythm Neurological Exam: alert and oriented.  Speech fluent and not dysarthric, language intact.  CN II-XII intact. Bulk and tone normal, muscle strength 5/5 throughout.  Sensation to light touch intact.  Deep tendon reflexes 2+ throughout, toes downgoing.  Finger  to nose testing intact.  Gait normal, Romberg negative.   Juliene Dunnings, DO  CC: ***

## 2024-01-19 ENCOUNTER — Other Ambulatory Visit (HOSPITAL_COMMUNITY): Payer: Self-pay

## 2024-01-19 ENCOUNTER — Ambulatory Visit (INDEPENDENT_AMBULATORY_CARE_PROVIDER_SITE_OTHER): Admitting: Neurology

## 2024-01-19 ENCOUNTER — Telehealth: Payer: Self-pay

## 2024-01-19 ENCOUNTER — Encounter: Payer: Self-pay | Admitting: Neurology

## 2024-01-19 VITALS — BP 120/80 | HR 78 | Resp 20 | Wt 150.0 lb

## 2024-01-19 DIAGNOSIS — G43009 Migraine without aura, not intractable, without status migrainosus: Secondary | ICD-10-CM

## 2024-01-19 DIAGNOSIS — M542 Cervicalgia: Secondary | ICD-10-CM | POA: Diagnosis not present

## 2024-01-19 NOTE — Patient Instructions (Signed)
 Botox  every 3 months At earliest onset of migraine, take Nurtec - 1 tablet daily as needed.  Let me know if you want a prescription Limit use of pain relievers to no more than 9 days out of the month to prevent risk of rebound or medication-overuse headache. Keep headache diary Follow up 9 months for routine visit but will see you for next Botox 

## 2024-01-19 NOTE — Telephone Encounter (Signed)
 Pharmacy Patient Advocate Encounter   Received notification from CoverMyMeds that prior authorization for Freestyle libre 3 plus sensor is required/requested.   Insurance verification completed.   The patient is insured through [facility] .   Per test claim: PA required; PA submitted to above mentioned insurance via CoverMyMeds Key/confirmation #/EOC [id number] Status is pending

## 2024-01-19 NOTE — Progress Notes (Unsigned)
 [name] [address] [place] [address] Phone: [phone] Subjective:   Rachel [name], am serving as a scribe for Dr. Arthea Claudene.  I'm seeing this patient by the request  of:  [name], MD  CC: Back and neck pain  YEP:Rachel [name]  Rachel [name] is a 58 y.o. female coming in with complaint of back and neck pain. OMT 12/01/2023. Custom orthotics fabrication since last visit.  Patient states doing well. No new concerns or symptoms.  Medications patient has been prescribed: None  Taking:         Reviewed prior external information including notes and imaging from previsou exam, outside providers and external EMR if available.   As well as notes that were available from care everywhere and other healthcare systems.  Past medical history, social, surgical and family history all reviewed in electronic medical record.  No pertanent information unless stated regarding to the chief complaint.   Past Medical History:  Diagnosis Date   ALLERGIC RHINITIS 10/04/2007   Anemia 01/21/2011   Anxiety 11/15/2018   ASTHMA 08/02/2007   INHALERS ONLY IN [place]   Asthma 08/02/2007   Qualifier: Diagnosis of  By: [name]    Blood transfusion without reported diagnosis    with first child    Cervical radiculopathy 01/21/2011   Cervicogenic headache 04/27/2017   COMMON MIGRAINE 05/15/2009   Depression 01/09/2010   Qualifier: Diagnosis of  By: [name], [name]    DIABETES MELLITUS, TYPE II 08/02/2007   Dysphagia, pharyngoesophageal phase 06/12/2014   GERD 08/02/2007   HYPERLIPIDEMIA 08/02/2007   Hyperlipidemia 08/02/2007   Qualifier: Diagnosis of  By: [name]    INSOMNIA-SLEEP DISORDER-UNSPEC 01/04/2008   INTERMITTENT VERTIGO 05/15/2009   LIBIDO, DECREASED 01/09/2010   Migraine without aura 05/15/2009   Qualifier: Diagnosis of  By: [name], [name]    Nonallopathic lesion of rib cage 12/21/2017   Nonallopathic lesion of sacral region  07/29/2015   Nonallopathic lesion of thoracic region 07/29/2015   Osteoporosis 04/25/2019   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   Rheumatoid factor positive 02/10/2016   Right knee pain 01/31/2019   Injected January 31, 2019   Type 2 diabetes mellitus with hyperglycemia, with long-term current use of insulin  (HCC) 08/02/2007   Qualifier: Diagnosis of  By: [name]     Allergies  Allergen Reactions   Lipitor [Atorvastatin]     REACTION: myalgias   Nitroglycerin      Review of Systems:  No headache, visual changes, nausea, vomiting, diarrhea, constipation, dizziness, abdominal pain, skin rash, fevers, chills, night sweats, weight loss, swollen lymph nodes, body aches, joint swelling, chest pain, shortness of breath, mood changes. POSITIVE muscle aches  Objective  Blood pressure 112/74, pulse 97, height 5' 1 (1.549 m), weight 149 lb (67.6 kg), SpO2 98%.   General: No apparent distress alert and oriented x3 mood and affect normal, dressed appropriately.  HEENT: Pupils equal, extraocular movements intact  Respiratory: Patient's speak in full sentences and does not appear short of breath  Cardiovascular: No lower extremity edema, non tender, no erythema  Gait normal overall MSK:  Back does have some loss of lordosis noted.  Some tenderness to palpation of the paraspinal musculature.  Neck exam does have significant stiffness noted especially with sidebending.  Pain is out of proportion when compared to the contralateral side.  Osteopathic findings  C2 flexed rotated and side bent right C7  flexed rotated and side bent left T3 extended rotated and side bent right inhaled rib T7 extended rotated and side bent left L1 flexed rotated and side bent right Sacrum right on right       Assessment and Plan:  No problem-specific Assessment & Plan notes found for this encounter.    Nonallopathic problems  Decision today to treat with OMT was based on Physical  Exam  After verbal consent patient was treated with HVLA, ME, FPR techniques in cervical, rib, thoracic, lumbar, and sacral  areas  Patient tolerated the procedure well with improvement in symptoms  Patient given exercises, stretches and lifestyle modifications  See medications in patient instructions if given  Patient will follow up in 4-8 weeks    The above documentation has been reviewed and is accurate and complete Ilia Dimaano M Niasha Devins, DO          Note: This dictation was prepared with Dragon dictation along with smaller phrase technology. Any transcriptional errors that result from this process are unintentional.

## 2024-01-20 ENCOUNTER — Ambulatory Visit: Admitting: Family Medicine

## 2024-01-20 ENCOUNTER — Encounter: Payer: Self-pay | Admitting: Family Medicine

## 2024-01-20 VITALS — BP 112/74 | HR 97 | Ht 61.0 in | Wt 149.0 lb

## 2024-01-20 DIAGNOSIS — M5412 Radiculopathy, cervical region: Secondary | ICD-10-CM | POA: Diagnosis not present

## 2024-01-20 DIAGNOSIS — M9902 Segmental and somatic dysfunction of thoracic region: Secondary | ICD-10-CM | POA: Diagnosis not present

## 2024-01-20 DIAGNOSIS — M9903 Segmental and somatic dysfunction of lumbar region: Secondary | ICD-10-CM | POA: Diagnosis not present

## 2024-01-20 DIAGNOSIS — M9901 Segmental and somatic dysfunction of cervical region: Secondary | ICD-10-CM

## 2024-01-20 DIAGNOSIS — M9908 Segmental and somatic dysfunction of rib cage: Secondary | ICD-10-CM | POA: Diagnosis not present

## 2024-01-20 DIAGNOSIS — G4486 Cervicogenic headache: Secondary | ICD-10-CM | POA: Diagnosis not present

## 2024-01-20 DIAGNOSIS — M9904 Segmental and somatic dysfunction of sacral region: Secondary | ICD-10-CM

## 2024-01-20 MED ORDER — KETOROLAC TROMETHAMINE 60 MG/2ML IM SOLN
60.0000 mg | Freq: Once | INTRAMUSCULAR | Status: AC
Start: 2024-01-20 — End: 2024-01-20
  Administered 2024-01-20: 60 mg via INTRAMUSCULAR

## 2024-01-20 MED ORDER — METHYLPREDNISOLONE ACETATE 80 MG/ML IJ SUSP
80.0000 mg | Freq: Once | INTRAMUSCULAR | Status: AC
Start: 2024-01-20 — End: 2024-01-20
  Administered 2024-01-20: 80 mg via INTRAMUSCULAR

## 2024-01-20 NOTE — Patient Instructions (Addendum)
 Good to see you! See you again in 2-3 months Cocktail injection today

## 2024-01-20 NOTE — Assessment & Plan Note (Signed)
 Continues to have some difficulty of the neck and headaches more than anything else at the moment.  Will some lower back pain with patient traveling recently.  Toradol  and Depo-Medrol  injections given as well.  Discussed icing regimen and home exercises, increase activity slowly.

## 2024-01-27 ENCOUNTER — Encounter (HOSPITAL_COMMUNITY): Payer: Self-pay

## 2024-01-27 ENCOUNTER — Other Ambulatory Visit: Payer: Self-pay | Admitting: Internal Medicine

## 2024-01-27 ENCOUNTER — Other Ambulatory Visit: Payer: Self-pay

## 2024-01-27 ENCOUNTER — Other Ambulatory Visit (HOSPITAL_COMMUNITY): Payer: Self-pay

## 2024-01-27 MED ORDER — PROPRANOLOL HCL 20 MG PO TABS
30.0000 mg | ORAL_TABLET | Freq: Every day | ORAL | 1 refills | Status: DC
  Filled 2024-01-27: qty 90, 60d supply, fill #0
  Filled 2024-04-12: qty 90, 60d supply, fill #1

## 2024-01-28 ENCOUNTER — Other Ambulatory Visit (HOSPITAL_COMMUNITY): Payer: Self-pay

## 2024-01-28 MED ORDER — LOTEPREDNOL ETABONATE 0.5 % OP SUSP
OPHTHALMIC | 1 refills | Status: AC
  Filled 2024-01-28: qty 5, 14d supply, fill #0
  Filled 2024-02-11: qty 5, 14d supply, fill #1

## 2024-01-30 ENCOUNTER — Other Ambulatory Visit (HOSPITAL_COMMUNITY): Payer: Self-pay

## 2024-02-03 ENCOUNTER — Other Ambulatory Visit: Payer: Self-pay

## 2024-02-06 NOTE — Telephone Encounter (Signed)
 Pharmacy Patient Advocate Encounter  Received notification from [employer] that Prior Authorization for [facility] 3 plus sensor has been APPROVED from 01/19/24 to 06/13/24   PA #/Case ID/Reference #: [id number]

## 2024-02-10 ENCOUNTER — Encounter (HOSPITAL_COMMUNITY): Payer: Self-pay

## 2024-02-10 ENCOUNTER — Other Ambulatory Visit: Payer: Self-pay

## 2024-02-10 ENCOUNTER — Other Ambulatory Visit (HOSPITAL_COMMUNITY): Payer: Self-pay

## 2024-02-11 ENCOUNTER — Other Ambulatory Visit (HOSPITAL_COMMUNITY): Payer: Self-pay

## 2024-02-14 ENCOUNTER — Other Ambulatory Visit (HOSPITAL_COMMUNITY): Payer: Self-pay

## 2024-02-14 ENCOUNTER — Ambulatory Visit: Admitting: Neurology

## 2024-02-15 ENCOUNTER — Other Ambulatory Visit (HOSPITAL_COMMUNITY): Payer: Self-pay

## 2024-02-16 ENCOUNTER — Encounter (HOSPITAL_COMMUNITY): Payer: Self-pay

## 2024-02-16 ENCOUNTER — Other Ambulatory Visit: Payer: Self-pay

## 2024-02-16 ENCOUNTER — Other Ambulatory Visit (HOSPITAL_COMMUNITY): Payer: Self-pay

## 2024-02-21 ENCOUNTER — Other Ambulatory Visit: Payer: Self-pay

## 2024-02-21 ENCOUNTER — Other Ambulatory Visit: Payer: Self-pay | Admitting: Pharmacy Technician

## 2024-02-21 NOTE — Progress Notes (Signed)
 Specialty Pharmacy Refill Coordination Note  Rachel [name] is a 58 y.o. female assessed today regarding refills of clinic administered specialty medication(s) OnabotulinumtoxinA  (Botox )   Clinic requested Courier to Provider Office   Delivery date: 02/27/24   Verified address: [facility] [address]   Medication will be filled on 02/24/24.  Injection Appointment 03/02/24.  Copay $0

## 2024-02-22 ENCOUNTER — Telehealth: Payer: Self-pay

## 2024-02-22 ENCOUNTER — Other Ambulatory Visit (HOSPITAL_COMMUNITY): Payer: Self-pay

## 2024-02-22 ENCOUNTER — Telehealth: Payer: Self-pay | Admitting: Pharmacy Technician

## 2024-02-22 NOTE — Telephone Encounter (Signed)
 Pharmacy Patient Advocate Encounter   Received notification from Pt Calls Messages that prior authorization for BOTOX  200 is required/requested.   Insurance verification completed.   The patient is insured through [facility] .   Per test claim: PA required; PA submitted to above mentioned insurance via Latent Key/confirmation #/EOC [id number] Status is pending

## 2024-02-22 NOTE — Telephone Encounter (Signed)
 PA expire 9/9.   PA team is a PA needed

## 2024-02-22 NOTE — Telephone Encounter (Addendum)
 Medication was filled on 9.9.25 with valid PA. Set for delivery on 9.15.25. A new PA has been submitted, and telephone encounter has been created. Please see telephone encounter dated 9.10.25.

## 2024-02-23 ENCOUNTER — Other Ambulatory Visit: Payer: Self-pay

## 2024-03-01 ENCOUNTER — Other Ambulatory Visit (HOSPITAL_COMMUNITY): Payer: Self-pay

## 2024-03-01 DIAGNOSIS — R300 Dysuria: Secondary | ICD-10-CM | POA: Diagnosis not present

## 2024-03-01 MED ORDER — NITROFURANTOIN MONOHYD MACRO 100 MG PO CAPS
100.0000 mg | ORAL_CAPSULE | Freq: Two times a day (BID) | ORAL | 0 refills | Status: AC
  Filled 2024-03-01: qty 14, 7d supply, fill #0

## 2024-03-01 MED ORDER — PHENAZOPYRIDINE HCL 200 MG PO TABS
200.0000 mg | ORAL_TABLET | Freq: Two times a day (BID) | ORAL | 1 refills | Status: AC
  Filled 2024-03-01: qty 6, 3d supply, fill #0
  Filled 2024-03-18: qty 6, 3d supply, fill #1

## 2024-03-02 ENCOUNTER — Ambulatory Visit (INDEPENDENT_AMBULATORY_CARE_PROVIDER_SITE_OTHER): Admitting: Neurology

## 2024-03-02 ENCOUNTER — Other Ambulatory Visit (HOSPITAL_COMMUNITY): Payer: Self-pay

## 2024-03-02 DIAGNOSIS — G43709 Chronic migraine without aura, not intractable, without status migrainosus: Secondary | ICD-10-CM

## 2024-03-02 MED ORDER — ONABOTULINUMTOXINA 100 UNITS IJ SOLR
200.0000 [IU] | Freq: Once | INTRAMUSCULAR | Status: AC
  Administered 2024-03-02: 155 [IU] via INTRAMUSCULAR

## 2024-03-02 NOTE — Telephone Encounter (Signed)
 Pharmacy Patient Advocate Encounter  Received notification from [employer] that Prior Authorization for BOTOX  200 has been APPROVED from 9.19.25 to 12.19.25   PA #/Case ID/Reference #: [id number]

## 2024-03-02 NOTE — Progress Notes (Signed)

## 2024-03-08 ENCOUNTER — Ambulatory Visit: Admitting: Internal Medicine

## 2024-03-08 NOTE — Progress Notes (Deleted)
 Subjective:     Patient ID: Rachel [name], female   DOB: 03-Oct-1965, 58 y.o.   MRN: [id number]  HPI Rachel [name] is a  58 y.o. woman, returning for f/u for DM2, dx 1987, uncontrolled, insulin -dependent, with probable complications (? peripheral neuropathy, ?  Gastroparesis).  Last visit 3 months ago.  Interim history: No increased urination, blurry vision, chest pain.  She walks daily. She is eating healthy. No sweets. Before last visit she was on steroid by drops.  Sugars were quite high.  PCP switched her Ozempic  to Mounjaro , which she started 1.5 months prior to the appointment.  She was at the lowest dose and I recommended to increase the dose to 5 mg weekly.  She tolerates the higher dose well.  Reviewed HbA1c levels: Lab Results  Component Value Date   HGBA1C 9.4 (H) 10/17/2023   HGBA1C 8.4 (A) 04/25/2023   HGBA1C 8.1 12/17/2022   HGBA1C 8.3 (H) 10/18/2022   HGBA1C 8.4 (A) 08/13/2022   HGBA1C 7.7 (H) 04/07/2022   HGBA1C 7.2 (A) 03/09/2022   HGBA1C 7.2 (A) 11/06/2021   HGBA1C 6.8 (A) 08/04/2021   HGBA1C 6.7 (A) 03/30/2021   HGBA1C 8.3 (H) 12/30/2020   HGBA1C 6.6 (A) 09/16/2020   HGBA1C 7.8 (A) 06/17/2020   HGBA1C 10.0 (H) 08/29/2019   HGBA1C 11.1 (A) 07/02/2019   HGBA1C 10.2 (H) 11/14/2018   HGBA1C 11.2 (H) 08/23/2018   HGBA1C 8.6 (A) 04/26/2018   HGBA1C 0 04/26/2018   HGBA1C 0 (A) 04/26/2018   HGBA1C 0.0 04/26/2018   HGBA1C 12.7 (A) 11/23/2017   HGBA1C 9.2 02/17/2017   HGBA1C 9.2 11/11/2016   HGBA1C 11.5 (H) 06/25/2016   HGBA1C 10.9 04/29/2015   HGBA1C 10.8 (H) 01/09/2015   HGBA1C 12.7 (H) 06/24/2014   HGBA1C 15.1 (H) 10/04/2013   HGBA1C 8.4 (H) 01/11/2013  02/17/2017: HbA1c calculated from fructosamine is better, at 8.34%, but still high. Prev. 10.1%.  Reviewed history: She came off all DM medicines for 6 mo before the HbA1c in 09/2013. She again ran out of all meds in 04/2016.  She is  on: - Toujeo  24-28 >> 30 >> 36 units in a.m. - Ozempic  added 06/2019 >>  ...0.5 mg weekly >> Mounjaro  2.5 mg weekly (started 10/17/2023) >> 5 mg weekly - Glipizide  ER 2.5 mg before b'fast - added 04/2023 >> 5 mg in a.m. Stopped Januvia  100 mg in am >> then Ozempic  0.5 mg weekly She is off Metformin  XR 1000 mg 2x a day with b'fast and dinner >> upset stomach >> stopped 04/2019 We tried Trulicity  1.5 mg weekly >> worked great but had to stop b/c GERD >> stopped. We tried Jardiance  25 mg daily >> started 08/2016 >> but stopped recently 2/2 recurrent UTIs She was on Glipizide  5 mg bid - added 04/2015 >> was not taking it b/c low CBGs. We stopped Glipizide  XL 5 mg in 07/2014. She had episodes of hypoglycemia with Amaryl  in the past. She was on Bydureon  2 mg weekly (had nausea). Previously on Humalog , then NovoLog  -started 12/2020. She was previously on Lantus  and Semglee .  She checks her sugars 2 times a day: - am: 87-140, 205 >> 60, 109-120, 189 >> 250-260s - after b'fast: 158-251 >> n/c  - before lunch: 75 (fasting, w/o insulin ) >> 99 >> n/c  - after lunch: up to 200s >> n/c >> 215 >> n/c  - before dinner:   137, 150 >> 140-150  >> n/c >> 220-230 - after dinner: 166-245 >> n/c -  bedtime:  145-150s >> n/c >> 200 >> 103-125, 140 >> n/c She has hypoglycemia awareness in the 90s. Lowest: 38 (while on Trulicity ) >> .SABRA.  60 >> 200 Highest 600 >> ...  215 >> 260  She saw nutrition in the past.  -+ HL. Last lipids: Lab Results  Component Value Date   CHOL 203 (H) 10/17/2023   HDL 57.70 10/17/2023   LDLCALC 115 (H) 10/17/2023   LDLDIRECT 170.0 04/07/2022   TRIG 150.0 (H) 10/17/2023   CHOLHDL 4 10/17/2023  On Crestor  40 daily.  -No CKD: Lab Results  Component Value Date   BUN 12 10/17/2023   Lab Results  Component Value Date   CREATININE 0.68 10/17/2023   Lab Results  Component Value Date   MICRALBCREAT 6 12/06/2023   MICRALBCREAT 0.5 07/13/2010   MICRALBCREAT 3.6 07/07/2009   MICRALBCREAT 6.4 07/05/2008   MICRALBCREAT 9.7 09/29/2007   -+  Numbness and tingling in the right leg.  Last foot exam 12/06/2023.  - Latest eye appointment was 03/10/2023: No DR.  She had a gastric emptying study (02/03/2021) showed gastroparesis (but checked on Ozempic !). Started on Reglan , but not using it consistently. She has constipation - on Linzess . 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 She was dx'ed with OSA >> on CPAP.  She is on disability.  Review of Systems + see HPI  I reviewed pt's medications, allergies, PMH, social hx, family hx, and changes were documented in the history of present illness. Otherwise, unchanged from my initial visit note.  Past Medical History:  Diagnosis Date   ALLERGIC RHINITIS 10/04/2007   Anemia 01/21/2011   Anxiety 11/15/2018   ASTHMA 08/02/2007   INHALERS ONLY IN [place]   Asthma 08/02/2007   Qualifier: Diagnosis of  By: [name]    Blood transfusion without reported diagnosis    with first child    Cervical radiculopathy 01/21/2011   Cervicogenic headache 04/27/2017   COMMON MIGRAINE 05/15/2009   Depression 01/09/2010   Qualifier: Diagnosis of  By: [name], [name]    DIABETES MELLITUS, TYPE II 08/02/2007   Dysphagia, pharyngoesophageal phase 06/12/2014   GERD 08/02/2007   HYPERLIPIDEMIA 08/02/2007   Hyperlipidemia 08/02/2007   Qualifier: Diagnosis of  By: [name]    INSOMNIA-SLEEP DISORDER-UNSPEC 01/04/2008   INTERMITTENT VERTIGO 05/15/2009   LIBIDO, DECREASED 01/09/2010   Migraine without aura 05/15/2009   Qualifier: Diagnosis of  By: [name], [name]    Nonallopathic lesion of rib cage 12/21/2017   Nonallopathic lesion of sacral region 07/29/2015   Nonallopathic lesion of thoracic region 07/29/2015   Osteoporosis 04/25/2019   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   Rheumatoid factor positive 02/10/2016   Right knee pain 01/31/2019   Injected January 31, 2019   Type 2 diabetes mellitus with hyperglycemia, with long-term current use of insulin  (HCC) 08/02/2007   Qualifier:  Diagnosis of  By: [name]    Past Surgical History:  Procedure Laterality Date   CESAREAN SECTION     x 3   CYST EXCISION     left knee   PAROTIDECTOMY  10/21/2011   Procedure: PAROTIDECTOMY;  Surgeon: Vaughan Ricker, MD;  Location: [facility] OR;  Service: ENT;  Laterality: Left;   Social History   Socioeconomic History   Marital status: Married    Spouse name: Curlee   Number of children: 3   Years of education: Degree   Highest education level: Associate degree:  occupational, Scientist, product/process development, or vocational program  Occupational History   Occupation: Curator: [employer]   Occupation: Psychologist, sport and exercise    Employer: [employer]  Tobacco Use   Smoking status: Never    Passive exposure: Never   Smokeless tobacco: Never  Vaping Use   Vaping status: Never Used  Substance and Sexual Activity   Alcohol use: No    Alcohol/week: 0.0 standard drinks of alcohol   Drug use: No   Sexual activity: Not on file  Other Topics Concern   Not on file  Social History Narrative   She has 3 daughters.   Patient has a 4 year degree.    Patient working at [employer].    Patient is married to [place].   Social Drivers of Corporate investment banker Strain: Low Risk  (09/16/2023)   Overall Financial Resource Strain (CARDIA)    Difficulty of Paying Living Expenses: Not hard at all  Food Insecurity: No Food Insecurity (09/16/2023)   Hunger Vital Sign    Worried About Running Out of Food in the Last Year: Never true    Ran Out of Food in the Last Year: Never true  Transportation Needs: No Transportation Needs (09/16/2023)   PRAPARE - Administrator, Civil Service (Medical): No    Lack of Transportation (Non-Medical): No  Physical Activity: Insufficiently Active (09/16/2023)   Exercise Vital Sign    Days of Exercise per Week: 3 days    Minutes of Exercise per Session: 30 min  Stress: No Stress Concern Present (09/16/2023)   [employer] of Occupational Health -  Occupational Stress Questionnaire    Feeling of Stress : Not at all  Social Connections: Socially Integrated (09/16/2023)   Social Connection and Isolation Panel    Frequency of Communication with Friends and Family: More than three times a week    Frequency of Social Gatherings with Friends and Family: More than three times a week    Attends Religious Services: More than 4 times per year    Active Member of [employer] or Organizations: Yes    Attends Engineer, structural: More than 4 times per year    Marital Status: Married  Catering manager Violence: Not At Risk (09/16/2023)   Humiliation, Afraid, Rape, and Kick questionnaire    Fear of Current or Ex-Partner: No    Emotionally Abused: No    Physically Abused: No    Sexually Abused: No   Current Outpatient Medications on File Prior to Visit  Medication Sig Dispense Refill   Accu-Chek FastClix Lancets MISC Check blood glucose twice daily 204 each 3   aspirin  81 MG EC tablet Take 81 mg by mouth daily.     Blood Glucose Monitoring Suppl (ACCU-CHEK GUIDE) w/Device KIT Use as instructed to check blood sugar 2 times daily 1 kit 0   botulinum toxin Type A  (BOTOX ) 200 units injection Inject 155 units IM Into multiple sites in the face,neck,and head every 90 days 1 each 4   butalbital -acetaminophen -caffeine  (FIORICET ) 50-325-40 MG tablet Take 1 tablet by mouth every 6 (six) hours as needed for headache. 30 tablet 3   carbamazepine  (TEGRETOL ) 200 MG tablet TAKE 1 TABLET TWICE DAILY FOR ONE WEEK, THEN INCREASE TO 1 TABLET THREE TIMES DAILY. 90 tablet 3   Cholecalciferol  (VITAMIN D3) 50 MCG (2000 UT) capsule Take 2 capsules (4,000 Units total) by mouth daily. 180 capsule 3   Continuous Glucose Sensor (FREESTYLE LIBRE 3  PLUS SENSOR) MISC Use to monitor blood sugar continuously, changing sensor every 15 days. 6 each 3   cycloSPORINE  (RESTASIS ) 0.05 % ophthalmic emulsion Instill 1 drop into both eyes twice a day 180 each 3   cycloSPORINE  (RESTASIS )  0.05 % ophthalmic emulsion Place 1 drop into both eyes 2 (two) times daily. 60 each 6   Diclofenac  Sodium (PENNSAID ) 2 % SOLN Place 2 g onto the skin 2 (two) times daily. 112 g 3   DULoxetine  (CYMBALTA ) 60 MG capsule Take 1 capsule (60 mg total) by mouth daily. 90 capsule 3   esomeprazole  (NEXIUM ) 40 MG capsule Take 1 capsule (40 mg total) by mouth 2 (two) times daily before a meal. 180 capsule 3   fluorometholone  (FML) 0.1 % ophthalmic suspension Place 1 drop into both eyes 4 (four) times daily. 5 mL 0   Fluticasone -Umeclidin-Vilant (TRELEGY ELLIPTA ) 100-62.5-25 MCG/ACT AEPB Inhale 1 puff into the lungs daily. 60 each 11   gabapentin  (NEURONTIN ) 300 MG capsule Take 2 capsules (600 mg total) by mouth 2 (two) times daily. (Patient not taking: Reported on 01/19/2024) 120 capsule 3   glipiZIDE  (GLUCOTROL  XL) 2.5 MG 24 hr tablet Take 2 tablets (5 mg total) by mouth daily with breakfast. 180 tablet 3   glucose blood (ACCU-CHEK GUIDE) test strip Use to check blood sugar 2 (two) times daily as directed 200 each 3   ibuprofen  (ADVIL ) 800 MG tablet Take 1 tablet (800 mg total) by mouth every 6 (six) hours as needed for pain. 16 tablet 0   insulin  glargine, 1 Unit Dial , (TOUJEO  SOLOSTAR) 300 UNIT/ML Solostar Pen Inject 36-38 Units into the skin daily.     linaclotide  (LINZESS ) 145 MCG CAPS capsule Take 1 capsule (145 mcg total) by mouth daily before breakfast 30 capsule 2   metoCLOPramide  (REGLAN ) 5 MG tablet Take 1 tablet (5 mg total) by mouth 3 (three) times daily 30 minutes before meals 90 tablet 1   montelukast  (SINGULAIR ) 10 MG tablet Take 1 tablet (10 mg total) by mouth daily. 90 tablet 3   Multiple Vitamin (MULTIVITAMIN WITH MINERALS) TABS tablet Take 1 tablet by mouth daily.     neomycin -polymyxin-hydrocortisone (CORTISPORIN ) OTIC solution Place 4 drops into the left ear 4 (four) times daily. 10 mL 0   nitrofurantoin , macrocrystal-monohydrate, (MACROBID ) 100 MG capsule Take 1 capsule (100 mg total) by  mouth every 12 (twelve) hours with food. 14 capsule 0   nystatin -triamcinolone  ointment (MYCOLOG) Apply 1 Application topically 2 (two) times daily to affected area(s). 30 g 11   ondansetron  (ZOFRAN  ODT) 8 MG disintegrating tablet Take 1 tablet (8 mg total) by mouth every 8 (eight) hours as needed for nausea or vomiting. 30 tablet 0   ondansetron  (ZOFRAN ) 4 MG tablet Take 1 tablet (4 mg total) by mouth every 8 (eight) hours as needed for nausea and vomiting 40 tablet 1   phenazopyridine  (PYRIDIUM ) 200 MG tablet Take 1 tablet (200 mg total) by mouth 2 (two) times daily after a meal as needed. 6 tablet 1   prednisoLONE  acetate (PRED FORTE ) 1 % ophthalmic suspension Place 1 drop into both eyes 4 (four) times daily. 5 mL 0   predniSONE  (DELTASONE ) 10 MG tablet take 3 tabs by mouth a day for 3 days, then 2 tabs a day for 3 days, then 1 tab per day for 3 days (Patient not taking: Reported on 01/19/2024) 18 tablet 0   propranolol  (INDERAL ) 20 MG tablet Take 1.5 tablets (30 mg total) by mouth at  bedtime. 90 tablet 1   rosuvastatin  (CRESTOR ) 40 MG tablet Take 1 tablet (40 mg total) by mouth daily. 90 tablet 3   tirzepatide  (MOUNJARO ) 5 MG/0.5ML Pen Inject 5 mg into the skin once a week. 2 mL 3   tiZANidine  (ZANAFLEX ) 4 MG tablet Take 1 tablet (4 mg total) by mouth every 8 (eight) hours as needed for muscle spasms. 90 tablet 5   triamcinolone  (NASACORT ) 55 MCG/ACT AERO nasal inhaler Place 2 sprays into the nose daily. 16.9 mL 12   Current Facility-Administered Medications on File Prior to Visit  Medication Dose Route Frequency Provider Last Rate Last Admin   denosumab  (PROLIA ) injection 60 mg  60 mg Subcutaneous Once [name], MD       Allergies  Allergen Reactions   Lipitor [Atorvastatin]     REACTION: myalgias   Nitroglycerin    Family History  Problem Relation Age of Onset   Hypertension Father    Diabetes Father    Asthma Father    Cervical cancer Maternal Aunt    Heart disease Maternal  Aunt    Anesthesia problems Neg Hx    Colon cancer Neg Hx    Breast cancer Neg Hx    Esophageal cancer Neg Hx    Stomach cancer Neg Hx     Objective:   Physical Exam There were no vitals taken for this visit.  Wt Readings from Last 20 Encounters:  01/20/24 149 lb (67.6 kg)  01/19/24 150 lb (68 kg)  12/13/23 147 lb (66.7 kg)  12/06/23 149 lb 9.6 oz (67.9 kg)  12/01/23 154 lb (69.9 kg)  10/19/23 153 lb (69.4 kg)  10/17/23 153 lb (69.4 kg)  09/16/23 140 lb (63.5 kg)  08/31/23 138 lb (62.6 kg)  07/13/23 147 lb (66.7 kg)  07/06/23 149 lb (67.6 kg)  06/01/23 146 lb (66.2 kg)  04/25/23 148 lb (67.1 kg)  04/19/23 145 lb (65.8 kg)  03/24/23 148 lb (67.1 kg)  02/02/23 144 lb (65.3 kg)  12/17/22 146 lb (66.2 kg)  12/08/22 145 lb (65.8 kg)  12/01/22 143 lb (64.9 kg)  10/20/22 141 lb (64 kg)   Constitutional: normal weight, in NAD Eyes:  EOMI, no exophthalmos ENT: no neck masses, no cervical lymphadenopathy Cardiovascular: RRR, No MRG Respiratory: CTA B Musculoskeletal: no deformities Skin:no rashes Neurological: no tremor with outstretched hands  Assessment:     1. DM2, uncontrolled, insulin -dependent, with probable complications - ? peripheral neuropathy  2. HL  3.  Overweight  4. PN  Plan:     1. Patient with history of uncontrolled type 2 diabetes, basal insulin , weekly GLP-1/GIP receptor agonist and sulfonylurea, with all doses increased at last visit due to very high blood sugars.  At that time, sugars are mostly in the 200s, despite improving her diet and exercising daily.  These hyperglycemic values could have been due to her steroid eyedrops or injecting insulin  into scar tissue.  She just finished the eyedrops and we discussed about how to inject the insulin  correctly and rotate the injection sites.  We also increased her Toujeo , glipizide , and the Mounjaro  dose.  I did advise her to let me know if the sugars did not improve, in which case we would have needed  mealtime insulin . - Of note, a Dexcom CGM was not approved for her.  At last visit she felt that the libre 3 CGM may be covered so I sent a prescription for this to her pharmacy. - I advised her to:  Patient Instructions  Please continue: - Toujeo  36 units in a.m. - Glipizide  ER 5 mg before b'fast - Mounjaro  5 mg weekly  Please return in 3-4 months with your sugar log.   - we checked her HbA1c: 7%  - advised to check sugars at different times of the day - 4x a day, rotating check times - advised for yearly eye exams >> she is UTD - return to clinic in 3-4 months  2. HL - Latest lipid panel showed an LDL above target but much improved from baseline and the rest of the fractions were at goal: Lab Results  Component Value Date   CHOL 203 (H) 10/17/2023   HDL 57.70 10/17/2023   LDLCALC 115 (H) 10/17/2023   LDLDIRECT 170.0 04/07/2022   TRIG 150.0 (H) 10/17/2023   CHOLHDL 4 10/17/2023  - She is on Crestor  40 mg daily without side effects  3.  Overweight - Previously on Ozempic , but currently on Mounjaro , which should also help with weight loss - Weight was approximately stable at the last 2 visits.  4. PN - Latest B12 level was normal: Lab Results  Component Value Date   VITAMINB12 680 10/17/2023  - Previously recommended alpha lipoic acid 600 mg twice a day but she did not start  Kenyatte Chatmon, MD PhD [facility] Endocrinology

## 2024-03-19 ENCOUNTER — Other Ambulatory Visit (HOSPITAL_COMMUNITY): Payer: Self-pay

## 2024-03-20 ENCOUNTER — Other Ambulatory Visit (HOSPITAL_COMMUNITY): Payer: Self-pay

## 2024-03-20 NOTE — Progress Notes (Unsigned)
 [name] [address] [place] [address] Phone: [phone] Subjective:   Rachel [name], am serving as a scribe for Dr. Arthea [name].\  I'm seeing this patient by the request  of:  Rachel [name], MD  CC: Back and neck pain  YEP:Dlagzrupcz  Rachel [name] is a 58 y.o. female coming in with complaint of back and neck pain. OMT 01/20/2024. Patient states that she is having more pain today. B knee pain also.   Medications patient has been prescribed: None  Taking:         Reviewed prior external information including notes and imaging from previsou exam, outside providers and external EMR if available.   As well as notes that were available from care everywhere and other healthcare systems.  Past medical history, social, surgical and family history all reviewed in electronic medical record.  No pertanent information unless stated regarding to the chief complaint.   Past Medical History:  Diagnosis Date   ALLERGIC RHINITIS 10/04/2007   Anemia 01/21/2011   Anxiety 11/15/2018   ASTHMA 08/02/2007   INHALERS ONLY IN [place]   Asthma 08/02/2007   Qualifier: Diagnosis of  By: Rachel [name]    Blood transfusion without reported diagnosis    with first child    Cervical radiculopathy 01/21/2011   Cervicogenic headache 04/27/2017   COMMON MIGRAINE 05/15/2009   Depression 01/09/2010   Qualifier: Diagnosis of  By: [name], Lynwood [name]    DIABETES MELLITUS, TYPE II 08/02/2007   Dysphagia, pharyngoesophageal phase 06/12/2014   GERD 08/02/2007   HYPERLIPIDEMIA 08/02/2007   Hyperlipidemia 08/02/2007   Qualifier: Diagnosis of  By: Rachel [name]    INSOMNIA-SLEEP DISORDER-UNSPEC 01/04/2008   INTERMITTENT VERTIGO 05/15/2009   LIBIDO, DECREASED 01/09/2010   Migraine without aura 05/15/2009   Qualifier: Diagnosis of  By: [name], Lynwood [name]    Nonallopathic lesion of rib cage 12/21/2017   Nonallopathic lesion of sacral region 07/29/2015   Nonallopathic lesion  of thoracic region 07/29/2015   Osteoporosis 04/25/2019   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   Rheumatoid factor positive 02/10/2016   Right knee pain 01/31/2019   Injected January 31, 2019   Type 2 diabetes mellitus with hyperglycemia, with long-term current use of insulin  (HCC) 08/02/2007   Qualifier: Diagnosis of  By: Rachel [name]     Allergies  Allergen Reactions   Lipitor [Atorvastatin]     REACTION: myalgias   Nitroglycerin      Review of Systems:  No headache, visual changes, nausea, vomiting, diarrhea, constipation, dizziness, abdominal pain, skin rash, fevers, chills, night sweats, weight loss, swollen lymph nodes, body aches, joint swelling, chest pain, shortness of breath, mood changes. POSITIVE muscle aches  Objective  Blood pressure 108/72, pulse 95, height 5' 1 (1.549 m), weight 156 lb (70.8 kg), SpO2 96%.   General: No apparent distress alert and oriented x3 mood and affect normal, dressed appropriately.  HEENT: Pupils equal, extraocular movements intact  Respiratory: Patient's speak in full sentences and does not appear short of breath  Cardiovascular: No lower extremity edema, non tender, no erythema  Gait relatively normal MSK:  Back  Tightness with pain out of proportion to the amount of palpation.  Patient's neck exam does have some limited sidebending bilaterally.  Regarding the knees do have significant stiffness noted.   Osteopathic findings  C2 flexed rotated and side bent right C7 flexed rotated and side bent left T3 extended rotated  and side bent right inhaled rib T9 extended rotated and side bent left L2 flexed rotated and side bent right Sacrum right on right  After informed written and verbal consent, patient was seated on exam table. Right knee was prepped with alcohol swab and utilizing anterolateral approach, patient's right knee space was injected with 4:1  marcaine 0.5%: Kenalog  40mg /dL. Patient tolerated the procedure well  without immediate complications.  After informed written and verbal consent, patient was seated on exam table. Left knee was prepped with alcohol swab and utilizing anterolateral approach, patient's left knee space was injected with 4:1  marcaine 0.5%: Kenalog  40mg /dL. Patient tolerated the procedure well without immediate complications.   Assessment and Plan:  Degenerative arthritis of knee, bilateral Patient given injection again today, tolerated procedure well, discussed icing regimen and home exercises, discussed which activities to do and which ones to avoid.  Increase activity slowly.  Patient still wants to avoid any type of surgical intervention.  Could be a candidate for the.  Has not responded to that previously in May 2023.  Follow-up again in 6 to 8 weeks  Cervicogenic headache Chronic, with exacerbation.  Discussed with patient about icing regimen and home exercises.  Declined Toradol  and Depo-Medrol  but restart gabapentin  200 mg at night to help with nighttime pain.  Follow-up again in 8 weeks    Nonallopathic problems  Decision today to treat with OMT was based on Physical Exam  After verbal consent patient was treated with HVLA, ME, FPR techniques in cervical, rib, thoracic, lumbar, and sacral  areas  Patient tolerated the procedure well with improvement in symptoms  Patient given exercises, stretches and lifestyle modifications  See medications in patient instructions if given  Patient will follow up in 4-8 weeks     The above documentation has been reviewed and is accurate and complete Rachel Valko M Holmes Hays, DO         Note: This dictation was prepared with Dragon dictation along with smaller phrase technology. Any transcriptional errors that result from this process are unintentional.

## 2024-03-21 ENCOUNTER — Other Ambulatory Visit (HOSPITAL_COMMUNITY): Payer: Self-pay

## 2024-03-21 ENCOUNTER — Ambulatory Visit: Admitting: Family Medicine

## 2024-03-21 ENCOUNTER — Encounter: Payer: Self-pay | Admitting: Family Medicine

## 2024-03-21 VITALS — BP 108/72 | HR 95 | Ht 61.0 in | Wt 156.0 lb

## 2024-03-21 DIAGNOSIS — M9904 Segmental and somatic dysfunction of sacral region: Secondary | ICD-10-CM | POA: Diagnosis not present

## 2024-03-21 DIAGNOSIS — M170 Bilateral primary osteoarthritis of knee: Secondary | ICD-10-CM | POA: Diagnosis not present

## 2024-03-21 DIAGNOSIS — M9908 Segmental and somatic dysfunction of rib cage: Secondary | ICD-10-CM

## 2024-03-21 DIAGNOSIS — M9903 Segmental and somatic dysfunction of lumbar region: Secondary | ICD-10-CM

## 2024-03-21 DIAGNOSIS — M9901 Segmental and somatic dysfunction of cervical region: Secondary | ICD-10-CM | POA: Diagnosis not present

## 2024-03-21 DIAGNOSIS — M9902 Segmental and somatic dysfunction of thoracic region: Secondary | ICD-10-CM | POA: Diagnosis not present

## 2024-03-21 DIAGNOSIS — G4486 Cervicogenic headache: Secondary | ICD-10-CM

## 2024-03-21 MED ORDER — GABAPENTIN 100 MG PO CAPS
200.0000 mg | ORAL_CAPSULE | Freq: Every day | ORAL | 0 refills | Status: DC
  Filled 2024-03-21: qty 180, 90d supply, fill #0

## 2024-03-21 NOTE — Assessment & Plan Note (Signed)
 Patient given injection again today, tolerated procedure well, discussed icing regimen and home exercises, discussed which activities to do and which ones to avoid.  Increase activity slowly.  Patient still wants to avoid any type of surgical intervention.  Could be a candidate for the.  Has not responded to that previously in May 2023.  Follow-up again in 6 to 8 weeks

## 2024-03-21 NOTE — Patient Instructions (Addendum)
 B knee injections today Gabapentin  200mg  at night See me in 2 months

## 2024-03-21 NOTE — Assessment & Plan Note (Signed)
 Chronic, with exacerbation.  Discussed with patient about icing regimen and home exercises.  Declined Toradol  and Depo-Medrol  but restart gabapentin  200 mg at night to help with nighttime pain.  Follow-up again in 8 weeks

## 2024-03-22 ENCOUNTER — Other Ambulatory Visit (HOSPITAL_COMMUNITY): Payer: Self-pay

## 2024-03-22 ENCOUNTER — Other Ambulatory Visit: Payer: Self-pay | Admitting: Internal Medicine

## 2024-03-23 ENCOUNTER — Other Ambulatory Visit (HOSPITAL_COMMUNITY): Payer: Self-pay

## 2024-03-23 MED ORDER — MOUNJARO 5 MG/0.5ML ~~LOC~~ SOAJ
5.0000 mg | SUBCUTANEOUS | 3 refills | Status: AC
  Filled 2024-03-23 – 2024-04-12 (×2): qty 2, 28d supply, fill #0
  Filled 2024-05-11: qty 2, 28d supply, fill #1
  Filled 2024-06-07: qty 2, 28d supply, fill #2
  Filled 2024-07-02 (×2): qty 2, 28d supply, fill #3

## 2024-03-27 ENCOUNTER — Other Ambulatory Visit (HOSPITAL_COMMUNITY): Payer: Self-pay

## 2024-04-01 ENCOUNTER — Other Ambulatory Visit (HOSPITAL_COMMUNITY): Payer: Self-pay

## 2024-04-02 ENCOUNTER — Other Ambulatory Visit (HOSPITAL_COMMUNITY): Payer: Self-pay

## 2024-04-03 ENCOUNTER — Ambulatory Visit: Admitting: Internal Medicine

## 2024-04-03 NOTE — Progress Notes (Deleted)
 Subjective:  This note was precharted 03/08/2024.  Patient ID: Rachel [name], female   DOB: [birth year], 58 y.o.   MRN: [id number]  HPI Rachel [name] is a  58 y.o. woman, returning for f/u for DM2, dx 1987, uncontrolled, insulin -dependent, with probable complications (+ DR, ? peripheral neuropathy, ?  Gastroparesis).  Last visit 4 months ago.  Interim history: No increased urination, blurry vision, chest pain.  She walks daily. She is eating healthy. No sweets. Before last visit she was on steroid eye drops.  Sugars were quite high.  PCP switched her Ozempic  to Mounjaro , which she started 1.5 months prior to the appointment.  She was at the lowest dose and I recommended to increase the dose to 5 mg weekly.  She tolerates the higher dose well.  Reviewed HbA1c levels: Lab Results  Component Value Date   HGBA1C 9.4 (H) 10/17/2023   HGBA1C 8.4 (A) 04/25/2023   HGBA1C 8.1 12/17/2022   HGBA1C 8.3 (H) 10/18/2022   HGBA1C 8.4 (A) 08/13/2022   HGBA1C 7.7 (H) 04/07/2022   HGBA1C 7.2 (A) 03/09/2022   HGBA1C 7.2 (A) 11/06/2021   HGBA1C 6.8 (A) 08/04/2021   HGBA1C 6.7 (A) 03/30/2021   HGBA1C 8.3 (H) 12/30/2020   HGBA1C 6.6 (A) 09/16/2020   HGBA1C 7.8 (A) 06/17/2020   HGBA1C 10.0 (H) 08/29/2019   HGBA1C 11.1 (A) 07/02/2019   HGBA1C 10.2 (H) 11/14/2018   HGBA1C 11.2 (H) 08/23/2018   HGBA1C 8.6 (A) 04/26/2018   HGBA1C 0 04/26/2018   HGBA1C 0 (A) 04/26/2018   HGBA1C 0.0 04/26/2018   HGBA1C 12.7 (A) 11/23/2017   HGBA1C 9.2 02/17/2017   HGBA1C 9.2 11/11/2016   HGBA1C 11.5 (H) 06/25/2016   HGBA1C 10.9 04/29/2015   HGBA1C 10.8 (H) 01/09/2015   HGBA1C 12.7 (H) 06/24/2014   HGBA1C 15.1 (H) 10/04/2013   HGBA1C 8.4 (H) 01/11/2013  02/17/2017: HbA1c calculated from fructosamine is better, at 8.34%, but still high. Prev. 10.1%.  Reviewed history: She came off all DM medicines for 6 mo before the HbA1c in 09/2013. She again ran out of all meds in 04/2016.  She is on: - Toujeo  24-28 >> 30 >> 36  units in a.m. -  Mounjaro  2.5 mg weekly (started 10/17/2023) >> 5 mg weekly - Glipizide  ER 2.5 mg before b'fast - added 04/2023 >> 5 mg in a.m. Stopped Januvia  100 mg in am >> then Ozempic  0.5 mg weekly She is off Metformin  XR 1000 mg 2x a day with b'fast and dinner >> upset stomach >> stopped 04/2019 We tried Trulicity  1.5 mg weekly >> worked great but had to stop b/c GERD >> stopped. We tried Jardiance  25 mg daily >> started 08/2016 >> but stopped recently 2/2 recurrent UTIs She was on Glipizide  5 mg bid - added 04/2015 >> was not taking it b/c low CBGs. We stopped Glipizide  XL 5 mg in 07/2014. She had episodes of hypoglycemia with Amaryl  in the past. She was on Bydureon  2 mg weekly (had nausea). Previously on Humalog , then NovoLog  -started 12/2020. She was previously on Lantus  and Semglee .  She checks her sugars 2 times a day: - am: 87-140, 205 >> 60, 109-120, 189 >> 250-260s - after b'fast: 158-251 >> n/c  - before lunch: 75 (fasting, w/o insulin ) >> 99 >> n/c  - after lunch: up to 200s >> n/c >> 215 >> n/c  - before dinner:   137, 150 >> 140-150  >> n/c >> 220-230 - after dinner: 166-245 >> n/c - bedtime:  145-150s >>  n/c >> 200 >> 103-125, 140 >> n/c She has hypoglycemia awareness in the 90s. Lowest: 38 (while on Trulicity ) >> .SABRA.  60 >> 200 Highest 600 >> ...  215 >> 260  She saw nutrition in the past.  -+ HL. Last lipids: Lab Results  Component Value Date   CHOL 203 (H) 10/17/2023   HDL 57.70 10/17/2023   LDLCALC 115 (H) 10/17/2023   LDLDIRECT 170.0 04/07/2022   TRIG 150.0 (H) 10/17/2023   CHOLHDL 4 10/17/2023  On Crestor  40 daily.  -No CKD: Lab Results  Component Value Date   BUN 12 10/17/2023   Lab Results  Component Value Date   CREATININE 0.68 10/17/2023   Lab Results  Component Value Date   MICRALBCREAT 6 12/06/2023   MICRALBCREAT 0.5 07/13/2010   MICRALBCREAT 3.6 07/07/2009   MICRALBCREAT 6.4 07/05/2008   MICRALBCREAT 9.7 09/29/2007   -+ Numbness  and tingling in the right leg.  Last foot exam 12/06/2023.  - Latest eye appointment was 08/22/2023: + mild nonproliferative diabetic retinopathy without macular edema, bilateral  She had a gastric emptying study (02/03/2021) showed gastroparesis (but checked on Ozempic !). Started on Reglan , but not using it consistently. She has constipation - on Linzess . 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 She was dx'ed with OSA >> on CPAP.  She is on disability.  Review of Systems + see HPI  I reviewed pt's medications, allergies, PMH, social hx, family hx, and changes were documented in the history of present illness. Otherwise, unchanged from my initial visit note.  Past Medical History:  Diagnosis Date   ALLERGIC RHINITIS 10/04/2007   Anemia 01/21/2011   Anxiety 11/15/2018   ASTHMA 08/02/2007   INHALERS ONLY IN [place]   Asthma 08/02/2007   Qualifier: Diagnosis of  By: [name]    Blood transfusion without reported diagnosis    with first child    Cervical radiculopathy 01/21/2011   Cervicogenic headache 04/27/2017   COMMON MIGRAINE 05/15/2009   Depression 01/09/2010   Qualifier: Diagnosis of  By: [name], [name]    DIABETES MELLITUS, TYPE II 08/02/2007   Dysphagia, pharyngoesophageal phase 06/12/2014   GERD 08/02/2007   HYPERLIPIDEMIA 08/02/2007   Hyperlipidemia 08/02/2007   Qualifier: Diagnosis of  By: [name]    INSOMNIA-SLEEP DISORDER-UNSPEC 01/04/2008   INTERMITTENT VERTIGO 05/15/2009   LIBIDO, DECREASED 01/09/2010   Migraine without aura 05/15/2009   Qualifier: Diagnosis of  By: [name], [name]    Nonallopathic lesion of rib cage 12/21/2017   Nonallopathic lesion of sacral region 07/29/2015   Nonallopathic lesion of thoracic region 07/29/2015   Osteoporosis 04/25/2019   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   Rheumatoid factor positive 02/10/2016   Right knee pain 01/31/2019   Injected January 31, 2019   Type 2 diabetes mellitus with hyperglycemia, with  long-term current use of insulin  (HCC) 08/02/2007   Qualifier: Diagnosis of  By: [name]    Past Surgical History:  Procedure Laterality Date   CESAREAN SECTION     x 3   CYST EXCISION     left knee   PAROTIDECTOMY  10/21/2011   Procedure: PAROTIDECTOMY;  Surgeon: Vaughan Ricker, MD;  Location[facility];  Service: ENT;  Laterality: Left;   Social History   Socioeconomic History   Marital status: Married    Spouse name: Curlee   Number of children: 3   Years of education: Degree   Highest education  level: Associate degree: occupational, Scientist, product/process development, or vocational program  Occupational History   Occupation: Curator: [employer]   Occupation: Psychologist, sport and exercise    Employer: [employer]  Tobacco Use   Smoking status: Never    Passive exposure: Never   Smokeless tobacco: Never  Vaping Use   Vaping status: Never Used  Substance and Sexual Activity   Alcohol use: No    Alcohol/week: 0.0 standard drinks of alcohol   Drug use: No   Sexual activity: Not on file  Other Topics Concern   Not on file  Social History Narrative   She has 3 daughters.   Patient has a 4 year degree.    Patient working at [employer].    Patient is married to [place].   Social Drivers of Corporate investment banker Strain: Low Risk  (09/16/2023)   Overall Financial Resource Strain (CARDIA)    Difficulty of Paying Living Expenses: Not hard at all  Food Insecurity: No Food Insecurity (09/16/2023)   Hunger Vital Sign    Worried About Running Out of Food in the Last Year: Never true    Ran Out of Food in the Last Year: Never true  Transportation Needs: No Transportation Needs (09/16/2023)   PRAPARE - Administrator, Civil Service (Medical): No    Lack of Transportation (Non-Medical): No  Physical Activity: Insufficiently Active (09/16/2023)   Exercise Vital Sign    Days of Exercise per Week: 3 days    Minutes of Exercise per Session: 30 min  Stress: No Stress Concern Present  (09/16/2023)   [employer] of Occupational Health - Occupational Stress Questionnaire    Feeling of Stress : Not at all  Social Connections: Socially Integrated (09/16/2023)   Social Connection and Isolation Panel    Frequency of Communication with Friends and Family: More than three times a week    Frequency of Social Gatherings with Friends and Family: More than three times a week    Attends Religious Services: More than 4 times per year    Active Member of [employer] or Organizations: Yes    Attends Engineer, structural: More than 4 times per year    Marital Status: Married  Catering manager Violence: Not At Risk (09/16/2023)   Humiliation, Afraid, Rape, and Kick questionnaire    Fear of Current or Ex-Partner: No    Emotionally Abused: No    Physically Abused: No    Sexually Abused: No   Current Outpatient Medications on File Prior to Visit  Medication Sig Dispense Refill   Accu-Chek FastClix Lancets MISC Check blood glucose twice daily 204 each 3   aspirin  81 MG EC tablet Take 81 mg by mouth daily.     Blood Glucose Monitoring Suppl (ACCU-CHEK GUIDE) w/Device KIT Use as instructed to check blood sugar 2 times daily 1 kit 0   botulinum toxin Type A  (BOTOX ) 200 units injection Inject 155 units IM Into multiple sites in the face,neck,and head every 90 days 1 each 4   butalbital -acetaminophen -caffeine  (FIORICET ) 50-325-40 MG tablet Take 1 tablet by mouth every 6 (six) hours as needed for headache. 30 tablet 3   carbamazepine  (TEGRETOL ) 200 MG tablet TAKE 1 TABLET TWICE DAILY FOR ONE WEEK, THEN INCREASE TO 1 TABLET THREE TIMES DAILY. 90 tablet 3   Cholecalciferol  (VITAMIN D3) 50 MCG (2000 UT) capsule Take 2 capsules (4,000 Units total) by mouth daily. 180 capsule 3   Continuous Glucose Sensor (  FREESTYLE LIBRE 3 PLUS SENSOR) MISC Use to monitor blood sugar continuously, changing sensor every 15 days. 6 each 3   cycloSPORINE  (RESTASIS ) 0.05 % ophthalmic emulsion Instill 1 drop into both  eyes twice a day 180 each 3   cycloSPORINE  (RESTASIS ) 0.05 % ophthalmic emulsion Place 1 drop into both eyes 2 (two) times daily. 60 each 6   Diclofenac  Sodium (PENNSAID ) 2 % SOLN Place 2 g onto the skin 2 (two) times daily. 112 g 3   DULoxetine  (CYMBALTA ) 60 MG capsule Take 1 capsule (60 mg total) by mouth daily. 90 capsule 3   esomeprazole  (NEXIUM ) 40 MG capsule Take 1 capsule (40 mg total) by mouth 2 (two) times daily before a meal. 180 capsule 3   fluorometholone  (FML) 0.1 % ophthalmic suspension Place 1 drop into both eyes 4 (four) times daily. 5 mL 0   Fluticasone -Umeclidin-Vilant (TRELEGY ELLIPTA ) 100-62.5-25 MCG/ACT AEPB Inhale 1 puff into the lungs daily. 60 each 11   gabapentin  (NEURONTIN ) 100 MG capsule Take 2 capsules (200 mg total) by mouth at bedtime. 180 capsule 0   glipiZIDE  (GLUCOTROL  XL) 2.5 MG 24 hr tablet Take 2 tablets (5 mg total) by mouth daily with breakfast. 180 tablet 3   glucose blood (ACCU-CHEK GUIDE) test strip Use to check blood sugar 2 (two) times daily as directed 200 each 3   ibuprofen  (ADVIL ) 800 MG tablet Take 1 tablet (800 mg total) by mouth every 6 (six) hours as needed for pain. 16 tablet 0   insulin  glargine, 1 Unit Dial , (TOUJEO  SOLOSTAR) 300 UNIT/ML Solostar Pen Inject 36-38 Units into the skin daily.     linaclotide  (LINZESS ) 145 MCG CAPS capsule Take 1 capsule (145 mcg total) by mouth daily before breakfast 30 capsule 2   metoCLOPramide  (REGLAN ) 5 MG tablet Take 1 tablet (5 mg total) by mouth 3 (three) times daily 30 minutes before meals 90 tablet 1   montelukast  (SINGULAIR ) 10 MG tablet Take 1 tablet (10 mg total) by mouth daily. 90 tablet 3   Multiple Vitamin (MULTIVITAMIN WITH MINERALS) TABS tablet Take 1 tablet by mouth daily.     neomycin -polymyxin-hydrocortisone (CORTISPORIN ) OTIC solution Place 4 drops into the left ear 4 (four) times daily. 10 mL 0   nitrofurantoin , macrocrystal-monohydrate, (MACROBID ) 100 MG capsule Take 1 capsule (100 mg total) by  mouth every 12 (twelve) hours with food. 14 capsule 0   nystatin -triamcinolone  ointment (MYCOLOG) Apply 1 Application topically 2 (two) times daily to affected area(s). 30 g 11   ondansetron  (ZOFRAN  ODT) 8 MG disintegrating tablet Take 1 tablet (8 mg total) by mouth every 8 (eight) hours as needed for nausea or vomiting. 30 tablet 0   ondansetron  (ZOFRAN ) 4 MG tablet Take 1 tablet (4 mg total) by mouth every 8 (eight) hours as needed for nausea and vomiting 40 tablet 1   phenazopyridine  (PYRIDIUM ) 200 MG tablet Take 1 tablet (200 mg total) by mouth 2 (two) times daily after a meal as needed. 6 tablet 1   prednisoLONE  acetate (PRED FORTE ) 1 % ophthalmic suspension Place 1 drop into both eyes 4 (four) times daily. 5 mL 0   predniSONE  (DELTASONE ) 10 MG tablet take 3 tabs by mouth a day for 3 days, then 2 tabs a day for 3 days, then 1 tab per day for 3 days 18 tablet 0   propranolol  (INDERAL ) 20 MG tablet Take 1.5 tablets (30 mg total) by mouth at bedtime. 90 tablet 1   rosuvastatin  (CRESTOR ) 40 MG tablet  Take 1 tablet (40 mg total) by mouth daily. 90 tablet 3   tirzepatide  (MOUNJARO ) 5 MG/0.5ML Pen Inject 5 mg into the skin once a week. 2 mL 3   tiZANidine  (ZANAFLEX ) 4 MG tablet Take 1 tablet (4 mg total) by mouth every 8 (eight) hours as needed for muscle spasms. 90 tablet 5   triamcinolone  (NASACORT ) 55 MCG/ACT AERO nasal inhaler Place 2 sprays into the nose daily. 16.9 mL 12   Current Facility-Administered Medications on File Prior to Visit  Medication Dose Route Frequency Provider Last Rate Last Admin   denosumab  (PROLIA ) injection 60 mg  60 mg Subcutaneous Once [name], MD       Allergies  Allergen Reactions   Lipitor [Atorvastatin]     REACTION: myalgias   Nitroglycerin    Family History  Problem Relation Age of Onset   Hypertension Father    Diabetes Father    Asthma Father    Cervical cancer Maternal Aunt    Heart disease Maternal Aunt    Anesthesia problems Neg Hx    Colon  cancer Neg Hx    Breast cancer Neg Hx    Esophageal cancer Neg Hx    Stomach cancer Neg Hx     Objective:   Physical Exam There were no vitals taken for this visit.  Wt Readings from Last 20 Encounters:  03/21/24 156 lb (70.8 kg)  01/20/24 149 lb (67.6 kg)  01/19/24 150 lb (68 kg)  12/13/23 147 lb (66.7 kg)  12/06/23 149 lb 9.6 oz (67.9 kg)  12/01/23 154 lb (69.9 kg)  10/19/23 153 lb (69.4 kg)  10/17/23 153 lb (69.4 kg)  09/16/23 140 lb (63.5 kg)  08/31/23 138 lb (62.6 kg)  07/13/23 147 lb (66.7 kg)  07/06/23 149 lb (67.6 kg)  06/01/23 146 lb (66.2 kg)  04/25/23 148 lb (67.1 kg)  04/19/23 145 lb (65.8 kg)  03/24/23 148 lb (67.1 kg)  02/02/23 144 lb (65.3 kg)  12/17/22 146 lb (66.2 kg)  12/08/22 145 lb (65.8 kg)  12/01/22 143 lb (64.9 kg)   Constitutional: normal weight, in NAD Eyes:  EOMI, no exophthalmos ENT: no neck masses, no cervical lymphadenopathy Cardiovascular: RRR, No MRG Respiratory: CTA B Musculoskeletal: no deformities Skin:no rashes Neurological: no tremor with outstretched hands  Assessment:     1. DM2, uncontrolled, insulin -dependent, with complications - mild nonproliferative diabetic retinopathy without macular edema, bilateral  - ? peripheral neuropathy  2. HL  3.  Overweight  4. PN  Plan:     1. Patient with history of uncontrolled type 2 diabetes, basal insulin , weekly GLP-1/GIP receptor agonist and sulfonylurea, with all doses increased at last visit due to very high blood sugars.  At that time, sugars are mostly in the 200s, despite improving her diet and exercising daily.  These hyperglycemic values could have been due to her steroid eyedrops or injecting insulin  into scar tissue.  She just finished the eyedrops and we discussed about how to inject the insulin  correctly and rotate the injection sites.  We also increased her Toujeo , glipizide , and the Mounjaro  dose.  I did advise her to let me know if the sugars did not improve, in which  case we would have needed mealtime insulin . - Of note, a Dexcom CGM was not approved for her.  At last visit she felt that the libre 3 CGM may be covered so I sent a prescription for this to her pharmacy. - I advised her to: Patient Instructions  Please continue: - Toujeo  36 units in a.m. - Glipizide  ER 5 mg before b'fast - Mounjaro  5 mg weekly  Please return in 3-4 months with your sugar log.   - we checked her HbA1c: 7%  - advised to check sugars at different times of the day - 4x a day, rotating check times - advised for yearly eye exams >> she is UTD - return to clinic in 3-4 months  2. HL - Latest lipid panel showed an LDL above target but much improved from baseline and the rest of the fractions were at goal: Lab Results  Component Value Date   CHOL 203 (H) 10/17/2023   HDL 57.70 10/17/2023   LDLCALC 115 (H) 10/17/2023   LDLDIRECT 170.0 04/07/2022   TRIG 150.0 (H) 10/17/2023   CHOLHDL 4 10/17/2023  - Continues on Crestor  40 mg daily without side effects  3.  Overweight - Previously on Ozempic , but currently on Mounjaro  which should also help with weight loss - Weight was approximately stable at the last 2 visits,  4. PN - Latest B12 level was normal: Lab Results  Component Value Date   VITAMINB12 680 10/17/2023  - Previously recommended alpha lipoic acid 600 mg twice a day but she did not start  Aveen Stansel, MD PhD [facility] Endocrinology

## 2024-04-04 ENCOUNTER — Other Ambulatory Visit (HOSPITAL_COMMUNITY): Payer: Self-pay

## 2024-04-04 ENCOUNTER — Other Ambulatory Visit: Payer: Self-pay

## 2024-04-04 MED ORDER — FREESTYLE LIBRE 3 PLUS SENSOR MISC
1.0000 | 3 refills | Status: AC
  Filled 2024-05-11 – 2024-05-18 (×2): qty 6, 90d supply, fill #0
  Filled 2024-06-07: qty 6, fill #0
  Filled 2024-06-12: qty 6, 90d supply, fill #0

## 2024-04-05 ENCOUNTER — Other Ambulatory Visit (HOSPITAL_COMMUNITY): Payer: Self-pay

## 2024-04-12 ENCOUNTER — Other Ambulatory Visit: Payer: Self-pay

## 2024-04-12 ENCOUNTER — Other Ambulatory Visit (HOSPITAL_COMMUNITY): Payer: Self-pay

## 2024-04-16 ENCOUNTER — Other Ambulatory Visit: Payer: Self-pay | Admitting: Internal Medicine

## 2024-04-16 ENCOUNTER — Other Ambulatory Visit (HOSPITAL_COMMUNITY): Payer: Self-pay

## 2024-04-16 NOTE — Telephone Encounter (Unsigned)
 Copied from CRM [id number]. Topic: Clinical - Medication Refill >> Apr 16, 2024  2:51 PM Rosina BIRCH wrote: Medication: butalbital -acetaminophen -caffeine   Has the patient contacted their pharmacy? Yes (Agent: If no, request that the patient contact the pharmacy for the refill. If patient does not wish to contact the pharmacy document the reason why and proceed with request.) (Agent: If yes, when and what did the pharmacy advise?)  This is the patient's preferred pharmacy:  [facility] [address], [address] [place] [address] Phone: [phone] Fax: [phone]  Is this the correct pharmacy for this prescription? Yes If no, delete pharmacy and type the correct one.   Has the prescription been filled recently? No  Is the patient out of the medication? Yes  Has the patient been seen for an appointment in the last year OR does the patient have an upcoming appointment? Yes  Can we respond through MyChart? Yes  Agent: Please be advised that Rx refills may take up to 3 business days. We ask that you follow-up with your pharmacy.

## 2024-04-18 ENCOUNTER — Other Ambulatory Visit (HOSPITAL_COMMUNITY): Payer: Self-pay

## 2024-04-18 MED ORDER — TOUJEO SOLOSTAR 300 UNIT/ML ~~LOC~~ SOPN
36.0000 [IU] | PEN_INJECTOR | Freq: Every day | SUBCUTANEOUS | 0 refills | Status: DC
  Filled 2024-04-18: qty 12, 94d supply, fill #0
  Filled 2024-06-07 – 2024-07-02 (×3): qty 12, 94d supply, fill #1

## 2024-04-18 NOTE — Telephone Encounter (Signed)
 Refill request complete

## 2024-04-20 ENCOUNTER — Encounter (HOSPITAL_COMMUNITY): Payer: Self-pay

## 2024-04-20 ENCOUNTER — Other Ambulatory Visit: Payer: Self-pay | Admitting: Internal Medicine

## 2024-04-20 ENCOUNTER — Other Ambulatory Visit (HOSPITAL_COMMUNITY): Payer: Self-pay

## 2024-04-20 MED ORDER — BUTALBITAL-APAP-CAFFEINE 50-325-40 MG PO TABS
1.0000 | ORAL_TABLET | Freq: Four times a day (QID) | ORAL | 3 refills | Status: DC | PRN
  Filled 2024-04-20: qty 30, 8d supply, fill #0
  Filled 2024-05-16: qty 30, 8d supply, fill #1
  Filled 2024-06-07: qty 30, 8d supply, fill #2

## 2024-04-26 ENCOUNTER — Other Ambulatory Visit (HOSPITAL_COMMUNITY): Payer: Self-pay

## 2024-05-04 ENCOUNTER — Telehealth: Payer: Self-pay | Admitting: Internal Medicine

## 2024-05-04 DIAGNOSIS — E559 Vitamin D deficiency, unspecified: Secondary | ICD-10-CM

## 2024-05-04 DIAGNOSIS — E538 Deficiency of other specified B group vitamins: Secondary | ICD-10-CM

## 2024-05-04 DIAGNOSIS — D509 Iron deficiency anemia, unspecified: Secondary | ICD-10-CM

## 2024-05-04 DIAGNOSIS — E1165 Type 2 diabetes mellitus with hyperglycemia: Secondary | ICD-10-CM

## 2024-05-04 NOTE — Progress Notes (Unsigned)
 [name] [address] [place] [address] Phone: [phone] Subjective:   Rachel [name], am serving as a scribe for Dr. Arthea Claudene.  I'm seeing this patient by the request  of:  [name], MD  CC: Back and neck pain follow-up  YEP:Dlagzrupcz  Rachel [name] is a 58 y.o. female coming in with complaint of back and neck pain. OMT on 03/21/2024. Patient states that she is having an increase in upper and lower back pain. Picking up granddaughter more recently.   Medications patient has been prescribed:   Taking:         Reviewed prior external information including notes and imaging from previsou exam, outside providers and external EMR if available.   As well as notes that were available from care everywhere and other healthcare systems.  Past medical history, social, surgical and family history all reviewed in electronic medical record.  No pertanent information unless stated regarding to the chief complaint.   Past Medical History:  Diagnosis Date   ALLERGIC RHINITIS 10/04/2007   Anemia 01/21/2011   Anxiety 11/15/2018   ASTHMA 08/02/2007   INHALERS ONLY IN [place]   Asthma 08/02/2007   Qualifier: Diagnosis of  By: [name]    Blood transfusion without reported diagnosis    with first child    Cervical radiculopathy 01/21/2011   Cervicogenic headache 04/27/2017   COMMON MIGRAINE 05/15/2009   Depression 01/09/2010   Qualifier: Diagnosis of  By: [name], [name]    DIABETES MELLITUS, TYPE II 08/02/2007   Dysphagia, pharyngoesophageal phase 06/12/2014   GERD 08/02/2007   HYPERLIPIDEMIA 08/02/2007   Hyperlipidemia 08/02/2007   Qualifier: Diagnosis of  By: [name]    INSOMNIA-SLEEP DISORDER-UNSPEC 01/04/2008   INTERMITTENT VERTIGO 05/15/2009   LIBIDO, DECREASED 01/09/2010   Migraine without aura 05/15/2009   Qualifier: Diagnosis of  By: [name], [name]    Nonallopathic lesion of rib cage 12/21/2017   Nonallopathic  lesion of sacral region 07/29/2015   Nonallopathic lesion of thoracic region 07/29/2015   Osteoporosis 04/25/2019   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   Rheumatoid factor positive 02/10/2016   Right knee pain 01/31/2019   Injected January 31, 2019   Type 2 diabetes mellitus with hyperglycemia, with long-term current use of insulin  (HCC) 08/02/2007   Qualifier: Diagnosis of  By: [name]     Allergies  Allergen Reactions   Lipitor [Atorvastatin]     REACTION: myalgias   Nitroglycerin      Review of Systems:  No headache, visual changes, nausea, vomiting, diarrhea, constipation, dizziness, abdominal pain, skin rash, fevers, chills, night sweats, weight loss, swollen lymph nodes, body aches, joint swelling, chest pain, shortness of breath, mood changes. POSITIVE muscle aches  Objective  There were no vitals taken for this visit.   General: No apparent distress alert and oriented x3 mood and affect normal, dressed appropriately.  HEENT: Pupils equal, extraocular movements intact  Respiratory: Patient's speak in full sentences and does not appear short of breath  Cardiovascular: No lower extremity edema, non tender, no erythema  Gait MSK:  Back   Osteopathic findings  C2 flexed rotated and side bent right C6 flexed rotated and side bent left T3 extended rotated and side bent right inhaled rib T9 extended rotated and side bent left L2 flexed rotated and side bent right Sacrum right on right       Assessment and Plan:  No problem-specific Assessment & Plan notes found for this encounter.    Nonallopathic problems  Decision today to treat with OMT was based on Physical Exam  After verbal consent patient was treated with HVLA, ME, FPR techniques in cervical, rib, thoracic, lumbar, and sacral  areas  Patient tolerated the procedure well with improvement in symptoms  Patient given exercises, stretches and lifestyle modifications  See medications in  patient instructions if given  Patient will follow up in 4-8 weeks     The above documentation has been reviewed and is accurate and complete Jimmi Sidener M Soren Lazarz, DO         Note: This dictation was prepared with Dragon dictation along with smaller phrase technology. Any transcriptional errors that result from this process are unintentional.

## 2024-05-04 NOTE — Telephone Encounter (Signed)
 Pt has upcoming CPE and scheduled a lab visit prior, can you enter orders for labs please?

## 2024-05-04 NOTE — Telephone Encounter (Signed)
 Ok labs are in , thanks

## 2024-05-09 ENCOUNTER — Ambulatory Visit: Admitting: Family Medicine

## 2024-05-09 VITALS — BP 112/78 | HR 100 | Ht 61.0 in | Wt 151.0 lb

## 2024-05-09 DIAGNOSIS — M9903 Segmental and somatic dysfunction of lumbar region: Secondary | ICD-10-CM

## 2024-05-09 DIAGNOSIS — M9902 Segmental and somatic dysfunction of thoracic region: Secondary | ICD-10-CM

## 2024-05-09 DIAGNOSIS — M4802 Spinal stenosis, cervical region: Secondary | ICD-10-CM | POA: Diagnosis not present

## 2024-05-09 DIAGNOSIS — M9901 Segmental and somatic dysfunction of cervical region: Secondary | ICD-10-CM

## 2024-05-09 DIAGNOSIS — M9904 Segmental and somatic dysfunction of sacral region: Secondary | ICD-10-CM

## 2024-05-09 DIAGNOSIS — M9908 Segmental and somatic dysfunction of rib cage: Secondary | ICD-10-CM

## 2024-05-09 MED ORDER — METHYLPREDNISOLONE ACETATE 80 MG/ML IJ SUSP
80.0000 mg | Freq: Once | INTRAMUSCULAR | Status: AC
  Administered 2024-05-09: 80 mg via INTRAMUSCULAR

## 2024-05-09 MED ORDER — KETOROLAC TROMETHAMINE 60 MG/2ML IM SOLN
60.0000 mg | Freq: Once | INTRAMUSCULAR | Status: AC
  Administered 2024-05-09: 60 mg via INTRAMUSCULAR

## 2024-05-09 NOTE — Patient Instructions (Signed)
 Good to see you. Happy Holidays. Full Cocktail injection in backside today. (NO NSAIDs for 24hrs). See me again in 2 to 3 months.

## 2024-05-10 ENCOUNTER — Encounter: Payer: Self-pay | Admitting: Family Medicine

## 2024-05-10 NOTE — Assessment & Plan Note (Signed)
 Patient has been slowly making some improvement overall.  Has had more tightness noted in the lumbar spine than even cervical spine.  Toradol  and Depo-Medrol  injections given today.  No other significant changes in medication.  Encouraged her to continue to work on core strength.  Continue to work on weight loss which patient has done very well.  Follow-up with me again in 6 to 12 weeks

## 2024-05-11 ENCOUNTER — Encounter: Payer: Self-pay | Admitting: Pharmacist

## 2024-05-11 ENCOUNTER — Other Ambulatory Visit (HOSPITAL_COMMUNITY): Payer: Self-pay

## 2024-05-11 ENCOUNTER — Other Ambulatory Visit: Payer: Self-pay

## 2024-05-16 ENCOUNTER — Other Ambulatory Visit: Payer: Self-pay

## 2024-05-16 ENCOUNTER — Other Ambulatory Visit (HOSPITAL_COMMUNITY): Payer: Self-pay

## 2024-05-17 ENCOUNTER — Other Ambulatory Visit (HOSPITAL_COMMUNITY): Payer: Self-pay

## 2024-05-17 ENCOUNTER — Other Ambulatory Visit: Payer: Self-pay

## 2024-05-17 NOTE — Progress Notes (Signed)
 Specialty Pharmacy Refill Coordination Note  Rachel [name] is a 58 y.o. female assessed today regarding refills of clinic administered specialty medication(s) OnabotulinumtoxinA  (Botox )   Clinic requested Courier to Provider Office   Delivery date: 05/28/24   Verified address: [facility] [address]   Medication will be filled on: 05/25/24  Appointment 06/01/24.

## 2024-05-18 ENCOUNTER — Other Ambulatory Visit (HOSPITAL_COMMUNITY): Payer: Self-pay

## 2024-05-18 ENCOUNTER — Other Ambulatory Visit: Payer: Self-pay

## 2024-05-24 ENCOUNTER — Other Ambulatory Visit (HOSPITAL_COMMUNITY): Payer: Self-pay

## 2024-05-24 ENCOUNTER — Encounter (HOSPITAL_COMMUNITY): Payer: Self-pay

## 2024-05-25 ENCOUNTER — Other Ambulatory Visit: Payer: Self-pay

## 2024-05-25 ENCOUNTER — Other Ambulatory Visit (HOSPITAL_COMMUNITY): Payer: Self-pay

## 2024-05-28 ENCOUNTER — Other Ambulatory Visit (HOSPITAL_COMMUNITY): Payer: Self-pay

## 2024-06-01 ENCOUNTER — Telehealth: Payer: Self-pay

## 2024-06-01 ENCOUNTER — Ambulatory Visit: Admitting: Neurology

## 2024-06-01 DIAGNOSIS — G43709 Chronic migraine without aura, not intractable, without status migrainosus: Secondary | ICD-10-CM | POA: Diagnosis not present

## 2024-06-01 MED ORDER — ONABOTULINUMTOXINA 100 UNITS IJ SOLR
200.0000 [IU] | Freq: Once | INTRAMUSCULAR | Status: AC
  Administered 2024-06-01: 155 [IU] via INTRAMUSCULAR

## 2024-06-01 NOTE — Telephone Encounter (Signed)
Will be on the look out for the fax

## 2024-06-01 NOTE — Telephone Encounter (Signed)
 Copied from CRM [id number]. Topic: General - Other >> Jun 01, 2024 12:07 PM Sasha M wrote: Reason for CRM: Leverette from [facility] called in to verify pt diagnosis for chronic condition. I could not verify and asked her for call back number at [phone] option 4 and I provided her the office fax number for provider to fill out paperwork and send back.

## 2024-06-01 NOTE — Progress Notes (Signed)

## 2024-06-04 ENCOUNTER — Other Ambulatory Visit

## 2024-06-04 ENCOUNTER — Telehealth: Payer: Self-pay

## 2024-06-04 NOTE — Telephone Encounter (Signed)
 Copied from CRM [id number]. Topic: General - Other >> Jun 04, 2024 10:31 AM Alfonso HERO wrote: Reason for CRM: Hosey from [facility] calling to see if fax was rcvd. They are trying to get the patient approved for their chronic condition program.

## 2024-06-04 NOTE — Telephone Encounter (Signed)
 Will be on the lookout for fax

## 2024-06-07 ENCOUNTER — Other Ambulatory Visit: Payer: Self-pay | Admitting: Family Medicine

## 2024-06-07 ENCOUNTER — Other Ambulatory Visit: Payer: Self-pay | Admitting: Internal Medicine

## 2024-06-07 ENCOUNTER — Other Ambulatory Visit (HOSPITAL_COMMUNITY): Payer: Self-pay

## 2024-06-08 ENCOUNTER — Other Ambulatory Visit (HOSPITAL_COMMUNITY): Payer: Self-pay

## 2024-06-08 ENCOUNTER — Encounter (HOSPITAL_COMMUNITY): Payer: Self-pay

## 2024-06-08 ENCOUNTER — Other Ambulatory Visit (HOSPITAL_BASED_OUTPATIENT_CLINIC_OR_DEPARTMENT_OTHER): Payer: Self-pay

## 2024-06-08 ENCOUNTER — Other Ambulatory Visit: Payer: Self-pay

## 2024-06-08 ENCOUNTER — Ambulatory Visit: Admitting: Family Medicine

## 2024-06-08 ENCOUNTER — Encounter: Payer: Self-pay | Admitting: Family Medicine

## 2024-06-08 ENCOUNTER — Other Ambulatory Visit: Payer: Self-pay | Admitting: Internal Medicine

## 2024-06-08 VITALS — BP 116/72 | HR 88 | Temp 98.9°F | Resp 17 | Ht 61.0 in | Wt 148.0 lb

## 2024-06-08 DIAGNOSIS — G44209 Tension-type headache, unspecified, not intractable: Secondary | ICD-10-CM | POA: Diagnosis not present

## 2024-06-08 DIAGNOSIS — J0140 Acute pansinusitis, unspecified: Secondary | ICD-10-CM

## 2024-06-08 DIAGNOSIS — R0982 Postnasal drip: Secondary | ICD-10-CM | POA: Diagnosis not present

## 2024-06-08 DIAGNOSIS — R6883 Chills (without fever): Secondary | ICD-10-CM | POA: Diagnosis not present

## 2024-06-08 DIAGNOSIS — J4521 Mild intermittent asthma with (acute) exacerbation: Secondary | ICD-10-CM

## 2024-06-08 DIAGNOSIS — R52 Pain, unspecified: Secondary | ICD-10-CM | POA: Diagnosis not present

## 2024-06-08 LAB — POCT INFLUENZA A/B
Influenza A, POC: NEGATIVE
Influenza B, POC: NEGATIVE

## 2024-06-08 LAB — POC COVID19 BINAXNOW: SARS Coronavirus 2 Ag: NEGATIVE

## 2024-06-08 MED ORDER — PREDNISONE 20 MG PO TABS
40.0000 mg | ORAL_TABLET | Freq: Every day | ORAL | 0 refills | Status: AC
  Filled 2024-06-08: qty 10, 5d supply, fill #0

## 2024-06-08 MED ORDER — AMOXICILLIN-POT CLAVULANATE 875-125 MG PO TABS
1.0000 | ORAL_TABLET | Freq: Two times a day (BID) | ORAL | 0 refills | Status: AC
  Filled 2024-06-08: qty 20, 10d supply, fill #0

## 2024-06-08 MED ORDER — ONDANSETRON HCL 4 MG PO TABS
4.0000 mg | ORAL_TABLET | Freq: Three times a day (TID) | ORAL | 1 refills | Status: DC | PRN
  Filled 2024-06-08: qty 40, 14d supply, fill #0

## 2024-06-08 MED ORDER — LINACLOTIDE 145 MCG PO CAPS
145.0000 ug | ORAL_CAPSULE | Freq: Every day | ORAL | 2 refills | Status: DC
  Filled 2024-06-08: qty 30, 30d supply, fill #0

## 2024-06-08 MED ORDER — ESOMEPRAZOLE MAGNESIUM 40 MG PO CPDR
40.0000 mg | DELAYED_RELEASE_CAPSULE | Freq: Two times a day (BID) | ORAL | 3 refills | Status: DC
  Filled 2024-06-08 – 2024-06-13 (×3): qty 180, 90d supply, fill #0
  Filled 2024-06-25: qty 60, 30d supply, fill #0

## 2024-06-08 NOTE — Progress Notes (Signed)
 Subjective:    Discussed the use of AI scribe software for clinical note transcription with the patient, who gave verbal consent to proceed.  History of Present Illness Rachel [name] is a 58 year old female with asthma who presents with fever, chills, and body aches.  Constitutional symptoms - Fever and chills for five days - Body aches present  Headache - Persistent headache for five days - Distinct from usual migraines - Localized to the top of the head and behind the right eye - Associated with difficulty keeping eyes open  Upper respiratory symptoms - Nasal congestion - Hoarse or altered voice  Cough and sputum production - Significant nocturnal cough - Cough productive of mucus - Post nasal drainage and spitting up small amounts of blood in sputum on two occasions - Sensation of mucus that cannot be cleared  Asthma and respiratory status - History of asthma - Wheezing, predominantly at night - Used inhaler last night - No shortness of breath or chest pain  Gastrointestinal symptoms - No nausea, vomiting, or diarrhea  Symptom management - Using ibuprofen  for symptom relief - Used Flonase  yesterday     ROS as in subjective.   Objective: Vitals:   06/08/24 1443  BP: 116/72  Pulse: 88  Resp: 17  Temp: 98.9 F (37.2 C)  SpO2: 99%    General appearance: Alert, WD/WN, no distress, mildly ill appearing                             Skin: warm, no rash                           Head: + frontal and maxillary sinus tenderness R>L                            Eyes: conjunctiva normal, corneas clear, PERRLA                            Ears: pearly TMs, external ear canals normal                          Nose: septum midline, turbinates swollen, with erythema and clear discharge             Mouth/throat: MMM, tongue normal, mild pharyngeal erythema                           Neck: supple, no adenopathy, no thyromegaly, nontender                           Heart: RRR                         Lungs: + mild exp wheezes, no rales, or rhonchi      Assessment/Plan:  Assessment and Plan Assessment & Plan Acute sinusitis Postnasal drainage, sinus pressure, and tenderness. Symptoms include nasal congestion, cough with occasional blood-tinged sputum, and sinus tenderness. Negative COVID and flu tests. Differential includes viral infection, but bacterial infection is considered if symptoms worsen or fever spikes. - Prescribed a 5-day course of prednisone . - Prescribed Augmentin  to be used only if symptoms worsen or fever spikes.  Mild intermittent asthma with acute  exacerbation Mild intermittent asthma with acute exacerbation. Symptoms include wheezing and chest pressure. She uses an inhaler for relief and has asthma. - Continue using inhaler as needed for asthma symptoms.  Acute tension-type headache Acute tension-type headache, distinct from her usual migraines. Pain is located on top of the head and inside the right eye, causing significant discomfort. - Continue ibuprofen  for headache relief.   Reports diabetes is under control with last A1c 6.5%

## 2024-06-09 MED ORDER — GABAPENTIN 100 MG PO CAPS
200.0000 mg | ORAL_CAPSULE | Freq: Every day | ORAL | 0 refills | Status: AC
  Filled 2024-06-09 – 2024-06-22 (×2): qty 180, 90d supply, fill #0

## 2024-06-10 ENCOUNTER — Other Ambulatory Visit: Payer: Self-pay

## 2024-06-10 ENCOUNTER — Other Ambulatory Visit (HOSPITAL_COMMUNITY): Payer: Self-pay

## 2024-06-11 ENCOUNTER — Encounter: Payer: Self-pay | Admitting: *Deleted

## 2024-06-11 NOTE — Progress Notes (Signed)
 Rachel [name]                                          MRN: [id number]   06/11/2024   The [facility] Quality Team Specialist reviewed this patient medical record for the purposes of chart review for care gap closure. The following were reviewed: chart review for care gap closure-glycemic status assessment.    [facility] Quality Team

## 2024-06-12 ENCOUNTER — Ambulatory Visit (INDEPENDENT_AMBULATORY_CARE_PROVIDER_SITE_OTHER): Admitting: Internal Medicine

## 2024-06-12 ENCOUNTER — Other Ambulatory Visit (HOSPITAL_COMMUNITY): Payer: Self-pay

## 2024-06-12 ENCOUNTER — Encounter: Payer: Self-pay | Admitting: Internal Medicine

## 2024-06-12 VITALS — BP 120/70 | HR 128 | Ht 61.0 in | Wt 149.2 lb

## 2024-06-12 DIAGNOSIS — E1165 Type 2 diabetes mellitus with hyperglycemia: Secondary | ICD-10-CM | POA: Diagnosis not present

## 2024-06-12 DIAGNOSIS — E782 Mixed hyperlipidemia: Secondary | ICD-10-CM

## 2024-06-12 DIAGNOSIS — Z794 Long term (current) use of insulin: Secondary | ICD-10-CM | POA: Diagnosis not present

## 2024-06-12 DIAGNOSIS — G63 Polyneuropathy in diseases classified elsewhere: Secondary | ICD-10-CM | POA: Diagnosis not present

## 2024-06-12 LAB — POCT GLYCOSYLATED HEMOGLOBIN (HGB A1C): Hemoglobin A1C: 7.8 % — AB (ref 4.0–5.6)

## 2024-06-12 NOTE — Progress Notes (Signed)
 Subjective:     Patient ID: Rachel [name], female   DOB: 1965-07-11, 58 y.o.   MRN: [id number]  HPI Ms. Rachel [name] is a  58 y.o. woman, returning for f/u for DM2, dx 1987, uncontrolled, insulin -dependent, with probable complications (? peripheral neuropathy, ?  Gastroparesis).  Last visit 6 months ago.  Interim history: No increased urination, blurry vision, chest pain.  She walks daily. She is eating healthy. No sweets.  Approximately 3 months ago she was in [place] and was not able to take her insulin .  Sugars increased to 400s and she felt poorly, having headaches and dizziness.  These improved after she started back on her insulin . She now has a URI >> initially started on steroids >> sugars in the 500s, had HAs. Now changed to Amoxicillin  yesterday.  Reviewed HbA1c levels: Lab Results  Component Value Date   HGBA1C 9.4 (H) 10/17/2023   HGBA1C 8.4 (A) 04/25/2023   HGBA1C 8.1 12/17/2022   HGBA1C 8.3 (H) 10/18/2022   HGBA1C 8.4 (A) 08/13/2022   HGBA1C 7.7 (H) 04/07/2022   HGBA1C 7.2 (A) 03/09/2022   HGBA1C 7.2 (A) 11/06/2021   HGBA1C 6.8 (A) 08/04/2021   HGBA1C 6.7 (A) 03/30/2021   HGBA1C 8.3 (H) 12/30/2020   HGBA1C 6.6 (A) 09/16/2020   HGBA1C 7.8 (A) 06/17/2020   HGBA1C 10.0 (H) 08/29/2019   HGBA1C 11.1 (A) 07/02/2019   HGBA1C 10.2 (H) 11/14/2018   HGBA1C 11.2 (H) 08/23/2018   HGBA1C 8.6 (A) 04/26/2018   HGBA1C 0 04/26/2018   HGBA1C 0 (A) 04/26/2018   HGBA1C 0.0 04/26/2018   HGBA1C 12.7 (A) 11/23/2017   HGBA1C 9.2 02/17/2017   HGBA1C 9.2 11/11/2016   HGBA1C 11.5 (H) 06/25/2016   HGBA1C 10.9 04/29/2015   HGBA1C 10.8 (H) 01/09/2015   HGBA1C 12.7 (H) 06/24/2014   HGBA1C 15.1 (H) 10/04/2013   HGBA1C 8.4 (H) 01/11/2013  02/17/2017: HbA1c calculated from fructosamine is better, at 8.34%, but still high. Prev. 10.1%.  Reviewed history: She came off all DM medicines for 6 mo before the HbA1c in 09/2013. She again ran out of all meds in 04/2016.  She is  on: - Lantus  40  >> ... Semglee  24 >> 30 >> 24-28 >> Toujeo  24-28 >> 30 >> 36 >> 40 (steroids) >> 24 units in a.m. - Ozempic  >> Mounjaro  2.5 mg weekly (started 05/02025) >> 5 mg weekly - Glipizide  ER 2.5 mg before b'fast (started 04/2023) >> 5 mg dail;y  Stopped Januvia  100 mg in am >> then Ozempic  0.5 mg weekly She is off Metformin  XR 1000 mg 2x a day with b'fast and dinner >> upset stomach >> stopped 04/2019 We tried Trulicity  1.5 mg weekly >> worked great but had to stop b/c GERD >> stopped. We tried Jardiance  25 mg daily >> started 08/2016 >> but stopped recently 2/2 recurrent UTIs She was on Glipizide  5 mg bid - added 04/2015 >> was not taking it b/c low CBGs. We stopped Glipizide  XL 5 mg in 07/2014. She had episodes of hypoglycemia with Amaryl  in the past. She was on Bydureon  2 mg weekly (had nausea). Previously on Humalog , then NovoLog  -started 12/2020  She checks her sugars >4 times a day - now has a CGM:  Prev.: - am: 87-140, 205 >> 60, 109-120, 189 >> 250-260s - after b'fast: 158-251 >> n/c  - before lunch: 75 (fasting, w/o insulin ) >> 99 >> n/c  - after lunch: up to 200s >> n/c >> 215 >> n/c  - before dinner:  137, 150 >> 140-150  >> n/c >> 220-230 - after dinner: 166-245 >> n/c - bedtime:  145-150s >> n/c >> 200 >> 103-125, 140 >> n/c She has hypoglycemia awareness in the 90s. Lowest: 38 (while on Trulicity ) >> .SABRA.  60 >> 200 >> 69. Highest 600 >> ...  215 >> 260 >> 500s.  She saw nutrition in the past.  -+ HL. Last lipids: Lab Results  Component Value Date   CHOL 203 (H) 10/17/2023   HDL 57.70 10/17/2023   LDLCALC 115 (H) 10/17/2023   LDLDIRECT 170.0 04/07/2022   TRIG 150.0 (H) 10/17/2023   CHOLHDL 4 10/17/2023  On Crestor  40 daily.  -No CKD: Lab Results  Component Value Date   BUN 12 10/17/2023   Lab Results  Component Value Date   CREATININE 0.68 10/17/2023   Lab Results  Component Value Date   MICRALBCREAT 6 12/06/2023   MICRALBCREAT 0.5 07/13/2010   MICRALBCREAT  3.6 07/07/2009   MICRALBCREAT 6.4 07/05/2008   MICRALBCREAT 9.7 09/29/2007   -+ Numbness and tingling in the right leg.  Last foot exam was on 12/06/2023  - Latest eye appointment was on 08/22/2023: No DR.  She had a gastric emptying study (02/03/2021) showed gastroparesis (but checked on Ozempic !). Started on Reglan , but not using it consistently. She has constipation - on Linzess . 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 She was dx'ed with OSA >> on CPAP.  She is on disability.  Review of Systems + see HPI  I reviewed pt's medications, allergies, PMH, social hx, family hx, and changes were documented in the history of present illness. Otherwise, unchanged from my initial visit note.  Past Medical History:  Diagnosis Date   ALLERGIC RHINITIS 10/04/2007   Anemia 01/21/2011   Anxiety 11/15/2018   ASTHMA 08/02/2007   INHALERS ONLY IN [place]   Asthma 08/02/2007   Qualifier: Diagnosis of  By: [name]    Blood transfusion without reported diagnosis    with first child    Cervical radiculopathy 01/21/2011   Cervicogenic headache 04/27/2017   COMMON MIGRAINE 05/15/2009   Depression 01/09/2010   Qualifier: Diagnosis of  By: [name], [name]    DIABETES MELLITUS, TYPE II 08/02/2007   Dysphagia, pharyngoesophageal phase 06/12/2014   GERD 08/02/2007   HYPERLIPIDEMIA 08/02/2007   Hyperlipidemia 08/02/2007   Qualifier: Diagnosis of  By: [name]    INSOMNIA-SLEEP DISORDER-UNSPEC 01/04/2008   INTERMITTENT VERTIGO 05/15/2009   LIBIDO, DECREASED 01/09/2010   Migraine without aura 05/15/2009   Qualifier: Diagnosis of  By: [name], [name]    Nonallopathic lesion of rib cage 12/21/2017   Nonallopathic lesion of sacral region 07/29/2015   Nonallopathic lesion of thoracic region 07/29/2015   Osteoporosis 04/25/2019   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   Rheumatoid factor positive 02/10/2016   Right knee pain 01/31/2019   Injected January 31, 2019   Type 2 diabetes mellitus  with hyperglycemia, with long-term current use of insulin  (HCC) 08/02/2007   Qualifier: Diagnosis of  By: [name]    Past Surgical History:  Procedure Laterality Date   CESAREAN SECTION     x 3   CYST EXCISION     left knee   PAROTIDECTOMY  10/21/2011   Procedure: PAROTIDECTOMY;  Surgeon: Vaughan Ricker, MD;  Location: [facility] OR;  Service: ENT;  Laterality: Left;   Social History   Socioeconomic History   Marital status: Married  Spouse name: Curlee   Number of children: 3   Years of education: Degree   Highest education level: Associate degree: occupational, scientist, product/process development, or vocational program  Occupational History   Occupation: Curator: [employer]   Occupation: PSYCHOLOGIST, SPORT AND EXERCISE    Employer: [employer]  Tobacco Use   Smoking status: Never    Passive exposure: Never   Smokeless tobacco: Never  Vaping Use   Vaping status: Never Used  Substance and Sexual Activity   Alcohol use: No    Alcohol/week: 0.0 standard drinks of alcohol   Drug use: No   Sexual activity: Not on file  Other Topics Concern   Not on file  Social History Narrative   She has 3 daughters.   Patient has a 4 year degree.    Patient working at [employer].    Patient is married to [place].   Social Drivers of Health   Tobacco Use: Low Risk (06/08/2024)   Patient History    Smoking Tobacco Use: Never    Smokeless Tobacco Use: Never    Passive Exposure: Never  Financial Resource Strain: Low Risk (09/16/2023)   Overall Financial Resource Strain (CARDIA)    Difficulty of Paying Living Expenses: Not hard at all  Food Insecurity: No Food Insecurity (09/16/2023)   Hunger Vital Sign    Worried About Running Out of Food in the Last Year: Never true    Ran Out of Food in the Last Year: Never true  Transportation Needs: No Transportation Needs (09/16/2023)   PRAPARE - Administrator, Civil Service (Medical): No    Lack of Transportation (Non-Medical): No  Physical Activity:  Insufficiently Active (09/16/2023)   Exercise Vital Sign    Days of Exercise per Week: 3 days    Minutes of Exercise per Session: 30 min  Stress: No Stress Concern Present (09/16/2023)   [employer] of Occupational Health - Occupational Stress Questionnaire    Feeling of Stress : Not at all  Social Connections: Socially Integrated (09/16/2023)   Social Connection and Isolation Panel    Frequency of Communication with Friends and Family: More than three times a week    Frequency of Social Gatherings with Friends and Family: More than three times a week    Attends Religious Services: More than 4 times per year    Active Member of [employer] or Organizations: Yes    Attends Banker Meetings: More than 4 times per year    Marital Status: Married  Catering Manager Violence: Not At Risk (09/16/2023)   Humiliation, Afraid, Rape, and Kick questionnaire    Fear of Current or Ex-Partner: No    Emotionally Abused: No    Physically Abused: No    Sexually Abused: No  Depression (PHQ2-9): Low Risk (10/17/2023)   Depression (PHQ2-9)    PHQ-2 Score: 0  Alcohol Screen: Low Risk (09/16/2023)   Alcohol Screen    Last Alcohol Screening Score (AUDIT): 0  Housing: Unknown (09/16/2023)   Housing Stability Vital Sign    Unable to Pay for Housing in the Last Year: No    Number of Times Moved in the Last Year: Not on file    Homeless in the Last Year: No  Utilities: Not At Risk (09/16/2023)   AHC Utilities    Threatened with loss of utilities: No  Health Literacy: Adequate Health Literacy (09/16/2023)   B1300 Health Literacy    Frequency of need for help with medical  instructions: Never   Current Outpatient Medications on File Prior to Visit  Medication Sig Dispense Refill   Accu-Chek FastClix Lancets MISC Check blood glucose twice daily 204 each 3   amoxicillin -clavulanate (AUGMENTIN ) 875-125 MG tablet Take 1 tablet by mouth 2 (two) times daily for 10 days. 20 tablet 0   aspirin  81 MG EC tablet Take 81  mg by mouth daily.     Blood Glucose Monitoring Suppl (ACCU-CHEK GUIDE) w/Device KIT Use as instructed to check blood sugar 2 times daily 1 kit 0   botulinum toxin Type A  (BOTOX ) 200 units injection Inject 155 units IM Into multiple sites in the face,neck,and head every 90 days 1 each 4   butalbital -acetaminophen -caffeine  (FIORICET ) 50-325-40 MG tablet Take 1 tablet by mouth every 6 (six) hours as needed for headache. 30 tablet 3   carbamazepine  (TEGRETOL ) 200 MG tablet TAKE 1 TABLET TWICE DAILY FOR ONE WEEK, THEN INCREASE TO 1 TABLET THREE TIMES DAILY. 90 tablet 3   Cholecalciferol  (VITAMIN D3) 50 MCG (2000 UT) capsule Take 2 capsules (4,000 Units total) by mouth daily. 180 capsule 3   Continuous Glucose Sensor (FREESTYLE LIBRE 3 PLUS SENSOR) MISC Use to monitor blood sugar continuously, changing sensor every 15 days. 6 each 3   cycloSPORINE  (RESTASIS ) 0.05 % ophthalmic emulsion Instill 1 drop into both eyes twice a day 180 each 3   cycloSPORINE  (RESTASIS ) 0.05 % ophthalmic emulsion Place 1 drop into both eyes 2 (two) times daily. 60 each 6   Diclofenac  Sodium (PENNSAID ) 2 % SOLN Place 2 g onto the skin 2 (two) times daily. 112 g 3   DULoxetine  (CYMBALTA ) 60 MG capsule Take 1 capsule (60 mg total) by mouth daily. 90 capsule 3   esomeprazole  (NEXIUM ) 40 MG capsule Take 1 capsule (40 mg total) by mouth 2 (two) times daily before a meal. 180 capsule 3   fluorometholone  (FML) 0.1 % ophthalmic suspension Place 1 drop into both eyes 4 (four) times daily. 5 mL 0   Fluticasone -Umeclidin-Vilant (TRELEGY ELLIPTA ) 100-62.5-25 MCG/ACT AEPB Inhale 1 puff into the lungs daily. 60 each 11   gabapentin  (NEURONTIN ) 100 MG capsule Take 2 capsules (200 mg total) by mouth at bedtime. 180 capsule 0   glipiZIDE  (GLUCOTROL  XL) 2.5 MG 24 hr tablet Take 2 tablets (5 mg total) by mouth daily with breakfast. 180 tablet 3   glucose blood (ACCU-CHEK GUIDE) test strip Use to check blood sugar 2 (two) times daily as directed 200  each 3   ibuprofen  (ADVIL ) 800 MG tablet Take 1 tablet (800 mg total) by mouth every 6 (six) hours as needed for pain. 16 tablet 0   insulin  glargine, 1 Unit Dial , (TOUJEO  SOLOSTAR) 300 UNIT/ML Solostar Pen Inject 36-38 Units into the skin daily. 15 mL 0   linaclotide  (LINZESS ) 145 MCG CAPS capsule Take 1 capsule (145 mcg total) by mouth daily before breakfast 30 capsule 2   metoCLOPramide  (REGLAN ) 5 MG tablet Take 1 tablet (5 mg total) by mouth 3 (three) times daily 30 minutes before meals 90 tablet 1   montelukast  (SINGULAIR ) 10 MG tablet Take 1 tablet (10 mg total) by mouth daily. 90 tablet 3   Multiple Vitamin (MULTIVITAMIN WITH MINERALS) TABS tablet Take 1 tablet by mouth daily.     neomycin -polymyxin-hydrocortisone (CORTISPORIN ) OTIC solution Place 4 drops into the left ear 4 (four) times daily. 10 mL 0   nitrofurantoin , macrocrystal-monohydrate, (MACROBID ) 100 MG capsule Take 1 capsule (100 mg total) by mouth every 12 (twelve) hours  with food. 14 capsule 0   nystatin -triamcinolone  ointment (MYCOLOG) Apply 1 Application topically 2 (two) times daily to affected area(s). 30 g 11   ondansetron  (ZOFRAN  ODT) 8 MG disintegrating tablet Take 1 tablet (8 mg total) by mouth every 8 (eight) hours as needed for nausea or vomiting. 30 tablet 0   ondansetron  (ZOFRAN ) 4 MG tablet Take 1 tablet (4 mg total) by mouth every 8 (eight) hours as needed for nausea and vomiting 40 tablet 1   phenazopyridine  (PYRIDIUM ) 200 MG tablet Take 1 tablet (200 mg total) by mouth 2 (two) times daily after a meal as needed. 6 tablet 1   prednisoLONE  acetate (PRED FORTE ) 1 % ophthalmic suspension Place 1 drop into both eyes 4 (four) times daily. 5 mL 0   predniSONE  (DELTASONE ) 20 MG tablet Take 2 tablets (40 mg total) by mouth daily with breakfast for 5 days. 10 tablet 0   propranolol  (INDERAL ) 20 MG tablet Take 1.5 tablets (30 mg total) by mouth at bedtime. 90 tablet 1   rosuvastatin  (CRESTOR ) 40 MG tablet Take 1 tablet (40 mg  total) by mouth daily. 90 tablet 3   tirzepatide  (MOUNJARO ) 5 MG/0.5ML Pen Inject 5 mg into the skin once a week. 2 mL 3   tiZANidine  (ZANAFLEX ) 4 MG tablet Take 1 tablet (4 mg total) by mouth every 8 (eight) hours as needed for muscle spasms. 90 tablet 5   triamcinolone  (NASACORT ) 55 MCG/ACT AERO nasal inhaler Place 2 sprays into the nose daily. 16.9 mL 12   Current Facility-Administered Medications on File Prior to Visit  Medication Dose Route Frequency Provider Last Rate Last Admin   denosumab  (PROLIA ) injection 60 mg  60 mg Subcutaneous Once [name], MD       Allergies  Allergen Reactions   Lipitor [Atorvastatin]     REACTION: myalgias   Nitroglycerin    Family History  Problem Relation Age of Onset   Hypertension Father    Diabetes Father    Asthma Father    Cervical cancer Maternal Aunt    Heart disease Maternal Aunt    Anesthesia problems Neg Hx    Colon cancer Neg Hx    Breast cancer Neg Hx    Esophageal cancer Neg Hx    Stomach cancer Neg Hx     Objective:   Physical Exam There were no vitals taken for this visit.  Wt Readings from Last 20 Encounters:  06/08/24 148 lb (67.1 kg)  05/09/24 151 lb (68.5 kg)  03/21/24 156 lb (70.8 kg)  01/20/24 149 lb (67.6 kg)  01/19/24 150 lb (68 kg)  12/13/23 147 lb (66.7 kg)  12/06/23 149 lb 9.6 oz (67.9 kg)  12/01/23 154 lb (69.9 kg)  10/19/23 153 lb (69.4 kg)  10/17/23 153 lb (69.4 kg)  09/16/23 140 lb (63.5 kg)  08/31/23 138 lb (62.6 kg)  07/13/23 147 lb (66.7 kg)  07/06/23 149 lb (67.6 kg)  06/01/23 146 lb (66.2 kg)  04/25/23 148 lb (67.1 kg)  04/19/23 145 lb (65.8 kg)  03/24/23 148 lb (67.1 kg)  02/02/23 144 lb (65.3 kg)  12/17/22 146 lb (66.2 kg)   Constitutional: normal weight, in NAD Eyes:  EOMI, no exophthalmos ENT: no neck masses, no cervical lymphadenopathy Cardiovascular: RRR, No MRG Respiratory: CTA B Musculoskeletal: no deformities Skin:no rashes Neurological: no tremor with outstretched  hands  Assessment:     1. DM2, uncontrolled, insulin -dependent, with probable complications - ? peripheral neuropathy  2. HL  3.  Overweight  4. PN  Plan:     1. Patient with history of uncontrolled type 2 diabetes, on basal insulin , sulfonylurea, and weekly GLP-1/GIP receptor agonist, dose increased at last visit.  Were not able to start her on a sensor as a Dexcom was not approved for her.  I did send a prescription for the freestyle libre 3 to her pharmacy as she mentions that she had better insurance. - At last visit, sugars were quite high, mostly in the 200s.  She was not able to explain this especially as she improved her diet and was eating a lot of vegetables and not eating sweets along with exercising daily.  She just finished steroid eyedrops which could have contributed to the high blood sugars and we also discussed that injecting insulin  into scar tissue can decrease absorption.  I advised her to help to rotate the injection sites.  We also discussed about increasing all 3 medication doses and let me know if the sugars did not improve afterwards, in which case I was preparing to add mealtime insulin .  She mentions that sugars did improve after our last visit.  She was also able to start on a CGM. CGM interpretation: -At today's visit, we reviewed her CGM downloads: It appears that 62% of values are in target range (goal >70%), while 38% are higher than 180 (goal <25%), and 0% are lower than 70 (goal <4%).  The calculated average blood sugar is 178.  The projected HbA1c for the next 3 months (GMI) is 7.6%. -Reviewing the CGM trends, sugars appear to have improved since last visit, however, she has an upper respiratory infection recently and feeling poorly.  Moreover, she was started on steroids recently and sugars increased to 500s.  She switched to using antibiotics yesterday as she did not want to continue with prednisone .  Sugars started to improve.  She is feeling better. -She was  on a higher dose of Toujeo  while on thyroid  but decrease the dose to 24 units today.  Prior to starting steroids, she was on 24 units and her sugars were at target or slightly high.  At today's visit I advised her to increase the Toujeo  dose slightly, but for now we will continue the rest of the regimen.  I will see her back in 3 months and if sugars do not improve, we may need to increase Mounjaro . - I advised her to: Patient Instructions  Please use: - Toujeo  26-28 units in a.m.  Continue: - Glipizide  ER 5 mg before b'fast - Mounjaro  5 mg weekly  Please return in 3 months.  - we checked her HbA1c: 7.8% (lower) - advised to check sugars at different times of the day - 4x a day, rotating check times - advised for yearly eye exams >> she is UTD - return to clinic in 3 months  2. HL - Latest lipid panel was reviewed from 10/2023 and LDL above target was much improved from before: Lab Results  Component Value Date   CHOL 203 (H) 10/17/2023   HDL 57.70 10/17/2023   LDLCALC 115 (H) 10/17/2023   LDLDIRECT 170.0 04/07/2022   TRIG 150.0 (H) 10/17/2023   CHOLHDL 4 10/17/2023  - She continues Crestor  40 mg daily without side effects  3.  Overweight -Currently on Mounjaro , dose increased at last visit - Weight was approximately stable at the last 2 visits  4. PN -B12 level was normal at last check: Lab Results  Component Value Date   VITAMINB12 680  10/17/2023  -I recommended alpha lipoic acid 600 mg twice a day previously, but she did not start  Deitrich Steve, MD PhD [facility]

## 2024-06-12 NOTE — Patient Instructions (Addendum)
 Please use: - Toujeo  26-28 units in a.m.  Continue: - Glipizide  ER 5 mg before b'fast - Mounjaro  5 mg weekly  Please return in 3 months.

## 2024-06-12 NOTE — Addendum Note (Signed)
 Addended by: CLEOTILDE ROLIN RAMAN on: 06/12/2024 03:38 PM   Modules accepted: Orders

## 2024-06-13 ENCOUNTER — Other Ambulatory Visit (HOSPITAL_COMMUNITY): Payer: Self-pay

## 2024-06-22 ENCOUNTER — Other Ambulatory Visit (HOSPITAL_COMMUNITY): Payer: Self-pay

## 2024-06-25 ENCOUNTER — Other Ambulatory Visit (HOSPITAL_COMMUNITY): Payer: Self-pay

## 2024-06-25 ENCOUNTER — Other Ambulatory Visit: Payer: Self-pay

## 2024-06-25 NOTE — Progress Notes (Signed)
 Rachel [name]                                          MRN: [id number]   06/25/2024   The [facility] Quality Team Specialist reviewed this patient medical record for the purposes of chart review for care gap closure. The following were reviewed: abstraction for care gap closure-glycemic status assessment.    [facility]I Quality Team

## 2024-07-02 ENCOUNTER — Telehealth (HOSPITAL_COMMUNITY): Payer: Self-pay | Admitting: Pharmacist

## 2024-07-02 ENCOUNTER — Other Ambulatory Visit: Payer: Self-pay | Admitting: Internal Medicine

## 2024-07-02 ENCOUNTER — Other Ambulatory Visit: Payer: Self-pay

## 2024-07-02 ENCOUNTER — Other Ambulatory Visit (HOSPITAL_COMMUNITY): Payer: Self-pay

## 2024-07-02 MED ORDER — TOUJEO SOLOSTAR 300 UNIT/ML ~~LOC~~ SOPN
36.0000 [IU] | PEN_INJECTOR | Freq: Every day | SUBCUTANEOUS | 3 refills | Status: AC
  Filled 2024-07-02: qty 12, 94d supply, fill #0

## 2024-07-02 MED ORDER — PROPRANOLOL HCL 20 MG PO TABS
30.0000 mg | ORAL_TABLET | Freq: Every day | ORAL | 1 refills | Status: DC
  Filled 2024-07-02: qty 90, 60d supply, fill #0

## 2024-07-02 NOTE — Telephone Encounter (Signed)
 Diagnosis code needed for claim to go through. No PA needed at this time.

## 2024-07-03 LAB — OPHTHALMOLOGY REPORT-SCANNED

## 2024-07-05 ENCOUNTER — Other Ambulatory Visit

## 2024-07-05 DIAGNOSIS — Z794 Long term (current) use of insulin: Secondary | ICD-10-CM | POA: Diagnosis not present

## 2024-07-05 DIAGNOSIS — E538 Deficiency of other specified B group vitamins: Secondary | ICD-10-CM

## 2024-07-05 DIAGNOSIS — D509 Iron deficiency anemia, unspecified: Secondary | ICD-10-CM

## 2024-07-05 DIAGNOSIS — E1165 Type 2 diabetes mellitus with hyperglycemia: Secondary | ICD-10-CM

## 2024-07-05 DIAGNOSIS — E559 Vitamin D deficiency, unspecified: Secondary | ICD-10-CM | POA: Diagnosis not present

## 2024-07-05 LAB — URINALYSIS, ROUTINE W REFLEX MICROSCOPIC
Bilirubin Urine: NEGATIVE
Hgb urine dipstick: NEGATIVE
Leukocytes,Ua: NEGATIVE
Nitrite: NEGATIVE
RBC / HPF: NONE SEEN
Specific Gravity, Urine: 1.02 (ref 1.000–1.030)
Total Protein, Urine: NEGATIVE
Urine Glucose: 100 — AB
Urobilinogen, UA: 0.2 (ref 0.0–1.0)
WBC, UA: NONE SEEN
pH: 6 (ref 5.0–8.0)

## 2024-07-05 LAB — CBC WITH DIFFERENTIAL/PLATELET
Basophils Absolute: 0.1 K/uL (ref 0.0–0.1)
Basophils Relative: 0.6 % (ref 0.0–3.0)
Eosinophils Absolute: 0.1 K/uL (ref 0.0–0.7)
Eosinophils Relative: 1 % (ref 0.0–5.0)
HCT: 35.1 % — ABNORMAL LOW (ref 36.0–46.0)
Hemoglobin: 12.1 g/dL (ref 12.0–15.0)
Lymphocytes Relative: 54.7 % — ABNORMAL HIGH (ref 12.0–46.0)
Lymphs Abs: 4.6 K/uL — ABNORMAL HIGH (ref 0.7–4.0)
MCHC: 34.3 g/dL (ref 30.0–36.0)
MCV: 84 fl (ref 78.0–100.0)
Monocytes Absolute: 0.7 K/uL (ref 0.1–1.0)
Monocytes Relative: 8.7 % (ref 3.0–12.0)
Neutro Abs: 2.9 K/uL (ref 1.4–7.7)
Neutrophils Relative %: 35 % — ABNORMAL LOW (ref 43.0–77.0)
Platelets: 332 K/uL (ref 150.0–400.0)
RBC: 4.18 Mil/uL (ref 3.87–5.11)
RDW: 13.1 % (ref 11.5–15.5)
WBC: 8.4 K/uL (ref 4.0–10.5)

## 2024-07-05 LAB — HEPATIC FUNCTION PANEL
ALT: 13 U/L (ref 3–35)
AST: 13 U/L (ref 5–37)
Albumin: 4.3 g/dL (ref 3.5–5.2)
Alkaline Phosphatase: 59 U/L (ref 39–117)
Bilirubin, Direct: 0 mg/dL — ABNORMAL LOW (ref 0.1–0.3)
Total Bilirubin: 0.3 mg/dL (ref 0.2–1.2)
Total Protein: 6.9 g/dL (ref 6.0–8.3)

## 2024-07-05 LAB — LIPID PANEL
Cholesterol: 205 mg/dL — ABNORMAL HIGH (ref 28–200)
HDL: 62.6 mg/dL
LDL Cholesterol: 113 mg/dL — ABNORMAL HIGH (ref 10–99)
NonHDL: 142.48
Total CHOL/HDL Ratio: 3
Triglycerides: 146 mg/dL (ref 10.0–149.0)
VLDL: 29.2 mg/dL (ref 0.0–40.0)

## 2024-07-05 LAB — BASIC METABOLIC PANEL WITH GFR
BUN: 13 mg/dL (ref 6–23)
CO2: 27 meq/L (ref 19–32)
Calcium: 9 mg/dL (ref 8.4–10.5)
Chloride: 102 meq/L (ref 96–112)
Creatinine, Ser: 0.71 mg/dL (ref 0.40–1.20)
GFR: 92.61 mL/min
Glucose, Bld: 170 mg/dL — ABNORMAL HIGH (ref 70–99)
Potassium: 3.9 meq/L (ref 3.5–5.1)
Sodium: 136 meq/L (ref 135–145)

## 2024-07-05 LAB — MICROALBUMIN / CREATININE URINE RATIO
Creatinine,U: 185.2 mg/dL
Microalb Creat Ratio: 5.6 mg/g (ref 0.0–30.0)
Microalb, Ur: 1 mg/dL (ref 0.7–1.9)

## 2024-07-05 LAB — IBC PANEL
Iron: 59 ug/dL (ref 42–145)
Saturation Ratios: 16 % — ABNORMAL LOW (ref 20.0–50.0)
TIBC: 368.2 ug/dL (ref 250.0–450.0)
Transferrin: 263 mg/dL (ref 212.0–360.0)

## 2024-07-05 LAB — FERRITIN: Ferritin: 9.8 ng/mL — ABNORMAL LOW (ref 10.0–291.0)

## 2024-07-05 LAB — VITAMIN B12: Vitamin B-12: 446 pg/mL (ref 211–911)

## 2024-07-05 LAB — VITAMIN D 25 HYDROXY (VIT D DEFICIENCY, FRACTURES): VITD: 49.49 ng/mL (ref 30.00–100.00)

## 2024-07-05 LAB — TSH: TSH: 1.55 u[IU]/mL (ref 0.35–5.50)

## 2024-07-05 LAB — HEMOGLOBIN A1C: Hgb A1c MFr Bld: 8.1 % — ABNORMAL HIGH (ref 4.6–6.5)

## 2024-07-05 NOTE — Progress Notes (Unsigned)
 " [name] [address] [place] [address] Phone: [phone] Subjective:    I'm seeing this patient by the request  of:  [name], MD  CC:   YEP:[id number]  Rachel [name] is a 59 y.o. female coming in with complaint of back and neck pain. OMT 05/09/2024. Patient states   Medications patient has been prescribed: Gabapentin   Taking:         Reviewed prior external information including notes and imaging from previsou exam, outside providers and external EMR if available.   As well as notes that were available from care everywhere and other healthcare systems.  Past medical history, social, surgical and family history all reviewed in electronic medical record.  No pertanent information unless stated regarding to the chief complaint.   Past Medical History:  Diagnosis Date   ALLERGIC RHINITIS 10/04/2007   Anemia 01/21/2011   Anxiety 11/15/2018   ASTHMA 08/02/2007   INHALERS ONLY IN [place]   Asthma 08/02/2007   Qualifier: Diagnosis of  By: [name]    Blood transfusion without reported diagnosis    with first child    Cervical radiculopathy 01/21/2011   Cervicogenic headache 04/27/2017   COMMON MIGRAINE 05/15/2009   Depression 01/09/2010   Qualifier: Diagnosis of  By: [name], [name]    DIABETES MELLITUS, TYPE II 08/02/2007   Dysphagia, pharyngoesophageal phase 06/12/2014   GERD 08/02/2007   HYPERLIPIDEMIA 08/02/2007   Hyperlipidemia 08/02/2007   Qualifier: Diagnosis of  By: [name]    INSOMNIA-SLEEP DISORDER-UNSPEC 01/04/2008   INTERMITTENT VERTIGO 05/15/2009   LIBIDO, DECREASED 01/09/2010   Migraine without aura 05/15/2009   Qualifier: Diagnosis of  By: [name], [name]    Nonallopathic lesion of rib cage 12/21/2017   Nonallopathic lesion of sacral region 07/29/2015   Nonallopathic lesion of thoracic region 07/29/2015   Osteoporosis 04/25/2019   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   Rheumatoid factor  positive 02/10/2016   Right knee pain 01/31/2019   Injected January 31, 2019   Type 2 diabetes mellitus with hyperglycemia, with long-term current use of insulin  (HCC) 08/02/2007   Qualifier: Diagnosis of  By: [name]     Allergies[1]   Review of Systems:  No headache, visual changes, nausea, vomiting, diarrhea, constipation, dizziness, abdominal pain, skin rash, fevers, chills, night sweats, weight loss, swollen lymph nodes, body aches, joint swelling, chest pain, shortness of breath, mood changes. POSITIVE muscle aches  Objective  There were no vitals taken for this visit.   General: No apparent distress alert and oriented x3 mood and affect normal, dressed appropriately.  HEENT: Pupils equal, extraocular movements intact  Respiratory: Patient's speak in full sentences and does not appear short of breath  Cardiovascular: No lower extremity edema, non tender, no erythema  Gait MSK:  Back   Osteopathic findings  C2 flexed rotated and side bent right C6 flexed rotated and side bent left T3 extended rotated and side bent right inhaled rib T9 extended rotated and side bent left L2 flexed rotated and side bent right Sacrum right on right       Assessment and Plan:  No problem-specific Assessment & Plan notes found for this encounter.    Nonallopathic problems  Decision today to treat with OMT was based on Physical Exam  After verbal consent patient was treated with HVLA, ME, FPR techniques in cervical, rib, thoracic, lumbar, and sacral  areas  Patient tolerated the  procedure well with improvement in symptoms  Patient given exercises, stretches and lifestyle modifications  See medications in patient instructions if given  Patient will follow up in 4-8 weeks             Note: This dictation was prepared with Dragon dictation along with smaller phrase technology. Any transcriptional errors that result from this process are unintentional.             [1]  Allergies Allergen Reactions   Lipitor [Atorvastatin]     REACTION: myalgias   Nitroglycerin    "

## 2024-07-09 ENCOUNTER — Ambulatory Visit: Admitting: Family Medicine

## 2024-07-10 ENCOUNTER — Other Ambulatory Visit (HOSPITAL_COMMUNITY): Payer: Self-pay

## 2024-07-10 ENCOUNTER — Telehealth: Payer: Self-pay | Admitting: Neurology

## 2024-07-10 ENCOUNTER — Telehealth: Payer: Self-pay

## 2024-07-10 NOTE — Telephone Encounter (Signed)
 ERROR

## 2024-07-10 NOTE — Telephone Encounter (Signed)
 Rachel [name] called and lvm regarding her Botox . Ins is needing paper work filled out and signed by provider and sent back. She is due for Botox  on 08/02/2024.   PH: [phone]

## 2024-07-12 ENCOUNTER — Ambulatory Visit: Admitting: Internal Medicine

## 2024-07-12 ENCOUNTER — Other Ambulatory Visit (HOSPITAL_COMMUNITY): Payer: Self-pay

## 2024-07-12 VITALS — BP 118/80 | HR 105 | Temp 98.4°F | Ht 61.0 in | Wt 148.0 lb

## 2024-07-12 DIAGNOSIS — Z7985 Long-term (current) use of injectable non-insulin antidiabetic drugs: Secondary | ICD-10-CM

## 2024-07-12 DIAGNOSIS — E782 Mixed hyperlipidemia: Secondary | ICD-10-CM

## 2024-07-12 DIAGNOSIS — E611 Iron deficiency: Secondary | ICD-10-CM | POA: Diagnosis not present

## 2024-07-12 DIAGNOSIS — J4520 Mild intermittent asthma, uncomplicated: Secondary | ICD-10-CM

## 2024-07-12 DIAGNOSIS — Z794 Long term (current) use of insulin: Secondary | ICD-10-CM | POA: Diagnosis not present

## 2024-07-12 DIAGNOSIS — E1165 Type 2 diabetes mellitus with hyperglycemia: Secondary | ICD-10-CM

## 2024-07-12 DIAGNOSIS — E559 Vitamin D deficiency, unspecified: Secondary | ICD-10-CM | POA: Diagnosis not present

## 2024-07-12 MED ORDER — ESOMEPRAZOLE MAGNESIUM 40 MG PO CPDR
40.0000 mg | DELAYED_RELEASE_CAPSULE | Freq: Two times a day (BID) | ORAL | 3 refills | Status: DC
  Filled 2024-07-12: qty 180, 90d supply, fill #0

## 2024-07-12 MED ORDER — LINACLOTIDE 145 MCG PO CAPS
145.0000 ug | ORAL_CAPSULE | Freq: Every day | ORAL | 2 refills | Status: DC
  Filled 2024-07-12: qty 30, 30d supply, fill #0

## 2024-07-12 MED ORDER — DULOXETINE HCL 60 MG PO CPEP
60.0000 mg | ORAL_CAPSULE | Freq: Every day | ORAL | 3 refills | Status: DC
  Filled 2024-07-12: qty 90, 90d supply, fill #0

## 2024-07-12 MED ORDER — MONTELUKAST SODIUM 10 MG PO TABS
10.0000 mg | ORAL_TABLET | Freq: Every day | ORAL | 3 refills | Status: DC
  Filled 2024-07-12: qty 90, 90d supply, fill #0

## 2024-07-12 MED ORDER — ATORVASTATIN CALCIUM 80 MG PO TABS
80.0000 mg | ORAL_TABLET | Freq: Every day | ORAL | 3 refills | Status: DC
  Filled 2024-07-12: qty 90, 90d supply, fill #0

## 2024-07-12 MED ORDER — PROPRANOLOL HCL 20 MG PO TABS
30.0000 mg | ORAL_TABLET | Freq: Every day | ORAL | 1 refills | Status: DC
  Filled 2024-07-12: qty 90, 60d supply, fill #0

## 2024-07-12 MED ORDER — TRELEGY ELLIPTA 100-62.5-25 MCG/ACT IN AEPB
1.0000 | INHALATION_SPRAY | Freq: Every day | RESPIRATORY_TRACT | 11 refills | Status: DC
  Filled 2024-07-12: qty 60, 30d supply, fill #0

## 2024-07-12 MED ORDER — BUTALBITAL-APAP-CAFFEINE 50-325-40 MG PO TABS
1.0000 | ORAL_TABLET | Freq: Four times a day (QID) | ORAL | 3 refills | Status: AC | PRN
  Filled 2024-07-12: qty 30, 8d supply, fill #0

## 2024-07-12 MED ORDER — POLYSACCHARIDE IRON COMPLEX 150 MG PO CAPS
150.0000 mg | ORAL_CAPSULE | Freq: Every day | ORAL | 1 refills | Status: DC
  Filled 2024-07-12: qty 90, 90d supply, fill #0

## 2024-07-12 MED ORDER — ONDANSETRON 8 MG PO TBDP
8.0000 mg | ORAL_TABLET | Freq: Three times a day (TID) | ORAL | 0 refills | Status: DC | PRN
  Filled 2024-07-12: qty 30, 10d supply, fill #0

## 2024-07-12 NOTE — Patient Instructions (Addendum)
 Please take all new medication as prescribed  - the iron  pill  Ok to increase the lipitor 80 mg per day  Please continue all other medications as before, and refills have been done if requested.  Please have the pharmacy call with any other refills you may need.  Please continue your efforts at being more active, low cholesterol diet, and weight control.  You are otherwise up to date with prevention measures today.  Please keep your appointments with your specialists as you may have planned  You will be contacted regarding the referral for: Gastroenterology  Your lab work was otherwise very good!  Please make an Appointment to return in 6 months, or sooner if needed

## 2024-07-12 NOTE — Assessment & Plan Note (Signed)
 With evidence for recurrent iron  deficiency, hgb stable, for niferex 150 every day, refer GI

## 2024-07-12 NOTE — Assessment & Plan Note (Signed)
 Stable , cont inhaler prn

## 2024-07-12 NOTE — Progress Notes (Signed)
 Patient ID: Rachel [name], female   DOB: Nov 09, 1965, 59 y.o.   MRN: [id number]         Chief Complaint:: yearly exam       HPI:  Rachel [name] is a 59 y.o. female overall doing ok, no overt bleeding.  Pt denies chest pain, increased sob or doe, wheezing, orthopnea, PND, increased LE swelling, palpitations, dizziness or syncope.   Pt denies polydipsia, polyuria, or new focal neuro s/s.      Wt Readings from Last 3 Encounters:  07/12/24 148 lb (67.1 kg)  06/12/24 149 lb 3.2 oz (67.7 kg)  06/08/24 148 lb (67.1 kg)   BP Readings from Last 3 Encounters:  07/12/24 118/80  06/12/24 120/70  06/08/24 116/72   Immunization History  Administered Date(s) Administered   H1N1 04/15/2008   Influenza Whole 04/22/2008, 03/14/2010   Influenza,inj,Quad PF,6+ Mos 03/13/2014, 02/28/2019, 02/26/2020   Influenza-Unspecified 03/09/2017, 02/14/2018   Moderna Sars-Covid-2 Vaccination 04/25/2020, 06/03/2020   PNEUMOCOCCAL CONJUGATE-20 10/17/2023   Pneumococcal Conjugate-13 07/09/2015   Pneumococcal Polysaccharide-23 07/08/2008, 06/25/2016   Td 10/03/2006   Tdap 06/25/2016   Zoster Recombinant(Shingrix ) 04/19/2023, 08/01/2023   Health Maintenance Due  Topic Date Due   Colonoscopy  Never done      Past Medical History:  Diagnosis Date   ALLERGIC RHINITIS 10/04/2007   Anemia 01/21/2011   Anxiety 11/15/2018   ASTHMA 08/02/2007   INHALERS ONLY IN [place]   Asthma 08/02/2007   Qualifier: Diagnosis of  By: [name]    Blood transfusion without reported diagnosis    with first child    Cervical radiculopathy 01/21/2011   Cervicogenic headache 04/27/2017   COMMON MIGRAINE 05/15/2009   Depression 01/09/2010   Qualifier: Diagnosis of  By: [name], [name]    DIABETES MELLITUS, TYPE II 08/02/2007   Dysphagia, pharyngoesophageal phase 06/12/2014   GERD 08/02/2007   HYPERLIPIDEMIA 08/02/2007   Hyperlipidemia 08/02/2007   Qualifier: Diagnosis of  By: [name]    INSOMNIA-SLEEP  DISORDER-UNSPEC 01/04/2008   INTERMITTENT VERTIGO 05/15/2009   LIBIDO, DECREASED 01/09/2010   Migraine without aura 05/15/2009   Qualifier: Diagnosis of  By: [name], [name]    Nonallopathic lesion of rib cage 12/21/2017   Nonallopathic lesion of sacral region 07/29/2015   Nonallopathic lesion of thoracic region 07/29/2015   Osteoporosis 04/25/2019   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   Rheumatoid factor positive 02/10/2016   Right knee pain 01/31/2019   Injected January 31, 2019   Type 2 diabetes mellitus with hyperglycemia, with long-term current use of insulin  (HCC) 08/02/2007   Qualifier: Diagnosis of  By: [name]    Past Surgical History:  Procedure Laterality Date   CESAREAN SECTION     x 3   CYST EXCISION     left knee   PAROTIDECTOMY  10/21/2011   Procedure: PAROTIDECTOMY;  Surgeon: Vaughan Ricker, MD;  Location: [facility];  Service: ENT;  Laterality: Left;    reports that she has never smoked. She has never been exposed to tobacco smoke. She has never used smokeless tobacco. She reports that she does not drink alcohol and does not use drugs. family history includes Asthma in her father; Cervical cancer in her maternal aunt; Diabetes in her father; Heart disease in her maternal aunt; Hypertension in her father. Allergies[1] Medications Ordered Prior to Encounter[2]      ROS:  All others reviewed and negative.  Objective  PE:  BP 118/80 (BP Location: Left Arm, Patient Position: Sitting, Cuff Size: Normal)   Pulse (!) 105   Temp 98.4 F (36.9 C) (Oral)   Ht 5' 1 (1.549 m)   Wt 148 lb (67.1 kg)   SpO2 98%   BMI 27.96 kg/m                 Constitutional: Pt appears in NAD               HENT: Head: NCAT.                Right Ear: External ear normal.                 Left Ear: External ear normal.                Eyes: . Pupils are equal, round, and reactive to light. Conjunctivae and EOM are normal               Nose: without d/c or deformity                Neck: Neck supple. Gross normal ROM               Cardiovascular: Normal rate and regular rhythm.                 Pulmonary/Chest: Effort normal and breath sounds without rales or wheezing.                Abd:  Soft, NT, ND, + BS, no organomegaly               Neurological: Pt is alert. At baseline orientation, motor grossly intact               Skin: Skin is warm. No rashes, no other new lesions, LE edema - none               Psychiatric: Pt behavior is normal without agitation   Micro: none  Cardiac tracings I have personally interpreted today:  none  Pertinent Radiological findings (summarize): none   Lab Results  Component Value Date   WBC 8.4 07/05/2024   HGB 12.1 07/05/2024   HCT 35.1 (L) 07/05/2024   PLT 332.0 07/05/2024   GLUCOSE 170 (H) 07/05/2024   CHOL 205 (H) 07/05/2024   TRIG 146.0 07/05/2024   HDL 62.60 07/05/2024   LDLDIRECT 170.0 04/07/2022   LDLCALC 113 (H) 07/05/2024   ALT 13 07/05/2024   AST 13 07/05/2024   NA 136 07/05/2024   K 3.9 07/05/2024   CL 102 07/05/2024   CREATININE 0.71 07/05/2024   BUN 13 07/05/2024   CO2 27 07/05/2024   TSH 1.55 07/05/2024   INR 1.08 07/23/2009   HGBA1C 8.1 (H) 07/05/2024   MICROALBUR 1.0 07/05/2024   Assessment/Plan:  Rachel [name] is a 59 y.o. Other or two or more races [6] female with  has a past medical history of ALLERGIC RHINITIS (10/04/2007), Anemia (01/21/2011), Anxiety (11/15/2018), ASTHMA (08/02/2007), Asthma (08/02/2007), Blood transfusion without reported diagnosis, Cervical radiculopathy (01/21/2011), Cervicogenic headache (04/27/2017), COMMON MIGRAINE (05/15/2009), Depression (01/09/2010), DIABETES MELLITUS, TYPE II (08/02/2007), Dysphagia, pharyngoesophageal phase (06/12/2014), GERD (08/02/2007), HYPERLIPIDEMIA (08/02/2007), Hyperlipidemia (08/02/2007), INSOMNIA-SLEEP DISORDER-UNSPEC (01/04/2008), INTERMITTENT VERTIGO (05/15/2009), LIBIDO, DECREASED (01/09/2010), Migraine without aura (05/15/2009), Nonallopathic lesion of rib  cage (12/21/2017), Nonallopathic lesion of sacral region (07/29/2015), Nonallopathic lesion of thoracic region (07/29/2015), Osteoporosis (04/25/2019), Patellofemoral syndrome of both knees (04/25/2019), Rheumatoid factor positive (02/10/2016), Right knee pain (  01/31/2019), and Type 2 diabetes mellitus with hyperglycemia, with long-term current use of insulin  (HCC) (08/02/2007).  Asthma Stable, cont inhaler prn  Hyperlipidemia Lab Results  Component Value Date   LDLCALC 113 (H) 07/05/2024   uncontrolled, pt to change to lipitor 80 mg   Type 2 diabetes mellitus with hyperglycemia, with long-term current use of insulin  (HCC) Lab Results  Component Value Date   HGBA1C 8.1 (H) 07/05/2024   uncontrolled, pt to continue current medical treatment glucotrol  xl 2.5 mg , toujeo  36 u every day, mounjaro  5 mg weekly, declines other change for now, f/u endo as planned   Vitamin D  deficiency Last vitamin D  Lab Results  Component Value Date   VD25OH 49.49 07/05/2024   Stable, cont oral replacement   Iron  deficiency With evidence for recurrent iron  deficiency, hgb stable, for niferex 150 every day, refer GI  Followup: Return in about 6 months (around 01/09/2025).  Lynwood Rush, MD 07/12/2024 7:58 PM  Medical Group [facility]     [1] No Active Allergies [2]  Current Outpatient Medications on File Prior to Visit  Medication Sig Dispense Refill   Accu-Chek FastClix Lancets MISC Check blood glucose twice daily 204 each 3   aspirin  81 MG EC tablet Take 81 mg by mouth daily.     Blood Glucose Monitoring Suppl (ACCU-CHEK GUIDE) w/Device KIT Use as instructed to check blood sugar 2 times daily 1 kit 0   botulinum toxin Type A  (BOTOX ) 200 units injection Inject 155 units IM Into multiple sites in the face,neck,and head every 90 days 1 each 4   carbamazepine  (TEGRETOL ) 200 MG tablet TAKE 1 TABLET TWICE DAILY FOR ONE WEEK, THEN INCREASE TO 1 TABLET  THREE TIMES DAILY. 90 tablet 3   Cholecalciferol  (VITAMIN D3) 50 MCG (2000 UT) capsule Take 2 capsules (4,000 Units total) by mouth daily. 180 capsule 3   Continuous Glucose Sensor (FREESTYLE LIBRE 3 PLUS SENSOR) MISC Use to monitor blood sugar continuously, changing sensor every 15 days. 6 each 3   cycloSPORINE  (RESTASIS ) 0.05 % ophthalmic emulsion Instill 1 drop into both eyes twice a day 180 each 3   cycloSPORINE  (RESTASIS ) 0.05 % ophthalmic emulsion Place 1 drop into both eyes 2 (two) times daily. 60 each 6   Diclofenac  Sodium (PENNSAID ) 2 % SOLN Place 2 g onto the skin 2 (two) times daily. 112 g 3   fluorometholone  (FML) 0.1 % ophthalmic suspension Place 1 drop into both eyes 4 (four) times daily. 5 mL 0   gabapentin  (NEURONTIN ) 100 MG capsule Take 2 capsules (200 mg total) by mouth at bedtime. 180 capsule 0   glipiZIDE  (GLUCOTROL  XL) 2.5 MG 24 hr tablet Take 2 tablets (5 mg total) by mouth daily with breakfast. 180 tablet 3   glucose blood (ACCU-CHEK GUIDE) test strip Use to check blood sugar 2 (two) times daily as directed 200 each 3   ibuprofen  (ADVIL ) 800 MG tablet Take 1 tablet (800 mg total) by mouth every 6 (six) hours as needed for pain. 16 tablet 0   insulin  glargine, 1 Unit Dial , (TOUJEO  SOLOSTAR) 300 UNIT/ML Solostar Pen Inject 36-38 Units into the skin daily. 15 mL 3   metoCLOPramide  (REGLAN ) 5 MG tablet Take 1 tablet (5 mg total) by mouth 3 (three) times daily 30 minutes before meals 90 tablet 1   Multiple Vitamin (MULTIVITAMIN WITH MINERALS) TABS tablet Take 1 tablet by mouth daily.     neomycin -polymyxin-hydrocortisone (CORTISPORIN ) OTIC solution  Place 4 drops into the left ear 4 (four) times daily. 10 mL 0   nitrofurantoin , macrocrystal-monohydrate, (MACROBID ) 100 MG capsule Take 1 capsule (100 mg total) by mouth every 12 (twelve) hours with food. 14 capsule 0   nystatin -triamcinolone  ointment (MYCOLOG) Apply 1 Application topically 2 (two) times daily to affected area(s). 30 g  11   phenazopyridine  (PYRIDIUM ) 200 MG tablet Take 1 tablet (200 mg total) by mouth 2 (two) times daily after a meal as needed. 6 tablet 1   prednisoLONE  acetate (PRED FORTE ) 1 % ophthalmic suspension Place 1 drop into both eyes 4 (four) times daily. 5 mL 0   tirzepatide  (MOUNJARO ) 5 MG/0.5ML Pen Inject 5 mg into the skin once a week. 2 mL 3   tiZANidine  (ZANAFLEX ) 4 MG tablet Take 1 tablet (4 mg total) by mouth every 8 (eight) hours as needed for muscle spasms. 90 tablet 5   triamcinolone  (NASACORT ) 55 MCG/ACT AERO nasal inhaler Place 2 sprays into the nose daily. 16.9 mL 12   TRYPTYR 0.003 % SOLN Apply 1 drop to eye 2 (two) times daily.     Current Facility-Administered Medications on File Prior to Visit  Medication Dose Route Frequency Provider Last Rate Last Admin   denosumab  (PROLIA ) injection 60 mg  60 mg Subcutaneous Once Delisha Peaden W, MD

## 2024-07-12 NOTE — Assessment & Plan Note (Signed)
 Lab Results  Component Value Date   HGBA1C 8.1 (H) 07/05/2024   uncontrolled, pt to continue current medical treatment glucotrol  xl 2.5 mg , toujeo  36 u every day, mounjaro  5 mg weekly, declines other change for now, f/u endo as planned

## 2024-07-12 NOTE — Assessment & Plan Note (Signed)
 Last vitamin D  Lab Results  Component Value Date   VD25OH 49.49 07/05/2024   Stable, cont oral replacement

## 2024-07-12 NOTE — Assessment & Plan Note (Signed)
 Lab Results  Component Value Date   LDLCALC 113 (H) 07/05/2024   uncontrolled, pt to change to lipitor 80 mg

## 2024-07-12 NOTE — Telephone Encounter (Signed)
 Patient called to check the status of the PA

## 2024-07-17 ENCOUNTER — Other Ambulatory Visit (HOSPITAL_COMMUNITY): Payer: Self-pay

## 2024-07-18 ENCOUNTER — Telehealth: Payer: Self-pay | Admitting: Pharmacy Technician

## 2024-07-18 ENCOUNTER — Telehealth: Payer: Self-pay

## 2024-07-18 ENCOUNTER — Other Ambulatory Visit (HOSPITAL_COMMUNITY): Payer: Self-pay

## 2024-07-18 NOTE — Telephone Encounter (Signed)
 Copied from CRM [id number]. Topic: Clinical - Prescription Issue >> Jul 18, 2024 12:16 PM China J wrote: Reason for CRM: Patient wanted to let Dr. Norleen know that all the prescriptions he sent on the 29th were not received by the pharmacy. The patient would like if it could be resent to:  [facility] [address] [place] [address] Phone: [phone] Fax: [phone] Hours: M-F 7:30am-7:00p

## 2024-07-18 NOTE — Telephone Encounter (Signed)
 PA has been submitted, and telephone encounter has been created. Please see telephone encounter dated 2.4.26.

## 2024-07-18 NOTE — Telephone Encounter (Addendum)
 Pharmacy Patient Advocate Encounter   Received notification from Pt Calls Messages that prior authorization for BOTOX  200 is required/requested.   Insurance verification completed.   The patient is insured through [employer] [facility].   Per test claim: PA required; PA submitted to above mentioned insurance via Fax Key/confirmation #/EOC [id number] Status is pending

## 2024-07-19 ENCOUNTER — Other Ambulatory Visit (HOSPITAL_COMMUNITY): Payer: Self-pay

## 2024-07-19 ENCOUNTER — Other Ambulatory Visit: Payer: Self-pay

## 2024-07-19 MED ORDER — ESOMEPRAZOLE MAGNESIUM 40 MG PO CPDR
40.0000 mg | DELAYED_RELEASE_CAPSULE | Freq: Two times a day (BID) | ORAL | 3 refills | Status: AC
  Filled 2024-07-19: qty 180, 90d supply, fill #0

## 2024-07-19 MED ORDER — MONTELUKAST SODIUM 10 MG PO TABS: 10.0000 mg | ORAL_TABLET | Freq: Every day | ORAL | 3 refills | Status: AC

## 2024-07-19 MED ORDER — LINACLOTIDE 145 MCG PO CAPS: 145.0000 ug | ORAL_CAPSULE | Freq: Every day | ORAL | 2 refills | Status: AC

## 2024-07-19 MED ORDER — PROPRANOLOL HCL 20 MG PO TABS
30.0000 mg | ORAL_TABLET | Freq: Every day | ORAL | 1 refills | Status: AC
  Filled 2024-07-19: qty 90, 60d supply, fill #0

## 2024-07-19 MED ORDER — DULOXETINE HCL 60 MG PO CPEP
60.0000 mg | ORAL_CAPSULE | Freq: Every day | ORAL | 3 refills | Status: AC
  Filled 2024-07-19: qty 90, 90d supply, fill #0

## 2024-07-19 MED ORDER — ATORVASTATIN CALCIUM 80 MG PO TABS: 80.0000 mg | ORAL_TABLET | Freq: Every day | ORAL | 3 refills | Status: AC

## 2024-07-19 MED ORDER — TRELEGY ELLIPTA 100-62.5-25 MCG/ACT IN AEPB
1.0000 | INHALATION_SPRAY | Freq: Every day | RESPIRATORY_TRACT | 11 refills | Status: AC
  Filled 2024-07-19: qty 60, 30d supply, fill #0

## 2024-07-19 MED ORDER — POLYSACCHARIDE IRON COMPLEX 150 MG PO CAPS: 150.0000 mg | ORAL_CAPSULE | Freq: Every day | ORAL | 1 refills | Status: AC

## 2024-07-19 MED ORDER — ONDANSETRON 8 MG PO TBDP: 8.0000 mg | ORAL_TABLET | Freq: Three times a day (TID) | ORAL | 0 refills | Status: AC | PRN

## 2024-07-19 NOTE — Telephone Encounter (Signed)
 Refills have been resent to the pharmacy.

## 2024-07-19 NOTE — Telephone Encounter (Signed)
 Letter received [employer] approved 06/14/24-07/19/25

## 2024-07-30 ENCOUNTER — Ambulatory Visit: Admitting: Family Medicine

## 2024-08-22 ENCOUNTER — Ambulatory Visit: Admitting: Family Medicine

## 2024-09-07 ENCOUNTER — Ambulatory Visit: Payer: Self-pay | Admitting: Neurology

## 2024-09-13 ENCOUNTER — Ambulatory Visit: Admitting: Internal Medicine

## 2024-10-18 ENCOUNTER — Ambulatory Visit: Admitting: Neurology

## 2024-10-22 ENCOUNTER — Ambulatory Visit

## 2024-10-22 ENCOUNTER — Encounter: Admitting: Internal Medicine
